# Patient Record
Sex: Female | Born: 1945
Health system: Southern US, Community
[De-identification: ages and names within clinical notes are randomized; demographics above are authoritative.]

## PROBLEM LIST (undated history)

## (undated) DIAGNOSIS — E538 Deficiency of other specified B group vitamins: Secondary | ICD-10-CM

## (undated) DIAGNOSIS — J449 Chronic obstructive pulmonary disease, unspecified: Secondary | ICD-10-CM

## (undated) DIAGNOSIS — F329 Major depressive disorder, single episode, unspecified: Secondary | ICD-10-CM

## (undated) DIAGNOSIS — M810 Age-related osteoporosis without current pathological fracture: Secondary | ICD-10-CM

## (undated) DIAGNOSIS — R0789 Other chest pain: Secondary | ICD-10-CM

## (undated) DIAGNOSIS — M545 Low back pain, unspecified: Secondary | ICD-10-CM

## (undated) DIAGNOSIS — H3530 Unspecified macular degeneration: Secondary | ICD-10-CM

## (undated) DIAGNOSIS — R413 Other amnesia: Secondary | ICD-10-CM

## (undated) DIAGNOSIS — K449 Diaphragmatic hernia without obstruction or gangrene: Secondary | ICD-10-CM

## (undated) DIAGNOSIS — F32A Depression, unspecified: Secondary | ICD-10-CM

## (undated) HISTORY — DX: Low back pain: M54.5

## (undated) HISTORY — DX: Deficiency of other specified B group vitamins: E53.8

## (undated) HISTORY — DX: Unspecified macular degeneration: H35.30

## (undated) HISTORY — DX: Age-related osteoporosis without current pathological fracture: M81.0

## (undated) HISTORY — DX: Other amnesia: R41.3

## (undated) HISTORY — PX: ABDOMINAL HYSTERECTOMY: SHX81

## (undated) HISTORY — DX: Chronic obstructive pulmonary disease, unspecified: J44.9

## (undated) HISTORY — DX: Low back pain, unspecified: M54.50

---

## 1997-12-30 ENCOUNTER — Other Ambulatory Visit: Admission: RE | Admit: 1997-12-30 | Discharge: 1997-12-30 | Payer: Self-pay | Admitting: Internal Medicine

## 1998-09-23 ENCOUNTER — Encounter: Payer: Self-pay | Admitting: Emergency Medicine

## 1998-09-23 ENCOUNTER — Emergency Department (HOSPITAL_COMMUNITY): Admission: EM | Admit: 1998-09-23 | Discharge: 1998-09-23 | Payer: Self-pay | Admitting: Emergency Medicine

## 1998-10-26 ENCOUNTER — Encounter: Payer: Self-pay | Admitting: Internal Medicine

## 1998-10-26 ENCOUNTER — Ambulatory Visit (HOSPITAL_COMMUNITY): Admission: RE | Admit: 1998-10-26 | Discharge: 1998-10-26 | Payer: Self-pay | Admitting: Internal Medicine

## 1998-10-29 ENCOUNTER — Ambulatory Visit (HOSPITAL_COMMUNITY): Admission: RE | Admit: 1998-10-29 | Discharge: 1998-10-29 | Payer: Self-pay | Admitting: Internal Medicine

## 1999-12-15 ENCOUNTER — Encounter: Payer: Self-pay | Admitting: Internal Medicine

## 1999-12-15 ENCOUNTER — Ambulatory Visit (HOSPITAL_COMMUNITY): Admission: RE | Admit: 1999-12-15 | Discharge: 1999-12-15 | Payer: Self-pay | Admitting: Internal Medicine

## 2000-04-16 ENCOUNTER — Emergency Department (HOSPITAL_COMMUNITY): Admission: EM | Admit: 2000-04-16 | Discharge: 2000-04-16 | Payer: Self-pay | Admitting: Emergency Medicine

## 2002-09-08 ENCOUNTER — Ambulatory Visit (HOSPITAL_COMMUNITY): Admission: RE | Admit: 2002-09-08 | Discharge: 2002-09-08 | Payer: Self-pay | Admitting: Family Medicine

## 2002-09-08 ENCOUNTER — Encounter: Payer: Self-pay | Admitting: Family Medicine

## 2002-09-09 ENCOUNTER — Ambulatory Visit (HOSPITAL_COMMUNITY): Admission: RE | Admit: 2002-09-09 | Discharge: 2002-09-09 | Payer: Self-pay | Admitting: Family Medicine

## 2002-09-09 ENCOUNTER — Encounter: Payer: Self-pay | Admitting: Family Medicine

## 2002-09-11 ENCOUNTER — Encounter: Payer: Self-pay | Admitting: Family Medicine

## 2002-09-11 ENCOUNTER — Ambulatory Visit (HOSPITAL_COMMUNITY): Admission: RE | Admit: 2002-09-11 | Discharge: 2002-09-11 | Payer: Self-pay | Admitting: Family Medicine

## 2003-11-25 ENCOUNTER — Ambulatory Visit (HOSPITAL_COMMUNITY): Admission: RE | Admit: 2003-11-25 | Discharge: 2003-11-25 | Payer: Self-pay | Admitting: Family Medicine

## 2005-05-10 ENCOUNTER — Ambulatory Visit (HOSPITAL_COMMUNITY): Admission: RE | Admit: 2005-05-10 | Discharge: 2005-05-10 | Payer: Self-pay | Admitting: Internal Medicine

## 2007-11-22 ENCOUNTER — Ambulatory Visit (HOSPITAL_COMMUNITY): Admission: RE | Admit: 2007-11-22 | Discharge: 2007-11-22 | Payer: Self-pay | Admitting: *Deleted

## 2010-05-01 ENCOUNTER — Encounter (HOSPITAL_BASED_OUTPATIENT_CLINIC_OR_DEPARTMENT_OTHER): Payer: Self-pay | Admitting: Internal Medicine

## 2011-01-30 ENCOUNTER — Encounter (INDEPENDENT_AMBULATORY_CARE_PROVIDER_SITE_OTHER): Payer: Self-pay | Admitting: Ophthalmology

## 2011-05-11 ENCOUNTER — Encounter (INDEPENDENT_AMBULATORY_CARE_PROVIDER_SITE_OTHER): Payer: 59 | Admitting: Ophthalmology

## 2011-05-11 DIAGNOSIS — H2510 Age-related nuclear cataract, unspecified eye: Secondary | ICD-10-CM

## 2011-05-11 DIAGNOSIS — H3530 Unspecified macular degeneration: Secondary | ICD-10-CM

## 2011-05-11 DIAGNOSIS — H43819 Vitreous degeneration, unspecified eye: Secondary | ICD-10-CM

## 2011-12-21 ENCOUNTER — Emergency Department (HOSPITAL_COMMUNITY): Payer: 59

## 2011-12-21 ENCOUNTER — Encounter (HOSPITAL_COMMUNITY): Payer: Self-pay | Admitting: Emergency Medicine

## 2011-12-21 ENCOUNTER — Emergency Department (HOSPITAL_COMMUNITY)
Admission: EM | Admit: 2011-12-21 | Discharge: 2011-12-22 | Disposition: A | Discharge status: Home / Self Care | Payer: 59 | Attending: Emergency Medicine | Admitting: Emergency Medicine

## 2011-12-21 DIAGNOSIS — F329 Major depressive disorder, single episode, unspecified: Secondary | ICD-10-CM | POA: Insufficient documentation

## 2011-12-21 DIAGNOSIS — F3289 Other specified depressive episodes: Secondary | ICD-10-CM | POA: Insufficient documentation

## 2011-12-21 DIAGNOSIS — F17200 Nicotine dependence, unspecified, uncomplicated: Secondary | ICD-10-CM

## 2011-12-21 DIAGNOSIS — R079 Chest pain, unspecified: Secondary | ICD-10-CM

## 2011-12-21 DIAGNOSIS — I2510 Atherosclerotic heart disease of native coronary artery without angina pectoris: Secondary | ICD-10-CM | POA: Insufficient documentation

## 2011-12-21 HISTORY — DX: Other chest pain: R07.89

## 2011-12-21 HISTORY — DX: Diaphragmatic hernia without obstruction or gangrene: K44.9

## 2011-12-21 HISTORY — DX: Depression, unspecified: F32.A

## 2011-12-21 HISTORY — DX: Major depressive disorder, single episode, unspecified: F32.9

## 2011-12-21 LAB — POCT I-STAT TROPONIN I
Troponin i, poc: 0 ng/mL (ref 0.00–0.08)
Troponin i, poc: 0 ng/mL (ref 0.00–0.08)

## 2011-12-21 LAB — BASIC METABOLIC PANEL
Calcium: 9.4 mg/dL (ref 8.4–10.5)
GFR calc Af Amer: 82 mL/min — ABNORMAL LOW (ref >90)
Potassium: 4 mEq/L (ref 3.5–5.1)

## 2011-12-21 LAB — CBC
HCT: 35.5 % — ABNORMAL LOW (ref 36.0–46.0)
MCV: 98.6 fL (ref 78.0–100.0)
Platelets: 158 10*3/uL (ref 150–400)
WBC: 7.1 10*3/uL (ref 4.0–10.5)

## 2011-12-21 MED ORDER — ASPIRIN 325 MG PO TABS
325.0000 mg | ORAL_TABLET | ORAL | Status: AC
  Administered 2011-12-21: 325 mg via ORAL
  Filled 2011-12-21: qty 1

## 2011-12-21 MED ORDER — NICOTINE 14 MG/24HR TD PT24
14.0000 mg | MEDICATED_PATCH | Freq: Once | TRANSDERMAL | Status: DC
  Filled 2011-12-21: qty 1

## 2011-12-21 NOTE — ED Notes (Signed)
Reports got up this AM and had sharp pain in midsternum, and has been having episodes since--describes it as pressure; reports a "little bit" of nausea, SOB; lungs clear

## 2011-12-21 NOTE — ED Notes (Addendum)
Report called to CDU RN Damon. Pt ambulated to CDU #7 without assistance but with supervision. Gait steady. Pt remains pain free.

## 2011-12-21 NOTE — ED Provider Notes (Signed)
66 year old female had 2 episodes of chest pain today. Both were sharp and relatively brief. There is no radiation and no associated dyspnea, nausea, diaphoresis. She does have significant cardiac risk factors of cigarette smoking and strongly positive family history of coronary artery disease. Exam is unremarkable. She is clear lungs and heart has regular rate and rhythm without murmur. ECG shows some nonspecific ST depression, but no prior ECG is available for comparison. Because of her risk factors and abnormal ECG, I feel that she should be admitted to CDU to get a coronary CT scan done in the morning. Patient is agreeable.   Date: 12/21/2011  Rate: 94  Rhythm: normal sinus rhythm and premature ventricular contractions (PVC)  QRS Axis: normal  Intervals: normal  ST/T Wave abnormalities: ST depression in the inferior and anterolateral leads  Conduction Disutrbances:none  Narrative Interpretation: ST depression in the inferior and anterolateral leads. No prior ECG available for comparison.  Old EKG Reviewed: none available    Dione Booze, MD 12/21/11 2315

## 2011-12-21 NOTE — ED Provider Notes (Addendum)
History     CSN: [id number]  Arrival date & time 12/21/11  1613   None     Chief Complaint  Patient presents with  . Chest Pain    (Consider location/radiation/quality/duration/timing/severity/associated sxs/prior treatment) HPI  66 year old female who presents with acute onset chest pain that started this morning at approximately 8:30 AM when she was awakening. She describes it as sharp and centrally located. She initially thought it was indigestion. It was severe for matter of a few seconds and then eased off spontaneously. Thereafter, she had central chest pressure that persisted and she rated it a 2/10. It was not associated with nausea vomiting shortness of breath or diaphoresis. She went about her day doing chores around the house including sweeping, laundry, and cooking which did not exacerbate the pain. She said the pain came back briefly this afternoon, so she called her son-in-law who is a Company secretary for his opinion.he checked her blood pressure and heart was elevated and she need to get an EEG for further evaluation. The patient has not been seen by a physician in about a year and a half. She denies a history of coronary artery disease or heart attack, but notes that she was sent for a stress test before. She does not remember the results of the stress test. She denies a history of hypertension, hyperlipidemia, and diabetes. She does have a greater than 50-pack-year history of smoking and 4 siblings with coronary artery disease, but none with an early MI. The only medications that she takes on a daily basis are Celexa and BC powder. She takes 3-4 BC powders a day.  Past Medical History  Diagnosis Date  . Depression   . Hiatal hernia     Past Surgical History  Procedure Date  . Abdominal hysterectomy     History reviewed. No pertinent family history.  History  Substance Use Topics  . Smoking status: Current Every Day Smoker -- 0.5 packs/day    Types: Cigarettes  . Smokeless  tobacco: Not on file  . Alcohol Use: Yes     rarely    OB History    Grav Para Term Preterm Abortions TAB SAB Ect Mult Living                  Review of Systems  Constitutional: Negative.   HENT: Negative.   Eyes: Negative.   Respiratory: Negative.  Negative for shortness of breath.   Cardiovascular: Positive for chest pain.  Gastrointestinal: Negative.   Genitourinary: Negative.   Musculoskeletal: Negative.   Neurological: Negative.     Allergies  Codeine  Home Medications   Current Outpatient Rx  Name Route Sig Dispense Refill  . BC HEADACHE POWDER PO Oral Take 1 Package by mouth daily.    Marland Kitchen CITALOPRAM HYDROBROMIDE 20 MG PO TABS Oral Take 60 mg by mouth daily.      BP 120/72  Pulse 65  Temp 97.8 F (36.6 C) (Oral)  Resp 25  SpO2 99%  Physical Exam  Constitutional: She is oriented to person, place, and time. She appears well-developed and well-nourished. No distress.       Thin body habitus, appears older than stated age  HENT:  Head: Normocephalic and atraumatic.  Mouth/Throat: No oropharyngeal exudate.  Eyes: Conjunctivae normal are normal. Pupils are equal, round, and reactive to light.  Neck: Normal range of motion.  Cardiovascular: Normal rate, regular rhythm and normal heart sounds.   Pulmonary/Chest: No respiratory distress. She has wheezes. She exhibits no  tenderness.  Abdominal: Soft. Bowel sounds are normal.  Neurological: She is alert and oriented to person, place, and time. She has normal reflexes.  Skin: Skin is warm and dry. She is not diaphoretic.  Psychiatric: She has a normal mood and affect. Her behavior is normal.    ED Course  Procedures (including critical care time)  Labs Reviewed  CBC - Abnormal; Notable for the following:    RBC 3.60 (*)     Hemoglobin 11.9 (*)     HCT 35.5 (*)     All other components within normal limits  BASIC METABOLIC PANEL - Abnormal; Notable for the following:    GFR calc non Af Amer 71 (*)     GFR  calc Af Amer 82 (*)     All other components within normal limits  POCT I-STAT TROPONIN I  TROPONIN I   Dg Chest 2 View  12/21/2011  *RADIOLOGY REPORT*  Clinical Data: Chest pain off and on for 3 weeks.  History of smoking.  CHEST - 2 VIEW  Comparison: 05/10/2005  Findings: Lungs are hyperinflated.  There is perihilar peribronchial thickening.  No focal consolidations or pleural effusions are identified. There is biapical pleural parenchymal scarring.  Mild degenerative changes are seen in the spine.  IMPRESSION: Hyperinflation and bronchitic changes.   Original Report Authenticated By: Patterson Hammersmith, M.D.     Date: 12/21/2011  Rate: 66   Rhythm: normal sinus rhythm  QRS Axis: normal  Intervals: normal  ST/T Wave abnormalities: ST depressions diffusely  Conduction Disutrbances:none  Narrative Interpretation: Abnormal ECG with non specific ST changes  Old EKG Reviewed: none available     1. Chest pain       MDM  66 year old F who presents with atypical chest pain and risk factors for CAD including a positive family history and a 50 pack year history of smoking with COPD who needs further work up for CAD. Therefore, she will be transferred to the CDU for further evaluation.         Garnetta Buddy, MD 12/21/11 [phone]

## 2011-12-22 ENCOUNTER — Emergency Department (HOSPITAL_COMMUNITY): Payer: 59

## 2011-12-22 ENCOUNTER — Encounter (HOSPITAL_COMMUNITY): Payer: Self-pay | Admitting: Nurse Practitioner

## 2011-12-22 DIAGNOSIS — R079 Chest pain, unspecified: Secondary | ICD-10-CM

## 2011-12-22 MED ORDER — METOPROLOL TARTRATE 25 MG PO TABS
50.0000 mg | ORAL_TABLET | Freq: Once | ORAL | Status: AC
  Administered 2011-12-22: 50 mg via ORAL
  Filled 2011-12-22: qty 2

## 2011-12-22 MED ORDER — IOHEXOL 350 MG/ML SOLN
80.0000 mL | Freq: Once | INTRAVENOUS | Status: AC | PRN
  Administered 2011-12-22: 80 mL via INTRAVENOUS

## 2011-12-22 NOTE — ED Provider Notes (Signed)
I saw and evaluated the patient, reviewed the resident's note and I agree with the findings and plan.                      s  Dione Booze, MD 12/22/11 [phone]

## 2011-12-22 NOTE — ED Notes (Signed)
Per verbal order by Dr. Charlett Nose, pt given metoprolol 5mg  IVP and NTG 0.4mg  SL.

## 2011-12-22 NOTE — ED Provider Notes (Signed)
66 year old female current smoker with a strong family history of CAD transferred to the CDU on chest pain protocol.  Patient reports brief history of intermittent sharp chest pain that occurred earlier yesterday and states she is currently CP free. ECG was positive for ST depression in the inferior and anteriorlateral leads, negative chest x-ray, negative troponin x2.  Patient disposition pending results of CT angiogram.   12:53 PM  CTAngio CHEST IMPRESSION: 1. Positive for coronary artery disease. The patient's total coronary artery calcium score is 103.3, which is 80th percentile for patient's matched age and gender. 2. Moderate calcified plaque in the proximal to mid LAD with less than 50% luminal narrowing. Focal mixed plaque at the origin of the RCA appears more significant, with luminal narrowing 50-75%, likely closer to 50%. 3. Right coronary artery dominance.  Consult Cardiology for dispo plan.   1:40 PM Per Cardiologist pt will be dc w PCP follow up. Atypical chest discomfort, mild ST changes, and dx of stable CAD on CTAngio.  Pt has been advised start a PPI and return to the ED is CP becomes exertional, associated with diaphoresis or nausea, radiates to left jaw/arm, worsens or becomes concerning in any way. Pt appears reliable for follow up and is agreeable to discharge.   Case has been discussed with and seen by Dr. Daleen Squibb who agrees with the above plan to discharge.     Jaci Carrel, [place] 12/22/11 1346

## 2011-12-22 NOTE — Progress Notes (Signed)
Patient ID: Laurie James, female   DOB: 07/06/1945, 66 y.o.   MRN: [id number]   CARDIOLOGY CONSULT NOTE  Patient ID: Laurie James MRN: [id number], DOB/AGE: Nov 06, 1945   Admit date: 12/21/2011 Date of Consult: 12/22/2011   Primary Physician: Sallye Ober MD Primary Cardiologist: none  Pt. Profile  Laurie James is a 65 year old white female that I was asked to see for substernal chest pain.  Problem List  Past Medical History  Diagnosis Date  . Depression   . Hiatal hernia   . Midsternal chest pain     a. 12/2011 Cardiac CTA Ca++ score of 103.3 (80th %), LAD <50p/m, RCA 50-75.    Past Surgical History  Procedure Date  . Abdominal hysterectomy      Allergies  Allergies  Allergen Reactions  . Codeine Nausea And Vomiting    HPI  She began to have sharp localized midsternal chest discomfort yesterday. Her last episode of pain which just today around 2:00. She was advised by the fire department to come to the emergency room.  She does have dyspnea on exertion with household chores. She denies any angina. Discomfort she had did not radiate, was not associated with any other symptoms such as nausea or vomiting. It was made worse by bending over. She denies any dysphagia. She denies any hematemesis or melena.  EKG showed nonspecific ST segment changes inferior laterally which I read as normal. Troponin was negative. Cardiac CT demonstrated a 50% stenosis and a dominant right coronary artery and a 50% mid LAD stenosis. Coronary calcification score was  103.   Cardiac risk factors include age, sex, 55-pack-year history of smoking since age 43. She smokes about a half-pack to pack a day depending on her stress level. She has never tried to quit smoking. Her daughter quit with Chantix.  She denies orthopnea, PND or edema.    Inpatient Medications     . aspirin  325 mg Oral STAT  . metoprolol tartrate  50 mg Oral Once  . nicotine  14 mg Transdermal Once    Family  History History reviewed. No pertinent family history.   Social History History   Social History  . Marital Status: Married    Spouse Name: N/A    Number of Children: N/A  . Years of Education: N/A   Occupational History  . Not on file.   Social History Main Topics  . Smoking status: Current Every Day Smoker -- 0.5 packs/day    Types: Cigarettes  . Smokeless tobacco: Not on file  . Alcohol Use: Yes     rarely  . Drug Use: No  . Sexually Active:    Other Topics Concern  . Not on file   Social History Narrative  . No narrative on file     Review of Systems  General:  No chills, fever, night sweats or weight changes.  Cardiovascular:  , dyspnea on exertion, edema, orthopnea, palpitations, paroxysmal nocturnal dyspnea. Dermatological: No rash, lesions/masses Respiratory: No cough, dyspnea Urologic: No hematuria, dysuria Abdominal:   No nausea, vomiting, diarrhea, bright red blood per rectum, melena, or hematemesis Neurologic:  No visual changes, wkns, changes in mental status. All other systems reviewed and are otherwise negative except as noted above.  Physical Exam  Blood pressure 100/62, pulse 59, temperature 98.7 F (37.1 C), temperature source Oral, resp. rate 18, height 5' (1.524 m), weight 129 lb (58.514 kg), SpO2 98.00%.  General: Pleasant, NAD Psych: Normal affect. Neuro: Alert and oriented X 3.  Moves all extremities spontaneously. HEENT: Normal  Neck: Supple without bruits or JVD. Lungs:  Resp regular and unlabored, CTA. Heart: RRR no s3, s4, or murmurs. Abdomen: Soft, non-tender, non-distended, BS + x 4.  Extremities: No clubbing, cyanosis or edema.diminished in the lower extremities but present. Delayed capillary refill. and equal bilaterally.  Labs  No results found for this basename: CKTOTAL:4,CKMB:4,TROPONINI:4 in the last 72 hours Lab Results  Component Value Date   WBC 7.1 12/21/2011   HGB 11.9* 12/21/2011   HCT 35.5* 12/21/2011   MCV 98.6  12/21/2011   PLT 158 12/21/2011    Lab 12/21/11 1626  NA 141  K 4.0  CL 107  CO2 22  BUN 15  CREATININE 0.84  CALCIUM 9.4  PROT --  BILITOT --  ALKPHOS --  ALT --  AST --  GLUCOSE 93   No results found for this basename: CHOL, HDL, LDLCALC, TRIG   No results found for this basename: DDIMER    Radiology/Studies  Dg Chest 2 View  12/21/2011  *RADIOLOGY REPORT*  Clinical Data: Chest pain off and on for 3 weeks.  History of smoking.  CHEST - 2 VIEW  Comparison: 05/10/2005  Findings: Lungs are hyperinflated.  There is perihilar peribronchial thickening.  No focal consolidations or pleural effusions are identified. There is biapical pleural parenchymal scarring.  Mild degenerative changes are seen in the spine.  IMPRESSION: Hyperinflation and bronchitic changes.   Original Report Authenticated By: Patterson Hammersmith, M.D.    Ct Heart Morp W/cta Cor W/score W/ca W/cm &/or Wo/cm  12/22/2011  *RADIOLOGY REPORT*  INDICATION:  There are midsternal pain.  CT ANGIOGRAPHY OF THE HEART, CORONARY ARTERY, STRUCTURE, AND MORPHOLOGY  CONTRAST: 80mL OMNIPAQUE IOHEXOL 350 MG/ML SOLN  COMPARISON:  None.  TECHNIQUE:  CT angiography of the coronary vessels was performed on a 256 channel system using prospective ECG gating.  A scout and noncontrast exam (for calcium scoring) were performed.  Circulation time was measured using a test bolus.  Coronary CTA was performed with sub mm slice collimation during portions of the cardiac cycle after prior injection of iodinated contrast.  Imaging post processing was performed on an independent workstation creating multiplanar and 3-D images, and quantitative analysis of the heart and coronary arteries.  Note that this exam targets the heart and the chest was not imaged in its entirety.  PREMEDICATION: Lopressor 50 mg, P.O. Lopressor 5 mg, IV Nitroglycerin 0.4 mcg, sublingual.  FINDINGS: Technical quality:  Excellent.  Heart rate:  54  CORONARY ARTERIES: Left main coronary  artery:  Normal caliber vessel.  No intrinsic disease. Left anterior descending:  Moderate calcified plaque at the origin and beyond the origin of first diagonal branch. This appears to be narrowing the lumen less than 50%. Short intramyocardial course of the LAD within its mid to distal segment. Left circumflex:  Gives rise to a proximal ramus branch.  No intrinsic disease or stenosis. Right coronary artery:  Moderate focal mixed plaque at the origin. This narrows the lumen approximately 50% in diameter.  Otherwise widely patent. Posterior descending artery:  Widely patent. Dominance:  Right  CORONARY CALCIUM: Total Agatston Score:  103.3 MESA database percentile:  80th  CARDIAC MEASUREMENTS: Interventricular septum (6 - 12 mm):  8 mm LV posterior Merced Brougham (6 - 12 mm):  9 mm LV diameter in diastole (35 - 52 mm):  39 mm  AORTA AND PULMONARY MEASUREMENTS: Aortic root (21 - 40 mm):  21 mm  at the annulus             30 mm  at the sinuses of Valsalva             27 mm  at the sinotubular junction Ascending aorta ( <  40 mm):  29 mm Descending aorta ( <  40 mm):  22 mm Main pulmonary artery:  ( <  30 mm):  23 mm  EXTRACARDIAC FINDINGS: Changes of mild peripheral interstitial prominence, likely early fibrosis.  Scarring in the lingula and right middle lobe.  This can be seen with old Mycobacterium avium infections.  No effusions or acute findings.  No adenopathy lesion in the visualized lower mediastinum or hila.  IMPRESSION:  1.  Positive for coronary artery disease.  The patient's total coronary artery calcium score is 103.3, which is 80th percentile for patient's matched age and gender. 2.  Moderate calcified plaque in the proximal to mid LAD with less than 50% luminal narrowing.  Focal mixed plaque at the origin of the RCA appears more significant, with luminal narrowing 50-75%, likely closer to 50%. 3.  Right coronary artery dominance.  Report was called to Lizette at 12:40 p.m. on 12/22/2011.   Original  Report Authenticated By: Cyndie Chime, M.D.     ECG  Normal sinus rhythm, essentially normal EKG  ASSESSMENT AND PLAN  66 year old white female with clinically occult coronary artery disease. As I discussed with her daughter, it is not surprising that she has coronary disease to this degree considering her risk factors particularly smoking. Her symptoms are clearly not coronary. I suspect she has some reflux.  I spent 30 minutes challenging her and counseled her to stop smoking. I recommended that she follow Dr. Nedra Hai, her primary care physician, for fasting lipids and most likely the need for a statin with an LDL goal of 70 or less. In addition I've advised an enteric-coated aspirin 81 mg a day. I've asked her to ask her primary care physician for Chantix if she decides to stop smoking. Her daughter says she'll reinforce this.  There is no need for cardiology followup. I do not think a stress Myoview or any other functional study will be of any benefit. There is also no clinical indication for this.   Signed, Valera Castle, MD 12/22/2011, 1:42 PM

## 2011-12-22 NOTE — ED Notes (Signed)
Pt transported to CT scanner via wheelchair with RN.

## 2011-12-22 NOTE — ED Notes (Signed)
Family at bedside. 

## 2011-12-24 NOTE — ED Provider Notes (Signed)
Medical screening examination/treatment/procedure(s) were performed by non-physician practitioner and as supervising physician I was immediately available for consultation/collaboration.   Laray Anger, DO 12/24/11 1723

## 2013-02-28 ENCOUNTER — Other Ambulatory Visit (HOSPITAL_COMMUNITY): Payer: Self-pay | Admitting: Internal Medicine

## 2013-02-28 DIAGNOSIS — Z1231 Encounter for screening mammogram for malignant neoplasm of breast: Secondary | ICD-10-CM

## 2013-03-18 ENCOUNTER — Ambulatory Visit (HOSPITAL_COMMUNITY)
Admission: RE | Admit: 2013-03-18 | Discharge: 2013-03-18 | Disposition: A | Discharge status: Home / Self Care | Payer: 59 | Source: Ambulatory Visit | Attending: Internal Medicine | Admitting: Internal Medicine

## 2013-03-18 DIAGNOSIS — Z1231 Encounter for screening mammogram for malignant neoplasm of breast: Secondary | ICD-10-CM

## 2013-08-04 ENCOUNTER — Other Ambulatory Visit: Payer: Self-pay | Admitting: Internal Medicine

## 2013-08-04 ENCOUNTER — Ambulatory Visit
Admission: RE | Admit: 2013-08-04 | Discharge: 2013-08-04 | Disposition: A | Discharge status: Home / Self Care | Payer: 59 | Source: Ambulatory Visit | Attending: Internal Medicine | Admitting: Internal Medicine

## 2013-08-04 DIAGNOSIS — R0789 Other chest pain: Secondary | ICD-10-CM

## 2013-08-04 DIAGNOSIS — R42 Dizziness and giddiness: Secondary | ICD-10-CM

## 2013-08-06 ENCOUNTER — Other Ambulatory Visit: Payer: 59

## 2013-08-06 ENCOUNTER — Ambulatory Visit
Admission: RE | Admit: 2013-08-06 | Discharge: 2013-08-06 | Disposition: A | Discharge status: Home / Self Care | Payer: 59 | Source: Ambulatory Visit | Attending: Internal Medicine | Admitting: Internal Medicine

## 2013-08-06 ENCOUNTER — Other Ambulatory Visit: Payer: Self-pay | Admitting: Internal Medicine

## 2013-08-06 DIAGNOSIS — R42 Dizziness and giddiness: Secondary | ICD-10-CM

## 2013-12-04 ENCOUNTER — Encounter (INDEPENDENT_AMBULATORY_CARE_PROVIDER_SITE_OTHER): Payer: 59 | Admitting: Ophthalmology

## 2013-12-04 DIAGNOSIS — H2510 Age-related nuclear cataract, unspecified eye: Secondary | ICD-10-CM

## 2013-12-04 DIAGNOSIS — H43819 Vitreous degeneration, unspecified eye: Secondary | ICD-10-CM

## 2013-12-04 DIAGNOSIS — H353290 Exudative age-related macular degeneration, unspecified eye, stage unspecified: Secondary | ICD-10-CM

## 2014-02-04 ENCOUNTER — Ambulatory Visit: Payer: 59 | Admitting: Cardiovascular Disease

## 2014-03-02 ENCOUNTER — Ambulatory Visit: Payer: 59 | Admitting: Cardiovascular Disease

## 2014-04-22 NOTE — Progress Notes (Signed)
Patient ID: Laurie James, female   DOB: [birth year], 69 y.o.   MRN: [id number]    69 yo referred by Dr Leticia Clas for CAD   EXTRACARDIAC FINDINGS:  Cardiac CT 9/13   Changes of mild peripheral interstitial prominence, likely early fibrosis. Scarring in the lingula and right middle lobe. This can be seen with old Mycobacterium avium infections. No effusions or acute findings. No adenopathy lesion in the visualized lower mediastinum or hila.  IMPRESSION:  1. Positive for coronary artery disease. The patient's total coronary artery calcium score is 103.3, which is 80th percentile for patient's matched age and gender. 2. Moderate calcified plaque in the proximal to mid LAD with less than 50% luminal narrowing. Focal mixed plaque at the origin of the RCA appears more significant, with luminal narrowing 50-75%, likely closer to 50%. 3. Right coronary artery dominance.  Still smoking  Has some pressure and dyspnea in her chest with activity No rest pain.  Mild cough   ROS: Denies fever, malais, weight loss, blurry vision, decreased visual acuity, cough, sputum, SOB, hemoptysis, pleuritic pain, palpitaitons, heartburn, abdominal pain, melena, lower extremity edema, claudication, or rash.  All other systems reviewed and negative   General: Affect appropriate Chronically ill thin bronchitic female  HEENT: normal Neck supple with no adenopathy JVP normal no bruits no thyromegaly Lungs clear with no wheezing and good diaphragmatic motion Heart:  S1/S2 no murmur,rub, gallop or click PMI normal Abdomen: benighn, BS positve, no tenderness, no AAA no bruit.  No HSM or HJR Distal pulses intact with no bruits No edema Neuro non-focal Skin warm and dry No muscular weakness  Medications Current Outpatient Prescriptions  Medication Sig Dispense Refill  . Aspirin-Salicylamide-Caffeine (BC HEADACHE POWDER PO) Take 1 Package by mouth daily.    . citalopram (CELEXA) 20 MG tablet Take  60 mg by mouth daily.     No current facility-administered medications for this visit.    Allergies Codeine  Family History: No family history on file.  Social History: History   Social History  . Marital Status: Married    Spouse Name: N/A    Number of Children: N/A  . Years of Education: N/A   Occupational History  . Not on file.   Social History Main Topics  . Smoking status: Current Every Day Smoker -- 0.50 packs/day    Types: Cigarettes  . Smokeless tobacco: Not on file  . Alcohol Use: Yes     Comment: rarely  . Drug Use: No  . Sexual Activity: Not on file   Other Topics Concern  . Not on file   Social History Narrative    Past Surgical History  Procedure Laterality Date  . Abdominal hysterectomy      Past Medical History  Diagnosis Date  . Depression   . Hiatal hernia   . Midsternal chest pain     a. 12/2011 Cardiac CTA Ca++ score of 103.3 (80th %), LAD <50p/m, RCA 50-75.    Electrocardiogram:  12/23/11  SR rate 94 nonspecific ST changes   Assessment and Plan

## 2014-04-23 ENCOUNTER — Encounter: Payer: Self-pay | Admitting: *Deleted

## 2014-04-24 ENCOUNTER — Ambulatory Visit (INDEPENDENT_AMBULATORY_CARE_PROVIDER_SITE_OTHER): Payer: 59 | Admitting: Cardiovascular Disease

## 2014-04-24 ENCOUNTER — Encounter: Payer: Self-pay | Admitting: Cardiovascular Disease

## 2014-04-24 VITALS — BP 116/78 | HR 72 | Ht 60.0 in | Wt 112.0 lb

## 2014-04-24 DIAGNOSIS — R0789 Other chest pain: Secondary | ICD-10-CM

## 2014-04-24 DIAGNOSIS — J410 Simple chronic bronchitis: Secondary | ICD-10-CM

## 2014-04-24 DIAGNOSIS — I2510 Atherosclerotic heart disease of native coronary artery without angina pectoris: Secondary | ICD-10-CM | POA: Insufficient documentation

## 2014-04-24 DIAGNOSIS — I2583 Coronary atherosclerosis due to lipid rich plaque: Secondary | ICD-10-CM

## 2014-04-24 DIAGNOSIS — J449 Chronic obstructive pulmonary disease, unspecified: Secondary | ICD-10-CM | POA: Insufficient documentation

## 2014-04-24 DIAGNOSIS — J441 Chronic obstructive pulmonary disease with (acute) exacerbation: Secondary | ICD-10-CM | POA: Insufficient documentation

## 2014-04-24 NOTE — Assessment & Plan Note (Signed)
Needs functional study given known moderate 2V CAD and exertional symptoms  Low threshold to cath  ASA for now  [facility] as she cannot walk on treadmill

## 2014-04-24 NOTE — Assessment & Plan Note (Signed)
F/U primary may benefit from walk test and pulmonary referral  No active wheezing

## 2014-04-24 NOTE — Patient Instructions (Signed)
Your physician wants you to follow-up in:   6 MONTHS WITH DR NISHAN' You will receive a reminder letter in the mail two months in advance. If you don't receive a letter, please call our office to schedule the follow-up appointment. Your physician recommends that you continue on your current medications as directed. Please refer to the Current Medication list given to you today.   Your physician has requested that you have a lexiscan myoview. For further information please visit [email or web address]. Please follow instruction sheet, as given.  

## 2014-04-28 ENCOUNTER — Ambulatory Visit (HOSPITAL_COMMUNITY): Payer: 59 | Attending: Cardiology | Admitting: Radiology

## 2014-04-28 DIAGNOSIS — Z720 Tobacco use: Secondary | ICD-10-CM | POA: Diagnosis not present

## 2014-04-28 DIAGNOSIS — J449 Chronic obstructive pulmonary disease, unspecified: Secondary | ICD-10-CM | POA: Insufficient documentation

## 2014-04-28 DIAGNOSIS — R0609 Other forms of dyspnea: Secondary | ICD-10-CM | POA: Diagnosis not present

## 2014-04-28 DIAGNOSIS — R079 Chest pain, unspecified: Secondary | ICD-10-CM | POA: Insufficient documentation

## 2014-04-28 DIAGNOSIS — I2510 Atherosclerotic heart disease of native coronary artery without angina pectoris: Secondary | ICD-10-CM | POA: Diagnosis not present

## 2014-04-28 DIAGNOSIS — R0789 Other chest pain: Secondary | ICD-10-CM

## 2014-04-28 MED ORDER — REGADENOSON 0.4 MG/5ML IV SOLN
0.4000 mg | Freq: Once | INTRAVENOUS | Status: AC
  Administered 2014-04-28: 0.4 mg via INTRAVENOUS

## 2014-04-28 MED ORDER — TECHNETIUM TC 99M SESTAMIBI GENERIC - CARDIOLITE
33.0000 | Freq: Once | INTRAVENOUS | Status: AC | PRN
  Administered 2014-04-28: 33 via INTRAVENOUS

## 2014-04-28 MED ORDER — TECHNETIUM TC 99M SESTAMIBI GENERIC - CARDIOLITE
11.0000 | Freq: Once | INTRAVENOUS | Status: AC | PRN
  Administered 2014-04-28: 11 via INTRAVENOUS

## 2014-04-28 NOTE — Progress Notes (Signed)
[facility] SITE 3 NUCLEAR MED [address] [place], [place] [address] [phone]    Cardiology Nuclear Med Study  Laurie James is a 69 y.o. female     MRN : [id number]     DOB: 1946/03/21  Procedure Date: 04/28/2014  Nuclear Med Background Indication for Stress Test:  Evaluation for Ischemia History:  CAD, COPD Cardiac Risk Factors: Smoker  Symptoms:  Chest Pain and DOE   Nuclear Pre-Procedure Caffeine/Decaff Intake:  None> 12 hrs NPO After: 8:30pm   Lungs:  clear O2 Sat: 97% on room air. IV 0.9% NS with Angio Cath:  20g  IV Site: R Antecubital x 1, tolerated well IV Started by:  Irean Hong, RN  Chest Size (in):  36 Cup Size: B  Height: 5' (1.524 m)  Weight:  107 lb (48.535 kg)  BMI:  Body mass index is 20.9 kg/(m^2). Tech Comments:  N/A    Nuclear Med Study 1 or 2 day study: 1 day  Stress Test Type:  Lexiscan  Reading MD: N/A  Order Authorizing Provider:  Charlton Haws, MD  Resting Radionuclide: Technetium 82m Sestamibi  Resting Radionuclide Dose: 11.0 mCi   Stress Radionuclide:  Technetium 50m Sestamibi  Stress Radionuclide Dose: 33.0 mCi           Stress Protocol Rest HR: 81 Stress HR: 115  Rest BP: 131/76 Stress BP: 115/66  Exercise Time (min): n/a METS: n/a   Predicted Max HR: 152 bpm % Max HR: 75.66 bpm Rate Pressure Product: 50518   Dose of Adenosine (mg):  n/a Dose of Lexiscan: 0.4 mg  Dose of Atropine (mg): n/a Dose of Dobutamine: n/a mcg/kg/min (at max HR)  Stress Test Technologist: Nelson Chimes, BS-ES  Nuclear Technologist:  Kerby Nora, CNMT     Rest Procedure:  Myocardial perfusion imaging was performed at rest 45 minutes following the intravenous administration of Technetium 38m Sestamibi. Rest ECG: NSR, short PR interval, nonspecific ST changes.  Stress Procedure:  The patient received IV Lexiscan 0.4 mg over 15-seconds.  Technetium 61m Sestamibi injected at 30-seconds.  Quantitative spect images were obtained after a 45  minute delay. During the infusion of Lexiscan the patient complained of stomach feeling funny.  These symptoms began to resolve in recovery.  Stress ECG: Borderline ST depression in the inferolateral leads felt to be nondiagnostic.  QPS Raw Data Images:  Normal; no motion artifact. Stress Images:  There is decreased uptake in the anterior wall. Rest Images:  There is decreased uptake in the anterior wall. Subtraction (SDS):  No evidence of ischemia. Transient Ischemic Dilatation (Normal <1.22):  1.10 Lung/Heart Ratio (Normal <0.45):  0.33  Quantitative Gated Spect Images QGS EDV:  47 ml QGS ESV:  16 ml  Impression Exercise Capacity:  Lexiscan with no exercise. BP Response:  Normal blood pressure response. Clinical Symptoms:  No chest pain or dyspnea. ECG Impression:  Borderline ST depression in the inferolateral leads felt to be nondiagnostic. Comparison with Prior Nuclear Study: No previous nuclear study performed  Overall Impression:  Low risk stress nuclear study with a small, mild, fixed anterior defect consistent with soft tissue attenuation; no ischemia.  LV Ejection Fraction: 67%.  LV Wall Motion:  NL LV Function; NL Wall Motion  [name]

## 2014-06-26 ENCOUNTER — Telehealth: Payer: Self-pay | Admitting: Internal Medicine

## 2014-06-30 NOTE — Telephone Encounter (Signed)
error 

## 2014-12-08 ENCOUNTER — Ambulatory Visit: Payer: 59 | Admitting: Cardiovascular Disease

## 2014-12-09 ENCOUNTER — Ambulatory Visit (INDEPENDENT_AMBULATORY_CARE_PROVIDER_SITE_OTHER): Payer: 59 | Admitting: Ophthalmology

## 2014-12-09 DIAGNOSIS — H3531 Nonexudative age-related macular degeneration: Secondary | ICD-10-CM | POA: Diagnosis not present

## 2014-12-09 DIAGNOSIS — H43813 Vitreous degeneration, bilateral: Secondary | ICD-10-CM | POA: Diagnosis not present

## 2015-02-14 NOTE — Progress Notes (Signed)
Patient ID: Laurie James, female   DOB: May 11, 1945, 69 y.o.   MRN: [id number]    69 yo referred by Dr Leticia Clas for CAD 04/2014   EXTRACARDIAC FINDINGS:  Cardiac CT 9/13   Changes of mild peripheral interstitial prominence, likely early fibrosis. Scarring in the lingula and right middle lobe. This can be seen with old Mycobacterium avium infections. No effusions or acute findings. No adenopathy lesion in the visualized lower mediastinum or hila.  IMPRESSION:  1. Positive for coronary artery disease. The patient's total coronary artery calcium score is 103.3, which is 80th percentile for patient's matched age and gender. 2. Moderate calcified plaque in the proximal to mid LAD with less than 50% luminal narrowing. Focal mixed plaque at the origin of the RCA appears more significant, with luminal narrowing 50-75%, likely closer to 50%. 3. Right coronary artery dominance.  Still smoking  Has some pressure and dyspnea in her chest with activity No rest pain.  Mild cough   F/U Myovue 04/28/14 was normal EF 67% no ischemia   ROS: Denies fever, malais, weight loss, blurry vision, decreased visual acuity, cough, sputum, SOB, hemoptysis, pleuritic pain, palpitaitons, heartburn, abdominal pain, melena, lower extremity edema, claudication, or rash.  All other systems reviewed and negative   General: Affect appropriate Chronically ill thin bronchitic female  HEENT: normal Neck supple with no adenopathy JVP normal no bruits no thyromegaly Lungs clear with no wheezing and good diaphragmatic motion Heart:  S1/S2 no murmur,rub, gallop or click PMI normal Abdomen: benighn, BS positve, no tenderness, no AAA no bruit.  No HSM or HJR Distal pulses intact with no bruits No edema Neuro non-focal Skin warm and dry No muscular weakness  Medications Current Outpatient Prescriptions  Medication Sig Dispense Refill  . ALPRAZolam (XANAX) 0.5 MG tablet Take 0.5 mg by mouth daily.  1  .  Aspirin-Salicylamide-Caffeine (BC HEADACHE POWDER PO) Take 1 Package by mouth daily.    . citalopram (CELEXA) 20 MG tablet Take 60 mg by mouth daily.    . citalopram (CELEXA) 40 MG tablet Take 40 mg by mouth daily.  6  . esomeprazole (NEXIUM) 40 MG capsule Take 40 mg by mouth daily at 12 noon.     No current facility-administered medications for this visit.    Allergies Codeine  Family History: No family history on file.  Social History: Social History   Social History  . Marital Status: Married    Spouse Name: N/A  . Number of Children: N/A  . Years of Education: N/A   Occupational History  . Not on file.   Social History Main Topics  . Smoking status: Current Every Day Smoker -- 1.00 packs/day for 50 years    Types: Cigarettes  . Smokeless tobacco: Not on file  . Alcohol Use: No     Comment: rarely  . Drug Use: No  . Sexual Activity: Not on file   Other Topics Concern  . Not on file   Social History Narrative    Past Surgical History  Procedure Laterality Date  . Abdominal hysterectomy      Past Medical History  Diagnosis Date  . Depression   . Hiatal hernia   . Midsternal chest pain     a. 12/2011 Cardiac CTA Ca++ score of 103.3 (80th %), LAD <50p/m, RCA 50-75.  . Vitamin B 12 deficiency   . Intermittent low back pain   . COPD (chronic obstructive pulmonary disease)   . Osteoporosis   . AMD (age  related macular degeneration)   . Memory impairment     Electrocardiogram:  12/23/11  SR rate 94 nonspecific ST changes   Assessment and Plan CAD:  Calcium score 103 80th percentile for age and sex.  Normal perfusion study continue aggressive risk factor modification  Smoking: Anxiety/Depression:  On xanax and celexa f/u primary    F/U with me in a year  Charlton Haws

## 2015-02-16 ENCOUNTER — Encounter: Payer: 59 | Admitting: Cardiovascular Disease

## 2015-02-16 DIAGNOSIS — R0989 Other specified symptoms and signs involving the circulatory and respiratory systems: Secondary | ICD-10-CM

## 2015-02-17 ENCOUNTER — Encounter: Payer: Self-pay | Admitting: Cardiovascular Disease

## 2015-02-25 NOTE — Progress Notes (Signed)
Patient ID: Laurie James, female   DOB: [birth year], 69 y.o.   MRN: [id number]    69 y.o. referred by Dr Leticia Clas for CAD   EXTRACARDIAC FINDINGS:  Cardiac CT 9/13   Changes of mild peripheral interstitial prominence, likely early fibrosis. Scarring in the lingula and right middle lobe. This can be seen with old Mycobacterium avium infections. No effusions or acute findings. No adenopathy lesion in the visualized lower mediastinum or hila.  IMPRESSION:  1. Positive for coronary artery disease. The patient's total coronary artery calcium score is 103.3, which is 80th percentile for patient's matched age and gender. 2. Moderate calcified plaque in the proximal to mid LAD with less than 50% luminal narrowing. Focal mixed plaque at the origin of the RCA appears more significant, with luminal narrowing 50-75%, likely closer to 50%. 3. Right coronary artery dominance.  Still smoking  Has some pressure and dyspnea in her chest with activity No rest pain.  Mild cough   F/U Myovue with no ischemia January 2016  Overall Impression:  Low risk stress nuclear study with a small, mild, fixed anterior defect consistent with soft tissue attenuation; no ischemia.  LV Ejection Fraction: 67%.  LV Wall Motion:  NL LV Function; NL Wall Motion   ROS: Denies fever, malais, weight loss, blurry vision, decreased visual acuity, cough, sputum, SOB, hemoptysis, pleuritic pain, palpitaitons, heartburn, abdominal pain, melena, lower extremity edema, claudication, or rash.  All other systems reviewed and negative   General: Affect appropriate Chronically ill thin bronchitic female  HEENT: normal Neck supple with no adenopathy JVP normal no bruits no thyromegaly Lungs clear with no wheezing and good diaphragmatic motion Heart:  S1/S2 no murmur,rub, gallop or click PMI normal Abdomen: benighn, BS positve, no tenderness, no AAA no bruit.  No HSM or HJR Distal pulses intact with no bruits No  edema Neuro non-focal Skin warm and dry No muscular weakness  Medications Current Outpatient Prescriptions  Medication Sig Dispense Refill  . ALPRAZolam (XANAX) 0.5 MG tablet Take 0.5 mg by mouth daily.  1  . Aspirin-Salicylamide-Caffeine (BC HEADACHE POWDER PO) Take 1 Package by mouth daily.    . citalopram (CELEXA) 20 MG tablet Take 60 mg by mouth daily.    . citalopram (CELEXA) 40 MG tablet Take 40 mg by mouth daily.  6  . esomeprazole (NEXIUM) 40 MG capsule Take 40 mg by mouth daily at 12 noon.     No current facility-administered medications for this visit.    Allergies Codeine  Family History: No family history on file.  Social History: Social History   Social History  . Marital Status: Married    Spouse Name: N/A  . Number of Children: N/A  . Years of Education: N/A   Occupational History  . Not on file.   Social History Main Topics  . Smoking status: Current Every Day Smoker -- 1.00 packs/day for 50 years    Types: Cigarettes  . Smokeless tobacco: Not on file  . Alcohol Use: No     Comment: rarely  . Drug Use: No  . Sexual Activity: Not on file   Other Topics Concern  . Not on file   Social History Narrative    Past Surgical History  Procedure Laterality Date  . Abdominal hysterectomy      Past Medical History  Diagnosis Date  . Depression   . Hiatal hernia   . Midsternal chest pain     a. 12/2011 Cardiac CTA Ca++ score of 103.3 (  80th %), LAD <50p/m, RCA 50-75.  . Vitamin B 12 deficiency   . Intermittent low back pain   . COPD (chronic obstructive pulmonary disease)   . Osteoporosis   . AMD (age related macular degeneration)   . Memory impairment     Electrocardiogram:  12/23/11  SR rate 94 nonspecific ST changes   Assessment and Plan CAD:  Calcium score 103  04/2014 normal myovue  Continue risk factor modification

## 2015-02-26 ENCOUNTER — Encounter: Payer: 59 | Admitting: Cardiovascular Disease

## 2015-02-26 DIAGNOSIS — R0989 Other specified symptoms and signs involving the circulatory and respiratory systems: Secondary | ICD-10-CM

## 2015-03-03 ENCOUNTER — Encounter: Payer: Self-pay | Admitting: Cardiovascular Disease

## 2015-08-17 ENCOUNTER — Ambulatory Visit: Payer: 59 | Admitting: Cardiovascular Disease

## 2015-09-16 ENCOUNTER — Ambulatory Visit: Payer: 59 | Admitting: Cardiovascular Disease

## 2015-12-08 ENCOUNTER — Ambulatory Visit (INDEPENDENT_AMBULATORY_CARE_PROVIDER_SITE_OTHER): Payer: 59 | Admitting: Ophthalmology

## 2015-12-22 ENCOUNTER — Ambulatory Visit (INDEPENDENT_AMBULATORY_CARE_PROVIDER_SITE_OTHER): Payer: 59 | Admitting: Ophthalmology

## 2015-12-22 ENCOUNTER — Other Ambulatory Visit: Payer: Self-pay | Admitting: Internal Medicine

## 2015-12-22 DIAGNOSIS — Z1231 Encounter for screening mammogram for malignant neoplasm of breast: Secondary | ICD-10-CM

## 2015-12-27 ENCOUNTER — Ambulatory Visit (INDEPENDENT_AMBULATORY_CARE_PROVIDER_SITE_OTHER): Payer: 59 | Admitting: Ophthalmology

## 2015-12-27 DIAGNOSIS — H353134 Nonexudative age-related macular degeneration, bilateral, advanced atrophic with subfoveal involvement: Secondary | ICD-10-CM | POA: Diagnosis not present

## 2015-12-27 DIAGNOSIS — H43813 Vitreous degeneration, bilateral: Secondary | ICD-10-CM

## 2015-12-27 DIAGNOSIS — H2511 Age-related nuclear cataract, right eye: Secondary | ICD-10-CM

## 2016-01-05 ENCOUNTER — Ambulatory Visit: Payer: 59

## 2016-01-28 ENCOUNTER — Ambulatory Visit: Payer: 59

## 2016-06-26 ENCOUNTER — Telehealth: Payer: Self-pay | Admitting: Cardiovascular Disease

## 2016-06-26 NOTE — Telephone Encounter (Signed)
Patient complaining of sharp chest pain yesterday after eating. Patient stated she was sitting down and resting at the time. Her BP was 140/90 and patient stated that her heart was racing. Patient stated she has been feeling weak and gets SOB when walking up a flight of stairs. Patient also has history of COPD and smoking. Patient stated she is feeling fine, except for feeling a little sore and weak. Patient has an appointment on Friday. Dr. Eden Emms recommend that patient keep her appointment for this Friday.   Patient verbalized understanding and will come to office on Friday.

## 2016-06-26 NOTE — Telephone Encounter (Signed)
New Message    Pt states she was at  A baby shower and after eating lunch the chest pains started   Appt 06/30/16 1130a  Pt c/o of Chest Pain: STAT if CP now or developed within 24 hours  1. Are you having CP right now? no  2. Are you experiencing any other symptoms (ex. SOB, nausea, vomiting, sweating)? no  3. How long have you been experiencing CP? 1 day  4. Is your CP continuous or coming and going? none  5. Have you taken Nitroglycerin? no ?

## 2016-06-28 NOTE — Progress Notes (Signed)
Patient ID: Laurie James, female   DOB: 01-May-1945, 71 y.o.   MRN: [id number]    71 y.o.  referred by Dr Leticia Clas in 2016  for CAD   EXTRACARDIAC FINDINGS:  Cardiac CT 9/13   Changes of mild peripheral interstitial prominence, likely early fibrosis. Scarring in the lingula and right middle lobe. This can be seen with old Mycobacterium avium infections. No effusions or acute findings. No adenopathy lesion in the visualized lower mediastinum or hila.  IMPRESSION:  1. Positive for coronary artery disease. The patient's total coronary artery calcium score is 103.3, which is 80th percentile for patient's matched age and gender. 2. Moderate calcified plaque in the proximal to mid LAD with less than 50% luminal narrowing. Focal mixed plaque at the origin of the RCA appears more significant, with luminal narrowing 50-75%, likely closer to 50%. 3. Right coronary artery dominance.  Had f/u myovue 04/28/14 reviewed low risk with breast attenuation and no ischemia EF 67%   Still smoking  Has not had recent CXR Pain in legs with ambulation may be claudication   ROS: Denies fever, malais, weight loss, blurry vision, decreased visual acuity, cough, sputum, SOB, hemoptysis, pleuritic pain, palpitaitons, heartburn, abdominal pain, melena, lower extremity edema, claudication, or rash.  All other systems reviewed and negative   General: Affect appropriate Chronically ill thin bronchitic female  HEENT: normal Neck supple with no adenopathy JVP normal no bruits no thyromegaly Lungs clear with no wheezing and good diaphragmatic motion Heart:  S1/S2 no murmur,rub, gallop or click PMI normal Abdomen: benighn, BS positve, no tenderness, no AAA no bruit.  No HSM or HJR Distal pulses intact with no bruits No edema Neuro non-focal Skin warm and dry No muscular weakness Peripheral pulses hard to palpate   Medications Current Outpatient Prescriptions  Medication Sig Dispense Refill  .  Aspirin-Salicylamide-Caffeine (BC HEADACHE POWDER PO) Take 1 Package by mouth daily.    . cyanocobalamin (,VITAMIN B-12,) 1000 MCG/ML injection Inject 1,000 mcg into the muscle every 30 (thirty) days.  3  . esomeprazole (NEXIUM) 40 MG capsule Take 40 mg by mouth daily at 12 noon.    . promethazine (PHENERGAN) 25 MG tablet Take 25 mg by mouth 3 (three) times daily as needed.  1   No current facility-administered medications for this visit.     Allergies Codeine  Family History: History reviewed. No pertinent family history.  Social History: Social History   Social History  . Marital status: Married    Spouse name: N/A  . Number of children: N/A  . Years of education: N/A   Occupational History  . Not on file.   Social History Main Topics  . Smoking status: Current Every Day Smoker    Packs/day: 1.00    Years: 50.00    Types: Cigarettes  . Smokeless tobacco: Never Used  . Alcohol use No     Comment: rarely  . Drug use: No  . Sexual activity: Not on file   Other Topics Concern  . Not on file   Social History Narrative  . No narrative on file    Past Surgical History:  Procedure Laterality Date  . ABDOMINAL HYSTERECTOMY      Past Medical History:  Diagnosis Date  . AMD (age related macular degeneration)   . COPD (chronic obstructive pulmonary disease) (HCC)   . Depression   . Hiatal hernia   . Intermittent low back pain   . Memory impairment   . Midsternal chest pain  a. 12/2011 Cardiac CTA Ca++ score of 103.3 (80th %), LAD <50p/m, RCA 50-75.  . Osteoporosis   . Vitamin B 12 deficiency     Electrocardiogram:  12/23/11  SR rate 94 nonspecific ST changes  06/30/16 SR rate 102 PAC nonspecific ST/T wave changes   Assessment and Plan  1. CAD/Chest pain stable myovue normal 2016 observe 2. COPD discussed benefit of lung cancer screening CT will order counseled on smoking cessation Less than 10 minutes  3. Hiatal Hernia may be related to some of her chest  pain in past continue nexium 4. Leg pain:  f/u LE arterial duplex and ABI's r/o claudication Relationship of smoking to  PVD discussed ASA

## 2016-06-30 ENCOUNTER — Ambulatory Visit (INDEPENDENT_AMBULATORY_CARE_PROVIDER_SITE_OTHER): Payer: Medicare Other | Admitting: Cardiovascular Disease

## 2016-06-30 ENCOUNTER — Encounter: Payer: Self-pay | Admitting: Cardiovascular Disease

## 2016-06-30 VITALS — BP 130/80 | Ht <= 58 in | Wt 103.0 lb

## 2016-06-30 DIAGNOSIS — R079 Chest pain, unspecified: Secondary | ICD-10-CM | POA: Diagnosis not present

## 2016-06-30 DIAGNOSIS — I739 Peripheral vascular disease, unspecified: Secondary | ICD-10-CM

## 2016-06-30 DIAGNOSIS — F17200 Nicotine dependence, unspecified, uncomplicated: Secondary | ICD-10-CM | POA: Diagnosis not present

## 2016-06-30 DIAGNOSIS — Z7689 Persons encountering health services in other specified circumstances: Secondary | ICD-10-CM

## 2016-06-30 NOTE — Patient Instructions (Addendum)
Medication Instructions:  Your physician recommends that you continue on your current medications as directed. Please refer to the Current Medication list given to you today.  Labwork: NONE  Testing/Procedures: Non-Cardiac CT scanning for lung cancer screening, (CAT scanning), is a noninvasive, special x-ray that produces cross-sectional images of the body using x-rays and a computer. CT scans help physicians diagnose and treat medical conditions. For some CT exams, a contrast material is used to enhance visibility in the area of the body being studied. CT scans provide greater clarity and reveal more details than regular x-ray exams.   Your physician has requested that you have a lower  arterial duplex with ABI. This test is an ultrasound of the arteries in the legs. It looks at arterial blood flow in the legs. Allow one hour for Lower Arterial scans. There are no restrictions or special instructions  Follow-Up: Your physician wants you to follow-up in: 6 months with Dr. Eden Emms. You will receive a reminder letter in the mail two months in advance. If you don't receive a letter, please call our office to schedule the follow-up appointment.   If you need a refill on your cardiac medications before your next appointment, please call your pharmacy.

## 2016-07-07 ENCOUNTER — Ambulatory Visit (INDEPENDENT_AMBULATORY_CARE_PROVIDER_SITE_OTHER)
Admission: RE | Admit: 2016-07-07 | Discharge: 2016-07-07 | Disposition: A | Discharge status: Home / Self Care | Payer: Medicare Other | Source: Ambulatory Visit | Attending: Cardiovascular Disease | Admitting: Cardiovascular Disease

## 2016-07-07 DIAGNOSIS — F17200 Nicotine dependence, unspecified, uncomplicated: Secondary | ICD-10-CM

## 2016-07-07 DIAGNOSIS — I739 Peripheral vascular disease, unspecified: Secondary | ICD-10-CM

## 2016-07-07 DIAGNOSIS — Z87891 Personal history of nicotine dependence: Secondary | ICD-10-CM | POA: Diagnosis not present

## 2016-07-07 DIAGNOSIS — R079 Chest pain, unspecified: Secondary | ICD-10-CM

## 2016-07-10 ENCOUNTER — Telehealth: Payer: Self-pay

## 2016-07-10 DIAGNOSIS — R918 Other nonspecific abnormal finding of lung field: Secondary | ICD-10-CM

## 2016-07-10 NOTE — Telephone Encounter (Signed)
Called patient about her CT results. Per Dr. Eden Emms, 8.8 mm lung nodule multiple nodules f/u same CT in 3 months. Will have scheduling call to make patient an appointment. Patient verbalized understanding.

## 2016-07-10 NOTE — Telephone Encounter (Signed)
-----   Message from Wendall Stade, MD sent at 07/09/2016 12:51 PM EDT ----- 8.8 mm lung nodule multiple nodules f/u same CT in 3 months

## 2016-07-12 ENCOUNTER — Other Ambulatory Visit: Payer: Self-pay | Admitting: Cardiovascular Disease

## 2016-07-12 DIAGNOSIS — I739 Peripheral vascular disease, unspecified: Secondary | ICD-10-CM

## 2016-07-13 NOTE — Telephone Encounter (Signed)
Patient has CT appointment in July.

## 2016-07-17 ENCOUNTER — Inpatient Hospital Stay (HOSPITAL_COMMUNITY): Admission: RE | Admit: 2016-07-17 | Payer: Medicare Other | Source: Ambulatory Visit

## 2016-10-09 ENCOUNTER — Ambulatory Visit (INDEPENDENT_AMBULATORY_CARE_PROVIDER_SITE_OTHER)
Admission: RE | Admit: 2016-10-09 | Discharge: 2016-10-09 | Disposition: A | Discharge status: Home / Self Care | Payer: Medicare Other | Source: Ambulatory Visit | Attending: Cardiovascular Disease | Admitting: Cardiovascular Disease

## 2016-10-09 DIAGNOSIS — R918 Other nonspecific abnormal finding of lung field: Secondary | ICD-10-CM

## 2016-10-10 ENCOUNTER — Telehealth: Payer: Self-pay | Admitting: *Deleted

## 2016-10-10 NOTE — Telephone Encounter (Signed)
Pt notified of Chest CT results and findings by phone with verbal understanding. Pt advised per Dr. Eden Emms to continue lung cancer screenings with PCP or Pulmonology. Pt states she does not have a Pulmonologist. I advised to be sure to f/u PCP. Pt is agreeable to plan of care. I will forward a copy of results to PCP. Pt thanked me for my call.

## 2016-10-10 NOTE — Telephone Encounter (Signed)
-----   Message from Wendall Stade, MD sent at 10/09/2016  3:16 PM EDT ----- Emphysema no cancer continue lung cancer screening through primary / pulmonary

## 2017-01-08 ENCOUNTER — Ambulatory Visit (INDEPENDENT_AMBULATORY_CARE_PROVIDER_SITE_OTHER): Payer: 59 | Admitting: Ophthalmology

## 2017-01-22 ENCOUNTER — Encounter (INDEPENDENT_AMBULATORY_CARE_PROVIDER_SITE_OTHER): Payer: Self-pay | Admitting: Ophthalmology

## 2017-02-20 ENCOUNTER — Encounter (HOSPITAL_COMMUNITY): Payer: Medicare Other

## 2017-11-15 ENCOUNTER — Encounter (INDEPENDENT_AMBULATORY_CARE_PROVIDER_SITE_OTHER): Payer: Medicare Other | Admitting: Ophthalmology

## 2017-11-15 DIAGNOSIS — H353134 Nonexudative age-related macular degeneration, bilateral, advanced atrophic with subfoveal involvement: Secondary | ICD-10-CM

## 2017-11-15 DIAGNOSIS — H43813 Vitreous degeneration, bilateral: Secondary | ICD-10-CM

## 2018-04-18 ENCOUNTER — Other Ambulatory Visit: Payer: Self-pay | Admitting: Internal Medicine

## 2018-04-18 DIAGNOSIS — Z1231 Encounter for screening mammogram for malignant neoplasm of breast: Secondary | ICD-10-CM

## 2018-05-17 ENCOUNTER — Other Ambulatory Visit: Payer: Self-pay | Admitting: Internal Medicine

## 2018-05-17 ENCOUNTER — Ambulatory Visit
Admission: RE | Admit: 2018-05-17 | Discharge: 2018-05-17 | Disposition: A | Discharge status: Home / Self Care | Payer: Medicare Other | Source: Ambulatory Visit | Attending: Internal Medicine | Admitting: Internal Medicine

## 2018-05-17 DIAGNOSIS — M810 Age-related osteoporosis without current pathological fracture: Secondary | ICD-10-CM

## 2018-05-17 DIAGNOSIS — Z1231 Encounter for screening mammogram for malignant neoplasm of breast: Secondary | ICD-10-CM

## 2018-05-20 ENCOUNTER — Other Ambulatory Visit: Payer: Self-pay | Admitting: Internal Medicine

## 2018-05-20 DIAGNOSIS — R928 Other abnormal and inconclusive findings on diagnostic imaging of breast: Secondary | ICD-10-CM

## 2018-05-22 ENCOUNTER — Other Ambulatory Visit: Payer: Medicare Other

## 2018-05-23 ENCOUNTER — Ambulatory Visit
Admission: RE | Admit: 2018-05-23 | Discharge: 2018-05-23 | Disposition: A | Discharge status: Home / Self Care | Payer: Medicare Other | Source: Ambulatory Visit | Attending: Internal Medicine | Admitting: Internal Medicine

## 2018-05-23 DIAGNOSIS — R928 Other abnormal and inconclusive findings on diagnostic imaging of breast: Secondary | ICD-10-CM

## 2018-07-02 ENCOUNTER — Telehealth: Payer: Self-pay

## 2018-07-02 NOTE — Telephone Encounter (Signed)
Left message for patient to call back  

## 2018-07-02 NOTE — Telephone Encounter (Signed)
-----   Message from Wendall Stade, MD sent at 07/01/2018  5:46 PM EDT ----- Ok to cancel 07/10/18 visit and reschedule in 6 months

## 2018-07-02 NOTE — Telephone Encounter (Signed)
Patient called and canceled her appointment. Will put on list for recall in 6 months.

## 2018-07-10 ENCOUNTER — Ambulatory Visit: Payer: Medicare Other | Admitting: Cardiovascular Disease

## 2018-11-08 ENCOUNTER — Ambulatory Visit: Payer: Medicare Other | Admitting: Cardiovascular Disease

## 2018-11-18 ENCOUNTER — Encounter (INDEPENDENT_AMBULATORY_CARE_PROVIDER_SITE_OTHER): Payer: Medicare Other | Admitting: Ophthalmology

## 2019-06-05 ENCOUNTER — Other Ambulatory Visit: Payer: Self-pay | Admitting: Internal Medicine

## 2019-06-05 DIAGNOSIS — Z1231 Encounter for screening mammogram for malignant neoplasm of breast: Secondary | ICD-10-CM

## 2019-06-05 DIAGNOSIS — M810 Age-related osteoporosis without current pathological fracture: Secondary | ICD-10-CM

## 2019-07-22 NOTE — Progress Notes (Signed)
Patient ID: Laurie James, female   DOB: 09/05/1945, 74 y.o.   MRN: [id number]    73 y.o.  Has not been seen in 3 years. At that time had CAD on CT 2013  with calcium score of 103 which was 80 th percentile for age and sex and moderate LAD/RCA disease Myovue 04/28/14 no ischemia EF 67% Ongoing smoker In 2018 had ? Claudication but did not show up for her ABI's   LDL is 91   She is tearful today One son in his 59's died last week of MI. He did not like to go to doctors. Son Laurie James with her today and has daughter in town as well. She continues to smoke a ppd. Activity limited by balance issues and poor eye sight with macular degeneration   Has exertional dyspnea no chest pain     ROS: Denies fever, malais, weight loss, blurry vision, decreased visual acuity, cough, sputum, SOB, hemoptysis, pleuritic pain, palpitaitons, heartburn, abdominal pain, melena, lower extremity edema, claudication, or rash.  All other systems reviewed and negative  General: Affect appropriate Chronically ill thin bronchitic female  HEENT: normal Neck supple with no adenopathy JVP normal no bruits no thyromegaly Lungs clear with no wheezing and good diaphragmatic motion Heart:  S1/S2 no murmur,rub, gallop or click PMI normal Abdomen: benighn, BS positve, no tenderness, no AAA no bruit.  No HSM or HJR Distal pulses intact with no bruits No edema Neuro non-focal Skin warm and dry No muscular weakness Peripheral pulses hard to palpate   Medications Current Outpatient Medications  Medication Sig Dispense Refill  . Aspirin-Salicylamide-Caffeine (BC HEADACHE POWDER PO) Take 1 Package by mouth daily.    . cyanocobalamin (,VITAMIN B-12,) 1000 MCG/ML injection Inject 1,000 mcg into the muscle every 30 (thirty) days.  3  . esomeprazole (NEXIUM) 40 MG capsule Take 40 mg by mouth daily at 12 noon.    . promethazine (PHENERGAN) 25 MG tablet Take 25 mg by mouth 3 (three) times daily as needed.  1   No current  facility-administered medications for this visit.    Allergies Codeine  Family History: Family History  Problem Relation Age of Onset  . Breast cancer Sister   . Breast cancer Sister     Social History: Social History   Socioeconomic History  . Marital status: Married    Spouse name: Not on file  . Number of children: Not on file  . Years of education: Not on file  . Highest education level: Not on file  Occupational History  . Not on file  Tobacco Use  . Smoking status: Current Every Day Smoker    Packs/day: 1.00    Years: 50.00    Pack years: 50.00    Types: Cigarettes  . Smokeless tobacco: Never Used  Substance and Sexual Activity  . Alcohol use: No    Alcohol/week: 0.0 James drinks    Comment: rarely  . Drug use: No  . Sexual activity: Not on file  Other Topics Concern  . Not on file  Social History Narrative  . Not on file   Social Determinants of Health   Financial Resource Strain:   . Difficulty of Paying Living Expenses:   Food Insecurity:   . Worried About Charity fundraiser in the Last Year:   . Arboriculturist in the Last Year:   Transportation Needs:   . Film/video editor (Medical):   Marland Kitchen Lack of Transportation (Non-Medical):   Physical Activity:   .  Days of Exercise per Week:   . Minutes of Exercise per Session:   Stress:   . Feeling of Stress :   Social Connections:   . Frequency of Communication with Friends and Family:   . Frequency of Social Gatherings with Friends and Family:   . Attends Religious Services:   . Active Member of Clubs or Organizations:   . Attends Banker Meetings:   Marland Kitchen Marital Status:   Intimate Partner Violence:   . Fear of Current or Ex-Partner:   . Emotionally Abused:   Marland Kitchen Physically Abused:   . Sexually Abused:     Past Surgical History:  Procedure Laterality Date  . ABDOMINAL HYSTERECTOMY      Past Medical History:  Diagnosis Date  . AMD (age related macular degeneration)   . COPD  (chronic obstructive pulmonary disease) (HCC)   . Depression   . Hiatal hernia   . Intermittent low back pain   . Memory impairment   . Midsternal chest pain    a. 12/2011 Cardiac CTA Ca++ score of 103.3 (80th %), LAD <50p/m, RCA 50-75.  . Osteoporosis   . Vitamin B 12 deficiency     Electrocardiogram:  12/23/11  SR rate 94 nonspecific ST changes  06/30/16 SR rate 102 PAC nonspecific ST/T wave changes 07/28/19 SR rate 84 nonspecific lateral T wave changes   Assessment and Plan  1. CAD: moderate RCA/LAD by CT 2013 non ischemic myovue 2016 will order f/u to r/o progressive disease  2. COPD discussed benefit of lung cancer screening CT will order counseled on smoking cessation Less than 10 minutes   3. Hiatal Hernia may be related to some of her chest pain in past continue nexium 4. Leg pain:  f/u LE arterial duplex and ABI's r/o claudication Relationship of smoking to  PVD discussed ASA    Ordered:  Lexiscan Myovue   F/U in  A year if myovue ok   Charlton Haws MD [facility]

## 2019-07-28 ENCOUNTER — Encounter: Payer: Self-pay | Admitting: Cardiovascular Disease

## 2019-07-28 ENCOUNTER — Ambulatory Visit: Payer: Medicare Other | Admitting: Cardiovascular Disease

## 2019-07-28 ENCOUNTER — Other Ambulatory Visit: Payer: Self-pay

## 2019-07-28 VITALS — BP 140/92 | HR 84 | Ht <= 58 in | Wt 97.0 lb

## 2019-07-28 DIAGNOSIS — I2510 Atherosclerotic heart disease of native coronary artery without angina pectoris: Secondary | ICD-10-CM

## 2019-07-28 NOTE — Patient Instructions (Addendum)
Medication Instructions:  *If you need a refill on your cardiac medications before your next appointment, please call your pharmacy*  Lab Work: If you have labs (blood work) drawn today and your tests are completely normal, you will receive your results only by: . MyChart Message (if you have MyChart) OR . A paper copy in the mail If you have any lab test that is abnormal or we need to change your treatment, we will call you to review the results.  Testing/Procedures: Your physician has requested that you have a lexiscan myoview. For further information please visit [email or web address]. Please follow instruction sheet, as given.  Follow-Up: At [facility], you and your health needs are our priority.  As part of our continuing mission to provide you with exceptional heart care, we have created designated Provider Care Teams.  These Care Teams include your primary Cardiologist (physician) and Advanced Practice Providers (APPs -  Physician Assistants and Nurse Practitioners) who all work together to provide you with the care you need, when you need it.  We recommend signing up for the patient portal called "MyChart".  Sign up information is provided on this After Visit Summary.  MyChart is used to connect with patients for Virtual Visits (Telemedicine).  Patients are able to view lab/test results, encounter notes, upcoming appointments, etc.  Non-urgent messages can be sent to your provider as well.   To learn more about what you can do with MyChart, go to [email or web address].    Your next appointment:   12 month(s)  The format for your next appointment:   In Person  Provider:   You may see Dr. Nishan or one of the following Advanced Practice Providers on your designated Care Team:    Lori Gerhardt, NP  Laura Risse, NP  Jill McDaniel, NP 

## 2019-08-25 ENCOUNTER — Encounter (HOSPITAL_COMMUNITY): Payer: Medicare Other

## 2019-09-05 ENCOUNTER — Other Ambulatory Visit: Payer: Self-pay

## 2019-09-05 ENCOUNTER — Ambulatory Visit
Admission: RE | Admit: 2019-09-05 | Discharge: 2019-09-05 | Disposition: A | Discharge status: Home / Self Care | Payer: Medicare Other | Source: Ambulatory Visit | Attending: Internal Medicine | Admitting: Internal Medicine

## 2019-09-05 DIAGNOSIS — M810 Age-related osteoporosis without current pathological fracture: Secondary | ICD-10-CM

## 2019-09-05 DIAGNOSIS — Z1231 Encounter for screening mammogram for malignant neoplasm of breast: Secondary | ICD-10-CM

## 2019-09-10 ENCOUNTER — Encounter (HOSPITAL_COMMUNITY): Payer: Medicare Other

## 2019-09-17 ENCOUNTER — Telehealth: Payer: Self-pay | Admitting: *Deleted

## 2019-09-17 NOTE — Telephone Encounter (Signed)
Left message on voicemail per DPR in reference to upcoming appointment scheduled on 09/25/2019 at 0815 with detailed instructions given per Myocardial Perfusion Study Information Sheet for the test. LM to arrive 15 minutes early, and that it is imperative to arrive on time for appointment to keep from having the test rescheduled. If you need to cancel or reschedule your appointment, please call the office within 24 hours of your appointment. Failure to do so may result in a cancellation of your appointment, and a $50 no show fee. Phone number given for call back for any questions.  No mychart available.Laurie James, Adelene Idler

## 2019-09-25 ENCOUNTER — Other Ambulatory Visit: Payer: Self-pay

## 2019-09-25 ENCOUNTER — Ambulatory Visit (INDEPENDENT_AMBULATORY_CARE_PROVIDER_SITE_OTHER): Payer: Medicare Other

## 2019-09-25 DIAGNOSIS — I2510 Atherosclerotic heart disease of native coronary artery without angina pectoris: Secondary | ICD-10-CM

## 2019-09-25 LAB — MYOCARDIAL PERFUSION IMAGING
LV dias vol: 39 mL (ref 46–106)
LV sys vol: 19 mL
Peak HR: 106 {beats}/min
Rest HR: 100 {beats}/min
SDS: 1
SRS: 11
SSS: 12
TID: 0.91

## 2019-09-25 MED ORDER — REGADENOSON 0.4 MG/5ML IV SOLN
0.4000 mg | Freq: Once | INTRAVENOUS | Status: AC
  Administered 2019-09-25: 0.4 mg via INTRAVENOUS

## 2019-09-25 MED ORDER — TECHNETIUM TC 99M TETROFOSMIN IV KIT
30.6000 | PACK | Freq: Once | INTRAVENOUS | Status: AC | PRN
  Administered 2019-09-25: 30.6 via INTRAVENOUS

## 2019-09-25 MED ORDER — TECHNETIUM TC 99M TETROFOSMIN IV KIT
10.7000 | PACK | Freq: Once | INTRAVENOUS | Status: AC | PRN
  Administered 2019-09-25: 10.7 via INTRAVENOUS

## 2020-04-22 DIAGNOSIS — E538 Deficiency of other specified B group vitamins: Secondary | ICD-10-CM | POA: Diagnosis not present

## 2020-04-22 DIAGNOSIS — R109 Unspecified abdominal pain: Secondary | ICD-10-CM | POA: Diagnosis not present

## 2020-04-22 DIAGNOSIS — K219 Gastro-esophageal reflux disease without esophagitis: Secondary | ICD-10-CM | POA: Diagnosis not present

## 2020-06-08 DIAGNOSIS — I700 Atherosclerosis of aorta: Secondary | ICD-10-CM | POA: Diagnosis not present

## 2020-06-08 DIAGNOSIS — I2510 Atherosclerotic heart disease of native coronary artery without angina pectoris: Secondary | ICD-10-CM | POA: Diagnosis not present

## 2020-06-08 DIAGNOSIS — R0981 Nasal congestion: Secondary | ICD-10-CM | POA: Diagnosis not present

## 2020-06-08 DIAGNOSIS — K219 Gastro-esophageal reflux disease without esophagitis: Secondary | ICD-10-CM | POA: Diagnosis not present

## 2020-06-08 DIAGNOSIS — E538 Deficiency of other specified B group vitamins: Secondary | ICD-10-CM | POA: Diagnosis not present

## 2020-06-08 DIAGNOSIS — R600 Localized edema: Secondary | ICD-10-CM | POA: Diagnosis not present

## 2020-06-08 DIAGNOSIS — Z0000 Encounter for general adult medical examination without abnormal findings: Secondary | ICD-10-CM | POA: Diagnosis not present

## 2020-06-08 DIAGNOSIS — M810 Age-related osteoporosis without current pathological fracture: Secondary | ICD-10-CM | POA: Diagnosis not present

## 2020-06-08 DIAGNOSIS — J449 Chronic obstructive pulmonary disease, unspecified: Secondary | ICD-10-CM | POA: Diagnosis not present

## 2020-07-12 DIAGNOSIS — R918 Other nonspecific abnormal finding of lung field: Secondary | ICD-10-CM | POA: Diagnosis not present

## 2020-07-12 DIAGNOSIS — S60512A Abrasion of left hand, initial encounter: Secondary | ICD-10-CM | POA: Diagnosis not present

## 2020-07-12 DIAGNOSIS — S0081XA Abrasion of other part of head, initial encounter: Secondary | ICD-10-CM | POA: Diagnosis not present

## 2020-07-12 DIAGNOSIS — S00212A Abrasion of left eyelid and periocular area, initial encounter: Secondary | ICD-10-CM | POA: Diagnosis not present

## 2020-07-12 DIAGNOSIS — S60511A Abrasion of right hand, initial encounter: Secondary | ICD-10-CM | POA: Diagnosis not present

## 2020-07-28 ENCOUNTER — Other Ambulatory Visit: Payer: Self-pay | Admitting: Internal Medicine

## 2020-07-28 DIAGNOSIS — Z1231 Encounter for screening mammogram for malignant neoplasm of breast: Secondary | ICD-10-CM

## 2020-08-11 IMAGING — MG DIGITAL SCREENING BILAT W/ TOMO W/ CAD
8 series · 9 of 24 positions shown · non-contrast
Comparison: Previous exam(s).

CLINICAL DATA: Screening.

EXAM:
DIGITAL SCREENING BILATERAL MAMMOGRAM WITH TOMO AND CAD

[R MLO synth-2D]
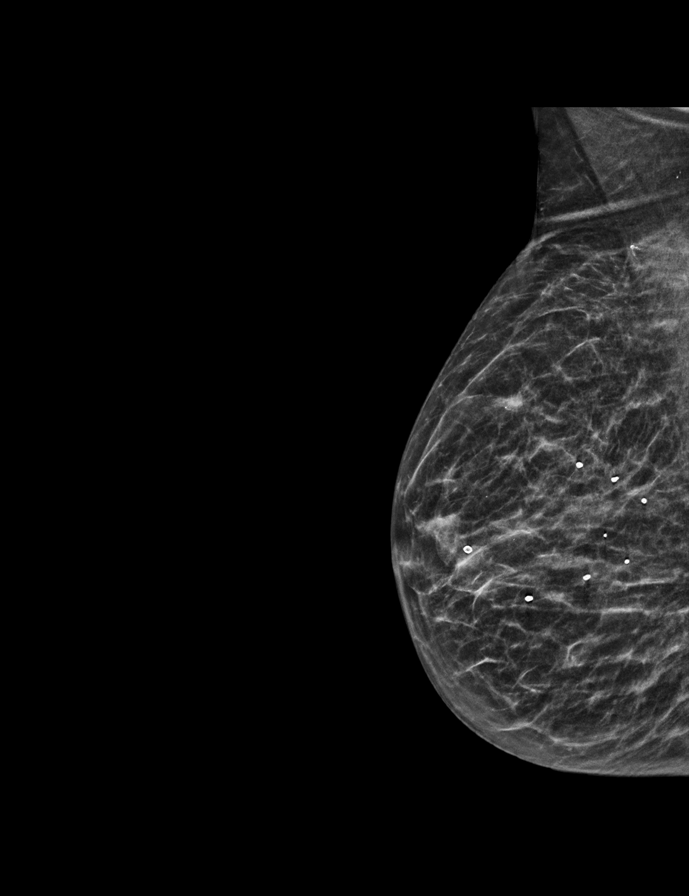

[R CC synth-2D]
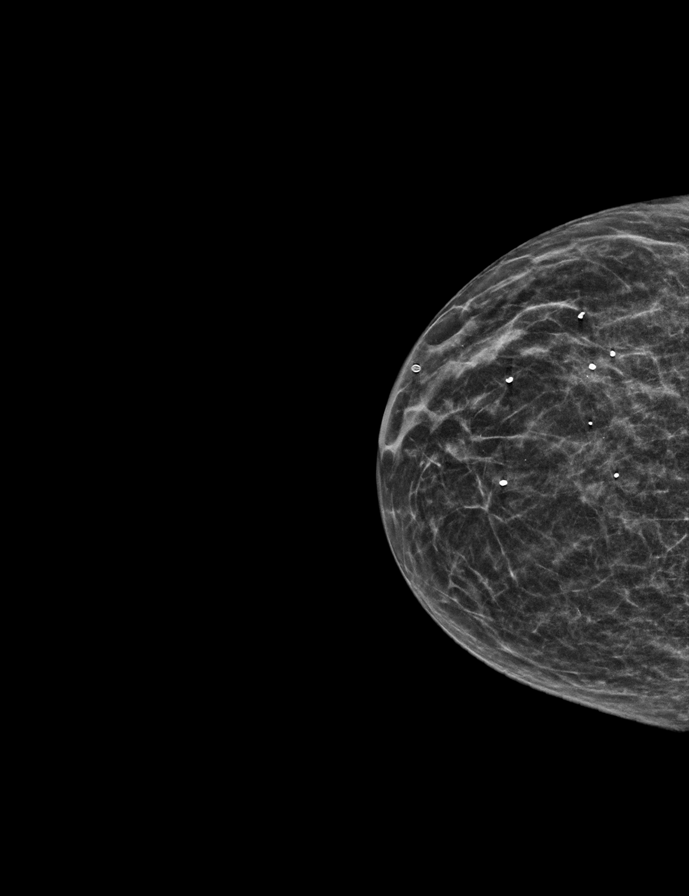

[L MLO synth-2D]
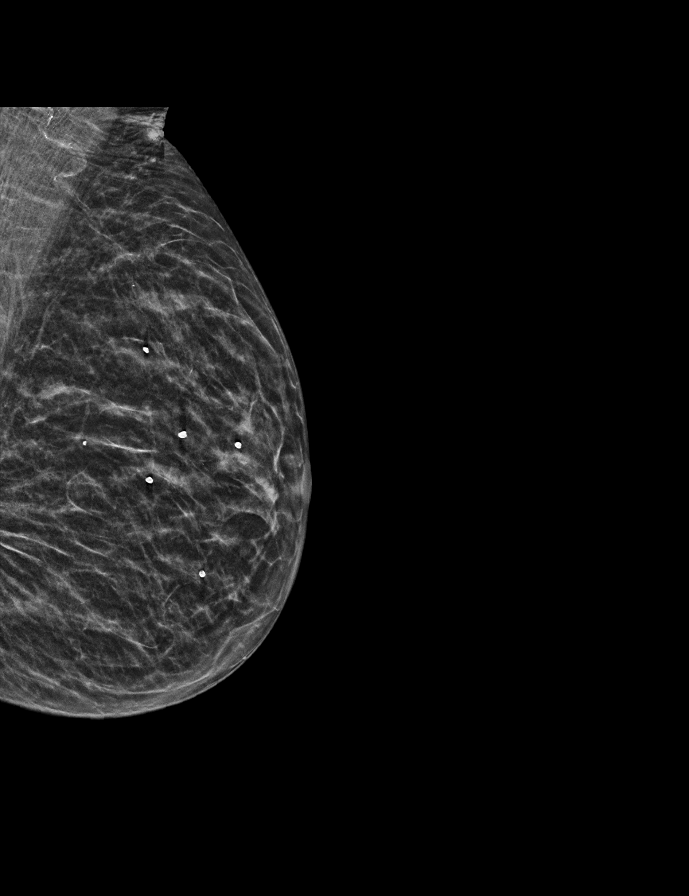

[L CC synth-2D]
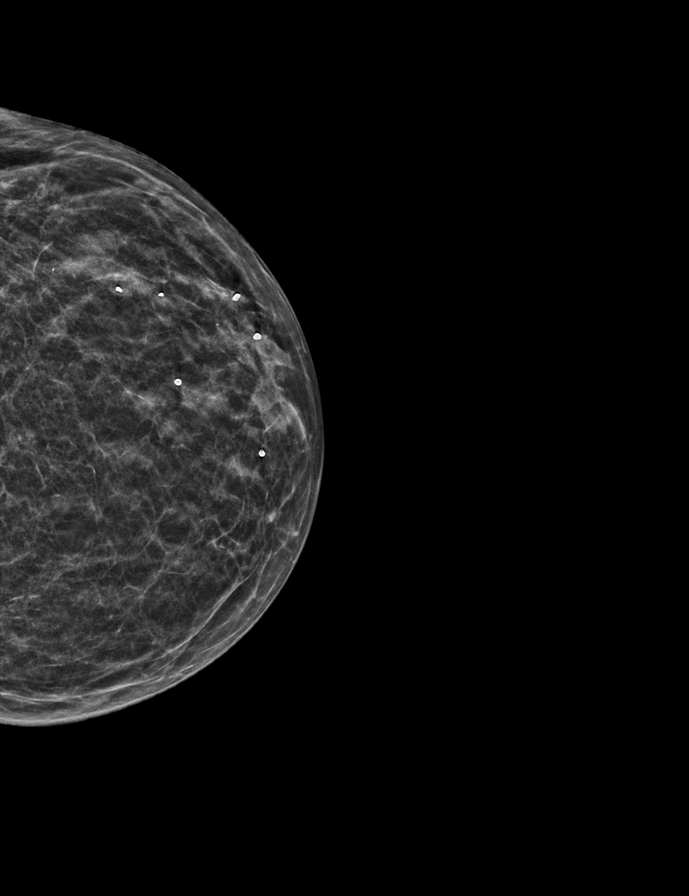

[R MLO tomo · 2 of 42 frames shown]
[frame 14/42]
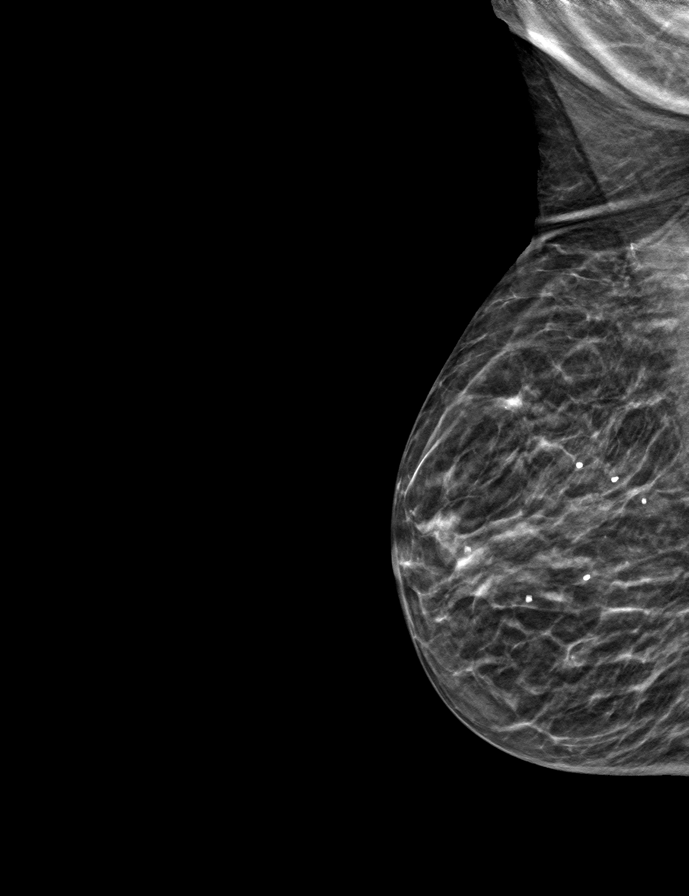
[frame 21/42]
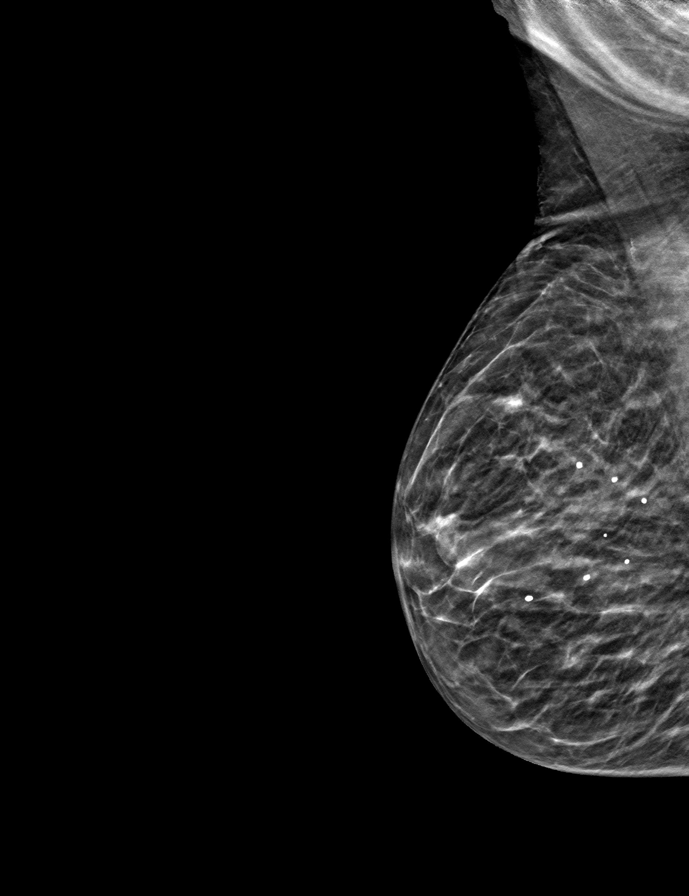

[R CC tomo · tomo slice 21/40.0]
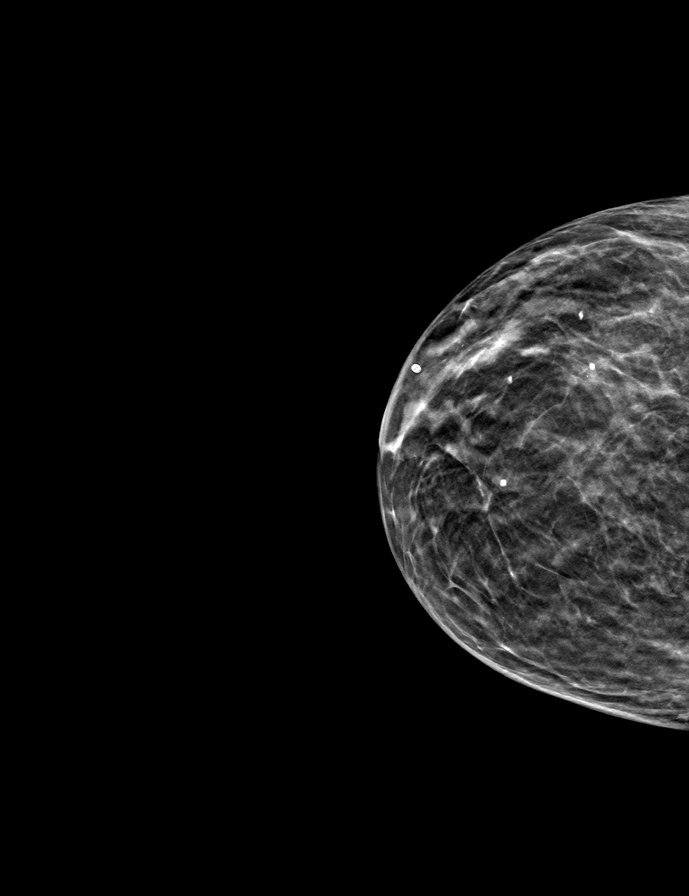

[L MLO tomo · tomo slice 19/37.0]
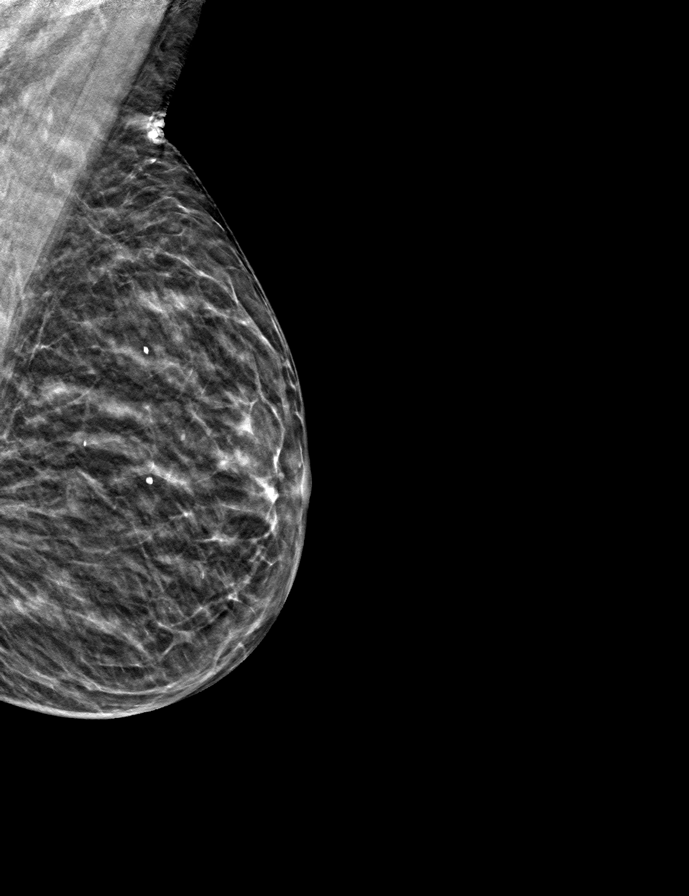

[L CC tomo · tomo slice 17/34.0]
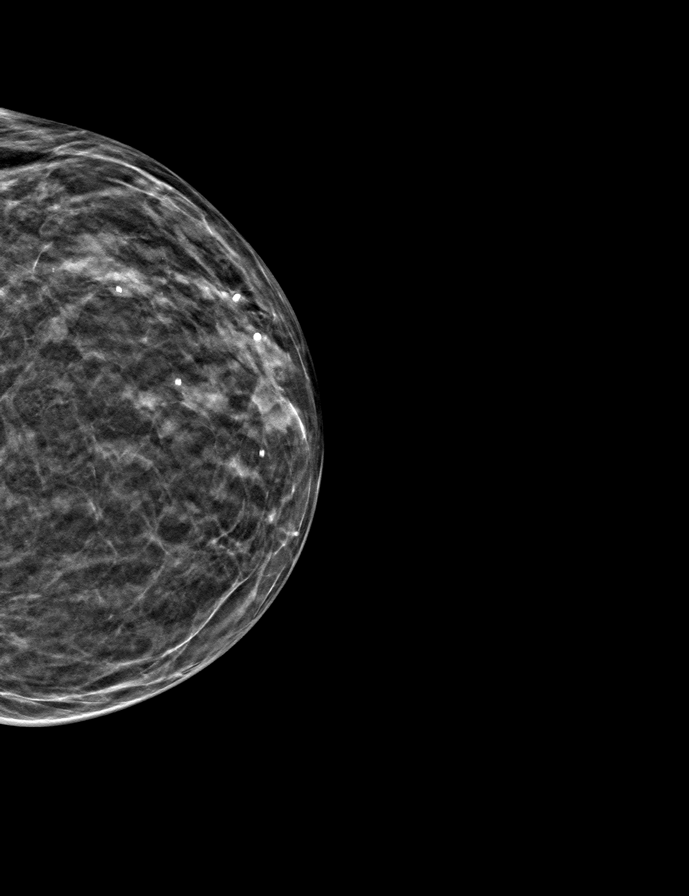

[9 of 24 positions shown; findings below may reference images not displayed]

ACR Breast Density Category b: There are scattered areas of
fibroglandular density.
FINDINGS: There are no findings suspicious for malignancy. Images were
processed with CAD.
IMPRESSION: No mammographic evidence of malignancy. A result letter of this
screening mammogram will be mailed directly to the patient.

RECOMMENDATION:
Screening mammogram in one year. (Code:[id number])

BI-RADS CATEGORY  1: Negative.

## 2020-08-24 ENCOUNTER — Other Ambulatory Visit: Payer: Self-pay | Admitting: Internal Medicine

## 2020-08-24 DIAGNOSIS — R911 Solitary pulmonary nodule: Secondary | ICD-10-CM | POA: Diagnosis not present

## 2020-08-24 DIAGNOSIS — K219 Gastro-esophageal reflux disease without esophagitis: Secondary | ICD-10-CM | POA: Diagnosis not present

## 2020-09-07 ENCOUNTER — Inpatient Hospital Stay: Admission: RE | Admit: 2020-09-07 | Payer: Medicare Other | Source: Ambulatory Visit

## 2020-09-08 DIAGNOSIS — I480 Paroxysmal atrial fibrillation: Secondary | ICD-10-CM

## 2020-09-08 HISTORY — DX: Paroxysmal atrial fibrillation: I48.0

## 2020-09-15 ENCOUNTER — Ambulatory Visit: Payer: Medicare Other

## 2020-09-21 ENCOUNTER — Ambulatory Visit
Admission: RE | Admit: 2020-09-21 | Discharge: 2020-09-21 | Disposition: A | Discharge status: Home / Self Care | Payer: Medicare Other | Source: Ambulatory Visit | Attending: Internal Medicine | Admitting: Internal Medicine

## 2020-09-21 ENCOUNTER — Other Ambulatory Visit: Payer: Medicare Other

## 2020-09-21 DIAGNOSIS — I2510 Atherosclerotic heart disease of native coronary artery without angina pectoris: Secondary | ICD-10-CM | POA: Diagnosis not present

## 2020-09-21 DIAGNOSIS — R911 Solitary pulmonary nodule: Secondary | ICD-10-CM

## 2020-09-21 DIAGNOSIS — J929 Pleural plaque without asbestos: Secondary | ICD-10-CM | POA: Diagnosis not present

## 2020-09-21 DIAGNOSIS — I878 Other specified disorders of veins: Secondary | ICD-10-CM | POA: Diagnosis not present

## 2020-09-21 DIAGNOSIS — J432 Centrilobular emphysema: Secondary | ICD-10-CM | POA: Diagnosis not present

## 2020-09-21 MED ORDER — IOPAMIDOL (ISOVUE-300) INJECTION 61%
75.0000 mL | Freq: Once | INTRAVENOUS | Status: AC | PRN
  Administered 2020-09-21: 75 mL via INTRAVENOUS

## 2020-09-27 ENCOUNTER — Telehealth: Payer: Self-pay | Admitting: Cardiovascular Disease

## 2020-09-27 DIAGNOSIS — I272 Pulmonary hypertension, unspecified: Secondary | ICD-10-CM

## 2020-09-27 DIAGNOSIS — J410 Simple chronic bronchitis: Secondary | ICD-10-CM

## 2020-09-27 NOTE — Telephone Encounter (Signed)
Patient is returning call.  °

## 2020-09-27 NOTE — Telephone Encounter (Signed)
Spoke with the patient and advised that I will send a message to Dr. Eden Emms to have him review CT scan. Patient verbalized understanding.

## 2020-09-27 NOTE — Telephone Encounter (Signed)
Referral has been placed for CHF and pulmonary. Echocardiogram has been ordered. Patient is aware that someone will be calling her to arrange appointments.

## 2020-09-27 NOTE — Telephone Encounter (Signed)
New message:     Patient calling stating that Dr.Husan from [facility] would like for Dr. Eden Emms to look at her CT scan last Tuesday the 14  to see if she need to be moved up for her apt.

## 2020-09-27 NOTE — Telephone Encounter (Signed)
Left message for patient to call back  

## 2020-10-05 ENCOUNTER — Emergency Department (HOSPITAL_COMMUNITY): Payer: Medicare Other

## 2020-10-05 ENCOUNTER — Other Ambulatory Visit: Payer: Self-pay

## 2020-10-05 ENCOUNTER — Inpatient Hospital Stay (HOSPITAL_COMMUNITY)
Admission: EM | Admit: 2020-10-05 | Discharge: 2020-10-11 | DRG: 287 | Disposition: A | Discharge status: Home / Self Care | Payer: Medicare Other | Attending: Cardiology | Admitting: Cardiology

## 2020-10-05 ENCOUNTER — Telehealth: Payer: Self-pay | Admitting: Cardiovascular Disease

## 2020-10-05 ENCOUNTER — Observation Stay (HOSPITAL_COMMUNITY): Payer: Medicare Other

## 2020-10-05 ENCOUNTER — Encounter (HOSPITAL_COMMUNITY): Payer: Self-pay | Admitting: Emergency Medicine

## 2020-10-05 DIAGNOSIS — Z20822 Contact with and (suspected) exposure to covid-19: Secondary | ICD-10-CM | POA: Diagnosis not present

## 2020-10-05 DIAGNOSIS — F17210 Nicotine dependence, cigarettes, uncomplicated: Secondary | ICD-10-CM | POA: Diagnosis present

## 2020-10-05 DIAGNOSIS — I4891 Unspecified atrial fibrillation: Secondary | ICD-10-CM | POA: Diagnosis not present

## 2020-10-05 DIAGNOSIS — I340 Nonrheumatic mitral (valve) insufficiency: Secondary | ICD-10-CM | POA: Diagnosis not present

## 2020-10-05 DIAGNOSIS — M810 Age-related osteoporosis without current pathological fracture: Secondary | ICD-10-CM | POA: Diagnosis present

## 2020-10-05 DIAGNOSIS — I959 Hypotension, unspecified: Secondary | ICD-10-CM

## 2020-10-05 DIAGNOSIS — E869 Volume depletion, unspecified: Secondary | ICD-10-CM | POA: Diagnosis not present

## 2020-10-05 DIAGNOSIS — I272 Pulmonary hypertension, unspecified: Secondary | ICD-10-CM | POA: Diagnosis not present

## 2020-10-05 DIAGNOSIS — I10 Essential (primary) hypertension: Secondary | ICD-10-CM | POA: Diagnosis present

## 2020-10-05 DIAGNOSIS — R911 Solitary pulmonary nodule: Secondary | ICD-10-CM | POA: Diagnosis not present

## 2020-10-05 DIAGNOSIS — I361 Nonrheumatic tricuspid (valve) insufficiency: Secondary | ICD-10-CM | POA: Diagnosis not present

## 2020-10-05 DIAGNOSIS — E876 Hypokalemia: Secondary | ICD-10-CM

## 2020-10-05 DIAGNOSIS — Z79899 Other long term (current) drug therapy: Secondary | ICD-10-CM | POA: Diagnosis not present

## 2020-10-05 DIAGNOSIS — I25111 Atherosclerotic heart disease of native coronary artery with angina pectoris with documented spasm: Secondary | ICD-10-CM | POA: Diagnosis not present

## 2020-10-05 DIAGNOSIS — E785 Hyperlipidemia, unspecified: Secondary | ICD-10-CM

## 2020-10-05 DIAGNOSIS — I351 Nonrheumatic aortic (valve) insufficiency: Secondary | ICD-10-CM | POA: Diagnosis not present

## 2020-10-05 DIAGNOSIS — I4819 Other persistent atrial fibrillation: Secondary | ICD-10-CM | POA: Diagnosis not present

## 2020-10-05 DIAGNOSIS — Z885 Allergy status to narcotic agent status: Secondary | ICD-10-CM

## 2020-10-05 DIAGNOSIS — J432 Centrilobular emphysema: Secondary | ICD-10-CM | POA: Diagnosis not present

## 2020-10-05 DIAGNOSIS — I517 Cardiomegaly: Secondary | ICD-10-CM | POA: Diagnosis not present

## 2020-10-05 DIAGNOSIS — J439 Emphysema, unspecified: Secondary | ICD-10-CM | POA: Diagnosis present

## 2020-10-05 DIAGNOSIS — H3530 Unspecified macular degeneration: Secondary | ICD-10-CM | POA: Diagnosis present

## 2020-10-05 DIAGNOSIS — I2583 Coronary atherosclerosis due to lipid rich plaque: Secondary | ICD-10-CM | POA: Diagnosis not present

## 2020-10-05 DIAGNOSIS — R079 Chest pain, unspecified: Secondary | ICD-10-CM | POA: Diagnosis not present

## 2020-10-05 DIAGNOSIS — R7989 Other specified abnormal findings of blood chemistry: Secondary | ICD-10-CM | POA: Diagnosis not present

## 2020-10-05 DIAGNOSIS — I071 Rheumatic tricuspid insufficiency: Secondary | ICD-10-CM

## 2020-10-05 DIAGNOSIS — E7800 Pure hypercholesterolemia, unspecified: Secondary | ICD-10-CM | POA: Diagnosis present

## 2020-10-05 DIAGNOSIS — I951 Orthostatic hypotension: Secondary | ICD-10-CM | POA: Diagnosis not present

## 2020-10-05 DIAGNOSIS — R0789 Other chest pain: Secondary | ICD-10-CM | POA: Diagnosis not present

## 2020-10-05 DIAGNOSIS — R519 Headache, unspecified: Secondary | ICD-10-CM | POA: Diagnosis not present

## 2020-10-05 DIAGNOSIS — I2510 Atherosclerotic heart disease of native coronary artery without angina pectoris: Secondary | ICD-10-CM | POA: Diagnosis not present

## 2020-10-05 LAB — TROPONIN I (HIGH SENSITIVITY)
Troponin I (High Sensitivity): 14 ng/L (ref <18)
Troponin I (High Sensitivity): 16 ng/L

## 2020-10-05 LAB — RESP PANEL BY RT-PCR (FLU A&B, COVID) ARPGX2
Influenza A by PCR: NEGATIVE
Influenza B by PCR: NEGATIVE
SARS Coronavirus 2 by RT PCR: NEGATIVE

## 2020-10-05 LAB — HEPATIC FUNCTION PANEL
ALT: 8 U/L (ref 0–44)
AST: 19 U/L (ref 15–41)
Albumin: 3.3 g/dL — ABNORMAL LOW (ref 3.5–5.0)
Alkaline Phosphatase: 84 U/L (ref 38–126)
Bilirubin, Direct: 0.3 mg/dL — ABNORMAL HIGH (ref 0.0–0.2)
Indirect Bilirubin: 0.7 mg/dL (ref 0.3–0.9)
Total Bilirubin: 1 mg/dL (ref 0.3–1.2)
Total Protein: 7.3 g/dL (ref 6.5–8.1)

## 2020-10-05 LAB — BASIC METABOLIC PANEL
Anion gap: 10 (ref 5–15)
BUN: 11 mg/dL (ref 8–23)
CO2: 23 mmol/L (ref 22–32)
Calcium: 9 mg/dL (ref 8.9–10.3)
Chloride: 105 mmol/L (ref 98–111)
Creatinine, Ser: 0.9 mg/dL (ref 0.44–1.00)
GFR, Estimated: >60 mL/min (ref >60)
Glucose, Bld: 98 mg/dL (ref 70–99)
Potassium: 3.7 mmol/L (ref 3.5–5.1)
Sodium: 138 mmol/L (ref 135–145)

## 2020-10-05 LAB — BRAIN NATRIURETIC PEPTIDE: B Natriuretic Peptide: 733.2 pg/mL — ABNORMAL HIGH (ref 0.0–100.0)

## 2020-10-05 LAB — T4, FREE: Free T4: 0.81 ng/dL (ref 0.61–1.12)

## 2020-10-05 LAB — CBC
HCT: 34.9 % — ABNORMAL LOW (ref 36.0–46.0)
Hemoglobin: 11.5 g/dL — ABNORMAL LOW (ref 12.0–15.0)
MCH: 32.7 pg (ref 26.0–34.0)
MCHC: 33 g/dL (ref 30.0–36.0)
MCV: 99.1 fL (ref 80.0–100.0)
Platelets: 194 10*3/uL (ref 150–400)
RBC: 3.52 MIL/uL — ABNORMAL LOW (ref 3.87–5.11)
RDW: 14.6 % (ref 11.5–15.5)
WBC: 11.3 10*3/uL — ABNORMAL HIGH (ref 4.0–10.5)
nRBC: 0 % (ref 0.0–0.2)

## 2020-10-05 LAB — TSH: TSH: 2.623 u[IU]/mL (ref 0.350–4.500)

## 2020-10-05 LAB — D-DIMER, QUANTITATIVE: D-Dimer, Quant: 2.08 ug/mL-FEU — ABNORMAL HIGH (ref 0.00–0.50)

## 2020-10-05 MED ORDER — METOPROLOL TARTRATE 12.5 MG HALF TABLET
12.5000 mg | ORAL_TABLET | Freq: Two times a day (BID) | ORAL | Status: DC
  Administered 2020-10-05 – 2020-10-11 (×10): 12.5 mg via ORAL
  Filled 2020-10-05 (×12): qty 1

## 2020-10-05 MED ORDER — ASPIRIN 300 MG RE SUPP: 300.0000 mg | RECTAL | Status: AC

## 2020-10-05 MED ORDER — ASPIRIN 81 MG PO CHEW: 324.0000 mg | CHEWABLE_TABLET | ORAL | Status: AC

## 2020-10-05 MED ORDER — PANTOPRAZOLE SODIUM 40 MG PO TBEC
40.0000 mg | DELAYED_RELEASE_TABLET | Freq: Every day | ORAL | Status: DC
  Administered 2020-10-07 – 2020-10-11 (×5): 40 mg via ORAL
  Filled 2020-10-05 (×8): qty 1

## 2020-10-05 MED ORDER — ASPIRIN EC 81 MG PO TBEC
81.0000 mg | DELAYED_RELEASE_TABLET | Freq: Every day | ORAL | Status: DC
  Administered 2020-10-06 – 2020-10-07 (×2): 81 mg via ORAL
  Filled 2020-10-05 (×2): qty 1

## 2020-10-05 MED ORDER — HEPARIN BOLUS VIA INFUSION
3000.0000 [IU] | Freq: Once | INTRAVENOUS | Status: AC
  Administered 2020-10-05: 3000 [IU] via INTRAVENOUS
  Filled 2020-10-05: qty 3000

## 2020-10-05 MED ORDER — IOHEXOL 350 MG/ML SOLN
50.0000 mL | Freq: Once | INTRAVENOUS | Status: AC | PRN
  Administered 2020-10-05: 50 mL via INTRAVENOUS

## 2020-10-05 MED ORDER — HEPARIN SODIUM (PORCINE) 5000 UNIT/ML IJ SOLN: 5000.0000 [IU] | Freq: Three times a day (TID) | INTRAMUSCULAR | Status: DC

## 2020-10-05 MED ORDER — HEPARIN (PORCINE) 25000 UT/250ML-% IV SOLN
950.0000 [IU]/h | INTRAVENOUS | Status: DC
  Administered 2020-10-05: 750 [IU]/h via INTRAVENOUS
  Administered 2020-10-06: 950 [IU]/h via INTRAVENOUS
  Filled 2020-10-05 (×2): qty 250

## 2020-10-05 MED ORDER — ATORVASTATIN CALCIUM 40 MG PO TABS
40.0000 mg | ORAL_TABLET | Freq: Every day | ORAL | Status: DC
  Administered 2020-10-09 – 2020-10-11 (×3): 40 mg via ORAL
  Filled 2020-10-05 (×6): qty 1

## 2020-10-05 MED ORDER — SODIUM CHLORIDE 0.9 % IV SOLN: INTRAVENOUS | Status: DC

## 2020-10-05 MED ORDER — ONDANSETRON HCL 4 MG/2ML IJ SOLN
4.0000 mg | Freq: Four times a day (QID) | INTRAMUSCULAR | Status: DC | PRN
  Administered 2020-10-08: 4 mg via INTRAVENOUS
  Filled 2020-10-05: qty 2

## 2020-10-05 MED ORDER — ACETAMINOPHEN 325 MG PO TABS: 650.0000 mg | ORAL_TABLET | ORAL | Status: DC | PRN

## 2020-10-05 MED ORDER — NITROGLYCERIN 0.4 MG SL SUBL: 0.4000 mg | SUBLINGUAL_TABLET | SUBLINGUAL | Status: DC | PRN

## 2020-10-05 NOTE — ED Triage Notes (Addendum)
Reports "pressure in head", chest pain, SOB, and pain between shoulder blades since last night.  Called Cardiologist office and told to come to ED.

## 2020-10-05 NOTE — ED Provider Notes (Signed)
[facility] EMERGENCY DEPARTMENT Provider Note   CSN: [id number] Arrival date & time: 10/05/20  1327     History Chief Complaint  Patient presents with   Chest Pain    Laurie James is a 75 y.o. female.   Chest Pain Associated symptoms: headache   Associated symptoms: no abdominal pain and no diaphoresis   Patient presents with headache neck and chest pain.  States that last night she developed a bad headache at around 9:00.  Progressed from her head down to her jaw to her neck and then down to the chest.  States the headache was severe.  Chest pain was still.  Headache has improved somewhat and the jaw chest and back pain are less severe than it was before.  Feels little short of breath.  No fevers.  Has a chronic cough.  Called cardiac office who told her to come in here. HPI: A 75 year old patient with a history of hypertension and hypercholesterolemia presents for evaluation of chest pain. Initial onset of pain was more than 6 hours ago. The patient's chest pain is described as heaviness/pressure/tightness and is not worse with exertion. The patient's chest pain is middle- or left-sided, is not well-localized, is not sharp and does radiate to the arms/jaw/neck. The patient does not complain of nausea and denies diaphoresis. The patient has smoked in the past 90 days. The patient has no history of stroke, has no history of peripheral artery disease, denies any history of treated diabetes, has no relevant family history of coronary artery disease (first degree relative at less than age 73) and does not have an elevated BMI (>=30).   Past Medical History:  Diagnosis Date   AMD (age related macular degeneration)    COPD (chronic obstructive pulmonary disease) (HCC)    Depression    Hiatal hernia    Intermittent low back pain    Memory impairment    Midsternal chest pain    a. 12/2011 Cardiac CTA Ca++ score of 103.3 (80th %), LAD <50p/m, RCA 50-75.    Osteoporosis    Vitamin B 12 deficiency     Patient Active Problem List   Diagnosis Date Noted   Chest pain at rest 10/05/2020   CAD (coronary artery disease) 04/24/2014   COPD (chronic obstructive pulmonary disease) (HCC) 04/24/2014    Past Surgical History:  Procedure Laterality Date   ABDOMINAL HYSTERECTOMY       OB History   No obstetric history on file.     Family History  Problem Relation Age of Onset   Breast cancer Sister    Breast cancer Sister     Social History   Tobacco Use   Smoking status: Every Day    Packs/day: 1.00    Years: 50.00    Pack years: 50.00    Types: Cigarettes   Smokeless tobacco: Never  Substance Use Topics   Alcohol use: No    Alcohol/week: 0.0 standard drinks    Comment: rarely   Drug use: No    Home Medications Prior to Admission medications   Medication Sig Start Date End Date Taking? Authorizing Provider  Aspirin-Salicylamide-Caffeine (BC HEADACHE POWDER PO) Take 1 Package by mouth daily.    [provider]  cyanocobalamin (,VITAMIN B-12,) 1000 MCG/ML injection Inject 1,000 mcg into the muscle every 30 (thirty) days. 06/11/16   [provider]  esomeprazole (NEXIUM) 40 MG capsule Take 40 mg by mouth daily at 12 noon.    [provider]  promethazine (PHENERGAN) 25 MG tablet Take 25 mg by mouth 3 (three) times daily as needed. 04/17/16   [provider]    Allergies    Codeine  Review of Systems   Review of Systems  Constitutional:  Negative for diaphoresis.  HENT:  Negative for congestion.   Respiratory:  Negative for chest tightness.   Cardiovascular:  Positive for chest pain.  Gastrointestinal:  Negative for abdominal pain.  Genitourinary:  Negative for dysuria.  Musculoskeletal:  Positive for neck pain.  Neurological:  Positive for headaches.  Psychiatric/Behavioral:  Negative for confusion.    Physical Exam Updated Vital Signs BP 124/87   Pulse 86   Temp 97.7 F (36.5 C)    Resp (!) 26   SpO2 100%   Physical Exam Vitals and nursing note reviewed.  HENT:     Head: Atraumatic.  Cardiovascular:     Rate and Rhythm: Normal rate. Rhythm irregular.  Pulmonary:     Effort: Pulmonary effort is normal.     Breath sounds: No wheezing, rhonchi or rales.  Chest:     Chest wall: No tenderness.  Abdominal:     Tenderness: There is no abdominal tenderness.  Musculoskeletal:     Right lower leg: No edema.     Left lower leg: No edema.  Skin:    General: Skin is warm.     Capillary Refill: Capillary refill takes less than 2 seconds.  Neurological:     Mental Status: She is alert.    ED Results / Procedures / Treatments   Labs (all labs ordered are listed, but only abnormal results are displayed) Labs Reviewed  CBC - Abnormal; Notable for the following components:      Result Value   WBC 11.3 (*)    RBC 3.52 (*)    Hemoglobin 11.5 (*)    HCT 34.9 (*)    All other components within normal limits  D-DIMER, QUANTITATIVE - Abnormal; Notable for the following components:   D-Dimer, Quant 2.08 (*)    All other components within normal limits  RESP PANEL BY RT-PCR (FLU A&B, COVID) [id number]  BASIC METABOLIC PANEL  HEPATIC FUNCTION PANEL  TSH  T4, FREE  BRAIN NATRIURETIC PEPTIDE  URINALYSIS, ROUTINE W REFLEX MICROSCOPIC  TROPONIN I (HIGH SENSITIVITY)  TROPONIN I (HIGH SENSITIVITY)    EKG EKG Interpretation  Date/Time:  Tuesday October 05 2020 15:13:46 EDT Ventricular Rate:  96 PR Interval:  96 QRS Duration: 100 QT Interval:  362 QTC Calculation: 458 R Axis:   73 Text Interpretation: Unknown rhythm, irregular rate Short PR interval Probable anterior infarct, age indeterminate Abnormal T, consider ischemia, diffuse leads Lateral leads are also involved more irregular than earlier today Confirmed by Benjiman Core [phone]) on 10/05/2020 3:22:09 PM  Radiology DG Chest 1 View  Result Date: 10/05/2020 CLINICAL DATA:  Chest pain EXAM: CHEST  1 VIEW  COMPARISON:  Chest CT 09/21/2020.  Chest x-ray 08/04/2013. FINDINGS: Lungs are hyperexpanded. Interstitial markings are diffusely coarsened with chronic features. The lungs are clear without focal pneumonia, edema, pneumothorax or pleural effusion. Bones are diffusely demineralized. IMPRESSION: Hyperexpansion with chronic interstitial coarsening. No acute cardiopulmonary findings. Electronically Signed   By: Kennith Center M.D.   On: 10/05/2020 15:02   CT Head Wo Contrast  Result Date: 10/05/2020 CLINICAL DATA:  Worsening headache. EXAM: CT HEAD WITHOUT CONTRAST TECHNIQUE: Contiguous axial images were obtained from the base of the skull through the vertex without intravenous contrast. COMPARISON:  July 12, 2020 FINDINGS: Brain: There is mild cerebral atrophy with widening of the extra-axial spaces and ventricular dilatation. There are areas of decreased attenuation within the white matter tracts of the supratentorial brain, consistent with microvascular disease changes. Vascular: No hyperdense vessel or unexpected calcification. Skull: Normal. Negative for fracture or focal lesion. Sinuses/Orbits: No acute finding. Other: None. IMPRESSION: 1. Generalized cerebral atrophy. 2. No acute intracranial abnormality. Electronically Signed   By: Aram Candela M.D.   On: 10/05/2020 15:55    Procedures Procedures   Medications Ordered in ED Medications - No data to display  ED Course  I have reviewed the triage vital signs and the nursing notes.  Pertinent labs & imaging results that were available during my care of the patient were reviewed by me and considered in my medical decision making (see chart for details).    MDM Rules/Calculators/A&P HEAR Score: 6                        Patient with chest pain shortness of breath.  Headache.  Head CT done reassuring.  Chest pain with nonspecific EKG changes.  Does have short PR which appears to be chronic.  Although having more frequent premature  contractions.  Recent CT showed worsening right heart disease.  Scheduled for outpatient echo on 8 July.  However felt worse today with chest pain.  Had been seen by cardiology.  Reviewing the notes it appears as if they will admit her.  Get echo as an inpatient.  Doubt pulmonary embolism Final Clinical Impression(s) / ED Diagnoses Final diagnoses:  Chest pain    Rx / DC Orders ED Discharge Orders     None        Benjiman Core, MD 10/05/20 2017

## 2020-10-05 NOTE — Consult Note (Addendum)
Cardiology Admission History and Physical:   Patient ID: Laurie James MRN: [id number]; DOB: 1945-09-21   Admission date: 10/05/2020  PCP:  Georgann Housekeeper, MD   [facility] Providers Cardiologist:  Charlton Haws, MD        Chief Complaint:  chest pain   Patient Profile:   Laurie James is a 75 y.o. female with hx of CAD on CT 2013 with ca+ score of 103, no ischemia on myoview 2016, EF 67% , smoker, macular degeneration, COPD, hiatal Herna who is being seen 10/05/2020 for the evaluation of chest pain.  History of Present Illness:   Laurie James with the above hx  including cardiac CTA in 2013 with ca+ score of 103 and moderate calcified plaque in prox to mid LAD with <50% luminal narrowing, focal mixed plaque at the origin of the RCA appears more significant, with luminal narrowing 50-75%, likely closer to 50%..rt coronary dominance.  She recently had CT of chest by Dr. Donette Larry her PCP and pt was arranged for echo and appt. and pulmonary consult   Today pt called with chest pain and directed to come to ER  Chest pain began last pm.  Radiated to her jaw and behind her shoulder blades and into shoulder.  + diaphoresis, nausea and lightheadedness.also with headache.   She was not active when it began and lasted about 3 hours.  No recent cold or fevers and is only smoking 12 cigarettes per day.  Now some mild discomfort but not anything like it was last night.  EKG:  The ECG that was done today 10/05/20 was personally reviewed and demonstrates SR with short PR and T wave inversion inf laterally that were present 07/2019. Na 138 K+ 3.7 BUN 11, Cr 0.90 GFR > 60  Hs troponin 14, 16  Hgb 11.5 WBC 11.3 plts 194   CT chest IMPRESSION: done last week and Dr. Eden Emms reviewed and pt was to see AHF and have echo.   1. Scattered small bilateral pulmonary nodules throughout both lungs are unchanged in size, number, and distribution since 2018. Findings are consistent with a  benign etiology. 2. Progressive cardiomegaly with severe right heart enlargement and reflux of contrast into the IVC and hepatic veins, consistent with right heart dysfunction. 3. Aortic Atherosclerosis (ICD10-I70.0) and Emphysema (ICD10-J43.9).  VS BP 132/82 P 85 and afebrile Resp 23   Past Medical History:  Diagnosis Date   AMD (age related macular degeneration)    COPD (chronic obstructive pulmonary disease) (HCC)    Depression    Hiatal hernia    Intermittent low back pain    Memory impairment    Midsternal chest pain    a. 12/2011 Cardiac CTA Ca++ score of 103.3 (80th %), LAD <50p/m, RCA 50-75.   Osteoporosis    Vitamin B 12 deficiency     Past Surgical History:  Procedure Laterality Date   ABDOMINAL HYSTERECTOMY       Medications Prior to Admission: Prior to Admission medications   Medication Sig Start Date End Date Taking? Authorizing Provider  Aspirin-Salicylamide-Caffeine (BC HEADACHE POWDER PO) Take 1 Package by mouth daily.    [provider]  cyanocobalamin (,VITAMIN B-12,) 1000 MCG/ML injection Inject 1,000 mcg into the muscle every 30 (thirty) days. 06/11/16   [provider]  esomeprazole (NEXIUM) 40 MG capsule Take 40 mg by mouth daily at 12 noon.    [provider]  promethazine (PHENERGAN) 25 MG tablet Take 25 mg by mouth 3 (three)  times daily as needed. 04/17/16   [provider]     Allergies:    Allergies  Allergen Reactions   Codeine Nausea And Vomiting    Social History:   Social History   Socioeconomic History   Marital status: Married    Spouse name: Not on file   Number of children: Not on file   Years of education: Not on file   Highest education level: Not on file  Occupational History   Not on file  Tobacco Use   Smoking status: Every Day    Packs/day: 1.00    Years: 50.00    Pack years: 50.00    Types: Cigarettes   Smokeless tobacco: Never  Substance and Sexual Activity   Alcohol use: No     Alcohol/week: 0.0 standard drinks    Comment: rarely   Drug use: No   Sexual activity: Not on file  Other Topics Concern   Not on file  Social History Narrative   Not on file   Social Determinants of Health   Financial Resource Strain: Not on file  Food Insecurity: Not on file  Transportation Needs: Not on file  Physical Activity: Not on file  Stress: Not on file  Social Connections: Not on file  Intimate Partner Violence: Not on file    Family History:   The patient's family history includes Breast cancer in her sister and sister.    ROS:  Please see the history of present illness.  General:no colds or fevers, no weight changes Skin:no rashes or ulcers HEENT:no blurred vision, no congestion CV:see HPI PUL:see HPI GI:no diarrhea constipation or melena, no indigestion GU:no hematuria, no dysuria MS:no joint pain, no claudication Neuro:no syncope, no lightheadedness Endo:no diabetes, no thyroid disease All other ROS reviewed and negative.     Physical Exam/Data:   Vitals:   10/05/20 1337 10/05/20 1445 10/05/20 1700 10/05/20 1730  BP: 129/77 137/68 132/82 128/80  Pulse: 70 92 84 87  Resp: 20 (!) 25 (!) 23 20  Temp: 97.7 F (36.5 C)     SpO2: 100% 100% 97% 99%   No intake or output data in the 24 hours ending 10/05/20 1821 Last 3 Weights 09/25/2019 07/28/2019 06/30/2016  Weight (lbs) 97 lb 97 lb 103 lb  Weight (kg) 43.999 kg 43.999 kg 46.72 kg     There is no height or weight on file to calculate BMI.  General:  frail female , in no acute distress HEENT: has macular degeneration and does not see well. Lymph: no adenopathy Neck: no JVD Endocrine:  No thryomegaly Vascular: No carotid bruits; FA pulses 2+ bilaterally without bruits  Cardiac:  normal S1, S2; RRR; no murmur gallup rub or click Lungs:  clear to auscultation bilaterally, no wheezing, rhonchi or rales  Abd: soft, nontender, no hepatomegaly  Ext: no lower ext edema Musculoskeletal:  No deformities, BUE  and BLE strength normal and equal Skin: warm and dry  Neuro:  alert and oriented X 3 MAE follows commands, no focal abnormalities noted Psych:  Normal affect    Relevant CV Studies: Nuc study 09/2015  Study Highlights  Nuclear stress EF: 50%. There was no ST segment deviation noted during stress. The study is normal. This is a low risk study. The left ventricular ejection fraction is mildly decreased (45-54%).   Laboratory Data:  High Sensitivity Troponin:   Recent Labs  Lab 10/05/20 1347 10/05/20 1713  TROPONINIHS 14 16      Chemistry Recent Labs  Lab 10/05/20 1347  NA 138  K 3.7  CL 105  CO2 23  GLUCOSE 98  BUN 11  CREATININE 0.90  CALCIUM 9.0  GFRNONAA >60  ANIONGAP 10    No results for input(s): PROT, ALBUMIN, AST, ALT, ALKPHOS, BILITOT in the last 168 hours. Hematology Recent Labs  Lab 10/05/20 1347  WBC 11.3*  RBC 3.52*  HGB 11.5*  HCT 34.9*  MCV 99.1  MCH 32.7  MCHC 33.0  RDW 14.6  PLT 194   BNPNo results for input(s): BNP, PROBNP in the last 168 hours.  DDimer No results for input(s): DDIMER in the last 168 hours.   Radiology/Studies:  DG Chest 1 View  Result Date: 10/05/2020 CLINICAL DATA:  Chest pain EXAM: CHEST  1 VIEW COMPARISON:  Chest CT 09/21/2020.  Chest x-ray 08/04/2013. FINDINGS: Lungs are hyperexpanded. Interstitial markings are diffusely coarsened with chronic features. The lungs are clear without focal pneumonia, edema, pneumothorax or pleural effusion. Bones are diffusely demineralized. IMPRESSION: Hyperexpansion with chronic interstitial coarsening. No acute cardiopulmonary findings. Electronically Signed   By: Kennith Center M.D.   On: 10/05/2020 15:02   CT Head Wo Contrast  Result Date: 10/05/2020 CLINICAL DATA:  Worsening headache. EXAM: CT HEAD WITHOUT CONTRAST TECHNIQUE: Contiguous axial images were obtained from the base of the skull through the vertex without intravenous contrast. COMPARISON:  July 12, 2020 FINDINGS: Brain:  There is mild cerebral atrophy with widening of the extra-axial spaces and ventricular dilatation. There are areas of decreased attenuation within the white matter tracts of the supratentorial brain, consistent with microvascular disease changes. Vascular: No hyperdense vessel or unexpected calcification. Skull: Normal. Negative for fracture or focal lesion. Sinuses/Orbits: No acute finding. Other: None. IMPRESSION: 1. Generalized cerebral atrophy. 2. No acute intracranial abnormality. Electronically Signed   By: Aram Candela M.D.   On: 10/05/2020 15:55     Assessment and Plan:   Chest pain with neg troponin and with hx of abnormal EKG  t wave inversions may be deeper but no ST elevation.  No pain now except in back of neck which is chronic. She does have hx of CAD with RCA stenosis of 50% or a little higher and mLAD <50%. On cardiac CTA in 2013.   Takes BC headache powder daily.  Will admit for OBS and check echo.   With elevated D-Dimer will change subcu heparin to IV and check CTA of chest .   CAD as above New rt heart cardiomegaly with severe Rt heart enlargement and reflux of contrast into the IVC and hepatic veins consistent with Rt hear dysfunction.  Is scheduled for echo 10/15/20 may be prudent to keep and do here but will defer to MD.   Headache with no acute process on CT head HLD with last lipids 05/2019 with LDL 91 and HDL 56 not on medication Macular degeneration with legal blindness Hx Hiatal Hernia   Risk Assessment/Risk Scores:    HEAR Score (for undifferentiated chest pain):  HEAR Score: 6       Severity of Illness: The appropriate patient status for this patient is OBSERVATION. Observation status is judged to be reasonable and necessary in order to provide the required intensity of service to ensure the patient's safety. The patient's presenting symptoms, physical exam findings, and initial radiographic and laboratory data in the context of their medical condition is felt to  place them at decreased risk for further clinical deterioration. Furthermore, it is anticipated that the patient will be medically stable for  discharge from the hospital within 2 midnights of admission. The following factors support the patient status of observation.   " The patient's presenting symptoms include chest pain . " The physical exam findings include abnormal EKG . " The initial radiographic and laboratory data are abnormal CTA of chest.   For questions or updates, please contact [facility] Please consult [email or web address] for contact info under     Signed, Setareh Rom, NP  10/05/2020 6:21 PM  Patient seen and examined with Nada Boozer, NP.  Agree as above, with the following exceptions and changes as noted below.  Ms. Manville is a 75 year old female who had severe headache, jaw and chest pain as well as interscapular pain yesterday evening which prompted presentation to the ER.  She has a history of moderate CAD on coronary CTA from 2013, and a nonischemic nuclear stress test as reported last year.  She presents today for evaluation of symptoms from yesterday.  ECG demonstrates worsening of T wave inversions previously seen inferolaterally, and a short PR interval.  High-sensitivity troponins are within normal range. ECG likely sinus with short PR.  A CT of the chest was performed approximately 2 weeks ago noting abnormalities of the heart.  Have independently reviewed these images which demonstrate severe biatrial enlargement with prominent right atrial dilation.  The right ventricle does appear dilated but perhaps mild to moderately so, the predominant feature is severe right atrial enlargement on my review of the CT though contrast timing is not optimized for the right ventricle.  She was initially going to be referred for work-up for pulmonary hypertension as an outpatient but presented to the ER.  She has not yet had an echocardiogram in our system.  On her prior CT I do not see  large central pulmonary emboli but again contrast timing was not optimized for this.  Her D-dimer came back elevated today, and with acute chest pain and evidence of recent cardiomegaly, would need to exclude PE.  She also needs a high-quality echocardiogram with estimation of right ventricular systolic pressure, evaluation for any shunts and valvular heart disease. Gen: NAD, CV: RRR, no murmurs, Lungs: clear, Abd: soft, Extrem: Warm, well perfused, no edema, Neuro/Psych: alert and oriented x 3, normal mood and affect. All available labs, radiology testing, previous records reviewed.   We will admit her to the cardiology service.  She has been started on heparin due to elevated D-dimer and concern for unstable angina.  Based on results of CT PE study and echocardiogram, we can consider right and left heart catheterization tomorrow.  Parke Poisson, MD 10/05/20 9:17 PM

## 2020-10-05 NOTE — Telephone Encounter (Signed)
Called and spoke with patient after her son called and left message regarding her having CP.  Pt reports she is having continued chest pain that started last night about 9:15 pm.  She reports it was really bad last night but she was finally able to fall asleep around 12:30 am today.  She feels the pain in her chest, through to her shoulder blades and up into her jaw.  It is alittle better now with rest but not resolved.  Advised pt she needs to report to the closest ED preferably by EMS but if not, she is not to drive herself.  She states understanding.  She has no SL Ntg on hand to use.  She has HX: COPD, current smoker, elevated Coronary CA score (2 vessel CAD)

## 2020-10-05 NOTE — Telephone Encounter (Signed)
Pt c/o of Chest Pain: STAT if CP now or developed within 24 hours  1. Are you having CP right now? yes  2. Are you experiencing any other symptoms (ex. SOB, nausea, vomiting, sweating)? SOB, but is a smoker  3. How long have you been experiencing CP? Started last night  4. Is your CP continuous or coming and going? still hurts a little bit  5. Have you taken Nitroglycerin? No  ?  Patient's son states the patient started having chest tightness last night. He states she also had pain in her head, jaw and between her shoulder blades. He states she took motrin. He states he is not currently with the patient. He states he is at work and the patient is resting at home. He states his number is: [phone]. The patient's number is: [phone]

## 2020-10-05 NOTE — ED Provider Notes (Signed)
Emergency Medicine Provider Triage Evaluation Note  Laurie James , a 75 y.o. female  was evaluated in triage.  Pt complains of chest pain that started last night.  Describes pain that radiated to her jaw, behind her shoulder blades into the bilateral shoulders.  Had some associated diaphoresis, nausea and lightheadedness.  Symptoms have improved since onset.  Review of Systems  Positive: Chest pain, diaphoresis, nausea, lightheadedness Negative: cough  Physical Exam  BP 129/77   Pulse 70   Temp 97.7 F (36.5 C)   Resp 20   SpO2 100%  Gen:   Awake, no distress   Resp:  Normal effort  MSK:   Moves extremities without difficulty   Medical Decision Making  Medically screening exam initiated at 1:44 PM.  Appropriate orders placed.  Laurie James [name] was informed that the remainder of the evaluation will be completed by another provider, this initial triage assessment does not replace that evaluation, and the importance of remaining in the ED until their evaluation is complete.     Rayne Du 10/05/20 1345    Gwyneth Sprout, MD 10/07/20 1228

## 2020-10-05 NOTE — Progress Notes (Signed)
ANTICOAGULATION CONSULT NOTE - Initial Consult  Pharmacy Consult for Heparin Indication:  VTE  Allergies  Allergen Reactions   Codeine Nausea And Vomiting    Patient Measurements: Height: 4\' 8"  (142.2 cm) Weight: 42.2 kg (93 lb 1.6 oz) IBW/kg (Calculated) : 36.3 Heparin Dosing Weight: 42.2kg  Vital Signs: Temp: 97.7 F (36.5 C) (06/28 1337) BP: 138/80 (06/28 2000) Pulse Rate: 84 (06/28 2000)  Labs: Recent Labs    10/05/20 1347 10/05/20 1713  HGB 11.5*  --   HCT 34.9*  --   PLT 194  --   CREATININE 0.90  --   TROPONINIHS 14 16    Estimated Creatinine Clearance: 31 mL/min (by C-G formula based on SCr of 0.9 mg/dL).   Medical History: Past Medical History:  Diagnosis Date   AMD (age related macular degeneration)    COPD (chronic obstructive pulmonary disease) (HCC)    Depression    Hiatal hernia    Intermittent low back pain    Memory impairment    Midsternal chest pain    a. 12/2011 Cardiac CTA Ca++ score of 103.3 (80th %), LAD <50p/m, RCA 50-75.   Osteoporosis    Vitamin B 12 deficiency     Medications:  (Not in a hospital admission)  Scheduled:   aspirin  324 mg Oral NOW   Or   aspirin  300 mg Rectal NOW   [START ON 10/06/2020] aspirin EC  81 mg Oral Daily   atorvastatin  40 mg Oral Daily   heparin  3,000 Units Intravenous Once   metoprolol tartrate  12.5 mg Oral BID   pantoprazole  40 mg Oral Daily   Infusions:   sodium chloride     heparin      Assessment: 75 yof with hx of CAD presented to ED with chest pain without ST elevation. Patient with elevated D-dimer. Cardiology is initiating heparin for VTE rule out.  Patient is not on anticoagulation prior to arrival.  Goal of Therapy:  Heparin level 0.3-0.7 units/ml Monitor platelets by anticoagulation protocol: Yes   Plan:  Give 3000 units bolus x 1 Start heparin infusion at 750 units/hr Check anti-Xa level in 8 hours and daily while on heparin Continue to monitor H&H and  platelets  10/08/2020 10/05/2020,9:07 PM

## 2020-10-05 NOTE — ED Notes (Signed)
Pt is ambulatory to the bathroom with a steady gait. 

## 2020-10-06 ENCOUNTER — Observation Stay (HOSPITAL_COMMUNITY): Payer: Medicare Other

## 2020-10-06 DIAGNOSIS — I361 Nonrheumatic tricuspid (valve) insufficiency: Secondary | ICD-10-CM

## 2020-10-06 DIAGNOSIS — R079 Chest pain, unspecified: Secondary | ICD-10-CM

## 2020-10-06 DIAGNOSIS — I2510 Atherosclerotic heart disease of native coronary artery without angina pectoris: Secondary | ICD-10-CM | POA: Diagnosis not present

## 2020-10-06 DIAGNOSIS — I2583 Coronary atherosclerosis due to lipid rich plaque: Secondary | ICD-10-CM

## 2020-10-06 DIAGNOSIS — I272 Pulmonary hypertension, unspecified: Secondary | ICD-10-CM | POA: Diagnosis not present

## 2020-10-06 DIAGNOSIS — I340 Nonrheumatic mitral (valve) insufficiency: Secondary | ICD-10-CM | POA: Diagnosis not present

## 2020-10-06 DIAGNOSIS — I071 Rheumatic tricuspid insufficiency: Secondary | ICD-10-CM | POA: Diagnosis not present

## 2020-10-06 DIAGNOSIS — I4891 Unspecified atrial fibrillation: Secondary | ICD-10-CM | POA: Diagnosis not present

## 2020-10-06 DIAGNOSIS — I351 Nonrheumatic aortic (valve) insufficiency: Secondary | ICD-10-CM | POA: Diagnosis not present

## 2020-10-06 DIAGNOSIS — I951 Orthostatic hypotension: Secondary | ICD-10-CM | POA: Diagnosis not present

## 2020-10-06 DIAGNOSIS — E7800 Pure hypercholesterolemia, unspecified: Secondary | ICD-10-CM | POA: Diagnosis not present

## 2020-10-06 LAB — LIPID PANEL
Cholesterol: 154 mg/dL (ref 0–200)
HDL: 73 mg/dL (ref >40)
LDL Cholesterol: 74 mg/dL (ref 0–99)
Total CHOL/HDL Ratio: 2.1 RATIO
Triglycerides: 35 mg/dL (ref <150)
VLDL: 7 mg/dL (ref 0–40)

## 2020-10-06 LAB — ECHOCARDIOGRAM COMPLETE
AR max vel: 1.52 cm2
AV Area VTI: 1.5 cm2
AV Area mean vel: 1.34 cm2
AV Mean grad: 2 mmHg
AV Peak grad: 4.3 mmHg
Ao pk vel: 1.04 m/s
Area-P 1/2: 3.99 cm2
Height: 56 in
S' Lateral: 3 cm
Weight: 1536 oz

## 2020-10-06 LAB — URINALYSIS, ROUTINE W REFLEX MICROSCOPIC
Bilirubin Urine: NEGATIVE
Glucose, UA: NEGATIVE mg/dL
Ketones, ur: NEGATIVE mg/dL
Leukocytes,Ua: NEGATIVE
Nitrite: NEGATIVE
Protein, ur: 30 mg/dL — AB
Specific Gravity, Urine: 1.036 — ABNORMAL HIGH (ref 1.005–1.030)
pH: 5 (ref 5.0–8.0)

## 2020-10-06 LAB — CBC
HCT: 34.5 % — ABNORMAL LOW (ref 36.0–46.0)
Hemoglobin: 11.6 g/dL — ABNORMAL LOW (ref 12.0–15.0)
MCH: 33.3 pg (ref 26.0–34.0)
MCHC: 33.6 g/dL (ref 30.0–36.0)
MCV: 99.1 fL (ref 80.0–100.0)
Platelets: 197 10*3/uL (ref 150–400)
RBC: 3.48 MIL/uL — ABNORMAL LOW (ref 3.87–5.11)
RDW: 14.8 % (ref 11.5–15.5)
WBC: 9.7 10*3/uL (ref 4.0–10.5)
nRBC: 0 % (ref 0.0–0.2)

## 2020-10-06 LAB — HEPARIN LEVEL (UNFRACTIONATED)
Heparin Unfractionated: 0.12 IU/mL — ABNORMAL LOW (ref 0.30–0.70)
Heparin Unfractionated: 0.28 IU/mL — ABNORMAL LOW (ref 0.30–0.70)

## 2020-10-06 LAB — BASIC METABOLIC PANEL
Anion gap: 7 (ref 5–15)
BUN: 10 mg/dL (ref 8–23)
CO2: 24 mmol/L (ref 22–32)
Calcium: 8.9 mg/dL (ref 8.9–10.3)
Chloride: 108 mmol/L (ref 98–111)
Creatinine, Ser: 1.01 mg/dL — ABNORMAL HIGH (ref 0.44–1.00)
GFR, Estimated: 58 mL/min — ABNORMAL LOW (ref >60)
Glucose, Bld: 135 mg/dL — ABNORMAL HIGH (ref 70–99)
Potassium: 4.2 mmol/L (ref 3.5–5.1)
Sodium: 139 mmol/L (ref 135–145)

## 2020-10-06 LAB — MRSA NEXT GEN BY PCR, NASAL: MRSA by PCR Next Gen: NOT DETECTED

## 2020-10-06 MED ORDER — HEPARIN BOLUS VIA INFUSION
2000.0000 [IU] | Freq: Once | INTRAVENOUS | Status: AC
  Administered 2020-10-06: 2000 [IU] via INTRAVENOUS
  Filled 2020-10-06 (×2): qty 2000

## 2020-10-06 MED ORDER — SODIUM CHLORIDE 0.9 % IV SOLN: INTRAVENOUS | Status: DC

## 2020-10-06 MED ORDER — SODIUM CHLORIDE 0.9% FLUSH: 3.0000 mL | INTRAVENOUS | Status: DC | PRN

## 2020-10-06 MED ORDER — SODIUM CHLORIDE 0.9 % IV SOLN: 250.0000 mL | INTRAVENOUS | Status: DC | PRN

## 2020-10-06 MED ORDER — DILTIAZEM HCL-DEXTROSE 125-5 MG/125ML-% IV SOLN (PREMIX)
5.0000 mg/h | INTRAVENOUS | Status: DC
  Administered 2020-10-06: 5 mg/h via INTRAVENOUS
  Administered 2020-10-07: 10 mg/h via INTRAVENOUS
  Filled 2020-10-06 (×2): qty 125

## 2020-10-06 MED ORDER — SODIUM CHLORIDE 0.9% FLUSH
3.0000 mL | Freq: Two times a day (BID) | INTRAVENOUS | Status: DC
  Administered 2020-10-06 – 2020-10-10 (×6): 3 mL via INTRAVENOUS

## 2020-10-06 MED ORDER — PERFLUTREN LIPID MICROSPHERE
1.0000 mL | INTRAVENOUS | Status: AC | PRN
  Administered 2020-10-06: 5 mL via INTRAVENOUS
  Filled 2020-10-06: qty 10

## 2020-10-06 NOTE — Progress Notes (Signed)
ANTICOAGULATION CONSULT NOTE  Pharmacy Consult for Heparin Indication:  VTE  Allergies  Allergen Reactions   Codeine Nausea And Vomiting    Patient Measurements: Height: 4\' 8"  (142.2 cm) Weight: 43.5 kg (96 lb) IBW/kg (Calculated) : 36.3 Heparin Dosing Weight: 42.2kg  Vital Signs: Temp: 98.5 F (36.9 C) (06/29 1327) Temp Source: Oral (06/29 1327) BP: 129/75 (06/29 1653) Pulse Rate: 149 (06/29 1653)  Labs: Recent Labs    10/05/20 1347 10/05/20 1713 10/06/20 0044 10/06/20 0640 10/06/20 1636  HGB 11.5*  --  11.6*  --   --   HCT 34.9*  --  34.5*  --   --   PLT 194  --  197  --   --   HEPARINUNFRC  --   --   --  0.12* 0.28*  CREATININE 0.90  --  1.01*  --   --   TROPONINIHS 14 16  --   --   --      Estimated Creatinine Clearance: 27.6 mL/min (A) (by C-G formula based on SCr of 1.01 mg/dL (H)).  Assessment: 16 yof with hx of CAD presented to ED with chest pain without ST elevation. Patient with elevated D-dimer. Patient was started on IV heparin for rule out PE, which CT confirmed it is negative.  Patient continues on IV heparin for Afib RVR.  Heparin level is slightly sub-therapeutic.  No issue with heparin infusion nor bleeding per RN.  Goal of Therapy:  Heparin level 0.3-0.7 units/ml Monitor platelets by anticoagulation protocol: Yes   Plan:  Increase heparin gtt to 950 units/hr F/U AM labs  Kieley Akter D. 61, PharmD, BCPS, BCCCP 10/06/2020, 6:18 PM

## 2020-10-06 NOTE — Progress Notes (Signed)
IV Team consulted for PIV consulted for PIV placement. Pt's visitors and pt informed IVT that pt is not going for procedure today per MD, but may go around 0700 10/07/20. Request was to not place PIV right now, but closer to procedure; IVT in agreement.  RN made aware.

## 2020-10-06 NOTE — Plan of Care (Signed)
  Problem: Education: Goal: Knowledge of General Education information will improve Description: Including pain rating scale, medication(s)/side effects and non-pharmacologic comfort measures Outcome: Progressing   Problem: Health Behavior/Discharge Planning: Goal: Ability to manage health-related needs will improve Outcome: Progressing   Problem: Clinical Measurements: Goal: Diagnostic test results will improve Outcome: Progressing Goal: Cardiovascular complication will be avoided Outcome: Progressing   Problem: Pain Managment: Goal: General experience of comfort will improve Outcome: Progressing   

## 2020-10-06 NOTE — H&P (Addendum)
       History and Physical  Please see note labeled  Consult from 10/05/20 at top that note is mislabeled and is the History and Physical.  The lower heading is in fact labeled Admission History and Physical.         Tanisia Yokley Flath, FNP-C At [facility]  Pgr:[phone] or after 5pm and on weekends call [phone] 10/06/2020. 

## 2020-10-06 NOTE — H&P (View-Only) (Signed)
       History and Physical  Please see note labeled  Consult from 10/05/20 at top that note is mislabeled and is the History and Physical.  The lower heading is in fact labeled Admission History and Physical.         Nada Boozer, FNP-C At [facility]  Pgr:[phone] or after 5pm and on weekends call [phone] 10/06/2020.

## 2020-10-06 NOTE — Progress Notes (Signed)
ANTICOAGULATION CONSULT NOTE   Pharmacy Consult for Heparin Indication:  VTE  Allergies  Allergen Reactions   Codeine Nausea And Vomiting    Patient Measurements: Height: 4\' 8"  (142.2 cm) Weight: 43.5 kg (96 lb) IBW/kg (Calculated) : 36.3 Heparin Dosing Weight: 42.2kg  Vital Signs: Temp: 98.2 F (36.8 C) (06/29 0306) Temp Source: Oral (06/29 0306) BP: 129/81 (06/29 0306) Pulse Rate: 93 (06/29 0306)  Labs: Recent Labs    10/05/20 1347 10/05/20 1713 10/06/20 0044 10/06/20 0640  HGB 11.5*  --  11.6*  --   HCT 34.9*  --  34.5*  --   PLT 194  --  197  --   HEPARINUNFRC  --   --   --  0.12*  CREATININE 0.90  --  1.01*  --   TROPONINIHS 14 16  --   --      Estimated Creatinine Clearance: 27.6 mL/min (A) (by C-G formula based on SCr of 1.01 mg/dL (H)).   Medical History: Past Medical History:  Diagnosis Date   AMD (age related macular degeneration)    COPD (chronic obstructive pulmonary disease) (HCC)    Depression    Hiatal hernia    Intermittent low back pain    Memory impairment    Midsternal chest pain    a. 12/2011 Cardiac CTA Ca++ score of 103.3 (80th %), LAD <50p/m, RCA 50-75.   Osteoporosis    Vitamin B 12 deficiency     Medications:  Medications Prior to Admission  Medication Sig Dispense Refill Last Dose   albuterol (VENTOLIN HFA) 108 (90 Base) MCG/ACT inhaler Inhale 2 puffs into the lungs every 4 (four) hours as needed for wheezing.   unk   Aspirin-Salicylamide-Caffeine (BC HEADACHE POWDER PO) Take 1 Package by mouth daily.   10/05/2020   citalopram (CELEXA) 20 MG tablet Take 20 mg by mouth at bedtime.   10/03/2020   cyanocobalamin (,VITAMIN B-12,) 1000 MCG/ML injection Inject 1,000 mcg into the muscle every 30 (thirty) days.  3 10/02/2020   esomeprazole (NEXIUM) 40 MG capsule Take 40 mg by mouth daily.   10/04/2020   ibuprofen (ADVIL) 200 MG tablet Take 400 mg by mouth every 6 (six) hours as needed for headache.   10/05/2020   promethazine (PHENERGAN)  25 MG tablet Take 25 mg by mouth 3 (three) times daily as needed for nausea.  1 unk   Scheduled:   aspirin  324 mg Oral NOW   Or   aspirin  300 mg Rectal NOW   aspirin EC  81 mg Oral Daily   atorvastatin  40 mg Oral Daily   metoprolol tartrate  12.5 mg Oral BID   pantoprazole  40 mg Oral Daily   Infusions:   sodium chloride     heparin 750 Units/hr (10/05/20 2216)    Assessment: 75 yof with hx of CAD presented to ED with chest pain without ST elevation. Patient with elevated D-dimer. Cardiology is initiating heparin for VTE rule out.  Patient is not on anticoagulation prior to arrival.  Initial heparin level is below goal at 0.12. No bleeding issues noted. CBC stable.   Goal of Therapy:  Heparin level 0.3-0.7 units/ml Monitor platelets by anticoagulation protocol: Yes   Plan:  Rebolus heparin 2000 units then increase infusion to 900 units/hr Recheck heparin level in 8 hours  10/07/20 PharmD., BCPS Clinical Pharmacist 10/06/2020 7:39 AM

## 2020-10-06 NOTE — H&P (View-Only) (Signed)
   Notified by RN that patient went into atrial fibrillation with RVR. Appears onset was around 3pm with HR up to 150s. Patient is asymptomatic without complaints of palpitations, chest pain, or SOB. She does not recall any prior history of this. Echo today showed EF 55-60%, G2DD, mildly reduced RV systolic function, severe biatrial enlargement, mild MR, moderate PR, and severe TR. She is awaiting LHC today though with RVR, feel she would have better images tomorrow. Dr. Turner updated to above  - Will start a diltiazem gtt for rate control.  - Continue heparin gtt  - Anticipate transition to eliquis 5mg BID following cath.  - Will make her NPO after MN for LHC tomorrow.   Rayya Yagi M. Tashea Othman, PA-C  10/06/20; 4:07 PM  

## 2020-10-06 NOTE — Progress Notes (Signed)
   Notified by RN that patient went into atrial fibrillation with RVR. Appears onset was around 3pm with HR up to 150s. Patient is asymptomatic without complaints of palpitations, chest pain, or SOB. She does not recall any prior history of this. Echo today showed EF 55-60%, G2DD, mildly reduced RV systolic function, severe biatrial enlargement, mild MR, moderate PR, and severe TR. She is awaiting LHC today though with RVR, feel she would have better images tomorrow. Dr. Mayford Knife updated to above  - Will start a diltiazem gtt for rate control.  - Continue heparin gtt  - Anticipate transition to eliquis 5mg  BID following cath.  - Will make her NPO after MN for [facility] tomorrow.   [facility], PA-C  10/06/20; 4:07 PM

## 2020-10-06 NOTE — Progress Notes (Addendum)
Progress Note  Patient Name: Laurie James Laurie James Date of Encounter: 10/06/2020  [facility] Cardiologist: Charlton Haws, MD   Subjective   No further CP.  Denies any SOB.  Chest CTA neg for acute PE.  hsTrop normal at 16 but BNp elevated at 733  Inpatient Medications    Scheduled Meds:  aspirin  324 mg Oral NOW   Or   aspirin  300 mg Rectal NOW   aspirin EC  81 mg Oral Daily   atorvastatin  40 mg Oral Daily   heparin  2,000 Units Intravenous Once   metoprolol tartrate  12.5 mg Oral BID   pantoprazole  40 mg Oral Daily   Continuous Infusions:  sodium chloride     heparin 900 Units/hr (10/06/20 0828)   PRN Meds: acetaminophen, nitroGLYCERIN, ondansetron (ZOFRAN) IV, perflutren lipid microspheres (DEFINITY) IV suspension   Vital Signs    Vitals:   10/05/20 2059 10/05/20 2210 10/06/20 0306 10/06/20 0806  BP:  (!) 151/90 129/81 130/74  Pulse:  100 93 78  Resp:  20 20 (!) 22  Temp:  98 F (36.7 C) 98.2 F (36.8 C) 97.7 F (36.5 C)  TempSrc:  Oral Oral Oral  SpO2:  99% 98% 98%  Weight: 42.2 kg 43.5 kg    Height: 4\' 8"  (1.422 m) 4\' 8"  (1.422 m)      Intake/Output Summary (Last 24 hours) at 10/06/2020 1052 Last data filed at 10/06/2020 0851 Gross per 24 hour  Intake 150 ml  Output 1 ml  Net 149 ml   Last 3 Weights 10/05/2020 10/05/2020 09/25/2019  Weight (lbs) 96 lb 93 lb 1.6 oz 97 lb  Weight (kg) 43.545 kg 42.23 kg 43.999 kg      Telemetry    NSR - Personally Reviewed  ECG    NSR with short PR interval, PACs and  nonspecific T wave abnormality- Personally Reviewed  Physical Exam   GEN: No acute distress.   Neck: No JVD Cardiac: RRR, no murmurs, rubs, or gallops.  Respiratory: Clear to auscultation bilaterally. GI: Soft, nontender, non-distended  MS: No edema; No deformity. Neuro:  Nonfocal  Psych: Normal affect   Labs    High Sensitivity Troponin:   Recent Labs  Lab 10/05/20 1347 10/05/20 1713  TROPONINIHS 14 16       Chemistry Recent Labs  Lab 10/05/20 1347 10/05/20 1900 10/06/20 0044  NA 138  --  139  K 3.7  --  4.2  CL 105  --  108  CO2 23  --  24  GLUCOSE 98  --  135*  BUN 11  --  10  CREATININE 0.90  --  1.01*  CALCIUM 9.0  --  8.9  PROT  --  7.3  --   ALBUMIN  --  3.3*  --   AST  --  19  --   ALT  --  8  --   ALKPHOS  --  84  --   BILITOT  --  1.0  --   GFRNONAA >60  --  58*  ANIONGAP 10  --  7     Hematology Recent Labs  Lab 10/05/20 1347 10/06/20 0044  WBC 11.3* 9.7  RBC 3.52* 3.48*  HGB 11.5* 11.6*  HCT 34.9* 34.5*  MCV 99.1 99.1  MCH 32.7 33.3  MCHC 33.0 33.6  RDW 14.6 14.8  PLT 194 197    BNP Recent Labs  Lab 10/05/20 1902  BNP 733.2*  DDimer  Recent Labs  Lab 10/05/20 1900  DDIMER 2.08*     Radiology    DG Chest 1 View  Result Date: 10/05/2020 CLINICAL DATA:  Chest pain EXAM: CHEST  1 VIEW COMPARISON:  Chest CT 09/21/2020.  Chest x-ray 08/04/2013. FINDINGS: Lungs are hyperexpanded. Interstitial markings are diffusely coarsened with chronic features. The lungs are clear without focal pneumonia, edema, pneumothorax or pleural effusion. Bones are diffusely demineralized. IMPRESSION: Hyperexpansion with chronic interstitial coarsening. No acute cardiopulmonary findings. Electronically Signed   By: Kennith Center M.D.   On: 10/05/2020 15:02   CT Head Wo Contrast  Result Date: 10/05/2020 CLINICAL DATA:  Worsening headache. EXAM: CT HEAD WITHOUT CONTRAST TECHNIQUE: Contiguous axial images were obtained from the base of the skull through the vertex without intravenous contrast. COMPARISON:  July 12, 2020 FINDINGS: Brain: There is mild cerebral atrophy with widening of the extra-axial spaces and ventricular dilatation. There are areas of decreased attenuation within the white matter tracts of the supratentorial brain, consistent with microvascular disease changes. Vascular: No hyperdense vessel or unexpected calcification. Skull: Normal. Negative for fracture  or focal lesion. Sinuses/Orbits: No acute finding. Other: None. IMPRESSION: 1. Generalized cerebral atrophy. 2. No acute intracranial abnormality. Electronically Signed   By: Aram Candela M.D.   On: 10/05/2020 15:55   CT Angio Chest Pulmonary Embolism (PE) W or WO Contrast  Result Date: 10/05/2020 CLINICAL DATA:  Positive D-dimer EXAM: CT ANGIOGRAPHY CHEST WITH CONTRAST TECHNIQUE: Multidetector CT imaging of the chest was performed using the standard protocol during bolus administration of intravenous contrast. Multiplanar CT image reconstructions and MIPs were obtained to evaluate the vascular anatomy. CONTRAST:  57mL OMNIPAQUE IOHEXOL 350 MG/ML SOLN COMPARISON:  10/21/2020 FINDINGS: Cardiovascular: No filling defects in the pulmonary arteries to suggest pulmonary emboli. Coronary artery and aortic calcifications. Heart is borderline in size. No aneurysm. Mediastinum/Nodes: No mediastinal, hilar, or axillary adenopathy. Trachea and esophagus are unremarkable. Thyroid unremarkable. Lungs/Pleura: Moderate centrilobular emphysema. Extensive scarring in the apices bilaterally, stable since prior study. Scattered bilateral pulmonary nodules throughout both lungs, stable. No effusions. No acute confluent opacities. Upper Abdomen: Imaging into the upper abdomen demonstrates no acute findings. Musculoskeletal: Chest wall soft tissues are unremarkable. No acute bony abnormality. Review of the MIP images confirms the above findings. IMPRESSION: No evidence of pulmonary embolus. Scattered bilateral small pulmonary nodules are unchanged in size, number and appearance since prior study and dating back to 2018. Cardiomegaly, coronary artery disease. Aortic Atherosclerosis (ICD10-I70.0) and Emphysema (ICD10-J43.9). Electronically Signed   By: Charlett Nose M.D.   On: 10/05/2020 21:38   ECHOCARDIOGRAM COMPLETE  Result Date: 10/06/2020    ECHOCARDIOGRAM REPORT   Patient Name:   Laurie James Laurie James Date of Exam:  10/06/2020 Medical Rec #:  [id number]                   Height:       56.0 in Accession #:    [id number]                  Weight:       96.0 lb Date of Birth:  1946/03/28                   BSA:          1.299 m Patient Age:    75 years                    BP:  129/81 mmHg Patient Gender: F                           HR:           93 bpm. Exam Location:  Inpatient Procedure: 2D Echo, Color Doppler and Cardiac Doppler Indications:    Chest pain  History:        Patient has no prior history of Echocardiogram examinations.                 COPD; Risk Factors:Current Smoker.  Sonographer:    Shirlean Kelly Referring Phys: 90 2nd Dr. Mercadel  Sonographer Comments: Technically difficult study due to poor echo windows. IMPRESSIONS  1. Left ventricular ejection fraction, by estimation, is 55 to 60%. The left ventricle has normal function. The left ventricle has no regional wall motion abnormalities. Left ventricular diastolic parameters are consistent with Grade II diastolic dysfunction (pseudonormalization).  2. Right ventricular systolic function is mildly reduced. The right ventricular size is moderately enlarged. There is mildly elevated pulmonary artery systolic pressure. The estimated right ventricular systolic pressure is 44.2 mmHg.  3. Left atrial size was severely dilated.  4. Right atrial size was severely dilated.  5. The mitral valve is normal in structure. Mild mitral valve regurgitation. No evidence of mitral stenosis.  6. Tricuspid valve regurgitation is severe.  7. The aortic valve is grossly normal. There is mild calcification of the aortic valve. Aortic valve regurgitation is mild. No aortic stenosis is present.  8. Pulmonic valve regurgitation is moderate.  9. The inferior vena cava is dilated in size with <50% respiratory variability, suggesting right atrial pressure of 15 mmHg. FINDINGS  Left Ventricle: Left ventricular ejection fraction, by estimation, is 55 to 60%. The left ventricle has normal  function. The left ventricle has no regional wall motion abnormalities. The left ventricular internal cavity size was normal in size. There is  no left ventricular hypertrophy. Left ventricular diastolic parameters are consistent with Grade II diastolic dysfunction (pseudonormalization). Right Ventricle: The right ventricular size is moderately enlarged. No increase in right ventricular wall thickness. Right ventricular systolic function is mildly reduced. There is mildly elevated pulmonary artery systolic pressure. The tricuspid regurgitant velocity is 2.70 m/s, and with an assumed right atrial pressure of 15 mmHg, the estimated right ventricular systolic pressure is 44.2 mmHg. Left Atrium: Left atrial size was severely dilated. Right Atrium: Right atrial size was severely dilated. Pericardium: There is no evidence of pericardial effusion. Mitral Valve: The mitral valve is normal in structure. Mild mitral valve regurgitation. No evidence of mitral valve stenosis. Tricuspid Valve: The tricuspid valve is grossly normal. Tricuspid valve regurgitation is severe. No evidence of tricuspid stenosis. Aortic Valve: The aortic valve is grossly normal. There is mild calcification of the aortic valve. Aortic valve regurgitation is mild. No aortic stenosis is present. Aortic valve mean gradient measures 2.0 mmHg. Aortic valve peak gradient measures 4.3 mmHg. Aortic valve area, by VTI measures 1.50 cm. Pulmonic Valve: The pulmonic valve was normal in structure. Pulmonic valve regurgitation is moderate. No evidence of pulmonic stenosis. Aorta: The aortic root is normal in size and structure. Venous: The inferior vena cava is dilated in size with less than 50% respiratory variability, suggesting right atrial pressure of 15 mmHg. The inferior vena cava and the hepatic vein show a pattern of systolic flow reversal, suggestive of tricuspid regurgitation. IAS/Shunts: No atrial level shunt detected by color flow Doppler.  LEFT  VENTRICLE PLAX 2D LVIDd:         4.00 cm  Diastology LVIDs:         3.00 cm  LV e' medial:    6.31 cm/s LV PW:         1.00 cm  LV E/e' medial:  9.5 LV IVS:        0.80 cm  LV e' lateral:   7.18 cm/s LVOT diam:     1.90 cm  LV E/e' lateral: 8.4 LV SV:         29 LV SV Index:   22 LVOT Area:     2.84 cm  RIGHT VENTRICLE             IVC RV Basal diam:  3.70 cm     IVC diam: 2.20 cm RV S prime:     11.70 cm/s LEFT ATRIUM             Index       RIGHT ATRIUM           Index LA diam:        3.30 cm 2.54 cm/m  RA Area:     25.50 cm LA Vol (A2C):   80.0 ml 61.56 ml/m RA Volume:   90.40 ml  69.57 ml/m LA Vol (A4C):   88.7 ml 68.26 ml/m LA Biplane Vol: 84.4 ml 64.95 ml/m  AORTIC VALVE AV Area (Vmax):    1.52 cm AV Area (Vmean):   1.34 cm AV Area (VTI):     1.50 cm AV Vmax:           104.00 cm/s AV Vmean:          69.900 cm/s AV VTI:            0.191 m AV Peak Grad:      4.3 mmHg AV Mean Grad:      2.0 mmHg LVOT Vmax:         55.60 cm/s LVOT Vmean:        33.000 cm/s LVOT VTI:          0.101 m LVOT/AV VTI ratio: 0.53  AORTA Ao Root diam: 2.60 cm Ao Asc diam:  3.00 cm MITRAL VALVE               TRICUSPID VALVE MV Area (PHT): 3.99 cm    TR Peak grad:   29.2 mmHg MV Decel Time: 190 msec    TR Vmax:        270.00 cm/s MV E velocity: 60.20 cm/s MV A velocity: 59.50 cm/s  SHUNTS MV E/A ratio:  1.01        Systemic VTI:  0.10 m                            Systemic Diam: 1.90 cm Weston BrassGayatri Acharya MD Electronically signed by Weston BrassGayatri Acharya MD Signature Date/Time: 10/06/2020/10:39:24 AM    Final     Cardiac Studies   2D echo 10/06/2020 IMPRESSIONS    1. Left ventricular ejection fraction, by estimation, is 55 to 60%. The  left ventricle has normal function. The left ventricle has no regional  wall motion abnormalities. Left ventricular diastolic parameters are  consistent with Grade II diastolic  dysfunction (pseudonormalization).   2. Right ventricular systolic function is mildly reduced. The right  ventricular  size is moderately enlarged. There is mildly elevated  pulmonary artery systolic pressure. The estimated right ventricular  systolic pressure is 44.2 mmHg.   3. Left atrial size was severely dilated.   4. Right atrial size was severely dilated.   5. The mitral valve is normal in structure. Mild mitral valve  regurgitation. No evidence of mitral stenosis.   6. Tricuspid valve regurgitation is severe.   7. The aortic valve is grossly normal. There is mild calcification of the  aortic valve. Aortic valve regurgitation is mild. No aortic stenosis is  present.   8. Pulmonic valve regurgitation is moderate.   9. The inferior vena cava is dilated in size with <50% respiratory  variability, suggesting right atrial pressure of 15 mmHg.   Patient Profile     76 y.o. female with hx of CAD on CT 2013 with ca+ score of 103, no ischemia on myoview 2016, EF 67% , smoker, macular degeneration, COPD, hiatal Herna who is being seen 10/05/2020 for the evaluation of chest pain.  Assessment & Plan    Chest pain -CP concerning given that it radiated into her neck and jaw as well as shoulder blades and was associated with nausea and diaphoresis -hsTrop normal -her CRF include ongoing tobacco use -coronary CTA in 2013 showed a coronary Ca score of 103 with moderate calcified plauqe in the prox to mid LAD < 50% and focal mixed plaque in the oRCA 50-70%.   -had Chest CT 2 weeks ago for SOB and had been referred to Pulmonary for PHTN and right sided heart enlargement -2D echo this admit confirms right sided enlargement with moderate RVE and mild RV dysfunction, severe BAE, severe TR, moderate PR and mild to moderate PHTN with PASP .   -Repeat Chest CTA this admit showed no PE but did show CAD and emphysema -recommend right and left heart cath today to redefine coronary anatomy and assess right sided pressures and rule out shunt -Shared Decision Making/Informed Consent The risks [stroke (1 in 1000), death (1  in 1000), kidney failure [usually temporary] (1 in 500), bleeding (1 in 200), allergic reaction [possibly serious] (1 in 200)], benefits (diagnostic support and management of coronary artery disease) and alternatives of a cardiac catheterization were discussed in detail with Ms. Levitan and she is willing to proceed.   Pulmonary HTN/Right heart enlargement/Severe TR -this was noted on echo associated with dilated right heart and severe TR -as above will plan right and left heart cath today -if no shunt and filling pressures are normal then need to consider other etiologies of PHTN including rheumatologic, sleep apnea and get PFTs given findings of emphysema on Chest CT  ASCAD -coronary CTA in 2013 showed coronary Ca score of 103 with moderate calcified plauqe in the prox to mid LAD < 50% and focal mixed plaque in the oRCA 50-70%.   -now with chest pain worrisome for angina>>hsTro normal and LVF normal on echo -plan for right and left heart cath today -continue IV heparin gtt for now -continue ASA, statin, BB  HLD -LDL goal < 70 -LDL 74 this admission -will increase atorvastatin to 80mg  daily -will need followup FLP and ALT in 6 weeks  I have spent a total of 35 minutes with patient reviewing 2D echo, coronary CTA from 2013, Chest CTA this admit , telemetry, EKGs, labs and examining patient as well as establishing an assessment and plan that was discussed with the patient.  > 50% of time was spent in direct patient care.     For questions or updates, please contact [facility] Please consult [email or web address] for contact  info under        Signed, Armanda Magic, MD  10/06/2020, 10:52 AM

## 2020-10-07 ENCOUNTER — Encounter (HOSPITAL_COMMUNITY)
Admission: EM | Disposition: A | Discharge status: Home / Self Care | Payer: Self-pay | Source: Home / Self Care | Attending: Cardiology

## 2020-10-07 ENCOUNTER — Ambulatory Visit: Payer: Medicare Other | Admitting: Cardiovascular Disease

## 2020-10-07 ENCOUNTER — Other Ambulatory Visit (HOSPITAL_COMMUNITY): Payer: Self-pay

## 2020-10-07 ENCOUNTER — Encounter (HOSPITAL_COMMUNITY): Payer: Self-pay | Admitting: Cardiovascular Disease

## 2020-10-07 DIAGNOSIS — E7800 Pure hypercholesterolemia, unspecified: Secondary | ICD-10-CM | POA: Diagnosis not present

## 2020-10-07 DIAGNOSIS — I2510 Atherosclerotic heart disease of native coronary artery without angina pectoris: Secondary | ICD-10-CM | POA: Diagnosis not present

## 2020-10-07 DIAGNOSIS — J432 Centrilobular emphysema: Secondary | ICD-10-CM | POA: Diagnosis not present

## 2020-10-07 DIAGNOSIS — I4819 Other persistent atrial fibrillation: Secondary | ICD-10-CM | POA: Diagnosis present

## 2020-10-07 DIAGNOSIS — Z79899 Other long term (current) drug therapy: Secondary | ICD-10-CM | POA: Diagnosis not present

## 2020-10-07 DIAGNOSIS — E869 Volume depletion, unspecified: Secondary | ICD-10-CM | POA: Diagnosis present

## 2020-10-07 DIAGNOSIS — I071 Rheumatic tricuspid insufficiency: Secondary | ICD-10-CM | POA: Diagnosis not present

## 2020-10-07 DIAGNOSIS — I2583 Coronary atherosclerosis due to lipid rich plaque: Secondary | ICD-10-CM | POA: Diagnosis not present

## 2020-10-07 DIAGNOSIS — I951 Orthostatic hypotension: Secondary | ICD-10-CM

## 2020-10-07 DIAGNOSIS — Z20822 Contact with and (suspected) exposure to covid-19: Secondary | ICD-10-CM | POA: Diagnosis present

## 2020-10-07 DIAGNOSIS — M810 Age-related osteoporosis without current pathological fracture: Secondary | ICD-10-CM | POA: Diagnosis present

## 2020-10-07 DIAGNOSIS — I4891 Unspecified atrial fibrillation: Secondary | ICD-10-CM

## 2020-10-07 DIAGNOSIS — R7989 Other specified abnormal findings of blood chemistry: Secondary | ICD-10-CM | POA: Diagnosis present

## 2020-10-07 DIAGNOSIS — J439 Emphysema, unspecified: Secondary | ICD-10-CM | POA: Diagnosis present

## 2020-10-07 DIAGNOSIS — E876 Hypokalemia: Secondary | ICD-10-CM | POA: Diagnosis present

## 2020-10-07 DIAGNOSIS — I25111 Atherosclerotic heart disease of native coronary artery with angina pectoris with documented spasm: Secondary | ICD-10-CM | POA: Diagnosis present

## 2020-10-07 DIAGNOSIS — I959 Hypotension, unspecified: Secondary | ICD-10-CM | POA: Diagnosis present

## 2020-10-07 DIAGNOSIS — Z885 Allergy status to narcotic agent status: Secondary | ICD-10-CM | POA: Diagnosis not present

## 2020-10-07 DIAGNOSIS — R079 Chest pain, unspecified: Secondary | ICD-10-CM | POA: Diagnosis not present

## 2020-10-07 DIAGNOSIS — F17210 Nicotine dependence, cigarettes, uncomplicated: Secondary | ICD-10-CM | POA: Diagnosis present

## 2020-10-07 DIAGNOSIS — I272 Pulmonary hypertension, unspecified: Secondary | ICD-10-CM | POA: Diagnosis present

## 2020-10-07 DIAGNOSIS — H3530 Unspecified macular degeneration: Secondary | ICD-10-CM | POA: Diagnosis present

## 2020-10-07 DIAGNOSIS — I10 Essential (primary) hypertension: Secondary | ICD-10-CM | POA: Diagnosis present

## 2020-10-07 HISTORY — PX: RIGHT/LEFT HEART CATH AND CORONARY ANGIOGRAPHY: CATH118266

## 2020-10-07 LAB — POCT I-STAT EG7
Acid-base deficit: 1 mmol/L (ref 0.0–2.0)
Acid-base deficit: 1 mmol/L (ref 0.0–2.0)
Bicarbonate: 24.2 mmol/L (ref 20.0–28.0)
Bicarbonate: 24.9 mmol/L (ref 20.0–28.0)
Calcium, Ion: 1.18 mmol/L (ref 1.15–1.40)
Calcium, Ion: 1.22 mmol/L (ref 1.15–1.40)
HCT: 33 % — ABNORMAL LOW (ref 36.0–46.0)
HCT: 36 % (ref 36.0–46.0)
Hemoglobin: 11.2 g/dL — ABNORMAL LOW (ref 12.0–15.0)
Hemoglobin: 12.2 g/dL (ref 12.0–15.0)
O2 Saturation: 51 %
O2 Saturation: 52 %
Potassium: 3.5 mmol/L (ref 3.5–5.1)
Potassium: 3.5 mmol/L (ref 3.5–5.1)
Sodium: 137 mmol/L (ref 135–145)
Sodium: 138 mmol/L (ref 135–145)
TCO2: 26 mmol/L (ref 22–32)
TCO2: 26 mmol/L (ref 22–32)
pCO2, Ven: 43.5 mmHg — ABNORMAL LOW (ref 44.0–60.0)
pCO2, Ven: 44.6 mmHg (ref 44.0–60.0)
pH, Ven: 7.353 (ref 7.250–7.430)
pH, Ven: 7.355 (ref 7.250–7.430)
pO2, Ven: 29 mmHg — CL (ref 32.0–45.0)
pO2, Ven: 29 mmHg — CL (ref 32.0–45.0)

## 2020-10-07 LAB — POCT I-STAT 7, (LYTES, BLD GAS, ICA,H+H)
Acid-base deficit: 2 mmol/L (ref 0.0–2.0)
Bicarbonate: 22.3 mmol/L (ref 20.0–28.0)
Calcium, Ion: 1.14 mmol/L — ABNORMAL LOW (ref 1.15–1.40)
HCT: 29 % — ABNORMAL LOW (ref 36.0–46.0)
Hemoglobin: 9.9 g/dL — ABNORMAL LOW (ref 12.0–15.0)
O2 Saturation: 100 %
Potassium: 3.5 mmol/L (ref 3.5–5.1)
Sodium: 137 mmol/L (ref 135–145)
TCO2: 23 mmol/L (ref 22–32)
pCO2 arterial: 35.9 mmHg (ref 32.0–48.0)
pH, Arterial: 7.4 (ref 7.350–7.450)
pO2, Arterial: 182 mmHg — ABNORMAL HIGH (ref 83.0–108.0)

## 2020-10-07 LAB — CBC
HCT: 31.2 % — ABNORMAL LOW (ref 36.0–46.0)
Hemoglobin: 10.4 g/dL — ABNORMAL LOW (ref 12.0–15.0)
MCH: 32.4 pg (ref 26.0–34.0)
MCHC: 33.3 g/dL (ref 30.0–36.0)
MCV: 97.2 fL (ref 80.0–100.0)
Platelets: 176 10*3/uL (ref 150–400)
RBC: 3.21 MIL/uL — ABNORMAL LOW (ref 3.87–5.11)
RDW: 14.6 % (ref 11.5–15.5)
WBC: 8.2 10*3/uL (ref 4.0–10.5)
nRBC: 0 % (ref 0.0–0.2)

## 2020-10-07 LAB — HEMOGLOBIN A1C
Hgb A1c MFr Bld: 5.2 % (ref 4.8–5.6)
Mean Plasma Glucose: 103 mg/dL

## 2020-10-07 LAB — HEPARIN LEVEL (UNFRACTIONATED): Heparin Unfractionated: 0.21 IU/mL — ABNORMAL LOW (ref 0.30–0.70)

## 2020-10-07 SURGERY — RIGHT/LEFT HEART CATH AND CORONARY ANGIOGRAPHY
Anesthesia: LOCAL

## 2020-10-07 MED ORDER — HEPARIN (PORCINE) IN NACL 1000-0.9 UT/500ML-% IV SOLN
INTRAVENOUS | Status: AC
  Filled 2020-10-07: qty 1500

## 2020-10-07 MED ORDER — DILTIAZEM HCL 60 MG PO TABS
30.0000 mg | ORAL_TABLET | Freq: Four times a day (QID) | ORAL | Status: DC
  Administered 2020-10-07 – 2020-10-08 (×4): 30 mg via ORAL
  Filled 2020-10-07 (×4): qty 1

## 2020-10-07 MED ORDER — FENTANYL CITRATE (PF) 100 MCG/2ML IJ SOLN
INTRAMUSCULAR | Status: DC | PRN
  Administered 2020-10-07: 25 ug via INTRAVENOUS

## 2020-10-07 MED ORDER — HEPARIN SODIUM (PORCINE) 1000 UNIT/ML IJ SOLN
INTRAMUSCULAR | Status: DC | PRN
  Administered 2020-10-07: 2500 [IU] via INTRAVENOUS

## 2020-10-07 MED ORDER — IOHEXOL 350 MG/ML SOLN
INTRAVENOUS | Status: DC | PRN
  Administered 2020-10-07: 45 mL via INTRA_ARTERIAL

## 2020-10-07 MED ORDER — SODIUM CHLORIDE 0.9 % IV SOLN: INTRAVENOUS | Status: AC

## 2020-10-07 MED ORDER — HEPARIN (PORCINE) IN NACL 1000-0.9 UT/500ML-% IV SOLN
INTRAVENOUS | Status: DC | PRN
  Administered 2020-10-07 (×3): 500 mL

## 2020-10-07 MED ORDER — MIDAZOLAM HCL 2 MG/2ML IJ SOLN
INTRAMUSCULAR | Status: DC | PRN
  Administered 2020-10-07: 1 mg via INTRAVENOUS

## 2020-10-07 MED ORDER — SODIUM CHLORIDE 0.9% FLUSH: 3.0000 mL | INTRAVENOUS | Status: DC | PRN

## 2020-10-07 MED ORDER — LABETALOL HCL 5 MG/ML IV SOLN: 10.0000 mg | INTRAVENOUS | Status: AC | PRN

## 2020-10-07 MED ORDER — MIDAZOLAM HCL 2 MG/2ML IJ SOLN
INTRAMUSCULAR | Status: AC
  Filled 2020-10-07: qty 2

## 2020-10-07 MED ORDER — VERAPAMIL HCL 2.5 MG/ML IV SOLN
INTRAVENOUS | Status: AC
  Filled 2020-10-07: qty 2

## 2020-10-07 MED ORDER — SODIUM CHLORIDE 0.9 % IV SOLN: 250.0000 mL | INTRAVENOUS | Status: DC | PRN

## 2020-10-07 MED ORDER — LIDOCAINE HCL (PF) 1 % IJ SOLN
INTRAMUSCULAR | Status: DC | PRN
  Administered 2020-10-07 (×2): 2 mL

## 2020-10-07 MED ORDER — SODIUM CHLORIDE 0.9 % IV SOLN: INTRAVENOUS | Status: DC

## 2020-10-07 MED ORDER — SODIUM CHLORIDE 0.9% FLUSH
3.0000 mL | Freq: Two times a day (BID) | INTRAVENOUS | Status: DC
  Administered 2020-10-07 – 2020-10-11 (×6): 3 mL via INTRAVENOUS

## 2020-10-07 MED ORDER — HEPARIN SODIUM (PORCINE) 1000 UNIT/ML IJ SOLN
INTRAMUSCULAR | Status: AC
  Filled 2020-10-07: qty 1

## 2020-10-07 MED ORDER — ALBUTEROL SULFATE (2.5 MG/3ML) 0.083% IN NEBU: 3.0000 mL | INHALATION_SOLUTION | RESPIRATORY_TRACT | Status: DC | PRN

## 2020-10-07 MED ORDER — APIXABAN 5 MG PO TABS
5.0000 mg | ORAL_TABLET | Freq: Two times a day (BID) | ORAL | Status: DC
  Administered 2020-10-07 – 2020-10-11 (×8): 5 mg via ORAL
  Filled 2020-10-07 (×8): qty 1

## 2020-10-07 MED ORDER — FENTANYL CITRATE (PF) 100 MCG/2ML IJ SOLN
INTRAMUSCULAR | Status: AC
  Filled 2020-10-07: qty 2

## 2020-10-07 MED ORDER — ONDANSETRON HCL 4 MG/2ML IJ SOLN: 4.0000 mg | Freq: Four times a day (QID) | INTRAMUSCULAR | Status: DC | PRN

## 2020-10-07 MED ORDER — VERAPAMIL HCL 2.5 MG/ML IV SOLN
INTRA_ARTERIAL | Status: DC | PRN
  Administered 2020-10-07: 7 mL via INTRA_ARTERIAL

## 2020-10-07 MED ORDER — SODIUM CHLORIDE 0.9 % IV BOLUS
250.0000 mL | Freq: Once | INTRAVENOUS | Status: AC
  Administered 2020-10-07: 250 mL via INTRAVENOUS

## 2020-10-07 MED ORDER — HYDRALAZINE HCL 20 MG/ML IJ SOLN: 10.0000 mg | INTRAMUSCULAR | Status: AC | PRN

## 2020-10-07 MED ORDER — LIDOCAINE HCL (PF) 1 % IJ SOLN
INTRAMUSCULAR | Status: AC
  Filled 2020-10-07: qty 30

## 2020-10-07 MED ORDER — ACETAMINOPHEN 325 MG PO TABS: 650.0000 mg | ORAL_TABLET | ORAL | Status: DC | PRN

## 2020-10-07 MED ORDER — NITROGLYCERIN 1 MG/10 ML FOR IR/CATH LAB
INTRA_ARTERIAL | Status: AC
  Filled 2020-10-07: qty 10

## 2020-10-07 SURGICAL SUPPLY — 14 items
CATH BALLN WEDGE 5F 110CM (CATHETERS) ×1 IMPLANT
CATH INFINITI 5FR ANG PIGTAIL (CATHETERS) ×1 IMPLANT
CATH OPTITORQUE TIG 4.0 5F (CATHETERS) ×1 IMPLANT
DEVICE RAD COMP TR BAND LRG (VASCULAR PRODUCTS) ×1 IMPLANT
GLIDESHEATH SLEND A-KIT 6F 22G (SHEATH) ×1 IMPLANT
KIT HEART LEFT (KITS) ×2 IMPLANT
PACK CARDIAC CATHETERIZATION (CUSTOM PROCEDURE TRAY) ×2 IMPLANT
SHEATH GLIDE SLENDER 4/5FR (SHEATH) ×1 IMPLANT
TRANSDUCER W/STOPCOCK (MISCELLANEOUS) ×2 IMPLANT
TUBING CIL FLEX 10 FLL-RA (TUBING) ×2 IMPLANT
WIRE EMERALD 3MM-J .035X150CM (WIRE) IMPLANT
WIRE EMERALD ST .035X260CM (WIRE) IMPLANT
WIRE HI TORQ VERSACORE-J 145CM (WIRE) ×1 IMPLANT
WIRE VERSACORE LOC 115CM (WIRE) ×1 IMPLANT

## 2020-10-07 NOTE — Discharge Instructions (Signed)

## 2020-10-07 NOTE — Interval H&P Note (Signed)
Cath Lab Visit (complete for each Cath Lab visit)  Clinical Evaluation Leading to the Procedure:   ACS: Yes.    Non-ACS:    Anginal Classification: CCS II  Anti-ischemic medical therapy: No Therapy  Non-Invasive Test Results: No non-invasive testing performed  Prior CABG: No previous CABG      History and Physical Interval Note:  10/07/2020 8:31 AM  Laurie James  has presented today for surgery, with the diagnosis of unstable angina.  The various methods of treatment have been discussed with the patient and family. After consideration of risks, benefits and other options for treatment, the patient has consented to  Procedure(s): RIGHT/LEFT HEART CATH AND CORONARY ANGIOGRAPHY (N/A) as a surgical intervention.  The patient's history has been reviewed, patient examined, no change in status, stable for surgery.  I have reviewed the patient's chart and labs.  Questions were answered to the patient's satisfaction.     [name]

## 2020-10-07 NOTE — Progress Notes (Signed)
   10/06/20 1954  Assess: MEWS Score  Pulse Rate (!) 118  ECG Heart Rate (!) 133  Resp (!) 23  SpO2 97 %  Assess: MEWS Score  MEWS Temp 0  MEWS Systolic 0  MEWS Pulse 3  MEWS RR 1  MEWS LOC 0  MEWS Score 4  MEWS Score Color Red  Assess: if the MEWS score is Yellow or Red  Were vital signs taken at a resting state? No  Focused Assessment No change from prior assessment  Early Detection of Sepsis Score *See Row Information* Low  MEWS guidelines implemented *See Row Information* Yes  Treat  MEWS Interventions Administered scheduled meds/treatments  Pain Scale 0-10  Pain Score 0  Take Vital Signs  Increase Vital Sign Frequency  Red: Q 1hr X 4 then Q 4hr X 4, if remains red, continue Q 4hrs  Escalate  MEWS: Escalate Red: discuss with charge nurse/RN and provider, consider discussing with RRT  Document  Patient Outcome Stabilized after interventions  Progress note created (see row info) Yes

## 2020-10-07 NOTE — TOC Benefit Eligibility Note (Signed)
Patient Product/process development scientist completed.    The patient is currently admitted and upon discharge could be taking Eliquis 5 mg.  The current 30 day co-pay is, $142.00, due to a $95.00 deductible.  The next mouth should be $47.00.   The patient is currently admitted and upon discharge could be taking Xarelto 20 mg.  The current 30 day co-pay is, $142.00, due to a $95.00 deductible.  The next mouth should be $47.00.   The patient is insured through [employer]     Roland Earl, CPhT Pharmacy Patient Advocate Specialist [facility] Antimicrobial Stewardship Team Direct Number: [phone]  Fax: [phone]

## 2020-10-07 NOTE — Progress Notes (Signed)
ANTICOAGULATION CONSULT NOTE  Pharmacy Consult for Heparin>>apixaban Indication: Afib  Allergies  Allergen Reactions   Codeine Nausea And Vomiting    Patient Measurements: Height: 4\' 8"  (142.2 cm) Weight: 43.5 kg (96 lb) IBW/kg (Calculated) : 36.3 Heparin Dosing Weight: 42.2kg  Vital Signs: Temp: 97.8 F (36.6 C) (06/30 1100) Temp Source: Oral (06/30 1100) BP: 103/64 (06/30 1200) Pulse Rate: 77 (06/30 1200)  Labs: Recent Labs    10/05/20 1347 10/05/20 1713 10/06/20 0044 10/06/20 0640 10/06/20 1636 10/07/20 0010  HGB 11.5*  --  11.6*  --   --  10.4*  HCT 34.9*  --  34.5*  --   --  31.2*  PLT 194  --  197  --   --  176  HEPARINUNFRC  --   --   --  0.12* 0.28* 0.21*  CREATININE 0.90  --  1.01*  --   --   --   TROPONINIHS 14 16  --   --   --   --      Estimated Creatinine Clearance: 27.6 mL/min (A) (by C-G formula based on SCr of 1.01 mg/dL (H)).  Assessment: 44 yof with hx of CAD presented to ED with chest pain without ST elevation. Patient with elevated D-dimer. Patient was started on IV heparin for rule out PE, which CT confirmed it is negative.  Patient continues on IV heparin for Afib RVR.  Underwent cardiac cath showing non-obstructive CAD - given Afib, plan to start apixaban. Despite wt<60 kg, age<80 and Scr<1.5 so qualifies for full dose apixaban. Hgb 10.4, plt 175. No s/sx of bleeding.  Goal of Therapy:  Monitor platelets by anticoagulation protocol: Yes   Plan:  Start apixaban 5 mg BID tonight Monitor CBC, s/sx of bleeding  61, PharmD, BCCCP Clinical Pharmacist  Phone: [phone] 10/07/2020 12:33 PM  Please check AMION for all [facility] Pharmacy phone numbers After 10:00 PM, call Main Pharmacy [phone]

## 2020-10-07 NOTE — Progress Notes (Signed)
Progress Note  Patient Name: Laurie James Date of Encounter: 10/07/2020  [facility] Cardiologist: Charlton Haws, MD   Subjective    Denies any further CP.  Chest CTA with no PE.  Cath today showed 60% D2, 40% pRXA and 30% oRCA with spasm of the [facility] and low filling pressures.    Inpatient Medications    Scheduled Meds:  aspirin EC  81 mg Oral Daily   atorvastatin  40 mg Oral Daily   metoprolol tartrate  12.5 mg Oral BID   pantoprazole  40 mg Oral Daily   sodium chloride flush  3 mL Intravenous Q12H   sodium chloride flush  3 mL Intravenous Q12H   Continuous Infusions:  sodium chloride 75 mL/hr at 10/07/20 1009   sodium chloride     diltiazem (CARDIZEM) infusion 10 mg/hr (10/07/20 0655)   PRN Meds: sodium chloride, acetaminophen, albuterol, hydrALAZINE, labetalol, nitroGLYCERIN, ondansetron (ZOFRAN) IV, sodium chloride flush   Vital Signs    Vitals:   10/07/20 1011 10/07/20 1015 10/07/20 1030 10/07/20 1045  BP: 94/78 104/73 112/80 106/70  Pulse: 97 96 94 91  Resp:  (!) 25 (!) 25 (!) 22  Temp:      TempSrc:      SpO2:  93% 95% 96%  Weight:      Height:       No intake or output data in the 24 hours ending 10/07/20 1055  Last 3 Weights 10/05/2020 10/05/2020 09/25/2019  Weight (lbs) 96 lb 93 lb 1.6 oz 97 lb  Weight (kg) 43.545 kg 42.23 kg 43.999 kg      Telemetry    Atrial fibrillation with CVR- Personally Reviewed  ECG    Atrial fibrillation with nonspecific T wave abnormality Personally Reviewed  Physical Exam   GEN: Well nourished, well developed in no acute distress HEENT: Normal NECK: No JVD; No carotid bruits LYMPHATICS: No lymphadenopathy CARDIAC:irregularly irregular, no murmurs, rubs, gallops RESPIRATORY:  Clear to auscultation without rales, wheezing or rhonchi  ABDOMEN: Soft, non-tender, non-distended MUSCULOSKELETAL:  No edema; No deformity  SKIN: Warm and dry.  Right radial artery clean and dry with no hematoma NEUROLOGIC:   Alert and oriented x 3 PSYCHIATRIC:  Normal affect   Labs    High Sensitivity Troponin:   Recent Labs  Lab 10/05/20 1347 10/05/20 1713  TROPONINIHS 14 16       Chemistry Recent Labs  Lab 10/05/20 1347 10/05/20 1900 10/06/20 0044  NA 138  --  139  K 3.7  --  4.2  CL 105  --  108  CO2 23  --  24  GLUCOSE 98  --  135*  BUN 11  --  10  CREATININE 0.90  --  1.01*  CALCIUM 9.0  --  8.9  PROT  --  7.3  --   ALBUMIN  --  3.3*  --   AST  --  19  --   ALT  --  8  --   ALKPHOS  --  84  --   BILITOT  --  1.0  --   GFRNONAA >60  --  58*  ANIONGAP 10  --  7      Hematology Recent Labs  Lab 10/05/20 1347 10/06/20 0044 10/07/20 0010  WBC 11.3* 9.7 8.2  RBC 3.52* 3.48* 3.21*  HGB 11.5* 11.6* 10.4*  HCT 34.9* 34.5* 31.2*  MCV 99.1 99.1 97.2  MCH 32.7 33.3 32.4  MCHC 33.0 33.6 33.3  RDW 14.6  14.8 14.6  PLT 194 197 176     BNP Recent Labs  Lab 10/05/20 1902  BNP 733.2*      DDimer  Recent Labs  Lab 10/05/20 1900  DDIMER 2.08*      Radiology    DG Chest 1 View  Result Date: 10/05/2020 CLINICAL DATA:  Chest pain EXAM: CHEST  1 VIEW COMPARISON:  Chest CT 09/21/2020.  Chest x-ray 08/04/2013. FINDINGS: Lungs are hyperexpanded. Interstitial markings are diffusely coarsened with chronic features. The lungs are clear without focal pneumonia, edema, pneumothorax or pleural effusion. Bones are diffusely demineralized. IMPRESSION: Hyperexpansion with chronic interstitial coarsening. No acute cardiopulmonary findings. Electronically Signed   By: Kennith Center M.D.   On: 10/05/2020 15:02   CT Head Wo Contrast  Result Date: 10/05/2020 CLINICAL DATA:  Worsening headache. EXAM: CT HEAD WITHOUT CONTRAST TECHNIQUE: Contiguous axial images were obtained from the base of the skull through the vertex without intravenous contrast. COMPARISON:  July 12, 2020 FINDINGS: Brain: There is mild cerebral atrophy with widening of the extra-axial spaces and ventricular dilatation. There  are areas of decreased attenuation within the white matter tracts of the supratentorial brain, consistent with microvascular disease changes. Vascular: No hyperdense vessel or unexpected calcification. Skull: Normal. Negative for fracture or focal lesion. Sinuses/Orbits: No acute finding. Other: None. IMPRESSION: 1. Generalized cerebral atrophy. 2. No acute intracranial abnormality. Electronically Signed   By: Aram Candela M.D.   On: 10/05/2020 15:55   CT Angio Chest Pulmonary Embolism (PE) W or WO Contrast  Result Date: 10/05/2020 CLINICAL DATA:  Positive D-dimer EXAM: CT ANGIOGRAPHY CHEST WITH CONTRAST TECHNIQUE: Multidetector CT imaging of the chest was performed using the standard protocol during bolus administration of intravenous contrast. Multiplanar CT image reconstructions and MIPs were obtained to evaluate the vascular anatomy. CONTRAST:  50mL OMNIPAQUE IOHEXOL 350 MG/ML SOLN COMPARISON:  10/21/2020 FINDINGS: Cardiovascular: No filling defects in the pulmonary arteries to suggest pulmonary emboli. Coronary artery and aortic calcifications. Heart is borderline in size. No aneurysm. Mediastinum/Nodes: No mediastinal, hilar, or axillary adenopathy. Trachea and esophagus are unremarkable. Thyroid unremarkable. Lungs/Pleura: Moderate centrilobular emphysema. Extensive scarring in the apices bilaterally, stable since prior study. Scattered bilateral pulmonary nodules throughout both lungs, stable. No effusions. No acute confluent opacities. Upper Abdomen: Imaging into the upper abdomen demonstrates no acute findings. Musculoskeletal: Chest wall soft tissues are unremarkable. No acute bony abnormality. Review of the MIP images confirms the above findings. IMPRESSION: No evidence of pulmonary embolus. Scattered bilateral small pulmonary nodules are unchanged in size, number and appearance since prior study and dating back to 2018. Cardiomegaly, coronary artery disease. Aortic Atherosclerosis (ICD10-I70.0)  and Emphysema (ICD10-J43.9). Electronically Signed   By: Charlett Nose M.D.   On: 10/05/2020 21:38   CARDIAC CATHETERIZATION  Result Date: 10/07/2020 Formatting of this result is different from the original. Images from the original result were not included.  2nd Diag lesion is 60% stenosed.  Prox RCA lesion is 40% stenosed.  Ost RCA lesion is 30% stenosed.  Cannie Muckle Brittanie Dosanjh is a 75 y.o. female  [id number] LOCATION:  FACILITY: [facility] PHYSICIAN: [name], M.D. [birth year] DATE OF PROCEDURE:  10/07/2020 DATE OF DISCHARGE: CARDIAC CATHETERIZATION History obtained from chart review.75 y.o. female with hx of CAD on CT 2013 with ca+ score of 103, no ischemia on myoview 2016, EF 67% , smoker, macular degeneration, COPD, hiatal Herna who is being seen 10/05/2020 for the evaluation of chest pain.  She did go into atrial fibrillation.  LV function  was preserved although her RV was dilated.  Her enzymes were low.  She presents now for right left heart cath.   Ms. Laurie James has nonobstructive coronary artery disease.  She did have a spasm of the ostium of her large dominant RCA without damping.  The initial nonselective shot did not show a tight stenosis.  She did have 40% segmental mid RCA stenosis as well as 50 to 60% segmental second diagonal branch stenosis all of which can be treated medically.  Her systolic pressure was low as was her EDP and filling pressures.  She will need to be treated with a DOAC for her A. fib.  Medical therapy otherwise.  The radial sheath was removed and a TR band was placed on the right wrist to achieve patent hemostasis.  The patient left the lab in stable condition. Nanetta BattyJonathan Berry. MD, [facility] 10/07/2020 9:31 AM   ECHOCARDIOGRAM COMPLETE  Result Date: 10/06/2020    ECHOCARDIOGRAM REPORT   Patient Name:   Laurie James Date of Exam: 10/06/2020 Medical Rec #:  [id number]                   Height:       56.0 in Accession #:    [id number]                  Weight:        96.0 lb Date of Birth:  22-Jun-1945                   BSA:          1.299 m Patient Age:    75 years                    BP:           129/81 mmHg Patient Gender: F                           HR:           93 bpm. Exam Location:  Inpatient Procedure: 2D Echo, Color Doppler and Cardiac Doppler Indications:    Chest pain  History:        Patient has no prior history of Echocardiogram examinations.                 COPD; Risk Factors:Current Smoker.  Sonographer:    Shirlean KellyJohn Mendel Brown Referring Phys: [address] LA[address] R White  Sonographer Comments: Technically difficult study due to poor echo windows. IMPRESSIONS  1. Left ventricular ejection fraction, by estimation, is 55 to 60%. The left ventricle has normal function. The left ventricle has no regional wall motion abnormalities. Left ventricular diastolic parameters are consistent with Grade II diastolic dysfunction (pseudonormalization).  2. Right ventricular systolic function is mildly reduced. The right ventricular size is moderately enlarged. There is mildly elevated pulmonary artery systolic pressure. The estimated right ventricular systolic pressure is 44.2 mmHg.  3. Left atrial size was severely dilated.  4. Right atrial size was severely dilated.  5. The mitral valve is normal in structure. Mild mitral valve regurgitation. No evidence of mitral stenosis.  6. Tricuspid valve regurgitation is severe.  7. The aortic valve is grossly normal. There is mild calcification of the aortic valve. Aortic valve regurgitation is mild. No aortic stenosis is present.  8. Pulmonic valve regurgitation is moderate.  9. The inferior vena cava is dilated in size with <50% respiratory variability,  suggesting right atrial pressure of 15 mmHg. FINDINGS  Left Ventricle: Left ventricular ejection fraction, by estimation, is 55 to 60%. The left ventricle has normal function. The left ventricle has no regional wall motion abnormalities. The left ventricular internal cavity size was normal in size.  There is  no left ventricular hypertrophy. Left ventricular diastolic parameters are consistent with Grade II diastolic dysfunction (pseudonormalization). Right Ventricle: The right ventricular size is moderately enlarged. No increase in right ventricular wall thickness. Right ventricular systolic function is mildly reduced. There is mildly elevated pulmonary artery systolic pressure. The tricuspid regurgitant velocity is 2.70 m/s, and with an assumed right atrial pressure of 15 mmHg, the estimated right ventricular systolic pressure is 44.2 mmHg. Left Atrium: Left atrial size was severely dilated. Right Atrium: Right atrial size was severely dilated. Pericardium: There is no evidence of pericardial effusion. Mitral Valve: The mitral valve is normal in structure. Mild mitral valve regurgitation. No evidence of mitral valve stenosis. Tricuspid Valve: The tricuspid valve is grossly normal. Tricuspid valve regurgitation is severe. No evidence of tricuspid stenosis. Aortic Valve: The aortic valve is grossly normal. There is mild calcification of the aortic valve. Aortic valve regurgitation is mild. No aortic stenosis is present. Aortic valve mean gradient measures 2.0 mmHg. Aortic valve peak gradient measures 4.3 mmHg. Aortic valve area, by VTI measures 1.50 cm. Pulmonic Valve: The pulmonic valve was normal in structure. Pulmonic valve regurgitation is moderate. No evidence of pulmonic stenosis. Aorta: The aortic root is normal in size and structure. Venous: The inferior vena cava is dilated in size with less than 50% respiratory variability, suggesting right atrial pressure of 15 mmHg. The inferior vena cava and the hepatic vein show a pattern of systolic flow reversal, suggestive of tricuspid regurgitation. IAS/Shunts: No atrial level shunt detected by color flow Doppler.  LEFT VENTRICLE PLAX 2D LVIDd:         4.00 cm  Diastology LVIDs:         3.00 cm  LV e' medial:    6.31 cm/s LV PW:         1.00 cm  LV E/e'  medial:  9.5 LV IVS:        0.80 cm  LV e' lateral:   7.18 cm/s LVOT diam:     1.90 cm  LV E/e' lateral: 8.4 LV SV:         29 LV SV Index:   22 LVOT Area:     2.84 cm  RIGHT VENTRICLE             IVC RV Basal diam:  3.70 cm     IVC diam: 2.20 cm RV S prime:     11.70 cm/s LEFT ATRIUM             Index       RIGHT ATRIUM           Index LA diam:        3.30 cm 2.54 cm/m  RA Area:     25.50 cm LA Vol (A2C):   80.0 ml 61.56 ml/m RA Volume:   90.40 ml  69.57 ml/m LA Vol (A4C):   88.7 ml 68.26 ml/m LA Biplane Vol: 84.4 ml 64.95 ml/m  AORTIC VALVE AV Area (Vmax):    1.52 cm AV Area (Vmean):   1.34 cm AV Area (VTI):     1.50 cm AV Vmax:           104.00 cm/s AV Vmean:  69.900 cm/s AV VTI:            0.191 m AV Peak Grad:      4.3 mmHg AV Mean Grad:      2.0 mmHg LVOT Vmax:         55.60 cm/s LVOT Vmean:        33.000 cm/s LVOT VTI:          0.101 m LVOT/AV VTI ratio: 0.53  AORTA Ao Root diam: 2.60 cm Ao Asc diam:  3.00 cm MITRAL VALVE               TRICUSPID VALVE MV Area (PHT): 3.99 cm    TR Peak grad:   29.2 mmHg MV Decel Time: 190 msec    TR Vmax:        270.00 cm/s MV E velocity: 60.20 cm/s MV A velocity: 59.50 cm/s  SHUNTS MV E/A ratio:  1.01        Systemic VTI:  0.10 m                            Systemic Diam: 1.90 cm Weston Brass MD Electronically signed by Weston Brass MD Signature Date/Time: 10/06/2020/10:39:24 AM    Final     Cardiac Studies   2D echo 10/06/2020 IMPRESSIONS    1. Left ventricular ejection fraction, by estimation, is 55 to 60%. The  left ventricle has normal function. The left ventricle has no regional  wall motion abnormalities. Left ventricular diastolic parameters are  consistent with Grade II diastolic  dysfunction (pseudonormalization).   2. Right ventricular systolic function is mildly reduced. The right  ventricular size is moderately enlarged. There is mildly elevated  pulmonary artery systolic pressure. The estimated right ventricular  systolic  pressure is 44.2 mmHg.   3. Left atrial size was severely dilated.   4. Right atrial size was severely dilated.   5. The mitral valve is normal in structure. Mild mitral valve  regurgitation. No evidence of mitral stenosis.   6. Tricuspid valve regurgitation is severe.   7. The aortic valve is grossly normal. There is mild calcification of the  aortic valve. Aortic valve regurgitation is mild. No aortic stenosis is  present.   8. Pulmonic valve regurgitation is moderate.   9. The inferior vena cava is dilated in size with <50% respiratory  variability, suggesting right atrial pressure of 15 mmHg.   Patient Profile     75 y.o. female with hx of CAD on CT 2013 with ca+ score of 103, no ischemia on myoview 2016, EF 67% , smoker, macular degeneration, COPD, hiatal Herna who is being seen 10/05/2020 for the evaluation of chest pain.  Assessment & Plan    Chest pain -CP concerning given that it radiated into her neck and jaw as well as shoulder blades and was associated with nausea and diaphoresis -hsTrop normal -her CRF include ongoing tobacco use -coronary CTA in 2013 showed a coronary Ca score of 103 with moderate calcified plauqe in the prox to mid LAD < 50% and focal mixed plaque in the oRCA 50-70%.   -had Chest CT 2 weeks ago for SOB and had been referred to Pulmonary for PHTN and right sided heart enlargement -2D echo this admit confirms right sided enlargement with moderate RVE and mild RV dysfunction, severe BAE, severe TR, moderate PR and mild to moderate PHTN with PASP 1mmHg>>suspect related to COPD -Repeat Chest CTA this admit showed  no PE but did show CAD and emphysema -Heart cath showed mild non obstructive CAD with 60% D2, 40% pRXA and 30% oRCA with spasm of the [facility] and low filling pressures.   -medical management recommended -continue statin and BB -no ASA due to need for DOAC for afib   Pulmonary HTN/Right heart enlargement/Severe TR -this was noted on echo associated  with dilated right heart and severe TR -RHC today showed low filling pressures with RAP , RVP 16/78mmHg, PAP 17/2 with mean , PCWP V and mean , LVEDP with CO low at 2.5L/min likely related to volume depletion -will need followup for TR as outpt  ASCAD -coronary CTA in 2013 showed coronary Ca score of 103 with moderate calcified plauqe in the prox to mid LAD < 50% and focal mixed plaque in the oRCA 50-70%.   -now with chest pain worrisome for angina>>hsTro normal and LVF normal on echo -Heart cath this admit showed mild non obstructive CAD with 60% D2, 40% pRXA and 30% oRCA with spasm of the oRCA and low filling pressures.   -continue statin, BB -no ASA due to need for DOAC  HLD -LDL goal < 70 -LDL 74 this admission -atorvastatin increased to 80mg  daily -will need followup FLP and ALT in 6 weeks  New onset atrial fibrillation -she went into afib with RVR yesterday and remains in afib with CVR on tele on IV Cardizem gtt -transition to PO cardizem and start with 30mg  q6 hours given that her BP is soft -start Eliquis per pharmacy for [facility] score of 4  Hypotension -her last BP was 106/80mmHg on cardizem gtt at 10mg /hr -related to volume depletion -her filling pressures are very low -her daughter says that she does not drink any water at home -currently on IVF at 75cc/hr - will give a bolus of 250cc NS  now and then continue on 75cc/hr  I have spent a total of 35 minutes with patient reviewing 2D echo, coronary CTA from 2013, Chest CTA this admit , telemetry, EKGs, labs and examining patient as well as establishing an assessment and plan that was discussed with the patient.  > 50% of time was spent in direct patient care.     For questions or updates, please contact [facility] Please consult [email or web address] for contact info under        Signed, Armanda Magic, MD  10/07/2020, 10:55 AM

## 2020-10-08 LAB — CBC
HCT: 30.5 % — ABNORMAL LOW (ref 36.0–46.0)
Hemoglobin: 10.4 g/dL — ABNORMAL LOW (ref 12.0–15.0)
MCH: 32.7 pg (ref 26.0–34.0)
MCHC: 34.1 g/dL (ref 30.0–36.0)
MCV: 95.9 fL (ref 80.0–100.0)
Platelets: 194 10*3/uL (ref 150–400)
RBC: 3.18 MIL/uL — ABNORMAL LOW (ref 3.87–5.11)
RDW: 14.1 % (ref 11.5–15.5)
WBC: 7.7 10*3/uL (ref 4.0–10.5)
nRBC: 0 % (ref 0.0–0.2)

## 2020-10-08 LAB — BASIC METABOLIC PANEL
Anion gap: 10 (ref 5–15)
BUN: 14 mg/dL (ref 8–23)
CO2: 21 mmol/L — ABNORMAL LOW (ref 22–32)
Calcium: 8.3 mg/dL — ABNORMAL LOW (ref 8.9–10.3)
Chloride: 103 mmol/L (ref 98–111)
Creatinine, Ser: 0.85 mg/dL (ref 0.44–1.00)
GFR, Estimated: >60 mL/min (ref >60)
Glucose, Bld: 104 mg/dL — ABNORMAL HIGH (ref 70–99)
Potassium: 3.7 mmol/L (ref 3.5–5.1)
Sodium: 134 mmol/L — ABNORMAL LOW (ref 135–145)

## 2020-10-08 MED ORDER — METOPROLOL TARTRATE 5 MG/5ML IV SOLN
2.5000 mg | INTRAVENOUS | Status: AC
  Administered 2020-10-08: 2.5 mg via INTRAVENOUS
  Filled 2020-10-08: qty 5

## 2020-10-08 MED ORDER — DILTIAZEM LOAD VIA INFUSION
10.0000 mg | Freq: Once | INTRAVENOUS | Status: AC
  Administered 2020-10-08: 10 mg via INTRAVENOUS
  Filled 2020-10-08: qty 10

## 2020-10-08 MED ORDER — DILTIAZEM HCL-DEXTROSE 125-5 MG/125ML-% IV SOLN (PREMIX)
5.0000 mg/h | INTRAVENOUS | Status: DC
  Administered 2020-10-08: 7.5 mg/h via INTRAVENOUS
  Administered 2020-10-08 – 2020-10-09 (×2): 5 mg/h via INTRAVENOUS
  Filled 2020-10-08 (×4): qty 125

## 2020-10-08 NOTE — Plan of Care (Signed)

## 2020-10-08 NOTE — Progress Notes (Signed)
Progress Note  Patient Name: Laurie James [name] Date of Encounter: 10/08/2020  [facility] Cardiologist: Charlton Haws, MD   Subjective   Had an episode of afib with RVR this am in the 130's and now HR in the 70's after a 10mg  bolus of IV Cardizem and starting on gtt at 5mg /hr.  Denies any chest pain or SOB and anxious to go home.  Cath showed 60% D2, 40% pRXA and 30% oRCA with spasm of the [facility] and low filling pressures.    Inpatient Medications    Scheduled Meds:  apixaban  5 mg Oral BID   atorvastatin  40 mg Oral Daily   metoprolol tartrate  12.5 mg Oral BID   pantoprazole  40 mg Oral Daily   sodium chloride flush  3 mL Intravenous Q12H   sodium chloride flush  3 mL Intravenous Q12H   Continuous Infusions:  sodium chloride     diltiazem (CARDIZEM) infusion 5 mg/hr (10/08/20 1001)   PRN Meds: sodium chloride, acetaminophen, albuterol, nitroGLYCERIN, ondansetron (ZOFRAN) IV, sodium chloride flush   Vital Signs    Vitals:   10/08/20 0350 10/08/20 0738 10/08/20 0800 10/08/20 1113  BP: 107/81 102/78  106/67  Pulse: (!) 104 (!) 140 (!) 133 80  Resp: (!) 24 (!) 30  (!) 24  Temp: 97.6 F (36.4 C) (!) 97.4 F (36.3 C)  (!) 97.5 F (36.4 C)  TempSrc: Oral Oral  Oral  SpO2: 95% (!) 87%    Weight:      Height:        Intake/Output Summary (Last 24 hours) at 10/08/2020 1144 Last data filed at 10/08/2020 0956 Gross per 24 hour  Intake 594.13 ml  Output 0 ml  Net 594.13 ml    Last 3 Weights 10/05/2020 10/05/2020 09/25/2019  Weight (lbs) 96 lb 93 lb 1.6 oz 97 lb  Weight (kg) 43.545 kg 42.23 kg 43.999 kg      Telemetry    Atrial fibrillation with CVR- Personally Reviewed  ECG    No new EKG to review - I Personally Reviewed  Physical Exam   GEN: Well nourished, well developed in no acute distress HEENT: Normal NECK: No JVD; No carotid bruits LYMPHATICS: No lymphadenopathy CARDIAC:irregularly irregular, no murmurs, rubs, gallops RESPIRATORY:  Clear to  auscultation without rales, wheezing or rhonchi  ABDOMEN: Soft, non-tender, non-distended MUSCULOSKELETAL:  No edema; No deformity  SKIN: Warm and dry NEUROLOGIC:  Alert and oriented x 3 PSYCHIATRIC:  Normal affect    Labs    High Sensitivity Troponin:   Recent Labs  Lab 10/05/20 1347 10/05/20 1713  TROPONINIHS 14 16       Chemistry Recent Labs  Lab 10/05/20 1347 10/05/20 1900 10/06/20 0044 10/07/20 0857 10/07/20 0901 10/07/20 0904  NA 138  --  139 137 137 138  K 3.7  --  4.2 3.5 3.5 3.5  CL 105  --  108  --   --   --   CO2 23  --  24  --   --   --   GLUCOSE 98  --  135*  --   --   --   BUN 11  --  10  --   --   --   CREATININE 0.90  --  1.01*  --   --   --   CALCIUM 9.0  --  8.9  --   --   --   PROT  --  7.3  --   --   --   --  ALBUMIN  --  3.3*  --   --   --   --   AST  --  19  --   --   --   --   ALT  --  8  --   --   --   --   ALKPHOS  --  84  --   --   --   --   BILITOT  --  1.0  --   --   --   --   GFRNONAA >60  --  58*  --   --   --   ANIONGAP 10  --  7  --   --   --       Hematology Recent Labs  Lab 10/06/20 0044 10/07/20 0010 10/07/20 0857 10/07/20 0901 10/07/20 0904 10/08/20 0044  WBC 9.7 8.2  --   --   --  7.7  RBC 3.48* 3.21*  --   --   --  3.18*  HGB 11.6* 10.4*   < > 9.9* 12.2 10.4*  HCT 34.5* 31.2*   < > 29.0* 36.0 30.5*  MCV 99.1 97.2  --   --   --  95.9  MCH 33.3 32.4  --   --   --  32.7  MCHC 33.6 33.3  --   --   --  34.1  RDW 14.8 14.6  --   --   --  14.1  PLT 197 176  --   --   --  194   < > = values in this interval not displayed.     BNP Recent Labs  Lab 10/05/20 1902  BNP 733.2*      DDimer  Recent Labs  Lab 10/05/20 1900  DDIMER 2.08*      Radiology    CARDIAC CATHETERIZATION  Result Date: 10/07/2020 Formatting of this result is different from the original. Images from the original result were not included.  2nd Diag lesion is 60% stenosed.  Prox RCA lesion is 40% stenosed.  Ost RCA lesion is 30%  stenosed.  Laurie James is a 75 y.o. female  [id number] LOCATION:  FACILITY: [facility] PHYSICIAN: [name], M.D. [birth year] DATE OF PROCEDURE:  10/07/2020 DATE OF DISCHARGE: CARDIAC CATHETERIZATION History obtained from chart review.75 y.o. female with hx of CAD on CT 2013 with ca+ score of 103, no ischemia on myoview 2016, EF 67% , smoker, macular degeneration, COPD, hiatal Herna who is being seen 10/05/2020 for the evaluation of chest pain.  She did go into atrial fibrillation.  LV function was preserved although her RV was dilated.  Her enzymes were low.  She presents now for right left heart cath.   Ms. Vahey has nonobstructive coronary artery disease.  She did have a spasm of the ostium of her large dominant RCA without damping.  The initial nonselective shot did not show a tight stenosis.  She did have 40% segmental mid RCA stenosis as well as 50 to 60% segmental second diagonal branch stenosis all of which can be treated medically.  Her systolic pressure was low as was her EDP and filling pressures.  She will need to be treated with a DOAC for her A. fib.  Medical therapy otherwise.  The radial sheath was removed and a TR band was placed on the right wrist to achieve patent hemostasis.  The patient left the lab in stable condition. [name]. MD, [facility] 10/07/2020 9:31 AM    Cardiac Studies  2D echo 10/06/2020 IMPRESSIONS    1. Left ventricular ejection fraction, by estimation, is 55 to 60%. The  left ventricle has normal function. The left ventricle has no regional  wall motion abnormalities. Left ventricular diastolic parameters are  consistent with Grade II diastolic  dysfunction (pseudonormalization).   2. Right ventricular systolic function is mildly reduced. The right  ventricular size is moderately enlarged. There is mildly elevated  pulmonary artery systolic pressure. The estimated right ventricular  systolic pressure is 44.2 mmHg.   3. Left atrial size was severely  dilated.   4. Right atrial size was severely dilated.   5. The mitral valve is normal in structure. Mild mitral valve  regurgitation. No evidence of mitral stenosis.   6. Tricuspid valve regurgitation is severe.   7. The aortic valve is grossly normal. There is mild calcification of the  aortic valve. Aortic valve regurgitation is mild. No aortic stenosis is  present.   8. Pulmonic valve regurgitation is moderate.   9. The inferior vena cava is dilated in size with <50% respiratory  variability, suggesting right atrial pressure of 15 mmHg.   Cardiac Cath 09/27/2020 IMPRESSION: Ms. Seaborn has nonobstructive coronary artery disease.  She did have a spasm of the ostium of her large dominant RCA without damping.  The initial nonselective shot did not show a tight stenosis.  She did have 40% segmental mid RCA stenosis as well as 50 to 60% segmental second diagonal branch stenosis all of which can be treated medically.  Her systolic pressure was low as was her EDP and filling pressures.  She will need to be treated with a DOAC for her A. fib.  Medical therapy otherwise.  The radial sheath was removed and a TR band was placed on the right wrist to achieve patent hemostasis.  The patient left the lab in stable condition.  Patient Profile     75 y.o. female with hx of CAD on CT 2013 with ca+ score of 103, no ischemia on myoview 2016, EF 67% , smoker, macular degeneration, COPD, hiatal Herna who is being seen 10/05/2020 for the evaluation of chest pain.  Assessment & Plan    Chest pain -CP concerning given that it radiated into her neck and jaw as well as shoulder blades and was associated with nausea and diaphoresis -hsTrop normal -her CRF include ongoing tobacco use -coronary CTA in 2013 showed a coronary Ca score of 103 with moderate calcified plauqe in the prox to mid LAD < 50% and focal mixed plaque in the oRCA 50-70%.   -had Chest CT 2 weeks ago for SOB and had been referred to Pulmonary for PHTN  and right sided heart enlargement -2D echo this admit confirms right sided enlargement with moderate RVE and mild RV dysfunction, severe BAE, severe TR, moderate PR and mild to moderate PHTN with PASP 29mmHg>>suspect related to COPD -Repeat Chest CTA this admit showed no PE but did show CAD and emphysema -Heart cath showed mild to moderate non obstructive CAD with 60% D2, 40% pRXA and 30% oRCA with spasm of the [facility] and low filling pressures.   -medical management recommended -continue statin and BB -no ASA due to need for DOAC for afib  -she has not had any further CP or SOB  Pulmonary HTN/Right heart enlargement/Severe TR -this was noted on echo associated with dilated right heart and severe TR -RHC today showed low filling pressures with RAP , RVP 16/72mmHg, PAP 17/2 with mean , PCWP V  and mean , LVEDP with CO low at 2.5L/min likely related to volume depletion -will need followup for TR as outpt  ASCAD -coronary CTA in 2013 showed coronary Ca score of 103 with moderate calcified plauqe in the prox to mid LAD < 50% and focal mixed plaque in the oRCA 50-70%.   -now with chest pain worrisome for angina>>hsTro normal and LVF normal on echo -Heart cath this admit showed mild to moderate non obstructive CAD with 60% D2, 40% pRXA and 30% oRCA with spasm of the [facility] and low filling pressures.   -continue statin, BB -no ASA due to need for DOAC  HLD -LDL goal < 70 -LDL 74 this admission -atorvastatin increased to 80mg  daily -will need followup FLP and ALT in 6 weeks  New onset atrial fibrillation -she went into afib with RVR this admission and has remained in afib with CVR on tele on IV Cardizem gtt -transitioned to PO cardizem yesterday -started Eliquis per pharmacy for [facility] score of 4 after heart cath -this am remained in afib but went into RVR while being cleaned up and was given 10mg  IV bolus of Cardizem and started back on gtt at 5mg /hr and now HR In the  70s' -HRs seem to be controlled at rest but with any movement they increase significantly -BP has been soft and may be difficult to titrate rate controlling meds -with underlying emphysema, would like to avoid using Amio for rate control but may not have any better options.  -may need to consider TEE/DCCV if we cannot get her rate controlled (she went into afib on 6/28 and then was in and out of it until 6/29 and has been in it since then so > 48 hours now of afib >>was on IV Heparin on 6/28 but then held for cath yesterday and then started on Eliquis 5mg  BID yesterday evening post cath so has been in afib) so may need TEE with her cardioversion  Hypotension -related to volume depletion -her filling pressures were very low at cath -her daughter says that she does not drink any water at home -hydrated yesterday with IVF and BP today 106/38mmHg on IV Cardizem gtt  I have spent a total of 35 minutes with patient reviewing 2D echo, coronary CTA from 2013, Chest CTA this admit , telemetry, EKGs, labs and examining patient as well as establishing an assessment and plan that was discussed with the patient.  > 50% of time was spent in direct patient care.     For questions or updates, please contact [facility] Please consult [email or web address] for contact info under        Signed, Armanda Magic, MD  10/08/2020, 11:44 AM

## 2020-10-08 NOTE — Plan of Care (Signed)
  Problem: Education: Goal: Knowledge of General Education information will improve Description: Including pain rating scale, medication(s)/side effects and non-pharmacologic comfort measures Outcome: Progressing   Problem: Health Behavior/Discharge Planning: Goal: Ability to manage health-related needs will improve Outcome: Progressing   Problem: Clinical Measurements: Goal: Ability to maintain clinical measurements within normal limits will improve Outcome: Progressing Goal: Will remain free from infection Outcome: Progressing Goal: Respiratory complications will improve Outcome: Progressing   Problem: Activity: Goal: Risk for activity intolerance will decrease Outcome: Progressing   Problem: Nutrition: Goal: Adequate nutrition will be maintained Outcome: Progressing   Problem: Coping: Goal: Level of anxiety will decrease Outcome: Progressing   Problem: Elimination: Goal: Will not experience complications related to bowel motility Outcome: Progressing   

## 2020-10-08 NOTE — TOC Benefit Eligibility Note (Signed)
Transition of Care [facility]) Benefit Eligibility Note    Patient Details  Name: Laurie James MRN: [id number] Date of Birth: Jun 20, 1945   Medication/Dose: Arne Cleveland  5 MG BID  Covered?: Yes  Tier: 3 Drug  Prescription Coverage [facility]erred [facility]acy: [facility]  and  [facility]  Spoke with Person/Company/Phone Number:: ZEKI  @  [employer]  OX # [phone]  Co-Pay: $ 1244.00  Prior Approval: No  Deductible: Met (OUT-OF-POCKET :UNMET)  Additional Notes: APIXABAN : NON-FORMULARY    Memory Argue Phone Number: 10/08/2020, 2:16 PM

## 2020-10-09 DIAGNOSIS — I4819 Other persistent atrial fibrillation: Secondary | ICD-10-CM

## 2020-10-09 LAB — CBC
HCT: 31.7 % — ABNORMAL LOW (ref 36.0–46.0)
Hemoglobin: 10.7 g/dL — ABNORMAL LOW (ref 12.0–15.0)
MCH: 32.4 pg (ref 26.0–34.0)
MCHC: 33.8 g/dL (ref 30.0–36.0)
MCV: 96.1 fL (ref 80.0–100.0)
Platelets: 232 10*3/uL (ref 150–400)
RBC: 3.3 MIL/uL — ABNORMAL LOW (ref 3.87–5.11)
RDW: 13.9 % (ref 11.5–15.5)
WBC: 7.8 10*3/uL (ref 4.0–10.5)
nRBC: 0 % (ref 0.0–0.2)

## 2020-10-09 LAB — BASIC METABOLIC PANEL
Anion gap: 10 (ref 5–15)
BUN: 13 mg/dL (ref 8–23)
CO2: 20 mmol/L — ABNORMAL LOW (ref 22–32)
Calcium: 8.4 mg/dL — ABNORMAL LOW (ref 8.9–10.3)
Chloride: 101 mmol/L (ref 98–111)
Creatinine, Ser: 0.83 mg/dL (ref 0.44–1.00)
GFR, Estimated: >60 mL/min (ref >60)
Glucose, Bld: 109 mg/dL — ABNORMAL HIGH (ref 70–99)
Potassium: 3.7 mmol/L (ref 3.5–5.1)
Sodium: 131 mmol/L — ABNORMAL LOW (ref 135–145)

## 2020-10-09 MED ORDER — DIGOXIN 125 MCG PO TABS
0.1250 mg | ORAL_TABLET | Freq: Every day | ORAL | Status: DC
  Administered 2020-10-10 – 2020-10-11 (×2): 0.125 mg via ORAL
  Filled 2020-10-09 (×2): qty 1

## 2020-10-09 MED ORDER — DIGOXIN 0.25 MG/ML IJ SOLN
0.2500 mg | Freq: Once | INTRAMUSCULAR | Status: AC
  Administered 2020-10-09: 0.25 mg via INTRAVENOUS
  Filled 2020-10-09: qty 1

## 2020-10-09 MED ORDER — POLYETHYLENE GLYCOL 3350 17 G PO PACK
17.0000 g | PACK | Freq: Every day | ORAL | Status: DC | PRN
  Administered 2020-10-09: 17 g via ORAL
  Filled 2020-10-09: qty 1

## 2020-10-09 NOTE — Plan of Care (Signed)

## 2020-10-09 NOTE — Progress Notes (Signed)
Progress Note  Patient Name: Laurie James [name] Date of Encounter: 10/09/2020  [facility] Cardiologist: Charlton Haws, MD  Subjective   No CP or dyspnea  Inpatient Medications    Scheduled Meds:  apixaban  5 mg Oral BID   atorvastatin  40 mg Oral Daily   metoprolol tartrate  12.5 mg Oral BID   pantoprazole  40 mg Oral Daily   sodium chloride flush  3 mL Intravenous Q12H   sodium chloride flush  3 mL Intravenous Q12H   Continuous Infusions:  sodium chloride     diltiazem (CARDIZEM) infusion 7.5 mg/hr (10/08/20 2349)   PRN Meds: sodium chloride, acetaminophen, albuterol, nitroGLYCERIN, ondansetron (ZOFRAN) IV, sodium chloride flush   Vital Signs    Vitals:   10/08/20 2130 10/08/20 2200 10/08/20 2300 10/08/20 2317  BP: 106/83 117/73 102/70 102/70  Pulse: 90 87 77 78  Resp: (!) 23 (!) 21 20 15   Temp:    98 F (36.7 C)  TempSrc:    Oral  SpO2: 95% 94% 94% 93%  Weight:      Height:        Intake/Output Summary (Last 24 hours) at 10/09/2020 0713 Last data filed at 10/08/2020 0956 Gross per 24 hour  Intake 3 ml  Output --  Net 3 ml   Last 3 Weights 10/05/2020 10/05/2020 09/25/2019  Weight (lbs) 96 lb 93 lb 1.6 oz 97 lb  Weight (kg) 43.545 kg 42.23 kg 43.999 kg      Telemetry    Atrial fibrillation- Personally Reviewed   Physical Exam   GEN: No acute distress.   Neck: No JVD Cardiac: irregular Respiratory: diminished BS throughout GI: Soft, nontender, non-distended  MS: No edema Neuro:  Nonfocal  Psych: Normal affect   Labs    High Sensitivity Troponin:   Recent Labs  Lab 10/05/20 1347 10/05/20 1713  TROPONINIHS 14 16      Chemistry Recent Labs  Lab 10/05/20 1900 10/06/20 0044 10/07/20 0857 10/07/20 0904 10/08/20 0044 10/09/20 0031  NA  --  139   < > 138 134* 131*  K  --  4.2   < > 3.5 3.7 3.7  CL  --  108  --   --  103 101  CO2  --  24  --   --  21* 20*  GLUCOSE  --  135*  --   --  104* 109*  BUN  --  10  --   --  14 13   CREATININE  --  1.01*  --   --  0.85 0.83  CALCIUM  --  8.9  --   --  8.3* 8.4*  PROT 7.3  --   --   --   --   --   ALBUMIN 3.3*  --   --   --   --   --   AST 19  --   --   --   --   --   ALT 8  --   --   --   --   --   ALKPHOS 84  --   --   --   --   --   BILITOT 1.0  --   --   --   --   --   GFRNONAA  --  58*  --   --  >60 >60  ANIONGAP  --  7  --   --  10 10   < > = values  in this interval not displayed.     Hematology Recent Labs  Lab 10/07/20 0010 10/07/20 0857 10/07/20 0904 10/08/20 0044 10/09/20 0031  WBC 8.2  --   --  7.7 7.8  RBC 3.21*  --   --  3.18* 3.30*  HGB 10.4*   < > 12.2 10.4* 10.7*  HCT 31.2*   < > 36.0 30.5* 31.7*  MCV 97.2  --   --  95.9 96.1  MCH 32.4  --   --  32.7 32.4  MCHC 33.3  --   --  34.1 33.8  RDW 14.6  --   --  14.1 13.9  PLT 176  --   --  194 232   < > = values in this interval not displayed.    BNP Recent Labs  Lab 10/05/20 1902  BNP 733.2*     DDimer  Recent Labs  Lab 10/05/20 1900  DDIMER 2.08*     Radiology    CARDIAC CATHETERIZATION  Result Date: 10/07/2020 Formatting of this result is different from the original. Images from the original result were not included.  2nd Diag lesion is 60% stenosed.  Prox RCA lesion is 40% stenosed.  Ost RCA lesion is 30% stenosed.  Laurie James Laurie James is a 74 y.o. female  [id number] LOCATION:  FACILITY: [facility] PHYSICIAN: [name], M.D. February 04, 1946 DATE OF PROCEDURE:  10/07/2020 DATE OF DISCHARGE: CARDIAC CATHETERIZATION History obtained from chart review.75 y.o. female with hx of CAD on CT 2013 with ca+ score of 103, no ischemia on myoview 2016, EF 67% , smoker, macular degeneration, COPD, hiatal Herna who is being seen 10/05/2020 for the evaluation of chest pain.  She did go into atrial fibrillation.  LV function was preserved although her RV was dilated.  Her enzymes were low.  She presents now for right left heart cath.   Ms. Karle has nonobstructive coronary artery disease.  She  did have a spasm of the ostium of her large dominant RCA without damping.  The initial nonselective shot did not show a tight stenosis.  She did have 40% segmental mid RCA stenosis as well as 50 to 60% segmental second diagonal branch stenosis all of which can be treated medically.  Her systolic pressure was low as was her EDP and filling pressures.  She will need to be treated with a DOAC for her A. fib.  Medical therapy otherwise.  The radial sheath was removed and a TR band was placed on the right wrist to achieve patent hemostasis.  The patient left the lab in stable condition. [name]. MD, [facility] 10/07/2020 9:31 AM     Patient Profile     75 y.o. female with past medical history of coronary artery disease, COPD, tobacco abuse admitted with chest pain.  Echocardiogram showed normal LV function, grade 2 diastolic dysfunction, moderate right ventricular enlargement with mild RV dysfunction, severe biatrial enlargement, mild mitral regurgitation, severe tricuspid regurgitation, mild aortic insufficiency.  Cardiac catheterization showed spasm of the ostium of her right coronary artery, 40% mid right coronary artery, 50 to 60% second diagonal and medical therapy recommended.  Patient developed atrial fibrillation during hospitalization as well.  Note patient had COPD on chest CT.  Assessment & Plan    1 persistent atrial fibrillation-patient's heart rate remains elevated and blood pressure is borderline.  We will continue IV Cardizem and low-dose metoprolol.  Would like to avoid amiodarone in the setting of severe COPD.  We will add digoxin.  Note she  is asymptomatic and has severe biatrial enlargement as well as severe tricuspid regurgitation.  May be difficult to maintain sinus rhythm.  Best option may be rate control and anticoagulation if heart rate can be controlled.  Continue apixaban 5 mg twice daily.  We will plan to discharge once heart rate has improved.  2 coronary artery  disease-nonobstructive on recent catheterization.  Continue statin.  No aspirin given need for apixaban.  3 severe tricuspid regurgitation-we will need follow-up as an outpatient.  4 pulmonary hypertension/right/sided heart failure/tricuspid regurgitation-likely secondary to COPD.  5 hyperlipidemia-continue statin.  For questions or updates, please contact [facility] Please consult [email or web address] for contact info under        Signed, [name], MD  10/09/2020, 7:13 AM

## 2020-10-10 LAB — BASIC METABOLIC PANEL
Anion gap: 11 (ref 5–15)
BUN: 9 mg/dL (ref 8–23)
CO2: 22 mmol/L (ref 22–32)
Calcium: 8.5 mg/dL — ABNORMAL LOW (ref 8.9–10.3)
Chloride: 101 mmol/L (ref 98–111)
Creatinine, Ser: 0.77 mg/dL (ref 0.44–1.00)
GFR, Estimated: >60 mL/min (ref >60)
Glucose, Bld: 95 mg/dL (ref 70–99)
Potassium: 3.5 mmol/L (ref 3.5–5.1)
Sodium: 134 mmol/L — ABNORMAL LOW (ref 135–145)

## 2020-10-10 LAB — CBC
HCT: 32.4 % — ABNORMAL LOW (ref 36.0–46.0)
Hemoglobin: 11.1 g/dL — ABNORMAL LOW (ref 12.0–15.0)
MCH: 32.6 pg (ref 26.0–34.0)
MCHC: 34.3 g/dL (ref 30.0–36.0)
MCV: 95 fL (ref 80.0–100.0)
Platelets: 260 10*3/uL (ref 150–400)
RBC: 3.41 MIL/uL — ABNORMAL LOW (ref 3.87–5.11)
RDW: 14 % (ref 11.5–15.5)
WBC: 7.9 10*3/uL (ref 4.0–10.5)
nRBC: 0 % (ref 0.0–0.2)

## 2020-10-10 MED ORDER — DILTIAZEM HCL ER COATED BEADS 180 MG PO CP24
180.0000 mg | ORAL_CAPSULE | Freq: Every day | ORAL | Status: DC
  Administered 2020-10-10 – 2020-10-11 (×2): 180 mg via ORAL
  Filled 2020-10-10 (×2): qty 1

## 2020-10-10 NOTE — Progress Notes (Addendum)
Progress Note  Patient Name: Laurie James Date of Encounter: 10/10/2020  [facility] Cardiologist: Charlton Haws, MD  Subjective   Pt denies CP or dyspnea  Inpatient Medications    Scheduled Meds:  apixaban  5 mg Oral BID   atorvastatin  40 mg Oral Daily   digoxin  0.125 mg Oral Daily   metoprolol tartrate  12.5 mg Oral BID   pantoprazole  40 mg Oral Daily   sodium chloride flush  3 mL Intravenous Q12H   sodium chloride flush  3 mL Intravenous Q12H   Continuous Infusions:  sodium chloride     diltiazem (CARDIZEM) infusion 5 mg/hr (10/10/20 0100)   PRN Meds: sodium chloride, acetaminophen, albuterol, nitroGLYCERIN, ondansetron (ZOFRAN) IV, polyethylene glycol, sodium chloride flush   Vital Signs    Vitals:   10/09/20 1932 10/09/20 2037 10/09/20 2303 10/10/20 0354  BP: 125/74  120/77 111/78  Pulse: 83 (!) 104 98   Resp: 20  16 20   Temp: 98.3 F (36.8 C)  (!) 97.4 F (36.3 C) 97.6 F (36.4 C)  TempSrc: Oral  Oral Oral  SpO2: 95%     Weight:      Height:        Intake/Output Summary (Last 24 hours) at 10/10/2020 12/11/2020 Last data filed at 10/10/2020 0100 Gross per 24 hour  Intake 958.86 ml  Output 900 ml  Net 58.86 ml    Last 3 Weights 10/05/2020 10/05/2020 09/25/2019  Weight (lbs) 96 lb 93 lb 1.6 oz 97 lb  Weight (kg) 43.545 kg 42.23 kg 43.999 kg      Telemetry    Atrial fibrillation, rate mildly elevated at times- Personally Reviewed   Physical Exam   GEN: No acute distress.  Chronically ill appearing Neck: No JVD, supple Cardiac: irregular, tachycardic Respiratory: diminished BS throughout; no wheeze GI: Soft, NT/ND MS: No edema Neuro:  Grossly intact Psych: Normal affect   Labs    High Sensitivity Troponin:   Recent Labs  Lab 10/05/20 1347 10/05/20 1713  TROPONINIHS 14 16       Chemistry Recent Labs  Lab 10/05/20 1900 10/06/20 0044 10/08/20 0044 10/09/20 0031 10/10/20 0224  NA  --    < > 134* 131* 134*  K  --    <  > 3.7 3.7 3.5  CL  --    < > 103 101 101  CO2  --    < > 21* 20* 22  GLUCOSE  --    < > 104* 109* 95  BUN  --    < > 14 13 9   CREATININE  --    < > 0.85 0.83 0.77  CALCIUM  --    < > 8.3* 8.4* 8.5*  PROT 7.3  --   --   --   --   ALBUMIN 3.3*  --   --   --   --   AST 19  --   --   --   --   ALT 8  --   --   --   --   ALKPHOS 84  --   --   --   --   BILITOT 1.0  --   --   --   --   GFRNONAA  --    < > >60 >60 >60  ANIONGAP  --    < > 10 10 11    < > = values in this interval not displayed.  Hematology Recent Labs  Lab 10/08/20 0044 10/09/20 0031 10/10/20 0224  WBC 7.7 7.8 7.9  RBC 3.18* 3.30* 3.41*  HGB 10.4* 10.7* 11.1*  HCT 30.5* 31.7* 32.4*  MCV 95.9 96.1 95.0  MCH 32.7 32.4 32.6  MCHC 34.1 33.8 34.3  RDW 14.1 13.9 14.0  PLT 194 232 260     BNP Recent Labs  Lab 10/05/20 1902  BNP 733.2*      DDimer  Recent Labs  Lab 10/05/20 1900  DDIMER 2.08*      Patient Profile     75 y.o. female with past medical history of coronary artery disease, COPD, tobacco abuse admitted with chest pain.  Echocardiogram showed normal LV function, grade 2 diastolic dysfunction, moderate right ventricular enlargement with mild RV dysfunction, severe biatrial enlargement, mild mitral regurgitation, severe tricuspid regurgitation, mild aortic insufficiency.  Cardiac catheterization showed spasm of the ostium of her right coronary artery, 40% mid right coronary artery, 50 to 60% second diagonal and medical therapy recommended.  Patient developed atrial fibrillation during hospitalization as well.  Note patient had COPD on chest CT.  Assessment & Plan    1 persistent atrial fibrillation-patient remains in atrial fibrillation this morning and heart rate is mildly elevated at times but overall reasonably well controlled.  She would like to be discharged.  I will discontinue IV Cardizem and treat with Cardizem CD 180 mg daily.  Continue digoxin and metoprolol.  If heart rate controlled  and blood pressure stable this afternoon will discharge home and patient can undergo elective cardioversion in 4 weeks.  If she does not hold sinus rhythm then would likely plan rate control and anticoagulation as she is asymptomatic.  As outlined previously it may be difficult to maintain sinus rhythm given biatrial enlargement, severe tricuspid regurgitation and COPD.  I would like to avoid amiodarone given underlying lung disease.  Continue apixaban at present dose.    2 coronary artery disease-nonobstructive on recent catheterization.  Continue statin.  No aspirin given need for apixaban.  3 severe tricuspid regurgitation-we will need follow-up as an outpatient.  4 pulmonary hypertension/right/sided heart failure/tricuspid regurgitation-likely secondary to COPD.  5 hyperlipidemia-continue statin.  If patient is discharged would arrange follow-up with APP in 2 weeks and Dr. Ellyn Hack in 12 weeks.  For questions or updates, please contact [facility] Please consult [email or web address] for contact info under        Signed, Laurie Millers, MD  10/10/2020, 7:12 AM

## 2020-10-10 NOTE — Plan of Care (Signed)

## 2020-10-11 DIAGNOSIS — I272 Pulmonary hypertension, unspecified: Secondary | ICD-10-CM

## 2020-10-11 DIAGNOSIS — I4891 Unspecified atrial fibrillation: Secondary | ICD-10-CM

## 2020-10-11 DIAGNOSIS — E785 Hyperlipidemia, unspecified: Secondary | ICD-10-CM

## 2020-10-11 DIAGNOSIS — I071 Rheumatic tricuspid insufficiency: Secondary | ICD-10-CM

## 2020-10-11 DIAGNOSIS — I959 Hypotension, unspecified: Secondary | ICD-10-CM

## 2020-10-11 DIAGNOSIS — E876 Hypokalemia: Secondary | ICD-10-CM

## 2020-10-11 DIAGNOSIS — J432 Centrilobular emphysema: Secondary | ICD-10-CM

## 2020-10-11 LAB — BASIC METABOLIC PANEL
Anion gap: 7 (ref 5–15)
BUN: 9 mg/dL (ref 8–23)
CO2: 27 mmol/L (ref 22–32)
Calcium: 8.5 mg/dL — ABNORMAL LOW (ref 8.9–10.3)
Chloride: 101 mmol/L (ref 98–111)
Creatinine, Ser: 0.9 mg/dL (ref 0.44–1.00)
GFR, Estimated: >60 mL/min (ref >60)
Glucose, Bld: 100 mg/dL — ABNORMAL HIGH (ref 70–99)
Potassium: 3.4 mmol/L — ABNORMAL LOW (ref 3.5–5.1)
Sodium: 135 mmol/L (ref 135–145)

## 2020-10-11 LAB — CBC
HCT: 32.6 % — ABNORMAL LOW (ref 36.0–46.0)
Hemoglobin: 11.4 g/dL — ABNORMAL LOW (ref 12.0–15.0)
MCH: 33.6 pg (ref 26.0–34.0)
MCHC: 35 g/dL (ref 30.0–36.0)
MCV: 96.2 fL (ref 80.0–100.0)
Platelets: 290 10*3/uL (ref 150–400)
RBC: 3.39 MIL/uL — ABNORMAL LOW (ref 3.87–5.11)
RDW: 13.9 % (ref 11.5–15.5)
WBC: 7.8 10*3/uL (ref 4.0–10.5)
nRBC: 0 % (ref 0.0–0.2)

## 2020-10-11 MED ORDER — NITROGLYCERIN 0.4 MG SL SUBL: 0.4000 mg | SUBLINGUAL_TABLET | SUBLINGUAL | 0 refills | Status: DC | PRN

## 2020-10-11 MED ORDER — ATORVASTATIN CALCIUM 40 MG PO TABS: 40.0000 mg | ORAL_TABLET | Freq: Every day | ORAL | 2 refills | Status: DC

## 2020-10-11 MED ORDER — DIGOXIN 125 MCG PO TABS: 0.1250 mg | ORAL_TABLET | Freq: Every day | ORAL | 2 refills | Status: DC

## 2020-10-11 MED ORDER — METOPROLOL TARTRATE 25 MG PO TABS: 12.5000 mg | ORAL_TABLET | Freq: Two times a day (BID) | ORAL | 2 refills | Status: DC

## 2020-10-11 MED ORDER — POTASSIUM CHLORIDE CRYS ER 20 MEQ PO TBCR: 40.0000 meq | EXTENDED_RELEASE_TABLET | Freq: Once | ORAL | Status: DC

## 2020-10-11 MED ORDER — DILTIAZEM HCL ER COATED BEADS 180 MG PO CP24: 180.0000 mg | ORAL_CAPSULE | Freq: Every day | ORAL | 2 refills | Status: DC

## 2020-10-11 MED ORDER — APIXABAN 5 MG PO TABS: 5.0000 mg | ORAL_TABLET | Freq: Two times a day (BID) | ORAL | 2 refills | Status: DC

## 2020-10-11 NOTE — Progress Notes (Signed)
Pt's HR in 140s-150s. NP paged to see if RN should give 10 PO meds now. Pt is asymptomatic. RN will continue to monitor.  NP gave verbal order over the phone to give 10 am PO meds now. RN gave meds and will continue to monitor.

## 2020-10-11 NOTE — Progress Notes (Signed)
Progress Note  Patient Name: Laurie James Laurie James Date of Encounter: 10/11/2020  [facility] Cardiologist: Charlton Haws, MD  Subjective  Wants to go home Afib is asymptomatic   Inpatient Medications    Scheduled Meds:  apixaban  5 mg Oral BID   atorvastatin  40 mg Oral Daily   digoxin  0.125 mg Oral Daily   diltiazem  180 mg Oral Daily   metoprolol tartrate  12.5 mg Oral BID   pantoprazole  40 mg Oral Daily   sodium chloride flush  3 mL Intravenous Q12H   sodium chloride flush  3 mL Intravenous Q12H   Continuous Infusions:  sodium chloride     PRN Meds: sodium chloride, acetaminophen, albuterol, nitroGLYCERIN, ondansetron (ZOFRAN) IV, polyethylene glycol, sodium chloride flush   Vital Signs    Vitals:   10/10/20 2242 10/11/20 0300 10/11/20 0301 10/11/20 0742  BP: 115/74 121/84  130/81  Pulse: 75 90 (!) 101 (!) 160  Resp: 20 20 19    Temp: 98.4 F (36.9 C) 97.6 F (36.4 C)    TempSrc: Oral Oral    SpO2: 97% 99% 95%   Weight:      Height:        Intake/Output Summary (Last 24 hours) at 10/11/2020 0817 Last data filed at 10/11/2020 0300 Gross per 24 hour  Intake 3 ml  Output 400 ml  Net -397 ml   Last 3 Weights 10/05/2020 10/05/2020 09/25/2019  Weight (lbs) 96 lb 93 lb 1.6 oz 97 lb  Weight (kg) 43.545 kg 42.23 kg 43.999 kg      Telemetry    Atrial fibrillation, rate mildly elevated at times- Personally Reviewed   Physical Exam   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 with macular degeneration Thin frail bronchitic female COPD exp rhonchi Right radial cath site A Abdomen soft No edema Palpable bilateral PTls   Labs    High Sensitivity Troponin:   Recent Labs  Lab 10/05/20 1347 10/05/20 1713  TROPONINIHS 14 16      Chemistry Recent Labs  Lab 10/05/20 1900 10/06/20 0044 10/09/20 0031 10/10/20 0224 10/11/20 0014  NA  --    < > 131* 134* 135  K  --    < > 3.7 3.5 3.4*  CL  --    < > 101 101 101  CO2  --    < > 20* 22 27  GLUCOSE  --    < > 109* 95 100*   BUN  --    < > 13 9 9   CREATININE  --    < > 0.83 0.77 0.90  CALCIUM  --    < > 8.4* 8.5* 8.5*  PROT 7.3  --   --   --   --   ALBUMIN 3.3*  --   --   --   --   AST 19  --   --   --   --   ALT 8  --   --   --   --   ALKPHOS 84  --   --   --   --   BILITOT 1.0  --   --   --   --   GFRNONAA  --    < > >60 >60 >60  ANIONGAP  --    < > 10 11 7    < > = values in this interval not displayed.     Hematology Recent Labs  Lab 10/09/20 0031 10/10/20 0224 10/11/20 0014  WBC 7.8  7.9 7.8  RBC 3.30* 3.41* 3.39*  HGB 10.7* 11.1* 11.4*  HCT 31.7* 32.4* 32.6*  MCV 96.1 95.0 96.2  MCH 32.4 32.6 33.6  MCHC 33.8 34.3 35.0  RDW 13.9 14.0 13.9  PLT 232 260 290    BNP Recent Labs  Lab 10/05/20 1902  BNP 733.2*     DDimer  Recent Labs  Lab 10/05/20 1900  DDIMER 2.08*     Patient Profile     75 y.o. female with past medical history of coronary artery disease, COPD, tobacco abuse admitted with chest pain.  Echocardiogram showed normal LV function, grade 2 diastolic dysfunction, moderate right ventricular enlargement with mild RV dysfunction, severe biatrial enlargement, mild mitral regurgitation, severe tricuspid regurgitation, mild aortic insufficiency.  Cardiac catheterization showed spasm of the ostium of her right coronary artery, 40% mid right coronary artery, 50 to 60% second diagonal and medical therapy recommended.  Patient developed atrial fibrillation during hospitalization as well.  Note patient had COPD on chest CT.  Assessment & Plan    1 Afib:  rates in 100 range asymptomatic continue dig, beta blocker and cardizem On eliquis. F/U afib clinic 2 week Not a great candidate for AAT   2 CAD:  not obstructive by cath this admission   3 Pulmonary HTN:  not a candidate for advanced Rx;s discussed smoking cessation   5 hyperlipidemia-continue statin.    D/C home  F/U afib clinic 2 weeks  For questions or updates, please contact [facility] Please consult  [email or web address] for contact info under        Signed, Charlton Haws, MD

## 2020-10-11 NOTE — Progress Notes (Signed)
RN went over d/c summary with pt and pt's son. NT removed PIV. Pt's belongings with son, who will transport her home. NT transported pt to private vehicle.

## 2020-10-11 NOTE — Progress Notes (Signed)
Pt's HR to 155 when up to BR, asymptomatic.  Will give AM meds now.  Will defer to Dr. Eden Emms for discharge.

## 2020-10-11 NOTE — Plan of Care (Signed)

## 2020-10-11 NOTE — Discharge Summary (Signed)
Discharge Summary    Patient ID: Laurie James MRN: [id number]; DOB: May 27, 1945  Admit date: 10/05/2020 Discharge date: 10/11/2020  PCP:  Georgann Housekeeper, MD   [facility] Providers Cardiologist:  Charlton Haws, MD      Discharge Diagnoses    Active Problems:   Chest pain at rest   Atrial fibrillation [facility])   Pulmonary HTN (HCC)   Tricuspid regurgitation   HLD (hyperlipidemia)   Hypotension   Hypokalemia  Diagnostic Studies/Procedures    Echocardiogram 10/06/2020   1. Left ventricular ejection fraction, by estimation, is 55 to 60%. The  left ventricle has normal function. The left ventricle has no regional  wall motion abnormalities. Left ventricular diastolic parameters are  consistent with Grade II diastolic  dysfunction (pseudonormalization).   2. Right ventricular systolic function is mildly reduced. The right  ventricular size is moderately enlarged. There is mildly elevated  pulmonary artery systolic pressure. The estimated right ventricular  systolic pressure is 44.2 mmHg.   3. Left atrial size was severely dilated.   4. Right atrial size was severely dilated.   5. The mitral valve is normal in structure. Mild mitral valve  regurgitation. No evidence of mitral stenosis.   6. Tricuspid valve regurgitation is severe.   7. The aortic valve is grossly normal. There is mild calcification of the  aortic valve. Aortic valve regurgitation is mild. No aortic stenosis is  present.   8. Pulmonic valve regurgitation is moderate.   9. The inferior vena cava is dilated in size with <50% respiratory  variability, suggesting right atrial pressure of 15 mmHg.  R/LHC 10/07/20:  2nd Diag lesion is 60% stenosed. Prox RCA lesion is 40% stenosed. Ost RCA lesion is 30% stenosed.    Diagnostic Dominance: Right    History of Present Illness     Laurie James Laurie James is a 75 y.o. female with a hx of CAD on CT 2013 with ca+ score of 103, no ischemia on  myoview 2016 with an EF at 67%, tobacco use, macular degeneration, COPD, and hiatal hernia who was seen 10/05/2020 for the evaluation of chest pain. She reported that her chest pain began the evening prior admission described as pain with radiation to her jaw and behind her shoulder blades and into shoulder with associated diaphoresis, nausea and lightheadedness which lasted about 3 hours. On ED arrival ECG showed SR with short PR and T wave inversion in the inferior leads 07/2019 however appeared more pronounced. Na 138 K+ 3.7 BUN 11, Cr 0.90 GFR > 60. Hs troponin 14, 16. Hgb 11.5 WBC 11.3 plts 194. BNP was 733.  Hospital Course     She underwent a coronary CTA in 2013 showed a coronary Ca score of 103 with moderate calcified plauqe in the prox to mid LAD < 50% and focal mixed plaque in the oRCA 50-70% and a chest CT 2 weeks ago for SOB and had been referred to Pulmonary for PHTN and right sided heart enlargement. Echocardiogram performed this admit confirms right sided enlargement with moderate RVE and mild RV dysfunction, severe BAE, severe TR, moderate PR and mild to moderate PHTN with PASP . Repeat chest CTA this admit showed no PE but did show CAD and emphysema. Plan was then to pursue LHC.  Prior to [facility] she went into AF with RVR treated with diltiazem, heparin infusion with anticipation to transition to Eliquis in the post cath setting. Cath perfformed 10/07/20 showed  non obstructive with 60% D2, 40% pRXA and  30% oRCA with spasm of the [facility] and low filling pressures. She continued to have persistent AF. Plan was to avoid amiodarone due to COPD. She was trailed on digoxin and was continued  on beta blocker and diltiazem PO. Plan to continue with Eliquis. We will send a message to schedule a follow up in the atrial fibrillation clinic in 2 weeks.   Hospital problems outlined below:   Chest pain -CP concerning given that it radiated into her neck and jaw as well as shoulder blades and was  associated with nausea and diaphoresis -hsTrop normal -CRF include ongoing tobacco use -Coronary CTA in 2013 showed a coronary Ca score of 103 with moderate calcified plauqe in the prox to mid LAD < 50% and focal mixed plaque in the oRCA 50-70%.   -Chest CT 2 weeks prior to admission for SOB and had been referred to Pulmonary for PHTN and right sided heart enlargement -Echocardiogram this admit confirms right sided enlargement with moderate RVE and mild RV dysfunction, severe BAE, severe TR, moderate PR and mild to moderate PHTN with PASP 75mmHg>>suspect related to COPD -Repeat Chest CTA this admit showed no PE but did show CAD and emphysema -Heart cath showed mild non obstructive CAD with 60% D2, 40% pRXA and 30% oRCA with spasm of the [facility] and low filling pressures.   -Medical management recommended -Continue statin and BB -No ASA due to need for DOAC for afib    Pulmonary HTN/Right heart enlargement/Severe TR -Noted on echo associated with dilated right heart and severe TR -RHC showed low filling pressures with RAP , RVP 16/57mmHg, PAP 17/2 with mean , PCWP V and mean , LVEDP with CO low at 2.5L/min likely related to volume depletion -Follow for TR as outpt   ASCAD -Coronary CTA in 2013 showed coronary Ca score of 103 with moderate calcified plauqe in the prox to mid LAD < 50% and focal mixed plaque in the oRCA 50-70%.   -Heart cath showed mild non obstructive CAD with 60% D2, 40% pRXA and 30% oRCA with spasm of the oRCA and low filling pressures.   -Continue statin, BB -No ASA due to need for DOAC   HLD -LDL goal < 70 -LDL 74 this admission -Atorvastatin increased to 80mg  daily -Needs follow up FLP and ALT in 6 weeks   New onset atrial fibrillation -Went into afib with RVR and remains in afib with CVR on tele previously on IV Cardizem gtt which was transitioned to PO dosing at 180mg  QD -BP 120/82>>>127/77>>130/81 -Continue Eliquis per pharmacy for  [facility] score of 4   Hypotension -Stabilized, 120/82>>>127/77>>130/81 -Felt to be related to volume depletion  Hypokalemia: -K+, 3.4 today  -Replaced with once prior to d/James    Consultants: None    The patient was seen and examined by [facility]. [facility] who feels that she is stable and ready for discharge today, 10/11/20.   Did the patient have an acute coronary syndrome (MI, NSTEMI, STEMI, etc) this admission?:  No                               Did the patient have a percutaneous coronary intervention (stent / angioplasty)?:  No.   ____________  Discharge Vitals Blood pressure 115/77, pulse 83, temperature 98 F (36.7 James), temperature source Oral, resp. rate 20, height 4\' 8"  (1.422 m), weight 43.5 kg, SpO2 97 %.  Filed Weights   10/05/20 2059 10/05/20  2210  Weight: 42.2 kg 43.5 kg    Labs & Radiologic Studies    CBC Recent Labs    10/10/20 0224 10/11/20 0014  WBC 7.9 7.8  HGB 11.1* 11.4*  HCT 32.4* 32.6*  MCV 95.0 96.2  PLT 260 290   Basic Metabolic Panel Recent Labs    59/93/57 0224 10/11/20 0014  NA 134* 135  K 3.5 3.4*  CL 101 101  CO2 22 27  GLUCOSE 95 100*  BUN 9 9  CREATININE 0.77 0.90  CALCIUM 8.5* 8.5*   Liver Function Tests No results for input(s): AST, ALT, ALKPHOS, BILITOT, PROT, ALBUMIN in the last 72 hours. No results for input(s): LIPASE, AMYLASE in the last 72 hours. High Sensitivity Troponin:   Recent Labs  Lab 10/05/20 1347 10/05/20 1713  TROPONINIHS 14 16    BNP Invalid input(s): POCBNP D-Dimer No results for input(s): DDIMER in the last 72 hours. Hemoglobin A1C No results for input(s): HGBA1C in the last 72 hours. Fasting Lipid Panel No results for input(s): CHOL, HDL, LDLCALC, TRIG, CHOLHDL, LDLDIRECT in the last 72 hours. Thyroid Function Tests No results for input(s): TSH, T4TOTAL, T3FREE, THYROIDAB in the last 72 hours.  Invalid input(s): FREET3 _____________  DG Chest 1 View  Result Date: 10/05/2020 CLINICAL DATA:   Chest pain EXAM: CHEST  1 VIEW COMPARISON:  Chest CT 09/21/2020.  Chest x-ray 08/04/2013. FINDINGS: Lungs are hyperexpanded. Interstitial markings are diffusely coarsened with chronic features. The lungs are clear without focal pneumonia, edema, pneumothorax or pleural effusion. Bones are diffusely demineralized. IMPRESSION: Hyperexpansion with chronic interstitial coarsening. No acute cardiopulmonary findings. Electronically Signed   By: Kennith Center M.D.   On: 10/05/2020 15:02   CT Head Wo Contrast  Result Date: 10/05/2020 CLINICAL DATA:  Worsening headache. EXAM: CT HEAD WITHOUT CONTRAST TECHNIQUE: Contiguous axial images were obtained from the base of the skull through the vertex without intravenous contrast. COMPARISON:  July 12, 2020 FINDINGS: Brain: There is mild cerebral atrophy with widening of the extra-axial spaces and ventricular dilatation. There are areas of decreased attenuation within the white matter tracts of the supratentorial brain, consistent with microvascular disease changes. Vascular: No hyperdense vessel or unexpected calcification. Skull: Normal. Negative for fracture or focal lesion. Sinuses/Orbits: No acute finding. Other: None. IMPRESSION: 1. Generalized cerebral atrophy. 2. No acute intracranial abnormality. Electronically Signed   By: Aram Candela M.D.   On: 10/05/2020 15:55   CT CHEST W CONTRAST  Result Date: 09/22/2020 CLINICAL DATA:  Pulmonary nodule follow-up. EXAM: CT CHEST WITH CONTRAST TECHNIQUE: Multidetector CT imaging of the chest was performed during intravenous contrast administration. CONTRAST:  28mL ISOVUE-300 IOPAMIDOL (ISOVUE-300) INJECTION 61% COMPARISON:  CT cervical spine dated July 12, 2020. CT chest dated October 09, 2016. FINDINGS: Cardiovascular: Progressive cardiomegaly with severe right heart enlargement. No pericardial effusion. No thoracic aortic aneurysm or dissection. Coronary, aortic arch, and branch vessel atherosclerotic vascular disease. No  pulmonary embolism. Reflux of contrast into the IVC and hepatic veins. Mediastinum/Nodes: No enlarged mediastinal, hilar, or axillary lymph nodes. Thyroid gland, trachea, and esophagus demonstrate no significant findings. Lungs/Pleura: Scattered small bilateral pulmonary nodules throughout both lungs are unchanged in size, number, and distribution since 2018. Extensive biapical nodular pleuroparenchymal thickening, scarring, architectural distortion, similar to 2018. Centrilobular and paraseptal emphysema again noted. No focal consolidation, pleural effusion, or pneumothorax. Upper Abdomen: No acute abnormality. Musculoskeletal: No chest wall abnormality. No acute or significant osseous findings. IMPRESSION: 1. Scattered small bilateral pulmonary nodules throughout both lungs are unchanged  in size, number, and distribution since 2018. Findings are consistent with a benign etiology. 2. Progressive cardiomegaly with severe right heart enlargement and reflux of contrast into the IVC and hepatic veins, consistent with right heart dysfunction. 3. Aortic Atherosclerosis (ICD10-I70.0) and Emphysema (ICD10-J43.9). Electronically Signed   By: Obie Dredge M.D.   On: 09/22/2020 10:12   CT Angio Chest Pulmonary Embolism (PE) W or WO Contrast  Result Date: 10/05/2020 CLINICAL DATA:  Positive D-dimer EXAM: CT ANGIOGRAPHY CHEST WITH CONTRAST TECHNIQUE: Multidetector CT imaging of the chest was performed using the standard protocol during bolus administration of intravenous contrast. Multiplanar CT image reconstructions and MIPs were obtained to evaluate the vascular anatomy. CONTRAST:  42mL OMNIPAQUE IOHEXOL 350 MG/ML SOLN COMPARISON:  10/21/2020 FINDINGS: Cardiovascular: No filling defects in the pulmonary arteries to suggest pulmonary emboli. Coronary artery and aortic calcifications. Heart is borderline in size. No aneurysm. Mediastinum/Nodes: No mediastinal, hilar, or axillary adenopathy. Trachea and esophagus are  unremarkable. Thyroid unremarkable. Lungs/Pleura: Moderate centrilobular emphysema. Extensive scarring in the apices bilaterally, stable since prior study. Scattered bilateral pulmonary nodules throughout both lungs, stable. No effusions. No acute confluent opacities. Upper Abdomen: Imaging into the upper abdomen demonstrates no acute findings. Musculoskeletal: Chest wall soft tissues are unremarkable. No acute bony abnormality. Review of the MIP images confirms the above findings. IMPRESSION: No evidence of pulmonary embolus. Scattered bilateral small pulmonary nodules are unchanged in size, number and appearance since prior study and dating back to 2018. Cardiomegaly, coronary artery disease. Aortic Atherosclerosis (ICD10-I70.0) and Emphysema (ICD10-J43.9). Electronically Signed   By: Charlett Nose M.D.   On: 10/05/2020 21:38   CARDIAC CATHETERIZATION  Result Date: 10/07/2020 Formatting of this result is different from the original. Images from the original result were not included.  2nd Diag lesion is 60% stenosed.  Prox RCA lesion is 40% stenosed.  Ost RCA lesion is 30% stenosed.  Laurie James Etha Suman is a 75 y.o. female  [id number] LOCATION:  FACILITY: [facility] PHYSICIAN: [name], M.D. [birth year] DATE OF PROCEDURE:  10/07/2020 DATE OF DISCHARGE: CARDIAC CATHETERIZATION History obtained from chart review.75 y.o. female with hx of CAD on CT 2013 with ca+ score of 103, no ischemia on myoview 2016, EF 67% , smoker, macular degeneration, COPD, hiatal Herna who is being seen 10/05/2020 for the evaluation of chest pain.  She did go into atrial fibrillation.  LV function was preserved although her RV was dilated.  Her enzymes were low.  She presents now for right left heart cath.   Ms. Fehler has nonobstructive coronary artery disease.  She did have a spasm of the ostium of her large dominant RCA without damping.  The initial nonselective shot did not show a tight stenosis.  She did have 40% segmental mid  RCA stenosis as well as 50 to 60% segmental second diagonal branch stenosis all of which can be treated medically.  Her systolic pressure was low as was her EDP and filling pressures.  She will need to be treated with a DOAC for her A. fib.  Medical therapy otherwise.  The radial sheath was removed and a TR band was placed on the right wrist to achieve patent hemostasis.  The patient left the lab in stable condition. [name]. MD, [facility] 10/07/2020 9:31 AM   ECHOCARDIOGRAM COMPLETE  Result Date: 10/06/2020    ECHOCARDIOGRAM REPORT   Patient Name:   [facility] [name] Date of Exam: 10/06/2020 Medical Rec #:  [id number]  Height:       56.0 in Accession #:    [id number]                  Weight:       96.0 lb Date of Birth:  01/24/1946                   BSA:          1.299 m Patient Age:    75 years                    BP:           129/81 mmHg Patient Gender: F                           HR:           93 bpm. Exam Location:  Inpatient Procedure: 2D Echo, Color Doppler and Cardiac Doppler Indications:    Chest pain  History:        Patient has no prior history of Echocardiogram examinations.                 COPD; Risk Factors:Current Smoker.  Sonographer:    Shirlean Kelly Referring Phys: [address] [place]  Sonographer Comments: Technically difficult study due to poor echo windows. IMPRESSIONS  1. Left ventricular ejection fraction, by estimation, is 55 to 60%. The left ventricle has normal function. The left ventricle has no regional wall motion abnormalities. Left ventricular diastolic parameters are consistent with Grade II diastolic dysfunction (pseudonormalization).  2. Right ventricular systolic function is mildly reduced. The right ventricular size is moderately enlarged. There is mildly elevated pulmonary artery systolic pressure. The estimated right ventricular systolic pressure is 44.2 mmHg.  3. Left atrial size was severely dilated.  4. Right atrial size was severely dilated.   5. The mitral valve is normal in structure. Mild mitral valve regurgitation. No evidence of mitral stenosis.  6. Tricuspid valve regurgitation is severe.  7. The aortic valve is grossly normal. There is mild calcification of the aortic valve. Aortic valve regurgitation is mild. No aortic stenosis is present.  8. Pulmonic valve regurgitation is moderate.  9. The inferior vena cava is dilated in size with <50% respiratory variability, suggesting right atrial pressure of 15 mmHg. FINDINGS  Left Ventricle: Left ventricular ejection fraction, by estimation, is 55 to 60%. The left ventricle has normal function. The left ventricle has no regional wall motion abnormalities. The left ventricular internal cavity size was normal in size. There is  no left ventricular hypertrophy. Left ventricular diastolic parameters are consistent with Grade II diastolic dysfunction (pseudonormalization). Right Ventricle: The right ventricular size is moderately enlarged. No increase in right ventricular wall thickness. Right ventricular systolic function is mildly reduced. There is mildly elevated pulmonary artery systolic pressure. The tricuspid regurgitant velocity is 2.70 m/s, and with an assumed right atrial pressure of 15 mmHg, the estimated right ventricular systolic pressure is 44.2 mmHg. Left Atrium: Left atrial size was severely dilated. Right Atrium: Right atrial size was severely dilated. Pericardium: There is no evidence of pericardial effusion. Mitral Valve: The mitral valve is normal in structure. Mild mitral valve regurgitation. No evidence of mitral valve stenosis. Tricuspid Valve: The tricuspid valve is grossly normal. Tricuspid valve regurgitation is severe. No evidence of tricuspid stenosis. Aortic Valve: The aortic valve is grossly normal. There is mild calcification of the aortic valve. Aortic valve  regurgitation is mild. No aortic stenosis is present. Aortic valve mean gradient measures 2.0 mmHg. Aortic valve peak  gradient measures 4.3 mmHg. Aortic valve area, by VTI measures 1.50 cm. Pulmonic Valve: The pulmonic valve was normal in structure. Pulmonic valve regurgitation is moderate. No evidence of pulmonic stenosis. Aorta: The aortic root is normal in size and structure. Venous: The inferior vena cava is dilated in size with less than 50% respiratory variability, suggesting right atrial pressure of 15 mmHg. The inferior vena cava and the hepatic vein show a pattern of systolic flow reversal, suggestive of tricuspid regurgitation. IAS/Shunts: No atrial level shunt detected by color flow Doppler.  LEFT VENTRICLE PLAX 2D LVIDd:         4.00 cm  Diastology LVIDs:         3.00 cm  LV e' medial:    6.31 cm/s LV PW:         1.00 cm  LV E/e' medial:  9.5 LV IVS:        0.80 cm  LV e' lateral:   7.18 cm/s LVOT diam:     1.90 cm  LV E/e' lateral: 8.4 LV SV:         29 LV SV Index:   22 LVOT Area:     2.84 cm  RIGHT VENTRICLE             IVC RV Basal diam:  3.70 cm     IVC diam: 2.20 cm RV S prime:     11.70 cm/s LEFT ATRIUM             Index       RIGHT ATRIUM           Index LA diam:        3.30 cm 2.54 cm/m  RA Area:     25.50 cm LA Vol (A2C):   80.0 ml 61.56 ml/m RA Volume:   90.40 ml  69.57 ml/m LA Vol (A4C):   88.7 ml 68.26 ml/m LA Biplane Vol: 84.4 ml 64.95 ml/m  AORTIC VALVE AV Area (Vmax):    1.52 cm AV Area (Vmean):   1.34 cm AV Area (VTI):     1.50 cm AV Vmax:           104.00 cm/s AV Vmean:          69.900 cm/s AV VTI:            0.191 m AV Peak Grad:      4.3 mmHg AV Mean Grad:      2.0 mmHg LVOT Vmax:         55.60 cm/s LVOT Vmean:        33.000 cm/s LVOT VTI:          0.101 m LVOT/AV VTI ratio: 0.53  AORTA Ao Root diam: 2.60 cm Ao Asc diam:  3.00 cm MITRAL VALVE               TRICUSPID VALVE MV Area (PHT): 3.99 cm    TR Peak grad:   29.2 mmHg MV Decel Time: 190 msec    TR Vmax:        270.00 cm/s MV E velocity: 60.20 cm/s MV A velocity: 59.50 cm/s  SHUNTS MV E/A ratio:  1.01        Systemic VTI:  0.10 m  Systemic Diam: 1.90 cm Weston BrassGayatri Acharya MD Electronically signed by Weston BrassGayatri Acharya MD Signature Date/Time: 10/06/2020/10:39:24 AM    Final    Disposition   Pt is being discharged home today in good condition.  Follow-up Plans & Appointments    Follow-up Information     [facility] Follow up.   Specialty: Cardiology Why: The office will call you and inform you of the date and time of your appointment Contact information: [address] [id number] [place] [address] [place] Laurie James [id number] [phone]        Wendall StadeNishan, Laurie C, MD Follow up on 11/10/2020.   Specialty: Cardiology Why: at 900am Contact information: [address] [place] [address] [phone]                Discharge Medications   Allergies as of 10/11/2020       Reactions   Codeine Nausea And Vomiting        Medication List     STOP taking these medications    BC HEADACHE POWDER PO       TAKE these medications    albuterol 108 (90 Base) MCG/ACT inhaler Commonly known as: VENTOLIN HFA Inhale 2 puffs into the lungs every 4 (four) hours as needed for wheezing.   apixaban 5 MG Tabs tablet Commonly known as: ELIQUIS Take 1 tablet (5 mg total) by mouth 2 (two) times daily.   atorvastatin 40 MG tablet Commonly known as: LIPITOR Take 1 tablet (40 mg total) by mouth daily. Start taking on: October 12, 2020   citalopram 20 MG tablet Commonly known as: CELEXA Take 20 mg by mouth at bedtime.   cyanocobalamin 1000 MCG/ML injection Commonly known as: (VITAMIN B-12) Inject 1,000 mcg into the muscle every 30 (thirty) days.   digoxin 0.125 MG tablet Commonly known as: LANOXIN Take 1 tablet (0.125 mg total) by mouth daily. Start taking on: October 12, 2020   diltiazem 180 MG 24 hr capsule Commonly known as: CARDIZEM CD Take 1 capsule (180 mg total) by mouth daily. Start taking on: October 12, 2020   esomeprazole 40 MG  capsule Commonly known as: NEXIUM Take 40 mg by mouth daily.   ibuprofen 200 MG tablet Commonly known as: ADVIL Take 400 mg by mouth every 6 (six) hours as needed for headache.   metoprolol tartrate 25 MG tablet Commonly known as: LOPRESSOR Take 0.5 tablets (12.5 mg total) by mouth 2 (two) times daily.   nitroGLYCERIN 0.4 MG SL tablet Commonly known as: NITROSTAT Place 1 tablet (0.4 mg total) under the tongue every 5 (five) minutes x 3 doses as needed for chest pain.   promethazine 25 MG tablet Commonly known as: PHENERGAN Take 25 mg by mouth 3 (three) times daily as needed for nausea.         Outstanding Labs/Studies   BMET at AF clinic follow up   Duration of Discharge Encounter   Greater than 30 minutes including physician time.  Signed, Georgie ChardJill Ebubechukwu Jedlicka, NP 10/11/2020, 11:29 AM

## 2020-10-15 ENCOUNTER — Other Ambulatory Visit (HOSPITAL_COMMUNITY): Payer: Medicare Other

## 2020-10-15 ENCOUNTER — Encounter (HOSPITAL_COMMUNITY): Payer: Self-pay | Admitting: Cardiovascular Disease

## 2020-10-19 ENCOUNTER — Emergency Department (HOSPITAL_COMMUNITY): Payer: Medicare Other

## 2020-10-19 ENCOUNTER — Encounter (HOSPITAL_COMMUNITY): Payer: Self-pay | Admitting: Emergency Medicine

## 2020-10-19 ENCOUNTER — Inpatient Hospital Stay (HOSPITAL_COMMUNITY)
Admission: EM | Admit: 2020-10-19 | Discharge: 2020-11-04 | DRG: 291 | Disposition: A | Discharge status: Home Health | Payer: Medicare Other | Attending: Internal Medicine | Admitting: Internal Medicine

## 2020-10-19 DIAGNOSIS — I2510 Atherosclerotic heart disease of native coronary artery without angina pectoris: Secondary | ICD-10-CM | POA: Diagnosis present

## 2020-10-19 DIAGNOSIS — Z803 Family history of malignant neoplasm of breast: Secondary | ICD-10-CM

## 2020-10-19 DIAGNOSIS — Z48813 Encounter for surgical aftercare following surgery on the respiratory system: Secondary | ICD-10-CM | POA: Diagnosis not present

## 2020-10-19 DIAGNOSIS — I4819 Other persistent atrial fibrillation: Secondary | ICD-10-CM | POA: Diagnosis not present

## 2020-10-19 DIAGNOSIS — H548 Legal blindness, as defined in USA: Secondary | ICD-10-CM | POA: Diagnosis present

## 2020-10-19 DIAGNOSIS — I499 Cardiac arrhythmia, unspecified: Secondary | ICD-10-CM | POA: Diagnosis not present

## 2020-10-19 DIAGNOSIS — I4891 Unspecified atrial fibrillation: Secondary | ICD-10-CM | POA: Diagnosis present

## 2020-10-19 DIAGNOSIS — R945 Abnormal results of liver function studies: Secondary | ICD-10-CM | POA: Diagnosis not present

## 2020-10-19 DIAGNOSIS — I071 Rheumatic tricuspid insufficiency: Secondary | ICD-10-CM | POA: Diagnosis not present

## 2020-10-19 DIAGNOSIS — I513 Intracardiac thrombosis, not elsewhere classified: Secondary | ICD-10-CM | POA: Diagnosis present

## 2020-10-19 DIAGNOSIS — Z20822 Contact with and (suspected) exposure to covid-19: Secondary | ICD-10-CM | POA: Diagnosis present

## 2020-10-19 DIAGNOSIS — I313 Pericardial effusion (noninflammatory): Secondary | ICD-10-CM | POA: Diagnosis not present

## 2020-10-19 DIAGNOSIS — J9601 Acute respiratory failure with hypoxia: Secondary | ICD-10-CM | POA: Diagnosis present

## 2020-10-19 DIAGNOSIS — R54 Age-related physical debility: Secondary | ICD-10-CM | POA: Diagnosis present

## 2020-10-19 DIAGNOSIS — K6389 Other specified diseases of intestine: Secondary | ICD-10-CM | POA: Diagnosis not present

## 2020-10-19 DIAGNOSIS — K567 Ileus, unspecified: Secondary | ICD-10-CM | POA: Diagnosis not present

## 2020-10-19 DIAGNOSIS — E872 Acidosis: Secondary | ICD-10-CM | POA: Diagnosis present

## 2020-10-19 DIAGNOSIS — E871 Hypo-osmolality and hyponatremia: Secondary | ICD-10-CM | POA: Diagnosis not present

## 2020-10-19 DIAGNOSIS — J449 Chronic obstructive pulmonary disease, unspecified: Secondary | ICD-10-CM | POA: Diagnosis not present

## 2020-10-19 DIAGNOSIS — I2583 Coronary atherosclerosis due to lipid rich plaque: Secondary | ICD-10-CM | POA: Diagnosis not present

## 2020-10-19 DIAGNOSIS — Z6821 Body mass index (BMI) 21.0-21.9, adult: Secondary | ICD-10-CM

## 2020-10-19 DIAGNOSIS — J410 Simple chronic bronchitis: Secondary | ICD-10-CM | POA: Diagnosis not present

## 2020-10-19 DIAGNOSIS — Y95 Nosocomial condition: Secondary | ICD-10-CM | POA: Diagnosis present

## 2020-10-19 DIAGNOSIS — N179 Acute kidney failure, unspecified: Secondary | ICD-10-CM

## 2020-10-19 DIAGNOSIS — I4821 Permanent atrial fibrillation: Secondary | ICD-10-CM | POA: Diagnosis not present

## 2020-10-19 DIAGNOSIS — E785 Hyperlipidemia, unspecified: Secondary | ICD-10-CM | POA: Diagnosis present

## 2020-10-19 DIAGNOSIS — R0602 Shortness of breath: Secondary | ICD-10-CM

## 2020-10-19 DIAGNOSIS — J439 Emphysema, unspecified: Secondary | ICD-10-CM | POA: Diagnosis not present

## 2020-10-19 DIAGNOSIS — N1330 Unspecified hydronephrosis: Secondary | ICD-10-CM | POA: Diagnosis present

## 2020-10-19 DIAGNOSIS — R310 Gross hematuria: Secondary | ICD-10-CM | POA: Diagnosis present

## 2020-10-19 DIAGNOSIS — D72829 Elevated white blood cell count, unspecified: Secondary | ICD-10-CM | POA: Diagnosis not present

## 2020-10-19 DIAGNOSIS — F17210 Nicotine dependence, cigarettes, uncomplicated: Secondary | ICD-10-CM | POA: Diagnosis present

## 2020-10-19 DIAGNOSIS — Z95828 Presence of other vascular implants and grafts: Secondary | ICD-10-CM

## 2020-10-19 DIAGNOSIS — I5033 Acute on chronic diastolic (congestive) heart failure: Secondary | ICD-10-CM

## 2020-10-19 DIAGNOSIS — R531 Weakness: Secondary | ICD-10-CM

## 2020-10-19 DIAGNOSIS — H3530 Unspecified macular degeneration: Secondary | ICD-10-CM | POA: Diagnosis present

## 2020-10-19 DIAGNOSIS — E43 Unspecified severe protein-calorie malnutrition: Secondary | ICD-10-CM | POA: Diagnosis not present

## 2020-10-19 DIAGNOSIS — K7200 Acute and subacute hepatic failure without coma: Secondary | ICD-10-CM | POA: Diagnosis not present

## 2020-10-19 DIAGNOSIS — F32A Depression, unspecified: Secondary | ICD-10-CM | POA: Diagnosis not present

## 2020-10-19 DIAGNOSIS — M419 Scoliosis, unspecified: Secondary | ICD-10-CM | POA: Diagnosis not present

## 2020-10-19 DIAGNOSIS — J441 Chronic obstructive pulmonary disease with (acute) exacerbation: Secondary | ICD-10-CM | POA: Diagnosis present

## 2020-10-19 DIAGNOSIS — J189 Pneumonia, unspecified organism: Secondary | ICD-10-CM | POA: Diagnosis present

## 2020-10-19 DIAGNOSIS — I110 Hypertensive heart disease with heart failure: Principal | ICD-10-CM | POA: Diagnosis present

## 2020-10-19 DIAGNOSIS — R7989 Other specified abnormal findings of blood chemistry: Secondary | ICD-10-CM | POA: Diagnosis not present

## 2020-10-19 DIAGNOSIS — R570 Cardiogenic shock: Secondary | ICD-10-CM | POA: Diagnosis not present

## 2020-10-19 DIAGNOSIS — R069 Unspecified abnormalities of breathing: Secondary | ICD-10-CM | POA: Diagnosis not present

## 2020-10-19 DIAGNOSIS — I088 Other rheumatic multiple valve diseases: Secondary | ICD-10-CM | POA: Diagnosis not present

## 2020-10-19 DIAGNOSIS — I517 Cardiomegaly: Secondary | ICD-10-CM | POA: Diagnosis not present

## 2020-10-19 DIAGNOSIS — F419 Anxiety disorder, unspecified: Secondary | ICD-10-CM | POA: Diagnosis present

## 2020-10-19 DIAGNOSIS — E876 Hypokalemia: Secondary | ICD-10-CM | POA: Diagnosis not present

## 2020-10-19 DIAGNOSIS — K761 Chronic passive congestion of liver: Secondary | ICD-10-CM | POA: Diagnosis not present

## 2020-10-19 DIAGNOSIS — K3189 Other diseases of stomach and duodenum: Secondary | ICD-10-CM | POA: Diagnosis not present

## 2020-10-19 DIAGNOSIS — J918 Pleural effusion in other conditions classified elsewhere: Secondary | ICD-10-CM | POA: Diagnosis not present

## 2020-10-19 DIAGNOSIS — Z743 Need for continuous supervision: Secondary | ICD-10-CM | POA: Diagnosis not present

## 2020-10-19 DIAGNOSIS — I371 Nonrheumatic pulmonary valve insufficiency: Secondary | ICD-10-CM | POA: Diagnosis present

## 2020-10-19 DIAGNOSIS — R7401 Elevation of levels of liver transaminase levels: Secondary | ICD-10-CM

## 2020-10-19 DIAGNOSIS — I272 Pulmonary hypertension, unspecified: Secondary | ICD-10-CM | POA: Diagnosis not present

## 2020-10-19 DIAGNOSIS — R64 Cachexia: Secondary | ICD-10-CM | POA: Diagnosis present

## 2020-10-19 DIAGNOSIS — R0902 Hypoxemia: Secondary | ICD-10-CM | POA: Diagnosis not present

## 2020-10-19 DIAGNOSIS — I428 Other cardiomyopathies: Secondary | ICD-10-CM | POA: Diagnosis not present

## 2020-10-19 DIAGNOSIS — R110 Nausea: Secondary | ICD-10-CM | POA: Diagnosis not present

## 2020-10-19 DIAGNOSIS — R000 Tachycardia, unspecified: Secondary | ICD-10-CM | POA: Diagnosis not present

## 2020-10-19 DIAGNOSIS — R091 Pleurisy: Secondary | ICD-10-CM | POA: Diagnosis not present

## 2020-10-19 DIAGNOSIS — I959 Hypotension, unspecified: Secondary | ICD-10-CM | POA: Diagnosis present

## 2020-10-19 DIAGNOSIS — K8020 Calculus of gallbladder without cholecystitis without obstruction: Secondary | ICD-10-CM | POA: Diagnosis not present

## 2020-10-19 DIAGNOSIS — J90 Pleural effusion, not elsewhere classified: Secondary | ICD-10-CM

## 2020-10-19 DIAGNOSIS — J440 Chronic obstructive pulmonary disease with acute lower respiratory infection: Secondary | ICD-10-CM | POA: Diagnosis present

## 2020-10-19 DIAGNOSIS — K838 Other specified diseases of biliary tract: Secondary | ICD-10-CM | POA: Diagnosis not present

## 2020-10-19 DIAGNOSIS — K921 Melena: Secondary | ICD-10-CM | POA: Diagnosis not present

## 2020-10-19 DIAGNOSIS — K449 Diaphragmatic hernia without obstruction or gangrene: Secondary | ICD-10-CM | POA: Diagnosis present

## 2020-10-19 DIAGNOSIS — Z7901 Long term (current) use of anticoagulants: Secondary | ICD-10-CM

## 2020-10-19 DIAGNOSIS — R079 Chest pain, unspecified: Secondary | ICD-10-CM | POA: Diagnosis not present

## 2020-10-19 DIAGNOSIS — I509 Heart failure, unspecified: Secondary | ICD-10-CM | POA: Diagnosis not present

## 2020-10-19 DIAGNOSIS — R1013 Epigastric pain: Secondary | ICD-10-CM | POA: Diagnosis not present

## 2020-10-19 DIAGNOSIS — Z79899 Other long term (current) drug therapy: Secondary | ICD-10-CM

## 2020-10-19 DIAGNOSIS — I700 Atherosclerosis of aorta: Secondary | ICD-10-CM | POA: Diagnosis not present

## 2020-10-19 DIAGNOSIS — J9811 Atelectasis: Secondary | ICD-10-CM | POA: Diagnosis not present

## 2020-10-19 DIAGNOSIS — I5043 Acute on chronic combined systolic (congestive) and diastolic (congestive) heart failure: Secondary | ICD-10-CM | POA: Diagnosis present

## 2020-10-19 DIAGNOSIS — K219 Gastro-esophageal reflux disease without esophagitis: Secondary | ICD-10-CM | POA: Diagnosis present

## 2020-10-19 DIAGNOSIS — G4733 Obstructive sleep apnea (adult) (pediatric): Secondary | ICD-10-CM

## 2020-10-19 LAB — COMPREHENSIVE METABOLIC PANEL
ALT: 103 U/L — ABNORMAL HIGH (ref 0–44)
AST: 165 U/L — ABNORMAL HIGH (ref 15–41)
Albumin: 2.8 g/dL — ABNORMAL LOW (ref 3.5–5.0)
Alkaline Phosphatase: 161 U/L — ABNORMAL HIGH (ref 38–126)
Anion gap: 14 (ref 5–15)
BUN: 25 mg/dL — ABNORMAL HIGH (ref 8–23)
CO2: 21 mmol/L — ABNORMAL LOW (ref 22–32)
Calcium: 8.6 mg/dL — ABNORMAL LOW (ref 8.9–10.3)
Chloride: 95 mmol/L — ABNORMAL LOW (ref 98–111)
Creatinine, Ser: 0.96 mg/dL (ref 0.44–1.00)
GFR, Estimated: >60 mL/min (ref >60)
Glucose, Bld: 145 mg/dL — ABNORMAL HIGH (ref 70–99)
Potassium: 4.5 mmol/L (ref 3.5–5.1)
Sodium: 130 mmol/L — ABNORMAL LOW (ref 135–145)
Total Bilirubin: 1.8 mg/dL — ABNORMAL HIGH (ref 0.3–1.2)
Total Protein: 6.8 g/dL (ref 6.5–8.1)

## 2020-10-19 LAB — CBC WITH DIFFERENTIAL/PLATELET
Abs Immature Granulocytes: 0.1 10*3/uL — ABNORMAL HIGH (ref 0.00–0.07)
Basophils Absolute: 0 10*3/uL (ref 0.0–0.1)
Basophils Relative: 0 %
Eosinophils Absolute: 0 10*3/uL (ref 0.0–0.5)
Eosinophils Relative: 0 %
HCT: 41.5 % (ref 36.0–46.0)
Hemoglobin: 13.9 g/dL (ref 12.0–15.0)
Immature Granulocytes: 1 %
Lymphocytes Relative: 4 %
Lymphs Abs: 0.6 10*3/uL — ABNORMAL LOW (ref 0.7–4.0)
MCH: 32.8 pg (ref 26.0–34.0)
MCHC: 33.5 g/dL (ref 30.0–36.0)
MCV: 97.9 fL (ref 80.0–100.0)
Monocytes Absolute: 1 10*3/uL (ref 0.1–1.0)
Monocytes Relative: 7 %
Neutro Abs: 13.1 10*3/uL — ABNORMAL HIGH (ref 1.7–7.7)
Neutrophils Relative %: 88 %
Platelets: 558 10*3/uL — ABNORMAL HIGH (ref 150–400)
RBC: 4.24 MIL/uL (ref 3.87–5.11)
RDW: 15 % (ref 11.5–15.5)
WBC: 14.8 10*3/uL — ABNORMAL HIGH (ref 4.0–10.5)
nRBC: 0 % (ref 0.0–0.2)

## 2020-10-19 LAB — TROPONIN I (HIGH SENSITIVITY): Troponin I (High Sensitivity): 25 ng/L — ABNORMAL HIGH (ref <18)

## 2020-10-19 LAB — RESP PANEL BY RT-PCR (FLU A&B, COVID) ARPGX2
Influenza A by PCR: NEGATIVE
Influenza B by PCR: NEGATIVE
SARS Coronavirus 2 by RT PCR: NEGATIVE

## 2020-10-19 LAB — BRAIN NATRIURETIC PEPTIDE: B Natriuretic Peptide: 895.8 pg/mL — ABNORMAL HIGH (ref 0.0–100.0)

## 2020-10-19 MED ORDER — SODIUM CHLORIDE 0.9 % IV SOLN
500.0000 mg | Freq: Once | INTRAVENOUS | Status: AC
  Administered 2020-10-20: 500 mg via INTRAVENOUS
  Filled 2020-10-19: qty 500

## 2020-10-19 MED ORDER — ENOXAPARIN SODIUM 40 MG/0.4ML IJ SOSY: 40.0000 mg | PREFILLED_SYRINGE | INTRAMUSCULAR | Status: DC

## 2020-10-19 MED ORDER — SODIUM CHLORIDE 0.9 % IV SOLN
1.0000 g | Freq: Once | INTRAVENOUS | Status: AC
  Administered 2020-10-20: 1 g via INTRAVENOUS
  Filled 2020-10-19: qty 10

## 2020-10-19 NOTE — ED Provider Notes (Signed)
[facility] EMERGENCY DEPARTMENT Provider Note   CSN: [id number] Arrival date & time:        History Chief Complaint  Patient presents with   Weakness    Laurie James is a 75 y.o. female.  Presents to ER with concern for generalized weakness.  Patient reports ever since being discharged from hospital she feels that she has slowly been getting more weak, increased shortness of breath.  Also has increasing cough.  Nonproductive.  No fevers.  No falls.  No episodes of syncope.  Has had very poor appetite and poor energy level overall.  Breathing worse with any sort of exertion, very difficult time ambulating due to her general weakness.  HPI     Past Medical History:  Diagnosis Date   AMD (age related macular degeneration)    COPD (chronic obstructive pulmonary disease) (HCC)    Depression    Hiatal hernia    Intermittent low back pain    Memory impairment    Midsternal chest pain    a. 12/2011 Cardiac CTA Ca++ score of 103.3 (80th %), LAD <50p/m, RCA 50-75.   Osteoporosis    Vitamin B 12 deficiency     Patient Active Problem List   Diagnosis Date Noted   Atrial fibrillation (HCC) 10/11/2020   Pulmonary HTN (HCC) 10/11/2020   Tricuspid regurgitation 10/11/2020   HLD (hyperlipidemia) 10/11/2020   Hypotension 10/11/2020   Hypokalemia 10/11/2020   Chest pain at rest 10/05/2020   CAD (coronary artery disease) 04/24/2014   COPD (chronic obstructive pulmonary disease) (HCC) 04/24/2014    Past Surgical History:  Procedure Laterality Date   ABDOMINAL HYSTERECTOMY     RIGHT/LEFT HEART CATH AND CORONARY ANGIOGRAPHY N/A 10/07/2020   Procedure: RIGHT/LEFT HEART CATH AND CORONARY ANGIOGRAPHY;  Surgeon: Runell Gess, MD;  Location: [facility] INVASIVE CV LAB;  Service: Cardiovascular;  Laterality: N/A;     OB History   No obstetric history on file.     Family History  Problem Relation Age of Onset   Breast cancer Sister    Breast cancer Sister      Social History   Tobacco Use   Smoking status: Every Day    Packs/day: 1.00    Years: 50.00    Pack years: 50.00    Types: Cigarettes   Smokeless tobacco: Never  Substance Use Topics   Alcohol use: No    Alcohol/week: 0.0 standard drinks    Comment: rarely   Drug use: No    Home Medications Prior to Admission medications   Medication Sig Start Date End Date Taking? Authorizing Provider  albuterol (VENTOLIN HFA) 108 (90 Base) MCG/ACT inhaler Inhale 2 puffs into the lungs every 4 (four) hours as needed for wheezing. 06/08/20   [provider]  apixaban (ELIQUIS) 5 MG TABS tablet Take 1 tablet (5 mg total) by mouth 2 (two) times daily. 10/11/20   Filbert Schilder, NP  atorvastatin (LIPITOR) 40 MG tablet Take 1 tablet (40 mg total) by mouth daily. 10/12/20   Filbert Schilder, NP  citalopram (CELEXA) 20 MG tablet Take 20 mg by mouth at bedtime. 06/08/20   [provider]  cyanocobalamin (,VITAMIN B-12,) 1000 MCG/ML injection Inject 1,000 mcg into the muscle every 30 (thirty) days. 06/11/16   [provider]  digoxin (LANOXIN) 0.125 MG tablet Take 1 tablet (0.125 mg total) by mouth daily. 10/12/20   Filbert Schilder, NP  diltiazem (CARDIZEM CD) 180 MG 24 hr capsule Take  1 capsule (180 mg total) by mouth daily. 10/12/20   Georgie Chard D, NP  esomeprazole (NEXIUM) 40 MG capsule Take 40 mg by mouth daily.    [provider]  ibuprofen (ADVIL) 200 MG tablet Take 400 mg by mouth every 6 (six) hours as needed for headache.    [provider]  metoprolol tartrate (LOPRESSOR) 25 MG tablet Take 0.5 tablets (12.5 mg total) by mouth 2 (two) times daily. 10/11/20   Filbert Schilder, NP  nitroGLYCERIN (NITROSTAT) 0.4 MG SL tablet Place 1 tablet (0.4 mg total) under the tongue every 5 (five) minutes x 3 doses as needed for chest pain. 10/11/20   Filbert Schilder, NP  promethazine (PHENERGAN) 25 MG tablet Take 25 mg by mouth 3 (three) times daily as needed for nausea.  04/17/16   [provider]    Allergies    Codeine  Review of Systems   Review of Systems  Constitutional:  Positive for fatigue. Negative for chills and fever.  HENT:  Negative for ear pain and sore throat.   Eyes:  Negative for pain and visual disturbance.  Respiratory:  Positive for cough and shortness of breath.   Cardiovascular:  Negative for chest pain and palpitations.  Gastrointestinal:  Negative for abdominal pain and vomiting.  Genitourinary:  Negative for dysuria and hematuria.  Musculoskeletal:  Negative for arthralgias and back pain.  Skin:  Negative for color change and rash.  Neurological:  Positive for weakness. Negative for seizures and syncope.  All other systems reviewed and are negative.  Physical Exam Updated Vital Signs BP 130/76   Pulse 75   Temp (!) 97.4 F (36.3 C) (Oral)   Resp (!) 21   Ht 4\' 8"  (1.422 m)   Wt 42.6 kg   SpO2 97%   BMI 21.07 kg/m   Physical Exam Vitals and nursing note reviewed.  Constitutional:      General: She is not in acute distress.    Appearance: She is well-developed.  HENT:     Head: Normocephalic and atraumatic.  Eyes:     Conjunctiva/sclera: Conjunctivae normal.  Cardiovascular:     Rate and Rhythm: Normal rate and regular rhythm.     Heart sounds: No murmur heard. Pulmonary:     Comments: Mild tachypnea, not in distress, diminished breath sounds at base of right lung Abdominal:     Palpations: Abdomen is soft.     Tenderness: There is no abdominal tenderness.  Musculoskeletal:        General: No deformity or signs of injury.     Cervical back: Neck supple.  Skin:    General: Skin is warm and dry.  Neurological:     General: No focal deficit present.     Mental Status: She is alert.  Psychiatric:        Mood and Affect: Mood normal.    ED Results / Procedures / Treatments   Labs (all labs ordered are listed, but only abnormal results are displayed) Labs Reviewed  CBC WITH  DIFFERENTIAL/PLATELET - Abnormal; Notable for the following components:      Result Value   WBC 14.8 (*)    Platelets 558 (*)    Neutro Abs 13.1 (*)    Lymphs Abs 0.6 (*)    Abs Immature Granulocytes 0.10 (*)    All other components within normal limits  COMPREHENSIVE METABOLIC PANEL - Abnormal; Notable for the following components:   Sodium 130 (*)    Chloride 95 (*)  CO2 21 (*)    Glucose, Bld 145 (*)    BUN 25 (*)    Calcium 8.6 (*)    Albumin 2.8 (*)    AST 165 (*)    ALT 103 (*)    Alkaline Phosphatase 161 (*)    Total Bilirubin 1.8 (*)    All other components within normal limits  TROPONIN I (HIGH SENSITIVITY) - Abnormal; Notable for the following components:   Troponin I (High Sensitivity) 25 (*)    All other components within normal limits  RESP PANEL BY RT-PCR (FLU A&B, COVID) ARPGX2  BRAIN NATRIURETIC PEPTIDE    EKG EKG Interpretation  Date/Time:  Tuesday October 19 2020 21:09:10 EDT Ventricular Rate:  106 PR Interval:    QRS Duration: 90 QT Interval:  339 QTC Calculation: 451 R Axis:   54 Text Interpretation: Atrial fibrillation Abnormal T, consider ischemia, diffuse leads Confirmed by Marianna Fuss ([id number]) on 10/19/2020 9:27:38 PM  Radiology DG Chest Portable 1 View  Result Date: 10/19/2020 CLINICAL DATA:  Shortness of breath EXAM: PORTABLE CHEST 1 VIEW COMPARISON:  October 05, 2020. FINDINGS: Cardiomediastinal silhouette is partially obscured but appears unchanged. Aortic atherosclerosis. New large right pleural effusion with adjacent compressive atelectasis. Small left pleural effusion with adjacent compressive atelectasis. The visualized skeletal structures are unchanged. IMPRESSION: 1. New large right pleural effusion with adjacent compressive atelectasis. 2. New small left pleural effusion with adjacent compressive atelectasis. Electronically Signed   By: Maudry Mayhew MD   On: 10/19/2020 22:20    Procedures Procedures   Medications Ordered in  ED Medications - No data to display  ED Course  I have reviewed the triage vital signs and the nursing notes.  Pertinent labs & imaging results that were available during my care of the patient were reviewed by me and considered in my medical decision making (see chart for details).    MDM Rules/Calculators/A&P                          75 year old lady presented to ER with concern for generalized weakness.  On exam patient not in distress but did noted to have slight tachypnea and breath sounds decreased on right side.  Chest x-ray concerning for new large right pleural effusion with compressive atelectasis.  Suspect this is cause for her general weakness and shortness of breath.  Given her cough, leukocytosis, concern for possibility of infectious process, started antibiotics to cover possibility of underlying pneumonia.  Ordered CT chest to further evaluate.  Given the degree of the effusion and her symptoms, believe she would benefit from admission for further management, potentially may need IR consult for drainage depending on how CT looks.  Discussed case with Dr. Margo Aye who will admit for further management.    Final Clinical Impression(s) / ED Diagnoses Final diagnoses:  Weakness  Shortness of breath  Pleural effusion  Leukocytosis, unspecified type    Rx / DC Orders ED Discharge Orders     None        Milagros Loll, MD 10/19/20 2335

## 2020-10-19 NOTE — Care Management (Signed)
ED RNCM met with patient and daughter at bedside.  They reported patient being discharged home last week from [facility] inpatient since then patient hs been becoming increasingly weaker not eating or drinking. Hx of  new onset A-Fib, COPD. RNCM discussed possible HH services once patient is medically cleared for discharge home, both patient  and daughter are agreeable to the services. ED evaluation still in progress, ED CM explained that the [facility] team will continue to follow for  transitional care planning.

## 2020-10-19 NOTE — ED Triage Notes (Signed)
Pt bib ems c/o generalized weakness and malaise worsening over the past week. Pt d/c from hospital on 7/4 for new onset of afib. Pt not c/o any pain or sob. Hx of COPD.   BP: 126/62  HR: 80-120  Spo2: 89% RA

## 2020-10-20 ENCOUNTER — Inpatient Hospital Stay (HOSPITAL_COMMUNITY): Payer: Medicare Other

## 2020-10-20 ENCOUNTER — Other Ambulatory Visit: Payer: Self-pay

## 2020-10-20 DIAGNOSIS — F32A Depression, unspecified: Secondary | ICD-10-CM | POA: Diagnosis present

## 2020-10-20 DIAGNOSIS — I5033 Acute on chronic diastolic (congestive) heart failure: Secondary | ICD-10-CM | POA: Diagnosis present

## 2020-10-20 DIAGNOSIS — J90 Pleural effusion, not elsewhere classified: Secondary | ICD-10-CM | POA: Diagnosis not present

## 2020-10-20 HISTORY — PX: IR THORACENTESIS ASP PLEURAL SPACE W/IMG GUIDE: IMG5380

## 2020-10-20 LAB — COMPREHENSIVE METABOLIC PANEL
ALT: 101 U/L — ABNORMAL HIGH (ref 0–44)
AST: 154 U/L — ABNORMAL HIGH (ref 15–41)
Albumin: 2.7 g/dL — ABNORMAL LOW (ref 3.5–5.0)
Alkaline Phosphatase: 144 U/L — ABNORMAL HIGH (ref 38–126)
Anion gap: 12 (ref 5–15)
BUN: 24 mg/dL — ABNORMAL HIGH (ref 8–23)
CO2: 20 mmol/L — ABNORMAL LOW (ref 22–32)
Calcium: 8.6 mg/dL — ABNORMAL LOW (ref 8.9–10.3)
Chloride: 97 mmol/L — ABNORMAL LOW (ref 98–111)
Creatinine, Ser: 0.86 mg/dL (ref 0.44–1.00)
GFR, Estimated: >60 mL/min (ref >60)
Glucose, Bld: 147 mg/dL — ABNORMAL HIGH (ref 70–99)
Potassium: 4.5 mmol/L (ref 3.5–5.1)
Sodium: 129 mmol/L — ABNORMAL LOW (ref 135–145)
Total Bilirubin: 1.6 mg/dL — ABNORMAL HIGH (ref 0.3–1.2)
Total Protein: 6.5 g/dL (ref 6.5–8.1)

## 2020-10-20 LAB — BODY FLUID CELL COUNT WITH DIFFERENTIAL
Eos, Fluid: 0 %
Lymphs, Fluid: 57 %
Monocyte-Macrophage-Serous Fluid: 4 % — ABNORMAL LOW (ref 50–90)
Neutrophil Count, Fluid: 39 % — ABNORMAL HIGH (ref 0–25)
Total Nucleated Cell Count, Fluid: 371 cu mm (ref 0–1000)

## 2020-10-20 LAB — GLUCOSE, PLEURAL OR PERITONEAL FLUID: Glucose, Fluid: 135 mg/dL

## 2020-10-20 LAB — CBC
HCT: 39.5 % (ref 36.0–46.0)
Hemoglobin: 13.4 g/dL (ref 12.0–15.0)
MCH: 33 pg (ref 26.0–34.0)
MCHC: 33.9 g/dL (ref 30.0–36.0)
MCV: 97.3 fL (ref 80.0–100.0)
Platelets: 599 10*3/uL — ABNORMAL HIGH (ref 150–400)
RBC: 4.06 MIL/uL (ref 3.87–5.11)
RDW: 15 % (ref 11.5–15.5)
WBC: 14.4 10*3/uL — ABNORMAL HIGH (ref 4.0–10.5)
nRBC: 0 % (ref 0.0–0.2)

## 2020-10-20 LAB — PROCALCITONIN: Procalcitonin: <0.1 ng/mL

## 2020-10-20 LAB — LACTATE DEHYDROGENASE, PLEURAL OR PERITONEAL FLUID: LD, Fluid: 117 U/L — ABNORMAL HIGH (ref 3–23)

## 2020-10-20 LAB — PHOSPHORUS: Phosphorus: 3.8 mg/dL (ref 2.5–4.6)

## 2020-10-20 LAB — ALBUMIN, PLEURAL OR PERITONEAL FLUID: Albumin, Fluid: 1.5 g/dL

## 2020-10-20 LAB — MAGNESIUM: Magnesium: 2 mg/dL (ref 1.7–2.4)

## 2020-10-20 LAB — DIGOXIN LEVEL: Digoxin Level: 1.4 ng/mL (ref 0.8–2.0)

## 2020-10-20 LAB — TROPONIN I (HIGH SENSITIVITY): Troponin I (High Sensitivity): 30 ng/L — ABNORMAL HIGH

## 2020-10-20 MED ORDER — SODIUM CHLORIDE 0.9 % IV SOLN
1.0000 g | INTRAVENOUS | Status: DC
  Administered 2020-10-21: 1 g via INTRAVENOUS
  Filled 2020-10-20: qty 10

## 2020-10-20 MED ORDER — POLYETHYLENE GLYCOL 3350 17 G PO PACK: 17.0000 g | PACK | Freq: Every day | ORAL | Status: DC | PRN

## 2020-10-20 MED ORDER — ATORVASTATIN CALCIUM 40 MG PO TABS
40.0000 mg | ORAL_TABLET | Freq: Every day | ORAL | Status: DC
  Administered 2020-10-20: 40 mg via ORAL
  Filled 2020-10-20 (×3): qty 1

## 2020-10-20 MED ORDER — DIGOXIN 125 MCG PO TABS
0.1250 mg | ORAL_TABLET | Freq: Every day | ORAL | Status: DC
  Administered 2020-10-20: 0.125 mg via ORAL
  Filled 2020-10-20: qty 1

## 2020-10-20 MED ORDER — LIDOCAINE HCL (PF) 1 % IJ SOLN
INTRAMUSCULAR | Status: AC | PRN
  Administered 2020-10-20: 10 mL

## 2020-10-20 MED ORDER — MELATONIN 3 MG PO TABS
3.0000 mg | ORAL_TABLET | Freq: Every evening | ORAL | Status: DC | PRN
  Administered 2020-10-28: 3 mg via ORAL
  Filled 2020-10-20 (×4): qty 1

## 2020-10-20 MED ORDER — FUROSEMIDE 10 MG/ML IJ SOLN
20.0000 mg | Freq: Two times a day (BID) | INTRAMUSCULAR | Status: DC
  Administered 2020-10-20 – 2020-10-21 (×3): 20 mg via INTRAVENOUS
  Filled 2020-10-20 (×3): qty 2

## 2020-10-20 MED ORDER — ONDANSETRON HCL 4 MG/2ML IJ SOLN
4.0000 mg | Freq: Four times a day (QID) | INTRAMUSCULAR | Status: DC | PRN
  Administered 2020-10-20 – 2020-10-23 (×7): 4 mg via INTRAVENOUS
  Filled 2020-10-20 (×7): qty 2

## 2020-10-20 MED ORDER — DIGOXIN 125 MCG PO TABS
0.0625 mg | ORAL_TABLET | Freq: Every day | ORAL | Status: DC
  Administered 2020-10-21 – 2020-10-26 (×6): 0.0625 mg via ORAL
  Filled 2020-10-20 (×7): qty 1

## 2020-10-20 MED ORDER — LIDOCAINE HCL 1 % IJ SOLN
INTRAMUSCULAR | Status: AC
  Filled 2020-10-20: qty 20

## 2020-10-20 MED ORDER — METOPROLOL TARTRATE 5 MG/5ML IV SOLN
INTRAVENOUS | Status: AC
  Filled 2020-10-20: qty 5

## 2020-10-20 MED ORDER — METOPROLOL TARTRATE 12.5 MG HALF TABLET
12.5000 mg | ORAL_TABLET | Freq: Two times a day (BID) | ORAL | Status: DC
  Administered 2020-10-20 – 2020-10-23 (×9): 12.5 mg via ORAL
  Filled 2020-10-20 (×10): qty 1

## 2020-10-20 MED ORDER — PANTOPRAZOLE SODIUM 40 MG PO TBEC
40.0000 mg | DELAYED_RELEASE_TABLET | Freq: Every day | ORAL | Status: DC
  Administered 2020-10-20 – 2020-10-29 (×7): 40 mg via ORAL
  Filled 2020-10-20 (×4): qty 1
  Filled 2020-10-20: qty 2
  Filled 2020-10-20 (×5): qty 1

## 2020-10-20 MED ORDER — SODIUM CHLORIDE 0.9 % IV SOLN
500.0000 mg | INTRAVENOUS | Status: DC
  Administered 2020-10-21: 500 mg via INTRAVENOUS
  Filled 2020-10-20: qty 500

## 2020-10-20 MED ORDER — CITALOPRAM HYDROBROMIDE 20 MG PO TABS
20.0000 mg | ORAL_TABLET | Freq: Every day | ORAL | Status: DC
  Administered 2020-10-20: 20 mg via ORAL
  Filled 2020-10-20: qty 1
  Filled 2020-10-20: qty 2
  Filled 2020-10-20 (×9): qty 1

## 2020-10-20 MED ORDER — METOPROLOL TARTRATE 5 MG/5ML IV SOLN
5.0000 mg | Freq: Four times a day (QID) | INTRAVENOUS | Status: DC | PRN
  Administered 2020-10-20: 5 mg via INTRAVENOUS
  Filled 2020-10-20: qty 5

## 2020-10-20 MED ORDER — APIXABAN 5 MG PO TABS
5.0000 mg | ORAL_TABLET | Freq: Two times a day (BID) | ORAL | Status: DC
  Administered 2020-10-20 – 2020-10-23 (×8): 5 mg via ORAL
  Filled 2020-10-20 (×10): qty 1

## 2020-10-20 NOTE — H&P (Signed)
History and Physical  Laurie James Laurie James SAY:[id number] DOB: 04-06-1946 DOA: 10/19/2020  Referring physician: Dr. Cherrie Distance, EDP. PCP: Georgann Housekeeper, MD  Outpatient Specialists: Cardiology. Patient coming from: Home.  Chief Complaint: Shortness of breath, generalized weakness, poor appetite.  HPI: Laurie James is a 75 y.o. female with medical history significant for newly diagnosed A. fib, COPD, hypertension, hyperlipidemia, chronic anxiety/depression, GERD, who presented to [facility] ED from home due to worsening shortness of breath with minimal exertion.  Associated with generalized weakness, semi productive cough, chills, poor oral intake.  She presented to the ED for further evaluation.  Work-up in the ED revealed bilateral pleural effusions, right greater than left.  Leukocytosis with WBC greater than 14,000, started on IV antibiotics empirically Rocephin and azithromycin in the ED.  CT chest ordered without contrast by EDP to further evaluate large right pleural effusion.  TR[facility], hospitalist team, was asked to admit.  ED Course:  Temperature 97.4.  BP 116/78, pulse 114, respiration rate 21, O2 saturation 96% on room air.  Lab studies remarkable for serum sodium 130, serum bicarb 21, glucose 145, BUN 25, creatinine 0.96, anion gap 14, alkaline phosphatase 161, AST 165, ALT 103, total bilirubin 1.8, BNP 895, troponin 25.  WBC 14.8, hemoglobin 13.9, platelet count 558, neutrophil count 13.1.  Review of Systems: Review of systems as noted in the HPI. All other systems reviewed and are negative.   Past Medical History:  Diagnosis Date   AMD (age related macular degeneration)    COPD (chronic obstructive pulmonary disease) (HCC)    Depression    Hiatal hernia    Intermittent low back pain    Memory impairment    Midsternal chest pain    a. 12/2011 Cardiac CTA Ca++ score of 103.3 (80th %), LAD <50p/m, RCA 50-75.   Osteoporosis    Vitamin B 12 deficiency    Past Surgical  History:  Procedure Laterality Date   ABDOMINAL HYSTERECTOMY     RIGHT/LEFT HEART CATH AND CORONARY ANGIOGRAPHY N/A 10/07/2020   Procedure: RIGHT/LEFT HEART CATH AND CORONARY ANGIOGRAPHY;  Surgeon: Runell Gess, MD;  Location: [facility] INVASIVE CV LAB;  Service: Cardiovascular;  Laterality: N/A;    Social History:  reports that she has been smoking cigarettes. She has a 50.00 pack-year smoking history. She has never used smokeless tobacco. She reports that she does not drink alcohol and does not use drugs.   Allergies  Allergen Reactions   Codeine Nausea And Vomiting    Family History  Problem Relation Age of Onset   Breast cancer Sister    Breast cancer Sister       Prior to Admission medications   Medication Sig Start Date End Date Taking? Authorizing Provider  albuterol (VENTOLIN HFA) 108 (90 Base) MCG/ACT inhaler Inhale 2 puffs into the lungs every 4 (four) hours as needed for wheezing. 06/08/20  Yes [provider]  apixaban (ELIQUIS) 5 MG TABS tablet Take 1 tablet (5 mg total) by mouth 2 (two) times daily. 10/11/20  Yes Georgie Chard D, NP  atorvastatin (LIPITOR) 40 MG tablet Take 1 tablet (40 mg total) by mouth daily. 10/12/20  Yes Georgie Chard D, NP  citalopram (CELEXA) 20 MG tablet Take 20 mg by mouth at bedtime. 06/08/20  Yes [provider]  cyanocobalamin (,VITAMIN B-12,) 1000 MCG/ML injection Inject 1,000 mcg into the muscle every 30 (thirty) days. 06/11/16  Yes [provider]  digoxin (LANOXIN) 0.125 MG tablet Take 1 tablet (0.125 mg total) by  mouth daily. 10/12/20  Yes Georgie Chard D, NP  diltiazem (CARDIZEM CD) 180 MG 24 hr capsule Take 1 capsule (180 mg total) by mouth daily. 10/12/20  Yes Georgie Chard D, NP  esomeprazole (NEXIUM) 40 MG capsule Take 40 mg by mouth daily.   Yes [provider]  metoprolol tartrate (LOPRESSOR) 25 MG tablet Take 0.5 tablets (12.5 mg total) by mouth 2 (two) times daily. 10/11/20  Yes Georgie Chard D, NP   nitroGLYCERIN (NITROSTAT) 0.4 MG SL tablet Place 1 tablet (0.4 mg total) under the tongue every 5 (five) minutes x 3 doses as needed for chest pain. 10/11/20  Yes Georgie Chard D, NP  promethazine (PHENERGAN) 25 MG tablet Take 25 mg by mouth 3 (three) times daily as needed for nausea. 04/17/16  Yes [provider]    Physical Exam: BP 119/80   Pulse 72   Temp (!) 97.4 F (36.3 C) (Oral)   Resp (!) 29   Ht 4\' 8"  (1.422 m)   Wt 42.6 kg   SpO2 96%   BMI 21.07 kg/m   General: 75 y.o. year-old female well developed well nourished in no acute distress.  Alert and oriented x3. Cardiovascular: Irregular rate and rhythm with no rubs or gallops.  No thyromegaly or JVD noted.  No lower extremity edema. 2/4 pulses in all 4 extremities. Respiratory: Mild rales at bases bilaterally.  No wheezing noted.  Poor inspiratory effort. Abdomen: Soft nontender nondistended with normal bowel sounds x4 quadrants. Muskuloskeletal: No cyanosis, clubbing or edema noted bilaterally Neuro: CN II-XII intact, strength, sensation, reflexes Skin: No ulcerative lesions noted or rashes Psychiatry: Judgement and insight appear normal. Mood is appropriate for condition and setting          Labs on Admission:  Basic Metabolic Panel: Recent Labs  Lab 10/19/20 2115  NA 130*  K 4.5  CL 95*  CO2 21*  GLUCOSE 145*  BUN 25*  CREATININE 0.96  CALCIUM 8.6*   Liver Function Tests: Recent Labs  Lab 10/19/20 2115  AST 165*  ALT 103*  ALKPHOS 161*  BILITOT 1.8*  PROT 6.8  ALBUMIN 2.8*   No results for input(s): LIPASE, AMYLASE in the last 168 hours. No results for input(s): AMMONIA in the last 168 hours. CBC: Recent Labs  Lab 10/19/20 2115  WBC 14.8*  NEUTROABS 13.1*  HGB 13.9  HCT 41.5  MCV 97.9  PLT 558*   Cardiac Enzymes: No results for input(s): CKTOTAL, CKMB, CKMBINDEX, TROPONINI in the last 168 hours.  BNP (last 3 results) Recent Labs    10/05/20 1902 10/19/20 2115  BNP 733.2*  895.8*    ProBNP (last 3 results) No results for input(s): PROBNP in the last 8760 hours.  CBG: No results for input(s): GLUCAP in the last 168 hours.  Radiological Exams on Admission: DG Chest Portable 1 View  Result Date: 10/19/2020 CLINICAL DATA:  Shortness of breath EXAM: PORTABLE CHEST 1 VIEW COMPARISON:  October 05, 2020. FINDINGS: Cardiomediastinal silhouette is partially obscured but appears unchanged. Aortic atherosclerosis. New large right pleural effusion with adjacent compressive atelectasis. Small left pleural effusion with adjacent compressive atelectasis. The visualized skeletal structures are unchanged. IMPRESSION: 1. New large right pleural effusion with adjacent compressive atelectasis. 2. New small left pleural effusion with adjacent compressive atelectasis. Electronically Signed   By: October 07, 2020 MD   On: 10/19/2020 22:20    EKG: I independently viewed the EKG done and my findings are as followed: Atrial fibrillation with rate of 106.  Nonspecific ST-T changes.  QTc 451.  Assessment/Plan Present on Admission:  Bilateral pleural effusion  Active Problems:   Bilateral pleural effusion  Bilateral pleural effusions, right greater than left, suspect cardiogenic Presented with elevated BNP greater than 800 Last 2D echo was from 10/06/2020 which revealed LVEF 55 to 60% with grade 2 diastolic dysfunction, left atrial size severely dilated, right atrial size was also severely dilated.  Tricuspid valve regurgitation is severe.  Pulmonic valve regurgitation is moderate. CT chest without contrast ordered by EDP to further assess large right pleural effusion. IR consulted for possible right thoracentesis.  Acute on chronic diastolic CHF Baseline BNP was 409 on 10/05/2020 BNP 895 on 10/19/2020 Bilateral pleural effusions, right greater than left. Start strict I's and O's and daily weight Recent 2D echo as stated above, done on 10/06/2020. Defer to cardiology to start diuretics if  indicated.  Suspected community-acquired pneumonia Presented with dyspnea, similar productive cough, leukocytosis with WBC greater than 14,000 Started on Rocephin and azithromycin in the ED, continue Obtain procalcitonin level Monitor fever curve and WBC  Pulmonary hypertension/severe tricuspid regurgitation/right heart enlargement Heart cath showed mild nonobstructive coronary artery disease Please consult cardiology in the morning.  Recently diagnosed new onset A. Fib She is on Eliquis for CHA2DS2-VASc score 4 Continue Eliquis for primary CVA prevention. Resume home digoxin and home p.o. Lopressor 12.5 mg twice daily. Obtain digoxin level  Chronic anxiety/depression Resume home Celexa  GERD Resume home p.o. PPI  Hyperlipidemia Resume home Lipitor  Acute transaminitis, suspect hepatic congestion Presented with elevated alkaline phosphatase, AST ALT and T bilirubin. Resume CMP in the morning.  Generalized weakness PT OT to assess Fall precautions TOC to assist with DC planning.   DVT prophylaxis: Eliquis  Code Status: Full code  Family Communication: Daughter at bedside  Disposition Plan: Admit to telemetry cardiac  Consults called: Please consult cardiology in the morning.  Admission status: Inpatient status.  Patient will require at least 2 midnights for further evaluation and treatment of present condition.   Status is: Inpatient    Dispo:  Patient From: Home  Planned Disposition: Home with Health Care [facility], possibly on 10/21/2020 or when cardiology signs off.  Medically stable for discharge: No         Darlin Drop MD [facility] Pager [phone]  If 7PM-7AM, please contact night-coverage [email or web address] Password [email or web address]  10/20/2020, 12:00 AM

## 2020-10-20 NOTE — ED Notes (Signed)
Attempted to call report to floor 

## 2020-10-20 NOTE — Procedures (Signed)
PROCEDURE SUMMARY:  Successful US guided right thoracentesis. Yielded 1.7 L of amber-colored fluid. Pt tolerated procedure well. No immediate complications.  Specimen sent for labs. CXR ordered; no post-procedure pneumothorax identified   EBL < 2 mL  Mickie Kay, NP 10/20/2020 1:49 PM

## 2020-10-20 NOTE — Progress Notes (Signed)
Patient's bedside monitor alarming elevated HR, noted to be ranging from 100 to 140s, but increasingly alarming elevated HR.  Remains in afib.  BP 120/87. Dr. Rito Ehrlich notified, order for prn metoprolol IV for HR greater than 120bpm.

## 2020-10-20 NOTE — Progress Notes (Signed)
   10/20/20 1705  Assess: MEWS Score  BP 98/75  Pulse Rate (!) 106  ECG Heart Rate (!) 106  Resp 20  SpO2 92 %  Assess: MEWS Score  MEWS Temp 0  MEWS Systolic 1  MEWS Pulse 1  MEWS RR 0  MEWS LOC 0  MEWS Score 2  MEWS Score Color Yellow  Assess: if the MEWS score is Yellow or Red  Were vital signs taken at a resting state? Yes  Focused Assessment No change from prior assessment  Early Detection of Sepsis Score *See Row Information* Medium  MEWS guidelines implemented *See Row Information* Yes  Treat  MEWS Interventions Other (Comment)  Pain Scale 0-10  Pain Score 0  Take Vital Signs  Increase Vital Sign Frequency  Yellow: Q 2hr X 2 then Q 4hr X 2, if remains yellow, continue Q 4hrs  Escalate  MEWS: Escalate Yellow: discuss with charge nurse/RN and consider discussing with provider and RRT  Notify: Charge Nurse/RN  Name of Charge Nurse/RN Notified Christy RN  Date Charge Nurse/RN Notified 10/20/20  Time Charge Nurse/RN Notified 1725  Notify: Provider  Provider Name/Title Dr. Rito Ehrlich  Date Provider Notified 10/20/20  Time Provider Notified 1725  Notification Type Page  Notification Reason Other (Comment) (HR increasing to 140s)  Provider response Other (Comment) (see previous note for prn lopressor orders)  Date of Provider Response 10/20/20  Time of Provider Response 1726  Document  Patient Outcome Other (Comment) (remains stable)  Progress note created (see row info) Yes

## 2020-10-20 NOTE — Progress Notes (Signed)
Patient taken to IR via bed.  

## 2020-10-20 NOTE — Progress Notes (Signed)
PROGRESS NOTE  Laurie James Britteny Fiebelkorn ZSW:[id number] DOB: 11-Apr-1945 DOA: 10/19/2020 PCP: Georgann Housekeeper, MD  HPI/Recap of past 24 hours: 75 y.o. female with medical history significant for recent hospitalization and discharge a week ago with newly diagnosed A. fib, plus history of COPD, hypertension presented to the ED on 7/12 with weakness, shortness of breath, cough and found to have pneumonia ane bilateral pleural effusions.  Started on IV antibiotics and admitted to the hospitalist service. Associated with generalized weakness, semi productive cough, chills, poor oral intake.  Pt seen this morning, still in ED.  Still some cough and shortness of breath, although improved.  IR consulted for evaluation for thoracentesis.  Assessment/Plan: Principal Problem:   Bilateral pleural effusions/Acute on chronic diastolic CHF (congestive heart failure) (HCC): Echo from 6/29 notes grade 2 diastolic dysfunction.  Have started IV lasix. Active Problems:   CAD (coronary artery disease): Stable during this hospitalization.    COPD (chronic obstructive pulmonary disease) (HCC): breathing somewhat labored, but looks to be more from PNA and CHF.    Atrial fibrillation [facility]): Some rapid ventricular rate.  In part due to pleural effusions.  Should improve with diuresis.  On Eliquis.   Pulmonary HTN (HCC)   HLD (hyperlipidemia)      Depression: Stable, cont home meds    HCAP (healthcare-associated pneumonia): cont IV rocephin and Zithromax.  Procalcitonin ordered.  Sepsis is ruled out.  Her tachycardia could be attributed to the atrial fibrillation and the tachypnea from the pleural effusions, so not enough SIRS criteria.  Hyponatremia: likely dilutional in the context of acute heart failure Code Status: Full Code   Family Communication: Daughter at bedside   Disposition Plan: anticipate discharge once fully diursed, PNA treated, likely by 7/15-7/16   Consultants: Int Radiology    Procedures: Possible thoracentesis   Antimicrobials: IV Rocephin and Zithromax 7/13-present   DVT prophylaxis:  Eliquis  Level of care: Telemetry Cardiac   Objective: Vitals:   10/20/20 1000 10/20/20 1055  BP: (!) 138/110 107/75  Pulse: 68 (!) 113  Resp: 20 20  Temp:  (!) 97.3 F (36.3 C)  SpO2: 97% 98%    Intake/Output Summary (Last 24 hours) at 10/20/2020 1113 Last data filed at 10/20/2020 0504 Gross per 24 hour  Intake 219.57 ml  Output --  Net 219.57 ml   Filed Weights   10/19/20 2105 10/20/20 1055  Weight: 42.6 kg 45.9 kg   Body mass index is 22.67 kg/m.  Exam:  General: Alert and oriented x3, fatigued HEENT: Normocephalic, atraumatic, mucous membranes are slightly dry Cardiovascular: Irregular rhythm, tachycardic Respiratory: Bilateral rhonchi Abdomen: Soft, nontender, nondistended, positive bowel sounds Musculoskeletal: No clubbing or cyanosis, bilateral 1+ pitting edema Skin: Tanned skin, no skin breaks, tears.  Noted lesion on nose suspicious for basal cell carcinoma Psychiatry: Appropriate, no evidence of psychoses Neurology: No focal deficits   Data Reviewed: CBC: Recent Labs  Lab 10/19/20 2115 10/20/20 0331  WBC 14.8* 14.4*  NEUTROABS 13.1*  --   HGB 13.9 13.4  HCT 41.5 39.5  MCV 97.9 97.3  PLT 558* 599*   Basic Metabolic Panel: Recent Labs  Lab 10/19/20 2115 10/20/20 0331  NA 130* 129*  K 4.5 4.5  CL 95* 97*  CO2 21* 20*  GLUCOSE 145* 147*  BUN 25* 24*  CREATININE 0.96 0.86  CALCIUM 8.6* 8.6*  MG  --  2.0  PHOS  --  3.8   GFR: Estimated Creatinine Clearance: 35.8 mL/min (by C-G formula based on SCr  of 0.86 mg/dL). Liver Function Tests: Recent Labs  Lab 10/19/20 2115 10/20/20 0331  AST 165* 154*  ALT 103* 101*  ALKPHOS 161* 144*  BILITOT 1.8* 1.6*  PROT 6.8 6.5  ALBUMIN 2.8* 2.7*   No results for input(s): LIPASE, AMYLASE in the last 168 hours. No results for input(s): AMMONIA in the last 168  hours. Coagulation Profile: No results for input(s): INR, PROTIME in the last 168 hours. Cardiac Enzymes: No results for input(s): CKTOTAL, CKMB, CKMBINDEX, TROPONINI in the last 168 hours. BNP (last 3 results) No results for input(s): PROBNP in the last 8760 hours. HbA1C: No results for input(s): HGBA1C in the last 72 hours. CBG: No results for input(s): GLUCAP in the last 168 hours. Lipid Profile: No results for input(s): CHOL, HDL, LDLCALC, TRIG, CHOLHDL, LDLDIRECT in the last 72 hours. Thyroid Function Tests: No results for input(s): TSH, T4TOTAL, FREET4, T3FREE, THYROIDAB in the last 72 hours. Anemia Panel: No results for input(s): VITAMINB12, FOLATE, FERRITIN, TIBC, IRON, RETICCTPCT in the last 72 hours. Urine analysis:    Component Value Date/Time   COLORURINE YELLOW 10/06/2020 0552   APPEARANCEUR HAZY (A) 10/06/2020 0552   LABSPEC 1.036 (H) 10/06/2020 0552   PHURINE 5.0 10/06/2020 0552   GLUCOSEU NEGATIVE 10/06/2020 0552   HGBUR SMALL (A) 10/06/2020 0552   BILIRUBINUR NEGATIVE 10/06/2020 0552   KETONESUR NEGATIVE 10/06/2020 0552   PROTEINUR 30 (A) 10/06/2020 0552   NITRITE NEGATIVE 10/06/2020 0552   LEUKOCYTESUR NEGATIVE 10/06/2020 0552   Sepsis Labs: @LABRCNTIP (procalcitonin:4,lacticidven:4)  ) Recent Results (from the past 240 hour(s))  Resp Panel by RT-PCR (Flu A&B, Covid) Nasopharyngeal Swab     Status: None   Collection Time: 10/19/20  9:37 PM   Specimen: Nasopharyngeal Swab; Nasopharyngeal(NP) swabs in vial transport medium  Result Value Ref Range Status   SARS Coronavirus 2 by RT PCR NEGATIVE NEGATIVE Final    Comment: (NOTE) SARS-CoV-2 target nucleic acids are NOT DETECTED.  The SARS-CoV-2 RNA is generally detectable in upper respiratory specimens during the acute phase of infection. The lowest concentration of SARS-CoV-2 viral copies this assay can detect is 138 copies/mL. A negative result does not preclude SARS-Cov-2 infection and should not be used  as the sole basis for treatment or other patient management decisions. A negative result may occur with  improper specimen collection/handling, submission of specimen other than nasopharyngeal swab, presence of viral mutation(s) within the areas targeted by this assay, and inadequate number of viral copies(<138 copies/mL). A negative result must be combined with clinical observations, patient history, and epidemiological information. The expected result is Negative.  Fact Sheet for Patients:  12/20/20  Fact Sheet for Healthcare Providers:  BloggerCourse.com  This test is no t yet approved or cleared by the [place] FDA and  has been authorized for detection and/or diagnosis of SARS-CoV-2 by FDA under an Emergency Use Authorization (EUA). This EUA will remain  in effect (meaning this test can be used) for the duration of the COVID-19 declaration under Section 564(b)(1) of the Act, 21 U.S.C.section 360bbb-3(b)(1), unless the authorization is terminated  or revoked sooner.       Influenza A by PCR NEGATIVE NEGATIVE Final   Influenza B by PCR NEGATIVE NEGATIVE Final    Comment: (NOTE) The Xpert Xpress SARS-CoV-2/FLU/RSV plus assay is intended as an aid in the diagnosis of influenza from Nasopharyngeal swab specimens and should not be used as a sole basis for treatment. Nasal washings and aspirates are unacceptable for Xpert Xpress SARS-CoV-2/FLU/RSV testing.  Fact Sheet for Patients: BloggerCourse.com  Fact Sheet for Healthcare Providers: SeriousBroker.it  This test is not yet approved or cleared by the [place] FDA and has been authorized for detection and/or diagnosis of SARS-CoV-2 by FDA under an Emergency Use Authorization (EUA). This EUA will remain in effect (meaning this test can be used) for the duration of the COVID-19 declaration under Section 564(b)(1)  of the Act, 21 U.S.C. section 360bbb-3(b)(1), unless the authorization is terminated or revoked.  Performed at [facility], [address] [place], [place] [address]       Studies: CT Chest Wo Contrast  Result Date: 10/20/2020 CLINICAL DATA:  Chest pain and shortness of breath EXAM: CT CHEST WITHOUT CONTRAST TECHNIQUE: Multidetector CT imaging of the chest was performed following the standard protocol without IV contrast. COMPARISON:  Chest x-ray from the previous day, CT from 10/05/2020 FINDINGS: Cardiovascular: Somewhat limited due to lack of IV contrast. Diffuse aortic calcifications are noted without aneurysmal dilatation. Coronary calcifications are seen. Small pericardial effusion is noted new from the prior study. Mediastinum/Nodes: Esophagus as visualized is within normal limits. No sizable hilar or mediastinal adenopathy is noted. The thoracic inlet is within normal limits. Lungs/Pleura: Consolidation in the right lower lobe is noted with associated large pleural effusion. Small left-sided effusion is noted as well. Diffuse emphysematous changes are seen. Apical scarring is again identified and stable. No sizable parenchymal nodule is seen. Upper Abdomen: No acute abnormality in the upper abdomen is seen. Musculoskeletal: Degenerative changes of the thoracic spine are noted. No acute bony abnormality is seen. IMPRESSION: Right lower lobe infiltrate with associated large effusion. Small left effusion. Biapical pleural and parenchymal scarring stable from previous exam. Aortic Atherosclerosis (ICD10-I70.0) and Emphysema (ICD10-J43.9). Electronically Signed   By: Alcide Clever M.D.   On: 10/20/2020 02:18   DG Chest Portable 1 View  Result Date: 10/19/2020 CLINICAL DATA:  Shortness of breath EXAM: PORTABLE CHEST 1 VIEW COMPARISON:  October 05, 2020. FINDINGS: Cardiomediastinal silhouette is partially obscured but appears unchanged. Aortic atherosclerosis. New large right pleural effusion  with adjacent compressive atelectasis. Small left pleural effusion with adjacent compressive atelectasis. The visualized skeletal structures are unchanged. IMPRESSION: 1. New large right pleural effusion with adjacent compressive atelectasis. 2. New small left pleural effusion with adjacent compressive atelectasis. Electronically Signed   By: Maudry Mayhew MD   On: 10/19/2020 22:20    Scheduled Meds:  apixaban  5 mg Oral BID   atorvastatin  40 mg Oral Daily   citalopram  20 mg Oral QHS   digoxin  0.125 mg Oral Daily   furosemide  20 mg Intravenous BID   metoprolol tartrate  12.5 mg Oral BID   pantoprazole  40 mg Oral Daily    Continuous Infusions:   LOS: 1 day     Hollice Espy, MD [facility]   10/20/2020, 11:13 AM

## 2020-10-20 NOTE — Progress Notes (Signed)
Patient returned from IR, bandaid to right upper back with small amount of bloody drainage.

## 2020-10-20 NOTE — Progress Notes (Signed)
PT Cancellation Note  Patient Details Name: Laurie James MRN: [id number] DOB: [birth year]   Cancelled Treatment:    Reason Eval/Treat Not Completed: Medical issues which prohibited therapy. Patient declines ambulation right now due to nausea. States she has been nauseated all day. Will re-attempt later or tomorrow as time allows.    Caira Poche 10/20/2020, 1:38 PM

## 2020-10-20 NOTE — ED Notes (Signed)
Report given to Jaclyn Shaggy, RN of [phone]

## 2020-10-21 ENCOUNTER — Telehealth (HOSPITAL_COMMUNITY): Payer: Self-pay | Admitting: Cardiovascular Disease

## 2020-10-21 ENCOUNTER — Inpatient Hospital Stay (HOSPITAL_COMMUNITY): Payer: Medicare Other

## 2020-10-21 ENCOUNTER — Ambulatory Visit (HOSPITAL_COMMUNITY): Payer: Medicare Other | Admitting: Physician Assistant

## 2020-10-21 DIAGNOSIS — J90 Pleural effusion, not elsewhere classified: Secondary | ICD-10-CM | POA: Diagnosis not present

## 2020-10-21 DIAGNOSIS — I4821 Permanent atrial fibrillation: Secondary | ICD-10-CM | POA: Diagnosis not present

## 2020-10-21 DIAGNOSIS — I5033 Acute on chronic diastolic (congestive) heart failure: Secondary | ICD-10-CM | POA: Diagnosis not present

## 2020-10-21 DIAGNOSIS — R110 Nausea: Secondary | ICD-10-CM

## 2020-10-21 LAB — COMPREHENSIVE METABOLIC PANEL
ALT: 133 U/L — ABNORMAL HIGH (ref 0–44)
AST: 197 U/L — ABNORMAL HIGH (ref 15–41)
Albumin: 2.4 g/dL — ABNORMAL LOW (ref 3.5–5.0)
Alkaline Phosphatase: 151 U/L — ABNORMAL HIGH (ref 38–126)
Anion gap: 11 (ref 5–15)
BUN: 32 mg/dL — ABNORMAL HIGH (ref 8–23)
CO2: 23 mmol/L (ref 22–32)
Calcium: 8.5 mg/dL — ABNORMAL LOW (ref 8.9–10.3)
Chloride: 94 mmol/L — ABNORMAL LOW (ref 98–111)
Creatinine, Ser: 1.15 mg/dL — ABNORMAL HIGH (ref 0.44–1.00)
GFR, Estimated: 50 mL/min — ABNORMAL LOW (ref >60)
Glucose, Bld: 133 mg/dL — ABNORMAL HIGH (ref 70–99)
Potassium: 4.3 mmol/L (ref 3.5–5.1)
Sodium: 128 mmol/L — ABNORMAL LOW (ref 135–145)
Total Bilirubin: 1.5 mg/dL — ABNORMAL HIGH (ref 0.3–1.2)
Total Protein: 6 g/dL — ABNORMAL LOW (ref 6.5–8.1)

## 2020-10-21 LAB — CBC
HCT: 39.3 % (ref 36.0–46.0)
Hemoglobin: 13.8 g/dL (ref 12.0–15.0)
MCH: 33.5 pg (ref 26.0–34.0)
MCHC: 35.1 g/dL (ref 30.0–36.0)
MCV: 95.4 fL (ref 80.0–100.0)
Platelets: 615 10*3/uL — ABNORMAL HIGH (ref 150–400)
RBC: 4.12 MIL/uL (ref 3.87–5.11)
RDW: 15.1 % (ref 11.5–15.5)
WBC: 18.9 10*3/uL — ABNORMAL HIGH (ref 4.0–10.5)
nRBC: 0 % (ref 0.0–0.2)

## 2020-10-21 LAB — GLUCOSE, CAPILLARY: Glucose-Capillary: 138 mg/dL — ABNORMAL HIGH (ref 70–99)

## 2020-10-21 LAB — PH, BODY FLUID: pH, Body Fluid: 7.6

## 2020-10-21 MED ORDER — SODIUM CHLORIDE 0.9 % IV SOLN
6.2500 mg | Freq: Four times a day (QID) | INTRAVENOUS | Status: DC | PRN
  Administered 2020-10-21 – 2020-10-22 (×2): 6.25 mg via INTRAVENOUS
  Filled 2020-10-21 (×2): qty 0.25

## 2020-10-21 MED ORDER — SODIUM CHLORIDE 0.9 % IV SOLN: INTRAVENOUS | Status: DC

## 2020-10-21 NOTE — Plan of Care (Signed)
  Problem: Education: Goal: Knowledge of General Education information will improve Description Including pain rating scale, medication(s)/side effects and non-pharmacologic comfort measures Outcome: Progressing   

## 2020-10-21 NOTE — Progress Notes (Signed)
PT Cancellation Note  Patient Details Name: Chidera Dearcos MRN: [id number] DOB: 08/31/1945   Cancelled Treatment:    Reason Eval/Treat Not Completed: Medical issues which prohibited therapy.  Pt is describing nausea, nursing is aware and working on managing it for pt.  Follow up as time and pt allow.   Ivar Drape 10/21/2020, 11:19 AM  Samul Dada, PT MS Acute Rehab Dept. Number: [facility] [id number] and [facility] [phone]

## 2020-10-21 NOTE — Progress Notes (Signed)
PROGRESS NOTE  Laurie James [id number]:[id number] DOB: [birth year] DOA: 10/19/2020 PCP: Georgann Housekeeper, MD  HPI/Recap of past 24 hours: 75 y.o. female with medical history significant for recent hospitalization and discharge a week ago with newly diagnosed A. fib, plus history of COPD, hypertension presented to the ED on 7/12 with weakness, shortness of breath, cough and found to have pneumonia ane bilateral pleural effusions.  Started on IV antibiotics and admitted to the hospitalist service. Associated with generalized weakness, semi productive cough, chills, poor oral intake.  Following admission, interventional radiology removed 1.7 L of fluid from right lung by thoracentesis.  Patient's breathing improved.  Overnight no issues, but since early this morning, patient complaining of moderate nausea.  Denies any pain.  Blood pressures have been low.  Procalcitonin level normal and antibiotics discontinued.  Assessment/Plan: Principal Problem:   Bilateral pleural effusions/Acute on chronic diastolic CHF (congestive heart failure) (HCC): Echo from 6/29 notes grade 2 diastolic dysfunction.  Status postthoracentesis with 1.7 L removed Active Problems:   CAD (coronary artery disease): Stable during this hospitalization.    COPD (chronic obstructive pulmonary disease) (HCC): breathing somewhat labored, but looks to be more from CHF.    Atrial fibrillation [facility]): Some rapid ventricular rate.  A little hypotensive so hopefully with some IV fluids, this will help.  Nausea: Minimal relief with Zofran.  Abdominal x-ray unrevealing.  Suspect may be secondary to hypotension.  Patient put on some gentle fluids.    Pulmonary HTN (HCC)   HLD (hyperlipidemia)      Depression: Stable, cont home meds    HCAP (healthcare-associated pneumonia): cont IV rocephin and Zithromax.  Sepsis ruled out.  Procalcitonin level normal and antibiotics discontinued.  Hyponatremia: likely dilutional in the context  of acute heart failure Code Status: Full Code   Family Communication: Daughter at bedside   Disposition Plan: anticipate discharge once nausea relieved and atrial fibrillation controlled, possibly   Consultants: Int Radiology   Procedures: Possible thoracentesis   Antimicrobials: IV Rocephin and Zithromax 7/13-7/14  DVT prophylaxis:  Eliquis  Level of care: Telemetry Cardiac   Objective: Vitals:   10/21/20 1033 10/21/20 1215  BP: 102/77 109/76  Pulse: (!) 109 83  Resp:  16  Temp:  97.7 F (36.5 C)  SpO2:  97%    Intake/Output Summary (Last 24 hours) at 10/21/2020 1544 Last data filed at 10/20/2020 1800 Gross per 24 hour  Intake 50 ml  Output 100 ml  Net -50 ml    Filed Weights   10/19/20 2105 10/20/20 1055 10/21/20 0413  Weight: 42.6 kg 45.9 kg 48 kg   Body mass index is 23.72 kg/m.  Exam:  General: Alert and oriented x3, nauseated HEENT: Normocephalic, atraumatic, mucous membranes are slightly dry Cardiovascular: Irregular rhythm, tachycardic Respiratory: Decreased breath sounds bibasilar Abdomen: Soft, nontender, nondistended, few bowel sounds Musculoskeletal: No clubbing or cyanosis or edema Skin: Tanned skin, no skin breaks, tears.  Noted lesion on nose suspicious for basal cell carcinoma Psychiatry: Appropriate, no evidence of psychoses Neurology: No focal deficits   Data Reviewed: CBC: Recent Labs  Lab 10/19/20 2115 10/20/20 0331 10/21/20 0129  WBC 14.8* 14.4* 18.9*  NEUTROABS 13.1*  --   --   HGB 13.9 13.4 13.8  HCT 41.5 39.5 39.3  MCV 97.9 97.3 95.4  PLT 558* 599* 615*    Basic Metabolic Panel: Recent Labs  Lab 10/19/20 2115 10/20/20 0331 10/21/20 0129  NA 130* 129* 128*  K 4.5 4.5 4.3  CL 95*  97* 94*  CO2 21* 20* 23  GLUCOSE 145* 147* 133*  BUN 25* 24* 32*  CREATININE 0.96 0.86 1.15*  CALCIUM 8.6* 8.6* 8.5*  MG  --  2.0  --   PHOS  --  3.8  --     GFR: Estimated Creatinine Clearance: 27.4 mL/min (A) (by C-G formula  based on SCr of 1.15 mg/dL (H)). Liver Function Tests: Recent Labs  Lab 10/19/20 2115 10/20/20 0331 10/21/20 0129  AST 165* 154* 197*  ALT 103* 101* 133*  ALKPHOS 161* 144* 151*  BILITOT 1.8* 1.6* 1.5*  PROT 6.8 6.5 6.0*  ALBUMIN 2.8* 2.7* 2.4*    No results for input(s): LIPASE, AMYLASE in the last 168 hours. No results for input(s): AMMONIA in the last 168 hours. Coagulation Profile: No results for input(s): INR, PROTIME in the last 168 hours. Cardiac Enzymes: No results for input(s): CKTOTAL, CKMB, CKMBINDEX, TROPONINI in the last 168 hours. BNP (last 3 results) No results for input(s): PROBNP in the last 8760 hours. HbA1C: No results for input(s): HGBA1C in the last 72 hours. CBG: Recent Labs  Lab 10/21/20 1541  GLUCAP 138*   Lipid Profile: No results for input(s): CHOL, HDL, LDLCALC, TRIG, CHOLHDL, LDLDIRECT in the last 72 hours. Thyroid Function Tests: No results for input(s): TSH, T4TOTAL, FREET4, T3FREE, THYROIDAB in the last 72 hours. Anemia Panel: No results for input(s): VITAMINB12, FOLATE, FERRITIN, TIBC, IRON, RETICCTPCT in the last 72 hours. Urine analysis:    Component Value Date/Time   COLORURINE YELLOW 10/06/2020 0552   APPEARANCEUR HAZY (A) 10/06/2020 0552   LABSPEC 1.036 (H) 10/06/2020 0552   PHURINE 5.0 10/06/2020 0552   GLUCOSEU NEGATIVE 10/06/2020 0552   HGBUR SMALL (A) 10/06/2020 0552   BILIRUBINUR NEGATIVE 10/06/2020 0552   KETONESUR NEGATIVE 10/06/2020 0552   PROTEINUR 30 (A) 10/06/2020 0552   NITRITE NEGATIVE 10/06/2020 0552   LEUKOCYTESUR NEGATIVE 10/06/2020 0552   Sepsis Labs: @LABRCNTIP (procalcitonin:4,lacticidven:4)  ) Recent Results (from the past 240 hour(s))  Resp Panel by RT-PCR (Flu A&B, Covid) Nasopharyngeal Swab     Status: None   Collection Time: 10/19/20  9:37 PM   Specimen: Nasopharyngeal Swab; Nasopharyngeal(NP) swabs in vial transport medium  Result Value Ref Range Status   SARS Coronavirus 2 by RT PCR NEGATIVE  NEGATIVE Final    Comment: (NOTE) SARS-CoV-2 target nucleic acids are NOT DETECTED.  The SARS-CoV-2 RNA is generally detectable in upper respiratory specimens during the acute phase of infection. The lowest concentration of SARS-CoV-2 viral copies this assay can detect is 138 copies/mL. A negative result does not preclude SARS-Cov-2 infection and should not be used as the sole basis for treatment or other patient management decisions. A negative result may occur with  improper specimen collection/handling, submission of specimen other than nasopharyngeal swab, presence of viral mutation(s) within the areas targeted by this assay, and inadequate number of viral copies(<138 copies/mL). A negative result must be combined with clinical observations, patient history, and epidemiological information. The expected result is Negative.  Fact Sheet for Patients:  12/20/20  Fact Sheet for Healthcare Providers:  BloggerCourse.com  This test is no t yet approved or cleared by the [employer] FDA and  has been authorized for detection and/or diagnosis of SARS-CoV-2 by FDA under an Emergency Use Authorization (EUA). This EUA will remain  in effect (meaning this test can be used) for the duration of the COVID-19 declaration under Section 564(b)(1) of the Act, 21 U.S.C.section 360bbb-3(b)(1), unless the authorization is terminated  or  revoked sooner.       Influenza A by PCR NEGATIVE NEGATIVE Final   Influenza B by PCR NEGATIVE NEGATIVE Final    Comment: (NOTE) The Xpert Xpress SARS-CoV-2/FLU/RSV plus assay is intended as an aid in the diagnosis of influenza from Nasopharyngeal swab specimens and should not be used as a sole basis for treatment. Nasal washings and aspirates are unacceptable for Xpert Xpress SARS-CoV-2/FLU/RSV testing.  Fact Sheet for Patients: BloggerCourse.com  Fact Sheet for Healthcare  Providers: SeriousBroker.it  This test is not yet approved or cleared by the [place] FDA and has been authorized for detection and/or diagnosis of SARS-CoV-2 by FDA under an Emergency Use Authorization (EUA). This EUA will remain in effect (meaning this test can be used) for the duration of the COVID-19 declaration under Section 564(b)(1) of the Act, 21 U.S.C. section 360bbb-3(b)(1), unless the authorization is terminated or revoked.  Performed at [facility], [address] [place], [place] [address]   Body fluid culture w Gram Stain     Status: None (Preliminary result)   Collection Time: 10/20/20  1:09 PM   Specimen: Lung, Right; Pleural Fluid  Result Value Ref Range Status   Specimen Description PLEURAL FLUID  Final   Special Requests LUNG RIGHT SPEC A  Final   Gram Stain   Final    FEW WBC PRESENT, PREDOMINANTLY MONONUCLEAR NO ORGANISMS SEEN    Culture   Final    NO GROWTH < 24 HOURS Performed at [facility], [address] [place], [place] [address]    Report Status PENDING  Incomplete      Studies: DG Abd Portable 1V  Result Date: 10/21/2020 CLINICAL DATA:  Ileus EXAM: PORTABLE ABDOMEN - 1 VIEW COMPARISON:  None. FINDINGS: Interstitial and patchy airspace disease in the right lung base with small bilateral pleural effusions noted. Mild gaseous distention of the stomach. There is mild diffuse gaseous distention of small bowel and colon in a nonobstructive pattern. Thoracolumbar scoliosis evident. Telemetry leads overlie the chest. IMPRESSION: 1. Nonobstructive bowel gas pattern with mild gaseous distention of the stomach and colon. Component of mild ileus not excluded. 2. Patchy interstitial and airspace disease at the right lung base with small bilateral pleural effusions. Electronically Signed   By: Kennith Center M.D.   On: 10/21/2020 13:51    Scheduled Meds:  apixaban  5 mg Oral BID   atorvastatin  40 mg Oral Daily    citalopram  20 mg Oral QHS   digoxin  0.0625 mg Oral Daily   metoprolol tartrate  12.5 mg Oral BID   pantoprazole  40 mg Oral Daily    Continuous Infusions:  sodium chloride 75 mL/hr at 10/21/20 1125   promethazine (PHENERGAN) injection (IM or IVPB) 6.25 mg (10/21/20 1233)     LOS: 2 days     Hollice Espy, MD [facility]   10/21/2020, 3:44 PM

## 2020-10-21 NOTE — Progress Notes (Addendum)
Pt's daughter felt like pt was not herself. Pt able to answer questions correctly. A/O X4. Denies pain, CBG 138. VSS 97.5 113/88  97RA HR 100-120. [place] does not recall pt voiding, bladder san revealed 67 mL. Will page MD on call per family request.   MD on call feels that these symptoms are related to hypotension earlier today. Will continue to monitor and update the family.

## 2020-10-21 NOTE — Telephone Encounter (Signed)
Just an FYI. We have made several attempts to contact this patient including sending a letter to schedule or reschedule their echocardiogram. We will be removing the patient from the echo WQ.  10/15/20 NO SHOWED-MAILED LETTER [facility]          Thank you  

## 2020-10-21 NOTE — Evaluation (Signed)
Occupational Therapy Evaluation Patient Details Name: Laurie James MRN: [id number] DOB: Sep 03, 1945 Today's Date: 10/21/2020    History of Present Illness 75 y.o. F admitted to [facility] due to worsening SOB and increased weakness. Her prior medical history is significant for newly diagnosed A. fib, COPD, hypertension, hyperlipidemia, chronic anxiety/depression, GERD.   Clinical Impression   Pt admitted for concerns listed above. PTA pt reported that she was independent with all ADL's and IADL's, using no AD. At this time, pt presents with increased weakness and decreased activity tolerance. She is requiring min guard to min A for ADL's and functional mobility. In addition, pt was very limited by nausea this session, however reporting that she wants to try to move more so that she can go home instead of to a SNF. Acute OT will continue to follow to address deficits listed below.     Follow Up Recommendations  Home health OT;Supervision/Assistance - 24 hour    Equipment Recommendations  Other (comment) (RW)    Recommendations for Other Services       Precautions / Restrictions Precautions Precautions: Fall Precaution Comments: Watch HR Restrictions Weight Bearing Restrictions: No      Mobility Bed Mobility Overal bed mobility: Needs Assistance Bed Mobility: Rolling;Sit to Supine;Supine to Sit Rolling: Supervision   Supine to sit: Min assist Sit to supine: Min guard   General bed mobility comments: Min A to come to sitting to assist with elevating trunk    Transfers Overall transfer level: Needs assistance Equipment used: 1 person hand held assist Transfers: Sit to/from [employer] Sit to Stand: Min assist Stand pivot transfers: Min assist       General transfer comment: Min A to power up and to steady    Balance Overall balance assessment: Needs assistance Sitting-balance support: Single extremity supported;Feet supported Sitting  balance-Leahy Scale: Fair     Standing balance support: Single extremity supported Standing balance-Leahy Scale: Poor                             ADL either performed or assessed with clinical judgement   ADL Overall ADL's : Needs assistance/impaired Eating/Feeding: Set up;Sitting   Grooming: Set up;Sitting   Upper Body Bathing: Min guard;Sitting   Lower Body Bathing: Minimal assistance;Moderate assistance;Sitting/lateral leans;Sit to/from stand   Upper Body Dressing : Min guard;Sitting   Lower Body Dressing: Minimal assistance;Sit to/from stand;Sitting/lateral leans   Toilet Transfer: Minimal assistance;Stand-pivot   Toileting- Clothing Manipulation and Hygiene: Minimal assistance;Sitting/lateral lean   Tub/ Shower Transfer: Minimal assistance;Stand-pivot   Functional mobility during ADLs: Minimal assistance General ADL Comments: Pt presenting with decreased activity tolerance and increased nausea, unable to tolerate OOB for more than a minute     Vision Baseline Vision/History: No visual deficits Patient Visual Report: No change from baseline Vision Assessment?: No apparent visual deficits     Perception Perception Perception Tested?: No   Praxis Praxis Praxis tested?: Not tested    Pertinent Vitals/Pain Pain Assessment: No/denies pain     Hand Dominance Right   Extremity/Trunk Assessment Upper Extremity Assessment Upper Extremity Assessment: Generalized weakness   Lower Extremity Assessment Lower Extremity Assessment: Defer to PT evaluation   Cervical / Trunk Assessment Cervical / Trunk Assessment: Kyphotic   Communication Communication Communication: No difficulties   Cognition Arousal/Alertness: Awake/alert Behavior During Therapy: WFL for tasks assessed/performed Overall Cognitive Status: Within Functional Limits for tasks assessed  General Comments  HR in Afib 120's-150's     Exercises     Shoulder Instructions      Home Living Family/patient expects to be discharged to:: Private residence Living Arrangements: Spouse/significant other Available Help at Discharge: Family;Available 24 hours/day Type of Home: House Home Access: Stairs to enter [employer] of Steps: 3 steps Entrance Stairs-Rails: None Home Layout: Two level Alternate Level Stairs-Number of Steps: 1 flight Alternate Level Stairs-Rails: Left Bathroom Shower/Tub: Producer, television/film/video: Standard Bathroom Accessibility: No   Home Equipment: Cane - single point          Prior Functioning/Environment Level of Independence: Independent                 OT Problem List: Decreased strength;Decreased activity tolerance;Impaired balance (sitting and/or standing);Decreased coordination;Decreased safety awareness;Decreased knowledge of use of DME or AE;Cardiopulmonary status limiting activity;Other (comment) (Nausea)      OT Treatment/Interventions: Self-care/ADL training;Therapeutic exercise;Energy conservation;DME and/or AE instruction;Therapeutic activities;Patient/family education;Balance training    OT Goals(Current goals can be found in the care plan section) Acute Rehab OT Goals Patient Stated Goal: To feel better OT Goal Formulation: With patient/family Time For Goal Achievement: 11/04/20 Potential to Achieve Goals: Good ADL Goals Pt Will Perform Grooming: with modified independence;standing Pt Will Transfer to Toilet: with supervision;ambulating Pt/caregiver will Perform Home Exercise Program: Increased strength;Both right and left upper extremity;Independently;With written HEP provided Additional ADL Goal #1: Pt will tolerate OOB activities for 5 mins to improve activity tolerance.  OT Frequency: Min 2X/week   Barriers to D/C:            Co-evaluation              AM-PAC OT "6 Clicks" Daily Activity     Outcome Measure Help from another  person eating meals?: A Little Help from another person taking care of personal grooming?: A Little Help from another person toileting, which includes using toliet, bedpan, or urinal?: A Little Help from another person bathing (including washing, rinsing, drying)?: A Little Help from another person to put on and taking off regular upper body clothing?: A Little Help from another person to put on and taking off regular lower body clothing?: A Little 6 Click Score: 18   End of Session Equipment Utilized During Treatment: Gait belt Nurse Communication: Mobility status  Activity Tolerance: Patient limited by fatigue Patient left: in bed;with call bell/phone within reach;with bed alarm set;with family/visitor present  OT Visit Diagnosis: Unsteadiness on feet (R26.81);Other abnormalities of gait and mobility (R26.89);Muscle weakness (generalized) (M62.81)                Time: 6314-9702 OT Time Calculation (min): 21 min Charges:  OT General Charges $OT Visit: 1 Visit OT Evaluation $OT Eval Moderate Complexity: 1 Mod  Cierah Crader H., OTR/L Acute Rehabilitation  Elber Galyean Elane Keelyn Fjelstad 10/21/2020, 2:02 PM

## 2020-10-22 DIAGNOSIS — J410 Simple chronic bronchitis: Secondary | ICD-10-CM

## 2020-10-22 DIAGNOSIS — I4819 Other persistent atrial fibrillation: Secondary | ICD-10-CM

## 2020-10-22 DIAGNOSIS — I959 Hypotension, unspecified: Secondary | ICD-10-CM

## 2020-10-22 DIAGNOSIS — I5033 Acute on chronic diastolic (congestive) heart failure: Secondary | ICD-10-CM | POA: Diagnosis not present

## 2020-10-22 DIAGNOSIS — I272 Pulmonary hypertension, unspecified: Secondary | ICD-10-CM

## 2020-10-22 DIAGNOSIS — E43 Unspecified severe protein-calorie malnutrition: Secondary | ICD-10-CM | POA: Insufficient documentation

## 2020-10-22 DIAGNOSIS — I4821 Permanent atrial fibrillation: Secondary | ICD-10-CM | POA: Diagnosis not present

## 2020-10-22 LAB — COMPREHENSIVE METABOLIC PANEL
ALT: 224 U/L — ABNORMAL HIGH (ref 0–44)
AST: 358 U/L — ABNORMAL HIGH (ref 15–41)
Albumin: 2.3 g/dL — ABNORMAL LOW (ref 3.5–5.0)
Alkaline Phosphatase: 155 U/L — ABNORMAL HIGH (ref 38–126)
Anion gap: 9 (ref 5–15)
BUN: 41 mg/dL — ABNORMAL HIGH (ref 8–23)
CO2: 21 mmol/L — ABNORMAL LOW (ref 22–32)
Calcium: 8 mg/dL — ABNORMAL LOW (ref 8.9–10.3)
Chloride: 99 mmol/L (ref 98–111)
Creatinine, Ser: 1.16 mg/dL — ABNORMAL HIGH (ref 0.44–1.00)
GFR, Estimated: 49 mL/min — ABNORMAL LOW (ref >60)
Glucose, Bld: 111 mg/dL — ABNORMAL HIGH (ref 70–99)
Potassium: 4.4 mmol/L (ref 3.5–5.1)
Sodium: 129 mmol/L — ABNORMAL LOW (ref 135–145)
Total Bilirubin: 1.3 mg/dL — ABNORMAL HIGH (ref 0.3–1.2)
Total Protein: 5.8 g/dL — ABNORMAL LOW (ref 6.5–8.1)

## 2020-10-22 LAB — CBC
HCT: 39.3 % (ref 36.0–46.0)
Hemoglobin: 13.3 g/dL (ref 12.0–15.0)
MCH: 32.5 pg (ref 26.0–34.0)
MCHC: 33.8 g/dL (ref 30.0–36.0)
MCV: 96.1 fL (ref 80.0–100.0)
Platelets: 535 10*3/uL — ABNORMAL HIGH (ref 150–400)
RBC: 4.09 MIL/uL (ref 3.87–5.11)
RDW: 15.3 % (ref 11.5–15.5)
WBC: 14.6 10*3/uL — ABNORMAL HIGH (ref 4.0–10.5)
nRBC: 0 % (ref 0.0–0.2)

## 2020-10-22 LAB — CYTOLOGY - NON PAP

## 2020-10-22 LAB — BRAIN NATRIURETIC PEPTIDE: B Natriuretic Peptide: 1542.5 pg/mL — ABNORMAL HIGH (ref 0.0–100.0)

## 2020-10-22 MED ORDER — FUROSEMIDE 10 MG/ML IJ SOLN: 20.0000 mg | Freq: Two times a day (BID) | INTRAMUSCULAR | Status: DC

## 2020-10-22 MED ORDER — ADULT MULTIVITAMIN W/MINERALS CH
1.0000 | ORAL_TABLET | Freq: Every day | ORAL | Status: DC
  Administered 2020-10-24 – 2020-11-04 (×12): 1 via ORAL
  Filled 2020-10-22 (×14): qty 1

## 2020-10-22 MED ORDER — DILTIAZEM LOAD VIA INFUSION
10.0000 mg | Freq: Once | INTRAVENOUS | Status: DC
  Filled 2020-10-22: qty 10

## 2020-10-22 MED ORDER — ENSURE ENLIVE PO LIQD
237.0000 mL | Freq: Three times a day (TID) | ORAL | Status: DC
  Administered 2020-10-22 – 2020-11-04 (×31): 237 mL via ORAL

## 2020-10-22 MED ORDER — DILTIAZEM HCL-DEXTROSE 125-5 MG/125ML-% IV SOLN (PREMIX)
5.0000 mg/h | INTRAVENOUS | Status: DC
Start: 2020-10-22 — End: 2020-10-26
  Administered 2020-10-22 – 2020-10-25 (×4): 5 mg/h via INTRAVENOUS
  Filled 2020-10-22 (×4): qty 125

## 2020-10-22 NOTE — Progress Notes (Signed)
Initial Nutrition Assessment  DOCUMENTATION CODES:   Severe malnutrition in context of chronic illness  INTERVENTION:   Ensure Enlive po TID, each supplement provides 350 kcal and 20 grams of protein Vital Cuisine Shake BID, each supplement provides 520 kcal and 22 grams of protein MVI with minerals daily  NUTRITION DIAGNOSIS:   Severe Malnutrition related to chronic illness (COPD, CAD) as evidenced by severe muscle depletion, severe fat depletion.  GOAL:   Patient will meet greater than or equal to 90% of their needs  MONITOR:   PO intake, Supplement acceptance  REASON FOR ASSESSMENT:   Consult Assessment of nutrition requirement/status  ASSESSMENT:   75 yo female admitted with PNA and bilateral pleural effusions. PMH includes newly diagnosed A fib, COPD, HTN, HLD, CHF, CAD, macular degeneration (legally blind), osteoporosis, vitamin B-12 deficiency.  S/P thoracentesis 7/13 with 1.7 L of fluid removed from right lung.  Spoke with patient and daughter at bedside. Patient has been eating poorly for a several years. She has had an even further decline over the past month starting a few weeks before she was admitted with cardiomegaly on 6/28. Discussed the importance of adequate protein and calorie intake to promote maintenance / repletion of lean body mass. She has never tried Ensure, but agreed to receive it between meals. Will also try vital cuisine shakes with meals.   Since admission, intake of meals has been documented at 0-10%. This morning she ate a few bites of eggs and toast, half a cup of applesauce, and drank the juice out of the peaches for breakfast. She has also been sipping on diet Dr. Reino Kent.   Labs reviewed. Na 128 CBG: 138 (7/14)  Medications reviewed and include Protonix, Phenergan.  Weight history reviewed. Currently above usual weight d/t fluid overload.  NUTRITION - FOCUSED PHYSICAL EXAM:  Flowsheet Row Most Recent Value  Orbital Region Severe  depletion  Upper Arm Region Moderate depletion  Thoracic and Lumbar Region Severe depletion  Buccal Region Severe depletion  Temple Region Severe depletion  Clavicle Bone Region Severe depletion  Clavicle and Acromion Bone Region Severe depletion  Scapular Bone Region Severe depletion  Dorsal Hand Severe depletion  Patellar Region Moderate depletion  Anterior Thigh Region Moderate depletion  Posterior Calf Region Severe depletion  Edema (RD Assessment) None  Hair Reviewed  Eyes Reviewed  Mouth Reviewed  Skin Reviewed  Nails Reviewed       Diet Order:   Diet Order             Diet Heart Room service appropriate? Yes; Fluid consistency: Thin  Diet effective now                   EDUCATION NEEDS:   Education needs have been addressed  Skin:  Skin Assessment: Reviewed RN Assessment (pretibial skin tear)  Last BM:  7/12  Height:   Ht Readings from Last 1 Encounters:  10/20/20 4\' 8"  (1.422 m)    Weight:   Wt Readings from Last 1 Encounters:  10/22/20 47 kg    Ideal Body Weight:  42.4 kg  BMI:  Body mass index is 23.25 kg/m.  Estimated Nutritional Needs:   Kcal:  1400-1600  Protein:  65-80 gm  Fluid:  1.4-1.6 L    10/24/20, RD, LDN, CNSC Please refer to Amion for contact information.

## 2020-10-22 NOTE — Care Management Important Message (Signed)
Important Message  Patient Details  Name: Laurie James MRN: [id number] Date of Birth: 12/16/1945   Medicare Important Message Given:  Yes     Renie Ora 10/22/2020, 9:25 AM

## 2020-10-22 NOTE — Consult Note (Addendum)
Cardiology Consultation:   Patient ID: Laurie James MRN: [id number]; DOB: May 07, 1945  Admit date: 10/19/2020 Date of Consult: 10/22/2020  PCP:  Georgann Housekeeper, MD   [facility] Providers Cardiologist:  Charlton Haws, MD   {   Patient Profile:   Laurie James is a 75 y.o. female with a hx of CAD, chronic diastolic heart failure, pulmonary hypertension, severe TR, smoker, COPD, HTN, HLD, GERD, hiatal hernia, and anxiety who is being seen 10/22/2020 for the evaluation of CHF at the request of Dr. Rito Ehrlich.  History of Present Illness:   Ms. Bouffard was recently admitted in June 2022 with chest pain.  She had a history of CT coronary that showed likely nonobstructive disease.  In addition, a recent CT chest completed for shortness of breath approximately 2 weeks prior to admission showed severe biatrial enlargement with prominent right atrial dilation, moderate right ventricle dilation.    Her cardiac enzymes remain negative; however, given her coronary disease seen on CTA in 2013 we elected to proceed with definitive angiography.  She had a positive D-dimer in addition to concern for unstable angina and was started on IV heparin.  Echocardiogram that admission showed EF of 55 to 60%, grade 2 diastolic dysfunction, severe biatrial enlargement, severe TR, moderate pulmonic valve regurgitation.  She proceeded to right and left heart catheterization on 10/07/2020 that revealed nonobstructive coronary artery disease.  She did have coronary spasm during the case.  She also had an episode of atrial fibrillation prior to heart cath and was started on oral anticoagulation.  She continued to have persistent atrial fibrillation.  Amiodarone was avoided due to her COPD.  She was started on digoxin, beta-blocker, and diltiazem. She was discharged on DOAC and statin, no ASA given OAC. She was in Afib on telemetry on the day of discharge.  She presented back to [facility] 10/19/20 with  worsening shortness of breath with minimal exertion, generalized weakness, and sent productive cough, chills, and poor oral intake.  Chest x-ray showed bilateral pleural effusions with right greater than left.  She had a leukocytosis with a WBC > 14k. CT chest completed for right pleural effusion. ABX started for CAP.   HS troponin 25 --> 30 BNP 896 --> 1542  Right thoracentesis with 1.7 L removed. Yesterday, pt was nauseated with hypotension and RVR. Nausea and hypotension responded to IV fluids, but still having bouts of RVR. Cardiology was consulted.   Pt is blind. She has had very low energy since discharge. She has not been smoking due to being too weak to go outside. She has had very poor PO intake, but this has improved today. Telemetry with rates in the 130s. Cardizem gtt now running at 5 mg/hr with improvement in rates to 80-100s. She denies chest pain and SOB. No lower extremity edema.    Past Medical History:  Diagnosis Date   AMD (age related macular degeneration)    COPD (chronic obstructive pulmonary disease) (HCC)    Depression    Hiatal hernia    Intermittent low back pain    Memory impairment    Midsternal chest pain    a. 12/2011 Cardiac CTA Ca++ score of 103.3 (80th %), LAD <50p/m, RCA 50-75.   Osteoporosis    Vitamin B 12 deficiency     Past Surgical History:  Procedure Laterality Date   ABDOMINAL HYSTERECTOMY     IR THORACENTESIS ASP PLEURAL SPACE W/IMG GUIDE  10/20/2020   RIGHT/LEFT HEART CATH AND CORONARY ANGIOGRAPHY N/A  10/07/2020   Procedure: RIGHT/LEFT HEART CATH AND CORONARY ANGIOGRAPHY;  Surgeon: Runell Gess, MD;  Location: [facility] INVASIVE CV LAB;  Service: Cardiovascular;  Laterality: N/A;     Home Medications:  Prior to Admission medications   Medication Sig Start Date End Date Taking? Authorizing Provider  albuterol (VENTOLIN HFA) 108 (90 Base) MCG/ACT inhaler Inhale 2 puffs into the lungs every 4 (four) hours as needed for wheezing. 06/08/20  Yes  [provider]  apixaban (ELIQUIS) 5 MG TABS tablet Take 1 tablet (5 mg total) by mouth 2 (two) times daily. 10/11/20  Yes Georgie Chard D, NP  atorvastatin (LIPITOR) 40 MG tablet Take 1 tablet (40 mg total) by mouth daily. 10/12/20  Yes Georgie Chard D, NP  citalopram (CELEXA) 20 MG tablet Take 20 mg by mouth at bedtime. 06/08/20  Yes [provider]  cyanocobalamin (,VITAMIN B-12,) 1000 MCG/ML injection Inject 1,000 mcg into the muscle every 30 (thirty) days. 06/11/16  Yes [provider]  digoxin (LANOXIN) 0.125 MG tablet Take 1 tablet (0.125 mg total) by mouth daily. 10/12/20  Yes Georgie Chard D, NP  diltiazem (CARDIZEM CD) 180 MG 24 hr capsule Take 1 capsule (180 mg total) by mouth daily. 10/12/20  Yes Georgie Chard D, NP  esomeprazole (NEXIUM) 40 MG capsule Take 40 mg by mouth daily.   Yes [provider]  metoprolol tartrate (LOPRESSOR) 25 MG tablet Take 0.5 tablets (12.5 mg total) by mouth 2 (two) times daily. 10/11/20  Yes Georgie Chard D, NP  nitroGLYCERIN (NITROSTAT) 0.4 MG SL tablet Place 1 tablet (0.4 mg total) under the tongue every 5 (five) minutes x 3 doses as needed for chest pain. 10/11/20  Yes Georgie Chard D, NP  promethazine (PHENERGAN) 25 MG tablet Take 25 mg by mouth 3 (three) times daily as needed for nausea. 04/17/16  Yes [provider]    Inpatient Medications: Scheduled Meds:  apixaban  5 mg Oral BID   citalopram  20 mg Oral QHS   digoxin  0.0625 mg Oral Daily   feeding supplement  237 mL Oral TID BM   furosemide  20 mg Intravenous BID   metoprolol tartrate  12.5 mg Oral BID   multivitamin with minerals  1 tablet Oral Daily   pantoprazole  40 mg Oral Daily   Continuous Infusions:  diltiazem (CARDIZEM) infusion 5 mg/hr (10/22/20 1332)   promethazine (PHENERGAN) injection (IM or IVPB) 6.25 mg (10/22/20 0228)   PRN Meds: lidocaine (PF), melatonin, metoprolol tartrate, ondansetron (ZOFRAN) IV, polyethylene glycol, promethazine  (PHENERGAN) injection (IM or IVPB)  Allergies:    Allergies  Allergen Reactions   Codeine Nausea And Vomiting    Social History:   Social History   Socioeconomic History   Marital status: Married    Spouse name: Not on file   Number of children: Not on file   Years of education: Not on file   Highest education level: Not on file  Occupational History   Not on file  Tobacco Use   Smoking status: Every Day    Packs/day: 1.00    Years: 50.00    Pack years: 50.00    Types: Cigarettes   Smokeless tobacco: Never  Substance and Sexual Activity   Alcohol use: No    Alcohol/week: 0.0 standard drinks    Comment: rarely   Drug use: No   Sexual activity: Not on file  Other Topics Concern   Not on file  Social History Narrative   Not on  file   Social Determinants of Health   Financial Resource Strain: Not on file  Food Insecurity: Not on file  Transportation Needs: Not on file  Physical Activity: Not on file  Stress: Not on file  Social Connections: Not on file  Intimate Partner Violence: Not on file    Family History:    Family History  Problem Relation Age of Onset   Breast cancer Sister    Breast cancer Sister      ROS:  Please see the history of present illness.   All other ROS reviewed and negative.     Physical Exam/Data:   Vitals:   10/21/20 2341 10/22/20 0341 10/22/20 1300 10/22/20 1512  BP: 98/71 114/73 131/79 135/71  Pulse: 99 (!) 107 86 86  Resp: 16 15 15 18   Temp: 97.7 F (36.5 C) 97.6 F (36.4 C)    TempSrc: Axillary Oral    SpO2: 96% 97%    Weight:  47 kg    Height:        Intake/Output Summary (Last 24 hours) at 10/22/2020 1552 Last data filed at 10/22/2020 1500 Gross per 24 hour  Intake 1860.68 ml  Output --  Net 1860.68 ml   Last 3 Weights 10/22/2020 10/21/2020 10/20/2020  Weight (lbs) 103 lb 11.2 oz 105 lb 13.1 oz 101 lb 1.6 oz  Weight (kg) 47.038 kg 48 kg 45.859 kg     Body mass index is 23.25 kg/m.  General:  elderly female no  acute distress, on room air HEENT: normal Lymph: no adenopathy Neck: + JVD Vascular: No carotid bruits  Cardiac:  irregular rate and rhythm, loud murmur Lungs:  rhonchi throughout Abd: soft, nontender, no hepatomegaly  Ext: no edema Musculoskeletal:  No deformities, BUE and BLE strength normal and equal Skin: warm and dry  Neuro:  CNs 2-12 intact, no focal abnormalities noted Psych:  Normal affect   EKG:  The EKG was personally reviewed and demonstrates:  atrial fibrillation with ventricular rate 109 Telemetry:  Telemetry was personally reviewed and demonstrates:  afib now in the 80-100s  Relevant CV Studies:  Echo 10/06/20: 1. Left ventricular ejection fraction, by estimation, is 55 to 60%. The  left ventricle has normal function. The left ventricle has no regional  wall motion abnormalities. Left ventricular diastolic parameters are  consistent with Grade II diastolic  dysfunction (pseudonormalization).   2. Right ventricular systolic function is mildly reduced. The right  ventricular size is moderately enlarged. There is mildly elevated  pulmonary artery systolic pressure. The estimated right ventricular  systolic pressure is 44.2 mmHg.   3. Left atrial size was severely dilated.   4. Right atrial size was severely dilated.   5. The mitral valve is normal in structure. Mild mitral valve  regurgitation. No evidence of mitral stenosis.   6. Tricuspid valve regurgitation is severe.   7. The aortic valve is grossly normal. There is mild calcification of the  aortic valve. Aortic valve regurgitation is mild. No aortic stenosis is  present.   8. Pulmonic valve regurgitation is moderate.   9. The inferior vena cava is dilated in size with <50% respiratory  variability, suggesting right atrial pressure of 15 mmHg.    Right and left heart cath 10/07/20: IMPRESSION: Ms. Bartholomew has nonobstructive coronary artery disease.  She did have a spasm of the ostium of her large dominant RCA  without damping.  The initial nonselective shot did not show a tight stenosis.  She did have 40% segmental mid  RCA stenosis as well as 50 to 60% segmental second diagonal branch stenosis all of which can be treated medically.  Her systolic pressure was low as was her EDP and filling pressures.  She will need to be treated with a DOAC for her A. fib.  Medical therapy otherwise.  The radial sheath was removed and a TR band was placed on the right wrist to achieve patent hemostasis.  The patient left the lab in stable condition.   Laboratory Data:  High Sensitivity Troponin:   Recent Labs  Lab 10/05/20 1347 10/05/20 1713 10/19/20 2115 10/20/20 0043  TROPONINIHS 14 16 25* 30*     Chemistry Recent Labs  Lab 10/20/20 0331 10/21/20 0129 10/22/20 0242  NA 129* 128* 129*  K 4.5 4.3 4.4  CL 97* 94* 99  CO2 20* 23 21*  GLUCOSE 147* 133* 111*  BUN 24* 32* 41*  CREATININE 0.86 1.15* 1.16*  CALCIUM 8.6* 8.5* 8.0*  GFRNONAA >60 50* 49*  ANIONGAP 12 11 9     Recent Labs  Lab 10/20/20 0331 10/21/20 0129 10/22/20 0242  PROT 6.5 6.0* 5.8*  ALBUMIN 2.7* 2.4* 2.3*  AST 154* 197* 358*  ALT 101* 133* 224*  ALKPHOS 144* 151* 155*  BILITOT 1.6* 1.5* 1.3*   Hematology Recent Labs  Lab 10/20/20 0331 10/21/20 0129 10/22/20 0242  WBC 14.4* 18.9* 14.6*  RBC 4.06 4.12 4.09  HGB 13.4 13.8 13.3  HCT 39.5 39.3 39.3  MCV 97.3 95.4 96.1  MCH 33.0 33.5 32.5  MCHC 33.9 35.1 33.8  RDW 15.0 15.1 15.3  PLT 599* 615* 535*   BNP Recent Labs  Lab 10/19/20 2115 10/22/20 0242  BNP 895.8* 1,542.5*    DDimer No results for input(s): DDIMER in the last 168 hours.   Radiology/Studies:  DG Chest 1 View  Result Date: 10/20/2020 CLINICAL DATA:  Post right thoracentesis EXAM: CHEST  1 VIEW COMPARISON:  10/19/2020 FINDINGS: Interval removal of large amount of right pleural effusion with minimal residual right effusion. No pneumothorax. Minimal residual right lower lobe atelectasis Small left effusion  and mild left lower lobe airspace disease unchanged. Negative for edema. IMPRESSION: No complication post large volume right thoracentesis. Electronically Signed   By: Marlan Palau M.D.   On: 10/20/2020 13:29   CT Chest Wo Contrast  Result Date: 10/20/2020 CLINICAL DATA:  Chest pain and shortness of breath EXAM: CT CHEST WITHOUT CONTRAST TECHNIQUE: Multidetector CT imaging of the chest was performed following the standard protocol without IV contrast. COMPARISON:  Chest x-ray from the previous day, CT from 10/05/2020 FINDINGS: Cardiovascular: Somewhat limited due to lack of IV contrast. Diffuse aortic calcifications are noted without aneurysmal dilatation. Coronary calcifications are seen. Small pericardial effusion is noted new from the prior study. Mediastinum/Nodes: Esophagus as visualized is within normal limits. No sizable hilar or mediastinal adenopathy is noted. The thoracic inlet is within normal limits. Lungs/Pleura: Consolidation in the right lower lobe is noted with associated large pleural effusion. Small left-sided effusion is noted as well. Diffuse emphysematous changes are seen. Apical scarring is again identified and stable. No sizable parenchymal nodule is seen. Upper Abdomen: No acute abnormality in the upper abdomen is seen. Musculoskeletal: Degenerative changes of the thoracic spine are noted. No acute bony abnormality is seen. IMPRESSION: Right lower lobe infiltrate with associated large effusion. Small left effusion. Biapical pleural and parenchymal scarring stable from previous exam. Aortic Atherosclerosis (ICD10-I70.0) and Emphysema (ICD10-J43.9). Electronically Signed   By: Alcide Clever M.D.   On: 10/20/2020  02:18   DG Chest Portable 1 View  Result Date: 10/19/2020 CLINICAL DATA:  Shortness of breath EXAM: PORTABLE CHEST 1 VIEW COMPARISON:  October 05, 2020. FINDINGS: Cardiomediastinal silhouette is partially obscured but appears unchanged. Aortic atherosclerosis. New large right  pleural effusion with adjacent compressive atelectasis. Small left pleural effusion with adjacent compressive atelectasis. The visualized skeletal structures are unchanged. IMPRESSION: 1. New large right pleural effusion with adjacent compressive atelectasis. 2. New small left pleural effusion with adjacent compressive atelectasis. Electronically Signed   By: Maudry MayhewJeffrey  Waltz MD   On: 10/19/2020 22:20   DG Abd Portable 1V  Result Date: 10/21/2020 CLINICAL DATA:  Ileus EXAM: PORTABLE ABDOMEN - 1 VIEW COMPARISON:  None. FINDINGS: Interstitial and patchy airspace disease in the right lung base with small bilateral pleural effusions noted. Mild gaseous distention of the stomach. There is mild diffuse gaseous distention of small bowel and colon in a nonobstructive pattern. Thoracolumbar scoliosis evident. Telemetry leads overlie the chest. IMPRESSION: 1. Nonobstructive bowel gas pattern with mild gaseous distention of the stomach and colon. Component of mild ileus not excluded. 2. Patchy interstitial and airspace disease at the right lung base with small bilateral pleural effusions. Electronically Signed   By: Kennith CenterEric  Mansell M.D.   On: 10/21/2020 13:51   IR THORACENTESIS ASP PLEURAL SPACE W/IMG GUIDE  Result Date: 10/20/2020 INDICATION: Patient with newly diagnosed atrial fibrillation presents today with right pleural effusion. Interventional radiology asked to perform a diagnostic and therapeutic thoracentesis. EXAM: ULTRASOUND GUIDED THORACENTESIS MEDICATIONS: 1% lidocaine 10 mL COMPLICATIONS: None immediate. PROCEDURE: An ultrasound guided thoracentesis was thoroughly discussed with the patient and questions answered. The benefits, risks, alternatives and complications were also discussed. The patient understands and wishes to proceed with the procedure. Written consent was obtained. Ultrasound was performed to localize and mark an adequate pocket of fluid in the right chest. The area was then prepped and draped  in the normal sterile fashion. 1% Lidocaine was used for local anesthesia. Under ultrasound guidance a 6 Fr Safe-T-Centesis catheter was introduced. Thoracentesis was performed. The catheter was removed and a dressing applied. FINDINGS: A total of approximately 1.7 L of amber-colored fluid was removed. Samples were sent to the laboratory as requested by the clinical team. IMPRESSION: Successful ultrasound guided right thoracentesis yielding 1.7 L of pleural fluid. Read by: Alwyn RenJamie Covington, NP Electronically Signed   By: Simonne ComeJohn  Watts M.D.   On: 10/20/2020 13:48     Assessment and Plan:   Acute on chronic diastolic heart failure Pulmonary hypertension Severe TR Right heart enlargement - recent echo with EF 55-60%, grade II DD, RVSP 44.2 mmHg, severe biatrial enlargement, severe TR, moderate pulmonic valve regurgitation - BNP 1542 - up from 733 2 weeks ago - given poor PO intake, would hold off on IV lasix at this time   Atrial fibrillation with RVR Chronic anticoagulation - home: 180 mg diltiazem, 0.125 mg digoxin, 12.5 mg lopressor BID - continue 5 mg eliquis BID - digoxin level 1.4, digoxin reduced to 0.0625 mg - episodes of RVR - likely due to acute illness - despite severe biatrial enlargement, will attempt to restore rhythm - avoid amiodarone given lung disease  - running on cardizem gtt at 5 mg/hr with improvement in rates to 80-100s - I have put her on the TEE-DCCV schedule on Monday at 2pm with Dr. Mayford Knifeurner   AKI - creatinine clearance 26.9 ml/min - sCr 1.16 - will hopefully improve with better PO intake   HCAP Leukocytosis  Large right  pleural effusion - right thoracentesis with 1.7 L removed - WBC 14.6 - ABX per primary     [facility] has been requested to perform a transesophageal echocardiogram on [facility] for Afib.  After careful review of history and examination, the risks and benefits of transesophageal echocardiogram have been explained  including risks of esophageal damage, perforation (1:10,000 risk), bleeding, pharyngeal hematoma as well as other potential complications associated with conscious sedation including aspiration, arrhythmia, respiratory failure and death. Alternatives to treatment were discussed, questions were answered. Patient is willing to proceed.   Scheduled Monday at 2pm with Dr. Mayford Knife.   Signed, Marcelino Duster, [place]  10/22/2020 3:52 PM   ATTENDING ATTESTATION  I have seen, examined and evaluated the patient this PM along with Micah Flesher, PA-C.  After reviewing all the available data and chart, we discussed the patients laboratory, study & physical findings as well as symptoms in detail. I agree with her findings, examination as well as impression recommendations as per our discussion.    Single presents here with recurrent dyspnea and basically failure to thrive now found to have pneumonia and essentially failure to thrive with decreased p.o. intake as well as generalized weakness and cough..  She is in A. fib with relatively well-controlled rates on diltiazem.  Although she does have a elevated BNP, she is lying flat without any orthopnea or PND.  Minimal edema.  I suspect that the BNP may be elevated because of was not persistent A. fib, and not necessarily from true CHF.  Would be very reluctant to consider diuretic on her especially considering that she is not eating and drinking regularly. .  She recently had a right left heart catheterization showing minimal CAD coronary spasm and elevated PA pressures suggestive of mild pulmonary pretension.  Preserved EF on echo.  For now I think continuing with diltiazem for rate control is reasonable, and we can tentatively schedule her for TEE cardioversion on Monday.  Previous evaluation showing biatrial enlargement make maintaining sinus rhythm unlikely without antiarrhythmic, however hopefully we can buy him enough time to recover from her active  pneumonia.   She has been listed which is probably not favorable medication given her advanced age.  On very low-dose beta-blockers which we cannot titrate further because of blood pressure.  She is on diltiazem infusion here as opposed to p.o. diltiazem.  She is on Eliquis, but has not been on it long enough to cardiovert without TEE.      Bryan Lemma, M.D., M.S. Interventional Cardiologist   Pager # [phone] Phone # [phone] [address] [place], [place] [address]   Risk Assessment/Risk Scores:  CHA2DS2-VASc Score = 6 This indicates a 9.7% annual risk of stroke. The patient's score is based upon: CHF History: Yes HTN History: Yes Diabetes History: No Stroke History: No Vascular Disease History: Yes Age Score: 2 Gender Score: 1       For questions or updates, please contact [facility] Please consult [email or web address] for contact info under

## 2020-10-22 NOTE — Evaluation (Signed)
Physical Therapy Evaluation Patient Details Name: Laurie James MRN: [id number] DOB: 1945/08/20 Today's Date: 10/22/2020   History of Present Illness  75 y.o. F admitted to [facility] due to worsening SOB and increased weakness. Work-up reveals pneumonia and bilateral pleural effusions, right greater than left. Her prior medical history is significant for newly diagnosed A. fib, COPD, hypertension, hyperlipidemia, chronic anxiety/depression, GERD.  Clinical Impression  Pt presents to PT with generalized weakness, fatigue, and instability. Pt requires assistance to complete transfers and close guard with ambulation due to increased sway and shuffling gait. Pt will benefit from continued acute POT services to improve LE strength and balance in an effort to reduce falls risk. PT discusses recommendation for SNF placement and use of walker however the pt is not agreeable to either at this time. PT thus recommends HHPT and assistance from family for all out of bed mobility.    Follow Up Recommendations Home health PT (pt refusing SNF recommendation)    Equipment Recommendations   (TBD, pt declining RW use but may benefit from support)    Recommendations for Other Services       Precautions / Restrictions Precautions Precautions: Fall Precaution Comments: tachycardia Restrictions Weight Bearing Restrictions: No      Mobility  Bed Mobility Overal bed mobility: Needs Assistance Bed Mobility: Supine to Sit     Supine to sit: Supervision          Transfers Overall transfer level: Needs assistance Equipment used: 1 person hand held assist Transfers: Sit to/from Stand;Stand Pivot Transfers Sit to Stand: Min assist Stand pivot transfers: Min assist       General transfer comment: dtr providing hand hold to pull up into stand  Ambulation/Gait Ambulation/Gait assistance: Min guard Gait Distance (Feet): 20 Feet Assistive device: None (pt declines attempts at mobilizing  with walker due to fatigue and personal preference) Gait Pattern/deviations: Shuffle Gait velocity: reduced Gait velocity interpretation: <1.8 ft/sec, indicate of risk for recurrent falls General Gait Details: pt with short shuffling steps, daughter reports this to be baseline. Increased lateral sway. Pt reaching for UE support of IV or bedrail at times  Stairs            Wheelchair Mobility    Modified Rankin (Stroke Patients Only)       Balance Overall balance assessment: Needs assistance Sitting-balance support: No upper extremity supported;Feet supported Sitting balance-Leahy Scale: Good     Standing balance support: No upper extremity supported Standing balance-Leahy Scale: Fair                               Pertinent Vitals/Pain Pain Assessment: No/denies pain    Home Living Family/patient expects to be discharged to:: Private residence Living Arrangements: Spouse/significant other Available Help at Discharge: Family;Available 24 hours/day Type of Home: House Home Access: Stairs to enter Entrance Stairs-Rails: None Entrance Stairs-Number of Steps: 3 steps Home Layout: Two level Home Equipment: None      Prior Function Level of Independence: Independent         Comments: independent but has been gradually declining over last month     Hand Dominance   Dominant Hand: Right    Extremity/Trunk Assessment   Upper Extremity Assessment Upper Extremity Assessment: Generalized weakness    Lower Extremity Assessment Lower Extremity Assessment: Generalized weakness    Cervical / Trunk Assessment Cervical / Trunk Assessment: Kyphotic  Communication   Communication: No difficulties  Cognition Arousal/Alertness:  Awake/alert Behavior During Therapy: WFL for tasks assessed/performed Overall Cognitive Status: Within Functional Limits for tasks assessed                                        General Comments General  comments (skin integrity, edema, etc.): HR ranging from 120-144 during session with mobility, pt quick to fatigue    Exercises     Assessment/Plan    PT Assessment Patient needs continued PT services  PT Problem List Decreased strength;Decreased activity tolerance;Decreased balance;Decreased mobility;Cardiopulmonary status limiting activity;Decreased knowledge of use of DME       PT Treatment Interventions DME instruction;Gait training;Stair training;Functional mobility training;Therapeutic activities;Therapeutic exercise;Balance training;Patient/family education    PT Goals (Current goals can be found in the Care Plan section)  Acute Rehab PT Goals Patient Stated Goal: to improve strength and activity tolerance PT Goal Formulation: With patient/family Time For Goal Achievement: 11/05/20 Potential to Achieve Goals: Good    Frequency Min 3X/week   Barriers to discharge        Co-evaluation               AM-PAC PT "6 Clicks" Mobility  Outcome Measure Help needed turning from your back to your side while in a flat bed without using bedrails?: A Little Help needed moving from lying on your back to sitting on the side of a flat bed without using bedrails?: A Little Help needed moving to and from a bed to a chair (including a wheelchair)?: A Little Help needed standing up from a chair using your arms (e.g., wheelchair or bedside chair)?: A Little Help needed to walk in hospital room?: A Little Help needed climbing 3-5 steps with a railing? : A Lot 6 Click Score: 17    End of Session   Activity Tolerance: Patient limited by fatigue Patient left: in chair;with call bell/phone within reach;with family/visitor present Nurse Communication: Mobility status PT Visit Diagnosis: Unsteadiness on feet (R26.81);Muscle weakness (generalized) (M62.81)    Time: 1607-3710 PT Time Calculation (min) (ACUTE ONLY): 14 min   Charges:   PT Evaluation $PT Eval Low Complexity: 1 Low           Arlyss Gandy, PT, DPT Acute Rehabilitation Pager: [phone]   Arlyss Gandy 10/22/2020, 9:57 AM

## 2020-10-22 NOTE — Progress Notes (Signed)
PROGRESS NOTE  Laurie James Laurie James [id number]:[id number] DOB: October 22, 1945 DOA: 10/19/2020 PCP: Georgann Housekeeper, MD  HPI/Recap of past 24 hours: 75 y.o. female with medical history significant for recent hospitalization and discharge a week ago with newly diagnosed A. fib, plus history of COPD, hypertension presented to the ED on 7/12 with weakness, shortness of breath, cough and found to have pneumonia ane bilateral pleural effusions.  Started on IV antibiotics and admitted to the hospitalist service. Associated with generalized weakness, semi productive cough, chills, poor oral intake.  Following admission, interventional radiology removed 1.7 L of fluid from right lung by thoracentesis.  Patient's breathing improved.  Blood pressure became quite soft on 7/14.  Patient complaining of feeling very weak and nauseated.  Also developed some rapid atrial fibrillation.  Started on IV fluids which did improve her blood pressure.  This morning, patient is feeling better, more alert.  Tolerating p.o. and less nauseated.  However still having rapid A. fib, even at rest.  Assessment/Plan: Principal Problem:   Bilateral pleural effusions/Acute on chronic diastolic CHF (congestive heart failure) (HCC): Echo from 6/29 notes grade 2 diastolic dysfunction.  Status postthoracentesis with 1.7 L removed Active Problems:   CAD (coronary artery disease): Stable during this hospitalization.    COPD (chronic obstructive pulmonary disease) (HCC): breathing somewhat labored, but looks to be more from CHF.    Atrial fibrillation [facility]): Some rapid ventricular rate.  Initially caused by hypotension and now may be from mild volume overload although patient appears to be more euvolemic than anything else.  Have started Cardizem drip.  Nausea: Minimal relief with Zofran.  Abdominal x-ray unrevealing.  Suspect may be secondary to hypotension.  Patient put on some gentle fluids.    Pulmonary HTN (HCC)   HLD  (hyperlipidemia)      Depression: Stable, cont home meds  Severe protein calorie malnutrition: Seen by nutrition. Nutrition Status: Nutrition Problem: Severe Malnutrition Etiology: chronic illness (COPD, CAD) Signs/Symptoms: severe muscle depletion, severe fat depletion Interventions: Ensure Enlive (each supplement provides 350kcal and 20 grams of protein), Hormel Shake, MVI    HCAP (healthcare-associated pneumonia): cont IV rocephin and Zithromax.  Sepsis ruled out.  Procalcitonin level normal and antibiotics discontinued.  Hyponatremia: likely dilutional in the context of acute heart failure Code Status: Full Code   Family Communication: Daughter at bedside   Disposition Plan: anticipate discharge hopefully this weekend once atrial fibrillation controlled   Consultants: Int Radiology   Procedures: Thoracentesis done 7/13  Antimicrobials: IV Rocephin and Zithromax 7/13-7/14  DVT prophylaxis:  Eliquis  Level of care: Telemetry Cardiac   Objective: Vitals:   10/21/20 2341 10/22/20 0341  BP: 98/71 114/73  Pulse: 99 (!) 107  Resp: 16 15  Temp: 97.7 F (36.5 C) 97.6 F (36.4 C)  SpO2: 96% 97%    Intake/Output Summary (Last 24 hours) at 10/22/2020 0951 Last data filed at 10/22/2020 0600 Gross per 24 hour  Intake 1478.57 ml  Output --  Net 1478.57 ml    Filed Weights   10/20/20 1055 10/21/20 0413 10/22/20 0341  Weight: 45.9 kg 48 kg 47 kg   Body mass index is 23.25 kg/m.  Exam:  General: Alert and oriented x3, no acute distress HEENT: Normocephalic, atraumatic, mucous membranes are slightly dry Cardiovascular: Irregular rhythm, tachycardic Respiratory: Decreased breath sounds bibasilar Abdomen: Soft, nontender, nondistended, f some bowel sounds Musculoskeletal: No clubbing or cyanosis or edema Skin: Tanned skin, no skin breaks, tears.  Noted lesion on nose suspicious for basal cell  carcinoma Psychiatry: Appropriate, no evidence of psychoses Neurology:  No focal deficits   Data Reviewed: CBC: Recent Labs  Lab 10/19/20 2115 10/20/20 0331 10/21/20 0129 10/22/20 0242  WBC 14.8* 14.4* 18.9* 14.6*  NEUTROABS 13.1*  --   --   --   HGB 13.9 13.4 13.8 13.3  HCT 41.5 39.5 39.3 39.3  MCV 97.9 97.3 95.4 96.1  PLT 558* 599* 615* 535*    Basic Metabolic Panel: Recent Labs  Lab 10/19/20 2115 10/20/20 0331 10/21/20 0129 10/22/20 0242  NA 130* 129* 128* 129*  K 4.5 4.5 4.3 4.4  CL 95* 97* 94* 99  CO2 21* 20* 23 21*  GLUCOSE 145* 147* 133* 111*  BUN 25* 24* 32* 41*  CREATININE 0.96 0.86 1.15* 1.16*  CALCIUM 8.6* 8.6* 8.5* 8.0*  MG  --  2.0  --   --   PHOS  --  3.8  --   --     GFR: Estimated Creatinine Clearance: 26.9 mL/min (A) (by C-G formula based on SCr of 1.16 mg/dL (H)). Liver Function Tests: Recent Labs  Lab 10/19/20 2115 10/20/20 0331 10/21/20 0129 10/22/20 0242  AST 165* 154* 197* 358*  ALT 103* 101* 133* 224*  ALKPHOS 161* 144* 151* 155*  BILITOT 1.8* 1.6* 1.5* 1.3*  PROT 6.8 6.5 6.0* 5.8*  ALBUMIN 2.8* 2.7* 2.4* 2.3*    No results for input(s): LIPASE, AMYLASE in the last 168 hours. No results for input(s): AMMONIA in the last 168 hours. Coagulation Profile: No results for input(s): INR, PROTIME in the last 168 hours. Cardiac Enzymes: No results for input(s): CKTOTAL, CKMB, CKMBINDEX, TROPONINI in the last 168 hours. BNP (last 3 results) No results for input(s): PROBNP in the last 8760 hours. HbA1C: No results for input(s): HGBA1C in the last 72 hours. CBG: Recent Labs  Lab 10/21/20 1541  GLUCAP 138*    Lipid Profile: No results for input(s): CHOL, HDL, LDLCALC, TRIG, CHOLHDL, LDLDIRECT in the last 72 hours. Thyroid Function Tests: No results for input(s): TSH, T4TOTAL, FREET4, T3FREE, THYROIDAB in the last 72 hours. Anemia Panel: No results for input(s): VITAMINB12, FOLATE, FERRITIN, TIBC, IRON, RETICCTPCT in the last 72 hours. Urine analysis:    Component Value Date/Time   COLORURINE  YELLOW 10/06/2020 0552   APPEARANCEUR HAZY (A) 10/06/2020 0552   LABSPEC 1.036 (H) 10/06/2020 0552   PHURINE 5.0 10/06/2020 0552   GLUCOSEU NEGATIVE 10/06/2020 0552   HGBUR SMALL (A) 10/06/2020 0552   BILIRUBINUR NEGATIVE 10/06/2020 0552   KETONESUR NEGATIVE 10/06/2020 0552   PROTEINUR 30 (A) 10/06/2020 0552   NITRITE NEGATIVE 10/06/2020 0552   LEUKOCYTESUR NEGATIVE 10/06/2020 0552   Sepsis Labs: @LABRCNTIP (procalcitonin:4,lacticidven:4)  ) Recent Results (from the past 240 hour(s))  Resp Panel by RT-PCR (Flu A&B, Covid) Nasopharyngeal Swab     Status: None   Collection Time: 10/19/20  9:37 PM   Specimen: Nasopharyngeal Swab; Nasopharyngeal(NP) swabs in vial transport medium  Result Value Ref Range Status   SARS Coronavirus 2 by RT PCR NEGATIVE NEGATIVE Final    Comment: (NOTE) SARS-CoV-2 target nucleic acids are NOT DETECTED.  The SARS-CoV-2 RNA is generally detectable in upper respiratory specimens during the acute phase of infection. The lowest concentration of SARS-CoV-2 viral copies this assay can detect is 138 copies/mL. A negative result does not preclude SARS-Cov-2 infection and should not be used as the sole basis for treatment or other patient management decisions. A negative result may occur with  improper specimen collection/handling, submission of specimen other than  nasopharyngeal swab, presence of viral mutation(s) within the areas targeted by this assay, and inadequate number of viral copies(<138 copies/mL). A negative result must be combined with clinical observations, patient history, and epidemiological information. The expected result is Negative.  Fact Sheet for Patients:  BloggerCourse.com  Fact Sheet for Healthcare Providers:  SeriousBroker.it  This test is no t yet approved or cleared by the [place] FDA and  has been authorized for detection and/or diagnosis of SARS-CoV-2 by FDA under an  Emergency Use Authorization (EUA). This EUA will remain  in effect (meaning this test can be used) for the duration of the COVID-19 declaration under Section 564(b)(1) of the Act, 21 U.S.C.section 360bbb-3(b)(1), unless the authorization is terminated  or revoked sooner.       Influenza A by PCR NEGATIVE NEGATIVE Final   Influenza B by PCR NEGATIVE NEGATIVE Final    Comment: (NOTE) The Xpert Xpress SARS-CoV-2/FLU/RSV plus assay is intended as an aid in the diagnosis of influenza from Nasopharyngeal swab specimens and should not be used as a sole basis for treatment. Nasal washings and aspirates are unacceptable for Xpert Xpress SARS-CoV-2/FLU/RSV testing.  Fact Sheet for Patients: BloggerCourse.com  Fact Sheet for Healthcare Providers: SeriousBroker.it  This test is not yet approved or cleared by the [place] FDA and has been authorized for detection and/or diagnosis of SARS-CoV-2 by FDA under an Emergency Use Authorization (EUA). This EUA will remain in effect (meaning this test can be used) for the duration of the COVID-19 declaration under Section 564(b)(1) of the Act, 21 U.S.C. section 360bbb-3(b)(1), unless the authorization is terminated or revoked.  Performed at [facility], [address] [place], [place] [address]   Body fluid culture w Gram Stain     Status: None (Preliminary result)   Collection Time: 10/20/20  1:09 PM   Specimen: Lung, Right; Pleural Fluid  Result Value Ref Range Status   Specimen Description PLEURAL FLUID  Final   Special Requests LUNG RIGHT SPEC A  Final   Gram Stain   Final    FEW WBC PRESENT, PREDOMINANTLY MONONUCLEAR NO ORGANISMS SEEN    Culture   Final    NO GROWTH 2 DAYS Performed at [facility], [address], [place], [place] [address]    Report Status PENDING  Incomplete      Studies: DG Abd Portable 1V  Result Date: 10/21/2020 CLINICAL DATA:  Ileus EXAM:  PORTABLE ABDOMEN - 1 VIEW COMPARISON:  None. FINDINGS: Interstitial and patchy airspace disease in the right lung base with small bilateral pleural effusions noted. Mild gaseous distention of the stomach. There is mild diffuse gaseous distention of small bowel and colon in a nonobstructive pattern. Thoracolumbar scoliosis evident. Telemetry leads overlie the chest. IMPRESSION: 1. Nonobstructive bowel gas pattern with mild gaseous distention of the stomach and colon. Component of mild ileus not excluded. 2. Patchy interstitial and airspace disease at the right lung base with small bilateral pleural effusions. Electronically Signed   By: Kennith Center M.D.   On: 10/21/2020 13:51    Scheduled Meds:  apixaban  5 mg Oral BID   citalopram  20 mg Oral QHS   digoxin  0.0625 mg Oral Daily   metoprolol tartrate  12.5 mg Oral BID   pantoprazole  40 mg Oral Daily    Continuous Infusions:  sodium chloride 75 mL/hr at 10/22/20 0053   promethazine (PHENERGAN) injection (IM or IVPB) 6.25 mg (10/22/20 0228)     LOS: 3  days     Hollice Espy, MD [facility]   10/22/2020, 9:51 AM

## 2020-10-22 NOTE — Plan of Care (Signed)
  Problem: Education: Goal: Knowledge of General Education information will improve Description Including pain rating scale, medication(s)/side effects and non-pharmacologic comfort measures Outcome: Progressing   

## 2020-10-23 ENCOUNTER — Inpatient Hospital Stay (HOSPITAL_COMMUNITY): Payer: Medicare Other

## 2020-10-23 DIAGNOSIS — J90 Pleural effusion, not elsewhere classified: Secondary | ICD-10-CM

## 2020-10-23 DIAGNOSIS — I4821 Permanent atrial fibrillation: Secondary | ICD-10-CM

## 2020-10-23 DIAGNOSIS — I2583 Coronary atherosclerosis due to lipid rich plaque: Secondary | ICD-10-CM

## 2020-10-23 DIAGNOSIS — I2510 Atherosclerotic heart disease of native coronary artery without angina pectoris: Secondary | ICD-10-CM

## 2020-10-23 DIAGNOSIS — I5033 Acute on chronic diastolic (congestive) heart failure: Secondary | ICD-10-CM | POA: Diagnosis not present

## 2020-10-23 LAB — CBC
HCT: 40.5 % (ref 36.0–46.0)
Hemoglobin: 13.7 g/dL (ref 12.0–15.0)
MCH: 32.9 pg (ref 26.0–34.0)
MCHC: 33.8 g/dL (ref 30.0–36.0)
MCV: 97.1 fL (ref 80.0–100.0)
Platelets: 489 10*3/uL — ABNORMAL HIGH (ref 150–400)
RBC: 4.17 MIL/uL (ref 3.87–5.11)
RDW: 15.5 % (ref 11.5–15.5)
WBC: 13.8 10*3/uL — ABNORMAL HIGH (ref 4.0–10.5)
nRBC: 0 % (ref 0.0–0.2)

## 2020-10-23 LAB — BODY FLUID CULTURE W GRAM STAIN: Culture: NO GROWTH

## 2020-10-23 LAB — BASIC METABOLIC PANEL
Anion gap: 9 (ref 5–15)
BUN: 48 mg/dL — ABNORMAL HIGH (ref 8–23)
CO2: 23 mmol/L (ref 22–32)
Calcium: 8.4 mg/dL — ABNORMAL LOW (ref 8.9–10.3)
Chloride: 99 mmol/L (ref 98–111)
Creatinine, Ser: 1.05 mg/dL — ABNORMAL HIGH (ref 0.44–1.00)
GFR, Estimated: 55 mL/min — ABNORMAL LOW (ref >60)
Glucose, Bld: 128 mg/dL — ABNORMAL HIGH (ref 70–99)
Potassium: 4.8 mmol/L (ref 3.5–5.1)
Sodium: 131 mmol/L — ABNORMAL LOW (ref 135–145)

## 2020-10-23 LAB — BRAIN NATRIURETIC PEPTIDE: B Natriuretic Peptide: 1337.4 pg/mL — ABNORMAL HIGH (ref 0.0–100.0)

## 2020-10-23 LAB — PROCALCITONIN: Procalcitonin: 0.19 ng/mL

## 2020-10-23 LAB — MAGNESIUM: Magnesium: 2.2 mg/dL (ref 1.7–2.4)

## 2020-10-23 MED ORDER — ATORVASTATIN CALCIUM 40 MG PO TABS: 40.0000 mg | ORAL_TABLET | Freq: Every day | ORAL | Status: DC

## 2020-10-23 NOTE — Plan of Care (Signed)

## 2020-10-23 NOTE — Progress Notes (Signed)
PROGRESS NOTE  Laurie James Laurie James VQM:[id number] DOB: [birth year] DOA: 10/19/2020 PCP: Georgann Housekeeper, MD  HPI/Recap of past 24 hours: 75 y.o. female with medical history significant for recent hospitalization and discharge a week ago with newly diagnosed A. fib, plus history of COPD, hypertension presented to the ED on 7/12 with weakness, shortness of breath, cough and found to have pneumonia ane bilateral pleural effusions.  Started on IV antibiotics and admitted to the hospitalist service. Associated with generalized weakness, semi productive cough, chills, poor oral intake.  Following admission, interventional radiology removed 1.7 L of fluid from right lung by thoracentesis.  Patient's breathing improved.  Blood pressure became quite soft on 7/14.  Patient complaining of feeling very weak and nauseated.  Also developed some rapid atrial fibrillation.  He subsequently received IV fluids.  Cardiology following.  Monitor.   Subjective.  Patient in bed, appears comfortable, denies any headache, no fever, no chest pain or pressure, no shortness of breath , no abdominal pain. No focal weakness.  Assessment/Plan:   Acute hypoxic respiratory failure causing shortness of breath due to bilateral pleural effusions/Acute on chronic diastolic CHF EF on 10/06/2020 shows a EF of 60%.  Status postthoracentesis with 1.7 L removed, currently on room air and not short of breath.  Monitor closely cardiology on board.  Paroxysmal A. fib [place] vas 2 score of greater than 3.  Currently on Cardizem drip along with digoxin, continue Eliquis, cardiology on board, undergoing TEE with cardioversion on Monday.  CAD  Stable during this hospitalization.  Transaminitis.  Asymptomatic and could be due to hepatic congestion from CHF, will monitor trend, hold home dose statin, check right upper quadrant ultrasound and monitor.    HCAP (healthcare-associated pneumonia): Sepsis was ruled out and IV antibiotics were  stopped on 10/22/2020, chest x-ray on 10/23/2020 noted, trend procalcitonin, currently afebrile, aspiration precautions and speech eval.  Low threshold for adding Unasyn. Monitor.  Ongoing history of smoking along with underlying history of COPD - she still to quit smoking, supportive care for COPD no acute issues..  Nausea: Improved with supportive care.  No nausea vomiting.  Pulmonary HTN (HCC) - supportive care outpatient follow-up with pulmonary.  HLD (hyperlipidemia) - home dose statin on hold due to transaminitis.    Depression: Stable, cont home meds  Hyponatremia: likely dilutional in the context of acute heart failure diurese as tolerated and monitor.   Severe protein calorie malnutrition: Seen by nutrition. Nutrition Status: Nutrition Problem: Severe Malnutrition Etiology: chronic illness (COPD, CAD) Signs/Symptoms: severe muscle depletion, severe fat depletion Interventions: Ensure Enlive (each supplement provides 350kcal and 20 grams of protein), Hormel Shake, MVI     Code Status: Full Code   Family Communication: son at bedside 10/23/20  Disposition Plan: anticipate discharge hopefully this weekend once atrial fibrillation controlled   Consultants: Int Radiology   Procedures: Thoracentesis done 7/13  Antimicrobials: IV Rocephin and Zithromax 7/13-7/14  DVT prophylaxis:  Eliquis  Level of care: Telemetry Cardiac   Objective: Vitals:   10/23/20 0451 10/23/20 0818  BP: (!) 117/94 116/84  Pulse: (!) 51 94  Resp: 17 18  Temp: (!) 97.1 F (36.2 C) 97.7 F (36.5 C)  SpO2: 97% 95%    Intake/Output Summary (Last 24 hours) at 10/23/2020 0957 Last data filed at 10/22/2020 1500 Gross per 24 hour  Intake 382.11 ml  Output --  Net 382.11 ml   Filed Weights   10/21/20 0413 10/22/20 0341 10/23/20 0451  Weight: 48 kg 47 kg 49.1  kg   Body mass index is 24.27 kg/m.  Exam:  Awake Alert, No new F.N deficits, Normal affect [place].AT,PERRAL Supple Neck,No JVD,  No cervical lymphadenopathy appriciated.  Symmetrical Chest wall movement, Good air movement bilaterally, bibasilar Rales iRRR,No Gallops, Rubs or new Murmurs, No Parasternal Heave +ve B.Sounds, Abd Soft, No tenderness, No organomegaly appriciated, No rebound - guarding or rigidity. No Cyanosis, Clubbing or edema, No new Rash or bruise    Data Reviewed:  Recent Labs  Lab 10/19/20 2115 10/20/20 0331 10/21/20 0129 10/22/20 0242 10/23/20 0805  WBC 14.8* 14.4* 18.9* 14.6* 13.8*  HGB 13.9 13.4 13.8 13.3 13.7  HCT 41.5 39.5 39.3 39.3 40.5  PLT 558* 599* 615* 535* 489*  MCV 97.9 97.3 95.4 96.1 97.1  MCH 32.8 33.0 33.5 32.5 32.9  MCHC 33.5 33.9 35.1 33.8 33.8  RDW 15.0 15.0 15.1 15.3 15.5  LYMPHSABS 0.6*  --   --   --   --   MONOABS 1.0  --   --   --   --   EOSABS 0.0  --   --   --   --   BASOSABS 0.0  --   --   --   --     Recent Labs  Lab 10/19/20 2115 10/20/20 0331 10/21/20 0129 10/22/20 0242 10/23/20 0805  NA 130* 129* 128* 129* 131*  K 4.5 4.5 4.3 4.4 4.8  CL 95* 97* 94* 99 99  CO2 21* 20* 23 21* 23  GLUCOSE 145* 147* 133* 111* 128*  BUN 25* 24* 32* 41* 48*  CREATININE 0.96 0.86 1.15* 1.16* 1.05*  CALCIUM 8.6* 8.6* 8.5* 8.0* 8.4*  AST 165* 154* 197* 358*  --   ALT 103* 101* 133* 224*  --   ALKPHOS 161* 144* 151* 155*  --   BILITOT 1.8* 1.6* 1.5* 1.3*  --   ALBUMIN 2.8* 2.7* 2.4* 2.3*  --   MG  --  2.0  --   --  2.2  PROCALCITON  --  <0.10  --   --   --   BNP 895.8*  --   --  1,542.5* 1,337.4*       Studies: DG Chest Port 1 View  Result Date: 10/23/2020 CLINICAL DATA:  RIGHT pleural effusion. EXAM: PORTABLE CHEST 1 VIEW COMPARISON:  CT 10/20/2020 radiograph FINDINGS: Stable cardiac silhouette. There is some reaccumulation of fluid at the RIGHT lung base. Mild airspace disease in the RIGHT lower lobe. No pneumothorax. Small LEFT effusion similar. IMPRESSION: 1. Reaccumulation of small RIGHT effusion following thoracentesis. 2. New airspace disease in the RIGHT  lower lobe suggesting edema or early infection. Electronically Signed   By: Genevive Bi M.D.   On: 10/23/2020 09:14    Scheduled Meds:  apixaban  5 mg Oral BID   citalopram  20 mg Oral QHS   digoxin  0.0625 mg Oral Daily   feeding supplement  237 mL Oral TID BM   metoprolol tartrate  12.5 mg Oral BID   multivitamin with minerals  1 tablet Oral Daily   pantoprazole  40 mg Oral Daily    Continuous Infusions:  diltiazem (CARDIZEM) infusion 5 mg/hr (10/22/20 1332)     LOS: 4 days   Signature  Susa Raring M.D on 10/23/2020 at 9:57 AM   -  To page go to [email or web address]

## 2020-10-23 NOTE — Evaluation (Addendum)
Occupational Therapy Treatment Patient Details Name: Laurie James MRN: [id number] DOB: [birth year] Today's Date: 10/23/2020    History of Present Illness 75 y.o. F admitted to [facility] due to worsening SOB and increased weakness. Work-up reveals pneumonia and bilateral pleural effusions, right greater than left. Her prior medical history is significant for newly diagnosed A. fib, COPD, hypertension, hyperlipidemia, chronic anxiety/depression, GERD.   Clinical Impression   Pt reported feeling week but agreed to session. Pt was able to complete bed mobility with HOB elevated and bed rails with supervision. Pt then completed sit to stand transfer with supervision to min guard and RW and side step to [facility]. Pt required min to moderate assist with LE clothing management and set up for peri cleaning. Pt then completed side stepping to chair using RW and supervision to min guard. Pt agreed to try to sit up for a little bit of time. It was noted that HR ranged from 101-128 with standing tasks. Pt currently with functional limitations due to the deficits listed below (see OT Problem List).  Pt will benefit from skilled OT to increase their safety and independence with ADL and functional mobility for ADL to facilitate discharge to venue listed below.      Follow Up Recommendations  Home health OT;Supervision/Assistance - 24 hour;Other (comment) (Pt would benifit from SNF but in last PT note declined)    Equipment Recommendations       Recommendations for Other Services       Precautions / Restrictions Precautions Precautions: Fall Precaution Comments: tachycardia Restrictions Weight Bearing Restrictions: No      Mobility Bed Mobility Overal bed mobility: Needs Assistance Bed Mobility: Supine to Sit Rolling: Supervision   Supine to sit: Supervision          Transfers Overall transfer level: Needs assistance Equipment used: Rolling walker (2 wheeled) Transfers: Sit to/from  Stand Sit to Stand: Supervision         General transfer comment: pt requires increase in time    Balance Overall balance assessment: Needs assistance Sitting-balance support: No upper extremity supported;Feet supported Sitting balance-Leahy Scale: Good                                     ADL either performed or assessed with clinical judgement   ADL Overall ADL's : Needs assistance/impaired Eating/Feeding: Set up;Sitting   Grooming: Set up;Sitting   Upper Body Bathing: Min guard;Sitting   Lower Body Bathing: Moderate assistance;Sitting/lateral leans;Sit to/from stand   Upper Body Dressing : Min guard;Sitting   Lower Body Dressing: Min guard;Cueing for safety;Cueing for sequencing;Sit to/from stand   Toilet Transfer: Supervision/safety;Cueing for safety;Cueing for sequencing;RW;BSC   Toileting- Architect and Hygiene: Min guard;Sit to/from stand         General ADL Comments: Pt feeling very weak but agreeded to transfer to [facility] and to chair to increase in sitting tolerance     Vision   Vision Assessment?: No apparent visual deficits     Perception     Praxis      Pertinent Vitals/Pain Pain Assessment: Faces Faces Pain Scale: Hurts a little bit Pain Location: stomache Pain Descriptors / Indicators: Discomfort Pain Intervention(s): Limited activity within patient's tolerance     Hand Dominance     Extremity/Trunk Assessment Upper Extremity Assessment Upper Extremity Assessment: Generalized weakness   Lower Extremity Assessment Lower Extremity Assessment: Generalized weakness  Communication     Cognition Arousal/Alertness: Awake/alert Behavior During Therapy: WFL for tasks assessed/performed Overall Cognitive Status: Within Functional Limits for tasks assessed                                     General Comments       Exercises     Shoulder Instructions      Home Living                                           Prior Functioning/Environment                   OT Problem List:        OT Treatment/Interventions:      OT Goals(Current goals can be found in the care plan section) Acute Rehab OT Goals Patient Stated Goal: to feel stronger OT Goal Formulation: With patient/family Time For Goal Achievement: 11/04/20 Potential to Achieve Goals: Good ADL Goals Pt Will Perform Grooming: with modified independence;standing Pt Will Transfer to Toilet: with supervision;ambulating Pt/caregiver will Perform Home Exercise Program: Increased strength;Both right and left upper extremity;Independently;With written HEP provided Additional ADL Goal #1: Pt will tolerate OOB activities for 5 mins to improve activity tolerance.  OT Frequency: Min 2X/week   Barriers to D/C:            Co-evaluation              AM-PAC OT "6 Clicks" Daily Activity     Outcome Measure Help from another person eating meals?: A Little Help from another person taking care of personal grooming?: A Little Help from another person toileting, which includes using toliet, bedpan, or urinal?: A Little Help from another person bathing (including washing, rinsing, drying)?: A Little Help from another person to put on and taking off regular upper body clothing?: A Little Help from another person to put on and taking off regular lower body clothing?: A Little 6 Click Score: 18   End of Session Equipment Utilized During Treatment: Gait belt;Rolling walker Nurse Communication: Mobility status  Activity Tolerance: Patient limited by fatigue Patient left: in chair;with chair alarm set;with call bell/phone within reach;with family/visitor present (son)  OT Visit Diagnosis: Unsteadiness on feet (R26.81);Other abnormalities of gait and mobility (R26.89);Muscle weakness (generalized) (M62.81)                Time: 4562-5638 OT Time Calculation (min): 28 min Charges:  OT General Charges $OT  Visit: 1 Visit Self Care/Home management 23--37 $OT Eval Low Complexity: 1 Low  Alphia Moh OTR/L  Acute Rehab Services  [phone] office number [phone] pager number   Alphia Moh 10/23/2020, 1:11 PM

## 2020-10-23 NOTE — Progress Notes (Signed)
SLP Cancellation Note  Patient Details Name: Laurie James MRN: [id number] DOB: September 25, 1945   Cancelled treatment:       Reason Eval/Treat Not Completed: Patient at procedure or test/unavailable. SLP to f/u.   Avie Echevaria, MA, CCC-SLP Acute Rehabilitation Services Office Number: [phone]   Paulette Blanch 10/23/2020, 2:42 PM

## 2020-10-23 NOTE — Progress Notes (Signed)
Progress Note  Patient Name: Laurie James Date of Encounter: 10/23/2020  [facility] Cardiologist: Charlton Haws, MD   Subjective   NAEO Remains in AF with V rates 90s-110s  Inpatient Medications    Scheduled Meds:  apixaban  5 mg Oral BID   citalopram  20 mg Oral QHS   digoxin  0.0625 mg Oral Daily   feeding supplement  237 mL Oral TID BM   metoprolol tartrate  12.5 mg Oral BID   multivitamin with minerals  1 tablet Oral Daily   pantoprazole  40 mg Oral Daily   Continuous Infusions:  diltiazem (CARDIZEM) infusion 5 mg/hr (10/22/20 1332)   PRN Meds: lidocaine (PF), melatonin, metoprolol tartrate, ondansetron (ZOFRAN) IV, polyethylene glycol   Vital Signs    Vitals:   10/22/20 2047 10/23/20 0014 10/23/20 0451 10/23/20 0818  BP: (!) 111/55 111/74 (!) 117/94 116/84  Pulse: 62 64 (!) 51 94  Resp: 18 18 17 18   Temp: 97.8 F (36.6 C) (!) 97.4 F (36.3 C) (!) 97.1 F (36.2 C) 97.7 F (36.5 C)  TempSrc: Axillary Axillary Axillary Axillary  SpO2: 94% 95% 97% 95%  Weight:   49.1 kg   Height:        Intake/Output Summary (Last 24 hours) at 10/23/2020 0935 Last data filed at 10/22/2020 1500 Gross per 24 hour  Intake 382.11 ml  Output --  Net 382.11 ml   Last 3 Weights 10/23/2020 10/22/2020 10/21/2020  Weight (lbs) 108 lb 3.9 oz 103 lb 11.2 oz 105 lb 13.1 oz  Weight (kg) 49.1 kg 47.038 kg 48 kg      Telemetry    AF with V rates 90s-110s - Personally Reviewed  ECG    Personally Reviewed  Physical Exam   GEN: No acute distress.  Elderly. Neck: JVD to mid neck Cardiac: irregularly irregular. II/VI murmur at the LLSB. Respiratory: Clear to auscultation bilaterally. GI: Soft, nontender, non-distended  MS: No edema; No deformity. Neuro:  Nonfocal  Psych: Normal affect   Labs    High Sensitivity Troponin:   Recent Labs  Lab 10/05/20 1347 10/05/20 1713 10/19/20 2115 10/20/20 0043  TROPONINIHS 14 16 25* 30*      Chemistry Recent Labs   Lab 10/20/20 0331 10/21/20 0129 10/22/20 0242 10/23/20 0805  NA 129* 128* 129* 131*  K 4.5 4.3 4.4 4.8  CL 97* 94* 99 99  CO2 20* 23 21* 23  GLUCOSE 147* 133* 111* 128*  BUN 24* 32* 41* 48*  CREATININE 0.86 1.15* 1.16* 1.05*  CALCIUM 8.6* 8.5* 8.0* 8.4*  PROT 6.5 6.0* 5.8*  --   ALBUMIN 2.7* 2.4* 2.3*  --   AST 154* 197* 358*  --   ALT 101* 133* 224*  --   ALKPHOS 144* 151* 155*  --   BILITOT 1.6* 1.5* 1.3*  --   GFRNONAA >60 50* 49* 55*  ANIONGAP 12 11 9 9      Hematology Recent Labs  Lab 10/21/20 0129 10/22/20 0242 10/23/20 0805  WBC 18.9* 14.6* 13.8*  RBC 4.12 4.09 4.17  HGB 13.8 13.3 13.7  HCT 39.3 39.3 40.5  MCV 95.4 96.1 97.1  MCH 33.5 32.5 32.9  MCHC 35.1 33.8 33.8  RDW 15.1 15.3 15.5  PLT 615* 535* 489*    BNP Recent Labs  Lab 10/19/20 2115 10/22/20 0242 10/23/20 0805  BNP 895.8* 1,542.5* 1,337.4*     DDimer No results for input(s): DDIMER in the last 168 hours.   Radiology  DG Chest Port 1 View  Result Date: 10/23/2020 CLINICAL DATA:  RIGHT pleural effusion. EXAM: PORTABLE CHEST 1 VIEW COMPARISON:  CT 10/20/2020 radiograph FINDINGS: Stable cardiac silhouette. There is some reaccumulation of fluid at the RIGHT lung base. Mild airspace disease in the RIGHT lower lobe. No pneumothorax. Small LEFT effusion similar. IMPRESSION: 1. Reaccumulation of small RIGHT effusion following thoracentesis. 2. New airspace disease in the RIGHT lower lobe suggesting edema or early infection. Electronically Signed   By: Genevive Bi M.D.   On: 10/23/2020 09:14   DG Abd Portable 1V  Result Date: 10/21/2020 CLINICAL DATA:  Ileus EXAM: PORTABLE ABDOMEN - 1 VIEW COMPARISON:  None. FINDINGS: Interstitial and patchy airspace disease in the right lung base with small bilateral pleural effusions noted. Mild gaseous distention of the stomach. There is mild diffuse gaseous distention of small bowel and colon in a nonobstructive pattern. Thoracolumbar scoliosis evident.  Telemetry leads overlie the chest. IMPRESSION: 1. Nonobstructive bowel gas pattern with mild gaseous distention of the stomach and colon. Component of mild ileus not excluded. 2. Patchy interstitial and airspace disease at the right lung base with small bilateral pleural effusions. Electronically Signed   By: Kennith Center M.D.   On: 10/21/2020 13:51    Cardiac Studies   No new   Assessment & Plan    75yo woman with acute on chronic diastolic HF and AF w/ RVR and pulm HTN admitted with volume overload and AF w/ RVR.  #Acute on chronic diastolic HF Poor PO intake. Volume status slightly up  #AF w/ RVR - cont eliquis - TEE/DCCV scheduled for Monday - cardizem gtt ongoing for rate control - not a good candidate for amiodarone given lung disease. Given her renal dysfunction, I do not think dofetilide is a good option   For questions or updates, please contact [facility] Please consult [email or web address] for contact info under        Signed, Lanier Prude, MD  10/23/2020, 9:35 AM

## 2020-10-24 ENCOUNTER — Inpatient Hospital Stay (HOSPITAL_COMMUNITY): Payer: Medicare Other

## 2020-10-24 DIAGNOSIS — I4821 Permanent atrial fibrillation: Secondary | ICD-10-CM | POA: Diagnosis not present

## 2020-10-24 DIAGNOSIS — I2510 Atherosclerotic heart disease of native coronary artery without angina pectoris: Secondary | ICD-10-CM | POA: Diagnosis not present

## 2020-10-24 DIAGNOSIS — I5033 Acute on chronic diastolic (congestive) heart failure: Secondary | ICD-10-CM | POA: Diagnosis not present

## 2020-10-24 DIAGNOSIS — J90 Pleural effusion, not elsewhere classified: Secondary | ICD-10-CM | POA: Diagnosis not present

## 2020-10-24 LAB — CBC WITH DIFFERENTIAL/PLATELET
Abs Immature Granulocytes: 0.07 10*3/uL (ref 0.00–0.07)
Basophils Absolute: 0 10*3/uL (ref 0.0–0.1)
Basophils Relative: 0 %
Eosinophils Absolute: 0.1 10*3/uL (ref 0.0–0.5)
Eosinophils Relative: 1 %
HCT: 39.7 % (ref 36.0–46.0)
Hemoglobin: 13.4 g/dL (ref 12.0–15.0)
Immature Granulocytes: 1 %
Lymphocytes Relative: 5 %
Lymphs Abs: 0.7 10*3/uL (ref 0.7–4.0)
MCH: 32.5 pg (ref 26.0–34.0)
MCHC: 33.8 g/dL (ref 30.0–36.0)
MCV: 96.4 fL (ref 80.0–100.0)
Monocytes Absolute: 0.9 10*3/uL (ref 0.1–1.0)
Monocytes Relative: 7 %
Neutro Abs: 11.5 10*3/uL — ABNORMAL HIGH (ref 1.7–7.7)
Neutrophils Relative %: 86 %
Platelets: 470 10*3/uL — ABNORMAL HIGH (ref 150–400)
RBC: 4.12 MIL/uL (ref 3.87–5.11)
RDW: 15.5 % (ref 11.5–15.5)
WBC: 13.1 10*3/uL — ABNORMAL HIGH (ref 4.0–10.5)
nRBC: 0 % (ref 0.0–0.2)

## 2020-10-24 LAB — COMPREHENSIVE METABOLIC PANEL
ALT: 472 U/L — ABNORMAL HIGH (ref 0–44)
AST: 682 U/L — ABNORMAL HIGH (ref 15–41)
Albumin: 2.2 g/dL — ABNORMAL LOW (ref 3.5–5.0)
Alkaline Phosphatase: 188 U/L — ABNORMAL HIGH (ref 38–126)
Anion gap: 8 (ref 5–15)
BUN: 49 mg/dL — ABNORMAL HIGH (ref 8–23)
CO2: 23 mmol/L (ref 22–32)
Calcium: 8.1 mg/dL — ABNORMAL LOW (ref 8.9–10.3)
Chloride: 97 mmol/L — ABNORMAL LOW (ref 98–111)
Creatinine, Ser: 0.98 mg/dL (ref 0.44–1.00)
GFR, Estimated: >60 mL/min (ref >60)
Glucose, Bld: 118 mg/dL — ABNORMAL HIGH (ref 70–99)
Potassium: 4.3 mmol/L (ref 3.5–5.1)
Sodium: 128 mmol/L — ABNORMAL LOW (ref 135–145)
Total Bilirubin: 1.3 mg/dL — ABNORMAL HIGH (ref 0.3–1.2)
Total Protein: 5.8 g/dL — ABNORMAL LOW (ref 6.5–8.1)

## 2020-10-24 LAB — BRAIN NATRIURETIC PEPTIDE: B Natriuretic Peptide: 1340.8 pg/mL — ABNORMAL HIGH (ref 0.0–100.0)

## 2020-10-24 LAB — PROCALCITONIN: Procalcitonin: 0.19 ng/mL

## 2020-10-24 LAB — PROTIME-INR
INR: 4.8 (ref 0.8–1.2)
Prothrombin Time: 45 seconds — ABNORMAL HIGH (ref 11.4–15.2)

## 2020-10-24 LAB — APTT: aPTT: 52 seconds — ABNORMAL HIGH (ref 24–36)

## 2020-10-24 LAB — TSH: TSH: 3.795 u[IU]/mL (ref 0.350–4.500)

## 2020-10-24 LAB — HEPARIN LEVEL (UNFRACTIONATED): Heparin Unfractionated: >1.1 IU/mL — ABNORMAL HIGH (ref 0.30–0.70)

## 2020-10-24 LAB — MAGNESIUM: Magnesium: 2.2 mg/dL (ref 1.7–2.4)

## 2020-10-24 MED ORDER — HEPARIN (PORCINE) 25000 UT/250ML-% IV SOLN
800.0000 [IU]/h | INTRAVENOUS | Status: DC
  Administered 2020-10-24: 650 [IU]/h via INTRAVENOUS
  Filled 2020-10-24: qty 250

## 2020-10-24 MED ORDER — SODIUM CHLORIDE 0.9 % IV SOLN: INTRAVENOUS | Status: DC

## 2020-10-24 MED ORDER — TECHNETIUM TC 99M MEBROFENIN IV KIT
5.5000 | PACK | Freq: Once | INTRAVENOUS | Status: AC | PRN
  Administered 2020-10-24: 5.5 via INTRAVENOUS

## 2020-10-24 MED ORDER — METOPROLOL TARTRATE 25 MG PO TABS
25.0000 mg | ORAL_TABLET | Freq: Two times a day (BID) | ORAL | Status: DC
  Filled 2020-10-24: qty 1

## 2020-10-24 MED ORDER — METOPROLOL TARTRATE 25 MG PO TABS
25.0000 mg | ORAL_TABLET | Freq: Two times a day (BID) | ORAL | Status: DC
  Administered 2020-10-24 – 2020-10-26 (×5): 25 mg via ORAL
  Filled 2020-10-24 (×5): qty 1

## 2020-10-24 NOTE — Progress Notes (Signed)
ANTICOAGULATION CONSULT NOTE - Initial Consult  Pharmacy Consult for heparin Indication: atrial fibrillation  Allergies  Allergen Reactions   Codeine Nausea And Vomiting    Patient Measurements: Height: 4\' 8"  (142.2 cm) Weight: 46 kg (101 lb 8 oz) IBW/kg (Calculated) : 36.3 Heparin Dosing Weight: 46 kg  Vital Signs: Temp: 97.8 F (36.6 C) (07/17 0411) Temp Source: Axillary (07/17 0411) BP: 109/71 (07/17 0411) Pulse Rate: 109 (07/17 0411)  Labs: Recent Labs    10/22/20 0242 10/23/20 0805 10/24/20 0134  HGB 13.3 13.7 13.4  HCT 39.3 40.5 39.7  PLT 535* 489* 470*  CREATININE 1.16* 1.05* 0.98    Estimated Creatinine Clearance: 31.5 mL/min (by C-G formula based on SCr of 0.98 mg/dL).   Medical History: Past Medical History:  Diagnosis Date   AMD (age related macular degeneration)    COPD (chronic obstructive pulmonary disease) (HCC)    Depression    Hiatal hernia    Intermittent low back pain    Memory impairment    Midsternal chest pain    a. 12/2011 Cardiac CTA Ca++ score of 103.3 (80th %), LAD <50p/m, RCA 50-75.   Osteoporosis    Vitamin B 12 deficiency     Assessment: 75yo female, on apix PTA for Afib, last dose 7/16 2100, now being held for suspected cholecystitis. DCCV planned for 7/18. Pharmacy consult for heparin.   Goal of Therapy:  Heparin level 0.3-0.7 units/ml aPTT 66-102 seconds Monitor platelets by anticoagulation protocol: Yes   Plan:  Start heparin at 650 units/hr. Check aPTT in ~8 hrs.  Will follow aPTT until correlates with HL.  Monitor for signs and symptoms of bleeding.  Monitor daily HL/aPTT/CBC.  F/u restart apix.   8/18, PharmD PGY1 Resident Phone [phone] 10/24/2020 8:15 AM

## 2020-10-24 NOTE — Progress Notes (Addendum)
PROGRESS NOTE  Laurie James Laurie James JSE:[id number] DOB: May 31, 1945 DOA: 10/19/2020 PCP: Georgann Housekeeper, MD  HPI/Recap of past 24 hours: 75 y.o. female with medical history significant for recent hospitalization and discharge a week ago with newly diagnosed A. fib, plus history of COPD, hypertension presented to the ED on 7/12 with weakness, shortness of breath, cough and found to have pneumonia ane bilateral pleural effusions.  Started on IV antibiotics and admitted to the hospitalist service. Associated with generalized weakness, semi productive cough, chills, poor oral intake.  Following admission, interventional radiology removed 1.7 L of fluid from right lung by thoracentesis.  Patient's breathing improved.  Blood pressure became quite soft on 7/14.  Patient complaining of feeling very weak and nauseated.  Also developed some rapid atrial fibrillation.  He subsequently received IV fluids.  Cardiology following.  Monitor.   Subjective.  Patient in bed, appears comfortable, denies any headache, no fever, no chest pain or pressure, no shortness of breath , no abdominal pain. No focal weakness.  Assessment/Plan:   Acute hypoxic respiratory failure causing shortness of breath due to bilateral pleural effusions/Acute on chronic diastolic CHF EF on 10/06/2020 shows a EF of 60%.  Status postthoracentesis with 1.7 L removed, currently on room air and not short of breath.  Monitor closely cardiology on board.  Paroxysmal A. fib [place] vas 2 score of greater than 3.  Currently on Cardizem drip along with digoxin, continue anticoagulation switch to heparin drip on 10/24/2020 due to possibility of cholecystitis, cardiology on board, undergoing TEE with cardioversion on Monday.  Will check TSH.  CAD  Stable during this hospitalization.  Transaminitis with possible cholecystitis.  Trend going up, right upper quadrant ultrasound suggestive of cholecystitis, HIDA with EF ordered on 10/24/2020.  We will  get general surgery consult and place her on Unasyn.  According to the patient she has had intermittent right upper quadrant discomfort for a little while in the past.  Eliquis last dose 10/23/2020 currently on heparin drip.  HCAP (healthcare-associated pneumonia): Sepsis was ruled out likely cause of leukocytosis was possible cholecystitis above.  Ongoing history of smoking along with underlying history of COPD - she still to quit smoking, supportive care for COPD no acute issues..  Nausea: Improved with supportive care.  No nausea vomiting.  Pulmonary HTN (HCC) - supportive care outpatient follow-up with pulmonary.  HLD (hyperlipidemia) - home dose statin on hold due to transaminitis.    Depression: Stable, cont home meds  Hyponatremia: likely dilutional in the context of acute heart failure diurese as tolerated and monitor.  Incidental finding of possible hydronephrosis on right upper quadrant ultrasound.  Obtain renal ultrasound    Severe protein calorie malnutrition: Seen by nutrition. Nutrition Status: Nutrition Problem: Severe Malnutrition Etiology: chronic illness (COPD, CAD) Signs/Symptoms: severe muscle depletion, severe fat depletion Interventions: Ensure Enlive (each supplement provides 350kcal and 20 grams of protein), Hormel Shake, MVI     Code Status: Full Code   Family Communication: son at bedside 10/23/20  Disposition Plan: anticipate discharge hopefully this weekend once atrial fibrillation controlled   Consultants: Int Radiology  CCS  Procedures: Thoracentesis done 7/13 Right upper quadrant ultrasound with possible cholecystitis HIDA scan with EF ordered 10/24/2020 TEE scheduled for 10/25/2020 Renal ultrasound ordered.  10/24/2020  Antimicrobials: IV Rocephin and Zithromax 7/13-7/14 Unasyn 7/17  DVT prophylaxis:  Eliquis  Level of care: Telemetry Cardiac   Objective: Vitals:   10/24/20 0411 10/24/20 0830  BP: 109/71 114/65  Pulse: (!) 109  (!)  117  Resp: 15 16  Temp: 97.8 F (36.6 C) (!) 96.1 F (35.6 C)  SpO2: 95% 97%    Intake/Output Summary (Last 24 hours) at 10/24/2020 1110 Last data filed at 10/24/2020 0600 Gross per 24 hour  Intake 487.69 ml  Output 536 ml  Net -48.31 ml   Filed Weights   10/22/20 0341 10/23/20 0451 10/24/20 0411  Weight: 47 kg 49.1 kg 46 kg   Body mass index is 22.76 kg/m.  Exam:  Awake Alert, No new F.N deficits, Normal affect [place].AT,PERRAL Supple Neck,No JVD, No cervical lymphadenopathy appriciated.  Symmetrical Chest wall movement, Good air movement bilaterally, CTAB iRRR,No Gallops, Rubs or new Murmurs, No Parasternal Heave +ve B.Sounds, Abd Soft, No tenderness, No organomegaly appriciated, No rebound - guarding or rigidity. No Cyanosis, Clubbing or edema, No new Rash or bruise   Data Reviewed:  Recent Labs  Lab 10/19/20 2115 10/20/20 0331 10/21/20 0129 10/22/20 0242 10/23/20 0805 10/24/20 0134  WBC 14.8* 14.4* 18.9* 14.6* 13.8* 13.1*  HGB 13.9 13.4 13.8 13.3 13.7 13.4  HCT 41.5 39.5 39.3 39.3 40.5 39.7  PLT 558* 599* 615* 535* 489* 470*  MCV 97.9 97.3 95.4 96.1 97.1 96.4  MCH 32.8 33.0 33.5 32.5 32.9 32.5  MCHC 33.5 33.9 35.1 33.8 33.8 33.8  RDW 15.0 15.0 15.1 15.3 15.5 15.5  LYMPHSABS 0.6*  --   --   --   --  0.7  MONOABS 1.0  --   --   --   --  0.9  EOSABS 0.0  --   --   --   --  0.1  BASOSABS 0.0  --   --   --   --  0.0    Recent Labs  Lab 10/19/20 2115 10/20/20 0331 10/21/20 0129 10/22/20 0242 10/23/20 0805 10/24/20 0134  NA 130* 129* 128* 129* 131* 128*  K 4.5 4.5 4.3 4.4 4.8 4.3  CL 95* 97* 94* 99 99 97*  CO2 21* 20* 23 21* 23 23  GLUCOSE 145* 147* 133* 111* 128* 118*  BUN 25* 24* 32* 41* 48* 49*  CREATININE 0.96 0.86 1.15* 1.16* 1.05* 0.98  CALCIUM 8.6* 8.6* 8.5* 8.0* 8.4* 8.1*  AST 165* 154* 197* 358*  --  682*  ALT 103* 101* 133* 224*  --  472*  ALKPHOS 161* 144* 151* 155*  --  188*  BILITOT 1.8* 1.6* 1.5* 1.3*  --  1.3*  ALBUMIN 2.8* 2.7*  2.4* 2.3*  --  2.2*  MG  --  2.0  --   --  2.2 2.2  PROCALCITON  --  <0.10  --   --  0.19 0.19  BNP 895.8*  --   --  1,542.5* 1,337.4* 1,340.8*       Studies: US Abdomen Limited RUQ (LIVER/GB)  Result Date: 10/23/2020 CLINICAL DATA:  Transaminitis EXAM: ULTRASOUND ABDOMEN LIMITED RIGHT UPPER QUADRANT COMPARISON:  10/23/2020, 10/20/2020. FINDINGS: Gallbladder: Gallbladder is moderately distended. Minimal sludge layers dependently within the gallbladder lumen. No evidence of shadowing gallstones. There is borderline gallbladder wall thickening measuring 3 mm. Trace pericholecystic fluid. Negative sonographic Murphy sign. Common bile duct: Diameter: Less than 2 mm. Liver: No focal parenchymal liver abnormalities. Mild nodularity of the liver capsule could reflect an element of underlying cirrhosis. No intrahepatic duct dilation. Portal vein is patent on color Doppler imaging with normal direction of blood flow towards the liver. Other: Right pleural effusion is incidentally noted. Trace free fluid within the right upper quadrant. Limited imaging the right  kidney demonstrates at least moderate hydronephrosis. IMPRESSION: 1. Gallbladder sludge, with borderline gallbladder wall thickening and trace pericholecystic fluid. Findings could represent acute cholecystitis in the appropriate setting. If further evaluation is desired, nuclear medicine hepatobiliary scan could be performed. 2. Moderate right hydronephrosis, incompletely evaluated on this study. 3. Right pleural effusion, unchanged since previous chest x-ray. 4. Slight nodularity of the liver capsule, which may reflect findings of cirrhosis. No focal parenchymal abnormalities. Electronically Signed   By: Sharlet Salina M.D.   On: 10/23/2020 15:59    Scheduled Meds:  citalopram  20 mg Oral QHS   digoxin  0.0625 mg Oral Daily   feeding supplement  237 mL Oral TID BM   metoprolol tartrate  12.5 mg Oral BID   multivitamin with minerals  1 tablet Oral  Daily   pantoprazole  40 mg Oral Daily    Continuous Infusions:  diltiazem (CARDIZEM) infusion 5 mg/hr (10/23/20 1207)   heparin 650 Units/hr (10/24/20 0919)     LOS: 5 days   Signature  Susa Raring M.D on 10/24/2020 at 11:10 AM   -  To page go to [email or web address]

## 2020-10-24 NOTE — Progress Notes (Signed)
Pharmacy Antibiotic Note  Laurie James Laurie James is a 75 y.o. female admitted on 10/19/2020 with  possible cholecystitis .  Pharmacy has been consulted for Unasyn dosing.  Plan: Start Unasyn 3 gm IV q12h Monitor renal fx, CBC, clinical status, and narrow abx as able.   Height: 4\' 8"  (142.2 cm) Weight: 46 kg (101 lb 8 oz) IBW/kg (Calculated) : 36.3  Temp (24hrs), Avg:97 F (36.1 C), Min:96.1 F (35.6 C), Max:97.8 F (36.6 C)  Recent Labs  Lab 10/20/20 0331 10/21/20 0129 10/22/20 0242 10/23/20 0805 10/24/20 0134  WBC 14.4* 18.9* 14.6* 13.8* 13.1*  CREATININE 0.86 1.15* 1.16* 1.05* 0.98    Estimated Creatinine Clearance: 31.5 mL/min (by C-G formula based on SCr of 0.98 mg/dL).    Allergies  Allergen Reactions   Codeine Nausea And Vomiting    Antimicrobials this admission: Azithro 7/13 >> 7/14 CTX 7/13 >> 7/14 Unasyn 7/17 >>    Microbiology results: 7/13 Pleural Cx: ngtd   Thank you for allowing pharmacy to be a part of this patient's care.  8/13, PharmD PGY1 Resident 10/24/2020 12:01 PM

## 2020-10-24 NOTE — Progress Notes (Signed)
SLP Cancellation Note  Patient Details Name: Laurie James MRN: [id number] DOB: 1945-08-08   Cancelled treatment:       Reason Eval/Treat Not Completed: Other (comment) (Patient NPO for test/procedure. SLP will follow up when patient cleared for PO's.)   Angela Nevin, MA, CCC-SLP Speech Therapy [facility] Acute Rehab

## 2020-10-24 NOTE — Plan of Care (Signed)

## 2020-10-24 NOTE — Progress Notes (Signed)
ANTICOAGULATION CONSULT NOTE Pharmacy Consult for heparin Indication: atrial fibrillation  Allergies  Allergen Reactions   Codeine Nausea And Vomiting    Patient Measurements: Height: 4\' 8"  (142.2 cm) Weight: 46 kg (101 lb 8 oz) IBW/kg (Calculated) : 36.3 Heparin Dosing Weight: 46 kg  Vital Signs: Temp: 96.9 F (36.1 C) (07/17 1556) Temp Source: Oral (07/17 1556) BP: 112/98 (07/17 1556) Pulse Rate: 78 (07/17 1556)  Labs: Recent Labs    10/22/20 0242 10/23/20 0805 10/24/20 0134 10/24/20 1611  HGB 13.3 13.7 13.4  --   HCT 39.3 40.5 39.7  --   PLT 535* 489* 470*  --   APTT  --   --   --  52*  LABPROT  --   --   --  45.0*  INR  --   --   --  4.8*  HEPARINUNFRC  --   --   --  >1.10*  CREATININE 1.16* 1.05* 0.98  --      Estimated Creatinine Clearance: 31.5 mL/min (by C-G formula based on SCr of 0.98 mg/dL).   Medical History: Past Medical History:  Diagnosis Date   AMD (age related macular degeneration)    COPD (chronic obstructive pulmonary disease) (HCC)    Depression    Hiatal hernia    Intermittent low back pain    Memory impairment    Midsternal chest pain    a. 12/2011 Cardiac CTA Ca++ score of 103.3 (80th %), LAD <50p/m, RCA 50-75.   Osteoporosis    Vitamin B 12 deficiency     Assessment: 75yo female, on apix PTA for Afib, last dose 7/16 2100, now being held for suspected cholecystitis. DCCV planned for 7/18. Pharmacy consult for heparin.   INR came back elevated at 4.8 - received recent apixaban so would think contributing since apixaban can qualitatively increase INR. Heparin level >1.1 (as expected given recent DOAC), aPTT came back subtherapeutic at 52, on 650 units/hr. No s/sx of bleeding or infusion issues.   Goal of Therapy:  Heparin level 0.3-0.7 units/ml aPTT 66-102 seconds Monitor platelets by anticoagulation protocol: Yes   Plan:  Increase heparin infusion to 800 units/hr. Check aPTT in ~8 hrs with AM labs Will follow aPTT until  correlates with HL.  Monitor for signs and symptoms of bleeding.  Monitor daily HL/aPTT/CBC.  F/u restart apix.   8/18, PharmD, BCCCP Clinical Pharmacist  Phone: [phone] 10/24/2020 7:03 PM  Please check AMION for all [facility] phone numbers After 10:00 PM, call Main Pharmacy [phone]

## 2020-10-24 NOTE — Progress Notes (Addendum)
Progress Note  Patient Name: Laurie James [name] Date of Encounter: 10/24/2020  [facility] Cardiologist: Charlton Haws, MD   Subjective   NAEO AF with V rates 90s-110s. 92 this AM during my visit.  Inpatient Medications    Scheduled Meds:  citalopram  20 mg Oral QHS   digoxin  0.0625 mg Oral Daily   feeding supplement  237 mL Oral TID BM   metoprolol tartrate  12.5 mg Oral BID   multivitamin with minerals  1 tablet Oral Daily   pantoprazole  40 mg Oral Daily   Continuous Infusions:  diltiazem (CARDIZEM) infusion 5 mg/hr (10/23/20 1207)   PRN Meds: lidocaine (PF), melatonin, metoprolol tartrate, ondansetron (ZOFRAN) IV, polyethylene glycol   Vital Signs    Vitals:   10/23/20 0818 10/23/20 1919 10/24/20 0015 10/24/20 0411  BP: 116/84 108/78 116/70 109/71  Pulse: 94 86 88 (!) 109  Resp: 18 17 16 15   Temp: 97.7 F (36.5 C) 97.7 F (36.5 C) (!) 96.5 F (35.8 C) 97.8 F (36.6 C)  TempSrc: Axillary Axillary Axillary Axillary  SpO2: 95% 96% 96% 95%  Weight:    46 kg  Height:        Intake/Output Summary (Last 24 hours) at 10/24/2020 0750 Last data filed at 10/24/2020 0600 Gross per 24 hour  Intake 487.69 ml  Output 536 ml  Net -48.31 ml    Last 3 Weights 10/24/2020 10/23/2020 10/22/2020  Weight (lbs) 101 lb 8 oz 108 lb 3.9 oz 103 lb 11.2 oz  Weight (kg) 46.04 kg 49.1 kg 47.038 kg      Telemetry    AF with V rates 90s-110s - Personally Reviewed  ECG    Personally Reviewed  Physical Exam   GEN: No acute distress.  Elderly. Neck: JVD to mid neck Cardiac: irregularly irregular. II/VI murmur at the LLSB. Respiratory: Clear to auscultation bilaterally. GI: Soft, nontender, non-distended  MS: No edema; No deformity. Neuro:  Nonfocal  Psych: Normal affect   Labs    High Sensitivity Troponin:   Recent Labs  Lab 10/05/20 1347 10/05/20 1713 10/19/20 2115 10/20/20 0043  TROPONINIHS 14 16 25* 30*       Chemistry Recent Labs  Lab  10/21/20 0129 10/22/20 0242 10/23/20 0805 10/24/20 0134  NA 128* 129* 131* 128*  K 4.3 4.4 4.8 4.3  CL 94* 99 99 97*  CO2 23 21* 23 23  GLUCOSE 133* 111* 128* 118*  BUN 32* 41* 48* 49*  CREATININE 1.15* 1.16* 1.05* 0.98  CALCIUM 8.5* 8.0* 8.4* 8.1*  PROT 6.0* 5.8*  --  5.8*  ALBUMIN 2.4* 2.3*  --  2.2*  AST 197* 358*  --  682*  ALT 133* 224*  --  472*  ALKPHOS 151* 155*  --  188*  BILITOT 1.5* 1.3*  --  1.3*  GFRNONAA 50* 49* 55* >60  ANIONGAP 11 9 9 8       Hematology Recent Labs  Lab 10/22/20 0242 10/23/20 0805 10/24/20 0134  WBC 14.6* 13.8* 13.1*  RBC 4.09 4.17 4.12  HGB 13.3 13.7 13.4  HCT 39.3 40.5 39.7  MCV 96.1 97.1 96.4  MCH 32.5 32.9 32.5  MCHC 33.8 33.8 33.8  RDW 15.3 15.5 15.5  PLT 535* 489* 470*     BNP Recent Labs  Lab 10/22/20 0242 10/23/20 0805 10/24/20 0134  BNP 1,542.5* 1,337.4* 1,340.8*      DDimer No results for input(s): DDIMER in the last 168 hours.   Radiology  DG Chest Port 1 View  Result Date: 10/23/2020 CLINICAL DATA:  RIGHT pleural effusion. EXAM: PORTABLE CHEST 1 VIEW COMPARISON:  CT 10/20/2020 radiograph FINDINGS: Stable cardiac silhouette. There is some reaccumulation of fluid at the RIGHT lung base. Mild airspace disease in the RIGHT lower lobe. No pneumothorax. Small LEFT effusion similar. IMPRESSION: 1. Reaccumulation of small RIGHT effusion following thoracentesis. 2. New airspace disease in the RIGHT lower lobe suggesting edema or early infection. Electronically Signed   By: Genevive Bi M.D.   On: 10/23/2020 09:14   US Abdomen Limited RUQ (LIVER/GB)  Result Date: 10/23/2020 CLINICAL DATA:  Transaminitis EXAM: ULTRASOUND ABDOMEN LIMITED RIGHT UPPER QUADRANT COMPARISON:  10/23/2020, 10/20/2020. FINDINGS: Gallbladder: Gallbladder is moderately distended. Minimal sludge layers dependently within the gallbladder lumen. No evidence of shadowing gallstones. There is borderline gallbladder wall thickening measuring 3 mm.  Trace pericholecystic fluid. Negative sonographic Murphy sign. Common bile duct: Diameter: Less than 2 mm. Liver: No focal parenchymal liver abnormalities. Mild nodularity of the liver capsule could reflect an element of underlying cirrhosis. No intrahepatic duct dilation. Portal vein is patent on color Doppler imaging with normal direction of blood flow towards the liver. Other: Right pleural effusion is incidentally noted. Trace free fluid within the right upper quadrant. Limited imaging the right kidney demonstrates at least moderate hydronephrosis. IMPRESSION: 1. Gallbladder sludge, with borderline gallbladder wall thickening and trace pericholecystic fluid. Findings could represent acute cholecystitis in the appropriate setting. If further evaluation is desired, nuclear medicine hepatobiliary scan could be performed. 2. Moderate right hydronephrosis, incompletely evaluated on this study. 3. Right pleural effusion, unchanged since previous chest x-ray. 4. Slight nodularity of the liver capsule, which may reflect findings of cirrhosis. No focal parenchymal abnormalities. Electronically Signed   By: Sharlet Salina M.D.   On: 10/23/2020 15:59    Cardiac Studies   No new   Assessment & Plan    75yo woman with acute on chronic diastolic HF and AF w/ RVR and pulm HTN admitted with volume overload and AF w/ RVR.  #Acute on chronic diastolic HF Poor PO intake. She appears nearly euvolemic  #AF w/ RVR - cont eliquis/heparin gtt - TEE/DCCV planned for early this week as long as additional eval/treatment for transaminitis not needed - cardizem gtt ongoing for rate control. Convert to oral after DCCV. - not a good candidate for amiodarone given lung disease and liver fx. Given her renal dysfunction, I do not think dofetilide is a good option  #Transaminitis  Eval per primary team  For questions or updates, please contact [facility] Please consult [email or web address] for contact info under         Signed, Lanier Prude, MD  10/24/2020, 7:50 AM

## 2020-10-24 NOTE — Consult Note (Signed)
Laurie James [name] 1945/05/11  [id number].    Requesting MD: Dr. Candiss Norse Chief Complaint/Reason for Consult: Elevated lft's with possible cholecystitis   HPI: Laurie James is a 75 y.o. female with a hx of HTN, HLD, COPD, A. fib on Eliquis (last dose 7/16 at 2133) CHF (last echo 6/29 with EF 55-60%, G2 DD) and prior tobacco abuse with 50-pack-year history (quit 1 month ago) who we were asked to see for possible cholecystitis.  Patient presented with shortness of breath on 7/12 and was found to be in A. fib with RVR.  She was admitted on 7/13.  On admission she was noted to have elevated LFTs along with leukocytosis.  CT chest on admission showed right lower lobe infiltrate with large effusion.  She was started on antibiotics to cover for HCAP.  Underwent IR thoracentesis with 1.7 L removed on 7/13.  Cultures NGTD.  Antibiotics continued through 7/15.  Cardiology planning for TEE with cardioversion Monday/early this week however patient noted to have persistently elevated leukocytosis and LFTs.  A RUQ [place] was done to evaluate this that showed mildly distended gallbladder with sludge, no stones.  Gallbladder wall thickness 3 mm with trace pericholecystic fluid.  CBD 2 mm.  Labs today with WBC 13.1, alk phos 188, AST 682, ALT 472, T bili 1.3.  Also noted to have BNP of 1340 with right pleural effusion on ultrasound and x-ray yesterday.  We were asked to see for possible cholecystitis.  Patient denies any current abdominal pain.  She reports that for several years she has had intermittent episodes of "indigestion" and nausea after eating that lasts for 30 minutes and are self-limiting.  She is unable to describe what "indigestion" means in her words but denies pain.  She thought this was due to her chronic use of BC powder and her hiatal hernia.  She reports no acute change in her symptoms.  She believes she may have had 1 of these episodes during admission but again denies any  current abdominal pain or nausea.  She has been tolerating a diet.  She has never been told she had problems with her gallbladder in the past.  She denies any prior abdominal surgeries.  ROS: Review of Systems  Constitutional:  Negative for chills and fever.  Respiratory:  Positive for shortness of breath.   Gastrointestinal:  Negative for abdominal pain, nausea and vomiting.  All other systems reviewed and are negative.  Family History  Problem Relation Age of Onset   Breast cancer Sister    Breast cancer Sister     Past Medical History:  Diagnosis Date   AMD (age related macular degeneration)    COPD (chronic obstructive pulmonary disease) ([place])    Depression    Hiatal hernia    Intermittent low back pain    Memory impairment    Midsternal chest pain    a. 12/2011 Cardiac CTA Ca++ score of 103.3 (80th %), LAD <50p/m, RCA 50-75.   Osteoporosis    Vitamin B 12 deficiency     Past Surgical History:  Procedure Laterality Date   ABDOMINAL HYSTERECTOMY     IR THORACENTESIS ASP PLEURAL SPACE W/IMG GUIDE  10/20/2020   RIGHT/LEFT HEART CATH AND CORONARY ANGIOGRAPHY N/A 10/07/2020   Procedure: RIGHT/LEFT HEART CATH AND CORONARY ANGIOGRAPHY;  Surgeon: Lorretta Harp, MD;  Location: [facility];  Service: Cardiovascular;  Laterality: N/A;    Social History:  reports that she has been smoking cigarettes.  She has a 50.00 pack-year smoking history. She has never used smokeless tobacco. She reports that she does not drink alcohol and does not use drugs. Patient quit smoking approximately 1 month ago.  She has a 50-pack-year history No alcohol use or illicit drug use Lives at home with her husband Walks without assistive devices  Allergies:  Allergies  Allergen Reactions   Codeine Nausea And Vomiting    Medications Prior to Admission  Medication Sig Dispense Refill   albuterol (VENTOLIN HFA) 108 (90 Base) MCG/ACT inhaler Inhale 2 puffs into the lungs every 4 (four) hours as  needed for wheezing.     apixaban (ELIQUIS) 5 MG TABS tablet Take 1 tablet (5 mg total) by mouth 2 (two) times daily. 120 tablet 2   atorvastatin (LIPITOR) 40 MG tablet Take 1 tablet (40 mg total) by mouth daily. 60 tablet 2   citalopram (CELEXA) 20 MG tablet Take 20 mg by mouth at bedtime.     cyanocobalamin (,VITAMIN B-12,) 1000 MCG/ML injection Inject 1,000 mcg into the muscle every 30 (thirty) days.  3   digoxin (LANOXIN) 0.125 MG tablet Take 1 tablet (0.125 mg total) by mouth daily. 60 tablet 2   diltiazem (CARDIZEM CD) 180 MG 24 hr capsule Take 1 capsule (180 mg total) by mouth daily. 60 capsule 2   esomeprazole (NEXIUM) 40 MG capsule Take 40 mg by mouth daily.     metoprolol tartrate (LOPRESSOR) 25 MG tablet Take 0.5 tablets (12.5 mg total) by mouth 2 (two) times daily. 120 tablet 2   nitroGLYCERIN (NITROSTAT) 0.4 MG SL tablet Place 1 tablet (0.4 mg total) under the tongue every 5 (five) minutes x 3 doses as needed for chest pain. 25 tablet 0   promethazine (PHENERGAN) 25 MG tablet Take 25 mg by mouth 3 (three) times daily as needed for nausea.  1     Physical Exam: Blood pressure 114/65, pulse (!) 117, temperature (!) 96.1 F (35.6 C), temperature source Axillary, resp. rate 16, height _0  (1.422 m), weight 46 kg, SpO2 97 %. General: Thin elderly female who appears stated age who is laying in bed in NAD HEENT: head is normocephalic, atraumatic.  Sclera are noninjected.  PERRL.  Ears and nose without any masses or lesions.  Mouth is pink and moist. Dentition fair Heart: Irregularly irregular without obvious murmurs, gallops, or rubs noted.  Palpable pedal pulses bilaterally  Lungs: Distant breath sounds at the right lung base. Otherwise CTA b/l without wheezes, rhonchi, or rales noted.  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, [place], [place] [address]  Basic metabolic panel     Status: Abnormal   Collection Time: 10/23/20  8:05 AM  Result Value Ref Range   Sodium 131 (L) 135 - 145 mmol/L   Potassium 4.8 3.5 - 5.1 mmol/L   Chloride 99 98 - 111 mmol/L   CO2 23 22 - 32 mmol/L   Glucose, Bld 128 (H) 70 - 99 mg/dL    Comment: Glucose reference range applies only to samples taken after fasting for at least 8 hours.   BUN 48 (H) 8 - 23 mg/dL   Creatinine, Ser 1.05 (H) 0.44 - 1.00 mg/dL   Calcium 8.4 (L) 8.9 - 10.3 mg/dL   GFR, Estimated 55 (L) >60 mL/min    Comment: (NOTE) Calculated using the CKD-EPI Creatinine Equation (2021)    Anion gap 9 5 - 15    Comment: Performed at [facility] [address] [place], [place] [address]  CBC     Status: Abnormal   Collection Time: 10/23/20  8:05 AM  Result Value Ref Range   WBC 13.8 (H) 4.0 - 10.5 K/uL   RBC 4.17 3.87 - 5.11 MIL/uL   Hemoglobin 13.7 12.0 - 15.0 g/dL   HCT 40.5 36.0 - 46.0 %   MCV 97.1 80.0 - 100.0 fL   MCH 32.9 26.0 - 34.0 pg   MCHC 33.8 30.0 - 36.0 g/dL   RDW 15.5 11.5 - 15.5 %   Platelets 489 (H) 150 - 400 K/uL   nRBC 0.0 0.0 - 0.2 %    Comment: Performed at [facility], [place] [address], [place], [place] [address]  Magnesium     Status: None    Collection Time: 10/23/20  8:05 AM  Result Value Ref Range   Magnesium 2.2 1.7 - 2.4 mg/dL    Comment: Performed at [facility] [address] [place],  [address]  Procalcitonin - Baseline     Status: None   Collection Time: 10/23/20  8:05 AM  Result Value Ref Range   Procalcitonin 0.19 ng/mL    Comment:        Interpretation: PCT (Procalcitonin) <= 0.5 ng/mL: Systemic infection (sepsis) is not likely. Local bacterial infection is possible. (NOTE)       Sepsis PCT Algorithm           Lower Respiratory Tract                                      Infection PCT Algorithm    ----------------------------     ----------------------------         PCT < 0.25 ng/mL                PCT < 0.10 ng/mL          Strongly encourage             Strongly discourage   discontinuation of antibiotics    initiation of antibiotics    ----------------------------     -----------------------------       PCT 0.25 - 0.50 ng/mL            PCT 0.10 - 0.25 ng/mL               OR       >80% decrease in PCT            Discourage initiation of  antibiotics      Encourage discontinuation           of antibiotics    ----------------------------     -----------------------------         PCT >= 0.50 ng/mL              PCT 0.26 - 0.50 ng/mL               AND        <80% decrease in PCT             Encourage initiation of                                             antibiotics       Encourage continuation           of antibiotics    ----------------------------     -----------------------------        PCT >= 0.50 ng/mL                  PCT > 0.50 ng/mL               AND         increase in PCT                  Strongly encourage                                      initiation of antibiotics    Strongly encourage escalation           of antibiotics                                     -----------------------------                                           PCT  <= 0.25 ng/mL                                                 OR                                        > 80% decrease in PCT                                      Discontinue / Do not initiate                                             antibiotics  Performed at [facility], [place] [address] [place], [place] [address]   Magnesium     Status: None   Collection Time:  10/24/20  1:34 AM  Result Value Ref Range   Magnesium 2.2 1.7 - 2.4 mg/dL    Comment: Performed at [facility] [address] [place], [place] [address]  Comprehensive metabolic panel     Status: Abnormal   Collection Time: 10/24/20  1:34 AM  Result Value Ref Range   Sodium 128 (L) 135 - 145 mmol/L   Potassium 4.3 3.5 - 5.1 mmol/L   Chloride 97 (L) 98 - 111 mmol/L   CO2 23 22 - 32 mmol/L   Glucose, Bld 118 (H) 70 - 99 mg/dL    Comment: Glucose reference range applies only to samples taken after fasting for at least 8 hours.   BUN 49 (H) 8 - 23 mg/dL   Creatinine, Ser 0.98 0.44 - 1.00 mg/dL   Calcium 8.1 (L) 8.9 - 10.3 mg/dL   Total Protein 5.8 (L) 6.5 - 8.1 g/dL   Albumin 2.2 (L) 3.5 - 5.0 g/dL   AST 682 (H) 15 - 41 U/L   ALT 472 (H) 0 - 44 U/L   Alkaline Phosphatase 188 (H) 38 - 126 U/L   Total Bilirubin 1.3 (H) 0.3 - 1.2 mg/dL   GFR, Estimated >60 >60 mL/min    Comment: (NOTE) Calculated using the CKD-EPI Creatinine Equation (2021)    Anion gap 8 5 - 15    Comment: Performed at [facility], [place] [address], [place], [place] [address]  Brain natriuretic peptide     Status: Abnormal   Collection Time: 10/24/20  1:34 AM  Result Value Ref Range   B Natriuretic Peptide 1,340.8 (H) 0.0 - 100.0 pg/mL    Comment: Performed at [facility] [address] [place], [place] [address]  CBC with Differential/Platelet     Status: Abnormal   Collection Time: 10/24/20  1:34 AM  Result Value Ref Range   WBC 13.1 (H) 4.0 - 10.5 K/uL   RBC 4.12 3.87 - 5.11 MIL/uL   Hemoglobin 13.4 12.0 - 15.0  g/dL   HCT 39.7 36.0 - 46.0 %   MCV 96.4 80.0 - 100.0 fL   MCH 32.5 26.0 - 34.0 pg   MCHC 33.8 30.0 - 36.0 g/dL   RDW 15.5 11.5 - 15.5 %   Platelets 470 (H) 150 - 400 K/uL   nRBC 0.0 0.0 - 0.2 %   Neutrophils Relative % 86 %   Neutro Abs 11.5 (H) 1.7 - 7.7 K/uL   Lymphocytes Relative 5 %   Lymphs Abs 0.7 0.7 - 4.0 K/uL   Monocytes Relative 7 %   Monocytes Absolute 0.9 0.1 - 1.0 K/uL   Eosinophils Relative 1 %   Eosinophils Absolute 0.1 0.0 - 0.5 K/uL   Basophils Relative 0 %   Basophils Absolute 0.0 0.0 - 0.1 K/uL   Immature Granulocytes 1 %   Abs Immature Granulocytes 0.07 0.00 - 0.07 K/uL    Comment: Performed at [facility] [address], [place], [place] [address]  Procalcitonin     Status: None   Collection Time: 10/24/20  1:34 AM  Result Value Ref Range   Procalcitonin 0.19 ng/mL    Comment:        Interpretation: PCT (Procalcitonin) <= 0.5 ng/mL: Systemic infection (sepsis) is not likely. Local bacterial infection is possible. (NOTE)       Sepsis PCT Algorithm           Lower Respiratory Tract  Infection PCT Algorithm    ----------------------------     ----------------------------         PCT < 0.25 ng/mL                PCT < 0.10 ng/mL          Strongly encourage             Strongly discourage   discontinuation of antibiotics    initiation of antibiotics    ----------------------------     -----------------------------       PCT 0.25 - 0.50 ng/mL            PCT 0.10 - 0.25 ng/mL               OR       >80% decrease in PCT            Discourage initiation of                                            antibiotics      Encourage discontinuation           of antibiotics    ----------------------------     -----------------------------         PCT >= 0.50 ng/mL              PCT 0.26 - 0.50 ng/mL               AND        <80% decrease in PCT             Encourage initiation of                                              antibiotics       Encourage continuation           of antibiotics    ----------------------------     -----------------------------        PCT >= 0.50 ng/mL                  PCT > 0.50 ng/mL               AND         increase in PCT                  Strongly encourage                                      initiation of antibiotics    Strongly encourage escalation           of antibiotics                                     -----------------------------                                           PCT <= 0.25 ng/mL  OR                                        > 80% decrease in PCT                                      Discontinue / Do not initiate                                             antibiotics  Performed at [facility], [place] [address] [place], [place] [address]    DG Chest Port 1 View  Result Date: 10/23/2020 CLINICAL DATA:  RIGHT pleural effusion. EXAM: PORTABLE CHEST 1 VIEW COMPARISON:  CT 10/20/2020 radiograph FINDINGS: Stable cardiac silhouette. There is some reaccumulation of fluid at the RIGHT lung base. Mild airspace disease in the RIGHT lower lobe. No pneumothorax. Small LEFT effusion similar. IMPRESSION: 1. Reaccumulation of small RIGHT effusion following thoracentesis. 2. New airspace disease in the RIGHT lower lobe suggesting edema or early infection. Electronically Signed   By: Suzy Bouchard M.D.   On: 10/23/2020 09:14   US Abdomen Limited RUQ (LIVER/GB)  Result Date: 10/23/2020 CLINICAL DATA:  Transaminitis EXAM: ULTRASOUND ABDOMEN LIMITED RIGHT UPPER QUADRANT COMPARISON:  10/23/2020, 10/20/2020. FINDINGS: Gallbladder: Gallbladder is moderately distended. Minimal sludge layers dependently within the gallbladder lumen. No evidence of shadowing gallstones. There is borderline gallbladder wall thickening measuring 3 mm. Trace pericholecystic fluid. Negative sonographic Murphy sign. Common bile duct: Diameter: Less than 2  mm. Liver: No focal parenchymal liver abnormalities. Mild nodularity of the liver capsule could reflect an element of underlying cirrhosis. No intrahepatic duct dilation. Portal vein is patent on color Doppler imaging with normal direction of blood flow towards the liver. Other: Right pleural effusion is incidentally noted. Trace free fluid within the right upper quadrant. Limited imaging the right kidney demonstrates at least moderate hydronephrosis. IMPRESSION: 1. Gallbladder sludge, with borderline gallbladder wall thickening and trace pericholecystic fluid. Findings could represent acute cholecystitis in the appropriate setting. If further evaluation is desired, nuclear medicine hepatobiliary scan could be performed. 2. Moderate right hydronephrosis, incompletely evaluated on this study. 3. Right pleural effusion, unchanged since previous chest x-ray. 4. Slight nodularity of the liver capsule, which may reflect findings of cirrhosis. No focal parenchymal abnormalities. Electronically Signed   By: Randa Ngo M.D.   On: 10/23/2020 15:59    Anti-infectives (From admission, onward)    Start     Dose/Rate Route Frequency Ordered Stop   10/21/20 1000  cefTRIAXone (ROCEPHIN) 1 g in sodium chloride 0.9 % 100 mL IVPB  Status:  Discontinued        1 g 200 mL/hr over 30 Minutes Intravenous Every 24 hours 10/20/20 1501 10/21/20 1045   10/21/20 0800  azithromycin (ZITHROMAX) 500 mg in sodium chloride 0.9 % 250 mL IVPB  Status:  Discontinued        500 mg 250 mL/hr over 60 Minutes Intravenous Every 24 hours 10/20/20 1501 10/21/20 1045   10/19/20 2315  cefTRIAXone (ROCEPHIN) 1 g in sodium chloride 0.9 % 100 mL IVPB        1 g 200 mL/hr over 30 Minutes Intravenous  Once 10/19/20 2301  10/20/20 0302   10/19/20 2315  azithromycin (ZITHROMAX) 500 mg in sodium chloride 0.9 % 250 mL IVPB        500 mg 250 mL/hr over 60 Minutes Intravenous  Once 10/19/20 2301 10/20/20 0504        Assessment/Plan Possible  Acute Cholecystitis  Laurie James is a 75 y.o. female who presented with A. fib with RVR.  During admission she was noted to have leukocytosis and elevated LFTs.  Leukocytosis initially felt to be related to HCAP but antibiotics have since been discontinued.  She underwent A RUQ [place] that showed sludge, no stones, 39m GB wall thickness with trace pericholecystic fluid.  CBD 2 mm.  Labs today with WBC 13.1, alk phos 188, AST 682, ALT 472, T bili 1.3.  Also noted to have BNP of 1340 with right pleural effusion on ultrasound and x-ray yesterday.  Currently she reports no abdominal pain, nausea or vomiting and was previously tolerating a diet during admission.  It appears she has had several years of intermittent "indigestion" along with nausea after eating that she thought was related to chronic BC powder use and was treating at home with Nexium.  She has no tenderness with light palpation and only mild tenderness in the epigastrium with deep palpation.  Her history, exam and imaging is not concrete for an acute cholecystitis picture.  Her LFTs could be elevated in the setting of hepatic congestion given elevated BNP.  Agree with obtaining HIDA to evaluate for Acute Cholecystitis.  We will following with you.  FEN - NPO for HIDA VTE - SCDs, heparin gtt ID - None currently  A. fib on Eliquis (last dose 7/16 at 2133) - currently on heparin. Cardiology planning on TEE and cardioversion early this week CHF (last echo 6/29 with EF 55-60%, G2 DD)  R pleural effusion - IR thoracentesis with 1.7 L removed on 7/13.  Cultures NGTD. HCAP - abx stopped 7/15 HTN HLD COPD Prior tobacco abuse with 50-pack-year history (quit 1 month ago)   MJillyn Ledger [facility] 10/24/2020, 11:44 AM Please see Amion for pager number during day hours 7:00am-4:30pm

## 2020-10-25 ENCOUNTER — Inpatient Hospital Stay (HOSPITAL_COMMUNITY): Payer: Medicare Other

## 2020-10-25 ENCOUNTER — Encounter (HOSPITAL_COMMUNITY)
Admission: EM | Disposition: A | Discharge status: Home Health | Payer: Self-pay | Source: Home / Self Care | Attending: Internal Medicine

## 2020-10-25 ENCOUNTER — Inpatient Hospital Stay (HOSPITAL_COMMUNITY): Payer: Medicare Other | Admitting: Certified Registered"

## 2020-10-25 ENCOUNTER — Encounter (HOSPITAL_COMMUNITY): Payer: Self-pay | Admitting: Internal Medicine

## 2020-10-25 DIAGNOSIS — J90 Pleural effusion, not elsewhere classified: Secondary | ICD-10-CM | POA: Diagnosis not present

## 2020-10-25 DIAGNOSIS — I2510 Atherosclerotic heart disease of native coronary artery without angina pectoris: Secondary | ICD-10-CM | POA: Diagnosis not present

## 2020-10-25 DIAGNOSIS — I5033 Acute on chronic diastolic (congestive) heart failure: Secondary | ICD-10-CM | POA: Diagnosis not present

## 2020-10-25 DIAGNOSIS — I4891 Unspecified atrial fibrillation: Secondary | ICD-10-CM

## 2020-10-25 DIAGNOSIS — G4733 Obstructive sleep apnea (adult) (pediatric): Secondary | ICD-10-CM

## 2020-10-25 DIAGNOSIS — I4819 Other persistent atrial fibrillation: Secondary | ICD-10-CM | POA: Diagnosis not present

## 2020-10-25 DIAGNOSIS — I4821 Permanent atrial fibrillation: Secondary | ICD-10-CM | POA: Diagnosis not present

## 2020-10-25 HISTORY — PX: TEE WITHOUT CARDIOVERSION: SHX5443

## 2020-10-25 LAB — OSMOLALITY, URINE: Osmolality, Ur: 742 mOsm/kg (ref 300–900)

## 2020-10-25 LAB — CBC WITH DIFFERENTIAL/PLATELET
Abs Immature Granulocytes: 0.06 10*3/uL (ref 0.00–0.07)
Basophils Absolute: 0 10*3/uL (ref 0.0–0.1)
Basophils Relative: 0 %
Eosinophils Absolute: 0.1 10*3/uL (ref 0.0–0.5)
Eosinophils Relative: 1 %
HCT: 41.2 % (ref 36.0–46.0)
Hemoglobin: 14 g/dL (ref 12.0–15.0)
Immature Granulocytes: 1 %
Lymphocytes Relative: 8 %
Lymphs Abs: 1 10*3/uL (ref 0.7–4.0)
MCH: 32.9 pg (ref 26.0–34.0)
MCHC: 34 g/dL (ref 30.0–36.0)
MCV: 96.7 fL (ref 80.0–100.0)
Monocytes Absolute: 1 10*3/uL (ref 0.1–1.0)
Monocytes Relative: 8 %
Neutro Abs: 10.1 10*3/uL — ABNORMAL HIGH (ref 1.7–7.7)
Neutrophils Relative %: 82 %
Platelets: 406 10*3/uL — ABNORMAL HIGH (ref 150–400)
RBC: 4.26 MIL/uL (ref 3.87–5.11)
RDW: 15.6 % — ABNORMAL HIGH (ref 11.5–15.5)
WBC: 12.2 10*3/uL — ABNORMAL HIGH (ref 4.0–10.5)
nRBC: 0 % (ref 0.0–0.2)

## 2020-10-25 LAB — URINALYSIS, ROUTINE W REFLEX MICROSCOPIC
Bilirubin Urine: NEGATIVE
Glucose, UA: NEGATIVE mg/dL
Hgb urine dipstick: NEGATIVE
Ketones, ur: NEGATIVE mg/dL
Leukocytes,Ua: NEGATIVE
Nitrite: NEGATIVE
Protein, ur: 30 mg/dL — AB
Specific Gravity, Urine: 1.025 (ref 1.005–1.030)
pH: 5 (ref 5.0–8.0)

## 2020-10-25 LAB — COMPREHENSIVE METABOLIC PANEL
ALT: 443 U/L — ABNORMAL HIGH (ref 0–44)
AST: 557 U/L — ABNORMAL HIGH (ref 15–41)
Albumin: 2.2 g/dL — ABNORMAL LOW (ref 3.5–5.0)
Alkaline Phosphatase: 210 U/L — ABNORMAL HIGH (ref 38–126)
Anion gap: 12 (ref 5–15)
BUN: 55 mg/dL — ABNORMAL HIGH (ref 8–23)
CO2: 19 mmol/L — ABNORMAL LOW (ref 22–32)
Calcium: 8.4 mg/dL — ABNORMAL LOW (ref 8.9–10.3)
Chloride: 98 mmol/L (ref 98–111)
Creatinine, Ser: 0.97 mg/dL (ref 0.44–1.00)
GFR, Estimated: >60 mL/min (ref >60)
Glucose, Bld: 106 mg/dL — ABNORMAL HIGH (ref 70–99)
Potassium: 4.9 mmol/L (ref 3.5–5.1)
Sodium: 129 mmol/L — ABNORMAL LOW (ref 135–145)
Total Bilirubin: 1.6 mg/dL — ABNORMAL HIGH (ref 0.3–1.2)
Total Protein: 5.9 g/dL — ABNORMAL LOW (ref 6.5–8.1)

## 2020-10-25 LAB — HEPARIN LEVEL (UNFRACTIONATED)
Heparin Unfractionated: >1.1 IU/mL — ABNORMAL HIGH (ref 0.30–0.70)
Heparin Unfractionated: >1.1 IU/mL — ABNORMAL HIGH (ref 0.30–0.70)
Heparin Unfractionated: >1.1 IU/mL — ABNORMAL HIGH (ref 0.30–0.70)

## 2020-10-25 LAB — OSMOLALITY: Osmolality: 297 mOsm/kg — ABNORMAL HIGH (ref 275–295)

## 2020-10-25 LAB — LACTATE DEHYDROGENASE: LDH: 383 U/L — ABNORMAL HIGH (ref 98–192)

## 2020-10-25 LAB — MAGNESIUM: Magnesium: 2.4 mg/dL (ref 1.7–2.4)

## 2020-10-25 LAB — URIC ACID: Uric Acid, Serum: 6.1 mg/dL (ref 2.5–7.1)

## 2020-10-25 LAB — APTT
aPTT: 174 seconds (ref 24–36)
aPTT: >200 seconds (ref 24–36)
aPTT: 118 seconds — ABNORMAL HIGH (ref 24–36)

## 2020-10-25 LAB — PROCALCITONIN: Procalcitonin: 0.24 ng/mL

## 2020-10-25 LAB — BRAIN NATRIURETIC PEPTIDE: B Natriuretic Peptide: 1137.5 pg/mL — ABNORMAL HIGH (ref 0.0–100.0)

## 2020-10-25 SURGERY — ECHOCARDIOGRAM, TRANSESOPHAGEAL
Anesthesia: Monitor Anesthesia Care

## 2020-10-25 MED ORDER — PHENYLEPHRINE 40 MCG/ML (10ML) SYRINGE FOR IV PUSH (FOR BLOOD PRESSURE SUPPORT)
PREFILLED_SYRINGE | INTRAVENOUS | Status: DC | PRN
  Administered 2020-10-25: 80 ug via INTRAVENOUS

## 2020-10-25 MED ORDER — SODIUM CHLORIDE 0.9 % IV SOLN
3.0000 g | Freq: Three times a day (TID) | INTRAVENOUS | Status: DC
  Administered 2020-10-25 – 2020-10-26 (×4): 3 g via INTRAVENOUS
  Filled 2020-10-25 (×7): qty 8

## 2020-10-25 MED ORDER — PROPOFOL 500 MG/50ML IV EMUL
INTRAVENOUS | Status: DC | PRN
  Administered 2020-10-25: 100 ug/kg/min via INTRAVENOUS

## 2020-10-25 MED ORDER — HEPARIN (PORCINE) 25000 UT/250ML-% IV SOLN
650.0000 [IU]/h | INTRAVENOUS | Status: DC
  Administered 2020-10-25 (×2): 700 [IU]/h via INTRAVENOUS
  Administered 2020-10-27: 600 [IU]/h via INTRAVENOUS
  Administered 2020-10-28: 450 [IU]/h via INTRAVENOUS
  Administered 2020-10-30: 600 [IU]/h via INTRAVENOUS
  Filled 2020-10-25 (×4): qty 250

## 2020-10-25 NOTE — Progress Notes (Signed)
Patient's HIDA scan is negative for cholecystitis.  She does have a history of CHF with elevated BNP.  Elevated LFTs may be secondary to fluid overload and overall cholestatic appearance on HIDA. Her cystic duct is patent though indicating no evidence of cholecystitis.  No plans for surgical intervention or any other surgical recommendations.  We will sign off.  Letha Cape 7:48 AM 10/25/2020

## 2020-10-25 NOTE — Progress Notes (Signed)
ANTICOAGULATION CONSULT NOTE Pharmacy Consult for heparin Indication: atrial fibrillation  Allergies  Allergen Reactions   Codeine Nausea And Vomiting    Patient Measurements: Height: 4\' 8"  (142.2 cm) Weight: 46 kg (101 lb 8 oz) IBW/kg (Calculated) : 36.3 Heparin Dosing Weight: 46 kg  Vital Signs: Temp: 96.7 F (35.9 C) (07/17 2353) Temp Source: Axillary (07/17 2353) BP: 101/79 (07/17 2353) Pulse Rate: 65 (07/17 2353)  Labs: Recent Labs    10/23/20 0805 10/24/20 0134 10/24/20 1611 10/25/20 0126  HGB 13.7 13.4  --  14.0  HCT 40.5 39.7  --  41.2  PLT 489* 470*  --  406*  APTT  --   --  52* 174*  LABPROT  --   --  45.0*  --   INR  --   --  4.8*  --   HEPARINUNFRC  --   --  >1.10* >1.10*  CREATININE 1.05* 0.98  --  0.97     Estimated Creatinine Clearance: 31.8 mL/min (by C-G formula based on SCr of 0.97 mg/dL).   Medical History: Past Medical History:  Diagnosis Date   AMD (age related macular degeneration)    COPD (chronic obstructive pulmonary disease) ([facility])    Depression    Hiatal hernia    Intermittent low back pain    Memory impairment    Midsternal chest pain    a. 12/2011 Cardiac CTA Ca++ score of 103.3 (80th %), LAD <50p/m, RCA 50-75.   Osteoporosis    Vitamin B 12 deficiency     Assessment: 75yo female, on apix PTA for Afib, last dose 7/16 2100, now being held for suspected cholecystitis. DCCV planned for 7/18. Pharmacy consult for heparin.   INR came back elevated at 4.8 - received recent apixaban so would think contributing since apixaban can qualitatively increase INR. Heparin level >1.1 (as expected given recent DOAC), aPTT came back subtherapeutic at 52, on 650 units/hr. No s/sx of bleeding or infusion issues.   7/18 AM update:  aPTT elevated Drawn correctly   Goal of Therapy:  Heparin level 0.3-0.7 units/ml aPTT 66-102 seconds Monitor platelets by anticoagulation protocol: Yes   Plan:  Hold heparin x 1 hr Re-start heparin at 700  units/hr 1400 aPTT/heparin level Will follow aPTT until correlates with HL.  Monitor for signs and symptoms of bleeding.  Monitor daily HL/aPTT/CBC.   8/18, PharmD, BCPS Clinical Pharmacist Phone: [phone]

## 2020-10-25 NOTE — Consult Note (Signed)
Referring Provider: Dr. Thedore Mins Primary Care Physician:  Georgann Housekeeper, MD  Reason for Consultation:  Abnormal LFTs  HPI: Laurie James is a 75 y.o. female with past medical history of HTN, COPD, A. fib (on Eliquis) CHF (TEE today 10/25/20 with EF 20-25%)  presenting for consultation of abnormal LFTs.  Patient is currently admitted with acute on chronic CHF with TEE today 10/25/2020 showing EF of 20 to 25%.  She also has acute hypoxic respiratory failure and A. fib.  We were consulted for abnormal LFTs.  RUQ US showed gallbladder sludge with borderline gallbladder wall thickening and trace pericholecystic fluid.  HIDA scan was negative for acute gallbladder disease.  Nonspecific delayed biliary to bowel transit with slight stasis of activity in the CBD.  No biliary ductal dilation.  Patient is hard of hearing and does not currently have her hearing aids in.  Thus, most of history is obtained from daughter at bedside.  Patient denies any current abdominal pain.  However, patient has had intermittent upper abdominal discomfort, which she attributes to heavy use of BC powder.  No recent nausea or vomiting.  No known changes in stool, melena, or hematochezia.  No known family history of liver disease.  No alcohol use.  Past Medical History:  Diagnosis Date   AMD (age related macular degeneration)    COPD (chronic obstructive pulmonary disease) (HCC)    Depression    Hiatal hernia    Intermittent low back pain    Memory impairment    Midsternal chest pain    a. 12/2011 Cardiac CTA Ca++ score of 103.3 (80th %), LAD <50p/m, RCA 50-75.   Osteoporosis    Vitamin B 12 deficiency     Past Surgical History:  Procedure Laterality Date   ABDOMINAL HYSTERECTOMY     IR THORACENTESIS ASP PLEURAL SPACE W/IMG GUIDE  10/20/2020   RIGHT/LEFT HEART CATH AND CORONARY ANGIOGRAPHY N/A 10/07/2020   Procedure: RIGHT/LEFT HEART CATH AND CORONARY ANGIOGRAPHY;  Surgeon: Runell Gess, MD;  Location:  [facility];  Service: Cardiovascular;  Laterality: N/A;    Prior to Admission medications   Medication Sig Start Date End Date Taking? Authorizing Provider  albuterol (VENTOLIN HFA) 108 (90 Base) MCG/ACT inhaler Inhale 2 puffs into the lungs every 4 (four) hours as needed for wheezing. 06/08/20  Yes [provider]  apixaban (ELIQUIS) 5 MG TABS tablet Take 1 tablet (5 mg total) by mouth 2 (two) times daily. 10/11/20  Yes Georgie Chard D, NP  atorvastatin (LIPITOR) 40 MG tablet Take 1 tablet (40 mg total) by mouth daily. 10/12/20  Yes Georgie Chard D, NP  citalopram (CELEXA) 20 MG tablet Take 20 mg by mouth at bedtime. 06/08/20  Yes [provider]  cyanocobalamin (,VITAMIN B-12,) 1000 MCG/ML injection Inject 1,000 mcg into the muscle every 30 (thirty) days. 06/11/16  Yes [provider]  digoxin (LANOXIN) 0.125 MG tablet Take 1 tablet (0.125 mg total) by mouth daily. 10/12/20  Yes Georgie Chard D, NP  diltiazem (CARDIZEM CD) 180 MG 24 hr capsule Take 1 capsule (180 mg total) by mouth daily. 10/12/20  Yes Georgie Chard D, NP  esomeprazole (NEXIUM) 40 MG capsule Take 40 mg by mouth daily.   Yes [provider]  metoprolol tartrate (LOPRESSOR) 25 MG tablet Take 0.5 tablets (12.5 mg total) by mouth 2 (two) times daily. 10/11/20  Yes Georgie Chard D, NP  nitroGLYCERIN (NITROSTAT) 0.4 MG SL tablet Place 1 tablet (0.4 mg total) under the  tongue every 5 (five) minutes x 3 doses as needed for chest pain. 10/11/20  Yes Georgie Chard D, NP  promethazine (PHENERGAN) 25 MG tablet Take 25 mg by mouth 3 (three) times daily as needed for nausea. 04/17/16  Yes [provider]    Scheduled Meds:  citalopram  20 mg Oral QHS   digoxin  0.0625 mg Oral Daily   feeding supplement  237 mL Oral TID BM   metoprolol tartrate  25 mg Oral BID   multivitamin with minerals  1 tablet Oral Daily   pantoprazole  40 mg Oral Daily   Continuous Infusions:  sodium chloride 20 mL/hr at  10/25/20 0607   ampicillin-sulbactam (UNASYN) IV 3 g (10/25/20 0959)   diltiazem (CARDIZEM) infusion 5 mg/hr (10/24/20 1258)   heparin 700 Units/hr (10/25/20 1125)   PRN Meds:.lidocaine (PF), melatonin, metoprolol tartrate, ondansetron (ZOFRAN) IV, polyethylene glycol  Allergies as of 10/19/2020 - Review Complete 10/19/2020  Allergen Reaction Noted   Codeine Nausea And Vomiting 12/21/2011    Family History  Problem Relation Age of Onset   Breast cancer Sister    Breast cancer Sister     Social History   Socioeconomic History   Marital status: Married    Spouse name: Not on file   Number of children: Not on file   Years of education: Not on file   Highest education level: Not on file  Occupational History   Not on file  Tobacco Use   Smoking status: Every Day    Packs/day: 1.00    Years: 50.00    Pack years: 50.00    Types: Cigarettes   Smokeless tobacco: Never  Substance and Sexual Activity   Alcohol use: No    Alcohol/week: 0.0 standard drinks    Comment: rarely   Drug use: No   Sexual activity: Not on file  Other Topics Concern   Not on file  Social History Narrative   Not on file   Social Determinants of Health   Financial Resource Strain: Not on file  Food Insecurity: Not on file  Transportation Needs: Not on file  Physical Activity: Not on file  Stress: Not on file  Social Connections: Not on file  Intimate Partner Violence: Not on file    Review of Systems: Review of Systems  Unable to perform ROS: Other  (hard of hearing)   Physical Exam: Vital signs: Vitals:   10/25/20 0908 10/25/20 1138  BP: 104/65 107/60  Pulse: 87 (!) 48  Resp:  18  Temp:  98.1 F (36.7 C)  SpO2:  97%   Last BM Date: 10/22/20  Physical Exam Vitals reviewed.  Constitutional:      General: She is not in acute distress. HENT:     Head: Normocephalic and atraumatic.     Nose: Nose normal. No congestion.     Mouth/Throat:     Mouth: Mucous membranes are moist.      Pharynx: Oropharynx is clear.  Eyes:     General: No scleral icterus.    Extraocular Movements: Extraocular movements intact.     Conjunctiva/sclera: Conjunctivae normal.  Cardiovascular:     Rate and Rhythm: Normal rate. Rhythm irregular.  Pulmonary:     Effort: Pulmonary effort is normal. No respiratory distress.  Abdominal:     General: Bowel sounds are normal. There is no distension.     Palpations: Abdomen is soft. There is no mass.     Tenderness: There is no abdominal tenderness. There is  no guarding or rebound.     Hernia: No hernia is present.  Musculoskeletal:        General: No swelling or tenderness.     Cervical back: Normal range of motion and neck supple.  Skin:    General: Skin is warm and dry.     Coloration: Skin is not jaundiced.  Neurological:     General: No focal deficit present.     Mental Status: She is oriented to person, place, and time. She is lethargic.  Psychiatric:        Mood and Affect: Mood normal.        Behavior: Behavior normal. Behavior is cooperative.     GI:  Lab Results: Recent Labs    10/23/20 0805 10/24/20 0134 10/25/20 0126  WBC 13.8* 13.1* 12.2*  HGB 13.7 13.4 14.0  HCT 40.5 39.7 41.2  PLT 489* 470* 406*   BMET Recent Labs    10/23/20 0805 10/24/20 0134 10/25/20 0126  NA 131* 128* 129*  K 4.8 4.3 4.9  CL 99 97* 98  CO2 23 23 19*  GLUCOSE 128* 118* 106*  BUN 48* 49* 55*  CREATININE 1.05* 0.98 0.97  CALCIUM 8.4* 8.1* 8.4*   LFT Recent Labs    10/25/20 0126  PROT 5.9*  ALBUMIN 2.2*  AST 557*  ALT 443*  ALKPHOS 210*  BILITOT 1.6*   PT/INR Recent Labs    10/24/20 1611  LABPROT 45.0*  INR 4.8*     Studies/Results: NM Hepatobiliary Liver Func  Result Date: 10/24/2020 CLINICAL DATA:  Abdominal pain nausea EXAM: NUCLEAR MEDICINE HEPATOBILIARY IMAGING TECHNIQUE: Sequential images of the abdomen were obtained out to 60 minutes following intravenous administration of radiopharmaceutical.  RADIOPHARMACEUTICALS:  5.5 mCi Tc-35m  Choletec IV COMPARISON:  Ultrasound 10/23/2020 FINDINGS: Prompt uptake and biliary excretion of activity by the liver is seen. Gallbladder activity is visualized, consistent with patency of cystic duct. Delayed visualization of common bile duct activity and delayed biliary to bowel transit which is visualized by end of the second hour of imaging. IMPRESSION: 1. Negative for acute gallbladder disease. 2. Nonspecific delayed biliary to bowel transit with slight stasis of activity in the common bile duct, no ductal dilatation noted on prior ultrasound. If there is concern for ductal obstruction, correlation with MRCP could be obtained. Electronically Signed   By: Jasmine Pang M.D.   On: 10/24/2020 19:32   US RENAL  Result Date: 10/24/2020 CLINICAL DATA:  Acute renal insufficiency EXAM: RENAL / URINARY TRACT ULTRASOUND COMPLETE COMPARISON:  Abdominal ultrasound October 23, 2020 FINDINGS: Right Kidney: Renal measurements: 9.7 x 4.0 x 4.5 cm = volume: 91 mL. Mild to moderate hydronephrosis, unchanged in the interval. Left Kidney: Renal measurements: 7.2 x 4.8 x 5.3 cm = volume: 96 mL. Echogenicity within normal limits. No mass or hydronephrosis visualized. Bladder: Appears normal for degree of bladder distention. Other: There is a left pleural effusion. IMPRESSION: 1. Moderate right hydronephrosis of uncertain chronicity and etiology is again identified, unchanged since yesterday's study. 2. Left pleural effusion. Electronically Signed   By: Gerome Sam III M.D   On: 10/24/2020 13:47   DG Chest Port 1 View  Result Date: 10/25/2020 CLINICAL DATA:  Shortness of breath EXAM: PORTABLE CHEST 1 VIEW COMPARISON:  10/23/2020 FINDINGS: Cardiac shadow is within normal limits. Aortic calcifications are again seen. Increasing right-sided effusion and basilar opacity is seen. Stable left effusion is noted. No pneumothorax is seen. No bony abnormality is noted. IMPRESSION: Increasing  right-sided pleural  effusion with underlying basilar opacity. Stable small left effusion. Electronically Signed   By: Alcide Clever M.D.   On: 10/25/2020 11:59   US Abdomen Limited RUQ (LIVER/GB)  Result Date: 10/23/2020 CLINICAL DATA:  Transaminitis EXAM: ULTRASOUND ABDOMEN LIMITED RIGHT UPPER QUADRANT COMPARISON:  10/23/2020, 10/20/2020. FINDINGS: Gallbladder: Gallbladder is moderately distended. Minimal sludge layers dependently within the gallbladder lumen. No evidence of shadowing gallstones. There is borderline gallbladder wall thickening measuring 3 mm. Trace pericholecystic fluid. Negative sonographic Murphy sign. Common bile duct: Diameter: Less than 2 mm. Liver: No focal parenchymal liver abnormalities. Mild nodularity of the liver capsule could reflect an element of underlying cirrhosis. No intrahepatic duct dilation. Portal vein is patent on color Doppler imaging with normal direction of blood flow towards the liver. Other: Right pleural effusion is incidentally noted. Trace free fluid within the right upper quadrant. Limited imaging the right kidney demonstrates at least moderate hydronephrosis. IMPRESSION: 1. Gallbladder sludge, with borderline gallbladder wall thickening and trace pericholecystic fluid. Findings could represent acute cholecystitis in the appropriate setting. If further evaluation is desired, nuclear medicine hepatobiliary scan could be performed. 2. Moderate right hydronephrosis, incompletely evaluated on this study. 3. Right pleural effusion, unchanged since previous chest x-ray. 4. Slight nodularity of the liver capsule, which may reflect findings of cirrhosis. No focal parenchymal abnormalities. Electronically Signed   By: Sharlet Salina M.D.   On: 10/23/2020 15:59    Impression: Abnormal LFTs, suspect congestive hepatopathy in the setting of acute on chronic CHF (EF 20 to 25%) and elevated BNP (1137) -T bili 1.6/AST 557/ALT 443/ALP 210, slightly decreased as compared to  yesterday T bili 1.3/AST 632/ALT 472/ALP 188  Acute hypoxic respiratory failure, underlying COPD  A. fib, on Eliquis  Plan: Recommend conservative management.  Avoid hypotension and tachycardia.  If T bili continues to rise, could consider MRI/MRCP at that time but do not suspect CBD stones as source of abnormal LFTs.  Continue to trend LFTs.  [facility] will follow.   LOS: 6 days   Edrick Kins  PA-C 10/25/2020, 12:53 PM  Contact #  [phone]

## 2020-10-25 NOTE — Progress Notes (Signed)
CRITICAL VALUE ALERT  Critical value received:  aPTT >200  Date of notification:  10/25/20  Time of notification:  17:51  Critical value read back: Yes.    Nurse who received alert:  Jani Files, RN  MD notified (1st page):  Lisabeth Devoid, MD  Time of first page:  17:52  Time MD responded:  1805   Note: heparin and aPTT levels high. Holding heparin for 1 hour. Redrawing labs.

## 2020-10-25 NOTE — CV Procedure (Signed)
    PROCEDURE NOTE:  Procedure:  Transesophageal echocardiogram Operator:  Armanda Magic, MD Indications:  atrial fibrillation Complications: None  During this procedure the patient is administered a total of Propofol 64 mg to achieve and maintain moderate conscious sedation.  The patient's heart rate, blood pressure, and oxygen saturation are monitored continuously during the procedure.   Results: Normal LV size and function Moderately dilated RV with mildly reduced RVF Massively enlarged RA  Severe LA enlargement with severe spontaneous echo contrast in both the body of the LA and the mouth of the LA appendage consistent with severely sluggish flow.  Cannot rule out thrombus in the mouth of the LA appendage.  Normal TV with annular dilatation and severe TR Normal PV with trivial PR Normal MV with trivial MR Normal trileaflet AV with trivial AR Normal interatrial septum with no evidence of shunt by colorflow dopper  Normal thoracic and ascending aorta.  The patient tolerated the procedure well.  DCCV was not performed due to concern of LA appendage thrombus.   Laurie James 10/25/2020, 1:37 PM    Signed: Armanda Magic, MD [facility]

## 2020-10-25 NOTE — Anesthesia Procedure Notes (Signed)
Procedure Name: MAC Date/Time: 10/25/2020 2:06 PM Performed by: Barrington Ellison, CRNA Pre-anesthesia Checklist: Patient identified, Emergency Drugs available, Suction available, Patient being monitored and Timeout performed Patient Re-evaluated:Patient Re-evaluated prior to induction Oxygen Delivery Method: Nasal cannula

## 2020-10-25 NOTE — Progress Notes (Addendum)
Progress Note  Patient Name: Laurie James Date of Encounter: 10/25/2020  [facility] Cardiologist: Charlton Haws, MD   Subjective   Denies any SOB or palpitation. No cardiac awareness of afib.   Inpatient Medications    Scheduled Meds:  citalopram  20 mg Oral QHS   digoxin  0.0625 mg Oral Daily   feeding supplement  237 mL Oral TID BM   metoprolol tartrate  25 mg Oral BID   multivitamin with minerals  1 tablet Oral Daily   pantoprazole  40 mg Oral Daily   Continuous Infusions:  sodium chloride 20 mL/hr at 10/25/20 0607   diltiazem (CARDIZEM) infusion 5 mg/hr (10/24/20 1258)   heparin 700 Units/hr (10/25/20 0535)   PRN Meds: lidocaine (PF), melatonin, metoprolol tartrate, ondansetron (ZOFRAN) IV, polyethylene glycol   Vital Signs    Vitals:   10/24/20 2126 10/24/20 2353 10/25/20 0453 10/25/20 0737  BP: 107/62 101/79 108/79 (!) 104/92  Pulse: 86 65 63 (!) 55  Resp:  17 16 16   Temp:  (!) 96.7 F (35.9 C) (!) 96.7 F (35.9 C) (!) 96.8 F (36 C)  TempSrc:  Axillary Axillary Axillary  SpO2:  96% 96% 98%  Weight:   46.4 kg   Height:        Intake/Output Summary (Last 24 hours) at 10/25/2020 0750 Last data filed at 10/25/2020 0100 Gross per 24 hour  Intake 756.37 ml  Output --  Net 756.37 ml   Last 3 Weights 10/25/2020 10/24/2020 10/23/2020  Weight (lbs) 102 lb 4.8 oz 101 lb 8 oz 108 lb 3.9 oz  Weight (kg) 46.403 kg 46.04 kg 49.1 kg      Telemetry    Atrial fibrillation with HR 70-80s - Personally Reviewed  ECG    Atrial fibrillation with diffuse TWI - Personally Reviewed  Physical Exam   GEN: No acute distress.   Neck: No JVD Cardiac: irregularly irregular, no murmurs, rubs, or gallops.  Respiratory: Clear to auscultation bilaterally. GI: Soft, nontender, non-distended  MS: No edema; No deformity. Neuro:  Nonfocal  Psych: Normal affect   Labs    High Sensitivity Troponin:   Recent Labs  Lab 10/05/20 1347 10/05/20 1713  10/19/20 2115 10/20/20 0043  TROPONINIHS 14 16 25* 30*      Chemistry Recent Labs  Lab 10/22/20 0242 10/23/20 0805 10/24/20 0134 10/25/20 0126  NA 129* 131* 128* 129*  K 4.4 4.8 4.3 4.9  CL 99 99 97* 98  CO2 21* 23 23 19*  GLUCOSE 111* 128* 118* 106*  BUN 41* 48* 49* 55*  CREATININE 1.16* 1.05* 0.98 0.97  CALCIUM 8.0* 8.4* 8.1* 8.4*  PROT 5.8*  --  5.8* 5.9*  ALBUMIN 2.3*  --  2.2* 2.2*  AST 358*  --  682* 557*  ALT 224*  --  472* 443*  ALKPHOS 155*  --  188* 210*  BILITOT 1.3*  --  1.3* 1.6*  GFRNONAA 49* 55* >60 >60  ANIONGAP 9 9 8 12      Hematology Recent Labs  Lab 10/23/20 0805 10/24/20 0134 10/25/20 0126  WBC 13.8* 13.1* 12.2*  RBC 4.17 4.12 4.26  HGB 13.7 13.4 14.0  HCT 40.5 39.7 41.2  MCV 97.1 96.4 96.7  MCH 32.9 32.5 32.9  MCHC 33.8 33.8 34.0  RDW 15.5 15.5 15.6*  PLT 489* 470* 406*    BNP Recent Labs  Lab 10/22/20 0242 10/23/20 0805 10/24/20 0134  BNP 1,542.5* 1,337.4* 1,340.8*     DDimer No  results for input(s): DDIMER in the last 168 hours.   Radiology    NM Hepatobiliary Liver Func  Result Date: 10/24/2020 CLINICAL DATA:  Abdominal pain nausea EXAM: NUCLEAR MEDICINE HEPATOBILIARY IMAGING TECHNIQUE: Sequential images of the abdomen were obtained out to 60 minutes following intravenous administration of radiopharmaceutical. RADIOPHARMACEUTICALS:  5.5 mCi Tc-67m  Choletec IV COMPARISON:  Ultrasound 10/23/2020 FINDINGS: Prompt uptake and biliary excretion of activity by the liver is seen. Gallbladder activity is visualized, consistent with patency of cystic duct. Delayed visualization of common bile duct activity and delayed biliary to bowel transit which is visualized by end of the second hour of imaging. IMPRESSION: 1. Negative for acute gallbladder disease. 2. Nonspecific delayed biliary to bowel transit with slight stasis of activity in the common bile duct, no ductal dilatation noted on prior ultrasound. If there is concern for ductal  obstruction, correlation with MRCP could be obtained. Electronically Signed   By: Jasmine Pang M.D.   On: 10/24/2020 19:32   US RENAL  Result Date: 10/24/2020 CLINICAL DATA:  Acute renal insufficiency EXAM: RENAL / URINARY TRACT ULTRASOUND COMPLETE COMPARISON:  Abdominal ultrasound October 23, 2020 FINDINGS: Right Kidney: Renal measurements: 9.7 x 4.0 x 4.5 cm = volume: 91 mL. Mild to moderate hydronephrosis, unchanged in the interval. Left Kidney: Renal measurements: 7.2 x 4.8 x 5.3 cm = volume: 96 mL. Echogenicity within normal limits. No mass or hydronephrosis visualized. Bladder: Appears normal for degree of bladder distention. Other: There is a left pleural effusion. IMPRESSION: 1. Moderate right hydronephrosis of uncertain chronicity and etiology is again identified, unchanged since yesterday's study. 2. Left pleural effusion. Electronically Signed   By: Gerome Sam III M.D   On: 10/24/2020 13:47   DG Chest Port 1 View  Result Date: 10/23/2020 CLINICAL DATA:  RIGHT pleural effusion. EXAM: PORTABLE CHEST 1 VIEW COMPARISON:  CT 10/20/2020 radiograph FINDINGS: Stable cardiac silhouette. There is some reaccumulation of fluid at the RIGHT lung base. Mild airspace disease in the RIGHT lower lobe. No pneumothorax. Small LEFT effusion similar. IMPRESSION: 1. Reaccumulation of small RIGHT effusion following thoracentesis. 2. New airspace disease in the RIGHT lower lobe suggesting edema or early infection. Electronically Signed   By: Genevive Bi M.D.   On: 10/23/2020 09:14   US Abdomen Limited RUQ (LIVER/GB)  Result Date: 10/23/2020 CLINICAL DATA:  Transaminitis EXAM: ULTRASOUND ABDOMEN LIMITED RIGHT UPPER QUADRANT COMPARISON:  10/23/2020, 10/20/2020. FINDINGS: Gallbladder: Gallbladder is moderately distended. Minimal sludge layers dependently within the gallbladder lumen. No evidence of shadowing gallstones. There is borderline gallbladder wall thickening measuring 3 mm. Trace pericholecystic fluid.  Negative sonographic Murphy sign. Common bile duct: Diameter: Less than 2 mm. Liver: No focal parenchymal liver abnormalities. Mild nodularity of the liver capsule could reflect an element of underlying cirrhosis. No intrahepatic duct dilation. Portal vein is patent on color Doppler imaging with normal direction of blood flow towards the liver. Other: Right pleural effusion is incidentally noted. Trace free fluid within the right upper quadrant. Limited imaging the right kidney demonstrates at least moderate hydronephrosis. IMPRESSION: 1. Gallbladder sludge, with borderline gallbladder wall thickening and trace pericholecystic fluid. Findings could represent acute cholecystitis in the appropriate setting. If further evaluation is desired, nuclear medicine hepatobiliary scan could be performed. 2. Moderate right hydronephrosis, incompletely evaluated on this study. 3. Right pleural effusion, unchanged since previous chest x-ray. 4. Slight nodularity of the liver capsule, which may reflect findings of cirrhosis. No focal parenchymal abnormalities. Electronically Signed   By: Maxwell Caul.D.  On: 10/23/2020 15:59    Cardiac Studies   Echo 10/06/2020  1. Left ventricular ejection fraction, by estimation, is 55 to 60%. The  left ventricle has normal function. The left ventricle has no regional  wall motion abnormalities. Left ventricular diastolic parameters are  consistent with Grade II diastolic  dysfunction (pseudonormalization).   2. Right ventricular systolic function is mildly reduced. The right  ventricular size is moderately enlarged. There is mildly elevated  pulmonary artery systolic pressure. The estimated right ventricular  systolic pressure is 44.2 mmHg.   3. Left atrial size was severely dilated.   4. Right atrial size was severely dilated.   5. The mitral valve is normal in structure. Mild mitral valve  regurgitation. No evidence of mitral stenosis.   6. Tricuspid valve regurgitation  is severe.   7. The aortic valve is grossly normal. There is mild calcification of the  aortic valve. Aortic valve regurgitation is mild. No aortic stenosis is  present.   8. Pulmonic valve regurgitation is moderate.   9. The inferior vena cava is dilated in size with <50% respiratory  variability, suggesting right atrial pressure of 15 mmHg.    Cath 10/07/2020 2nd Diag lesion is 60% stenosed. Prox RCA lesion is 40% stenosed. Ost RCA lesion is 30% stenosed.    Patient Profile     75 y.o. female with PMH of CAD, chronic diastolic heart failure, pulmonary hypertension, severe TR, smoker, COPD, HTN, HLD, GERD, hiatal hernia and anxiety who is admitted for SOB and failure to thrive. Felt to have volume overload in the setting of atrial fibrillation with RVR.   Assessment & Plan    Acute on chronic diastolic CHF - in the setting of afib with RVR.   - appears to be euvolemic on exam.   - lasix off. Not great candidate for daily lasix regimen given poor oral intake. In the future, may consider diuretic as needed at home  Pulmonary HTN  Atrial fibrillation with RVR: TEE DCCV scheduled for today  - Eliquis switched to IV heparin 2 days ago given cholecystitis workup, if no surgery is planned, will switch back to Eliquis   Mild AKI: resolved   Large R pleural effusion s/p R thoracentesis by interventional radiology on 10/20/2020 with removal of 1.7L of amber-colored fluid  - reaccumulation of small R effusion on CXR 7/16  Elevated transaminase: follow by surgery. Clinical picture is not consistent with acute cholecystitis. HIDA scan done, negative for gallbladder issue. No further plan for surgery, they signed off.  CAD: minimal disease on recent cath 09/2020  HTN  HLD      For questions or updates, please contact [facility] Please consult [email or web address] for contact info under        Signed, Azalee Course, PA  10/25/2020, 7:50 AM

## 2020-10-25 NOTE — Progress Notes (Signed)
Physical Therapy Treatment Patient Details Name: Laurie James MRN: [id number] DOB: [birth year] Today's Date: 10/25/2020    History of Present Illness 75 y.o. F admitted to [facility] due to worsening SOB and increased weakness. Work-up reveals pneumonia and bilateral pleural effusions, right greater than left. Her prior medical history is significant for newly diagnosed A. fib, COPD, hypertension, hyperlipidemia, chronic anxiety/depression, GERD.    PT Comments    Pt with slow progress and with general fatigue and weakness. Pt eager to return home with family. Will try rollator for longer distance next visit if pt tolerates.    Follow Up Recommendations  Home health PT;Supervision for mobility/OOB (pt declining SNf and reports assist at home)     Equipment Recommendations  Other (comment) (Possibly rollator but pt currently declining)    Recommendations for Other Services       Precautions / Restrictions Precautions Precautions: Fall Precaution Comments: tachycardia    Mobility  Bed Mobility Overal bed mobility: Needs Assistance Bed Mobility: Supine to Sit     Supine to sit: Supervision;HOB elevated     General bed mobility comments: Incr time    Transfers Overall transfer level: Needs assistance Equipment used: 4-wheeled walker Transfers: Sit to/from [employer] Sit to Stand: Min guard         General transfer comment: Assist for safety and lines  Ambulation/Gait Ambulation/Gait assistance: Min guard Gait Distance (Feet): 10 Feet Assistive device: 4-wheeled walker Gait Pattern/deviations: Shuffle;Step-through pattern;Decreased step length - right;Decreased step length - left Gait velocity: decr Gait velocity interpretation: <1.8 ft/sec, indicate of risk for recurrent falls General Gait Details: Assist for safety and to manage walker   Stairs             Wheelchair Mobility    Modified Rankin (Stroke Patients Only)        Balance Overall balance assessment: Needs assistance Sitting-balance support: No upper extremity supported;Feet supported Sitting balance-Leahy Scale: Good     Standing balance support: No upper extremity supported Standing balance-Leahy Scale: Fair                              Cognition Arousal/Alertness: Awake/alert Behavior During Therapy: WFL for tasks assessed/performed Overall Cognitive Status: Within Functional Limits for tasks assessed                                        Exercises      General Comments General comments (skin integrity, edema, etc.): VSS. HR 70-80's and SpO2 97% on RA      Pertinent Vitals/Pain Pain Assessment: No/denies pain    Home Living                      Prior Function            PT Goals (current goals can now be found in the care plan section) Acute Rehab PT Goals Patient Stated Goal: to improve strength and activity tolerance Progress towards PT goals: Progressing toward goals    Frequency    Min 3X/week      PT Plan Current plan remains appropriate    Co-evaluation              AM-PAC PT "6 Clicks" Mobility   Outcome Measure  Help needed turning from your back to your side while in a  flat bed without using bedrails?: A Little Help needed moving from lying on your back to sitting on the side of a flat bed without using bedrails?: A Little Help needed moving to and from a bed to a chair (including a wheelchair)?: A Little Help needed standing up from a chair using your arms (e.g., wheelchair or bedside chair)?: A Little Help needed to walk in hospital room?: A Little Help needed climbing 3-5 steps with a railing? : A Lot 6 Click Score: 17    End of Session Equipment Utilized During Treatment: Gait belt Activity Tolerance: Patient limited by fatigue Patient left: in chair;with call bell/phone within reach;with chair alarm set Nurse Communication: Mobility status PT Visit  Diagnosis: Unsteadiness on feet (R26.81);Muscle weakness (generalized) (M62.81)     Time: 3546-5681 PT Time Calculation (min) (ACUTE ONLY): 17 min  Charges:  $Gait Training: 8-22 mins                     [facility] PT Acute Rehabilitation Services Pager [phone] Office [phone]    Angelina Ok [facility] 10/25/2020, 11:14 AM

## 2020-10-25 NOTE — Progress Notes (Signed)
  Echocardiogram Echocardiogram Transesophageal has been performed.  Gerda Diss 10/25/2020, 2:56 PM

## 2020-10-25 NOTE — Progress Notes (Signed)
Pharmacy Antibiotic Note  Laurie James Laurie James is a 75 y.o. female admitted on 10/19/2020 with possible cholecystitis . Pharmacy has been consulted for Unasyn dosing. CrCl ~31 - will increase Unasyn slightly.  Plan: Unasyn to 3g q8h Monitor renal fx, CBC, clinical status, and narrow abx as able.   Height: 4\' 8"  (142.2 cm) Weight: 46.4 kg (102 lb 4.8 oz) IBW/kg (Calculated) : 36.3  Temp (24hrs), Avg:96.7 F (35.9 C), Min:96.1 F (35.6 C), Max:97.1 F (36.2 C)  Recent Labs  Lab 10/21/20 0129 10/22/20 0242 10/23/20 0805 10/24/20 0134 10/25/20 0126  WBC 18.9* 14.6* 13.8* 13.1* 12.2*  CREATININE 1.15* 1.16* 1.05* 0.98 0.97     Estimated Creatinine Clearance: 31.9 mL/min (by C-G formula based on SCr of 0.97 mg/dL).    Allergies  Allergen Reactions   Codeine Nausea And Vomiting    Antimicrobials this admission: Azithro 7/13 >> 7/14 CTX 7/13 >> 7/14 Unasyn 7/17 >>    Microbiology results: 7/13 Pleural Cx: ngtd   8/13, PharmD, BCPS, [facility] Clinical Pharmacist [phone] Please check AMION for all [facility] Pharmacy numbers 10/25/2020

## 2020-10-25 NOTE — Progress Notes (Addendum)
PROGRESS NOTE  Laurie James Khailee Mick LFY:[id number] DOB: September 28, 1945 DOA: 10/19/2020 PCP: Georgann Housekeeper, MD  HPI/Recap of past 24 hours: 75 y.o. female with medical history significant for recent hospitalization and discharge a week ago with newly diagnosed A. fib, plus history of COPD, hypertension presented to the ED on 7/12 with weakness, shortness of breath, cough and found to have pneumonia ane bilateral pleural effusions.  Started on IV antibiotics and admitted to the hospitalist service. Associated with generalized weakness, semi productive cough, chills, poor oral intake.  Following admission, interventional radiology removed 1.7 L of fluid from right lung by thoracentesis.  Patient's breathing improved.  Blood pressure became quite soft on 7/14.  Patient complaining of feeling very weak and nauseated.  Also developed some rapid atrial fibrillation.  He subsequently received IV fluids.  Cardiology following.  Monitor.   Subjective.  Patient in bed, appears comfortable, denies any headache, no fever, no chest pain or pressure, no shortness of breath , no abdominal pain. No focal weakness.  Assessment/Plan:   Acute hypoxic respiratory failure causing shortness of breath due to bilateral pleural effusions/Acute on chronic diastolic CHF EF on 10/06/2020 shows a EF of 60%.  Status postthoracentesis with 1.7 L transudative fluid removed on 10/20/2020, no malignant cells yet, unfortunately fluid has reaccumulated however she has no symptoms, currently on room air and not short of breath.  Have her follow-up with pulmonary outpatient as she is a lifetime smoker and is at risk for malignancy.  Paroxysmal A. fib [place] vas 2 score of greater than 3.  Currently on Cardizem drip along with digoxin, continue anticoagulation switch to heparin drip on 10/24/2020 due to possibility of cholecystitis, cardiology on board, undergoing TEE with cardioversion on Monday.  Stable TSH.  CAD  Stable during this  hospitalization.  Transaminitis with possible cholecystitis.  Reason unclear, intermittent right upper quadrant discomfort according to the patient in the past, right upper quadrant ultrasound suggestive of cholecystitis but HIDA scan negative, since LFTs have gone up significantly we will request GI to evaluate, question if this could be due to hepatic congestion.  HCAP (healthcare-associated pneumonia): Sepsis was ruled out likely cause of leukocytosis was possible cholecystitis above.  Ongoing history of smoking along with underlying history of COPD - she still to quit smoking, supportive care for COPD no acute issues.  Nausea: Improved with supportive care.  No nausea vomiting.  Pulmonary HTN (HCC) - supportive care outpatient follow-up with pulmonary.  HLD (hyperlipidemia) - home dose statin on hold due to transaminitis.    Depression: Stable, cont home meds  Hyponatremia: likely dilutional in the context of acute heart failure - will obtain serum osmolality along with urine sodium and osmolality along with uric acid  Right-sided incidental mild hydronephrosis.  With stable renal function.  Outpatient urology follow-up post discharge.    Severe protein calorie malnutrition: Seen by nutrition. Nutrition Status: Nutrition Problem: Severe Malnutrition Etiology: chronic illness (COPD, CAD) Signs/Symptoms: severe muscle depletion, severe fat depletion Interventions: Ensure Enlive (each supplement provides 350kcal and 20 grams of protein), Hormel Shake, MVI     Code Status: Full Code   Family Communication: son at bedside 10/23/20, family bedside 10/24/2020, 10/25/2020  Disposition Plan: anticipate discharge hopefully this weekend once atrial fibrillation controlled   Consultants: Int Radiology  [facility] GI Cards  Procedures:  Thoracentesis done 7/13 - 1.7 lit transudative fluid, no malignant cells, LDH >117  Right upper quadrant ultrasound 10/23/20 -  with possible  cholecystitis  HIDA scan with  EF 10/24/2020 - 1. Negative for acute gallbladder disease. 2. Nonspecific delayed biliary to bowel transit with slight stasis of activity in the common bile duct, no ductal dilatation noted on prior ultrasound. If there is concern for ductal obstruction, correlation with MRCP could be obtained.  Renal ultrasound 10/24/20.  1. Moderate right hydronephrosis of uncertain chronicity and etiology is again identified, unchanged since yesterday's study. 2. Left pleural effusion.   TEE scheduled for 10/25/2020 -     Antimicrobials: IV Rocephin and Zithromax 7/13-7/14 Unasyn 7/17  DVT prophylaxis:  Eliquis >> Hep gtt  Level of care: Telemetry Cardiac   Objective: Vitals:   10/25/20 0737 10/25/20 0908  BP: (!) 104/92 104/65  Pulse: (!) 55 87  Resp: 16   Temp: (!) 96.8 F (36 C)   SpO2: 98%     Intake/Output Summary (Last 24 hours) at 10/25/2020 1023 Last data filed at 10/25/2020 0100 Gross per 24 hour  Intake 756.37 ml  Output --  Net 756.37 ml   Filed Weights   10/23/20 0451 10/24/20 0411 10/25/20 0453  Weight: 49.1 kg 46 kg 46.4 kg   Body mass index is 22.94 kg/m.  Exam:  Awake Alert, No new F.N deficits, Normal affect .AT,PERRAL Supple Neck,No JVD, No cervical lymphadenopathy appriciated.  Symmetrical Chest wall movement, Good air movement bilaterally, CTAB iRRR,No Gallops, Rubs or new Murmurs, No Parasternal Heave +ve B.Sounds, Abd Soft, No tenderness, No organomegaly appriciated, No rebound - guarding or rigidity. No Cyanosis, Clubbing or edema, No new Rash or bruise   Data Reviewed:  Recent Labs  Lab 10/19/20 2115 10/20/20 0331 10/21/20 0129 10/22/20 0242 10/23/20 0805 10/24/20 0134 10/25/20 0126  WBC 14.8*   < > 18.9* 14.6* 13.8* 13.1* 12.2*  HGB 13.9   < > 13.8 13.3 13.7 13.4 14.0  HCT 41.5   < > 39.3 39.3 40.5 39.7 41.2  PLT 558*   < > 615* 535* 489* 470* 406*  MCV 97.9   < > 95.4 96.1 97.1 96.4 96.7  MCH 32.8   < >  33.5 32.5 32.9 32.5 32.9  MCHC 33.5   < > 35.1 33.8 33.8 33.8 34.0  RDW 15.0   < > 15.1 15.3 15.5 15.5 15.6*  LYMPHSABS 0.6*  --   --   --   --  0.7 1.0  MONOABS 1.0  --   --   --   --  0.9 1.0  EOSABS 0.0  --   --   --   --  0.1 0.1  BASOSABS 0.0  --   --   --   --  0.0 0.0   < > = values in this interval not displayed.    Recent Labs  Lab 10/19/20 2115 10/20/20 0331 10/21/20 0129 10/22/20 0242 10/23/20 0805 10/24/20 0134 10/24/20 1124 10/24/20 1611 10/25/20 0126 10/25/20 0641  NA 130* 129* 128* 129* 131* 128*  --   --  129*  --   K 4.5 4.5 4.3 4.4 4.8 4.3  --   --  4.9  --   CL 95* 97* 94* 99 99 97*  --   --  98  --   CO2 21* 20* 23 21* 23 23  --   --  19*  --   GLUCOSE 145* 147* 133* 111* 128* 118*  --   --  106*  --   BUN 25* 24* 32* 41* 48* 49*  --   --  55*  --   CREATININE 0.96  0.86 1.15* 1.16* 1.05* 0.98  --   --  0.97  --   CALCIUM 8.6* 8.6* 8.5* 8.0* 8.4* 8.1*  --   --  8.4*  --   AST 165* 154* 197* 358*  --  682*  --   --  557*  --   ALT 103* 101* 133* 224*  --  472*  --   --  443*  --   ALKPHOS 161* 144* 151* 155*  --  188*  --   --  210*  --   BILITOT 1.8* 1.6* 1.5* 1.3*  --  1.3*  --   --  1.6*  --   ALBUMIN 2.8* 2.7* 2.4* 2.3*  --  2.2*  --   --  2.2*  --   MG  --  2.0  --   --  2.2 2.2  --   --  2.4  --   PROCALCITON  --  <0.10  --   --  0.19 0.19  --   --  0.24  --   INR  --   --   --   --   --   --   --  4.8*  --   --   TSH  --   --   --   --   --   --  3.795  --   --   --   BNP 895.8*  --   --  1,542.5* 1,337.4* 1,340.8*  --   --   --  1,137.5*       Studies: NM Hepatobiliary Liver Func  Result Date: 10/24/2020 CLINICAL DATA:  Abdominal pain nausea EXAM: NUCLEAR MEDICINE HEPATOBILIARY IMAGING TECHNIQUE: Sequential images of the abdomen were obtained out to 60 minutes following intravenous administration of radiopharmaceutical. RADIOPHARMACEUTICALS:  5.5 mCi Tc-94m  Choletec IV COMPARISON:  Ultrasound 10/23/2020 FINDINGS: Prompt uptake and biliary  excretion of activity by the liver is seen. Gallbladder activity is visualized, consistent with patency of cystic duct. Delayed visualization of common bile duct activity and delayed biliary to bowel transit which is visualized by end of the second hour of imaging. IMPRESSION: 1. Negative for acute gallbladder disease. 2. Nonspecific delayed biliary to bowel transit with slight stasis of activity in the common bile duct, no ductal dilatation noted on prior ultrasound. If there is concern for ductal obstruction, correlation with MRCP could be obtained. Electronically Signed   By: Jasmine Pang M.D.   On: 10/24/2020 19:32   US RENAL  Result Date: 10/24/2020 CLINICAL DATA:  Acute renal insufficiency EXAM: RENAL / URINARY TRACT ULTRASOUND COMPLETE COMPARISON:  Abdominal ultrasound October 23, 2020 FINDINGS: Right Kidney: Renal measurements: 9.7 x 4.0 x 4.5 cm = volume: 91 mL. Mild to moderate hydronephrosis, unchanged in the interval. Left Kidney: Renal measurements: 7.2 x 4.8 x 5.3 cm = volume: 96 mL. Echogenicity within normal limits. No mass or hydronephrosis visualized. Bladder: Appears normal for degree of bladder distention. Other: There is a left pleural effusion. IMPRESSION: 1. Moderate right hydronephrosis of uncertain chronicity and etiology is again identified, unchanged since yesterday's study. 2. Left pleural effusion. Electronically Signed   By: Gerome Sam III M.D   On: 10/24/2020 13:47    Scheduled Meds:  citalopram  20 mg Oral QHS   digoxin  0.0625 mg Oral Daily   feeding supplement  237 mL Oral TID BM   metoprolol tartrate  25 mg Oral BID   multivitamin with minerals  1 tablet Oral Daily  pantoprazole  40 mg Oral Daily    Continuous Infusions:  sodium chloride 20 mL/hr at 10/25/20 0607   ampicillin-sulbactam (UNASYN) IV 3 g (10/25/20 0959)   diltiazem (CARDIZEM) infusion 5 mg/hr (10/24/20 1258)   heparin 700 Units/hr (10/25/20 0535)     LOS: 6 days   Signature  Susa Raring M.D on 10/25/2020 at 10:23 AM   -  To page go to [email or web address]

## 2020-10-25 NOTE — Care Management Important Message (Signed)
Important Message  Patient Details  Name: Laurie James MRN: [id number] Date of Birth: June 03, 1945   Medicare Important Message Given:  Yes     Renie Ora 10/25/2020, 10:37 AM

## 2020-10-25 NOTE — Transfer of Care (Signed)
Immediate Anesthesia Transfer of Care Note  Patient: Necola Bluestein [facility]  Procedure(s) Performed: TRANSESOPHAGEAL ECHOCARDIOGRAM (TEE)  Patient Location: Endoscopy Unit  Anesthesia Type:MAC  Level of Consciousness: drowsy  Airway & Oxygen Therapy: Patient Spontanous Breathing and Patient connected to nasal cannula oxygen  Post-op Assessment: Report given to RN  Post vital signs: Reviewed and stable  Last Vitals:  Vitals Value Taken Time  BP 111/81   Temp    Pulse 107 10/25/20 1432  Resp 15 10/25/20 1432  SpO2 99 % 10/25/20 1432  Vitals shown include unvalidated device data.  Last Pain:  Vitals:   10/25/20 1305  TempSrc: Temporal  PainSc: 0-No pain      Patients Stated Pain Goal: 0 (15/05/69 7948)  Complications: No notable events documented.

## 2020-10-25 NOTE — Anesthesia Postprocedure Evaluation (Addendum)
Anesthesia Post Note  Patient: Laurie James [facility]  Procedure(s) Performed: TRANSESOPHAGEAL ECHOCARDIOGRAM (TEE)     Patient location during evaluation: Endoscopy Anesthesia Type: MAC Level of consciousness: awake and alert Pain management: pain level controlled Vital Signs Assessment: post-procedure vital signs reviewed and stable Respiratory status: spontaneous breathing, nonlabored ventilation, respiratory function stable and patient connected to nasal cannula oxygen Cardiovascular status: blood pressure returned to baseline and stable Postop Assessment: no apparent nausea or vomiting Anesthetic complications: no   No notable events documented.  Last Vitals:  Vitals:   10/25/20 1451 10/25/20 1513  BP:  106/89  Pulse: 78 64  Resp:  17  Temp:  (!) 35.4 C  SpO2: 97% 96%    Last Pain:  Vitals:   10/25/20 1513  TempSrc: Axillary  PainSc: 0-No pain                 Laurie James

## 2020-10-25 NOTE — Interval H&P Note (Signed)
History and Physical Interval Note:  10/25/2020 1:36 PM  Laurie James [name]  has presented today for surgery, with the diagnosis of AFIB.  The various methods of treatment have been discussed with the patient and family. After consideration of risks, benefits and other options for treatment, the patient has consented to  Procedure(s): TRANSESOPHAGEAL ECHOCARDIOGRAM (TEE) (N/A) CARDIOVERSION (N/A) as a surgical intervention.  The patient's history has been reviewed, patient examined, no change in status, stable for surgery.  I have reviewed the patient's chart and labs.  Questions were answered to the patient's satisfaction.     Armanda Magic

## 2020-10-25 NOTE — Progress Notes (Signed)
SLP Cancellation Note  Patient Details Name: Laurie James MRN: [id number] DOB: June 03, 1945   Cancelled treatment:       Reason Eval/Treat Not Completed: Other (comment) Pt NPO for TEE planned for today. Will f/u as able.     Mahala Menghini., M.A. CCC-SLP Acute Rehabilitation Services Pager [phone] Office [phone]  10/25/2020, 8:15 AM

## 2020-10-25 NOTE — Discharge Instructions (Signed)
Please request your primary care physician to refer you to a urologist, pulmonologist outpatient for recurrent pleural effusion and right-sided hydronephrosis.

## 2020-10-25 NOTE — Progress Notes (Signed)
ANTICOAGULATION CONSULT NOTE - Follow Up Consult  Pharmacy Consult for IV Heparin Indication: atrial fibrillation  Allergies  Allergen Reactions   Codeine Nausea And Vomiting    Patient Measurements: Height: 4\' 8"  (142.2 cm) Weight: 46.4 kg (102 lb 4.8 oz) IBW/kg (Calculated) : 36.3 Heparin Dosing Weight: 46 kg  Vital Signs: Temp: 95.8 F (35.4 C) (07/18 1513) Temp Source: Axillary (07/18 1513) BP: 106/89 (07/18 1513) Pulse Rate: 64 (07/18 1513)  Labs: Recent Labs    10/23/20 0805 10/24/20 0134 10/24/20 1611 10/25/20 0126 10/25/20 1514  HGB 13.7 13.4  --  14.0  --   HCT 40.5 39.7  --  41.2  --   PLT 489* 470*  --  406*  --   APTT  --   --  52* 174* >200*  LABPROT  --   --  45.0*  --   --   INR  --   --  4.8*  --   --   HEPARINUNFRC  --   --  >1.10* >1.10* >1.10*  CREATININE 1.05* 0.98  --  0.97  --    Estimated Creatinine Clearance: 31.9 mL/min (by C-G formula based on SCr of 0.97 mg/dL).   Medical History: Past Medical History:  Diagnosis Date   AMD (age related macular degeneration)    COPD (chronic obstructive pulmonary disease) (HCC)    Depression    Hiatal hernia    Intermittent low back pain    Memory impairment    Midsternal chest pain    a. 12/2011 Cardiac CTA Ca++ score of 103.3 (80th %), LAD <50p/m, RCA 50-75.   Osteoporosis    Vitamin B 12 deficiency     Assessment: 75 yr old female on apixaban PTA for atrial fibrillation (last dose 7/16 2100); apixaban on hold, suspected cholecystitis. DCCV planned for 7/18. Pharmacy was consulted to start IV heparin. Given recent apixaban exposure, will monitor anticoagulation using aPTT until aPTT and heparin levels correlate.  aPTT and heparin level this afternoon on heparin infusion at 700 units/hr were elevated at >200 sec and >1.10 units/ml, respectively. Per RN, labs were drawn from near where heparin IV was infusing (same arm); she was advised to hold heparin infusion X 1 hr and obtain stat aPTT, heparin  levels drawn correctly (from opposite arm from where heparin is infusing). Lab drew repeat aPTT/heparin level shortly thereafter, which were reported as 118 sec and >1.10 units/ml, respectively (heparin level was high, due to influence of apixaban; aPTT is above the goal range for this pt). Per RN, no issues with IV or bleeding observed.  H/H 14.0/41.2, plt 406 (CBC stable)  Goal of Therapy:  Heparin level 0.3-0.7 units/ml aPTT 66-102 seconds Monitor platelets by anticoagulation protocol: Yes   Plan:  Reduce heparin infusion to 600 units/hr Check aPTT/heparin level in 8 hrs Monitor daily aPTT, heparin level, CBC Monitor for bleeding  08-23-1995, PharmD, BCPS, [facility] Clinical Pharmacist

## 2020-10-25 NOTE — Anesthesia Preprocedure Evaluation (Addendum)
Anesthesia Evaluation  Patient identified by MRN, date of birth, ID band Patient awake    Reviewed: Allergy & Precautions, H&P , NPO status , Patient's Chart, lab work & pertinent test results, reviewed documented beta blocker date and time   Airway Mallampati: III  TM Distance: >3 FB Neck ROM: Full    Dental no notable dental hx. (+) Edentulous Upper, Edentulous Lower, Dental Advisory Given   Pulmonary COPD,  COPD inhaler, Current Smoker,    Pulmonary exam normal breath sounds clear to auscultation       Cardiovascular + CAD  negative cardio ROS  + dysrhythmias Atrial Fibrillation  Rhythm:Irregular Rate:Bradycardia     Neuro/Psych Depression negative neurological ROS     GI/Hepatic Neg liver ROS, hiatal hernia,   Endo/Other  negative endocrine ROS  Renal/GU negative Renal ROS  negative genitourinary   Musculoskeletal   Abdominal   Peds  Hematology negative hematology ROS (+)   Anesthesia Other Findings   Reproductive/Obstetrics negative OB ROS                            Anesthesia Physical Anesthesia Plan  ASA: 3  Anesthesia Plan: General   Post-op Pain Management:    Induction: Intravenous  PONV Risk Score and Plan: 2 and Propofol infusion and Treatment may vary due to age or medical condition  Airway Management Planned: Nasal Cannula  Additional Equipment:   Intra-op Plan:   Post-operative Plan:   Informed Consent: I have reviewed the patients History and Physical, chart, labs and discussed the procedure including the risks, benefits and alternatives for the proposed anesthesia with the patient or authorized representative who has indicated his/her understanding and acceptance.     Dental advisory given  Plan Discussed with: CRNA  Anesthesia Plan Comments:         Anesthesia Quick Evaluation

## 2020-10-26 ENCOUNTER — Encounter (HOSPITAL_COMMUNITY): Payer: Self-pay | Admitting: Cardiology

## 2020-10-26 ENCOUNTER — Ambulatory Visit (HOSPITAL_COMMUNITY): Payer: Medicare Other | Admitting: Physician Assistant

## 2020-10-26 ENCOUNTER — Inpatient Hospital Stay: Payer: Self-pay

## 2020-10-26 ENCOUNTER — Inpatient Hospital Stay (HOSPITAL_COMMUNITY): Payer: Medicare Other

## 2020-10-26 DIAGNOSIS — I272 Pulmonary hypertension, unspecified: Secondary | ICD-10-CM | POA: Diagnosis not present

## 2020-10-26 DIAGNOSIS — R570 Cardiogenic shock: Secondary | ICD-10-CM

## 2020-10-26 DIAGNOSIS — I5043 Acute on chronic combined systolic (congestive) and diastolic (congestive) heart failure: Secondary | ICD-10-CM

## 2020-10-26 DIAGNOSIS — J90 Pleural effusion, not elsewhere classified: Secondary | ICD-10-CM | POA: Diagnosis not present

## 2020-10-26 DIAGNOSIS — I4821 Permanent atrial fibrillation: Secondary | ICD-10-CM | POA: Diagnosis not present

## 2020-10-26 DIAGNOSIS — I5033 Acute on chronic diastolic (congestive) heart failure: Secondary | ICD-10-CM | POA: Diagnosis not present

## 2020-10-26 DIAGNOSIS — I2510 Atherosclerotic heart disease of native coronary artery without angina pectoris: Secondary | ICD-10-CM | POA: Diagnosis not present

## 2020-10-26 DIAGNOSIS — I4819 Other persistent atrial fibrillation: Secondary | ICD-10-CM | POA: Diagnosis not present

## 2020-10-26 LAB — COMPREHENSIVE METABOLIC PANEL
ALT: 439 U/L — ABNORMAL HIGH (ref 0–44)
AST: 490 U/L — ABNORMAL HIGH (ref 15–41)
Albumin: 2.2 g/dL — ABNORMAL LOW (ref 3.5–5.0)
Alkaline Phosphatase: 209 U/L — ABNORMAL HIGH (ref 38–126)
Anion gap: 13 (ref 5–15)
BUN: 58 mg/dL — ABNORMAL HIGH (ref 8–23)
CO2: 21 mmol/L — ABNORMAL LOW (ref 22–32)
Calcium: 8.4 mg/dL — ABNORMAL LOW (ref 8.9–10.3)
Chloride: 100 mmol/L (ref 98–111)
Creatinine, Ser: 1.07 mg/dL — ABNORMAL HIGH (ref 0.44–1.00)
GFR, Estimated: 54 mL/min — ABNORMAL LOW (ref >60)
Glucose, Bld: 116 mg/dL — ABNORMAL HIGH (ref 70–99)
Potassium: 4.5 mmol/L (ref 3.5–5.1)
Sodium: 134 mmol/L — ABNORMAL LOW (ref 135–145)
Total Bilirubin: 1.7 mg/dL — ABNORMAL HIGH (ref 0.3–1.2)
Total Protein: 5.8 g/dL — ABNORMAL LOW (ref 6.5–8.1)

## 2020-10-26 LAB — CBC WITH DIFFERENTIAL/PLATELET
Abs Immature Granulocytes: 0.09 10*3/uL — ABNORMAL HIGH (ref 0.00–0.07)
Basophils Absolute: 0 10*3/uL (ref 0.0–0.1)
Basophils Relative: 0 %
Eosinophils Absolute: 0.1 10*3/uL (ref 0.0–0.5)
Eosinophils Relative: 1 %
HCT: 40.4 % (ref 36.0–46.0)
Hemoglobin: 13.8 g/dL (ref 12.0–15.0)
Immature Granulocytes: 1 %
Lymphocytes Relative: 6 %
Lymphs Abs: 0.8 10*3/uL (ref 0.7–4.0)
MCH: 32.9 pg (ref 26.0–34.0)
MCHC: 34.2 g/dL (ref 30.0–36.0)
MCV: 96.2 fL (ref 80.0–100.0)
Monocytes Absolute: 1 10*3/uL (ref 0.1–1.0)
Monocytes Relative: 8 %
Neutro Abs: 10.8 10*3/uL — ABNORMAL HIGH (ref 1.7–7.7)
Neutrophils Relative %: 84 %
Platelets: 398 10*3/uL (ref 150–400)
RBC: 4.2 MIL/uL (ref 3.87–5.11)
RDW: 16 % — ABNORMAL HIGH (ref 11.5–15.5)
WBC: 12.8 10*3/uL — ABNORMAL HIGH (ref 4.0–10.5)
nRBC: 0.2 % (ref 0.0–0.2)

## 2020-10-26 LAB — APTT: aPTT: 73 seconds — ABNORMAL HIGH (ref 24–36)

## 2020-10-26 LAB — COOXEMETRY PANEL
Carboxyhemoglobin: 0.7 % (ref 0.5–1.5)
Carboxyhemoglobin: 1.1 % (ref 0.5–1.5)
Methemoglobin: 0.8 % (ref 0.0–1.5)
Methemoglobin: 1.1 % (ref 0.0–1.5)
O2 Saturation: 38.5 %
O2 Saturation: 50.4 %
Total hemoglobin: 13.9 g/dL (ref 12.0–16.0)
Total hemoglobin: 13.1 g/dL (ref 12.0–16.0)

## 2020-10-26 LAB — BRAIN NATRIURETIC PEPTIDE: B Natriuretic Peptide: 990.2 pg/mL — ABNORMAL HIGH (ref 0.0–100.0)

## 2020-10-26 LAB — DIGOXIN LEVEL: Digoxin Level: 1.4 ng/mL (ref 0.8–2.0)

## 2020-10-26 LAB — SODIUM, URINE, RANDOM: Sodium, Ur: <10 mmol/L

## 2020-10-26 LAB — MRSA NEXT GEN BY PCR, NASAL: MRSA by PCR Next Gen: NOT DETECTED

## 2020-10-26 LAB — HEPARIN LEVEL (UNFRACTIONATED): Heparin Unfractionated: >1.1 IU/mL — ABNORMAL HIGH (ref 0.30–0.70)

## 2020-10-26 LAB — LACTIC ACID, PLASMA
Lactic Acid, Venous: 2.3 mmol/L (ref 0.5–1.9)
Lactic Acid, Venous: 1.7 mmol/L (ref 0.5–1.9)

## 2020-10-26 LAB — MAGNESIUM: Magnesium: 2.6 mg/dL — ABNORMAL HIGH (ref 1.7–2.4)

## 2020-10-26 MED ORDER — CEFTRIAXONE SODIUM 2 G IJ SOLR
2.0000 g | INTRAMUSCULAR | Status: AC
  Administered 2020-10-26 – 2020-10-29 (×4): 2 g via INTRAVENOUS
  Filled 2020-10-26 (×4): qty 20

## 2020-10-26 MED ORDER — FUROSEMIDE 10 MG/ML IJ SOLN
40.0000 mg | Freq: Two times a day (BID) | INTRAMUSCULAR | Status: DC
  Administered 2020-10-26: 40 mg via INTRAVENOUS
  Filled 2020-10-26: qty 4

## 2020-10-26 MED ORDER — AMIODARONE HCL IN DEXTROSE 360-4.14 MG/200ML-% IV SOLN
60.0000 mg/h | INTRAVENOUS | Status: AC
  Administered 2020-10-26 (×2): 60 mg/h via INTRAVENOUS
  Filled 2020-10-26 (×2): qty 200

## 2020-10-26 MED ORDER — CHLORHEXIDINE GLUCONATE CLOTH 2 % EX PADS
6.0000 | MEDICATED_PAD | Freq: Every day | CUTANEOUS | Status: DC
  Administered 2020-10-26 – 2020-11-04 (×10): 6 via TOPICAL

## 2020-10-26 MED ORDER — FUROSEMIDE 10 MG/ML IJ SOLN
40.0000 mg | Freq: Once | INTRAMUSCULAR | Status: AC
  Administered 2020-10-26: 40 mg via INTRAVENOUS
  Filled 2020-10-26: qty 4

## 2020-10-26 MED ORDER — AMIODARONE HCL IN DEXTROSE 360-4.14 MG/200ML-% IV SOLN
30.0000 mg/h | INTRAVENOUS | Status: DC
Start: 2020-10-26 — End: 2020-11-03
  Administered 2020-10-26 – 2020-10-28 (×4): 30 mg/h via INTRAVENOUS
  Administered 2020-10-29: 60 mg/h via INTRAVENOUS
  Administered 2020-10-29: 30 mg/h via INTRAVENOUS
  Administered 2020-10-29: 60 mg/h via INTRAVENOUS
  Administered 2020-10-30: 30 mg/h via INTRAVENOUS
  Administered 2020-10-30: 60 mg/h via INTRAVENOUS
  Administered 2020-10-31 – 2020-11-01 (×4): 30 mg/h via INTRAVENOUS
  Filled 2020-10-26 (×16): qty 200

## 2020-10-26 MED ORDER — SODIUM CHLORIDE 0.9% FLUSH
10.0000 mL | Freq: Two times a day (BID) | INTRAVENOUS | Status: DC
Start: 2020-10-26 — End: 2020-11-04
  Administered 2020-10-26 – 2020-11-04 (×17): 10 mL

## 2020-10-26 MED ORDER — NOREPINEPHRINE 4 MG/250ML-% IV SOLN
0.0000 ug/min | INTRAVENOUS | Status: DC
  Administered 2020-10-26: 2 ug/min via INTRAVENOUS
  Filled 2020-10-26: qty 250

## 2020-10-26 MED ORDER — NYSTATIN 100000 UNIT/ML MT SUSP
5.0000 mL | Freq: Four times a day (QID) | OROMUCOSAL | Status: DC
  Administered 2020-10-26 – 2020-11-04 (×33): 500000 [IU] via ORAL
  Filled 2020-10-26 (×32): qty 5

## 2020-10-26 MED ORDER — MILRINONE LACTATE IN DEXTROSE 20-5 MG/100ML-% IV SOLN
0.1250 ug/kg/min | INTRAVENOUS | Status: DC
Start: 2020-10-26 — End: 2020-11-02
  Administered 2020-10-26 – 2020-10-27 (×2): 0.25 ug/kg/min via INTRAVENOUS
  Administered 2020-10-29 – 2020-10-31 (×3): 0.375 ug/kg/min via INTRAVENOUS
  Administered 2020-11-01: 0.25 ug/kg/min via INTRAVENOUS
  Filled 2020-10-26 (×8): qty 100

## 2020-10-26 MED ORDER — SODIUM CHLORIDE 0.9% FLUSH: 10.0000 mL | INTRAVENOUS | Status: DC | PRN

## 2020-10-26 NOTE — Progress Notes (Signed)
[facility] Gastroenterology Progress Note  Kenisha Lynds [facility] 75 y.o. Apr 21, 1945  CC:   Abn LFts   Subjective: Patient seen and examined at bedside.  Family and RN at bedside.  No acute GI issues.      Objective: Vital signs in last 24 hours: Vitals:   10/26/20 1111 10/26/20 1130  BP: 95/73   Pulse:  90  Resp: (!) 29   Temp:  (!) 96.7 F (35.9 C)  SpO2:      Physical Exam:  General : Elderly appearing patient, not in acute distress Abdomen : Soft, nontender, nondistended, bowel sounds present.  No peritoneal signs   Lab Results: Recent Labs    10/25/20 0126 10/26/20 0639  NA 129* 134*  K 4.9 4.5  CL 98 100  CO2 19* 21*  GLUCOSE 106* 116*  BUN 55* 58*  CREATININE 0.97 1.07*  CALCIUM 8.4* 8.4*  MG 2.4 2.6*   Recent Labs    10/25/20 0126 10/26/20 0639  AST 557* 490*  ALT 443* 439*  ALKPHOS 210* 209*  BILITOT 1.6* 1.7*  PROT 5.9* 5.8*  ALBUMIN 2.2* 2.2*   Recent Labs    10/25/20 0126 10/26/20 0639  WBC 12.2* 12.8*  NEUTROABS 10.1* 10.8*  HGB 14.0 13.8  HCT 41.2 40.4  MCV 96.7 96.2  PLT 406* 398   Recent Labs    10/24/20 1611  LABPROT 45.0*  INR 4.8*      Assessment/Plan: -Abnormal LFTs.  Most likely congestive hepatopathy.  LFTs stable now. -Hypoxic respiratory failure. -CHF with EF of 25% -Paroxysmal atrial fibrillation with possible left atrial appendage thrombus.  On heparin drip  Recommendations ------------------------------ -Continue supportive care for now -Monitor LFTs and INR -GI will follow   Kathi Der MD, FACP 10/26/2020, 2:51 PM  Contact #  [phone]

## 2020-10-26 NOTE — Progress Notes (Signed)
ANTICOAGULATION CONSULT NOTE - Follow Up Consult  Pharmacy Consult for IV Heparin Indication: atrial fibrillation and suspected LAA thrombus 7/18  Allergies  Allergen Reactions   Codeine Nausea And Vomiting    Patient Measurements: Height: 4\' 8"  (142.2 cm) Weight: 46.4 kg (102 lb 4.8 oz) IBW/kg (Calculated) : 36.3 Heparin Dosing Weight: 46 kg  Vital Signs: Temp: 94.2 F (34.6 C) (07/19 0742) Temp Source: Axillary (07/19 0742) BP: 97/68 (07/19 0742) Pulse Rate: 70 (07/19 0742)  Labs: Recent Labs    10/24/20 0134 10/24/20 1611 10/24/20 1611 10/25/20 0126 10/25/20 1514 10/25/20 1813 10/26/20 0639  HGB 13.4  --   --  14.0  --   --  13.8  HCT 39.7  --   --  41.2  --   --  40.4  PLT 470*  --   --  406*  --   --  398  APTT  --  52*   < > 174* >200* 118* 73*  LABPROT  --  45.0*  --   --   --   --   --   INR  --  4.8*  --   --   --   --   --   HEPARINUNFRC  --  >1.10*   < > >1.10* >1.10* >1.10* >1.10*  CREATININE 0.98  --   --  0.97  --   --  1.07*   < > = values in this interval not displayed.  Estimated Creatinine Clearance: 28.9 mL/min (A) (by C-G formula based on SCr of 1.07 mg/dL (H)).   Medical History: Past Medical History:  Diagnosis Date   AMD (age related macular degeneration)    COPD (chronic obstructive pulmonary disease) (HCC)    Depression    Hiatal hernia    Intermittent low back pain    Memory impairment    Midsternal chest pain    a. 12/2011 Cardiac CTA Ca++ score of 103.3 (80th %), LAD <50p/m, RCA 50-75.   Osteoporosis    Vitamin B 12 deficiency     Assessment: 75 yr old female on apixaban PTA for atrial fibrillation (last dose 7/16 2100); apixaban on hold for elevated LFTs. DCCV not done due to possible LA appendage thrombus 7/18. Pharmacy was consulted to start IV heparin. Given recent apixaban exposure, will monitor anticoagulation using aPTT until aPTT and heparin levels correlate.  APTT 73 is therapeutic, HL still > 1.1 is not correlating.  H/H, plt stable  Goal of Therapy:  Heparin level 0.3-0.7 units/ml aPTT 66-102 seconds Monitor platelets by anticoagulation protocol: Yes   Plan:  Continue heparin infusion 600 units/hr F/u aPTT until correlates with heparin level  Monitor daily aPTT, heparin level, CBC Monitor for bleeding F/u transition to oral anticoagulant   8/18, PharmD, BCPS, BCCP Clinical Pharmacist  Please check AMION for all [facility] Pharmacy phone numbers After 10:00 PM, call Main Pharmacy [phone]

## 2020-10-26 NOTE — Progress Notes (Signed)
Progress Note  Patient Name: Laurie JarredBarbara Ann Cristy FriedlanderWoodlief James Date of Encounter: 10/26/2020  [facility] Cardiologist: Charlton HawsPeter Nishan, MD   Subjective   Sitting up in chair.   Inpatient Medications    Scheduled Meds:  citalopram  20 mg Oral QHS   digoxin  0.0625 mg Oral Daily   feeding supplement  237 mL Oral TID BM   metoprolol tartrate  25 mg Oral BID   multivitamin with minerals  1 tablet Oral Daily   pantoprazole  40 mg Oral Daily   Continuous Infusions:  ampicillin-sulbactam (UNASYN) IV 3 g (10/26/20 0851)   diltiazem (CARDIZEM) infusion 5 mg/hr (10/25/20 1510)   heparin 600 Units/hr (10/25/20 2056)   PRN Meds: lidocaine (PF), melatonin, metoprolol tartrate, ondansetron (ZOFRAN) IV, polyethylene glycol   Vital Signs    Vitals:   10/25/20 1513 10/25/20 2054 10/26/20 0742 10/26/20 0851  BP: 106/89 105/65 97/68 115/78  Pulse: 64 83 70 91  Resp: 17  18   Temp: (!) 95.8 F (35.4 C)  (!) 94.2 F (34.6 C)   TempSrc: Axillary  Axillary   SpO2: 96%  96%   Weight:      Height:        Intake/Output Summary (Last 24 hours) at 10/26/2020 0905 Last data filed at 10/25/2020 1855 Gross per 24 hour  Intake 565.55 ml  Output --  Net 565.55 ml   Last 3 Weights 10/25/2020 10/24/2020 10/23/2020  Weight (lbs) 102 lb 4.8 oz 101 lb 8 oz 108 lb 3.9 oz  Weight (kg) 46.403 kg 46.04 kg 49.1 kg      Telemetry    Afib rates 70-80s - Personally Reviewed  ECG    No new tracing this morning.  Physical Exam   GEN: Frail older female  Neck: No JVD Cardiac: Irreg Irreg, + systolic murmur, rubs, or gallops.  Respiratory: Diminished in RLL GI: Soft, nontender, non-distended  MS: No edema; No deformity. Neuro:  Nonfocal  Psych: Normal affect   Labs    High Sensitivity Troponin:   Recent Labs  Lab 10/05/20 1347 10/05/20 1713 10/19/20 2115 10/20/20 0043  TROPONINIHS 14 16 25* 30*      Chemistry Recent Labs  Lab 10/24/20 0134 10/25/20 0126 10/26/20 0639  NA 128*  129* 134*  K 4.3 4.9 4.5  CL 97* 98 100  CO2 23 19* 21*  GLUCOSE 118* 106* 116*  BUN 49* 55* 58*  CREATININE 0.98 0.97 1.07*  CALCIUM 8.1* 8.4* 8.4*  PROT 5.8* 5.9* 5.8*  ALBUMIN 2.2* 2.2* 2.2*  AST 682* 557* 490*  ALT 472* 443* 439*  ALKPHOS 188* 210* 209*  BILITOT 1.3* 1.6* 1.7*  GFRNONAA >60 >60 54*  ANIONGAP 8 12 13      Hematology Recent Labs  Lab 10/24/20 0134 10/25/20 0126 10/26/20 0639  WBC 13.1* 12.2* 12.8*  RBC 4.12 4.26 4.20  HGB 13.4 14.0 13.8  HCT 39.7 41.2 40.4  MCV 96.4 96.7 96.2  MCH 32.5 32.9 32.9  MCHC 33.8 34.0 34.2  RDW 15.5 15.6* 16.0*  PLT 470* 406* 398    BNP Recent Labs  Lab 10/23/20 0805 10/24/20 0134 10/25/20 0641  BNP 1,337.4* 1,340.8* 1,137.5*     DDimer No results for input(s): DDIMER in the last 168 hours.   Radiology    NM Hepatobiliary Liver Func  Result Date: 10/24/2020 CLINICAL DATA:  Abdominal pain nausea EXAM: NUCLEAR MEDICINE HEPATOBILIARY IMAGING TECHNIQUE: Sequential images of the abdomen were obtained out to 60 minutes following intravenous administration  of radiopharmaceutical. RADIOPHARMACEUTICALS:  5.5 mCi Tc-48m  Choletec IV COMPARISON:  Ultrasound 10/23/2020 FINDINGS: Prompt uptake and biliary excretion of activity by the liver is seen. Gallbladder activity is visualized, consistent with patency of cystic duct. Delayed visualization of common bile duct activity and delayed biliary to bowel transit which is visualized by end of the second hour of imaging. IMPRESSION: 1. Negative for acute gallbladder disease. 2. Nonspecific delayed biliary to bowel transit with slight stasis of activity in the common bile duct, no ductal dilatation noted on prior ultrasound. If there is concern for ductal obstruction, correlation with MRCP could be obtained. Electronically Signed   By: Jasmine Pang M.D.   On: 10/24/2020 19:32   US RENAL  Result Date: 10/24/2020 CLINICAL DATA:  Acute renal insufficiency EXAM: RENAL / URINARY TRACT  ULTRASOUND COMPLETE COMPARISON:  Abdominal ultrasound October 23, 2020 FINDINGS: Right Kidney: Renal measurements: 9.7 x 4.0 x 4.5 cm = volume: 91 mL. Mild to moderate hydronephrosis, unchanged in the interval. Left Kidney: Renal measurements: 7.2 x 4.8 x 5.3 cm = volume: 96 mL. Echogenicity within normal limits. No mass or hydronephrosis visualized. Bladder: Appears normal for degree of bladder distention. Other: There is a left pleural effusion. IMPRESSION: 1. Moderate right hydronephrosis of uncertain chronicity and etiology is again identified, unchanged since yesterday's study. 2. Left pleural effusion. Electronically Signed   By: Gerome Sam III M.D   On: 10/24/2020 13:47   DG CHEST PORT 1 VIEW  Result Date: 10/26/2020 CLINICAL DATA:  Shortness of breath. EXAM: PORTABLE CHEST 1 VIEW COMPARISON:  10/25/2020.  CT 10/09/2016. FINDINGS: Stable cardiomegaly. Persistent right base atelectasis. Persistent bibasilar infiltrates/edema and bilateral pleural effusions without interim change. Biapical pleural thickening consistent scarring. No pneumothorax. Thoracic spine scoliosis and degenerative change again noted. IMPRESSION: 1.  Stable cardiomegaly. 2. Persistent right base atelectasis. Persistent bibasilar infiltrates/edema and small bilateral pleural effusions. Electronically Signed   By: Maisie Fus  Register   On: 10/26/2020 07:57   DG Chest Port 1 View  Result Date: 10/25/2020 CLINICAL DATA:  Shortness of breath EXAM: PORTABLE CHEST 1 VIEW COMPARISON:  10/23/2020 FINDINGS: Cardiac shadow is within normal limits. Aortic calcifications are again seen. Increasing right-sided effusion and basilar opacity is seen. Stable left effusion is noted. No pneumothorax is seen. No bony abnormality is noted. IMPRESSION: Increasing right-sided pleural effusion with underlying basilar opacity. Stable small left effusion. Electronically Signed   By: Alcide Clever M.D.   On: 10/25/2020 11:59   ECHO TEE  Result Date:  10/25/2020    TRANSESOPHOGEAL ECHO REPORT   Patient Name:   Laurie James Date of Exam: 10/25/2020 Medical Rec #:  [id number]                   Height:       56.0 in Accession #:    [id number]                  Weight:       102.3 lb Date of Birth:  Jan 01, 1946                   BSA:          1.335 m Patient Age:    75 years                    BP:           109/73 mmHg Patient Gender: F  HR:           96 bpm. Exam Location:  Inpatient Procedure: 2D Echo, Cardiac Doppler and Color Doppler Indications:     Atrial fibrillation  History:         Patient has prior history of Echocardiogram examinations. CAD,                  COPD and Pulmonary HTN, Arrythmias:Atrial Fibrillation; Risk                  Factors:Dyslipidemia and Current Smoker.  Sonographer:     Ross Ludwig RDCS (AE) Referring Phys:  [id number] Roe Rutherford [facility] Diagnosing Phys: Armanda Magic MD PROCEDURE: After discussion of the risks and benefits of a TEE, an informed consent was obtained from the patient. The transesophogeal probe was passed without difficulty through the esophogus of the patient. Sedation performed by different physician. The patient was monitored while under deep sedation. Anesthestetic sedation was provided intravenously by Anesthesiology: 63.8mg  of Propofol. Image quality was adequate. The patient's vital signs; including heart rate, blood pressure, and oxygen saturation; remained stable throughout the procedure. The patient developed no complications during the procedure. IMPRESSIONS  1. Left ventricular ejection fraction, by estimation, is 20 to 25%. The left ventricle has severely decreased function. The left ventricle demonstrates global hypokinesis with severe spontaneous echo contrast.  2. Right ventricular systolic function is moderately reduced. The right ventricular size is normal.  3. Left atrial size was severely dilated. There was severe spontaneous echo contrast in both the LA and the LA  appnedage consistent with very sluggish flow and increased risk of thrombus formation. There is suspicion for left atrial appendage thrombus in the mouth of the appendage.  4. Right atrial size was Massively dilated with severe spontaneos echo contrast.  5. The mitral valve is normal in structure. Trivial mitral valve regurgitation. No evidence of mitral stenosis.  6. Tricuspid valve regurgitation is severe related to TV annular dilatation.  7. The aortic valve is normal in structure. Aortic valve regurgitation is trivial. No aortic stenosis is present. Conclusion(s)/Recommendation(s):Severe LV dysfunction, moderate RV dysfunction, severe LAE with severe Spontaneous echo contrast and possible LAA thrombus, massive RAE and severe TR FINDINGS  Left Ventricle: Left ventricular ejection fraction, by estimation, is 20 to 25%. The left ventricle has severely decreased function. The left ventricle demonstrates global hypokinesis. The left ventricular internal cavity size was normal in size. There is no left ventricular hypertrophy. Right Ventricle: The right ventricular size is normal. No increase in right ventricular wall thickness. Right ventricular systolic function is moderately reduced. Left Atrium: Left atrial size was severely dilated. Spontaneous echo contrast was present in the left atrium and left atrial appendage. A left atrial/left atrial appendage thrombus was detected. Right Atrium: Right atrial size was Massively dilated with severe spontaneos echo contrast. Pericardium: There is no evidence of pericardial effusion. Mitral Valve: The mitral valve is normal in structure. Trivial mitral valve regurgitation. No evidence of mitral valve stenosis. Tricuspid Valve: The tricuspid valve is normal in structure. Tricuspid valve regurgitation is severe. No evidence of tricuspid stenosis. Aortic Valve: The aortic valve is normal in structure. Aortic valve regurgitation is trivial. No aortic stenosis is present. Pulmonic  Valve: The pulmonic valve was normal in structure. Pulmonic valve regurgitation is trivial. No evidence of pulmonic stenosis. Aorta: The aortic root is normal in size and structure. Venous: The left upper pulmonary vein is normal. A normal flow pattern is recorded from the left upper pulmonary  vein. IAS/Shunts: No atrial level shunt detected by color flow Doppler. Armanda Magic MD Electronically signed by Armanda Magic MD Signature Date/Time: 10/25/2020/3:19:50 PM    Final     Cardiac Studies   Echo: 10/25/20  IMPRESSIONS     1. Left ventricular ejection fraction, by estimation, is 20 to 25%. The  left ventricle has severely decreased function. The left ventricle  demonstrates global hypokinesis with severe spontaneous echo contrast.   2. Right ventricular systolic function is moderately reduced. The right  ventricular size is normal.   3. Left atrial size was severely dilated. There was severe spontaneous  echo contrast in both the LA and the LA appnedage consistent with very  sluggish flow and increased risk of thrombus formation. There is suspicion  for left atrial appendage thrombus  in the mouth of the appendage.   4. Right atrial size was Massively dilated with severe spontaneos echo  contrast.   5. The mitral valve is normal in structure. Trivial mitral valve  regurgitation. No evidence of mitral stenosis.   6. Tricuspid valve regurgitation is severe related to TV annular  dilatation.   7. The aortic valve is normal in structure. Aortic valve regurgitation is  trivial. No aortic stenosis is present.   Conclusion(s)/Recommendation(s):Severe LV dysfunction, moderate RV  dysfunction, severe LAE with severe Spontaneous echo contrast and possible  LAA thrombus, massive RAE and severe TR  FINDINGS   Left Ventricle: Left ventricular ejection fraction, by estimation, is 20  to 25%. The left ventricle has severely decreased function. The left  ventricle demonstrates global hypokinesis.  The left ventricular internal  cavity size was normal in size. There  is no left ventricular hypertrophy.   Right Ventricle: The right ventricular size is normal. No increase in  right ventricular wall thickness. Right ventricular systolic function is  moderately reduced.   Left Atrium: Left atrial size was severely dilated. Spontaneous echo  contrast was present in the left atrium and left atrial appendage. A left  atrial/left atrial appendage thrombus was detected.   Right Atrium: Right atrial size was Massively dilated with severe  spontaneos echo contrast.   Pericardium: There is no evidence of pericardial effusion.   Mitral Valve: The mitral valve is normal in structure. Trivial mitral  valve regurgitation. No evidence of mitral valve stenosis.   Tricuspid Valve: The tricuspid valve is normal in structure. Tricuspid  valve regurgitation is severe. No evidence of tricuspid stenosis.   Aortic Valve: The aortic valve is normal in structure. Aortic valve  regurgitation is trivial. No aortic stenosis is present.   Pulmonic Valve: The pulmonic valve was normal in structure. Pulmonic valve  regurgitation is trivial. No evidence of pulmonic stenosis.   Aorta: The aortic root is normal in size and structure.   Venous: The left upper pulmonary vein is normal. A normal flow pattern is  recorded from the left upper pulmonary vein.   IAS/Shunts: No atrial level shunt detected by color flow Doppler.   Armanda Magic MD  Electronically signed by Armanda Magic MD  Signature Date/Time: 10/25/2020/3:19:50 PM   Patient Profile     75 y.o. female with PMH of CAD, chronic diastolic heart failure, pulmonary hypertension, severe TR, smoker, COPD, HTN, HLD, GERD, hiatal hernia and anxiety who is admitted for SOB and failure to thrive. Felt to have volume overload in the setting of atrial fibrillation with RVR.   Assessment & Plan    Acute on Chronic combined CHF: with moderate RV dysfunction:  TEE  yesterday shows severely reduced EF of 20-25% compared to Echo 6/29. BNP elevated on admission 1542>>990 today. CXR shows re-accumulation of small right sided pleural effusion.  -- net + 3.3L -- will stop IV diltiazem -- on low dose metoprolol 25mg  BID, will not further increase with concern for low output HF. May need amiodarone for rate control -- place PICC, check Coox. May need inotrope support -- may need right heart  -- IV lasix 40mg  BID -- AHF consult  LA thrombus: noted on TEE yesterday. Will continue on IV heparin for now until certain no further invasive work up needed.   Atrial Fibrillation with RVR: rates mostly in the 70-80s, some episodes in the 100s at times. Severe biatrial enlargement. Initially plan was for TEE/DCCV yesterday but canceled in the setting of LA thrombus. -- as above, will hold on further increase in BB with possible low output -- if further rate control needed, then consider amiodarone  -- on dig 0.0625mg  daily, dig level pending  Large right pleural effusion: s/p R thoracentesis on 7/13 with removal of 1.7L of amber-colored fluid  Elevated transaminase: AST 682, ALT 472, now trending down AST 490, ALT 439. Suspect in the setting of low output HF.  -- elevated by GI felt to be related to CHF -- HIDA scan negative for cholecystitis, surgery signed off  CAD: cath 6/30 with mild non-obstructive disease   HTN: soft at times, but stable.  HLD: statin held in the setting of elevated LFTs   Severe TR: unchanged from prior echo 6/29  For questions or updates, please contact [facility] Please consult [email or web address] for contact info under        Signed, 7/30, NP  10/26/2020, 9:05 AM

## 2020-10-26 NOTE — Progress Notes (Signed)
OT Cancellation Note  Patient Details Name: Laurie James MRN: [id number] DOB: [birth year]   Cancelled Treatment:    Reason Eval/Treat Not Completed: Medical issues which prohibited therapy (TEE showing new cardiac findings, and pt in rapid  A. fib, moved to caridac floor from 6E; OT cancel tx session today and will resume tx as pt is medically stable)  [name] A Fritz Cauthon 10/26/2020, 4:11 PM

## 2020-10-26 NOTE — Progress Notes (Signed)
PROGRESS NOTE  Cordy Goll Glorietta Perilli WSF:[id number] DOB: [birth year] DOA: 10/19/2020 PCP: Georgann Housekeeper, MD  HPI/Recap of past 24 hours: 75 y.o. female with medical history significant for recent hospitalization and discharge a week ago with newly diagnosed A. fib, plus history of COPD, hypertension presented to the ED on 7/12 with weakness, shortness of breath, cough and found to have pneumonia ane bilateral pleural effusions.  Started on IV antibiotics and admitted to the hospitalist service. Associated with generalized weakness, semi productive cough, chills, poor oral intake.  Following admission, interventional radiology removed 1.7 L of fluid from right lung by thoracentesis.  Patient's breathing improved.  Blood pressure became quite soft on 7/14.  Patient complaining of feeling very weak and nauseated.  Also developed some rapid atrial fibrillation.  He subsequently received IV fluids.  Cardiology following.  Monitor.   Subjective. Patient in bed, appears comfortable, denies any headache, no fever, no chest pain or pressure, mild shortness of breath , no abdominal pain. No new focal weakness.   Assessment/Plan:   Acute hypoxic respiratory failure causing shortness of breath due to bilateral pleural effusions/Acute on chronic combined systolic and diastolic CHF EF on 10/06/2020 of 60% now EF dropped to 25% on 10/25/2020.  Status postthoracentesis with 1.7 L transudative fluid removed on 10/20/2020, no malignant cells yet, unfortunately fluid has reaccumulated however she has no symptoms, currently on room air and minimally short of breath.    She seems to have developed some pleural effusions again but TEE done on 10/26/2020 shows a significant drop in EF from 60% to 25% few weeks with global hypokinesis and possible left atrial appendage thrombus.  Currently being followed by advanced heart failure team and placed on milrinone and Levophed drip on 10/26/2020, diuresis per cardiology, for  further CHF management to cardiology team.  Question ischemic cardiomyopathy versus rate related cardiomyopathy.    Paroxysmal A. fib [place] vas 2 score of greater than 3.  Was on Cardizem drip, TSH was stable, continue as needed IV Lopressor, now off of Cardizem drip, rate control per cardiology, underwent TEE but could not undergo cardioversion as there was question of left atrial appendage thrombus, continue heparin drip for anticoagulation, defer management of cardiac issues to cardiology which is following the patient closely.   CAD  Stable during this hospitalization.  Transaminitis with possible cholecystitis.  Reason unclear, intermittent right upper quadrant discomfort according to the patient in the past, right upper quadrant ultrasound suggestive of cholecystitis but HIDA scan negative, since LFTs have gone up significantly we will request GI to evaluate, per GI mostly consistent with hepatic congestion.  Currently symptom-free will monitor.  HCAP (healthcare-associated pneumonia): Sepsis was ruled out likely cause of leukocytosis was possible cholecystitis above.  Ongoing history of smoking along with underlying history of COPD - she still to quit smoking, supportive care for COPD no acute issues.  Nausea: Improved with supportive care.  No nausea vomiting.  Pulmonary HTN (HCC) - supportive care outpatient follow-up with pulmonary.  HLD (hyperlipidemia) - home dose statin on hold due to transaminitis.    Depression: Stable, cont home meds  Hyponatremia: likely dilutional in the context of acute heart failure - will obtain serum osmolality along with urine sodium and osmolality along with uric acid  Right-sided incidental mild hydronephrosis.  With stable renal function.  Outpatient urology follow-up post discharge.    Severe protein calorie malnutrition: Seen by nutrition. Nutrition Status: Nutrition Problem: Severe Malnutrition Etiology: chronic illness (COPD,  CAD) Signs/Symptoms: severe  muscle depletion, severe fat depletion Interventions: Ensure Enlive (each supplement provides 350kcal and 20 grams of protein), Hormel Shake, MVI     Code Status: Full Code   Family Communication: son at bedside 10/23/20, family bedside 10/24/2020, 10/25/2020, daughter bedside on 10/26/2020  Disposition Plan: anticipate discharge hopefully this weekend once atrial fibrillation controlled   Consultants: Int Radiology  [facility] GI Cards  Procedures:  Thoracentesis done 7/13 - 1.7 lit transudative fluid, no malignant cells, LDH >117  Right upper quadrant ultrasound 10/23/20 -  with possible cholecystitis  HIDA scan with EF 10/24/2020 - 1. Negative for acute gallbladder disease. 2. Nonspecific delayed biliary to bowel transit with slight stasis of activity in the common bile duct, no ductal dilatation noted on prior ultrasound. If there is concern for ductal obstruction, correlation with MRCP could be obtained.  Renal ultrasound 10/24/20.  1. Moderate right hydronephrosis of uncertain chronicity and etiology is again identified, unchanged since yesterday's study. 2. Left pleural effusion.   TEE  10/25/2020 - 1. Left ventricular ejection fraction, by estimation, is 20 to 25%. The left ventricle has severely decreased function. The left ventricle demonstrates global hypokinesis with severe spontaneous echo contrast.  2. Right ventricular systolic function is moderately reduced. The right ventricular size is normal.  3. Left atrial size was severely dilated. There was severe spontaneous echo contrast in both the LA and the LA appnedage consistent with very sluggish flow and increased risk of thrombus formation. There is suspicion for left atrial appendage thrombus in the mouth of the appendage.  4. Right atrial size was Massively dilated with severe spontaneos echo contrast.  5. The mitral valve is normal in structure. Trivial mitral valve regurgitation. No evidence of mitral  stenosis.  6. Tricuspid valve regurgitation is severe related to TV annular dilatation.  7. The aortic valve is normal in structure. Aortic valve regurgitation is trivial. No aortic stenosis is present.  - PICC 10/26/20    Antimicrobials: IV Rocephin and Zithromax 7/13-7/14 Unasyn 7/17  DVT prophylaxis:  Eliquis >> Hep gtt  Level of care: ICU   Objective: Vitals:   10/26/20 1111 10/26/20 1130  BP: 95/73   Pulse:  90  Resp: (!) 29   Temp:  (!) 96.7 F (35.9 C)  SpO2:      Intake/Output Summary (Last 24 hours) at 10/26/2020 1452 Last data filed at 10/26/2020 1400 Gross per 24 hour  Intake 967.7 ml  Output 200 ml  Net 767.7 ml   Filed Weights   10/23/20 0451 10/24/20 0411 10/25/20 0453  Weight: 49.1 kg 46 kg 46.4 kg   Body mass index is 22.94 kg/m.  Exam:  Awake Alert, No new F.N deficits, Normal affect [place].AT,PERRAL Supple Neck,No JVD, No cervical lymphadenopathy appriciated.  Symmetrical Chest wall movement, Good air movement bilaterally, +ve rales RRR,No Gallops, Rubs or new Murmurs, No Parasternal Heave +ve B.Sounds, Abd Soft, No tenderness, No organomegaly appriciated, No rebound - guarding or rigidity. No Cyanosis, Clubbing or edema, No new Rash or bruise    Data Reviewed:  Recent Labs  Lab 10/19/20 2115 10/20/20 0331 10/22/20 0242 10/23/20 0805 10/24/20 0134 10/25/20 0126 10/26/20 0639  WBC 14.8*   < > 14.6* 13.8* 13.1* 12.2* 12.8*  HGB 13.9   < > 13.3 13.7 13.4 14.0 13.8  HCT 41.5   < > 39.3 40.5 39.7 41.2 40.4  PLT 558*   < > 535* 489* 470* 406* 398  MCV 97.9   < > 96.1 97.1 96.4 96.7 96.2  MCH 32.8   < > 32.5 32.9 32.5 32.9 32.9  MCHC 33.5   < > 33.8 33.8 33.8 34.0 34.2  RDW 15.0   < > 15.3 15.5 15.5 15.6* 16.0*  LYMPHSABS 0.6*  --   --   --  0.7 1.0 0.8  MONOABS 1.0  --   --   --  0.9 1.0 1.0  EOSABS 0.0  --   --   --  0.1 0.1 0.1  BASOSABS 0.0  --   --   --  0.0 0.0 0.0   < > = values in this interval not displayed.    Recent Labs   Lab 10/20/20 0331 10/21/20 0129 10/22/20 0242 10/23/20 0805 10/24/20 0134 10/24/20 1124 10/24/20 1611 10/25/20 0126 10/25/20 0641 10/26/20 0639 10/26/20 1002  NA 129* 128* 129* 131* 128*  --   --  129*  --  134*  --   K 4.5 4.3 4.4 4.8 4.3  --   --  4.9  --  4.5  --   CL 97* 94* 99 99 97*  --   --  98  --  100  --   CO2 20* 23 21* 23 23  --   --  19*  --  21*  --   GLUCOSE 147* 133* 111* 128* 118*  --   --  106*  --  116*  --   BUN 24* 32* 41* 48* 49*  --   --  55*  --  58*  --   CREATININE 0.86 1.15* 1.16* 1.05* 0.98  --   --  0.97  --  1.07*  --   CALCIUM 8.6* 8.5* 8.0* 8.4* 8.1*  --   --  8.4*  --  8.4*  --   AST 154* 197* 358*  --  682*  --   --  557*  --  490*  --   ALT 101* 133* 224*  --  472*  --   --  443*  --  439*  --   ALKPHOS 144* 151* 155*  --  188*  --   --  210*  --  209*  --   BILITOT 1.6* 1.5* 1.3*  --  1.3*  --   --  1.6*  --  1.7*  --   ALBUMIN 2.7* 2.4* 2.3*  --  2.2*  --   --  2.2*  --  2.2*  --   MG 2.0  --   --  2.2 2.2  --   --  2.4  --  2.6*  --   PROCALCITON <0.10  --   --  0.19 0.19  --   --  0.24  --   --   --   LATICACIDVEN  --   --   --   --   --   --   --   --   --   --  2.3*  INR  --   --   --   --   --   --  4.8*  --   --   --   --   TSH  --   --   --   --   --  3.795  --   --   --   --   --   BNP  --   --  1,542.5* 1,337.4* 1,340.8*  --   --   --  1,137.5* 990.2*  --  Studies: DG CHEST PORT 1 VIEW  Result Date: 10/26/2020 CLINICAL DATA:  S/P PICC central line placement EXAM: PORTABLE CHEST - 1 VIEW COMPARISON:  10/26/2020 FINDINGS: Right arm PICC line extends to mid SVC. Moderate right and small left pleural effusions, with adjacent consolidation/atelectasis in the lung bases as before. Aortic Atherosclerosis (ICD10-170.0). Mild thoracic levoscoliosis. No pneumothorax. IMPRESSION: 1. PICC line to the mid SVC. 2. Stable pleural effusions with adjacent consolidation/atelectasis, right worse than left. Electronically Signed   By: Corlis Leak  M.D.   On: 10/26/2020 13:04   DG CHEST PORT 1 VIEW  Result Date: 10/26/2020 CLINICAL DATA:  Shortness of breath. EXAM: PORTABLE CHEST 1 VIEW COMPARISON:  10/25/2020.  CT 10/09/2016. FINDINGS: Stable cardiomegaly. Persistent right base atelectasis. Persistent bibasilar infiltrates/edema and bilateral pleural effusions without interim change. Biapical pleural thickening consistent scarring. No pneumothorax. Thoracic spine scoliosis and degenerative change again noted. IMPRESSION: 1.  Stable cardiomegaly. 2. Persistent right base atelectasis. Persistent bibasilar infiltrates/edema and small bilateral pleural effusions. Electronically Signed   By: Maisie Fus  Register   On: 10/26/2020 07:57   ECHO TEE  Result Date: 10/25/2020    TRANSESOPHOGEAL ECHO REPORT   Patient Name:   [facility] ANN [name] Date of Exam: 10/25/2020 Medical Rec #:  [id number]                   Height:       56.0 in Accession #:    [id number]                  Weight:       102.3 lb Date of Birth:  11-23-1945                   BSA:          1.335 m Patient Age:    75 years                    BP:           109/73 mmHg Patient Gender: F                           HR:           96 bpm. Exam Location:  Inpatient Procedure: 2D Echo, Cardiac Doppler and Color Doppler Indications:     Atrial fibrillation  History:         Patient has prior history of Echocardiogram examinations. CAD,                  COPD and Pulmonary HTN, Arrythmias:Atrial Fibrillation; Risk                  Factors:Dyslipidemia and Current Smoker.  Sonographer:     Ross Ludwig RDCS (AE) Referring Phys:  [id number] Roe Rutherford [facility] Diagnosing Phys: Armanda Magic MD PROCEDURE: After discussion of the risks and benefits of a TEE, an informed consent was obtained from the patient. The transesophogeal probe was passed without difficulty through the esophogus of the patient. Sedation performed by different physician. The patient was monitored while under deep sedation. Anesthestetic  sedation was provided intravenously by Anesthesiology: 63.8mg  of Propofol. Image quality was adequate. The patient's vital signs; including heart rate, blood pressure, and oxygen saturation; remained stable throughout the procedure. The patient developed no complications during the procedure. IMPRESSIONS  1. Left ventricular ejection fraction, by estimation, is 20 to 25%. The left ventricle has severely  decreased function. The left ventricle demonstrates global hypokinesis with severe spontaneous echo contrast.  2. Right ventricular systolic function is moderately reduced. The right ventricular size is normal.  3. Left atrial size was severely dilated. There was severe spontaneous echo contrast in both the LA and the LA appnedage consistent with very sluggish flow and increased risk of thrombus formation. There is suspicion for left atrial appendage thrombus in the mouth of the appendage.  4. Right atrial size was Massively dilated with severe spontaneos echo contrast.  5. The mitral valve is normal in structure. Trivial mitral valve regurgitation. No evidence of mitral stenosis.  6. Tricuspid valve regurgitation is severe related to TV annular dilatation.  7. The aortic valve is normal in structure. Aortic valve regurgitation is trivial. No aortic stenosis is present. Conclusion(s)/Recommendation(s):Severe LV dysfunction, moderate RV dysfunction, severe LAE with severe Spontaneous echo contrast and possible LAA thrombus, massive RAE and severe TR FINDINGS  Left Ventricle: Left ventricular ejection fraction, by estimation, is 20 to 25%. The left ventricle has severely decreased function. The left ventricle demonstrates global hypokinesis. The left ventricular internal cavity size was normal in size. There is no left ventricular hypertrophy. Right Ventricle: The right ventricular size is normal. No increase in right ventricular wall thickness. Right ventricular systolic function is moderately reduced. Left Atrium:  Left atrial size was severely dilated. Spontaneous echo contrast was present in the left atrium and left atrial appendage. A left atrial/left atrial appendage thrombus was detected. Right Atrium: Right atrial size was Massively dilated with severe spontaneos echo contrast. Pericardium: There is no evidence of pericardial effusion. Mitral Valve: The mitral valve is normal in structure. Trivial mitral valve regurgitation. No evidence of mitral valve stenosis. Tricuspid Valve: The tricuspid valve is normal in structure. Tricuspid valve regurgitation is severe. No evidence of tricuspid stenosis. Aortic Valve: The aortic valve is normal in structure. Aortic valve regurgitation is trivial. No aortic stenosis is present. Pulmonic Valve: The pulmonic valve was normal in structure. Pulmonic valve regurgitation is trivial. No evidence of pulmonic stenosis. Aorta: The aortic root is normal in size and structure. Venous: The left upper pulmonary vein is normal. A normal flow pattern is recorded from the left upper pulmonary vein. IAS/Shunts: No atrial level shunt detected by color flow Doppler. Armanda Magic MD Electronically signed by Armanda Magic MD Signature Date/Time: 10/25/2020/3:19:50 PM    Final    [place] EKG SITE RITE  Result Date: 10/26/2020 If Site Rite image not attached, placement could not be confirmed due to current cardiac rhythm.   Scheduled Meds:  Chlorhexidine Gluconate Cloth  6 each Topical Daily   citalopram  20 mg Oral QHS   feeding supplement  237 mL Oral TID BM   multivitamin with minerals  1 tablet Oral Daily   pantoprazole  40 mg Oral Daily   sodium chloride flush  10-40 mL Intracatheter Q12H    Continuous Infusions:  cefTRIAXone (ROCEPHIN)  IV     heparin 600 Units/hr (10/26/20 1200)   milrinone 0.25 mcg/kg/min (10/26/20 1353)   norepinephrine (LEVOPHED) Adult infusion 2 mcg/min (10/26/20 1357)     LOS: 7 days   Signature  Susa Raring M.D on 10/26/2020 at 2:52 PM   -  To page go  to [email or web address]

## 2020-10-26 NOTE — Progress Notes (Signed)
  AHF/SHOCK Team Note:  Patient seen on PM rounds earlier this evening.   Now on milrinone 0.25.   Co-ox 38% -> 50%.  Breathing much better. Lactic acid down to 1.7. Given IV lasix and has now put out about 1L   SBP ~110.   Now on IV amio and heparin. Ventricular rate in 90s.   I updated her son at the bedside. We discussed that she was improving but still at significant risk for embolic events given degree of stasis seen on TEE. Will continue heparin. Dosing reviewed with PharmD.   Additional CCT 35 mins.   Arvilla Meres, MD  11:37 PM

## 2020-10-26 NOTE — Consult Note (Signed)
Advanced Heart Failure Team Consult Note   Primary Physician: Laurie Housekeeper, MD PCP-Cardiologist:  Laurie Haws, MD  Reason for Consultation: New cardiomyopathy   HPI:    Laurie James is seen today for evaluation of new cardiomyopathy at the request of Laurie James.  The patient is a 75 y/o Caucasian female with a medical history significant for chronic diastolic heart failure, atrial fibrillation (newly diagnosed in June 2022 and started on Eliquis, digoxin and metoprolol), hypertension and COPD who presented to [facility] on 10/19/2020 for evaluation of dyspnea, cough and weakness.  Labs were significant for leukocytosis >14, HS troponin of 25 and a BNP of 895.8 (733 on 6/28).  Imaging was notable for a significant right-sided pleural effusion and she underwent a thoracentesis with removal of 1.7L of amber-colored fluid on 7/13.  She was also noted to be in afib with some rates in the low 100s-110s and cardiology was consulted to evaluate further.  She was started on a diltiazem drip for rate control with a planned TEE/DCCV.  The TEE on 10/25/2020 noted an  EF of 20 to 25% (last echo on 6/29 showed normal EF of 55 to 60%) with global hypokinesis, moderately reduced RV function, severely dilated atria and concern for a LAA thrombus.  Because of the concern for the thrombus, the DCCV was deferred and she was started on a heparin drip.  On 7/19, there was concern for developing shock due to cool extremities and no UOP, so the advanced HF team was consulted.  Diltiazem and metoprolol were discontinued on 7/19 due to low EF and concern for shock.  Her course has also been complicated by transaminitis with AST/AL peaking at 682 and 472 and a total bilirubin as high as 1.8.  She was hospitalized at the end of June and found to be in new-onset afib.  At that time, she was started on diltiazem, metoprolol and Eliquis.   Prior cardiac diagnostics: Echo 10/06/20: 1. Left ventricular ejection  fraction, by estimation, is 55 to 60%. The  left ventricle has normal function. The left ventricle has no regional  wall motion abnormalities. Left ventricular diastolic parameters are  consistent with Grade II diastolic  dysfunction (pseudonormalization).   2. Right ventricular systolic function is mildly reduced. The right  ventricular size is moderately enlarged. There is mildly elevated  pulmonary artery systolic pressure. The estimated right ventricular  systolic pressure is 44.2 mmHg.   3. Left atrial size was severely dilated.   4. Right atrial size was severely dilated.   5. The mitral valve is normal in structure. Mild mitral valve  regurgitation. No evidence of mitral stenosis.   6. Tricuspid valve regurgitation is severe.   7. The aortic valve is grossly normal. There is mild calcification of the  aortic valve. Aortic valve regurgitation is mild. No aortic stenosis is  present.   8. Pulmonic valve regurgitation is moderate.   9. The inferior vena cava is dilated in size with <50% respiratory  variability, suggesting right atrial pressure of 15 mmHg.     Right and left heart cath 10/07/20: IMPRESSION: Laurie James has nonobstructive coronary artery disease.  She did have a spasm of the ostium of her large dominant RCA without damping.  The initial nonselective shot did not show a tight stenosis.  She did have 40% segmental mid RCA stenosis as well as 50 to 60% segmental second diagonal branch stenosis all of which can be treated medically.  Her systolic pressure  was low as was her EDP and filling pressures.  She will need to be treated with a DOAC for her A. fib.  Medical therapy otherwise.  The radial sheath was removed and a TR band was placed on the right wrist to achieve patent hemostasis.  The patient left the lab in stable condition.   Review of Systems: [y] = yes, [ ]  = no  Negative unless reported in HPI.  She denies chest pain, shortness of breath, palpitations and dizziness  at this time.   Home Medications Prior to Admission medications   Medication Sig Start Date End Date Taking? Authorizing Provider  albuterol (VENTOLIN HFA) 108 (90 Base) MCG/ACT inhaler Inhale 2 puffs into the lungs every 4 (four) hours as needed for wheezing. 06/08/20  Yes [provider]  apixaban (ELIQUIS) 5 MG TABS tablet Take 1 tablet (5 mg total) by mouth 2 (two) times daily. 10/11/20  Yes Georgie ChardMcDaniel, Jill D, NP  atorvastatin (LIPITOR) 40 MG tablet Take 1 tablet (40 mg total) by mouth daily. 10/12/20  Yes Georgie ChardMcDaniel, Jill D, NP  citalopram (CELEXA) 20 MG tablet Take 20 mg by mouth at bedtime. 06/08/20  Yes [provider]  cyanocobalamin (,VITAMIN B-12,) 1000 MCG/ML injection Inject 1,000 mcg into the muscle every 30 (thirty) days. 06/11/16  Yes [provider]  digoxin (LANOXIN) 0.125 MG tablet Take 1 tablet (0.125 mg total) by mouth daily. 10/12/20  Yes Georgie ChardMcDaniel, Jill D, NP  diltiazem (CARDIZEM CD) 180 MG 24 hr capsule Take 1 capsule (180 mg total) by mouth daily. 10/12/20  Yes Georgie ChardMcDaniel, Jill D, NP  esomeprazole (NEXIUM) 40 MG capsule Take 40 mg by mouth daily.   Yes [provider]  metoprolol tartrate (LOPRESSOR) 25 MG tablet Take 0.5 tablets (12.5 mg total) by mouth 2 (two) times daily. 10/11/20  Yes Georgie ChardMcDaniel, Jill D, NP  nitroGLYCERIN (NITROSTAT) 0.4 MG SL tablet Place 1 tablet (0.4 mg total) under the tongue every 5 (five) minutes x 3 doses as needed for chest pain. 10/11/20  Yes Georgie ChardMcDaniel, Jill D, NP  promethazine (PHENERGAN) 25 MG tablet Take 25 mg by mouth 3 (three) times daily as needed for nausea. 04/17/16  Yes [provider]    Past Medical History: Past Medical History:  Diagnosis Date   AMD (age related macular degeneration)    COPD (chronic obstructive pulmonary disease) (HCC)    Depression    Hiatal hernia    Intermittent low back pain    Memory impairment    Midsternal chest pain    a. 12/2011 Cardiac CTA Ca++ score of 103.3 (80th %), LAD  <50p/m, RCA 50-75.   Osteoporosis    Vitamin B 12 deficiency     Past Surgical History: Past Surgical History:  Procedure Laterality Date   ABDOMINAL HYSTERECTOMY     IR THORACENTESIS ASP PLEURAL SPACE W/IMG GUIDE  10/20/2020   RIGHT/LEFT HEART CATH AND CORONARY ANGIOGRAPHY N/A 10/07/2020   Procedure: RIGHT/LEFT HEART CATH AND CORONARY ANGIOGRAPHY;  Surgeon: Runell GessBerry, Jonathan J, MD;  Location: [facility] INVASIVE CV LAB;  Service: Cardiovascular;  Laterality: N/A;   TEE WITHOUT CARDIOVERSION N/A 10/25/2020   Procedure: TRANSESOPHAGEAL ECHOCARDIOGRAM (TEE);  Surgeon: Quintella Reicherturner, Traci R, MD;  [facility];  Service: Cardiovascular;  Laterality: N/A;    Family History: Family History  Problem Relation Age of Onset   Breast cancer Sister    Breast cancer Sister     Social History: Social History   Socioeconomic History   Marital status: Married  Spouse name: Not on file   Number of children: Not on file   Years of education: Not on file   Highest education level: Not on file  Occupational History   Not on file  Tobacco Use   Smoking status: Every Day    Packs/day: 1.00    Years: 50.00    Pack years: 50.00    Types: Cigarettes   Smokeless tobacco: Never  Substance and Sexual Activity   Alcohol use: No    Alcohol/week: 0.0 standard drinks    Comment: rarely   Drug use: No   Sexual activity: Not on file  Other Topics Concern   Not on file  Social History Narrative   Not on file   Social Determinants of Health   Financial Resource Strain: Not on file  Food Insecurity: Not on file  Transportation Needs: Not on file  Physical Activity: Not on file  Stress: Not on file  Social Connections: Not on file    Allergies:  Allergies  Allergen Reactions   Codeine Nausea And Vomiting    Objective:    Vital Signs:   Temp:  [94.2 F (34.6 C)-98.1 F (36.7 C)] 96.8 F (36 C) (07/19 0951) Pulse Rate:  [48-109] 91 (07/19 0851) Resp:  [15-18] 18 (07/19 0742) BP:  (97-115)/(60-89) 115/78 (07/19 0851) SpO2:  [96 %-100 %] 96 % (07/19 0742) Last BM Date: 10/22/20  Weight change: Filed Weights   10/23/20 0451 10/24/20 0411 10/25/20 0453  Weight: 49.1 kg 46 kg 46.4 kg    Intake/Output:   Intake/Output Summary (Last 24 hours) at 10/26/2020 1033 Last data filed at 10/25/2020 1855 Gross per 24 hour  Intake 565.55 ml  Output --  Net 565.55 ml      Physical Exam    General:  Elderly female who appears chronically ill HEENT: Normocephalic, atraumatic  Neck: supple. JVP to jaw. Carotids 2+ bilat; no bruits. No lymphadenopathy or thyromegaly appreciated. Cor: PMI nondisplaced. Irregular rhythm with rates in the 70s to 80s. No rubs or gallops.  Lungs: Diminished in bases.  Sats in mid-90s on room air  Abdomen: soft, nontender, nondistended. No hepatosplenomegaly. No bruits or masses. Good bowel sounds. Extremities: no cyanosis, clubbing, rash, edema Neuro: alert & orientedx3, cranial nerves grossly intact. moves all 4 extremities w/o difficulty. Affect flat    Telemetry   Atrial fibrillation in 70s to 80s.  No RVR noted over the past 24 hours.  Personally reviewed   EKG     Labs   Basic Metabolic Panel: Recent Labs  Lab 10/20/20 0331 10/21/20 0129 10/22/20 0242 10/23/20 0805 10/24/20 0134 10/25/20 0126 10/26/20 0639  NA 129*   < > 129* 131* 128* 129* 134*  K 4.5   < > 4.4 4.8 4.3 4.9 4.5  CL 97*   < > 99 99 97* 98 100  CO2 20*   < > 21* 23 23 19* 21*  GLUCOSE 147*   < > 111* 128* 118* 106* 116*  BUN 24*   < > 41* 48* 49* 55* 58*  CREATININE 0.86   < > 1.16* 1.05* 0.98 0.97 1.07*  CALCIUM 8.6*   < > 8.0* 8.4* 8.1* 8.4* 8.4*  MG 2.0  --   --  2.2 2.2 2.4 2.6*  PHOS 3.8  --   --   --   --   --   --    < > = values in this interval not displayed.    Liver Function Tests: Recent  Labs  Lab 10/21/20 0129 10/22/20 0242 10/24/20 0134 10/25/20 0126 10/26/20 0639  AST 197* 358* 682* 557* 490*  ALT 133* 224* 472* 443* 439*   ALKPHOS 151* 155* 188* 210* 209*  BILITOT 1.5* 1.3* 1.3* 1.6* 1.7*  PROT 6.0* 5.8* 5.8* 5.9* 5.8*  ALBUMIN 2.4* 2.3* 2.2* 2.2* 2.2*   No results for input(s): LIPASE, AMYLASE in the last 168 hours. No results for input(s): AMMONIA in the last 168 hours.  CBC: Recent Labs  Lab 10/19/20 2115 10/20/20 0331 10/22/20 0242 10/23/20 0805 10/24/20 0134 10/25/20 0126 10/26/20 0639  WBC 14.8*   < > 14.6* 13.8* 13.1* 12.2* 12.8*  NEUTROABS 13.1*  --   --   --  11.5* 10.1* 10.8*  HGB 13.9   < > 13.3 13.7 13.4 14.0 13.8  HCT 41.5   < > 39.3 40.5 39.7 41.2 40.4  MCV 97.9   < > 96.1 97.1 96.4 96.7 96.2  PLT 558*   < > 535* 489* 470* 406* 398   < > = values in this interval not displayed.    Cardiac Enzymes: No results for input(s): CKTOTAL, CKMB, CKMBINDEX, TROPONINI in the last 168 hours.  BNP: BNP (last 3 results) Recent Labs    10/24/20 0134 10/25/20 0641 10/26/20 0639  BNP 1,340.8* 1,137.5* 990.2*    ProBNP (last 3 results) No results for input(s): PROBNP in the last 8760 hours.   CBG: Recent Labs  Lab 10/21/20 1541  GLUCAP 138*    Coagulation Studies: Recent Labs    10/24/20 1611  LABPROT 45.0*  INR 4.8*     Imaging   DG CHEST PORT 1 VIEW  Result Date: 10/26/2020 CLINICAL DATA:  Shortness of breath. EXAM: PORTABLE CHEST 1 VIEW COMPARISON:  10/25/2020.  CT 10/09/2016. FINDINGS: Stable cardiomegaly. Persistent right base atelectasis. Persistent bibasilar infiltrates/edema and bilateral pleural effusions without interim change. Biapical pleural thickening consistent scarring. No pneumothorax. Thoracic spine scoliosis and degenerative change again noted. IMPRESSION: 1.  Stable cardiomegaly. 2. Persistent right base atelectasis. Persistent bibasilar infiltrates/edema and small bilateral pleural effusions. Electronically Signed   By: Maisie Fus  Register   On: 10/26/2020 07:57   DG Chest Port 1 View  Result Date: 10/25/2020 CLINICAL DATA:  Shortness of breath EXAM:  PORTABLE CHEST 1 VIEW COMPARISON:  10/23/2020 FINDINGS: Cardiac shadow is within normal limits. Aortic calcifications are again seen. Increasing right-sided effusion and basilar opacity is seen. Stable left effusion is noted. No pneumothorax is seen. No bony abnormality is noted. IMPRESSION: Increasing right-sided pleural effusion with underlying basilar opacity. Stable small left effusion. Electronically Signed   By: Alcide Clever M.D.   On: 10/25/2020 11:59   ECHO TEE  Result Date: 10/25/2020    TRANSESOPHOGEAL ECHO REPORT   Patient Name:   [facility] [name] Date of Exam: 10/25/2020 Medical Rec #:  [id number]                   Height:       56.0 in Accession #:    [id number]                  Weight:       102.3 lb Date of Birth:  09/17/1945                   BSA:          1.335 m Patient Age:    89 years  BP:           109/73 mmHg Patient Gender: F                           HR:           96 bpm. Exam Location:  Inpatient Procedure: 2D Echo, Cardiac Doppler and Color Doppler Indications:     Atrial fibrillation  History:         Patient has prior history of Echocardiogram examinations. CAD,                  COPD and Pulmonary HTN, Arrythmias:Atrial Fibrillation; Risk                  Factors:Dyslipidemia and Current Smoker.  Sonographer:     Ross Ludwig RDCS (AE) Referring Phys:  [id number] Roe Rutherford [facility] Diagnosing Phys: Armanda Magic MD PROCEDURE: After discussion of the risks and benefits of a TEE, an informed consent was obtained from the patient. The transesophogeal probe was passed without difficulty through the esophogus of the patient. Sedation performed by different physician. The patient was monitored while under deep sedation. Anesthestetic sedation was provided intravenously by Anesthesiology: 63.8mg  of Propofol. Image quality was adequate. The patient's vital signs; including heart rate, blood pressure, and oxygen saturation; remained stable throughout the procedure. The  patient developed no complications during the procedure. IMPRESSIONS  1. Left ventricular ejection fraction, by estimation, is 20 to 25%. The left ventricle has severely decreased function. The left ventricle demonstrates global hypokinesis with severe spontaneous echo contrast.  2. Right ventricular systolic function is moderately reduced. The right ventricular size is normal.  3. Left atrial size was severely dilated. There was severe spontaneous echo contrast in both the LA and the LA appnedage consistent with very sluggish flow and increased risk of thrombus formation. There is suspicion for left atrial appendage thrombus in the mouth of the appendage.  4. Right atrial size was Massively dilated with severe spontaneos echo contrast.  5. The mitral valve is normal in structure. Trivial mitral valve regurgitation. No evidence of mitral stenosis.  6. Tricuspid valve regurgitation is severe related to TV annular dilatation.  7. The aortic valve is normal in structure. Aortic valve regurgitation is trivial. No aortic stenosis is present. Conclusion(s)/Recommendation(s):Severe LV dysfunction, moderate RV dysfunction, severe LAE with severe Spontaneous echo contrast and possible LAA thrombus, massive RAE and severe TR FINDINGS  Left Ventricle: Left ventricular ejection fraction, by estimation, is 20 to 25%. The left ventricle has severely decreased function. The left ventricle demonstrates global hypokinesis. The left ventricular internal cavity size was normal in size. There is no left ventricular hypertrophy. Right Ventricle: The right ventricular size is normal. No increase in right ventricular wall thickness. Right ventricular systolic function is moderately reduced. Left Atrium: Left atrial size was severely dilated. Spontaneous echo contrast was present in the left atrium and left atrial appendage. A left atrial/left atrial appendage thrombus was detected. Right Atrium: Right atrial size was Massively dilated  with severe spontaneos echo contrast. Pericardium: There is no evidence of pericardial effusion. Mitral Valve: The mitral valve is normal in structure. Trivial mitral valve regurgitation. No evidence of mitral valve stenosis. Tricuspid Valve: The tricuspid valve is normal in structure. Tricuspid valve regurgitation is severe. No evidence of tricuspid stenosis. Aortic Valve: The aortic valve is normal in structure. Aortic valve regurgitation is trivial. No aortic stenosis is present. Pulmonic Valve: The pulmonic valve was  normal in structure. Pulmonic valve regurgitation is trivial. No evidence of pulmonic stenosis. Aorta: The aortic root is normal in size and structure. Venous: The left upper pulmonary vein is normal. A normal flow pattern is recorded from the left upper pulmonary vein. IAS/Shunts: No atrial level shunt detected by color flow Doppler. Armanda Magic MD Electronically signed by Armanda Magic MD Signature Date/Time: 10/25/2020/3:19:50 PM    Final    [place] EKG SITE RITE  Result Date: 10/26/2020 If Site Rite image not attached, placement could not be confirmed due to current cardiac rhythm.    Medications:     Current Medications:  citalopram  20 mg Oral QHS   digoxin  0.0625 mg Oral Daily   feeding supplement  237 mL Oral TID BM   furosemide  40 mg Intravenous BID   multivitamin with minerals  1 tablet Oral Daily   pantoprazole  40 mg Oral Daily    Infusions:  ampicillin-sulbactam (UNASYN) IV 3 g (10/26/20 0851)   heparin 600 Units/hr (10/25/20 2056)      Patient Profile   The patient is a 75 y/o Caucasian female with a medical history significant for chronic diastolic heart failure, atrial fibrillation (newly diagnosed in June 2022 and started on Eliquis, digoxin and metoprolol), hypertension and COPD who presented to [facility] on 10/19/2020 for evaluation of dyspnea, cough and weakness.  A TEE on 7/18 revealed a new cardiomyopathy.   Assessment/Plan   Acute HFrEF Nonischemic  cardiomyopathy - Echo on 7/18 revealed an EF of 25 to 30 percent, decreased from 55 to 60 percent on 10/06/2020 - Recent left heart catheterization on 10/07/2020 revealed mild 40% segmental RCA stenosis as well as 50 to 60% second diagonal branch stenosis  - Suspect etiology of newly reduced EF is atrial fibrillation - DCCV planned for 7/18 deferred due to concern for LAA thrombus - Awaiting PICC placement to evaluate co-oximetry and CVPs to guide need for diuresis.  Avoiding for now since she is not grossly volume overloaded on exam and renal function slightly worse (creatinine from 0.97 to 1.07) - Pending clinical course, consider spironolactone and SGLT2 inhibitor  - Beta blocker on hold due to concern for shock   Atrial fibrillation  - Rates in 70s to 80s - Diltiazem started earlier this admission, but stopped due to finding of newly reduced EF. Initially on metoprolol for rate control, but stopped due to concern for shock  - Planned DCCV deferred due to concern for LAA thrombus - Will start amiodarone. LFTs improving and suspect elevation is r/t congestive hepatopathy.  Will continue to monitor - Digoxin stopped due to supratherapeutic level of 1.4  - Currently on heparin drip due to LAA thrombus and cholecystitis workup; however, no planned surgical intervention due to negative HIDA scan.  Bridge to Eliquis   Cardiogenic shock - TEE on 7/18 with considerable "smoke" indicating low output with EF of 25 to 30 percent - Will start milrinone at 0.25. Follow with NE and titrate to maintain SBP of >100 if needed - IV Lasix 40 mg x1 in one hour after starting milrinone   - CVP of 12-13 via PICC  - Monitor co-oximetry   Left atrial appendage thrombus - Noted on TEE on 7/18 - Continue heparin drip with eventual transition to Eliquis  CAD - LHC as noted above: 40% segmental mid RCA stenosis as well as 50 to 60% segmental second diagonal branch stenosis all of which can be treated medically -  Beta blocker on hold  due to shock.  Statin on hold due to transaminitis  Transaminitis - Surgery and GI consulted -- both believe etiology is congestive hepatopathy due to heart failure  - LFTs improving  - HIDA negative for cholecystitis - Started on Unasyn for cholecystitis; however, HIDA negative and WBC trending down. Will switch to ceftriaxone   Pulmonary hypertension - Echo shows severely dilated atria - Optimize from volume standpoint  - Can address medically once cardiogenic shock is stabilized   COPD - Not currently exacerbated - Flutter valve and incentive spirometry ordered   Right pleural effusion - s/p US-guided thoracentesis on 7/13  - CXR on 7/16 shows some reaccumulation  - Monitor closely  - Currently denies SOB and is not hypoxic   Length of Stay: 7  Peter Garter, NP  10/26/2020, 10:33 AM  Advanced Heart Failure Team Pager [phone] (M-F; 7a - 5p)  Please contact [facility] for night-coverage after hours (4p -7a ) and weekends on [email or web address]   Agree with above.  75 y/o woman with severe COPD, blindness. No known cardiac history. Poor baseline functional capacity over the past 2 years.   Admitted 6/22 with new onset AF. EF normal RV dilated. Cath with nonobstructive CAD. Sent home with [facility] and rate control.   Now readmitted with acute HF.  TEE EF 20% RV moderately down RA massively dilated. Heavy smoke in RA/LA and aortic root.   Developed shock with elevated LFTs. Moved to ICU.   PICC placed. Co-ox 38% CVP 12  General:  Elderly Blind HEENT: normal edentulous Neck: supple. JVP to jaw Carotids 2+ bilat; no bruits. No lymphadenopathy or thryomegaly appreciated. Cor: PMI nondisplaced. Irr IRR Lungs: clear Abdomen: soft, nontender, nondistended. No hepatosplenomegaly. No bruits or masses. Good bowel sounds. Extremities: no cyanosis, clubbing, rash, tr edema cool  Neuro: alert & orientedx3, cranial nerves grossly intact. moves all 4 extremities w/o  difficulty. Affect pleasant  She has cardiogenic shock due to tachy cardiomyopathy. Suspect underlying PAH from COPD given RV/RA appearance. Start milrinone +/- NE as needed for BP support. Diurese gently.Start IV amio. Continue heparin. Trend co-ox.   CRITICAL CARE Performed by: Arvilla Meres  Total critical care time: 45 minutes  Critical care time was exclusive of separately billable procedures and treating other patients.  Critical care was necessary to treat or prevent imminent or life-threatening deterioration.  Critical care was time spent personally by me (independent of midlevel providers or residents) on the following activities: development of treatment plan with patient and/or surrogate as well as nursing, discussions with consultants, evaluation of patient's response to treatment, examination of patient, obtaining history from patient or surrogate, ordering and performing treatments and interventions, ordering and review of laboratory studies, ordering and review of radiographic studies, pulse oximetry and re-evaluation of patient's condition.  Arvilla Meres, MD  3:12 PM

## 2020-10-26 NOTE — Evaluation (Signed)
Clinical/Bedside Swallow Evaluation Patient Details  Name: Laurie James MRN: [id number] Date of Birth: 1945-09-23  Today's Date: 10/26/2020 Time: SLP Start Time (ACUTE ONLY): 0902 SLP Stop Time (ACUTE ONLY): 0921 SLP Time Calculation (min) (ACUTE ONLY): 19 min  Past Medical History:  Past Medical History:  Diagnosis Date   AMD (age related macular degeneration)    COPD (chronic obstructive pulmonary disease) (HCC)    Depression    Hiatal hernia    Intermittent low back pain    Memory impairment    Midsternal chest pain    a. 12/2011 Cardiac CTA Ca++ score of 103.3 (80th %), LAD <50p/m, RCA 50-75.   Osteoporosis    Vitamin B 12 deficiency    Past Surgical History:  Past Surgical History:  Procedure Laterality Date   ABDOMINAL HYSTERECTOMY     IR THORACENTESIS ASP PLEURAL SPACE W/IMG GUIDE  10/20/2020   RIGHT/LEFT HEART CATH AND CORONARY ANGIOGRAPHY N/A 10/07/2020   Procedure: RIGHT/LEFT HEART CATH AND CORONARY ANGIOGRAPHY;  Surgeon: Runell Gess, MD;  Location: [facility] INVASIVE CV LAB;  Service: Cardiovascular;  Laterality: N/A;   TEE WITHOUT CARDIOVERSION N/A 10/25/2020   Procedure: TRANSESOPHAGEAL ECHOCARDIOGRAM (TEE);  Surgeon: Quintella Reichert, MD;  Location: [facility] ENDOSCOPY;  Service: Cardiovascular;  Laterality: N/A;   HPI:  75 y.o. F admitted to [facility] due to worsening SOB and increased weakness. Work-up reveals pneumonia and bilateral pleural effusions, right greater than left. Her prior medical history is significant for newly diagnosed A. fib, COPD, hypertension, hyperlipidemia, chronic anxiety/depression, GERD, HH, memory impairment.   Assessment / Plan / Recommendation Clinical Impression  Pt does not have her dentures present and therefore only attempts soft textures, but with adequate appearing oral function. At baseline her mouth is dry and her tongue is also coated. Baseline throat clearing is also noted intermittently across most consistencies, particularly  with colder temperatures. She has no overt coughing even when challenged with large, sequential straw sips of thin liquids and she denies having had PNA in the past. Recommend that she continue with regular solids and thin liquids, allowing her to pick food items from the menu that may be soft enough for her to masticate without her dentures. In light of current concern for PNA, SLP will f/u at least briefly. SLP Visit Diagnosis: Dysphagia, unspecified (R13.10)    Aspiration Risk  Mild aspiration risk    Diet Recommendation Regular;Thin liquid   Liquid Administration via: Cup;Straw Medication Administration: Whole meds with liquid Supervision: Staff to assist with self feeding Compensations: Slow rate;Small sips/bites Postural Changes: Seated upright at 90 degrees;Remain upright for at least 30 minutes after po intake    Other  Recommendations Oral Care Recommendations: Oral care BID   Follow up Recommendations None      Frequency and Duration min 1 x/week  1 week       Prognosis Prognosis for Safe Diet Advancement: Good      Swallow Study   General HPI: 75 y.o. F admitted to [facility] due to worsening SOB and increased weakness. Work-up reveals pneumonia and bilateral pleural effusions, right greater than left. Her prior medical history is significant for newly diagnosed A. fib, COPD, hypertension, hyperlipidemia, chronic anxiety/depression, GERD, HH, memory impairment. Type of Study: Bedside Swallow Evaluation Previous Swallow Assessment: none in chart Diet Prior to this Study: Regular;Thin liquids Temperature Spikes Noted: No Respiratory Status: Room air History of Recent Intubation: No Behavior/Cognition: Alert;Cooperative;Pleasant mood Oral Cavity Assessment: Dry;Other (comment) (coating on tongue) Oral Care Completed  by SLP: No Oral Cavity - Dentition: Edentulous;Dentures, not available (she says dentures are at home - she needs to eat soft foods without them) Vision: Impaired  for self-feeding Self-Feeding Abilities: Needs assist Patient Positioning: Upright in chair Baseline Vocal Quality: Normal Volitional Cough: Strong;Congested Volitional Swallow: Able to elicit    Oral/Motor/Sensory Function Overall Oral Motor/Sensory Function: Within functional limits   Ice Chips Ice chips: Not tested   Thin Liquid Thin Liquid: Impaired Presentation: Cup;Self Fed;Straw Pharyngeal  Phase Impairments: Throat Clearing - Immediate;Throat Clearing - Delayed    Nectar Thick Nectar Thick Liquid: Within functional limits Presentation: Self Fed;Straw   Honey Thick Honey Thick Liquid: Not tested   Puree Puree: Impaired Presentation: Spoon Pharyngeal Phase Impairments: Throat Clearing - Immediate;Throat Clearing - Delayed   Solid     Solid: Within functional limits (but only tried soft solids as she doesn't have her dentures)      Laurie James., M.A. CCC-SLP Acute Rehabilitation Services Pager [phone] Office [phone]  10/26/2020,9:29 AM

## 2020-10-26 NOTE — Progress Notes (Signed)
Peripherally Inserted Central Catheter Placement  The IV Nurse has discussed with the patient and/or persons authorized to consent for the patient, the purpose of this procedure and the potential benefits and risks involved with this procedure.  The benefits include less needle sticks, lab draws from the catheter, and the patient may be discharged home with the catheter. Risks include, but not limited to, infection, bleeding, blood clot (thrombus formation), and puncture of an artery; nerve damage and irregular heartbeat and possibility to perform a PICC exchange if needed/ordered by physician.  Alternatives to this procedure were also discussed.  Bard Power PICC patient education guide, fact sheet on infection prevention and patient information card has been provided to patient /or left at bedside.  Verbal consent given at patient preference due to vision issues   PICC Placement Documentation  PICC Double Lumen 10/26/20 PICC Right Basilic 35 cm 0 cm (Active)  Indication for Insertion or Continuance of Line Chronic illness with exacerbations (CF, Sickle Cell, etc.) 10/26/20 1223  Exposed Catheter (cm) 0 cm 10/26/20 1223  Site Assessment Clean;Dry;Intact 10/26/20 1223  Lumen #1 Status Flushed;Saline locked;Blood return noted 10/26/20 1223  Lumen #2 Status Flushed;Saline locked;Blood return noted 10/26/20 1223  Dressing Type Transparent 10/26/20 1223  Dressing Status Clean;Dry;Intact 10/26/20 1223  Antimicrobial disc in place? Yes 10/26/20 1223  Safety Lock Not Applicable 10/26/20 1223  Line Care Connections checked and tightened 10/26/20 1223  Line Adjustment (NICU/IV Team Only) No 10/26/20 1223  Dressing Intervention New dressing 10/26/20 1223  Dressing Change Due 11/02/20 10/26/20 1223       Arrielle Mcginn, Lajean Manes 10/26/2020, 12:24 PM

## 2020-10-27 ENCOUNTER — Other Ambulatory Visit: Payer: Self-pay | Admitting: Cardiology

## 2020-10-27 DIAGNOSIS — R931 Abnormal findings on diagnostic imaging of heart and coronary circulation: Secondary | ICD-10-CM

## 2020-10-27 DIAGNOSIS — I5033 Acute on chronic diastolic (congestive) heart failure: Secondary | ICD-10-CM | POA: Diagnosis not present

## 2020-10-27 DIAGNOSIS — R570 Cardiogenic shock: Secondary | ICD-10-CM | POA: Diagnosis not present

## 2020-10-27 LAB — APTT
aPTT: 57 seconds — ABNORMAL HIGH (ref 24–36)
aPTT: 94 seconds — ABNORMAL HIGH (ref 24–36)

## 2020-10-27 LAB — COOXEMETRY PANEL
Carboxyhemoglobin: 1.1 % (ref 0.5–1.5)
Methemoglobin: 1.1 % (ref 0.0–1.5)
O2 Saturation: 56.9 %
Total hemoglobin: 12.6 g/dL (ref 12.0–16.0)

## 2020-10-27 LAB — CBC WITH DIFFERENTIAL/PLATELET
Abs Immature Granulocytes: 0.06 10*3/uL (ref 0.00–0.07)
Basophils Absolute: 0 10*3/uL (ref 0.0–0.1)
Basophils Relative: 0 %
Eosinophils Absolute: 0.3 10*3/uL (ref 0.0–0.5)
Eosinophils Relative: 2 %
HCT: 36.2 % (ref 36.0–46.0)
Hemoglobin: 12.1 g/dL (ref 12.0–15.0)
Immature Granulocytes: 1 %
Lymphocytes Relative: 5 %
Lymphs Abs: 0.6 10*3/uL — ABNORMAL LOW (ref 0.7–4.0)
MCH: 32.1 pg (ref 26.0–34.0)
MCHC: 33.4 g/dL (ref 30.0–36.0)
MCV: 96 fL (ref 80.0–100.0)
Monocytes Absolute: 1 10*3/uL (ref 0.1–1.0)
Monocytes Relative: 8 %
Neutro Abs: 10.5 10*3/uL — ABNORMAL HIGH (ref 1.7–7.7)
Neutrophils Relative %: 84 %
Platelets: 348 10*3/uL (ref 150–400)
RBC: 3.77 MIL/uL — ABNORMAL LOW (ref 3.87–5.11)
RDW: 15.6 % — ABNORMAL HIGH (ref 11.5–15.5)
WBC: 12.4 10*3/uL — ABNORMAL HIGH (ref 4.0–10.5)
nRBC: 0 % (ref 0.0–0.2)

## 2020-10-27 LAB — COMPREHENSIVE METABOLIC PANEL
ALT: 376 U/L — ABNORMAL HIGH (ref 0–44)
AST: 387 U/L — ABNORMAL HIGH (ref 15–41)
Albumin: 2.1 g/dL — ABNORMAL LOW (ref 3.5–5.0)
Alkaline Phosphatase: 247 U/L — ABNORMAL HIGH (ref 38–126)
Anion gap: 12 (ref 5–15)
BUN: 50 mg/dL — ABNORMAL HIGH (ref 8–23)
CO2: 26 mmol/L (ref 22–32)
Calcium: 7.9 mg/dL — ABNORMAL LOW (ref 8.9–10.3)
Chloride: 93 mmol/L — ABNORMAL LOW (ref 98–111)
Creatinine, Ser: 1.08 mg/dL — ABNORMAL HIGH (ref 0.44–1.00)
GFR, Estimated: 54 mL/min — ABNORMAL LOW (ref >60)
Glucose, Bld: 254 mg/dL — ABNORMAL HIGH (ref 70–99)
Potassium: 3.2 mmol/L — ABNORMAL LOW (ref 3.5–5.1)
Sodium: 131 mmol/L — ABNORMAL LOW (ref 135–145)
Total Bilirubin: 1.2 mg/dL (ref 0.3–1.2)
Total Protein: 5.6 g/dL — ABNORMAL LOW (ref 6.5–8.1)

## 2020-10-27 LAB — MAGNESIUM: Magnesium: 2.2 mg/dL (ref 1.7–2.4)

## 2020-10-27 LAB — HEPARIN LEVEL (UNFRACTIONATED): Heparin Unfractionated: >1.1 IU/mL — ABNORMAL HIGH (ref 0.30–0.70)

## 2020-10-27 LAB — BRAIN NATRIURETIC PEPTIDE: B Natriuretic Peptide: 1027.1 pg/mL — ABNORMAL HIGH (ref 0.0–100.0)

## 2020-10-27 MED ORDER — FUROSEMIDE 10 MG/ML IJ SOLN
40.0000 mg | Freq: Once | INTRAMUSCULAR | Status: AC
  Administered 2020-10-27: 40 mg via INTRAVENOUS
  Filled 2020-10-27: qty 4

## 2020-10-27 MED ORDER — POTASSIUM CHLORIDE CRYS ER 20 MEQ PO TBCR
40.0000 meq | EXTENDED_RELEASE_TABLET | Freq: Once | ORAL | Status: AC
  Administered 2020-10-27: 40 meq via ORAL
  Filled 2020-10-27: qty 2

## 2020-10-27 MED ORDER — AMIODARONE LOAD VIA INFUSION
150.0000 mg | Freq: Once | INTRAVENOUS | Status: AC
  Administered 2020-10-27: 150 mg via INTRAVENOUS
  Filled 2020-10-27: qty 83.34

## 2020-10-27 NOTE — Progress Notes (Signed)
Physical Therapy Treatment Patient Details Name: Laurie James MRN: [id number] DOB: 10/05/1945 Today's Date: 10/27/2020    History of Present Illness 75 y.o. F admitted to [facility] due to worsening SOB and increased weakness. Work-up reveals pneumonia and bilateral pleural effusions, right greater than left. Her prior medical history is significant for newly diagnosed A. fib, COPD, hypertension, hyperlipidemia, chronic anxiety/depression, GERD.    PT Comments    Pt initially appeared fatigued, but became alert and eager to participate.  Emphasis on transition to EOB, sit to stands and progression of gait stability and stamina using EVA walker today.    Follow Up Recommendations  Home health PT;Supervision for mobility/OOB (pt refusing SNF)     Equipment Recommendations  Other (comment)    Recommendations for Other Services       Precautions / Restrictions Precautions Precautions: Fall Precaution Comments: tachycardia    Mobility  Bed Mobility Overal bed mobility: Needs Assistance Bed Mobility: Supine to Sit           General bed mobility comments: increased time, use of therapist hand as rail    Transfers Overall transfer level: Needs assistance Equipment used:  (EVA) Transfers: Sit to/from Stand Sit to Stand: Min guard;Min assist         General transfer comment: cues for hand placement and stability assist  Ambulation/Gait Ambulation/Gait assistance: Min assist Gait Distance (Feet): 50 Feet Assistive device:  (EVA walker) Gait Pattern/deviations: Step-through pattern Gait velocity: decr Gait velocity interpretation: <1.8 ft/sec, indicate of risk for recurrent falls General Gait Details: flexed posture, heavy on the EVA, wider BOS, low amplitude steps.   Stairs             Wheelchair Mobility    Modified Rankin (Stroke Patients Only)       Balance Overall balance assessment: Needs assistance Sitting-balance support: No upper  extremity supported;Feet supported Sitting balance-Leahy Scale: Good     Standing balance support: Bilateral upper extremity supported;Single extremity supported Standing balance-Leahy Scale: Poor Standing balance comment: reliant on AD or external support                            Cognition Arousal/Alertness: Awake/alert Behavior During Therapy: WFL for tasks assessed/performed Overall Cognitive Status: Within Functional Limits for tasks assessed                                        Exercises      General Comments General comments (skin integrity, edema, etc.): HR 120's at rest / afib,  137 bpm with activity, spiking as high as 153 bpm.  Sats mid 90's or better.      Pertinent Vitals/Pain Pain Assessment: Faces Faces Pain Scale: No hurt Pain Intervention(s): Monitored during session    Home Living                      Prior Function            PT Goals (current goals can now be found in the care plan section) Acute Rehab PT Goals Patient Stated Goal: to improve strength and activity tolerance PT Goal Formulation: With patient/family Time For Goal Achievement: 11/05/20 Potential to Achieve Goals: Good Progress towards PT goals: Progressing toward goals    Frequency    Min 3X/week      PT Plan Current plan  remains appropriate    Co-evaluation              AM-PAC PT "6 Clicks" Mobility   Outcome Measure  Help needed turning from your back to your side while in a flat bed without using bedrails?: A Little Help needed moving from lying on your back to sitting on the side of a flat bed without using bedrails?: A Little Help needed moving to and from a bed to a chair (including a wheelchair)?: A Little Help needed standing up from a chair using your arms (e.g., wheelchair or bedside chair)?: A Little Help needed to walk in hospital room?: A Little Help needed climbing 3-5 steps with a railing? : A Lot 6 Click Score:  17    End of Session   Activity Tolerance: Patient tolerated treatment well;Patient limited by fatigue Patient left: in chair;with call bell/phone within reach;with chair alarm set Nurse Communication: Mobility status PT Visit Diagnosis: Unsteadiness on feet (R26.81);Muscle weakness (generalized) (M62.81)     Time: 2751-7001 PT Time Calculation (min) (ACUTE ONLY): 21 min  Charges:  $Gait Training: 8-22 mins                     10/27/2020  Jacinto Halim., PT Acute Rehabilitation Services [phone]  (pager) [phone]  (office)   Eliseo Gum Shadana Pry 10/27/2020, 4:18 PM

## 2020-10-27 NOTE — Progress Notes (Signed)
ANTICOAGULATION CONSULT NOTE  Pharmacy Consult for IV Heparin Indication: atrial fibrillation and suspected LAA thrombus 7/18  Allergies  Allergen Reactions   Codeine Nausea And Vomiting    Patient Measurements: Height: 4\' 8"  (142.2 cm) Weight: 50.8 kg (111 lb 15.9 oz) IBW/kg (Calculated) : 36.3 Heparin Dosing Weight: 46 kg  Vital Signs: Temp: 97.4 F (36.3 C) (07/20 0300) Temp Source: Oral (07/20 0300) BP: 106/72 (07/20 0500) Pulse Rate: 119 (07/20 0500)  Labs: Recent Labs    10/24/20 1611 10/24/20 1611 10/25/20 0126 10/25/20 1514 10/25/20 1813 10/26/20 0639 10/27/20 0455  HGB  --    < > 14.0  --   --  13.8 12.1  HCT  --   --  41.2  --   --  40.4 36.2  PLT  --   --  406*  --   --  398 348  APTT 52*  --  174*   < > 118* 73* 57*  LABPROT 45.0*  --   --   --   --   --   --   INR 4.8*  --   --   --   --   --   --   HEPARINUNFRC >1.10*  --  >1.10*   < > >1.10* >1.10* >1.10*  CREATININE  --   --  0.97  --   --  1.07*  --    < > = values in this interval not displayed.   Estimated Creatinine Clearance: 30.2 mL/min (A) (by C-G formula based on SCr of 1.07 mg/dL (H)).   Medical History: Past Medical History:  Diagnosis Date   AMD (age related macular degeneration)    COPD (chronic obstructive pulmonary disease) (HCC)    Depression    Hiatal hernia    Intermittent low back pain    Memory impairment    Midsternal chest pain    a. 12/2011 Cardiac CTA Ca++ score of 103.3 (80th %), LAD <50p/m, RCA 50-75.   Osteoporosis    Vitamin B 12 deficiency     Assessment: 75 yr old female on apixaban PTA for atrial fibrillation (last dose 7/16 2100); apixaban on hold for elevated LFTs. DCCV not done due to possible LA appendage thrombus 7/18. Pharmacy was consulted to start IV heparin. Given recent apixaban exposure, will monitor anticoagulation using aPTT until aPTT and heparin levels correlate.  APTT down to subtherapeutic (57 sec) on 600 units/hr. Heparin level remains  >  1.1 (due to Eliquis). No issues with line or bleeding reported per RN.   Goal of Therapy:  Heparin level 0.3-0.7 units/ml aPTT 66-102 seconds Monitor platelets by anticoagulation protocol: Yes   Plan:  Increase heparin infusion to 650 units/hr F/u 8 hr aPTT  8/18, PharmD, BCPS Please see amion for complete clinical pharmacist phone list 10/27/2020 6:27 AM

## 2020-10-27 NOTE — Progress Notes (Signed)
[facility] Gastroenterology Progress Note  [facility] 75 y.o. [birth year]  CC:   Abn LFts   Subjective: Patient seen and examined at bedside.  No acute GI issues.  ROS : Negative for abdominal pain, nausea or vomiting.  Negative for diarrhea and constipation.  Negative for bleeding.    Objective: Vital signs in last 24 hours: Vitals:   10/27/20 0700 10/27/20 0800  BP:  116/74  Pulse:  (!) 122  Resp:  19  Temp: (!) 96.9 F (36.1 C)   SpO2:  97%    Physical Exam:  General : Elderly appearing patient, not in acute distress LUNGS: No visible respiratory distress Abdomen : Soft, nontender, nondistended, bowel sounds present.  No peritoneal signs Psych : Mood and affect normal   Lab Results: Recent Labs    10/25/20 0126 10/26/20 0639  NA 129* 134*  K 4.9 4.5  CL 98 100  CO2 19* 21*  GLUCOSE 106* 116*  BUN 55* 58*  CREATININE 0.97 1.07*  CALCIUM 8.4* 8.4*  MG 2.4 2.6*   Recent Labs    10/25/20 0126 10/26/20 0639  AST 557* 490*  ALT 443* 439*  ALKPHOS 210* 209*  BILITOT 1.6* 1.7*  PROT 5.9* 5.8*  ALBUMIN 2.2* 2.2*   Recent Labs    10/26/20 0639 10/27/20 0455  WBC 12.8* 12.4*  NEUTROABS 10.8* 10.5*  HGB 13.8 12.1  HCT 40.4 36.2  MCV 96.2 96.0  PLT 398 348   Recent Labs    10/24/20 1611  LABPROT 45.0*  INR 4.8*      Assessment/Plan: -Abnormal LFTs.  Most likely congestive hepatopathy.  LFTs stable now. -Hypoxic respiratory failure. -CHF with EF of 25% -Paroxysmal atrial fibrillation with possible left atrial appendage thrombus.  On heparin drip  Recommendations ------------------------------ -LFTs pending this morning. -Continue supportive care -GI will follow periodically.  Discussed with family at bedside.   Kathi Der MD, FACP 10/27/2020, 9:30 AM  Contact #  [phone]

## 2020-10-27 NOTE — Progress Notes (Signed)
Advanced Heart Failure Rounding Note  PCP-Cardiologist: Charlton Haws, MD   Subjective:    She is sitting up in bed in no acute distress.  She denies chest pain, shortness of breath and endorses a better appetite.   Remains on milrinone at 0.25 and amiodarone at 30.  Norepinephrine at 2 -- started yesterday afternoon around 1 pm.  MAPs in 80s-90s.  Co-ox improved from 38 -> 50 -> 56 this morning and lactate decreased to 1.7.  Received 40 mg of IV Lasix yesterday with 1500 of recorded urine output.  CVP 7-8 this morning.  Awaiting BMET.  Afib persists with rates in the 100s to 110s, although she was briefly rate-controlled in the 90s overnight.     Objective:   Weight Range: 50.8 kg Body mass index is 25.11 kg/m.   Vital Signs:   Temp:  [96.7 F (35.9 C)-98.5 F (36.9 C)] 98.5 F (36.9 C) (07/20 0700) Pulse Rate:  [84-127] 106 (07/20 0615) Resp:  [13-60] 30 (07/20 0615) BP: (92-123)/(53-84) 123/84 (07/20 0615) SpO2:  [91 %-98 %] 95 % (07/20 0615) Weight:  [50.8 kg] 50.8 kg (07/20 0500) Last BM Date: 10/22/20  Weight change: Filed Weights   10/24/20 0411 10/25/20 0453 10/27/20 0500  Weight: 46 kg 46.4 kg 50.8 kg    Intake/Output:   Intake/Output Summary (Last 24 hours) at 10/27/2020 0815 Last data filed at 10/27/2020 0640 Gross per 24 hour  Intake 1567.64 ml  Output 1500 ml  Net 67.64 ml      Physical Exam    General:  Elderly female in no acute distress.  Awake and alert.   HEENT: Normocephalic, atraumatic.  Legally blind and hard of hearing Neck: Supple. JVP to jaw. Carotids 2+ bilat; no bruits. No lymphadenopathy or thyromegaly appreciated. Cor: PMI nondisplaced. Irregularly irregular rhythm with rates in 100s to 110s. No rubs or gallops. Lungs: Clear, but diminished  Abdomen: Soft, nontender, nondistended. No hepatosplenomegaly. No bruits or masses. Good bowel sounds. Extremities: No cyanosis, clubbing or rash.  Trace edema to BLE  Neuro: Alert &  orientedx3, cranial nerves grossly intact. moves all 4 extremities w/o difficulty. Affect pleasant   Telemetry   Afib in 100s to 110s.  Personally reviewed   EKG      Labs    CBC Recent Labs    10/26/20 0639 10/27/20 0455  WBC 12.8* 12.4*  NEUTROABS 10.8* 10.5*  HGB 13.8 12.1  HCT 40.4 36.2  MCV 96.2 96.0  PLT 398 348   Basic Metabolic Panel Recent Labs    16/10/96 0126 10/26/20 0639  NA 129* 134*  K 4.9 4.5  CL 98 100  CO2 19* 21*  GLUCOSE 106* 116*  BUN 55* 58*  CREATININE 0.97 1.07*  CALCIUM 8.4* 8.4*  MG 2.4 2.6*   Liver Function Tests Recent Labs    10/25/20 0126 10/26/20 0639  AST 557* 490*  ALT 443* 439*  ALKPHOS 210* 209*  BILITOT 1.6* 1.7*  PROT 5.9* 5.8*  ALBUMIN 2.2* 2.2*   No results for input(s): LIPASE, AMYLASE in the last 72 hours. Cardiac Enzymes No results for input(s): CKTOTAL, CKMB, CKMBINDEX, TROPONINI in the last 72 hours.  BNP: BNP (last 3 results) Recent Labs    10/24/20 0134 10/25/20 0641 10/26/20 0639  BNP 1,340.8* 1,137.5* 990.2*    ProBNP (last 3 results) No results for input(s): PROBNP in the last 8760 hours.   D-Dimer No results for input(s): DDIMER in the last 72 hours. Hemoglobin A1C No  results for input(s): HGBA1C in the last 72 hours. Fasting Lipid Panel No results for input(s): CHOL, HDL, LDLCALC, TRIG, CHOLHDL, LDLDIRECT in the last 72 hours. Thyroid Function Tests Recent Labs    10/24/20 1124  TSH 3.795    Other results:   Imaging    DG CHEST PORT 1 VIEW  Result Date: 10/26/2020 CLINICAL DATA:  S/P PICC central line placement EXAM: PORTABLE CHEST - 1 VIEW COMPARISON:  10/26/2020 FINDINGS: Right arm PICC line extends to mid SVC. Moderate right and small left pleural effusions, with adjacent consolidation/atelectasis in the lung bases as before. Aortic Atherosclerosis (ICD10-170.0). Mild thoracic levoscoliosis. No pneumothorax. IMPRESSION: 1. PICC line to the mid SVC. 2. Stable pleural  effusions with adjacent consolidation/atelectasis, right worse than left. Electronically Signed   By: Corlis Leak M.D.   On: 10/26/2020 13:04   [place] EKG SITE RITE  Result Date: 10/26/2020 If Site Rite image not attached, placement could not be confirmed due to current cardiac rhythm.    Medications:     Scheduled Medications:  Chlorhexidine Gluconate Cloth  6 each Topical Daily   citalopram  20 mg Oral QHS   feeding supplement  237 mL Oral TID BM   multivitamin with minerals  1 tablet Oral Daily   nystatin  5 mL Oral QID   pantoprazole  40 mg Oral Daily   sodium chloride flush  10-40 mL Intracatheter Q12H    Infusions:  amiodarone 30 mg/hr (10/27/20 0640)   cefTRIAXone (ROCEPHIN)  IV Stopped (10/26/20 1540)   heparin 650 Units/hr (10/27/20 0640)   milrinone 0.25 mcg/kg/min (10/27/20 0640)   norepinephrine (LEVOPHED) Adult infusion 2 mcg/min (10/27/20 0640)    PRN Medications: lidocaine (PF), melatonin, metoprolol tartrate, ondansetron (ZOFRAN) IV, polyethylene glycol    Patient Profile    The patient is a 75 y/o Caucasian female with a medical history significant for chronic diastolic heart failure, atrial fibrillation (newly diagnosed in June 2022 and started on Eliquis, digoxin and metoprolol), hypertension and COPD who presented to [facility] on 10/19/2020 for evaluation of dyspnea, cough and weakness.  A TEE on 7/18 revealed a new cardiomyopathy  Assessment/Plan    Acute HFrEF Nonischemic cardiomyopathy - Echo on 7/18 revealed an EF of 25 to 30 percent, decreased from 55 to 60 percent on 10/06/2020 - Recent left heart catheterization on 10/07/2020 revealed mild 40% segmental RCA stenosis as well as 50 to 60% second diagonal branch stenosis - Suspect etiology of newly reduced EF is atrial fibrillation - DCCV planned for 7/18 deferred due to concern for LAA thrombus - Pending clinical course, consider spironolactone and SGLT2 inhibitor - Beta blocker on hold due to concern for  shock  - Continue milrinone at 0.25 -- co-ox improved with good UOP since initiation. Wean norepinephrine to MAPs of >70.  Anticipate continuing milrinone through weekend and repeating TEE possibly Monday  Atrial fibrillation - Rates in 100s to 110s - Diltiazem started earlier this admission, but stopped due to finding of newly reduced EF. Initially on metoprolol for rate control, but stopped due to concern for shock  - Planned DCCV deferred due to concern for LAA thrombus - Continue amiodarone. LFTs improving and suspect elevation is r/t congestive hepatopathy.  Will continue to monitor - Currently on heparin drip due to LAA thrombus and cholecystitis workup; however, no planned surgical intervention due to negative HIDA scan.  Bridge to Eliquis - Increase amiodarone to 60 and start low dose of digoxin, which was initially stopped due to supratherapeutic  level.  Remains tachycardic   Cardiogenic shock - TEE on 7/18 with considerable "smoke" indicating low output with EF of 25 to 30 percent - Continue milrinone at 0.25.  wean norepinephrine to MAP >70  - s/p IV Lasix x1 on 7/19 with good response.  Will re-dose today    - CVP of 7-8 now via PICC   - Monitor co-oximetry  - Monitor renal function -- BMET pending    Left atrial appendage thrombus - Noted on TEE on 7/18 - Continue heparin drip with eventual transition to Eliquis   CAD - LHC as noted above: 40% segmental mid RCA stenosis as well as 50 to 60% segmental second diagonal branch stenosis all of which can be treated medically - Beta blocker on hold due to shock.  Statin on hold due to transaminitis   Transaminitis - Surgery and GI consulted -- both believe etiology is congestive hepatopathy due to heart failure - LFTs improving - HIDA negative for cholecystitis - Started on Unasyn for cholecystitis; however, HIDA negative and WBC trending down. Will switch to ceftriaxone   Pulmonary hypertension - Echo shows severely dilated  atria - Optimize from volume standpoint - Can address medically once cardiogenic shock is stabilized    COPD - Not currently exacerbated - Flutter valve and incentive spirometry ordered   Right pleural effusion - s/p US-guided thoracentesis on 7/13 - CXR on 7/16 shows some reaccumulation - Monitor closely - Currently denies SOB and is not hypoxic  - Repeat CXR in AM     Length of Stay: 8  Peter Garter, NP  10/27/2020, 8:15 AM  Advanced Heart Failure Team Pager [phone] (M-F; 7a - 5p)  Please contact [facility] for night-coverage after hours (5p -7a ) and weekends on [email or web address]  Agree with above.   Now on milrinone 0.25 and NE 2. Co-ox 56% Feeling better. Has had excellent diuresis. CVP 8.   On IV amio and heparin. AF rates 90-110s. Personally reviewed  General:  Sitting up in bed  No resp difficulty HEENT: normal Neck: supple. JVP 8. Carotids 2+ bilat; no bruits. No lymphadenopathy or thryomegaly appreciated. Cor: PMI nondisplaced. Irregular rate & rhythm. No rubs, gallops or murmurs. Lungs: Decreased throughout Abdomen: soft, nontender, nondistended. No hepatosplenomegaly. No bruits or masses. Good bowel sounds. Extremities: no cyanosis, clubbing, rash, edema Neuro: alert & orientedx3, cranial nerves grossly intact. moves all 4 extremities w/o difficulty. Affect pleasant  Shock resolving on inotropes. Will continue milrinone. Wean NE as tolerated to keep MAP > 65. Continue IV amio and heparin. Restart low-dose digoxin. Can bump amio up to 60 as needed to get rate < 100.   Would repeat TEE next week with eye toward DC-CV if degree of smoke improved.   CRITICAL CARE Performed by: Arvilla Meres  Total critical care time: 35 minutes  Critical care time was exclusive of separately billable procedures and treating other patients.  Critical care was necessary to treat or prevent imminent or life-threatening deterioration.  Critical care was time spent personally  by me (independent of midlevel providers or residents) on the following activities: development of treatment plan with patient and/or surrogate as well as nursing, discussions with consultants, evaluation of patient's response to treatment, examination of patient, obtaining history from patient or surrogate, ordering and performing treatments and interventions, ordering and review of laboratory studies, ordering and review of radiographic studies, pulse oximetry and re-evaluation of patient's condition.  Arvilla Meres, MD  11:22 AM

## 2020-10-27 NOTE — Progress Notes (Signed)
ANTICOAGULATION CONSULT NOTE  Pharmacy Consult for IV Heparin Indication: atrial fibrillation and suspected LAA thrombus 7/18  Allergies  Allergen Reactions   Codeine Nausea And Vomiting    Patient Measurements: Height: 4\' 8"  (142.2 cm) Weight: 50.8 kg (111 lb 15.9 oz) IBW/kg (Calculated) : 36.3 Heparin Dosing Weight: 46 kg  Vital Signs: Temp: 97.4 F (36.3 C) (07/20 1600) Temp Source: Oral (07/20 1600) BP: 116/74 (07/20 0800) Pulse Rate: 122 (07/20 0800)  Labs: Recent Labs    10/25/20 0126 10/25/20 1514 10/25/20 1813 10/26/20 0639 10/27/20 0455 10/27/20 1750  HGB 14.0  --   --  13.8 12.1  --   HCT 41.2  --   --  40.4 36.2  --   PLT 406*  --   --  398 348  --   APTT 174*   < > 118* 73* 57* 94*  HEPARINUNFRC >1.10*   < > >1.10* >1.10* >1.10*  --   CREATININE 0.97  --   --  1.07* 1.08*  --    < > = values in this interval not displayed.   Estimated Creatinine Clearance: 29.9 mL/min (A) (by C-G formula based on SCr of 1.08 mg/dL (H)).   Medical History: Past Medical History:  Diagnosis Date   AMD (age related macular degeneration)    COPD (chronic obstructive pulmonary disease) (HCC)    Depression    Hiatal hernia    Intermittent low back pain    Memory impairment    Midsternal chest pain    a. 12/2011 Cardiac CTA Ca++ score of 103.3 (80th %), LAD <50p/m, RCA 50-75.   Osteoporosis    Vitamin B 12 deficiency     Assessment: 75 yr old female on apixaban PTA for atrial fibrillation (last dose 7/16 2100); apixaban on hold for elevated LFTs. DCCV not done due to possible LA appendage thrombus 7/18. Pharmacy was consulted to start IV heparin. Given recent apixaban exposure, will monitor anticoagulation using aPTT until aPTT and heparin levels correlate.  APTT therapeutic (94 sec) on 650 units/hr. Heparin level remains  > 1.1 (due to Eliquis). The aPTT lab was drawn late. No issues with line or bleeding reported per RN.   Goal of Therapy:  Heparin level 0.3-0.7  units/ml aPTT 66-102 seconds Monitor platelets by anticoagulation protocol: Yes   Plan:  Continue heparin infusion at 650 units/hr Heparin level and aPTT with morning labs  Thank you for allowing pharmacy to participate in this patient's care.  8/18, PharmD PGY1 Pharmacy Resident 10/27/2020 7:24 PM Check [email or web address] for unit specific pharmacy number

## 2020-10-27 NOTE — Consult Note (Signed)
   [facility] CM Inpatient Consult   10/27/2020  Laurie James [birth year] [id number]  [facility] [[facility]]  Accountable Care Organization [ACO] Patient: [name] Medicare  Primary Care Provider:  Georgann Housekeeper, MD, [facility] Physician, Meredeth Ide listed   Patient screened for hospitalization readmission less than 30 days  and length of stay, remains in ICU with noted high risk score for unplanned readmission risk .to assess for potential [facility] [employer] [facility]  [[facility]] Care Management service needs for post hospital transition.  Review of patient's medical record reveals patient remains at ICU level of care.  Plan:  Continue to follow progress and disposition to assess for post hospital care management needs, when appropriate.    For questions contact:   Charlesetta Shanks, RN BSN CCM [facility]  [phone] business mobile phone Toll free office [phone]  Fax number: [phone] [email or web address]

## 2020-10-28 ENCOUNTER — Inpatient Hospital Stay (HOSPITAL_COMMUNITY): Payer: Medicare Other

## 2020-10-28 DIAGNOSIS — I5033 Acute on chronic diastolic (congestive) heart failure: Secondary | ICD-10-CM | POA: Diagnosis not present

## 2020-10-28 DIAGNOSIS — R570 Cardiogenic shock: Secondary | ICD-10-CM | POA: Diagnosis not present

## 2020-10-28 LAB — HEPATIC FUNCTION PANEL
ALT: 332 U/L — ABNORMAL HIGH (ref 0–44)
AST: 302 U/L — ABNORMAL HIGH (ref 15–41)
Albumin: 2 g/dL — ABNORMAL LOW (ref 3.5–5.0)
Alkaline Phosphatase: 246 U/L — ABNORMAL HIGH (ref 38–126)
Bilirubin, Direct: 0.4 mg/dL — ABNORMAL HIGH (ref 0.0–0.2)
Indirect Bilirubin: 0.5 mg/dL (ref 0.3–0.9)
Total Bilirubin: 0.9 mg/dL (ref 0.3–1.2)
Total Protein: 5.4 g/dL — ABNORMAL LOW (ref 6.5–8.1)

## 2020-10-28 LAB — CBC
HCT: 34.2 % — ABNORMAL LOW (ref 36.0–46.0)
Hemoglobin: 11.5 g/dL — ABNORMAL LOW (ref 12.0–15.0)
MCH: 32.3 pg (ref 26.0–34.0)
MCHC: 33.6 g/dL (ref 30.0–36.0)
MCV: 96.1 fL (ref 80.0–100.0)
Platelets: 248 10*3/uL (ref 150–400)
RBC: 3.56 MIL/uL — ABNORMAL LOW (ref 3.87–5.11)
RDW: 15.7 % — ABNORMAL HIGH (ref 11.5–15.5)
WBC: 9.8 10*3/uL (ref 4.0–10.5)
nRBC: 0 % (ref 0.0–0.2)

## 2020-10-28 LAB — BASIC METABOLIC PANEL
Anion gap: 9 (ref 5–15)
BUN: 38 mg/dL — ABNORMAL HIGH (ref 8–23)
CO2: 30 mmol/L (ref 22–32)
Calcium: 7.8 mg/dL — ABNORMAL LOW (ref 8.9–10.3)
Chloride: 94 mmol/L — ABNORMAL LOW (ref 98–111)
Creatinine, Ser: 0.98 mg/dL (ref 0.44–1.00)
GFR, Estimated: >60 mL/min (ref >60)
Glucose, Bld: 160 mg/dL — ABNORMAL HIGH (ref 70–99)
Potassium: 3.5 mmol/L (ref 3.5–5.1)
Sodium: 133 mmol/L — ABNORMAL LOW (ref 135–145)

## 2020-10-28 LAB — COOXEMETRY PANEL
Carboxyhemoglobin: 1.2 % (ref 0.5–1.5)
Methemoglobin: 0.8 % (ref 0.0–1.5)
O2 Saturation: 52.9 %
Total hemoglobin: 11.8 g/dL — ABNORMAL LOW (ref 12.0–16.0)

## 2020-10-28 LAB — HEPARIN LEVEL (UNFRACTIONATED): Heparin Unfractionated: >1.1 IU/mL — ABNORMAL HIGH (ref 0.30–0.70)

## 2020-10-28 LAB — MAGNESIUM: Magnesium: 2 mg/dL (ref 1.7–2.4)

## 2020-10-28 LAB — APTT
aPTT: 108 seconds — ABNORMAL HIGH (ref 24–36)
aPTT: 130 seconds — ABNORMAL HIGH (ref 24–36)

## 2020-10-28 MED ORDER — ATORVASTATIN CALCIUM 40 MG PO TABS
40.0000 mg | ORAL_TABLET | Freq: Every day | ORAL | Status: DC
  Administered 2020-10-28 – 2020-11-04 (×8): 40 mg via ORAL
  Filled 2020-10-28 (×8): qty 1

## 2020-10-28 MED ORDER — POTASSIUM CHLORIDE 20 MEQ PO PACK
40.0000 meq | PACK | ORAL | Status: AC
  Administered 2020-10-28 (×2): 40 meq via ORAL
  Filled 2020-10-28 (×2): qty 2

## 2020-10-28 MED ORDER — POTASSIUM CHLORIDE CRYS ER 20 MEQ PO TBCR: 40.0000 meq | EXTENDED_RELEASE_TABLET | ORAL | Status: DC

## 2020-10-28 MED ORDER — FUROSEMIDE 10 MG/ML IJ SOLN
40.0000 mg | Freq: Two times a day (BID) | INTRAMUSCULAR | Status: DC
  Administered 2020-10-28 – 2020-10-29 (×3): 40 mg via INTRAVENOUS
  Filled 2020-10-28 (×3): qty 4

## 2020-10-28 MED ORDER — SPIRONOLACTONE 12.5 MG HALF TABLET
12.5000 mg | ORAL_TABLET | Freq: Every day | ORAL | Status: DC
  Administered 2020-10-28 – 2020-10-31 (×4): 12.5 mg via ORAL
  Filled 2020-10-28 (×4): qty 1

## 2020-10-28 NOTE — Progress Notes (Addendum)
Physical Therapy Treatment Patient Details Name: Laurie James MRN: [id number] DOB: May 10, 1945 Today's Date: 10/28/2020    History of Present Illness 75 y.o. F admitted 7/13 due to worsening SOB and increased weakness. Pt with pneumonia and bilateral pleural effusions, right greater than left. 7/18 TEE shows new cardiomyopathy. PMHx: newly diagnosed A. fib, COPD, hypertension, hyperlipidemia, chronic anxiety/depression, GERD.    PT Comments    Pt pleasant and able to progress gait this session but not yet able to perform stairs for entry to home or to 2nd floor. Pt states spouse and other family members will be able to assist at D/C. Pt limited by fatigue and visual deficits with encouragement to ask for assist for OOB daily and up to [facility] as able. Will continue to progress gait and mobility for return home.   HR 105-120 SpO2 96% RA    Follow Up Recommendations  Home health PT;Supervision for mobility/OOB     Equipment Recommendations  Rolling walker with 5" wheels;3in1 (PT) (youth RW)    Recommendations for Other Services       Precautions / Restrictions Precautions Precautions: Fall;Other (comment) Precaution Comments: very limited vision Restrictions Weight Bearing Restrictions: No    Mobility  Bed Mobility Overal bed mobility: Needs Assistance Bed Mobility: Supine to Sit     Supine to sit: Min assist;HOB elevated     General bed mobility comments: HOb 20 degrees with use of rail and assist to elevate trunk    Transfers Overall transfer level: Needs assistance     Sit to Stand: Min assist         General transfer comment: cues for hand placement with min assist to rise from bed and chair  Ambulation/Gait Ambulation/Gait assistance: Min assist;+2 safety/equipment Gait Distance (Feet): 80 Feet Assistive device: Rolling walker (2 wheeled) Gait Pattern/deviations: Step-through pattern;Decreased stride length;Trunk flexed   Gait velocity  interpretation: <1.8 ft/sec, indicate of risk for recurrent falls General Gait Details: mod multimodal cues for posture, direction and position in RW. pt with flexed trunk and tendency to veer left. Pt walked 45' with seated rest then additional 80'. Limited by fatigue with HR 120 max during gait   Stairs             Wheelchair Mobility    Modified Rankin (Stroke Patients Only)       Balance Overall balance assessment: Needs assistance Sitting-balance support: No upper extremity supported;Feet supported Sitting balance-Leahy Scale: Fair     Standing balance support: Bilateral upper extremity supported Standing balance-Leahy Scale: Poor Standing balance comment: bil UE support on RW                            Cognition Arousal/Alertness: Awake/alert Behavior During Therapy: WFL for tasks assessed/performed Overall Cognitive Status: Impaired/Different from baseline Area of Impairment: Awareness                           Awareness: Emergent   General Comments: pt with bed soaked in urine on arrival and pt only partially aware with linens changed, decreased awareness of safety and environment due to visual deficits      Exercises      General Comments        Pertinent Vitals/Pain Pain Assessment: No/denies pain    Home Living  Prior Function            PT Goals (current goals can now be found in the care plan section) Progress towards PT goals: Progressing toward goals    Frequency    Min 3X/week      PT Plan Current plan remains appropriate    Co-evaluation              AM-PAC PT "6 Clicks" Mobility   Outcome Measure  Help needed turning from your back to your side while in a flat bed without using bedrails?: A Little Help needed moving from lying on your back to sitting on the side of a flat bed without using bedrails?: A Little Help needed moving to and from a bed to a chair  (including a wheelchair)?: A Little Help needed standing up from a chair using your arms (e.g., wheelchair or bedside chair)?: A Little Help needed to walk in hospital room?: A Little Help needed climbing 3-5 steps with a railing? : A Lot 6 Click Score: 17    End of Session Equipment Utilized During Treatment: Gait belt Activity Tolerance: Patient tolerated treatment well Patient left: in chair;with call bell/phone within reach;with chair alarm set Nurse Communication: Mobility status PT Visit Diagnosis: Muscle weakness (generalized) (M62.81);Other abnormalities of gait and mobility (R26.89);Difficulty in walking, not elsewhere classified (R26.2)     Time: 0240-9735 PT Time Calculation (min) (ACUTE ONLY): 36 min  Charges:  $Gait Training: 8-22 mins $Therapeutic Activity: 8-22 mins                     Tradarius Reinwald P, PT Acute Rehabilitation Services Pager: [phone] Office: [phone]    Enedina Finner Shaunessy Dobratz 10/28/2020, 9:48 AM

## 2020-10-28 NOTE — Progress Notes (Signed)
Nutrition Follow Up  DOCUMENTATION CODES:   Severe malnutrition in context of chronic illness  INTERVENTION:   Liberalize diet to REGULAR  Ensure Enlive po TID, each supplement provides 350 kcal and 20 grams of protein Magic cup TID with meals, each supplement provides 290 kcal and 9 grams of protein MVI with minerals daily  NUTRITION DIAGNOSIS:   Severe Malnutrition related to chronic illness (COPD, CAD) as evidenced by severe muscle depletion, severe fat depletion.  Ongoing  GOAL:   Patient will meet greater than or equal to 90% of their needs  Progressing   MONITOR:   PO intake, Supplement acceptance  REASON FOR ASSESSMENT:   Consult Assessment of nutrition requirement/status  ASSESSMENT:   75 yo female admitted with PNA and bilateral pleural effusions. PMH includes newly diagnosed A fib, COPD, HTN, HLD, CHF, CAD, macular degeneration (legally blind), osteoporosis, vitamin B-12 deficiency.  7/13-  s/p thoracentesis with 1.7 L of fluid removed from R lung  HIDA negative for cholecystitis. Concern for congestive hepatopathy due to heart failure.   Patient reports appetite is improving. Per RN, patient consumed >75% of breakfast this am. Taking 2-3 Ensures daily. Recommend liberalizing diet to provide patient with more option given she is severely malnourished with history of poor PO intake.   UOP: 2400 ml x 24 hrs   Medications: 40 mg lasix BID, aldactone  Labs: Na 133 (L) LFTs down from yesterday   Diet Order:   Diet Order             Diet Heart Room service appropriate? Yes; Fluid consistency: Thin  Diet effective now                   EDUCATION NEEDS:   Education needs have been addressed  Skin:  Skin Assessment: Reviewed RN Assessment (pretibial skin tear)  Last BM:  7/15  Height:   Ht Readings from Last 1 Encounters:  10/20/20 4\' 8"  (1.422 m)    Weight:   Wt Readings from Last 1 Encounters:  10/28/20 50.2 kg    Ideal Body  Weight:  42.4 kg  BMI:  Body mass index is 24.81 kg/m.  Estimated Nutritional Needs:   Kcal:  1400-1600  Protein:  65-80 gm  Fluid:  1.4-1.6 L  Fredrick Geoghegan MS, RD, LDN, CNSC Clinical Nutrition Pager listed in AMION

## 2020-10-28 NOTE — TOC Initial Note (Addendum)
Transition of Care [facility]) - Initial/Assessment Note    Patient Details  Name: Laurie James MRN: [id number] Date of Birth: [birth year]  Transition of Care [facility]) CM/SW Contact:    Elliot Cousin, RN Phone Number: [phone] 10/28/2020, 4:03 PM  Clinical Narrative:                 TOC CM spoke to pt at bedside. Gave permission to speak to dtr, Tresa Endo. Spoke to dtr and pt lives in home with husband who currently works full-time. Offered choice for [facility] and agreeable to agency that will accept insurance. Pt will need youth RW, dtr states they have a bedside commode that is borrowed from a friend. She takes her to appts. Will speak to her father about [facility] and pt needs assistance with OOB/mobility per PT notes.   Will need HH PT orders with [facility].   Expected Discharge Plan: Home w Home Health Services Barriers to Discharge: Continued Medical Work up   Patient Goals and CMS Choice Patient states their goals for this hospitalization and ongoing recovery are:: wants pt to get back to her baseline CMS Medicare.gov Compare Post Acute Care list provided to:: Patient Represenative (must comment) (daughter-Kelly)    Expected Discharge Plan and Services Expected Discharge Plan: Home w Home Health Services In-house Referral: Clinical Social Work Discharge Planning Services: CM Consult Post Acute Care Choice: Home Health Living arrangements for the past 2 months: Single Family Home                                      Prior Living Arrangements/Services Living arrangements for the past 2 months: Single Family Home Lives with:: Spouse Patient language and need for interpreter reviewed:: No Do you feel safe going back to the place where you live?: Yes      Need for Family Participation in Patient Care: Yes (Comment) Care giver support system in place?: Yes (comment) Current home services: DME (bedside commode (borrowed)) Criminal Activity/Legal Involvement Pertinent to  Current Situation/Hospitalization: No - Comment as needed  Activities of Daily Living Home Assistive Devices/Equipment: Dentures (specify type) (upper) ADL Screening (condition at time of admission) Patient's cognitive ability adequate to safely complete daily activities?: Yes Is the patient deaf or have difficulty hearing?: Yes Does the patient have difficulty seeing, even when wearing glasses/contacts?: Yes (pt stated she had s AMD) Does the patient have difficulty concentrating, remembering, or making decisions?: No Patient able to express need for assistance with ADLs?: Yes Does the patient have difficulty dressing or bathing?: No Independently performs ADLs?: Yes (appropriate for developmental age) Does the patient have difficulty walking or climbing stairs?: Yes (easily short of breath) Weakness of Legs: None Weakness of Arms/Hands: None  Permission Sought/Granted Permission sought to share information with : Case Manager, PCP, Family Supports Permission granted to share information with : Yes, Verbal Permission Granted  Share Information with NAME: Ella Jubilee  Permission granted to share info w AGENCY: Home Health Agencies, DME agency  Permission granted to share info w Relationship: daughter  Permission granted to share info w Contact Information: [phone]  Emotional Assessment Appearance:: Appears stated age Attitude/Demeanor/Rapport: Engaged Affect (typically observed): Accepting, Pleasant Orientation: : Oriented to Place, Oriented to Self, Oriented to  Time, Oriented to Situation   Psych Involvement: No (comment)  Admission diagnosis:  Shortness of breath [R06.02] Weakness [R53.1] Pleural effusion [J90] Bilateral pleural effusion [J90]  Leukocytosis, unspecified type [D72.829] Patient Active Problem List   Diagnosis Date Noted   OSA (obstructive sleep apnea)    Protein-calorie malnutrition, severe 10/22/2020   Depression    Acute on chronic diastolic CHF  (congestive heart failure) (HCC)    Bilateral pleural effusion 10/19/2020   Atrial fibrillation (HCC) 10/11/2020   Pulmonary HTN (HCC) 10/11/2020   Tricuspid regurgitation 10/11/2020   HLD (hyperlipidemia) 10/11/2020   Hypotension 10/11/2020   Hypokalemia 10/11/2020   Chest pain at rest 10/05/2020   CAD (coronary artery disease) 04/24/2014   COPD (chronic obstructive pulmonary disease) (HCC) 04/24/2014   PCP:  Georgann Housekeeper, MD Pharmacy:   [facility] [address] [place] - [address] [facility] [place] [address] Phone: [phone] Fax: [phone]     Social Determinants of Health (SDOH) Interventions Transportation Interventions: Intervention Not Indicated  Readmission Risk Interventions No flowsheet data found.

## 2020-10-28 NOTE — Progress Notes (Signed)
Occupational Therapy Treatment Patient Details Name: Laurie James MRN: [id number] DOB: 1946-01-11 Today's Date: 10/28/2020    History of present illness 75 y.o. F admitted 7/13 due to worsening SOB and increased weakness. Pt with pneumonia and bilateral pleural effusions, right greater than left. 7/18 TEE shows new cardiomyopathy. PMHx: newly diagnosed A. fib, COPD, hypertension, hyperlipidemia, chronic anxiety/depression, GERD, memory impairment.   OT comments  Patient seated in recliner upon entry.  Patient declined transfer to [facility], but agreeable to engaging in LB dressing tasks.  Requires mod assist for simulated LB dressing, decreased problem solving with poor initiation and sequencing with attempting to thread LE or manage socks (reports doesn't wear socks at home but does wear pajama pants).  She reports family will assist at home and she has 24/7 support.  Continue per POC and recommend further visual assessment next session (pt with limited report, unclear if different then baseline or baseline deficits). Will follow acutely.    Follow Up Recommendations  Home health OT;Supervision/Assistance - 24 hour    Equipment Recommendations  Other (comment) (RW)    Recommendations for Other Services      Precautions / Restrictions Precautions Precautions: Fall;Other (comment) Precaution Comments: very limited vision Restrictions Weight Bearing Restrictions: No       Mobility Bed Mobility Overal bed mobility: Needs Assistance Bed Mobility: Supine to Sit     Supine to sit: Min assist;HOB elevated     General bed mobility comments: OOB in recliner upon entry    Transfers Overall transfer level: Needs assistance Equipment used: Rolling walker (2 wheeled) Transfers: Sit to/from Stand Sit to Stand: Min assist         General transfer comment: cueing for hand placement and min assist to rise from recliner    Balance Overall balance assessment: Needs  assistance Sitting-balance support: No upper extremity supported;Feet supported Sitting balance-Leahy Scale: Fair     Standing balance support: Bilateral upper extremity supported;During functional activity Standing balance-Leahy Scale: Poor Standing balance comment: bil UE support on RW                           ADL either performed or assessed with clinical judgement   ADL Overall ADL's : Needs assistance/impaired                     Lower Body Dressing: Moderate assistance;Sit to/from stand Lower Body Dressing Details (indicate cue type and reason): requires assist for socks (but reports doens't wear at home), requires assist to fully thread LB into "simulated pants", min guard in standing to pull over hims   Toilet Transfer Details (indicate cue type and reason): pt declined         Functional mobility during ADLs: Min guard;Rolling walker;Cueing for sequencing;Cueing for safety General ADL Comments: pt limited by impaired activity tolerance, weakness, vision and cognition     Vision   Additional Comments: pt reports hx of vision loss, pt reports 'not seeing well' and reports light increases glare.  continue to assess.   Perception     Praxis      Cognition Arousal/Alertness: Awake/alert Behavior During Therapy: WFL for tasks assessed/performed Overall Cognitive Status: Impaired/Different from baseline Area of Impairment: Awareness;Problem solving                           Awareness: Emergent Problem Solving: Slow processing;Difficulty sequencing;Requires verbal cues;Decreased initiation General Comments: patient  with difficutly problem solving through ADLs requring up to max cueing, decreased awareness; hx of memory impairment at baseline        Exercises     Shoulder Instructions       General Comments VSS    Pertinent Vitals/ Pain       Pain Assessment: No/denies pain Faces Pain Scale: No hurt  Home Living                                           Prior Functioning/Environment              Frequency  Min 2X/week        Progress Toward Goals  OT Goals(current goals can now be found in the care plan section)  Progress towards OT goals: Progressing toward goals  Acute Rehab OT Goals Patient Stated Goal: home OT Goal Formulation: With patient  Plan Discharge plan remains appropriate;Frequency remains appropriate    Co-evaluation                 AM-PAC OT "6 Clicks" Daily Activity     Outcome Measure   Help from another person eating meals?: A Little Help from another person taking care of personal grooming?: A Little Help from another person toileting, which includes using toliet, bedpan, or urinal?: A Little Help from another person bathing (including washing, rinsing, drying)?: A Lot Help from another person to put on and taking off regular upper body clothing?: A Little Help from another person to put on and taking off regular lower body clothing?: A Lot 6 Click Score: 16    End of Session Equipment Utilized During Treatment: Rolling walker  OT Visit Diagnosis: Unsteadiness on feet (R26.81);Other abnormalities of gait and mobility (R26.89);Muscle weakness (generalized) (M62.81)   Activity Tolerance Patient limited by fatigue   Patient Left in chair;with call bell/phone within reach;with chair alarm set;Other (comment) (SLP in room)   Nurse Communication Mobility status        Time: [phone] OT Time Calculation (min): 15 min  Charges: OT General Charges $OT Visit: 1 Visit OT Treatments $Self Care/Home Management : 8-22 mins  Laurie James, OT Acute Rehabilitation Services Pager [phone] Office [phone]    Chancy Milroy 10/28/2020, 10:48 AM

## 2020-10-28 NOTE — Progress Notes (Signed)
ANTICOAGULATION CONSULT NOTE  Pharmacy Consult for IV Heparin Indication: atrial fibrillation and suspected LAA thrombus 7/18  Allergies  Allergen Reactions   Codeine Nausea And Vomiting    Patient Measurements: Height: 4\' 8"  (142.2 cm) Weight: 50.2 kg (110 lb 10.7 oz) IBW/kg (Calculated) : 36.3 Heparin Dosing Weight: 46 kg  Vital Signs: Temp: 97.5 F (36.4 C) (07/21 0845) Temp Source: Oral (07/21 0845) BP: 106/62 (07/21 0630) Pulse Rate: 81 (07/21 0630)  Labs: Recent Labs    10/26/20 0639 10/27/20 0455 10/27/20 1750 10/28/20 0214 10/28/20 1237  HGB 13.8 12.1  --  11.5*  --   HCT 40.4 36.2  --  34.2*  --   PLT 398 348  --  248  --   APTT 73* 57* 94* 108* 130*  HEPARINUNFRC >1.10* >1.10*  --  >1.10*  --   CREATININE 1.07* 1.08*  --  0.98  --    Estimated Creatinine Clearance: 32.8 mL/min (by C-G formula based on SCr of 0.98 mg/dL).   Medical History: Past Medical History:  Diagnosis Date   AMD (age related macular degeneration)    COPD (chronic obstructive pulmonary disease) (HCC)    Depression    Hiatal hernia    Intermittent low back pain    Memory impairment    Midsternal chest pain    a. 12/2011 Cardiac CTA Ca++ score of 103.3 (80th %), LAD <50p/m, RCA 50-75.   Osteoporosis    Vitamin B 12 deficiency     Assessment: 75 yr old female on apixaban PTA for atrial fibrillation (last dose 7/16 2100); apixaban on hold for elevated LFTs. DCCV not done due to possible LA appendage thrombus 7/18. Pharmacy was consulted to start IV heparin. Given recent apixaban exposure, will monitor anticoagulation using aPTT until aPTT and heparin levels correlate.  APTT now up supratherapeutic (130 sec) on 600 units/hr. Heparin running in PIV and albs being drawn from PICC, CBC stable Heparin level remains  > 1.1 (due to Eliquis). No issues with line or bleeding reported per RN.   Goal of Therapy:  Heparin level 0.3-0.7 units/ml aPTT 66-102 seconds Monitor platelets by  anticoagulation protocol: Yes   Plan:  Will decrease heparin infusion to 450 units/hr  Daily aptt CBC Restart Eliquis when able    8/18.D. CPP, BCPS Clinical Pharmacist [phone] 10/28/2020 4:00 PM   Please see amion for complete clinical pharmacist phone list 10/28/2020 3:58 PM

## 2020-10-28 NOTE — Progress Notes (Signed)
  Speech Language Pathology Treatment: Dysphagia  Patient Details Name: Laurie James MRN: [id number] DOB: 1945/07/20 Today's Date: 10/28/2020 Time: 0034-9179 SLP Time Calculation (min) (ACUTE ONLY): 10 min  Assessment / Plan / Recommendation Clinical Impression  Pt was seen for dysphagia treatment and she was cooperative throughout the session. Pt, and nursing Thedore Mins, RN) reported that the pt has been tolerating the current diet without overt s/sx of aspiration. Pt tolerated regular texture solids, and thin liquids via straw using consecutive swallows without symptoms of oropharyngeal dysphagia. Mastication time was functionally increased secondary to edentulous status. It is recommended that the current diet be continued. Further skilled SLP services are not clinically indicated at this time.    HPI HPI: 75 y.o. F admitted to [facility] due to worsening SOB and increased weakness. Work-up reveals pneumonia and bilateral pleural effusions, right greater than left. Her prior medical history is significant for newly diagnosed A. fib, COPD, hypertension, hyperlipidemia, chronic anxiety/depression, GERD, HH, memory impairment.      SLP Plan  Discharge SLP treatment due to (comment);All goals met       Recommendations  Diet recommendations: Regular;Thin liquid Liquids provided via: Cup;Straw Medication Administration: Whole meds with liquid Supervision: Staff to assist with self feeding Compensations: Slow rate;Small sips/bites Postural Changes and/or Swallow Maneuvers: Seated upright 90 degrees                Oral Care Recommendations: Oral care BID Follow up Recommendations: None SLP Visit Diagnosis: Dysphagia, unspecified (R13.10) Plan: Discharge SLP treatment due to (comment);All goals met       Sharol Croghan I. Hardin Negus, [place], [place] Office number [phone] Pager [place] 10/28/2020, 10:14  AM

## 2020-10-28 NOTE — Progress Notes (Addendum)
Advanced Heart Failure Rounding Note  PCP-Cardiologist: Charlton Haws, MD  AHF: Dr. Gala Romney   Patient Profile   75 y/o woman with severe COPD, blindness. No known prior cardiac history. Poor baseline functional capacity over the past 2 years.  Admitted 6/22 with new onset AF. EF normal RV dilated. Cath with nonobstructive CAD. Sent home with [facility] and rate control w/ plans for outpatient DCCV after 3 weeks of a/c.   Readmitted 7/22 with acute HF. TEE EF 20% RV moderately down RA massively dilated. Heavy smoke in RA/LA and aortic root. DCCV canceled.    Developed shock with elevated LFTs. Moved to ICU.   PICC placed. Initial Co-ox 38%. CVP 12. Started on milrinone.   Subjective:    Remains in Afib w/ CVR in the 80s. Amio gtt at 30/hr   On Milrinone 0.25. Co-ox marginal at 53%.   Remains fluid overloaded, Wt up > 10 lb above baseline. CVP 10-11. F/u CXR today w/ with diffuse bilateral interstitial prominence and small bilateral pleural effusions, consistent with CHF.  Overall feeling better, appetite picking up. Denies CP and dyspnea. No palpitations.  Daughter at bedside.   Objective:   Weight Range: 50.2 kg Body mass index is 24.81 kg/m.   Vital Signs:   Temp:  [96.7 F (35.9 C)-97.5 F (36.4 C)] 97.5 F (36.4 C) (07/21 0300) Pulse Rate:  [73-120] 81 (07/21 0630) Resp:  [13-33] 26 (07/21 0630) BP: (99-131)/(57-72) 106/62 (07/21 0630) SpO2:  [94 %-97 %] 95 % (07/21 0630) Weight:  [50.2 kg] 50.2 kg (07/21 0500) Last BM Date: 10/22/20  Weight change: Filed Weights   10/25/20 0453 10/27/20 0500 10/28/20 0500  Weight: 46.4 kg 50.8 kg 50.2 kg    Intake/Output:   Intake/Output Summary (Last 24 hours) at 10/28/2020 0820 Last data filed at 10/28/2020 0600 Gross per 24 hour  Intake 781.75 ml  Output 2200 ml  Net -1418.25 ml      Physical Exam   CVP 10-11 General:  thin elderly WF. No respiratory difficulty HEENT: normal Neck: supple.  JVD 10 cm. Carotids 2+  bilat; no bruits. No lymphadenopathy or thyromegaly appreciated. Cor: PMI nondisplaced. Irregularly irregular rhythm. No rubs, gallops or murmurs. Lungs: clear Abdomen: soft, nontender, nondistended. No hepatosplenomegaly. No bruits or masses. Good bowel sounds. Extremities: no cyanosis, clubbing, rash, trace bilateral ankle edema Neuro: alert & oriented x 3, cranial nerves grossly intact. moves all 4 extremities w/o difficulty. Affect pleasant.   Telemetry   Afib w/ CVR 80s  Personally reviewed   EKG    No new EKG   Labs    CBC Recent Labs    10/26/20 0639 10/27/20 0455 10/28/20 0214  WBC 12.8* 12.4* 9.8  NEUTROABS 10.8* 10.5*  --   HGB 13.8 12.1 11.5*  HCT 40.4 36.2 34.2*  MCV 96.2 96.0 96.1  PLT 398 348 248   Basic Metabolic Panel Recent Labs    00/51/10 0455 10/28/20 0214  NA 131* 133*  K 3.2* 3.5  CL 93* 94*  CO2 26 30  GLUCOSE 254* 160*  BUN 50* 38*  CREATININE 1.08* 0.98  CALCIUM 7.9* 7.8*  MG 2.2 2.0   Liver Function Tests Recent Labs    10/27/20 0455 10/28/20 0214  AST 387* 302*  ALT 376* 332*  ALKPHOS 247* 246*  BILITOT 1.2 0.9  PROT 5.6* 5.4*  ALBUMIN 2.1* 2.0*   No results for input(s): LIPASE, AMYLASE in the last 72 hours. Cardiac Enzymes No results for input(s): CKTOTAL,  CKMB, CKMBINDEX, TROPONINI in the last 72 hours.  BNP: BNP (last 3 results) Recent Labs    10/25/20 0641 10/26/20 0639 10/27/20 0455  BNP 1,137.5* 990.2* 1,027.1*    ProBNP (last 3 results) No results for input(s): PROBNP in the last 8760 hours.   D-Dimer No results for input(s): DDIMER in the last 72 hours. Hemoglobin A1C No results for input(s): HGBA1C in the last 72 hours. Fasting Lipid Panel No results for input(s): CHOL, HDL, LDLCALC, TRIG, CHOLHDL, LDLDIRECT in the last 72 hours. Thyroid Function Tests No results for input(s): TSH, T4TOTAL, T3FREE, THYROIDAB in the last 72 hours.  Invalid input(s): FREET3   Other results:   Imaging    DG  CHEST PORT 1 VIEW  Result Date: 10/28/2020 CLINICAL DATA:  Shortness of breath.  Pleural effusion. EXAM: PORTABLE CHEST 1 VIEW COMPARISON:  10/26/2020. FINDINGS: Right PICC line noted with tip over SVC. Cardiomegaly with bilateral interstitial prominence and bilateral pleural effusions. Findings suggest CHF. Bibasilar atelectasis. No pneumothorax. Degenerative changes scoliosis thoracic spine. IMPRESSION: 1.  Right PICC line noted with its tip over the SVC. 2. Cardiomegaly with diffuse bilateral interstitial prominence and small bilateral pleural effusions. Findings consistent with CHF. 3.  Bibasilar atelectasis. Electronically Signed   By: Maisie Fus  Register   On: 10/28/2020 07:25     Medications:     Scheduled Medications:  Chlorhexidine Gluconate Cloth  6 each Topical Daily   citalopram  20 mg Oral QHS   feeding supplement  237 mL Oral TID BM   multivitamin with minerals  1 tablet Oral Daily   nystatin  5 mL Oral QID   pantoprazole  40 mg Oral Daily   potassium chloride  40 mEq Oral Q4H   sodium chloride flush  10-40 mL Intracatheter Q12H    Infusions:  amiodarone 30 mg/hr (10/28/20 0600)   cefTRIAXone (ROCEPHIN)  IV Stopped (10/27/20 1640)   heparin 600 Units/hr (10/28/20 0600)   milrinone 0.25 mcg/kg/min (10/28/20 0600)   norepinephrine (LEVOPHED) Adult infusion Stopped (10/27/20 1049)    PRN Medications: lidocaine (PF), melatonin, metoprolol tartrate, ondansetron (ZOFRAN) IV, polyethylene glycol     Assessment/Plan    Acute HFrEF/ Nonischemic cardiomyopathy - Echo on 7/18 revealed an EF of 25 to 30 percent, decreased from 55 to 60 percent on 10/06/2020 - Recent left heart catheterization on 10/07/2020 revealed mild 40% segmental RCA stenosis as well as 50 to 60% second diagonal branch stenosis - Suspect etiology of newly reduced EF is atrial fibrillation - DCCV planned for 7/18 deferred due to concern for LAA thrombus - Co-ox 53%. Continue milrinone 0.25  - CVP 10-11, Wt  up > 10 lb from baseline. Increase Lasix to 40 mg bid  - Add Spiro 12.5 mg daily  - Off digoxin due to elevated level  - Beta blocker on hold due to shock  - Anticipate continuing milrinone through weekend and repeating TEE possibly Monday  2. Atrial fibrillation - Rates improved, now in the 80s  - Diltiazem started earlier this admission, but stopped due to finding of newly reduced EF. Initially on metoprolol for rate control, but stopped due to concern for shock  - Planned DCCV deferred due to concern for LAA thrombus - Continue amiodarone at 30/hr.  LFTs improving and suspect elevation is r/t congestive hepatopathy.  Will continue to monitor - Currently on heparin drip due to LAA thrombus and cholecystitis workup; however, no planned surgical intervention due to negative HIDA scan.  Bridge to [employer]  3. Cardiogenic shock - TEE on 7/18 with considerable "smoke" indicating low output with EF of 25 to 30 percent - Initial Co-ox 38%, started on Milrinone  - On milrinone at 0.25.  Co-ox improved but marginal at 53% - CVP 10-11, continue IV diuretics per above    - Monitor co-oximetry  - Monitor renal function, SCr stable    4. Left atrial appendage thrombus - Noted on TEE on 7/18 - Continue heparin drip with eventual transition to Eliquis   5. CAD - LHC as noted above: 40% segmental mid RCA stenosis as well as 50 to 60% segmental second diagonal branch stenosis all of which can be treated medically - Beta blocker on hold due to shock.  Statin on hold due to transaminitis   6. Transaminitis - Surgery and GI consulted -- both believe etiology is congestive hepatopathy due to heart failure - LFTs improving - HIDA negative for cholecystitis - Started on Unasyn for cholecystitis; however, HIDA negative and WBC trending down. C/w ceftriaxone (last dose 7/22)    7. Pulmonary hypertension - Echo shows severely dilated atria - Optimize from volume standpoint - Can address medically  once cardiogenic shock is stabilized    8. COPD - Not currently exacerbated - Flutter valve and incentive spirometry ordered   9. Right pleural effusion - s/p US-guided thoracentesis on 7/13 - CXR on 7/16 shows some reaccumulation - Currently denies SOB and is not hypoxic  - Repeat CXR today w/ small bilateral pleural effusions     Length of Stay: [address], PA-C  10/28/2020, 8:20 AM  Advanced Heart Failure Team Pager [phone] (M-F; 7a - 5p)  Please contact [facility] for night-coverage after hours (5p -7a ) and weekends on [email or web address]  Agree with above.   Remains on milrinone 0.25. Co-ox marginal at 53% CVP up with volume overload on CXR.  Remains in AF but rate now controlled on IV amio. Shock liver and acidosis improving.   General:  Elderly NAD HEENT: normal Neck: supple.JVP 10 Carotids 2+ bilat; no bruits. No lymphadenopathy or thryomegaly appreciated. Cor: PMI nondisplaced. Irregular rate & rhythm. No rubs, gallops or murmurs. Lungs: Markedly reduced throughout  Abdomen: soft, nontender, nondistended. No hepatosplenomegaly. No bruits or masses. Good bowel sounds. Extremities: no cyanosis, clubbing, rash, edema Neuro: alert & orientedx3, cranial nerves grossly intact. moves all 4 extremities w/o difficulty. Affect pleasant   Will increase milrinone to 0.375. Continue IV amio. Would continue heparin for at least one more day and then can switch to Eliquis. Give one dose IV lasix.   Plan repeat TEE next week to see if smoke has resolved and can perform DC-CV. Initial echo suggestive of underlying pulmonary HTN with markedly dilated RA so may take some effort to maintain NSR.   CRITICAL CARE Performed by: Arvilla Meres  Total critical care time: 35 minutes  Critical care time was exclusive of separately billable procedures and treating other patients.  Critical care was necessary to treat or prevent imminent or life-threatening  deterioration.  Critical care was time spent personally by me (independent of midlevel providers or residents) on the following activities: development of treatment plan with patient and/or surrogate as well as nursing, discussions with consultants, evaluation of patient's response to treatment, examination of patient, obtaining history from patient or surrogate, ordering and performing treatments and interventions, ordering and review of laboratory studies, ordering and review of radiographic studies, pulse oximetry and re-evaluation of patient's condition.  Arvilla Meres, MD  11:16 AM

## 2020-10-28 NOTE — Progress Notes (Signed)
ANTICOAGULATION CONSULT NOTE  Pharmacy Consult for IV Heparin Indication: atrial fibrillation and suspected LAA thrombus 7/18  Allergies  Allergen Reactions   Codeine Nausea And Vomiting    Patient Measurements: Height: 4\' 8"  (142.2 cm) Weight: 50.8 kg (111 lb 15.9 oz) IBW/kg (Calculated) : 36.3 Heparin Dosing Weight: 46 kg  Vital Signs: Temp: 97.4 F (36.3 C) (07/20 2300) Temp Source: Oral (07/20 2300) BP: 109/58 (07/20 2300) Pulse Rate: 74 (07/20 2300)  Labs: Recent Labs    10/26/20 0639 10/27/20 0455 10/27/20 1750 10/28/20 0214  HGB 13.8 12.1  --  11.5*  HCT 40.4 36.2  --  34.2*  PLT 398 348  --  248  APTT 73* 57* 94* 108*  HEPARINUNFRC >1.10* >1.10*  --  >1.10*  CREATININE 1.07* 1.08*  --   --    Estimated Creatinine Clearance: 29.9 mL/min (A) (by C-G formula based on SCr of 1.08 mg/dL (H)).   Medical History: Past Medical History:  Diagnosis Date   AMD (age related macular degeneration)    COPD (chronic obstructive pulmonary disease) (HCC)    Depression    Hiatal hernia    Intermittent low back pain    Memory impairment    Midsternal chest pain    a. 12/2011 Cardiac CTA Ca++ score of 103.3 (80th %), LAD <50p/m, RCA 50-75.   Osteoporosis    Vitamin B 12 deficiency     Assessment: 75 yr old female on apixaban PTA for atrial fibrillation (last dose 7/16 2100); apixaban on hold for elevated LFTs. DCCV not done due to possible LA appendage thrombus 7/18. Pharmacy was consulted to start IV heparin. Given recent apixaban exposure, will monitor anticoagulation using aPTT until aPTT and heparin levels correlate.  APTT now up to slightly supratherapeutic (108 sec) on 650 units/hr. Heparin level remains  > 1.1 (due to Eliquis). No issues with line or bleeding reported per RN.   Goal of Therapy:  Heparin level 0.3-0.7 units/ml aPTT 66-102 seconds Monitor platelets by anticoagulation protocol: Yes   Plan:  Will decrease heparin infusion to 600 units/hr  aPTT  in 8 hours Restart Eliquis when able  8/18, PharmD, BCPS Please see amion for complete clinical pharmacist phone list 10/28/2020 3:02 AM

## 2020-10-29 DIAGNOSIS — I5033 Acute on chronic diastolic (congestive) heart failure: Secondary | ICD-10-CM | POA: Diagnosis not present

## 2020-10-29 DIAGNOSIS — R570 Cardiogenic shock: Secondary | ICD-10-CM | POA: Diagnosis not present

## 2020-10-29 LAB — BASIC METABOLIC PANEL
Anion gap: 9 (ref 5–15)
BUN: 31 mg/dL — ABNORMAL HIGH (ref 8–23)
CO2: 31 mmol/L (ref 22–32)
Calcium: 8.4 mg/dL — ABNORMAL LOW (ref 8.9–10.3)
Chloride: 91 mmol/L — ABNORMAL LOW (ref 98–111)
Creatinine, Ser: 0.91 mg/dL (ref 0.44–1.00)
GFR, Estimated: >60 mL/min (ref >60)
Glucose, Bld: 138 mg/dL — ABNORMAL HIGH (ref 70–99)
Potassium: 4.5 mmol/L (ref 3.5–5.1)
Sodium: 131 mmol/L — ABNORMAL LOW (ref 135–145)

## 2020-10-29 LAB — CBC
HCT: 35.1 % — ABNORMAL LOW (ref 36.0–46.0)
HCT: 36.9 % (ref 36.0–46.0)
Hemoglobin: 11.9 g/dL — ABNORMAL LOW (ref 12.0–15.0)
Hemoglobin: 12.1 g/dL (ref 12.0–15.0)
MCH: 32.5 pg (ref 26.0–34.0)
MCH: 31.9 pg (ref 26.0–34.0)
MCHC: 33.9 g/dL (ref 30.0–36.0)
MCHC: 32.8 g/dL (ref 30.0–36.0)
MCV: 95.9 fL (ref 80.0–100.0)
MCV: 97.4 fL (ref 80.0–100.0)
Platelets: 247 10*3/uL (ref 150–400)
Platelets: 248 10*3/uL (ref 150–400)
RBC: 3.66 MIL/uL — ABNORMAL LOW (ref 3.87–5.11)
RBC: 3.79 MIL/uL — ABNORMAL LOW (ref 3.87–5.11)
RDW: 15.9 % — ABNORMAL HIGH (ref 11.5–15.5)
RDW: 16 % — ABNORMAL HIGH (ref 11.5–15.5)
WBC: 11.9 10*3/uL — ABNORMAL HIGH (ref 4.0–10.5)
WBC: 13.6 10*3/uL — ABNORMAL HIGH (ref 4.0–10.5)
nRBC: 0 % (ref 0.0–0.2)
nRBC: 0 % (ref 0.0–0.2)

## 2020-10-29 LAB — COOXEMETRY PANEL
Carboxyhemoglobin: 1.4 % (ref 0.5–1.5)
Methemoglobin: 1.1 % (ref 0.0–1.5)
O2 Saturation: 67.3 %
Total hemoglobin: 12 g/dL (ref 12.0–16.0)

## 2020-10-29 LAB — HEPARIN LEVEL (UNFRACTIONATED): Heparin Unfractionated: >1.1 IU/mL — ABNORMAL HIGH (ref 0.30–0.70)

## 2020-10-29 LAB — URINE CULTURE

## 2020-10-29 LAB — APTT
aPTT: 44 seconds — ABNORMAL HIGH (ref 24–36)
aPTT: 47 seconds — ABNORMAL HIGH (ref 24–36)

## 2020-10-29 MED ORDER — PANTOPRAZOLE SODIUM 40 MG IV SOLR
40.0000 mg | Freq: Two times a day (BID) | INTRAVENOUS | Status: DC
  Administered 2020-10-29 – 2020-11-01 (×6): 40 mg via INTRAVENOUS
  Filled 2020-10-29 (×6): qty 40

## 2020-10-29 NOTE — Progress Notes (Signed)
Home equipment arrived at bedside and daughter Tresa Endo will take home.  Rolling seated walker and bedside commode.

## 2020-10-29 NOTE — Progress Notes (Signed)
Physical Therapy Treatment Patient Details Name: Laurie James MRN: [id number] DOB: 1945/10/07 Today's Date: 10/29/2020    History of Present Illness 75 y.o. F admitted 7/13 due to worsening SOB and increased weakness. Pt with pneumonia and bilateral pleural effusions, right greater than left. 7/18 TEE shows new cardiomyopathy. PMHx: newly diagnosed A. fib, COPD, hypertension, hyperlipidemia, chronic anxiety/depression, GERD, memory impairment.    PT Comments    Pt tolerated treatment well. Pt still required min A for sit to stand transfer, but decreased A from prior visit. Ambulation distance decreased from previous session. Pt demonstrates impairments in gait, mobility, and activity tolerance. Recommend continuation of PT to improve function prior to d/c.  Follow Up Recommendations  Home health PT;Supervision for mobility/OOB     Equipment Recommendations  Rolling walker with 5" wheels;3in1 (PT)    Recommendations for Other Services       Precautions / Restrictions Precautions Precautions: Fall Precaution Comments: Limited vision Restrictions Weight Bearing Restrictions: No    Mobility  Bed Mobility Overal bed mobility: Needs Assistance Bed Mobility: Supine to Sit     Supine to sit: Min assist;HOB elevated     General bed mobility comments: In bed upon arrival. Min A to elevate trunk to achieve seated positon at EOB    Transfers Overall transfer level: Needs assistance Equipment used: Rolling walker (2 wheeled) Transfers: Sit to/from Stand Sit to Stand: Min assist         General transfer comment: Cues for hand placement and alignment of trunk. min A to power up to stand  Ambulation/Gait Ambulation/Gait assistance: Min assist;+2 safety/equipment ((chair follow)) Gait Distance (Feet): 50 Feet Assistive device: Rolling walker (2 wheeled) Gait Pattern/deviations: Step-through pattern;Decreased stride length;Trunk flexed   Gait velocity  interpretation: <1.8 ft/sec, indicate of risk for recurrent falls General Gait Details: Ambulated 74ft then an additional 63ft with 2 seated rest breaks. Multimodal cues provided to stand upright and navigate RW. Max HR 145 with ambulation.   Stairs             Wheelchair Mobility    Modified Rankin (Stroke Patients Only)       Balance Overall balance assessment: Needs assistance Sitting-balance support: No upper extremity supported;Feet supported Sitting balance-Leahy Scale: Fair     Standing balance support: Bilateral upper extremity supported;During functional activity Standing balance-Leahy Scale: Poor Standing balance comment: bil UE support on RW with increased trunk flexion                            Cognition Arousal/Alertness: Awake/alert Behavior During Therapy: WFL for tasks assessed/performed Overall Cognitive Status: Within Functional Limits for tasks assessed                                 General Comments: Responds to commands with multimodal cues provided.      Exercises General Exercises - Lower Extremity Long Arc Quad: AROM;Both;10 reps Hip Flexion/Marching: AROM;Both;10 reps Toe Raises: AROM;Both;10 reps    General Comments General comments (skin integrity, edema, etc.): Max HR 145. VSS      Pertinent Vitals/Pain Pain Assessment: No/denies pain    Home Living                      Prior Function            PT Goals (current goals can now be found in  the care plan section) Acute Rehab PT Goals Patient Stated Goal: home PT Goal Formulation: With patient/family Time For Goal Achievement: 11/05/20 Potential to Achieve Goals: Good Progress towards PT goals: Progressing toward goals    Frequency           PT Plan Current plan remains appropriate    Co-evaluation              AM-PAC PT "6 Clicks" Mobility   Outcome Measure  Help needed turning from your back to your side while in a  flat bed without using bedrails?: A Little Help needed moving from lying on your back to sitting on the side of a flat bed without using bedrails?: A Little Help needed moving to and from a bed to a chair (including a wheelchair)?: A Little Help needed standing up from a chair using your arms (e.g., wheelchair or bedside chair)?: A Little Help needed to walk in hospital room?: A Little Help needed climbing 3-5 steps with a railing? : A Lot 6 Click Score: 17    End of Session Equipment Utilized During Treatment: Gait belt Activity Tolerance: Patient tolerated treatment well Patient left: in chair;with call bell/phone within reach;with chair alarm set;with family/visitor present Nurse Communication: Mobility status PT Visit Diagnosis: Muscle weakness (generalized) (M62.81);Other abnormalities of gait and mobility (R26.89);Difficulty in walking, not elsewhere classified (R26.2)     Time: 0930-1006 PT Time Calculation (min) (ACUTE ONLY): 36 min  Charges:  $Gait Training: 8-22 mins $Therapeutic Activity: 8-22 mins                     Laurie James, SPT Acute Rehab: [phone]    Laurie James 10/29/2020, 10:25 AM

## 2020-10-29 NOTE — Progress Notes (Signed)
ANTICOAGULATION CONSULT NOTE  Pharmacy Consult for IV Heparin Indication: atrial fibrillation and suspected LAA thrombus 7/18  Allergies  Allergen Reactions   Codeine Nausea And Vomiting    Patient Measurements: Height: 4\' 8"  (142.2 cm) Weight: 51.2 kg (112 lb 14 oz) IBW/kg (Calculated) : 36.3 Heparin Dosing Weight: 46 kg  Vital Signs: Temp: 97.5 F (36.4 C) (07/22 1602) Temp Source: Oral (07/22 1602) BP: 102/60 (07/22 1300) Pulse Rate: 121 (07/22 1300)  Labs: Recent Labs    10/27/20 0455 10/27/20 1750 10/28/20 0214 10/28/20 1237 10/29/20 0244 10/29/20 1214  HGB 12.1  --  11.5*  --  11.9* 12.1  HCT 36.2  --  34.2*  --  35.1* 36.9  PLT 348  --  248  --  247 248  APTT 57*   < > 108* 130* 44* 47*  HEPARINUNFRC >1.10*  --  >1.10*  --  >1.10*  --   CREATININE 1.08*  --  0.98  --  0.91  --    < > = values in this interval not displayed.   Estimated Creatinine Clearance: 35.7 mL/min (by C-G formula based on SCr of 0.91 mg/dL).   Medical History: Past Medical History:  Diagnosis Date   AMD (age related macular degeneration)    COPD (chronic obstructive pulmonary disease) (HCC)    Depression    Hiatal hernia    Intermittent low back pain    Memory impairment    Midsternal chest pain    a. 12/2011 Cardiac CTA Ca++ score of 103.3 (80th %), LAD <50p/m, RCA 50-75.   Osteoporosis    Vitamin B 12 deficiency     Assessment: 75 yr old female on apixaban PTA for atrial fibrillation (last dose 7/16 2100); apixaban on hold for elevated LFTs. DCCV not done due to possible LA appendage thrombus 7/18. Pharmacy was consulted to start IV heparin. Given recent apixaban exposure, will monitor anticoagulation using aPTT until aPTT and heparin levels correlate.  APTT with big swings with small dose changes  IV line checked by R and ok Heparin running in PIV and labs being drawn from PICC, CBC stable But slight hematuria and blood in stool  - watch  Heparin level remains  > 1.1  (due to Eliquis).  Goal of Therapy:  Heparin level 0.3-0.7 units/ml aPTT 66-102 seconds Monitor platelets by anticoagulation protocol: Yes   Plan:  Will increase heparin infusion to 600 units/hr  Daily aptt CBC Restart Eliquis when able    8/18.D. CPP, BCPS Clinical Pharmacist [phone] 10/29/2020 4:16 PM   Please see amion for complete clinical pharmacist phone list 10/29/2020 4:16 PM

## 2020-10-29 NOTE — TOC CM/SW Note (Signed)
TOC CM spoke to pt's dtr, Tresa Endo. Will have 3n1 and Rollator delivered to room. Agreeable to [facility] and offered choice. [facility] will review referral for [facility]. Dtr plans to assist father in home with care post dc. Attending updated. Will continue to follow for dc needs. Isidoro Donning RN CCM, [facility] ED TOC CM [phone]

## 2020-10-29 NOTE — Progress Notes (Signed)
ANTICOAGULATION CONSULT NOTE - Follow Up Consult  Pharmacy Consult for heparin Indication: atrial fibrillation and atrial thrombus  Labs: Recent Labs    10/27/20 0455 10/27/20 1750 10/28/20 0214 10/28/20 1237 10/29/20 0244  HGB 12.1  --  11.5*  --  11.9*  HCT 36.2  --  34.2*  --  35.1*  PLT 348  --  248  --  247  APTT 57*   < > 108* 130* 44*  HEPARINUNFRC >1.10*  --  >1.10*  --  >1.10*  CREATININE 1.08*  --  0.98  --  0.91   < > = values in this interval not displayed.    Assessment: 75yo female now subtherapeutic on heparin after rate change with difficulty achieving and maintaining therapeutic rate; no gtt issues or signs of bleeding per RN.  Goal of Therapy:  aPTT 66-102 seconds   Plan:  Will increase heparin gtt by 1 unit/kg/hr to 500 units/hr and check PTT in 8 hours.    Vernard Gambles, PharmD, BCPS  10/29/2020,4:03 AM

## 2020-10-29 NOTE — Progress Notes (Addendum)
Advanced Heart Failure Rounding Note  PCP-Cardiologist: Charlton Haws, MD  AHF: Dr. Gala Romney   Patient Profile   75 y/o woman with severe COPD, blindness. No known prior cardiac history. Poor baseline functional capacity over the past 2 years.  Admitted 6/22 with new onset AF. EF normal RV dilated. Cath with nonobstructive CAD. Sent home with [facility] and rate control w/ plans for outpatient DCCV after 3 weeks of a/c.   Readmitted 7/22 with acute HF. TEE EF 20% RV moderately down RA massively dilated. Heavy smoke in RA/LA and aortic root. DCCV canceled.    Developed shock with elevated LFTs. Moved to ICU.   PICC placed. Initial Co-ox 38%. CVP 12. Started on milrinone.   Subjective:    Remains in Afib 90s-low 100s. On Amio gtt at 30/hr  Milrinone increased yesterday for low co-ox. Improved today, 53>>67%    -2.8L yesterday in UOP. CVP 6-7   Scr stable 0.91.   Mild hematuria noted + hematochezia. Hgb stable at 11.9.   Objective:   Weight Range: 51.2 kg Body mass index is 25.31 kg/m.   Vital Signs:   Temp:  [97.3 F (36.3 C)-97.8 F (36.6 C)] 97.8 F (36.6 C) (07/22 0746) Pulse Rate:  [72-110] 96 (07/22 0600) Resp:  [13-71] 48 (07/22 0600) BP: (100-138)/(59-78) 126/69 (07/22 0600) SpO2:  [89 %-99 %] 96 % (07/22 0600) Weight:  [51.2 kg] 51.2 kg (07/22 0500) Last BM Date: 10/29/20  Weight change: Filed Weights   10/27/20 0500 10/28/20 0500 10/29/20 0500  Weight: 50.8 kg 50.2 kg 51.2 kg    Intake/Output:   Intake/Output Summary (Last 24 hours) at 10/29/2020 0748 Last data filed at 10/29/2020 0700 Gross per 24 hour  Intake 775.74 ml  Output 2825 ml  Net -2049.26 ml      Physical Exam   CVP 4 General:  thin elderly F. No respiratory difficulty HEENT: normal Neck: supple. JVD 7 cm. Carotids 2+ bilat; no bruits. No lymphadenopathy or thyromegaly appreciated. Cor: PMI nondisplaced.  Irregularly irregular  rhythm. No rubs, gallops or murmurs. Lungs:  clear Abdomen: soft, nontender, nondistended. No hepatosplenomegaly. No bruits or masses. Good bowel sounds. Extremities: no cyanosis, clubbing, rash, trace bilateral ankle edema, + bilateral hand edema  Neuro: alert & oriented x 3, cranial nerves grossly intact. moves all 4 extremities w/o difficulty. Affect pleasant.   Telemetry   Afib 90s-low 100s  Personally reviewed   EKG    No new EKG   Labs    CBC Recent Labs    10/27/20 0455 10/28/20 0214 10/29/20 0244  WBC 12.4* 9.8 11.9*  NEUTROABS 10.5*  --   --   HGB 12.1 11.5* 11.9*  HCT 36.2 34.2* 35.1*  MCV 96.0 96.1 95.9  PLT 348 248 247   Basic Metabolic Panel Recent Labs    52/84/13 0455 10/28/20 0214 10/29/20 0244  NA 131* 133* 131*  K 3.2* 3.5 4.5  CL 93* 94* 91*  CO2 26 30 31   GLUCOSE 254* 160* 138*  BUN 50* 38* 31*  CREATININE 1.08* 0.98 0.91  CALCIUM 7.9* 7.8* 8.4*  MG 2.2 2.0  --    Liver Function Tests Recent Labs    10/27/20 0455 10/28/20 0214  AST 387* 302*  ALT 376* 332*  ALKPHOS 247* 246*  BILITOT 1.2 0.9  PROT 5.6* 5.4*  ALBUMIN 2.1* 2.0*   No results for input(s): LIPASE, AMYLASE in the last 72 hours. Cardiac Enzymes No results for input(s): CKTOTAL, CKMB, CKMBINDEX, TROPONINI in  the last 72 hours.  BNP: BNP (last 3 results) Recent Labs    10/25/20 0641 10/26/20 0639 10/27/20 0455  BNP 1,137.5* 990.2* 1,027.1*    ProBNP (last 3 results) No results for input(s): PROBNP in the last 8760 hours.   D-Dimer No results for input(s): DDIMER in the last 72 hours. Hemoglobin A1C No results for input(s): HGBA1C in the last 72 hours. Fasting Lipid Panel No results for input(s): CHOL, HDL, LDLCALC, TRIG, CHOLHDL, LDLDIRECT in the last 72 hours. Thyroid Function Tests No results for input(s): TSH, T4TOTAL, T3FREE, THYROIDAB in the last 72 hours.  Invalid input(s): FREET3   Other results:   Imaging    No results found.   Medications:     Scheduled Medications:   atorvastatin  40 mg Oral Daily   Chlorhexidine Gluconate Cloth  6 each Topical Daily   citalopram  20 mg Oral QHS   feeding supplement  237 mL Oral TID BM   furosemide  40 mg Intravenous BID   multivitamin with minerals  1 tablet Oral Daily   nystatin  5 mL Oral QID   pantoprazole  40 mg Oral Daily   sodium chloride flush  10-40 mL Intracatheter Q12H   spironolactone  12.5 mg Oral Daily    Infusions:  amiodarone 30 mg/hr (10/29/20 0700)   cefTRIAXone (ROCEPHIN)  IV Stopped (10/28/20 1538)   heparin 500 Units/hr (10/29/20 0700)   milrinone 0.375 mcg/kg/min (10/29/20 0700)   norepinephrine (LEVOPHED) Adult infusion Stopped (10/27/20 1049)    PRN Medications: lidocaine (PF), melatonin, metoprolol tartrate, ondansetron (ZOFRAN) IV, polyethylene glycol     Assessment/Plan    Acute HFrEF/ Nonischemic cardiomyopathy - Echo on 7/18 revealed an EF of 25 to 30 percent, decreased from 55 to 60 percent on 10/06/2020 - Recent left heart catheterization on 10/07/2020 revealed mild 40% segmental RCA stenosis as well as 50 to 60% second diagonal branch stenosis - Suspect etiology of newly reduced EF is atrial fibrillation - DCCV planned for 7/18 deferred due to concern for LAA thrombus - Co-ox 67%. Continue milrinone 0.375 - CVP low, 4. Hold diuretics today  - Contiue Spiro 12.5 mg daily  - Off digoxin due to elevated level  - Beta blocker on hold due to shock  - Anticipate continuing milrinone through weekend and repeating TEE possibly Monday  2. Atrial fibrillation - Rates improved - Diltiazem started earlier this admission, but stopped due to finding of newly reduced EF. Initially on metoprolol for rate control, but stopped due to concern for shock  - Planned DCCV deferred due to concern for LAA thrombus - Continue amiodarone at 30/hr.  LFTs improving and suspect elevation is r/t congestive hepatopathy.  Will continue to monitor - Currently on heparin drip due to LAA thrombus and  cholecystitis workup; however, no planned surgical intervention due to negative HIDA scan.  Bridge to Eliquis    3. Cardiogenic shock - TEE on 7/18 with considerable "smoke" indicating low output with EF of 25 to 30 percent - Initial Co-ox 38%, started on Milrinone  - On milrinone at 0.375.  Co-ox improved 67%  - CVP 4. Hold diuretics today  - Monitor renal function, SCr stable    4. Left atrial appendage thrombus - Noted on TEE on 7/18 - Continue heparin drip with eventual transition to Eliquis   5. CAD - LHC as noted above: 40% segmental mid RCA stenosis as well as 50 to 60% segmental second diagonal branch stenosis all of which can be treated  medically - Beta blocker on hold due to shock.  Statin on hold due to transaminitis   6. Transaminitis - Surgery and GI consulted -- both believe etiology is congestive hepatopathy due to heart failure - LFTs improving - HIDA negative for cholecystitis - Started on Unasyn for cholecystitis; however, HIDA negative and WBC trending down. C/w ceftriaxone (last dose 7/22)    7. Pulmonary hypertension - Echo shows severely dilated atria - Optimize from volume standpoint - Can address medically once cardiogenic shock is stabilized    8. COPD - Not currently exacerbated - Flutter valve and incentive spirometry ordered   9. Right pleural effusion - s/p US-guided thoracentesis on 7/13 - CXR on 7/16 shows some reaccumulation - Currently denies SOB and is not hypoxic  - Repeat CXR 7/21 w/ small bilateral pleural effusions    10. Hematuria - mild gross hematuria noted in purwick canister  - on heparin gtt w/ stable hgb 11.9 - monitor closely   11. Hematochezia - hgb 11.9 - repeat CBC this afternoon - start IV PPI - monitor for recurrent bleeding - may need EGD +/- colonoscopy   Length of Stay: [address]. Sharol Harness, PA-C  10/29/2020, 7:48 AM  Advanced Heart Failure Team Pager [phone] (M-F; 7a - 5p)  Please contact [facility]  for night-coverage after hours (5p -7a ) and weekends on [email or web address]  Agree with above.  Remains on milrinone 0.375. Co-ox improved. Had rectal bleeding this am. Feels weak. Denies ab pain, SOB, orthopnea or PND. Remains in AF but rate controlled on IV amio. Continues on IV heparin.  General:  Weak appearing. No resp difficulty HEENT: normal Neck: supple. no JVD. Carotids 2+ bilat; no bruits. No lymphadenopathy or thryomegaly appreciated. Cor: PMI nondisplaced. Irregular rate & rhythm. No rubs, gallops or murmurs. Lungs: clear but markedly decreased throughout Abdomen: soft, nontender, nondistended. No hepatosplenomegaly. No bruits or masses. Good bowel sounds. Extremities: no cyanosis, clubbing, rash, edema Neuro: alert & orientedx3, cranial nerves grossly intact. moves all 4 extremities w/o difficulty. Affect pleasant  Co-oc and volume status much improved with milrinone support. Will continue for now. If co-ox remains high can try to back milrinone down to 0.25 tomorrow.   Now with apparent lower GI bleeding. Need to watch closely. Hgb currently stable. Start IV PPI  Plan repeat TEE next week to see if smoke has resolved and can perform DC-CV. Initial echo suggestive of underlying pulmonary HTN with markedly dilated RA so may take some effort to maintain NSR.   CRITICAL CARE Performed by: Arvilla Meres  Total critical care time: 35 minutes  Critical care time was exclusive of separately billable procedures and treating other patients.  Critical care was necessary to treat or prevent imminent or life-threatening deterioration.  Critical care was time spent personally by me (independent of midlevel providers or residents) on the following activities: development of treatment plan with patient and/or surrogate as well as nursing, discussions with consultants, evaluation of patient's response to treatment, examination of patient, obtaining history from patient or surrogate, ordering  and performing treatments and interventions, ordering and review of laboratory studies, ordering and review of radiographic studies, pulse oximetry and re-evaluation of patient's condition.  Arvilla Meres, MD  2:15 PM

## 2020-10-30 DIAGNOSIS — I5033 Acute on chronic diastolic (congestive) heart failure: Secondary | ICD-10-CM | POA: Diagnosis not present

## 2020-10-30 DIAGNOSIS — R570 Cardiogenic shock: Secondary | ICD-10-CM | POA: Diagnosis not present

## 2020-10-30 LAB — CBC
HCT: 32.1 % — ABNORMAL LOW (ref 36.0–46.0)
Hemoglobin: 10.8 g/dL — ABNORMAL LOW (ref 12.0–15.0)
MCH: 32.4 pg (ref 26.0–34.0)
MCHC: 33.6 g/dL (ref 30.0–36.0)
MCV: 96.4 fL (ref 80.0–100.0)
Platelets: 191 10*3/uL (ref 150–400)
RBC: 3.33 MIL/uL — ABNORMAL LOW (ref 3.87–5.11)
RDW: 15.9 % — ABNORMAL HIGH (ref 11.5–15.5)
WBC: 10 10*3/uL (ref 4.0–10.5)
nRBC: 0 % (ref 0.0–0.2)

## 2020-10-30 LAB — COOXEMETRY PANEL
Carboxyhemoglobin: 1.3 % (ref 0.5–1.5)
Methemoglobin: 0.8 % (ref 0.0–1.5)
O2 Saturation: 58.9 %
Total hemoglobin: 11.5 g/dL — ABNORMAL LOW (ref 12.0–16.0)

## 2020-10-30 LAB — BASIC METABOLIC PANEL
Anion gap: 8 (ref 5–15)
BUN: 24 mg/dL — ABNORMAL HIGH (ref 8–23)
CO2: 32 mmol/L (ref 22–32)
Calcium: 8.3 mg/dL — ABNORMAL LOW (ref 8.9–10.3)
Chloride: 88 mmol/L — ABNORMAL LOW (ref 98–111)
Creatinine, Ser: 0.77 mg/dL (ref 0.44–1.00)
GFR, Estimated: >60 mL/min (ref >60)
Glucose, Bld: 130 mg/dL — ABNORMAL HIGH (ref 70–99)
Potassium: 4 mmol/L (ref 3.5–5.1)
Sodium: 128 mmol/L — ABNORMAL LOW (ref 135–145)

## 2020-10-30 LAB — APTT: aPTT: 69 seconds — ABNORMAL HIGH (ref 24–36)

## 2020-10-30 LAB — HEPARIN LEVEL (UNFRACTIONATED): Heparin Unfractionated: 0.99 IU/mL — ABNORMAL HIGH (ref 0.30–0.70)

## 2020-10-30 MED ORDER — FUROSEMIDE 40 MG PO TABS
40.0000 mg | ORAL_TABLET | Freq: Every day | ORAL | Status: DC
  Administered 2020-10-30 – 2020-10-31 (×2): 40 mg via ORAL
  Filled 2020-10-30 (×2): qty 1

## 2020-10-30 NOTE — Progress Notes (Addendum)
Advanced Heart Failure Rounding Note  PCP-Cardiologist: Charlton Haws, MD  AHF: Dr. Gala Romney   Patient Profile   75 y/o woman with severe COPD, blindness. No known prior cardiac history. Poor baseline functional capacity over the past 2 years.  Admitted 6/22 with new onset AF. EF normal RV dilated. Cath with nonobstructive CAD. Sent home with [facility] and rate control w/ plans for outpatient DCCV after 3 weeks of a/c.   Readmitted 7/22 with acute HF. TEE EF 20% RV moderately down RA massively dilated. Heavy smoke in RA/LA and aortic root. DCCV canceled.    Developed shock with elevated LFTs. Moved to ICU.   PICC placed. Initial Co-ox 38%. Started on milrinone.   Subjective:    Remains on milrinone 0.375 and IV amio. Co-ox 59%  CVP 4-> 10  HEar rate had occasional spikes to 140 yesterday so amio increased to 60. HR now 70s.   Had mild hematochezia yesterday. No melena. HGb 12.1 -> 10.8. remains on Heparin. Large BM today without blood/melena.  Denies CP or SOB. Up in chair but remains very weak . Not eating well    Objective:   Weight Range: 46.9 kg Body mass index is 23.18 kg/m.   Vital Signs:   Temp:  [97.4 F (36.3 C)-97.6 F (36.4 C)] 97.4 F (36.3 C) (07/23 0300) Pulse Rate:  [73-128] 79 (07/23 0700) Resp:  [13-29] 15 (07/23 0700) BP: (81-127)/(59-79) 127/69 (07/23 0700) SpO2:  [95 %-99 %] 96 % (07/23 0700) Weight:  [46.9 kg] 46.9 kg (07/23 0500) Last BM Date: 10/29/20  Weight change: Filed Weights   10/28/20 0500 10/29/20 0500 10/30/20 0500  Weight: 50.2 kg 51.2 kg 46.9 kg    Intake/Output:   Intake/Output Summary (Last 24 hours) at 10/30/2020 0751 Last data filed at 10/30/2020 0630 Gross per 24 hour  Intake 2038.71 ml  Output 1152 ml  Net 886.71 ml       Physical Exam   CVP 10 General:  thin elderly frail. No respiratory difficulty HEENT: normal Neck: supple. JVP to jaw Carotids 2+ bilat; no bruits. No lymphadenopathy or thryomegaly  appreciated. Cor: PMI nondisplaced. Irregular  No rubs, gallops or murmurs. Lungs:  markedly decreased throughout Abdomen: soft, nontender, nondistended. No hepatosplenomegaly. No bruits or masses. Good bowel sounds. Extremities: no cyanosis, clubbing, rash, edema Neuro: alert & orientedx3, cranial nerves grossly intact. moves all 4 extremities w/o difficulty. Affect pleasant   Telemetry   Afib 70s  Personally reviewed    Labs    CBC Recent Labs    10/29/20 1214 10/30/20 0305  WBC 13.6* 10.0  HGB 12.1 10.8*  HCT 36.9 32.1*  MCV 97.4 96.4  PLT 248 191    Basic Metabolic Panel Recent Labs    03/50/09 0214 10/29/20 0244 10/30/20 0305  NA 133* 131* 128*  K 3.5 4.5 4.0  CL 94* 91* 88*  CO2 30 31 32  GLUCOSE 160* 138* 130*  BUN 38* 31* 24*  CREATININE 0.98 0.91 0.77  CALCIUM 7.8* 8.4* 8.3*  MG 2.0  --   --     Liver Function Tests Recent Labs    10/28/20 0214  AST 302*  ALT 332*  ALKPHOS 246*  BILITOT 0.9  PROT 5.4*  ALBUMIN 2.0*    No results for input(s): LIPASE, AMYLASE in the last 72 hours. Cardiac Enzymes No results for input(s): CKTOTAL, CKMB, CKMBINDEX, TROPONINI in the last 72 hours.  BNP: BNP (last 3 results) Recent Labs    10/25/20 [phone]  10/26/20 0639 10/27/20 0455  BNP 1,137.5* 990.2* 1,027.1*     ProBNP (last 3 results) No results for input(s): PROBNP in the last 8760 hours.   D-Dimer No results for input(s): DDIMER in the last 72 hours. Hemoglobin A1C No results for input(s): HGBA1C in the last 72 hours. Fasting Lipid Panel No results for input(s): CHOL, HDL, LDLCALC, TRIG, CHOLHDL, LDLDIRECT in the last 72 hours. Thyroid Function Tests No results for input(s): TSH, T4TOTAL, T3FREE, THYROIDAB in the last 72 hours.  Invalid input(s): FREET3   Other results:   Imaging    No results found.   Medications:     Scheduled Medications:  atorvastatin  40 mg Oral Daily   Chlorhexidine Gluconate Cloth  6 each Topical  Daily   citalopram  20 mg Oral QHS   feeding supplement  237 mL Oral TID BM   multivitamin with minerals  1 tablet Oral Daily   nystatin  5 mL Oral QID   pantoprazole (PROTONIX) IV  40 mg Intravenous Q12H   sodium chloride flush  10-40 mL Intracatheter Q12H   spironolactone  12.5 mg Oral Daily    Infusions:  amiodarone 60 mg/hr (10/30/20 0600)   heparin 600 Units/hr (10/30/20 0600)   milrinone 0.375 mcg/kg/min (10/30/20 0600)   norepinephrine (LEVOPHED) Adult infusion Stopped (10/27/20 1049)    PRN Medications: lidocaine (PF), melatonin, metoprolol tartrate, ondansetron (ZOFRAN) IV, polyethylene glycol     Assessment/Plan    Acute HFrEF/ Nonischemic cardiomyopathy - Echo on 7/18 revealed an EF of 25 to 30 percent, decreased from 55 to 60 percent on 10/06/2020 - LHC 10/07/2020 revealed mild 40% segmental RCA stenosis as well as 50 to 60% second diagonal branch stenosis - Suspect etiology of newly reduced EF is atrial fibrillation - DCCV planned for 7/18 deferred due to concern for LAA thrombus/severe smoke - Remains on milrinone 0.375. Co-ox 59% - CVP 10. Will start po lasix - Contiue Spiro 12.5 mg daily  - Off digoxin due to elevated level  - Beta blocker on hold due to shock  - Anticipate continuing milrinone through weekend then wean as tolerated next week.   2. Atrial fibrillation - Diltiazem started earlier this admission, but stopped due to finding of newly reduced EF. Initially on metoprolol for rate control, but stopped due to concern for shock  - Planned DCCV deferred due to concern for LAA thrombus - Decreased amiodarone to 30/hr.  Rates now controlled - Currently on heparin drip due to LAA thrombus/smoke. Had some hematochezia so did not switch to Eliquis yet.   - Once GI situation stable, switch to eliqquis - Plan repeat TEE early next week to see if smoke has resolved with treatment of shock and can perform DC-CV - otherwise focus on rate control.. Initial echo  suggestive of underlying pulmonary HTN with markedly dilated RA so may take some effort to maintain NSR.     3. Cardiogenic shock - TEE on 7/18 with considerable "smoke" indicating low output with EF of 25 to 30 percent - Initial Co-ox 38%, started on Milrinone  - On milrinone at 0.375.  Co-ox improved 59%  - Volume status ok    4. Left atrial appendage thrombus/heavy smoke - Noted on TEE on 7/18 - Continue heparin drip with eventual transition to Eliquis   5. CAD - LHC as noted above: 40% segmental mid RCA stenosis as well as 50 to 60% segmental second diagonal branch stenosis all of which can be treated medically - Beta blocker  on hold due to shock.  Statin on hold due to transaminitis   6. Transaminitis - Surgery and GI consulted -- both believe etiology is congestive hepatopathy due to heart failure - LFTs improving - HIDA negative for cholecystitis - Started on Unasyn for cholecystitis; however, HIDA negative and WBC trending down. C/w ceftriaxone (last dose 7/22)    7. Pulmonary hypertension - Echo shows severely dilated atria - Optimize from volume standpoint - Can address medically once cardiogenic shock is stabilized    8. COPD - Not currently exacerbated - Flutter valve and incentive spirometry ordered   9. Right pleural effusion - s/p US-guided thoracentesis on 7/13 - CXR on 7/16 shows some reaccumulation - Currently denies SOB and is not hypoxic  - Repeat CXR 7/21 w/ small bilateral pleural effusions    10. Hematuria - mild gross hematuria noted in purwick canister  - improved   11. Hematochezia - hgb 12.1 -> 10.8 - continue  IV PPI - monitor for recurrent bleeding. Large BM today without blood/melena - may need EGD +/- colonoscopy   12. Hyponatremia - free water restrict  13. Severe protein-calorie malnutrition - dietary consult  14. Physical deconditioning - PT/OT  15. GOC - she is very frail. - family discussions ongoing about code status and  GOC  CRITICAL CARE Performed by: Arvilla Meres  Total critical care time: 35 minutes  Critical care time was exclusive of separately billable procedures and treating other patients.  Critical care was necessary to treat or prevent imminent or life-threatening deterioration.  Critical care was time spent personally by me (independent of midlevel providers or residents) on the following activities: development of treatment plan with patient and/or surrogate as well as nursing, discussions with consultants, evaluation of patient's response to treatment, examination of patient, obtaining history from patient or surrogate, ordering and performing treatments and interventions, ordering and review of laboratory studies, ordering and review of radiographic studies, pulse oximetry and re-evaluation of patient's condition.   Length of Stay: 61  Arvilla Meres, MD  10/30/2020, 7:51 AM  Advanced Heart Failure Team Pager [phone] (M-F; 7a - 5p)  Please contact [facility] for night-coverage after hours (5p -7a ) and weekends on [email or web address]

## 2020-10-30 NOTE — Progress Notes (Addendum)
ANTICOAGULATION CONSULT NOTE  Pharmacy Consult for IV Heparin Indication: atrial fibrillation and suspected LAA thrombus 7/18  Allergies  Allergen Reactions   Codeine Nausea And Vomiting    Patient Measurements: Height: 4\' 8"  (142.2 cm) Weight: 46.9 kg (103 lb 6.3 oz) IBW/kg (Calculated) : 36.3 Heparin Dosing Weight: 46 kg  Vital Signs: Temp: 97 F (36.1 C) (07/23 1223) Temp Source: Axillary (07/23 1223) BP: 102/62 (07/23 1200) Pulse Rate: 82 (07/23 1200)  Labs: Recent Labs    10/28/20 0214 10/28/20 1237 10/29/20 0244 10/29/20 1214 10/30/20 0305  HGB 11.5*  --  11.9* 12.1 10.8*  HCT 34.2*  --  35.1* 36.9 32.1*  PLT 248  --  247 248 191  APTT 108*   < > 44* 47* 69*  HEPARINUNFRC >1.10*  --  >1.10*  --  0.99*  CREATININE 0.98  --  0.91  --  0.77   < > = values in this interval not displayed.   Estimated Creatinine Clearance: 38.8 mL/min (by C-G formula based on SCr of 0.77 mg/dL).   Medical History: Past Medical History:  Diagnosis Date   AMD (age related macular degeneration)    COPD (chronic obstructive pulmonary disease) (HCC)    Depression    Hiatal hernia    Intermittent low back pain    Memory impairment    Midsternal chest pain    a. 12/2011 Cardiac CTA Ca++ score of 103.3 (80th %), LAD <50p/m, RCA 50-75.   Osteoporosis    Vitamin B 12 deficiency     Assessment: 75 yr old female on apixaban PTA for atrial fibrillation (last dose 7/16 2100); apixaban on hold for elevated LFTs. DCCV not done due to possible LA appendage thrombus 7/18. Pharmacy was consulted to start IV heparin. Given recent apixaban exposure, will monitor anticoagulation using aPTT until aPTT and heparin levels correlate.  Heparin running in PIV and labs being drawn from PICC, CBC stable But slight hematuria and blood in stool  - watch, I s/w RN blood in stool is improved from 7/22 Hgb 11.9>>10.8 and Plts 248>> 191  aPTT 69 (therapeutic) currently.  HL still elevated and not  correlated at 0.99. Continue dosing based on aPTT for now.   Goal of Therapy:  Heparin level 0.3-0.7 units/ml aPTT 66-102 seconds Monitor platelets by anticoagulation protocol: Yes   Plan:  Continue heparin at 600 units/hr. Daily aptt CBC Restart Eliquis when able 8/22, PharmD  PGY2 Cardiology Pharmacy Resident  Phone: [phone]  10/30/2020  2:15 PM  Please check [email or web address] for unit-specific pharmacy phone numbers.

## 2020-10-31 DIAGNOSIS — I5033 Acute on chronic diastolic (congestive) heart failure: Secondary | ICD-10-CM | POA: Diagnosis not present

## 2020-10-31 LAB — BASIC METABOLIC PANEL
Anion gap: 8 (ref 5–15)
BUN: 20 mg/dL (ref 8–23)
CO2: 32 mmol/L (ref 22–32)
Calcium: 8 mg/dL — ABNORMAL LOW (ref 8.9–10.3)
Chloride: 88 mmol/L — ABNORMAL LOW (ref 98–111)
Creatinine, Ser: 0.78 mg/dL (ref 0.44–1.00)
GFR, Estimated: >60 mL/min (ref >60)
Glucose, Bld: 93 mg/dL (ref 70–99)
Potassium: 3.9 mmol/L (ref 3.5–5.1)
Sodium: 128 mmol/L — ABNORMAL LOW (ref 135–145)

## 2020-10-31 LAB — CBC
HCT: 30.4 % — ABNORMAL LOW (ref 36.0–46.0)
Hemoglobin: 10.5 g/dL — ABNORMAL LOW (ref 12.0–15.0)
MCH: 32.8 pg (ref 26.0–34.0)
MCHC: 34.5 g/dL (ref 30.0–36.0)
MCV: 95 fL (ref 80.0–100.0)
Platelets: 169 10*3/uL (ref 150–400)
RBC: 3.2 MIL/uL — ABNORMAL LOW (ref 3.87–5.11)
RDW: 15.7 % — ABNORMAL HIGH (ref 11.5–15.5)
WBC: 8.9 10*3/uL (ref 4.0–10.5)
nRBC: 0 % (ref 0.0–0.2)

## 2020-10-31 LAB — COOXEMETRY PANEL
Carboxyhemoglobin: 1.6 % — ABNORMAL HIGH (ref 0.5–1.5)
Methemoglobin: 1.1 % (ref 0.0–1.5)
O2 Saturation: 63.7 %
Total hemoglobin: 8.5 g/dL — ABNORMAL LOW (ref 12.0–16.0)

## 2020-10-31 LAB — APTT: aPTT: 65 seconds — ABNORMAL HIGH (ref 24–36)

## 2020-10-31 LAB — HEPARIN LEVEL (UNFRACTIONATED): Heparin Unfractionated: 0.48 IU/mL (ref 0.30–0.70)

## 2020-10-31 MED ORDER — APIXABAN 5 MG PO TABS
5.0000 mg | ORAL_TABLET | Freq: Two times a day (BID) | ORAL | Status: DC
Start: 2020-10-31 — End: 2020-11-04
  Administered 2020-10-31 – 2020-11-04 (×9): 5 mg via ORAL
  Filled 2020-10-31 (×9): qty 1

## 2020-10-31 MED ORDER — DIGOXIN 125 MCG PO TABS: 0.1250 mg | ORAL_TABLET | Freq: Every day | ORAL | Status: DC

## 2020-10-31 MED ORDER — FUROSEMIDE 10 MG/ML IJ SOLN
60.0000 mg | Freq: Two times a day (BID) | INTRAMUSCULAR | Status: DC
  Administered 2020-10-31: 60 mg via INTRAVENOUS
  Filled 2020-10-31: qty 6

## 2020-10-31 MED ORDER — DAPAGLIFLOZIN PROPANEDIOL 10 MG PO TABS
10.0000 mg | ORAL_TABLET | Freq: Every day | ORAL | Status: DC
  Administered 2020-10-31 – 2020-11-04 (×5): 10 mg via ORAL
  Filled 2020-10-31 (×5): qty 1

## 2020-10-31 MED ORDER — DIGOXIN 125 MCG PO TABS
0.0625 mg | ORAL_TABLET | Freq: Every day | ORAL | Status: DC
  Administered 2020-10-31 – 2020-11-04 (×4): 0.0625 mg via ORAL
  Filled 2020-10-31 (×5): qty 1

## 2020-10-31 NOTE — Progress Notes (Signed)
RN talked with the pt's Son regarding visitation policy overnight and described the extenuating circumstances only they are allowed. He talked with his sister over the phone and informed about the conversation. He showed understanding no need to stay overnight this time. Will continue to monitor  Lonia Farber, RN

## 2020-10-31 NOTE — Progress Notes (Signed)
Pt sat in a recliner for 3-4 hours, ambulated in a hallway with the help of RN almost 100 ft, tolerated pretty well, denies SOB, chest pain and distress, will continue to monitor  Lonia Farber, RN

## 2020-10-31 NOTE — Progress Notes (Signed)
ANTICOAGULATION CONSULT NOTE  Pharmacy Consult for IV Heparin Indication: atrial fibrillation and suspected LAA thrombus 7/18  Allergies  Allergen Reactions   Codeine Nausea And Vomiting    Patient Measurements: Height: 4\' 8"  (142.2 cm) Weight: 47 kg (103 lb 9.9 oz) IBW/kg (Calculated) : 36.3 Heparin Dosing Weight: 46 kg  Vital Signs: Temp: 97.9 F (36.6 C) (07/24 0724) Temp Source: Axillary (07/24 0724) BP: 104/64 (07/24 0724) Pulse Rate: 74 (07/24 0724)  Labs: Recent Labs    10/29/20 0244 10/29/20 1214 10/30/20 0305 10/31/20 0420  HGB 11.9* 12.1 10.8* 10.5*  HCT 35.1* 36.9 32.1* 30.4*  PLT 247 248 191 169  APTT 44* 47* 69* 65*  HEPARINUNFRC >1.10*  --  0.99* 0.48  CREATININE 0.91  --  0.77 0.78   Estimated Creatinine Clearance: 38.9 mL/min (by C-G formula based on SCr of 0.78 mg/dL).   Medical History: Past Medical History:  Diagnosis Date   AMD (age related macular degeneration)    COPD (chronic obstructive pulmonary disease) (HCC)    Depression    Hiatal hernia    Intermittent low back pain    Memory impairment    Midsternal chest pain    a. 12/2011 Cardiac CTA Ca++ score of 103.3 (80th %), LAD <50p/m, RCA 50-75.   Osteoporosis    Vitamin B 12 deficiency     Assessment: 76 yr old female on apixaban PTA for atrial fibrillation (last dose 7/16 2100); apixaban on hold for elevated LFTs. DCCV not done due to possible LA appendage thrombus 7/18. Pharmacy was consulted to start IV heparin. Given recent apixaban exposure, will monitor anticoagulation using aPTT until aPTT and heparin levels correlate.  aPTT 65, just below goal, heparin level 0.48 not quite correlated but should be by the morning. Hgb stable in 10s. Will make small rate adjustment and follow up in the morning.   Goal of Therapy:  Heparin level 0.3-0.7 units/ml aPTT 66-102 seconds Monitor platelets by anticoagulation protocol: Yes   Plan:  Increase heparin to 650 units/hr. Daily aptt, CBC,  and heparin level Restart Eliquis when able  8/18 PharmD., BCPS Clinical Pharmacist 10/31/2020 8:42 AM

## 2020-10-31 NOTE — Progress Notes (Signed)
Mobility Specialist: Progress Note   10/31/20 1725  Mobility  Activity Ambulated in room  Level of Assistance Contact guard assist, steadying assist  Assistive Device Front wheel walker  Distance Ambulated (ft) 72 ft  Mobility Ambulated with assistance in room  Mobility Response Tolerated well  Mobility performed by Mobility specialist  $Mobility charge 1 Mobility   Pre-Mobility: 73 HR, 96% SpO2 Post-Mobility: 73 HR, 93% SpO2  Pt independent with bed mobility as well as to stand. Upon standing pt felt like she was having a BM so pt sat on BSC, BM unsuccessful. Pt agreeable to ambulate in the room and then went back to bed. Pt has call bell at her side and RN present in the room.   [facility] [place] Mobility Specialist Mobility Specialist Phone: [phone]

## 2020-10-31 NOTE — Progress Notes (Signed)
Patient ID: Laurie James, female   DOB: 1945/08/13, 75 y.o.   MRN: [id number]     Advanced Heart Failure Rounding Note  PCP-Cardiologist: Charlton Haws, MD  AHF: Dr. Gala Romney   Patient Profile   75 y/o woman with severe COPD, blindness. No known prior cardiac history. Poor baseline functional capacity over the past 2 years.  Admitted 6/22 with new onset AF. EF normal RV dilated. Cath with nonobstructive CAD. Sent home with [facility] and rate control w/ plans for outpatient DCCV after 3 weeks of a/c.   Readmitted 7/22 with acute HF. TEE EF 20% RV moderately down RA massively dilated. Heavy smoke in RA/LA and aortic root. DCCV canceled.    Developed shock with elevated LFTs. Moved to ICU.   PICC placed. Initial Co-ox 38%. Started on milrinone.   Subjective:    Remains on milrinone 0.375 and IV amio. Co-ox 64%,  CVP 11 today.  HR 80s-90s, atrial fibrillation. I/Os even.   BM yesterday, no BRBPR/melena. Hgb stable at 10.5, remains on heparin gtt.   Denies dyspnea.    Objective:   Weight Range: 47 kg Body mass index is 23.23 kg/m.   Vital Signs:   Temp:  [97 F (36.1 C)-98.5 F (36.9 C)] 97.9 F (36.6 C) (07/24 0724) Pulse Rate:  [74-99] 74 (07/24 0724) Resp:  [15-29] 16 (07/24 0724) BP: (101-131)/(54-70) 104/64 (07/24 0724) SpO2:  [92 %-100 %] 94 % (07/24 0724) Weight:  [47 kg] 47 kg (07/24 0500) Last BM Date: 10/30/20  Weight change: Filed Weights   10/30/20 2330 10/31/20 0008 10/31/20 0500  Weight: 47 kg 47 kg 47 kg    Intake/Output:   Intake/Output Summary (Last 24 hours) at 10/31/2020 0951 Last data filed at 10/31/2020 0400 Gross per 24 hour  Intake 1133.19 ml  Output 1025 ml  Net 108.19 ml      Physical Exam   CVP 11 General: NAD, frail.  Neck: JVP 12 cm, no thyromegaly or thyroid nodule.  Lungs: Clear to auscultation bilaterally with normal respiratory effort. CV: Nondisplaced PMI.  Heart irregular S1/S2, no S3/S4, no murmur.  1+ edema to  knees.  Abdomen: Soft, nontender, no hepatosplenomegaly, no distention.  Skin: Intact without lesions or rashes.  Neurologic: Alert and oriented x 3.  Psych: Normal affect. Extremities: No clubbing or cyanosis.  HEENT: Normal.    Telemetry   Afib 80s  Personally reviewed    Labs    CBC Recent Labs    10/30/20 0305 10/31/20 0420  WBC 10.0 8.9  HGB 10.8* 10.5*  HCT 32.1* 30.4*  MCV 96.4 95.0  PLT 191 169   Basic Metabolic Panel Recent Labs    53/29/92 0305 10/31/20 0420  NA 128* 128*  K 4.0 3.9  CL 88* 88*  CO2 32 32  GLUCOSE 130* 93  BUN 24* 20  CREATININE 0.77 0.78  CALCIUM 8.3* 8.0*   Liver Function Tests No results for input(s): AST, ALT, ALKPHOS, BILITOT, PROT, ALBUMIN in the last 72 hours. No results for input(s): LIPASE, AMYLASE in the last 72 hours. Cardiac Enzymes No results for input(s): CKTOTAL, CKMB, CKMBINDEX, TROPONINI in the last 72 hours.  BNP: BNP (last 3 results) Recent Labs    10/25/20 0641 10/26/20 0639 10/27/20 0455  BNP 1,137.5* 990.2* 1,027.1*    ProBNP (last 3 results) No results for input(s): PROBNP in the last 8760 hours.   D-Dimer No results for input(s): DDIMER in the last 72 hours. Hemoglobin A1C No results for input(s):  HGBA1C in the last 72 hours. Fasting Lipid Panel No results for input(s): CHOL, HDL, LDLCALC, TRIG, CHOLHDL, LDLDIRECT in the last 72 hours. Thyroid Function Tests No results for input(s): TSH, T4TOTAL, T3FREE, THYROIDAB in the last 72 hours.  Invalid input(s): FREET3   Other results:   Imaging    No results found.   Medications:     Scheduled Medications:  atorvastatin  40 mg Oral Daily   Chlorhexidine Gluconate Cloth  6 each Topical Daily   citalopram  20 mg Oral QHS   dapagliflozin propanediol  10 mg Oral Daily   digoxin  0.125 mg Oral Daily   feeding supplement  237 mL Oral TID BM   furosemide  60 mg Intravenous BID   multivitamin with minerals  1 tablet Oral Daily   nystatin   5 mL Oral QID   pantoprazole (PROTONIX) IV  40 mg Intravenous Q12H   sodium chloride flush  10-40 mL Intracatheter Q12H   spironolactone  12.5 mg Oral Daily    Infusions:  amiodarone 30 mg/hr (10/31/20 0400)   milrinone 0.375 mcg/kg/min (10/31/20 0414)    PRN Medications: lidocaine (PF), melatonin, metoprolol tartrate, ondansetron (ZOFRAN) IV, polyethylene glycol     Assessment/Plan    Acute HFrEF/ Nonischemic cardiomyopathy - Echo on 7/18 revealed an EF of 25 to 30 percent, decreased from 55 to 60 percent on 10/06/2020 - LHC 10/07/2020 revealed mild 40% segmental RCA stenosis as well as 50 to 60% second diagonal branch stenosis - Suspect etiology of newly reduced EF is atrial fibrillation - DCCV planned for 7/18 deferred due to concern for LAA thrombus/severe smoke - Remains on milrinone 0.375. Co-ox 64%.  Decrease milrinone to 0.25.  - CVP 11-12, Lasix 60 mg IV bid today.  - Continue Spiro 12.5 mg daily  - Off digoxin due to elevated level, will restart at lower level 0.0625 mg daily.  - Beta blocker on hold due to shock  - Add dapagliflozin 10 mg daily.   2. Atrial fibrillation - Diltiazem started earlier this admission, but stopped due to finding of newly reduced EF. Initially on metoprolol for rate control, but stopped due to concern for shock  - Planned DCCV deferred due to concern for LAA thrombus - Continue amiodarone 30/hr.  Rates now controlled - Currently on heparin drip due to LAA thrombus/smoke. Had some hematochezia so did not switch to Eliquis yet.   - No further BRBPR/melena, will stop heparin gtt and start Eliquis today.  - Plan repeat TEE later this week to see if smoke has resolved with treatment of shock and can perform DC-CV - otherwise focus on rate control.. Initial echo suggestive of underlying pulmonary HTN with markedly dilated RA so may take some effort to maintain NSR.  Will try to get milrinone weaned down some before TEE/DCCV attempt.   3.  Cardiogenic shock - TEE on 7/18 with considerable "smoke" indicating low output with EF of 25 to 30 percent - Initial Co-ox 38%, started on Milrinone  - On milrinone at 0.375.  Co-ox improved 64%.  As above, decrease milrinone to 0.25.    4. Left atrial appendage thrombus/heavy smoke - Noted on TEE on 7/18 - Transition today to Eliquis.    5. CAD - LHC as noted above: 40% segmental mid RCA stenosis as well as 50 to 60% segmental second diagonal branch stenosis all of which can be treated medically - Beta blocker on hold due to shock.  Statin on hold due to transaminitis  6. Transaminitis - Surgery and GI consulted -- both believe etiology is congestive hepatopathy due to heart failure - LFTs improving, recheck in am.  - HIDA negative for cholecystitis - Started on Unasyn for cholecystitis; however, HIDA negative and WBC trending down. C/w ceftriaxone (last dose 7/22)    7. Pulmonary hypertension - Echo shows severely dilated atria - Optimize from volume standpoint - Can address medically once cardiogenic shock is stabilized    8. COPD - Not currently exacerbated - Flutter valve and incentive spirometry ordered   9. Right pleural effusion - s/p US-guided thoracentesis on 7/13 - CXR on 7/16 shows some reaccumulation - Currently denies SOB and is not hypoxic  - Repeat CXR 7/21 w/ small bilateral pleural effusions    10. Hematuria - mild gross hematuria noted in purwick canister  - improved  11. Hematochezia - hgb 12.1 -> 10.8 -> 10.5 - continue  IV PPI - monitor for recurrent bleeding, no further overnight.  - Given no further hematochezia, will stop heparin gtt and start Eliquis.   12. Hyponatremia - free water restrict  13. Severe protein-calorie malnutrition - dietary consult  14. Physical deconditioning - PT/OT  15. GOC - she is very frail. - family discussions ongoing about code status and GOC   Length of Stay: 37  Marca Ancona, MD  10/31/2020, 9:51  AM  Advanced Heart Failure Team Pager [phone] (M-F; 7a - 5p)  Please contact [facility] for night-coverage after hours (5p -7a ) and weekends on [email or web address]

## 2020-11-01 ENCOUNTER — Other Ambulatory Visit (HOSPITAL_COMMUNITY): Payer: Self-pay

## 2020-11-01 DIAGNOSIS — I5043 Acute on chronic combined systolic (congestive) and diastolic (congestive) heart failure: Secondary | ICD-10-CM | POA: Diagnosis not present

## 2020-11-01 LAB — CBC
HCT: 32.1 % — ABNORMAL LOW (ref 36.0–46.0)
Hemoglobin: 11 g/dL — ABNORMAL LOW (ref 12.0–15.0)
MCH: 32.5 pg (ref 26.0–34.0)
MCHC: 34.3 g/dL (ref 30.0–36.0)
MCV: 95 fL (ref 80.0–100.0)
Platelets: 160 10*3/uL (ref 150–400)
RBC: 3.38 MIL/uL — ABNORMAL LOW (ref 3.87–5.11)
RDW: 15.7 % — ABNORMAL HIGH (ref 11.5–15.5)
WBC: 8.4 10*3/uL (ref 4.0–10.5)
nRBC: 0 % (ref 0.0–0.2)

## 2020-11-01 LAB — COMPREHENSIVE METABOLIC PANEL
ALT: 207 U/L — ABNORMAL HIGH (ref 0–44)
AST: 129 U/L — ABNORMAL HIGH (ref 15–41)
Albumin: 2.1 g/dL — ABNORMAL LOW (ref 3.5–5.0)
Alkaline Phosphatase: 204 U/L — ABNORMAL HIGH (ref 38–126)
Anion gap: 7 (ref 5–15)
BUN: 21 mg/dL (ref 8–23)
CO2: 36 mmol/L — ABNORMAL HIGH (ref 22–32)
Calcium: 8.4 mg/dL — ABNORMAL LOW (ref 8.9–10.3)
Chloride: 88 mmol/L — ABNORMAL LOW (ref 98–111)
Creatinine, Ser: 0.86 mg/dL (ref 0.44–1.00)
GFR, Estimated: >60 mL/min (ref >60)
Glucose, Bld: 105 mg/dL — ABNORMAL HIGH (ref 70–99)
Potassium: 3.7 mmol/L (ref 3.5–5.1)
Sodium: 131 mmol/L — ABNORMAL LOW (ref 135–145)
Total Bilirubin: 0.9 mg/dL (ref 0.3–1.2)
Total Protein: 5.6 g/dL — ABNORMAL LOW (ref 6.5–8.1)

## 2020-11-01 LAB — COOXEMETRY PANEL
Carboxyhemoglobin: 1.2 % (ref 0.5–1.5)
Methemoglobin: 0.7 % (ref 0.0–1.5)
O2 Saturation: 59.4 %
Total hemoglobin: 13.7 g/dL (ref 12.0–16.0)

## 2020-11-01 LAB — PROTIME-INR
INR: 1.5 — ABNORMAL HIGH (ref 0.8–1.2)
Prothrombin Time: 18.3 seconds — ABNORMAL HIGH (ref 11.4–15.2)

## 2020-11-01 MED ORDER — PANTOPRAZOLE SODIUM 40 MG PO TBEC
40.0000 mg | DELAYED_RELEASE_TABLET | Freq: Two times a day (BID) | ORAL | Status: DC
  Administered 2020-11-01 – 2020-11-04 (×6): 40 mg via ORAL
  Filled 2020-11-01 (×6): qty 1

## 2020-11-01 MED ORDER — SODIUM CHLORIDE 0.9 % IV SOLN: INTRAVENOUS | Status: DC

## 2020-11-01 MED ORDER — POTASSIUM CHLORIDE CRYS ER 20 MEQ PO TBCR
40.0000 meq | EXTENDED_RELEASE_TABLET | Freq: Once | ORAL | Status: AC
  Administered 2020-11-01: 40 meq via ORAL
  Filled 2020-11-01: qty 2

## 2020-11-01 MED ORDER — SPIRONOLACTONE 25 MG PO TABS
25.0000 mg | ORAL_TABLET | Freq: Every day | ORAL | Status: DC
  Administered 2020-11-01 – 2020-11-04 (×4): 25 mg via ORAL
  Filled 2020-11-01 (×4): qty 1

## 2020-11-01 NOTE — Progress Notes (Signed)
[facility] Progress Note  Laurie James 75 y.o. 07/30/1945   Subjective: Upset about her hearing aids. Denies abdominal pain/N/V.  Objective: Vital signs: Vitals:   11/01/20 0324 11/01/20 0750  BP: (!) 95/51 (!) 115/55  Pulse: 67 68  Resp: 17 17  Temp: 97.6 F (36.4 C) (!) 97.5 F (36.4 C)  SpO2: 94% 92%    Physical Exam: Gen: elderly, thin, no acute distress  HEENT: anicteric sclera CV: RRR Chest: CTA B Abd: soft, nontender, nondistended, +BS Ext: no edema  Lab Results: Recent Labs    10/31/20 0420 11/01/20 0335  NA 128* 131*  K 3.9 3.7  CL 88* 88*  CO2 32 36*  GLUCOSE 93 105*  BUN 20 21  CREATININE 0.78 0.86  CALCIUM 8.0* 8.4*   Recent Labs    11/01/20 0335  AST 129*  ALT 207*  ALKPHOS 204*  BILITOT 0.9  PROT 5.6*  ALBUMIN 2.1*   Recent Labs    10/31/20 0420 11/01/20 0335  WBC 8.9 8.4  HGB 10.5* 11.0*  HCT 30.4* 32.1*  MCV 95.0 95.0  PLT 169 160      Assessment/Plan: Elevated LFTs likely due to congestive hepatopathy. Improving. No further GI workup needed at this time. Will sign off. Call if questions. F/U with GI prn.   Shirley Friar 11/01/2020, 9:03 AM  Questions please call [phone] Patient ID: Laurie James, female   DOB: 1946/01/11, 75 y.o.   MRN: [id number]

## 2020-11-01 NOTE — Care Management Important Message (Signed)
Important Message  Patient Details  Name: Laurie James MRN: [id number] Date of Birth: [birth year]   Medicare Important Message Given:  Yes     Dorena Bodo 11/01/2020, 4:17 PM

## 2020-11-01 NOTE — Progress Notes (Signed)
Occupational Therapy Treatment Patient Details Name: Laurie James MRN: [id number] DOB: 1946-01-04 Today's Date: 11/01/2020    History of present illness 75 y.o. F admitted 7/13 due to worsening SOB and increased weakness. Pt with pneumonia and bilateral pleural effusions, right greater than left. 7/18 TEE shows new cardiomyopathy. PMHx: newly diagnosed A. fib, COPD, hypertension, hyperlipidemia, chronic anxiety/depression, GERD, memory impairment.   OT comments  Pt declined UE exercise, but did participate in seated grooming and toileting activities with set up to min guard assist. VSS on RA.  Follow Up Recommendations  Home health OT;Supervision/Assistance - 24 hour    Equipment Recommendations  Other (comment) (RW)    Recommendations for Other Services      Precautions / Restrictions Precautions Precautions: Fall Precaution Comments: Limited vision       Mobility Bed Mobility               General bed mobility comments: in chair    Transfers Overall transfer level: Needs assistance Equipment used: 1 person hand held assist Transfers: Sit to/from Stand;Stand Pivot Transfers Sit to Stand: Min guard Stand pivot transfers: Min guard       General transfer comment: directional cues due to poor vision    Balance Overall balance assessment: Needs assistance   Sitting balance-Leahy Scale: Fair     Standing balance support: Single extremity supported Standing balance-Leahy Scale: Poor Standing balance comment: one hand support while performing pericare in standing                           ADL either performed or assessed with clinical judgement   ADL Overall ADL's : Needs assistance/impaired     Grooming: Wash/dry hands;Wash/dry face;Brushing hair;Sitting;Set up                   Toilet Transfer: Min guard;Stand-pivot;BSC   Toileting- Clothing Manipulation and Hygiene: Min guard;Sit to/from stand                Vision   Additional Comments: light perception   Perception     Praxis      Cognition Arousal/Alertness: Awake/alert Behavior During Therapy: Flat affect Overall Cognitive Status: Within Functional Limits for tasks assessed                                 General Comments: follows directional cues with mobility        Exercises     Shoulder Instructions       General Comments      Pertinent Vitals/ Pain       Pain Assessment: No/denies pain  Home Living                                          Prior Functioning/Environment              Frequency  Min 2X/week        Progress Toward Goals  OT Goals(current goals can now be found in the care plan section)  Progress towards OT goals: Progressing toward goals  Acute Rehab OT Goals Patient Stated Goal: home OT Goal Formulation: With patient Time For Goal Achievement: 11/04/20 Potential to Achieve Goals: Good  Plan Discharge plan remains appropriate;Frequency remains appropriate    Co-evaluation  AM-PAC OT "6 Clicks" Daily Activity     Outcome Measure   Help from another person eating meals?: A Little Help from another person taking care of personal grooming?: A Little Help from another person toileting, which includes using toliet, bedpan, or urinal?: A Little Help from another person bathing (including washing, rinsing, drying)?: A Lot Help from another person to put on and taking off regular upper body clothing?: A Little Help from another person to put on and taking off regular lower body clothing?: A Lot 6 Click Score: 16    End of Session    OT Visit Diagnosis: Unsteadiness on feet (R26.81);Other abnormalities of gait and mobility (R26.89);Muscle weakness (generalized) (M62.81)   Activity Tolerance Patient tolerated treatment well   Patient Left in chair;with call bell/phone within reach   Nurse Communication          Time:  9024-0973 OT Time Calculation (min): 17 min  Charges: OT General Charges $OT Visit: 1 Visit OT Treatments $Self Care/Home Management : 8-22 mins  Martie Round, OTR/L Acute Rehabilitation Services Pager: [phone] Office: [phone]    Laurie James 11/01/2020, 12:18 PM

## 2020-11-01 NOTE — Progress Notes (Signed)
  Scheduled TEE/DC-CV s 7/27 at 1200 per Dr Shirlee Latch.   Laurie James NP_C 9:05 AM

## 2020-11-01 NOTE — Progress Notes (Addendum)
Patient ID: Laurie James, female   DOB: 07/18/1945, 75 y.o.   MRN: [id number]     Advanced Heart Failure Rounding Note  PCP-Cardiologist: Charlton Haws, MD  AHF: Dr. Gala Romney   Patient Profile   75 y/o woman with severe COPD, blindness. No known prior cardiac history. Poor baseline functional capacity over the past 2 years.  Admitted 6/22 with new onset AF. EF normal RV dilated. Cath with nonobstructive CAD. Sent home with [facility] and rate control w/ plans for outpatient DCCV after 3 weeks of a/c.   Readmitted 7/22 with acute HF. TEE EF 20% RV moderately down RA massively dilated. Heavy smoke in RA/LA and aortic root. DCCV canceled.    Developed shock with elevated LFTs. Moved to ICU.   PICC placed. Initial Co-ox 38%. Started on milrinone.   Subjective:    Yesterday diuresed with IV lasix and milrinone cut back to 0.25 and IV amio. Negative 1.7 liters.   Co-ox 59%  Feels ok today.   Objective:   Weight Range: 47 kg Body mass index is 23.23 kg/m.   Vital Signs:   Temp:  [97.6 F (36.4 C)-98.3 F (36.8 C)] 97.6 F (36.4 C) (07/25 0324) Pulse Rate:  [67-76] 67 (07/25 0324) Resp:  [16-20] 17 (07/25 0324) BP: (95-121)/(51-71) 95/51 (07/25 0324) SpO2:  [94 %-100 %] 94 % (07/25 0324) Last BM Date: 10/30/20  Weight change: Filed Weights   10/30/20 2330 10/31/20 0008 10/31/20 0500  Weight: 47 kg 47 kg 47 kg    Intake/Output:   Intake/Output Summary (Last 24 hours) at 11/01/2020 0714 Last data filed at 11/01/2020 0324 Gross per 24 hour  Intake 1446.27 ml  Output 3150 ml  Net -1703.73 ml     CVP 4 personally checked.  Physical Exam  General:  In bed.  No resp difficulty HEENT: normal Neck: supple. no JVD. Carotids 2+ bilat; no bruits. No lymphadenopathy or thryomegaly appreciated. Cor: PMI nondisplaced. Irregular rate & rhythm. No rubs, gallops or murmurs. Lungs: clear Abdomen: soft, nontender, nondistended. No hepatosplenomegaly. No bruits or masses.  Good bowel sounds. Extremities: no cyanosis, clubbing, rash, edema. RUE PICC  Neuro: alert & orientedx3, cranial nerves grossly intact. moves all 4 extremities w/o difficulty. Affect pleasant   Telemetry   A fib 60-70s    Labs    CBC Recent Labs    10/31/20 0420 11/01/20 0335  WBC 8.9 8.4  HGB 10.5* 11.0*  HCT 30.4* 32.1*  MCV 95.0 95.0  PLT 169 160   Basic Metabolic Panel Recent Labs    50/27/74 0420 11/01/20 0335  NA 128* 131*  K 3.9 3.7  CL 88* 88*  CO2 32 36*  GLUCOSE 93 105*  BUN 20 21  CREATININE 0.78 0.86  CALCIUM 8.0* 8.4*   Liver Function Tests Recent Labs    11/01/20 0335  AST 129*  ALT 207*  ALKPHOS 204*  BILITOT 0.9  PROT 5.6*  ALBUMIN 2.1*   No results for input(s): LIPASE, AMYLASE in the last 72 hours. Cardiac Enzymes No results for input(s): CKTOTAL, CKMB, CKMBINDEX, TROPONINI in the last 72 hours.  BNP: BNP (last 3 results) Recent Labs    10/25/20 0641 10/26/20 0639 10/27/20 0455  BNP 1,137.5* 990.2* 1,027.1*    ProBNP (last 3 results) No results for input(s): PROBNP in the last 8760 hours.   D-Dimer No results for input(s): DDIMER in the last 72 hours. Hemoglobin A1C No results for input(s): HGBA1C in the last 72 hours. Fasting Lipid Panel  No results for input(s): CHOL, HDL, LDLCALC, TRIG, CHOLHDL, LDLDIRECT in the last 72 hours. Thyroid Function Tests No results for input(s): TSH, T4TOTAL, T3FREE, THYROIDAB in the last 72 hours.  Invalid input(s): FREET3   Other results:   Imaging    No results found.   Medications:     Scheduled Medications:  apixaban  5 mg Oral BID   atorvastatin  40 mg Oral Daily   Chlorhexidine Gluconate Cloth  6 each Topical Daily   citalopram  20 mg Oral QHS   dapagliflozin propanediol  10 mg Oral Daily   digoxin  0.0625 mg Oral Daily   feeding supplement  237 mL Oral TID BM   furosemide  60 mg Intravenous BID   multivitamin with minerals  1 tablet Oral Daily   nystatin  5 mL  Oral QID   pantoprazole (PROTONIX) IV  40 mg Intravenous Q12H   sodium chloride flush  10-40 mL Intracatheter Q12H   spironolactone  12.5 mg Oral Daily    Infusions:  amiodarone 30 mg/hr (11/01/20 0324)   milrinone 0.25 mcg/kg/min (11/01/20 0325)    PRN Medications: lidocaine (PF), melatonin, metoprolol tartrate, ondansetron (ZOFRAN) IV, polyethylene glycol     Assessment/Plan    Acute HFrEF/ Nonischemic cardiomyopathy - Echo on 7/18 revealed an EF of 25 to 30 percent, decreased from 55 to 60 percent on 10/06/2020 - LHC 10/07/2020 revealed mild 40% segmental RCA stenosis as well as 50 to 60% second diagonal branch stenosis - Suspect etiology of newly reduced EF is atrial fibrillation - DCCV planned for 7/18 deferred due to concern for LAA thrombus/severe smoke - Remains on milrinone 0.25. Co-ox 59%  Cut back milrinone 0.125 mcg.  - CVP 4. Stop IV lasix.  Renal function stable. Tomorrow can start lasix 40 mg daily.  - No room for entresto with soft BP.  - Increase spiro to 25 mg daily.   - Off digoxin due to elevated level--> lower dose was restarted 0.0625 mg daily.  - Beta blocker on hold due to shock  - Continue dapagliflozin 10 mg daily.   2. Atrial fibrillation - Diltiazem started earlier this admission, but stopped due to finding of newly reduced EF. Initially on metoprolol for rate control, but stopped due to concern for shock  - Planned DCCV deferred due to concern for LAA thrombus - Continue amiodarone 30/hr.  Controlled rate.  - She is on Eliquis with LAA thrombus/smoke. No further hematochezia.   - No further BRBPR/melena, heparin stopped and eliquis started.  - Plan repeat TEE later this week to see if smoke has resolved with treatment of shock and can perform DC-CV - otherwise focus on rate control.. Initial echo suggestive of underlying pulmonary HTN with markedly dilated RA so may take some effort to maintain NSR.  Will try to get milrinone weaned down some before  TEE/DCCV attempt.  - Set up Wed?  3. Cardiogenic shock - TEE on 7/18 with considerable "smoke" indicating low output with EF of 25 to 30 percent - Initial Co-ox 38%, started on Milrinone  - Wean milirnone to 0.125 mcg as above.     4. Left atrial appendage thrombus/heavy smoke - Noted on TEE on 7/18 - Now on eliquis.     5. CAD - LHC as noted above: 40% segmental mid RCA stenosis as well as 50 to 60% segmental second diagonal branch stenosis all of which can be treated medically - Beta blocker on hold due to shock.  Statin on hold  due to transaminitis   6. Transaminitis - Surgery and GI consulted -- both believe etiology is congestive hepatopathy due to heart failure - LFTs continue to trend down.   - HIDA negative for cholecystitis - Started on Unasyn for cholecystitis; however, HIDA negative and WBC trending down. C/w ceftriaxone (last dose 7/22)    7. Pulmonary hypertension - Echo shows severely dilated atria - Optimize from volume standpoint - Can address medically once cardiogenic shock is stabilized    8. COPD - Not currently exacerbated - Flutter valve and incentive spirometry ordered   9. Right pleural effusion - s/p US-guided thoracentesis on 7/13 - CXR on 7/16 shows some reaccumulation - Currently denies SOB and is not hypoxic  - Repeat CXR 7/21 w/ small bilateral pleural effusions    10. Hematuria - mild gross hematuria in purewick. Now resolved.   11. Hematochezia - hgb 12.1 -> 10.8 -> 10.5->11 - continue  IV PPI - monitor for recurrent bleeding, no further overnight.  - Given no further hematochezia, she is back on Eliquis.   12. Hyponatremia - free water restrict  13. Severe protein-calorie malnutrition - dietary consult  14. Physical deconditioning - PT/OT  15. GOC - she is very frail. - family discussions ongoing about code status and GOC  Will order HH RN/PT. TOC consulted for meds--farxiga and eliquis. Due to blindness will need help with  medications. Down the road she will need pill packs.    Length of Stay: 13  Amy Filbert Schilder, NP  11/01/2020, 7:14 AM  Advanced Heart Failure Team Pager [phone] (M-F; 7a - 5p)  Please contact [facility] for night-coverage after hours (5p -7a ) and weekends on [email or web address]  Patient seen with NP, agree with the above note.   She remains in atrial fibrillation today on amiodarone gtt.  She is back on Eliquis with no hematochezia, hgb higher.    CVP down to 4, co-ox 59%.  Good diuresis with IV Lasix yesterday, remains on milrinone 0.25.   General: NAD. Frail.  Neck: No JVD, no thyromegaly or thyroid nodule.  Lungs: Clear to auscultation bilaterally with normal respiratory effort. CV: Nondisplaced PMI.  Heart irregular S1/S2, no S3/S4, no murmur.  No peripheral edema.   Abdomen: Soft, nontender, no hepatosplenomegaly, no distention.  Skin: Intact without lesions or rashes.  Neurologic: Alert and oriented x 3.  Psych: Normal affect. Extremities: No clubbing or cyanosis.  HEENT: Normal.   Decrease milrinone to 0.125 today.  Tomorrow, will start Lasix 40 mg daily.    Continue dapagliflozin and digoxin, increase spironolactone to 25 mg daily .  Once she is fully weaned off milrinone, will aim for TEE-guided DCCV (Wednesday).  Will repeat TEE to see if LA appendage thrombus has resolved.  She is on Eliquis.   Work with PT.   Marca Ancona 11/01/2020 9:04 AM

## 2020-11-02 DIAGNOSIS — I5043 Acute on chronic combined systolic (congestive) and diastolic (congestive) heart failure: Secondary | ICD-10-CM | POA: Diagnosis not present

## 2020-11-02 LAB — CBC
HCT: 32.6 % — ABNORMAL LOW (ref 36.0–46.0)
Hemoglobin: 10.8 g/dL — ABNORMAL LOW (ref 12.0–15.0)
MCH: 32 pg (ref 26.0–34.0)
MCHC: 33.1 g/dL (ref 30.0–36.0)
MCV: 96.4 fL (ref 80.0–100.0)
Platelets: 180 10*3/uL (ref 150–400)
RBC: 3.38 MIL/uL — ABNORMAL LOW (ref 3.87–5.11)
RDW: 15.6 % — ABNORMAL HIGH (ref 11.5–15.5)
WBC: 8 10*3/uL (ref 4.0–10.5)
nRBC: 0 % (ref 0.0–0.2)

## 2020-11-02 LAB — COMPREHENSIVE METABOLIC PANEL
ALT: 180 U/L — ABNORMAL HIGH (ref 0–44)
AST: 112 U/L — ABNORMAL HIGH (ref 15–41)
Albumin: 2.1 g/dL — ABNORMAL LOW (ref 3.5–5.0)
Alkaline Phosphatase: 186 U/L — ABNORMAL HIGH (ref 38–126)
Anion gap: 6 (ref 5–15)
BUN: 18 mg/dL (ref 8–23)
CO2: 34 mmol/L — ABNORMAL HIGH (ref 22–32)
Calcium: 8.3 mg/dL — ABNORMAL LOW (ref 8.9–10.3)
Chloride: 91 mmol/L — ABNORMAL LOW (ref 98–111)
Creatinine, Ser: 0.78 mg/dL (ref 0.44–1.00)
GFR, Estimated: >60 mL/min (ref >60)
Glucose, Bld: 86 mg/dL (ref 70–99)
Potassium: 4.4 mmol/L (ref 3.5–5.1)
Sodium: 131 mmol/L — ABNORMAL LOW (ref 135–145)
Total Bilirubin: 0.9 mg/dL (ref 0.3–1.2)
Total Protein: 5.5 g/dL — ABNORMAL LOW (ref 6.5–8.1)

## 2020-11-02 LAB — COOXEMETRY PANEL
Carboxyhemoglobin: 1.3 % (ref 0.5–1.5)
Methemoglobin: 0.8 % (ref 0.0–1.5)
O2 Saturation: 59.6 %
Total hemoglobin: 10.9 g/dL — ABNORMAL LOW (ref 12.0–16.0)

## 2020-11-02 NOTE — Progress Notes (Signed)
Physical Therapy Treatment Patient Details Name: Laurie James MRN: [id number] DOB: [birth year] Today's Date: 11/02/2020    History of Present Illness 75 y.o. F admitted 7/13 due to worsening SOB and increased weakness. Pt with pneumonia and bilateral pleural effusions, right greater than left. 7/18 TEE shows new cardiomyopathy. PMHx: newly diagnosed A. fib, COPD, hypertension, hyperlipidemia, chronic anxiety/depression, GERD, memory impairment.    PT Comments    Pt pleasant and demonstrating significantly improved mobility walking long hall distance and performing stairs today. Pt encouraged to be OOB for toileting and meals as well as walking with nursing.  HR 61-72 Spo2 97-100% on RA    Follow Up Recommendations  Home health PT;Supervision for mobility/OOB     Equipment Recommendations  Rolling walker with 5" wheels;3in1 (PT)    Recommendations for Other Services       Precautions / Restrictions Precautions Precautions: Fall Precaution Comments: Limited vision Restrictions Weight Bearing Restrictions: No    Mobility  Bed Mobility   Bed Mobility: Supine to Sit     Supine to sit: Supervision     General bed mobility comments: supervision for lines and verbal cues, no physical assist with bed flat    Transfers Overall transfer level: Needs assistance   Transfers: Sit to/from Stand Sit to Stand: Min guard         General transfer comment: cues for hand placement, pt with tendency to place hands on Rw. x 4 trials  Ambulation/Gait Ambulation/Gait assistance: Min assist Gait Distance (Feet): 250 Feet Assistive device: Rolling walker (2 wheeled) Gait Pattern/deviations: Step-through pattern;Decreased stride length;Trunk flexed   Gait velocity interpretation: 1.31 - 2.62 ft/sec, indicative of limited community ambulator General Gait Details: pt walked 10' then performed stairs and 250' with cues for posture, position in RW and directional cues. Min  assist to direct and control RW   Stairs Stairs: Yes Stairs assistance: Min assist Stair Management: Step to pattern;Sideways;Forwards;One rail Left Number of Stairs: 3 General stair comments: pt initially using single rail but unable to ascend steps forward without additional HHA. Pt then transitioned to side stepping with bil UE on rail with minguard to ascend/descend steps. Pt reports rail being installed at home   Wheelchair Mobility    Modified Rankin (Stroke Patients Only)       Balance Overall balance assessment: Needs assistance Sitting-balance support: No upper extremity supported;Feet supported Sitting balance-Leahy Scale: Fair     Standing balance support: Bilateral upper extremity supported Standing balance-Leahy Scale: Poor Standing balance comment: bil Ue support during standing and gait                            Cognition Arousal/Alertness: Awake/alert Behavior During Therapy: Flat affect Overall Cognitive Status: Within Functional Limits for tasks assessed                                        Exercises General Exercises - Lower Extremity Long Arc Quad: AROM;Both;15 reps;Seated Hip Flexion/Marching: AROM;Both;Seated;15 reps    General Comments        Pertinent Vitals/Pain Pain Assessment: No/denies pain    Home Living                      Prior Function            PT Goals (current goals can now be  found in the care plan section) Progress towards PT goals: Progressing toward goals    Frequency    Min 3X/week      PT Plan Current plan remains appropriate    Co-evaluation              AM-PAC PT "6 Clicks" Mobility   Outcome Measure  Help needed turning from your back to your side while in a flat bed without using bedrails?: A Little Help needed moving from lying on your back to sitting on the side of a flat bed without using bedrails?: A Little Help needed moving to and from a bed to a  chair (including a wheelchair)?: A Little Help needed standing up from a chair using your arms (e.g., wheelchair or bedside chair)?: A Little Help needed to walk in hospital room?: A Little Help needed climbing 3-5 steps with a railing? : A Little 6 Click Score: 18    End of Session Equipment Utilized During Treatment: Gait belt Activity Tolerance: Patient tolerated treatment well Patient left: in chair;with call bell/phone within reach Nurse Communication: Mobility status PT Visit Diagnosis: Muscle weakness (generalized) (M62.81);Other abnormalities of gait and mobility (R26.89);Difficulty in walking, not elsewhere classified (R26.2)     Time: 0930-1006 PT Time Calculation (min) (ACUTE ONLY): 36 min  Charges:  $Gait Training: 8-22 mins $Therapeutic Exercise: 8-22 mins                     Camren Henthorn P, PT Acute Rehabilitation Services Pager: [phone] Office: [phone]    Jonni Oelkers B Undra Trembath 11/02/2020, 10:07 AM

## 2020-11-02 NOTE — Progress Notes (Signed)
Mobility Specialist: Progress Note   11/02/20 1703  Mobility  Activity Refused mobility   Pt refused mobility stating she had already walked 273ft today as well as three stairs. Pt said she just got back to the bed and did not want to get up again. Will f/u as able.   [facility] Oseph Imburgia Mobility Specialist Mobility Specialist Phone: [phone]

## 2020-11-02 NOTE — Progress Notes (Signed)
.      Durable Medical Equipment  (From admission, onward)           Start     Ordered   11/02/20 1039  For home use only DME Hospital bed  Once       Question Answer Comment  Length of Need Lifetime   Patient has (list medical condition): CHF   The above medical condition requires: Patient requires the ability to reposition frequently   Head must be elevated greater than: 45 degrees   Bed type Semi-electric   Trapeze Bar Yes   Support Surface: Gel Overlay      11/02/20 1038   11/01/20 0735  Heart failure home health orders  (Heart failure home health orders / Face to face)  Once       Comments: Heart Failure Follow-up Care:  Verify follow-up appointments per Patient Discharge Instructions. Confirm transportation arranged. Reconcile home medications with discharge medication list. Remove discontinued medications from use. Assist patient/caregiver to manage medications using pill box. Reinforce low sodium food selection Assessments: Vital signs and oxygen saturation at each visit. Assess home environment for safety concerns, caregiver support and availability of low-sodium foods. Consult Child psychotherapist, PT/OT, Dietitian, and CNA based on assessments. Perform comprehensive cardiopulmonary assessment. Notify MD for any change in condition or weight gain of 3 pounds in one day or 5 pounds in one week with symptoms. Daily Weights and Symptom Monitoring: Ensure patient has access to scales. Teach patient/caregiver to weigh daily before breakfast and after voiding using same scale and record.    Teach patient/caregiver to track weight and symptoms and when to notify Provider. Activity: Develop individualized activity plan with patient/caregiver.   HHRN HHPT  Question Answer Comment  Heart Failure Follow-up Care [facility] ([facility]) [facility] at [phone]   Obtain the following labs Basic Metabolic Panel   Obtain the following labs Other see comments   Lab frequency Other  see comments   Fax lab results to AHF Clinic at [phone]   Diet Low Sodium Heart Healthy   Fluid restrictions: 2000 mL Fluid      11/01/20 0735   10/29/20 1038  For home use only DME 4 wheeled rolling walker with seat  Once       Question:  Patient needs a walker to treat with the following condition  Answer:  CHF (congestive heart failure) (HCC)   10/29/20 1038   10/29/20 1038  For home use only DME 3 n 1  Once        10/29/20 1038

## 2020-11-02 NOTE — Progress Notes (Addendum)
Patient ID: Laurie James, female   DOB: 02-20-1946, 75 y.o.   MRN: [id number]     Advanced Heart Failure Rounding Note  PCP-Cardiologist: Charlton Haws, MD  AHF: Dr. Gala Romney   Patient Profile   75 y/o woman with severe COPD, blindness. No known prior cardiac history. Poor baseline functional capacity over the past 2 years.  Admitted 6/22 with new onset AF. EF normal RV dilated. Cath with nonobstructive CAD. Sent home with [facility] and rate control w/ plans for outpatient DCCV after 3 weeks of a/c.   Readmitted 7/22 with acute HF. TEE EF 20% RV moderately down RA massively dilated. Heavy smoke in RA/LA and aortic root. DCCV canceled.    Developed shock with elevated LFTs. Moved to ICU.   PICC placed. Initial Co-ox 38%. Started on milrinone.   Subjective:    Yesterday milrinone was cut back to 0.125 mcg. Continued on IV amio.   Co-OX 59%.   Denies SOB.  Objective:   Weight Range: 45.4 kg Body mass index is 22.44 kg/m.   Vital Signs:   Temp:  [97.5 F (36.4 C)-98.4 F (36.9 C)] 98 F (36.7 C) (07/26 0727) Pulse Rate:  [52-66] 57 (07/26 0727) Resp:  [16-20] 16 (07/26 0727) BP: (111-125)/(59-76) 125/69 (07/26 0727) SpO2:  [94 %-98 %] 96 % (07/26 0727) Weight:  [45.4 kg] 45.4 kg (07/26 0311) Last BM Date: 10/30/20  Weight change: Filed Weights   10/31/20 0008 10/31/20 0500 11/02/20 0311  Weight: 47 kg 47 kg 45.4 kg    Intake/Output:   Intake/Output Summary (Last 24 hours) at 11/02/2020 0856 Last data filed at 11/02/2020 0708 Gross per 24 hour  Intake 493.47 ml  Output 450 ml  Net 43.47 ml     CVP 4 today Physical Exam  General:   No resp difficulty HEENT: normal Neck: supple. no JVD. Carotids 2+ bilat; no bruits. No lymphadenopathy or thryomegaly appreciated. Cor: PMI nondisplaced. Irregular rate & rhythm. No rubs, gallops or murmurs. Lungs: clear Abdomen: soft, nontender, nondistended. No hepatosplenomegaly. No bruits or masses. Good bowel  sounds. Extremities: no cyanosis, clubbing, rash, edema. RUE PICC  Neuro: alert & orientedx3, cranial nerves grossly intact. moves all 4 extremities w/o difficulty. Affect pleasant  Telemetry   A fib 50-60s    Labs    CBC Recent Labs    11/01/20 0335 11/02/20 0548  WBC 8.4 8.0  HGB 11.0* 10.8*  HCT 32.1* 32.6*  MCV 95.0 96.4  PLT 160 180   Basic Metabolic Panel Recent Labs    50/35/46 0335 11/02/20 0548  NA 131* 131*  K 3.7 4.4  CL 88* 91*  CO2 36* 34*  GLUCOSE 105* 86  BUN 21 18  CREATININE 0.86 0.78  CALCIUM 8.4* 8.3*   Liver Function Tests Recent Labs    11/01/20 0335 11/02/20 0548  AST 129* 112*  ALT 207* 180*  ALKPHOS 204* 186*  BILITOT 0.9 0.9  PROT 5.6* 5.5*  ALBUMIN 2.1* 2.1*   No results for input(s): LIPASE, AMYLASE in the last 72 hours. Cardiac Enzymes No results for input(s): CKTOTAL, CKMB, CKMBINDEX, TROPONINI in the last 72 hours.  BNP: BNP (last 3 results) Recent Labs    10/25/20 0641 10/26/20 0639 10/27/20 0455  BNP 1,137.5* 990.2* 1,027.1*    ProBNP (last 3 results) No results for input(s): PROBNP in the last 8760 hours.   D-Dimer No results for input(s): DDIMER in the last 72 hours. Hemoglobin A1C No results for input(s): HGBA1C in the last  72 hours. Fasting Lipid Panel No results for input(s): CHOL, HDL, LDLCALC, TRIG, CHOLHDL, LDLDIRECT in the last 72 hours. Thyroid Function Tests No results for input(s): TSH, T4TOTAL, T3FREE, THYROIDAB in the last 72 hours.  Invalid input(s): FREET3   Other results:   Imaging    No results found.   Medications:     Scheduled Medications:  apixaban  5 mg Oral BID   atorvastatin  40 mg Oral Daily   Chlorhexidine Gluconate Cloth  6 each Topical Daily   citalopram  20 mg Oral QHS   dapagliflozin propanediol  10 mg Oral Daily   digoxin  0.0625 mg Oral Daily   feeding supplement  237 mL Oral TID BM   multivitamin with minerals  1 tablet Oral Daily   nystatin  5 mL Oral  QID   pantoprazole  40 mg Oral BID   sodium chloride flush  10-40 mL Intracatheter Q12H   spironolactone  25 mg Oral Daily    Infusions:  sodium chloride     amiodarone 30 mg/hr (11/02/20 0708)   milrinone 0.125 mcg/kg/min (11/01/20 1730)    PRN Medications: lidocaine (PF), melatonin, metoprolol tartrate, ondansetron (ZOFRAN) IV, polyethylene glycol     Assessment/Plan    Acute HFrEF/ Nonischemic cardiomyopathy - Echo on 7/18 revealed an EF of 25 to 30 percent, decreased from 55 to 60 percent on 10/06/2020 - LHC 10/07/2020 revealed mild 40% segmental RCA stenosis as well as 50 to 60% second diagonal branch stenosis - Suspect etiology of newly reduced EF is atrial fibrillation - DCCV planned for 7/18 deferred due to concern for LAA thrombus/severe smoke - Remains on milrinone 0.125. Co-ox 59%  Stop milrinone.   - CVP remains 4. Hold diuretics today.   -No room for entresto with soft BP.  - Continue spiro to 25 mg daily.   - Off digoxin due to elevated level--> lower dose was restarted 0.0625 mg daily.  - Beta blocker on hold due to shock  - Continue dapagliflozin 10 mg daily.  - renal function.   2. Atrial fibrillation - Diltiazem started earlier this admission, but stopped due to finding of newly reduced EF. Initially on metoprolol for rate control, but stopped due to concern for shock  - Planned DCCV deferred due to concern for LAA thrombus - Continue amiodarone 30/hr.  Controlled rate.  - She is on Eliquis with LAA thrombus/smoke. No further hematochezia.   - No further BRBPR/melena, heparin stopped and eliquis started.  - Plan repeat TEE later this week to see if smoke has resolved with treatment of shock and can perform DC-CV - otherwise focus on rate control.. Initial echo suggestive of underlying pulmonary HTN with markedly dilated RA so may take some effort to maintain NSR.  Will try to get milrinone weaned down some before TEE/DCCV attempt.  - TEE/DC-CV tomorrow.  -  Check EKG today.   3. Cardiogenic shock - TEE on 7/18 with considerable "smoke" indicating low output with EF of 25 to 30 percent - Initial Co-ox 38%, started on Milrinone  - CO-OX stable. Stop milrinone.   - Resolved.     4. Left atrial appendage thrombus/heavy smoke - Noted on TEE on 7/18 - Now on eliquis.     5. CAD - LHC as noted above: 40% segmental mid RCA stenosis as well as 50 to 60% segmental second diagonal branch stenosis all of which can be treated medically - Beta blocker on hold due to shock.  - Continue atorvastatin.  6. Transaminitis - Surgery and GI consulted -- both believe etiology is congestive hepatopathy due to heart failure - LFTs continue to trend down.  Back on statin.  - HIDA negative for cholecystitis - Started on Unasyn for cholecystitis; however, HIDA negative and WBC trending down. C/w ceftriaxone (last dose 7/22)    7. Pulmonary hypertension - Echo shows severely dilated atria - Optimize from volume standpoint - Can address medically once cardiogenic shock is stabilized    8. COPD - Not currently exacerbated - Flutter valve and incentive spirometry ordered   9. Right pleural effusion - s/p US-guided thoracentesis on 7/13 - CXR on 7/16 shows some reaccumulation - Currently denies SOB and is not hypoxic  - Repeat CXR 7/21 w/ small bilateral pleural effusions    10. Hematuria - mild gross hematuria in purewick. Now resolved.   11. Hematochezia - hgb 12.1 -> 10.8 -> 10.5->11>>10.8  - continue  IV PPI - monitor for recurrent bleeding, no further overnight.  - Given no further hematochezia, she is back on Eliquis.   12. Hyponatremia - Sodium 131.  - free water restrict  13. Severe protein-calorie malnutrition - dietary consult - Family bringing in food.   14. Physical deconditioning - PT/OT  15. GOC - she is very frail. - family discussions ongoing about code status and GOC  Will order HH RN/PT. TOC consulted for meds--farxiga  and eliquis. Due to blindness will need help with medications. Down the road she will need pill packs.  TEE/DC-CV tomorrow.   Length of Stay: 14  Amy Clegg, NP  11/02/2020, 8:56 AM  Advanced Heart Failure Team Pager [phone] (M-F; 7a - 5p)  Please contact [facility] for night-coverage after hours (5p -7a ) and weekends on [email or web address]  Patient seen with NP, agree with the above note.   CVP down to 4, co-ox 60% on milrinone 0.125.  She remains in rate-controlled atrial fibrillation.   General: NAD, frail Neck: No JVD, no thyromegaly or thyroid nodule.  Lungs: Clear to auscultation bilaterally with normal respiratory effort. CV: Nondisplaced PMI.  Heart irregular S1/S2, no S3/S4, no murmur.  No peripheral edema.   Abdomen: Soft, nontender, no hepatosplenomegaly, no distention.  Skin: Intact without lesions or rashes.  Neurologic: Alert and oriented x 3.  Psych: Normal affect. Extremities: No clubbing or cyanosis.  HEENT: Normal.   Stop milrinone today, continue Farxiga, spironolactone, and low dose digoxin.  Check digoxin level in am.   No Lasix today.   She is on Eliquis.  TEE tomorrow, and if LA thrombus has resolved, will do DCCV.   Marca Ancona 11/02/2020 9:34 AM

## 2020-11-02 NOTE — TOC CM/SW Note (Addendum)
Sent benefits check for [place] and Eliquis. Spoke to dtr and states she received RW with seat and bedside commode. She is requesting a hospital bed. Contacted [employer] rep, Ian Malkin for delivery of hospital bed. Contacted [facility] rep, Pearson Grippe and they accepted referral for [facility]. [facility] unable to accept due to staffing. Pt will receive a 30 day free trial card for Eliquis and [place]. Isidoro Donning RN CCM, [facility] ED TOC CM [phone]

## 2020-11-02 NOTE — TOC Benefit Eligibility Note (Signed)
Transition of Care [facility]) Benefit Eligibility Note    Patient Details  Name: Laurie James MRN: [id number] Date of Birth: 09-26-1945   Medication/Dose: Arne Cleveland  5 MG BID  Covered?: Yes  Tier: 3 Drug  Prescription Coverage Preferred Pharmacy: [facility]  , [place] with Person/Company/Phone Number:: KEN   @  [place] [employer] #  [phone]  Co-Pay: $47.00  Prior Approval: No  Deductible: Met (OUT-OF-POCKET : UNMET)  Additional Notes: FARXIGA  10 MG DAILY : COVER- YES , CO-PAY- $ 47.00, TIER- 3 DRUG , P/A- NO    Memory Argue Phone Number: 11/02/2020, 12:54 PM

## 2020-11-03 ENCOUNTER — Encounter (HOSPITAL_COMMUNITY)
Admission: EM | Disposition: A | Discharge status: Home Health | Payer: Self-pay | Source: Home / Self Care | Attending: Internal Medicine

## 2020-11-03 ENCOUNTER — Other Ambulatory Visit (HOSPITAL_COMMUNITY): Payer: Medicare Other

## 2020-11-03 DIAGNOSIS — I5043 Acute on chronic combined systolic (congestive) and diastolic (congestive) heart failure: Secondary | ICD-10-CM | POA: Diagnosis not present

## 2020-11-03 LAB — COOXEMETRY PANEL
Carboxyhemoglobin: 1.5 % (ref 0.5–1.5)
Methemoglobin: 1 % (ref 0.0–1.5)
O2 Saturation: 65 %
Total hemoglobin: 11.4 g/dL — ABNORMAL LOW (ref 12.0–16.0)

## 2020-11-03 LAB — COMPREHENSIVE METABOLIC PANEL
ALT: 155 U/L — ABNORMAL HIGH (ref 0–44)
AST: 91 U/L — ABNORMAL HIGH (ref 15–41)
Albumin: 2.2 g/dL — ABNORMAL LOW (ref 3.5–5.0)
Alkaline Phosphatase: 176 U/L — ABNORMAL HIGH (ref 38–126)
Anion gap: 5 (ref 5–15)
BUN: 21 mg/dL (ref 8–23)
CO2: 31 mmol/L (ref 22–32)
Calcium: 8.3 mg/dL — ABNORMAL LOW (ref 8.9–10.3)
Chloride: 95 mmol/L — ABNORMAL LOW (ref 98–111)
Creatinine, Ser: 0.72 mg/dL (ref 0.44–1.00)
GFR, Estimated: >60 mL/min (ref >60)
Glucose, Bld: 86 mg/dL (ref 70–99)
Potassium: 4.6 mmol/L (ref 3.5–5.1)
Sodium: 131 mmol/L — ABNORMAL LOW (ref 135–145)
Total Bilirubin: 1 mg/dL (ref 0.3–1.2)
Total Protein: 5.7 g/dL — ABNORMAL LOW (ref 6.5–8.1)

## 2020-11-03 LAB — DIGOXIN LEVEL: Digoxin Level: <0.2 ng/mL — ABNORMAL LOW (ref 0.8–2.0)

## 2020-11-03 SURGERY — ECHOCARDIOGRAM, TRANSESOPHAGEAL
Anesthesia: General

## 2020-11-03 MED ORDER — AMIODARONE HCL 200 MG PO TABS
200.0000 mg | ORAL_TABLET | Freq: Two times a day (BID) | ORAL | Status: DC
  Administered 2020-11-03 – 2020-11-04 (×3): 200 mg via ORAL
  Filled 2020-11-03 (×3): qty 1

## 2020-11-03 MED ORDER — FUROSEMIDE 20 MG PO TABS
20.0000 mg | ORAL_TABLET | Freq: Every day | ORAL | Status: DC
  Administered 2020-11-03: 20 mg via ORAL
  Filled 2020-11-03: qty 1

## 2020-11-03 NOTE — Progress Notes (Signed)
OT Cancellation Note  Patient Details Name: Laurie James MRN: [id number] DOB: [birth year]   Cancelled Treatment:    Reason Eval/Treat Not Completed: Patient declined, no reason specified  Evern Bio 11/03/2020, 8:41 AM Martie Round, OTR/L Acute Rehabilitation Services Pager: [phone] Office: [phone]

## 2020-11-03 NOTE — Progress Notes (Signed)
Patient ID: Laurie James, female   DOB: 19-Feb-1946, 75 y.o.   MRN: [id number]     Advanced Heart Failure Rounding Note  PCP-Cardiologist: Charlton Haws, MD  AHF: Dr. Gala Romney   Patient Profile   75 y/o woman with severe COPD, blindness. No known prior cardiac history. Poor baseline functional capacity over the past 2 years.  Admitted 6/22 with new onset AF. EF normal RV dilated. Cath with nonobstructive CAD. Sent home with [facility] and rate control w/ plans for outpatient DCCV after 3 weeks of a/c.   Readmitted 7/22 with acute HF. TEE EF 20% RV moderately down RA massively dilated. Heavy smoke in RA/LA and aortic root. DCCV canceled.    Developed shock with elevated LFTs. Moved to ICU.   PICC placed. Initial Co-ox 38%. Started on milrinone.   Subjective:    Milrinone stopped yesterday, Co-ox 65% today.  CVP 6-7, not on diuretics.   She went back into NSR overnight spontaneously.   Denies dyspnea.   Objective:   Weight Range: 45.3 kg Body mass index is 22.39 kg/m.   Vital Signs:   Temp:  [97.4 F (36.3 C)-98.5 F (36.9 C)] 98.5 F (36.9 C) (07/27 0742) Pulse Rate:  [49-57] 55 (07/27 0742) Resp:  [17-19] 17 (07/27 0742) BP: (112-125)/(63-76) 122/70 (07/27 0742) SpO2:  [95 %-98 %] 96 % (07/27 0742) Weight:  [45.3 kg] 45.3 kg (07/27 0347) Last BM Date: 10/30/20  Weight change: Filed Weights   10/31/20 0500 11/02/20 0311 11/03/20 0347  Weight: 47 kg 45.4 kg 45.3 kg    Intake/Output:   Intake/Output Summary (Last 24 hours) at 11/03/2020 1023 Last data filed at 11/03/2020 0400 Gross per 24 hour  Intake 614.11 ml  Output 1000 ml  Net -385.89 ml     CVP 6-7 today Physical Exam   General: NAD Neck: No JVD, no thyromegaly or thyroid nodule.  Lungs: Clear to auscultation bilaterally with normal respiratory effort. CV: Nondisplaced PMI.  Heart regular S1/S2, no S3/S4, no murmur.  No peripheral edema.   Abdomen: Soft, nontender, no hepatosplenomegaly, no  distention.  Skin: Intact without lesions or rashes.  Neurologic: Alert and oriented x 3.  Psych: Normal affect. Extremities: No clubbing or cyanosis.  HEENT: Normal.   Telemetry   NSR 60s (personally reviewed)   Labs    CBC Recent Labs    11/01/20 0335 11/02/20 0548  WBC 8.4 8.0  HGB 11.0* 10.8*  HCT 32.1* 32.6*  MCV 95.0 96.4  PLT 160 180   Basic Metabolic Panel Recent Labs    01/77/93 0548 11/03/20 0405  NA 131* 131*  K 4.4 4.6  CL 91* 95*  CO2 34* 31  GLUCOSE 86 86  BUN 18 21  CREATININE 0.78 0.72  CALCIUM 8.3* 8.3*   Liver Function Tests Recent Labs    11/02/20 0548 11/03/20 0405  AST 112* 91*  ALT 180* 155*  ALKPHOS 186* 176*  BILITOT 0.9 1.0  PROT 5.5* 5.7*  ALBUMIN 2.1* 2.2*   No results for input(s): LIPASE, AMYLASE in the last 72 hours. Cardiac Enzymes No results for input(s): CKTOTAL, CKMB, CKMBINDEX, TROPONINI in the last 72 hours.  BNP: BNP (last 3 results) Recent Labs    10/25/20 0641 10/26/20 0639 10/27/20 0455  BNP 1,137.5* 990.2* 1,027.1*    ProBNP (last 3 results) No results for input(s): PROBNP in the last 8760 hours.   D-Dimer No results for input(s): DDIMER in the last 72 hours. Hemoglobin A1C No results for input(s):  HGBA1C in the last 72 hours. Fasting Lipid Panel No results for input(s): CHOL, HDL, LDLCALC, TRIG, CHOLHDL, LDLDIRECT in the last 72 hours. Thyroid Function Tests No results for input(s): TSH, T4TOTAL, T3FREE, THYROIDAB in the last 72 hours.  Invalid input(s): FREET3   Other results:   Imaging    No results found.   Medications:     Scheduled Medications:  amiodarone  200 mg Oral BID   apixaban  5 mg Oral BID   atorvastatin  40 mg Oral Daily   Chlorhexidine Gluconate Cloth  6 each Topical Daily   citalopram  20 mg Oral QHS   dapagliflozin propanediol  10 mg Oral Daily   digoxin  0.0625 mg Oral Daily   feeding supplement  237 mL Oral TID BM   multivitamin with minerals  1 tablet  Oral Daily   nystatin  5 mL Oral QID   pantoprazole  40 mg Oral BID   sodium chloride flush  10-40 mL Intracatheter Q12H   spironolactone  25 mg Oral Daily    Infusions:  sodium chloride      PRN Medications: lidocaine (PF), melatonin, metoprolol tartrate, ondansetron (ZOFRAN) IV, polyethylene glycol     Assessment/Plan    Acute HFrEF/ Nonischemic cardiomyopathy - Echo on 7/18 revealed an EF of 25 to 30 percent, decreased from 55 to 60 percent on 10/06/2020 - LHC 10/07/2020 revealed mild 40% segmental RCA stenosis as well as 50 to 60% second diagonal branch stenosis - Suspect etiology of newly reduced EF is atrial fibrillation - DCCV planned for 7/18 deferred due to concern for LAA thrombus/severe smoke - Now off milrinone with co-ox 65%.  - CVP 6-7, can restart Lasix at 20 mg daily po.   - No room for entresto with soft BP.  - Continue spiro to 25 mg daily.   - Digoxin was restarted 0.0625 mg daily, level < 0.2 today.  - Beta blocker on hold due to shock  - Continue dapagliflozin 10 mg daily.   2. Atrial fibrillation - Diltiazem started earlier this admission, but stopped due to finding of newly reduced EF. Initially on metoprolol for rate control, but stopped due to concern for shock  - Planned DCCV deferred due to concern for LAA thrombus - Continue amiodarone 30/hr.  Controlled rate.  - She is on Eliquis with LAA thrombus/smoke. No further hematochezia.   - No further BRBPR/melena, heparin stopped and eliquis started.  - She went back into NSR overnight, TEE-DCCV cancelled.   - Transition to po amiodarone 200 mg bid.   3. Cardiogenic shock - TEE on 7/18 with considerable "smoke" indicating low output with EF of 25 to 30 percent - Initial Co-ox 38%, started on Milrinone  - Co-ox 65% today off milrinone.    4. Left atrial appendage thrombus/heavy smoke - Noted on TEE on 7/18 - Now on eliquis.     5. CAD - LHC as noted above: 40% segmental mid RCA stenosis as well  as 50 to 60% segmental second diagonal branch stenosis all of which can be treated medically - Beta blocker on hold due to shock.  - Continue atorvastatin.     6. Transaminitis - Surgery and GI consulted -- both believe etiology is congestive hepatopathy due to heart failure - LFTs continue to trend down.  Back on statin.  - HIDA negative for cholecystitis - Started on Unasyn for cholecystitis; however, HIDA negative and WBC trending down. C/w ceftriaxone (last dose 7/22)    7. Pulmonary  hypertension - Echo shows severely dilated atria - Optimize from volume standpoint - Can address medically once cardiogenic shock is stabilized    8. COPD - Not currently exacerbated - Flutter valve and incentive spirometry ordered   9. Right pleural effusion - s/p US-guided thoracentesis on 7/13 - CXR on 7/16 shows some reaccumulation - Currently denies SOB and is not hypoxic  - Repeat CXR 7/21 w/ small bilateral pleural effusions    10. Hematuria - mild gross hematuria in purewick. Now resolved.   11. Hematochezia - hgb 12.1 -> 10.8 -> 10.5->11>>10.8  - continue  IV PPI - monitor for recurrent bleeding, no further overnight.  - Given no further hematochezia, she is back on Eliquis.   12. Hyponatremia - Sodium 131.  - free water restrict  13. Severe protein-calorie malnutrition - dietary consult - Family bringing in food.   14. Physical deconditioning - PT/OT  15. GOC - she is very frail.  If she remains stable in NSR, can probably go home tomorrow.   Length of Stay: 15  Marca Ancona, MD  11/03/2020, 10:23 AM  Advanced Heart Failure Team Pager [phone] (M-F; 7a - 5p)  Please contact [facility] for night-coverage after hours (5p -7a ) and weekends on [email or web address]

## 2020-11-03 NOTE — Progress Notes (Signed)
Physical Therapy Treatment Patient Details Name: Laurie James MRN: [id number] DOB: Jun 20, 1945 Today's Date: 11/03/2020    History of Present Illness 75 y.o. F admitted 7/13 due to worsening SOB and increased weakness. Pt with pneumonia and bilateral pleural effusions, right greater than left. 7/18 TEE shows new cardiomyopathy. PMHx: newly diagnosed A. fib, COPD, hypertension, hyperlipidemia, chronic anxiety/depression, GERD, memory impairment, vision impairment.    PT Comments    Pt received in supine, agreeable to therapy session with encouragement from PTA/daughter and with good tolerance for gait and transfer training. Pt needing min guard to minA for transfers/gait, pt self-limiting distance due to fatigue/mild dizziness, BP stable seated in chair after mobility tasks. Pt able to perform her own peri-care with minA for standing balance. Emphasis on importance of OOB mobility and gradual progression of activity as tolerated, use of IS/HEP with instruction on UE/LE exercises, fall risk prevention and use of gait belt for caregiver, and close vitals assessment. Pt continues to benefit from PT services to progress toward functional mobility goals.   Follow Up Recommendations  Home health PT;Supervision for mobility/OOB     Equipment Recommendations  Rolling walker with 5" wheels;3in1 (PT)    Recommendations for Other Services       Precautions / Restrictions Precautions Precautions: Fall Precaution Comments: Limited vision Restrictions Weight Bearing Restrictions: No    Mobility  Bed Mobility Overal bed mobility: Needs Assistance Bed Mobility: Supine to Sit     Supine to sit: Supervision     General bed mobility comments: supervision for lines and verbal cues, no physical assist with bed flat    Transfers Overall transfer level: Needs assistance Equipment used: Rolling walker (2 wheeled);1 person hand held assist Transfers: Sit to/from [employer] Sit to Stand: Min guard Stand pivot transfers: Min guard       General transfer comment: cues for hand placement, pt with tendency to place hands on [place]. from EOB, BSC, recliner.  Ambulation/Gait Ambulation/Gait assistance: Min assist Gait Distance (Feet): 60 Feet Assistive device: Rolling walker (2 wheeled) Gait Pattern/deviations: Step-through pattern;Decreased stride length;Trunk flexed     General Gait Details: pt needs cues for posture, position in RW and directional cues. Min assist to direct and control RW. Pt c/o dizziness during trial but BP stable once seated in chair   Stairs             Wheelchair Mobility    Modified Rankin (Stroke Patients Only)       Balance Overall balance assessment: Needs assistance Sitting-balance support: No upper extremity supported;Feet supported Sitting balance-Leahy Scale: Fair     Standing balance support: Bilateral upper extremity supported Standing balance-Leahy Scale: Poor Standing balance comment: bil Ue support during standing and gait                            Cognition Arousal/Alertness: Awake/alert Behavior During Therapy: Flat affect Overall Cognitive Status: Within Functional Limits for tasks assessed Area of Impairment: Problem solving;Memory                     Memory: Decreased short-term memory       Problem Solving: Slow processing;Difficulty sequencing;Requires verbal cues;Decreased initiation;Requires tactile cues General Comments: follows directional cues with mobility, sometimes needs hand over hand guidance due to visual impairment; at times needs repetition of cues - pt is [facility]      Exercises General Exercises - Lower Extremity Long Arc  Quad: AROM;Both;15 reps;Seated Hip Flexion/Marching: AROM;Both;Seated;15 reps Toe Raises: AROM;Both;10 reps Other Exercises Other Exercises: supine BLE AROM: ankle pumps, quad sets (with tcs), glute sets x5-15 reps ea (handout  given to daughter to reinforce) Other Exercises: IS x 10 reps 300-700 mL    General Comments General comments (skin integrity, edema, etc.): BP 127/85 (97) after gait trial, SpO2 98% on RA, HR 77-85 bpm with exertion; pt daughter given gait belt at end of session to take home      Pertinent Vitals/Pain Pain Assessment: No/denies pain Pain Intervention(s): Monitored during session;Repositioned    Home Living                      Prior Function            PT Goals (current goals can now be found in the care plan section) Acute Rehab PT Goals Patient Stated Goal: home PT Goal Formulation: With patient/family Time For Goal Achievement: 11/05/20 Progress towards PT goals: Progressing toward goals    Frequency    Min 3X/week      PT Plan Current plan remains appropriate    Co-evaluation              AM-PAC PT "6 Clicks" Mobility   Outcome Measure  Help needed turning from your back to your side while in a flat bed without using bedrails?: None Help needed moving from lying on your back to sitting on the side of a flat bed without using bedrails?: A Little Help needed moving to and from a bed to a chair (including a wheelchair)?: A Little Help needed standing up from a chair using your arms (e.g., wheelchair or bedside chair)?: A Little Help needed to walk in hospital room?: A Little Help needed climbing 3-5 steps with a railing? : A Little 6 Click Score: 19    End of Session Equipment Utilized During Treatment: Gait belt Activity Tolerance: Patient tolerated treatment well Patient left: in chair;with call bell/phone within reach;with family/visitor present Nurse Communication: Mobility status PT Visit Diagnosis: Muscle weakness (generalized) (M62.81);Other abnormalities of gait and mobility (R26.89);Difficulty in walking, not elsewhere classified (R26.2)     Time: 1443-1540 PT Time Calculation (min) (ACUTE ONLY): 32 min  Charges:  $Gait Training:  8-22 mins $Therapeutic Exercise: 8-22 mins                     Tomoya Ringwald P., PTA Acute Rehabilitation Services Pager: [phone] Office: [phone]    Angus Palms 11/03/2020, 12:51 PM

## 2020-11-04 ENCOUNTER — Other Ambulatory Visit (HOSPITAL_COMMUNITY): Payer: Self-pay

## 2020-11-04 DIAGNOSIS — I5043 Acute on chronic combined systolic (congestive) and diastolic (congestive) heart failure: Secondary | ICD-10-CM | POA: Diagnosis not present

## 2020-11-04 LAB — COMPREHENSIVE METABOLIC PANEL
ALT: 137 U/L — ABNORMAL HIGH (ref 0–44)
AST: 78 U/L — ABNORMAL HIGH (ref 15–41)
Albumin: 2.2 g/dL — ABNORMAL LOW (ref 3.5–5.0)
Alkaline Phosphatase: 157 U/L — ABNORMAL HIGH (ref 38–126)
Anion gap: 6 (ref 5–15)
BUN: 24 mg/dL — ABNORMAL HIGH (ref 8–23)
CO2: 29 mmol/L (ref 22–32)
Calcium: 8.2 mg/dL — ABNORMAL LOW (ref 8.9–10.3)
Chloride: 96 mmol/L — ABNORMAL LOW (ref 98–111)
Creatinine, Ser: 0.75 mg/dL (ref 0.44–1.00)
GFR, Estimated: >60 mL/min (ref >60)
Glucose, Bld: 76 mg/dL (ref 70–99)
Potassium: 4.6 mmol/L (ref 3.5–5.1)
Sodium: 131 mmol/L — ABNORMAL LOW (ref 135–145)
Total Bilirubin: 1.2 mg/dL (ref 0.3–1.2)
Total Protein: 5.7 g/dL — ABNORMAL LOW (ref 6.5–8.1)

## 2020-11-04 LAB — CBC
HCT: 33.6 % — ABNORMAL LOW (ref 36.0–46.0)
Hemoglobin: 10.8 g/dL — ABNORMAL LOW (ref 12.0–15.0)
MCH: 31.6 pg (ref 26.0–34.0)
MCHC: 32.1 g/dL (ref 30.0–36.0)
MCV: 98.2 fL (ref 80.0–100.0)
Platelets: 183 10*3/uL (ref 150–400)
RBC: 3.42 MIL/uL — ABNORMAL LOW (ref 3.87–5.11)
RDW: 15.8 % — ABNORMAL HIGH (ref 11.5–15.5)
WBC: 7.2 10*3/uL (ref 4.0–10.5)
nRBC: 0 % (ref 0.0–0.2)

## 2020-11-04 LAB — COOXEMETRY PANEL
Carboxyhemoglobin: 1.6 % — ABNORMAL HIGH (ref 0.5–1.5)
Methemoglobin: 1 % (ref 0.0–1.5)
O2 Saturation: 48.5 %
Total hemoglobin: 11.1 g/dL — ABNORMAL LOW (ref 12.0–16.0)

## 2020-11-04 MED ORDER — SACUBITRIL-VALSARTAN 24-26 MG PO TABS
1.0000 | ORAL_TABLET | Freq: Two times a day (BID) | ORAL | 6 refills | Status: DC
  Filled 2020-11-04: qty 60, 30d supply, fill #0

## 2020-11-04 MED ORDER — DAPAGLIFLOZIN PROPANEDIOL 10 MG PO TABS
10.0000 mg | ORAL_TABLET | Freq: Every day | ORAL | 6 refills | Status: DC
Start: 2020-11-04 — End: 2020-11-25
  Filled 2020-11-04: qty 30, 30d supply, fill #0

## 2020-11-04 MED ORDER — PANTOPRAZOLE SODIUM 40 MG PO TBEC
40.0000 mg | DELAYED_RELEASE_TABLET | Freq: Two times a day (BID) | ORAL | 6 refills | Status: DC
Start: 2020-11-04 — End: 2021-05-31
  Filled 2020-11-04: qty 60, 30d supply, fill #0

## 2020-11-04 MED ORDER — FUROSEMIDE 20 MG PO TABS
20.0000 mg | ORAL_TABLET | ORAL | 6 refills | Status: DC | PRN
  Filled 2020-11-04: qty 30, 30d supply, fill #0

## 2020-11-04 MED ORDER — DIGOXIN 125 MCG PO TABS
0.0625 mg | ORAL_TABLET | Freq: Every day | ORAL | 6 refills | Status: DC
  Filled 2020-11-04: qty 30, 60d supply, fill #0

## 2020-11-04 MED ORDER — SACUBITRIL-VALSARTAN 24-26 MG PO TABS
1.0000 | ORAL_TABLET | Freq: Two times a day (BID) | ORAL | Status: DC
  Administered 2020-11-04: 1 via ORAL
  Filled 2020-11-04: qty 1

## 2020-11-04 MED ORDER — AMIODARONE HCL 200 MG PO TABS
200.0000 mg | ORAL_TABLET | Freq: Two times a day (BID) | ORAL | 6 refills | Status: DC
  Filled 2020-11-04: qty 60, 30d supply, fill #0

## 2020-11-04 MED ORDER — SPIRONOLACTONE 25 MG PO TABS
25.0000 mg | ORAL_TABLET | Freq: Every day | ORAL | 6 refills | Status: DC
  Filled 2020-11-04: qty 30, 30d supply, fill #0

## 2020-11-04 NOTE — Plan of Care (Signed)

## 2020-11-04 NOTE — Progress Notes (Signed)
Discharge instructions (including medications) discussed with and copy provided to patient/caregiver.   Patient IV access removed. Wheeled to lobby for discharge by hospital staff.

## 2020-11-04 NOTE — TOC Benefit Eligibility Note (Signed)
Transition of Care [facility]) Benefit Eligibility Note    Patient Details  Name: Raimi Guillermo MRN: [id number] Date of Birth: 1945-12-04   Medication/Dose: [name] 24-26  MG BID  Covered?: Yes  Tier: 3 Drug  Prescription Coverage Preferred Pharmacy: [facility]  Spoke with Person/Company/Phone Number:: ELIZABETH    @  [employer] # [phone]  Co-Pay: $ 47.00  Prior Approval: No  Deductible: Met (OUT-OF-POCKET:UNMET)  Additional Notes: FARXIGA  10 MG DAILY  : COVER- YES , CO-PAY- $47.00 , TIER- 3 DRUG , P/A - NO    Memory Argue Phone Number: 11/04/2020, 10:28 AM

## 2020-11-04 NOTE — Care Management Important Message (Signed)
Important Message  Patient Details  Name: Laurie James MRN: [id number] Date of Birth: 1946-01-09   Medicare Important Message Given:  Yes     Dimitri Dsouza P Daymeon Fischman 11/04/2020, 11:00 AM

## 2020-11-04 NOTE — Discharge Summary (Addendum)
Advanced Heart Failure Team  Discharge Summary   Patient ID: Laurie James MRN: [id number], DOB/AGE: Oct 25, 1945 75 y.o. Admit date: 10/19/2020 D/C date:     11/04/2020   Primary Discharge Diagnoses:  Acute systolic CHF Cardiogenic shock Nonischemic cardiomyopathy Atrial fibrillation with RVR Left atrial appendage thrombus CAD Transaminitis Pulmonary hypertension COPD Right pleural effusion Hematuria Hematochezia Severe protein-calorie malnutrition  Hospital Course:  75 y/o woman with severe COPD, blindness, malnutrition/frailty. No known prior cardiac history. Poor baseline functional capacity over the past 2 years.  Admitted 6/22 with new onset AF. EF normal and RV dilated. Cath with nonobstructive CAD. Sent home with [facility] and rate control w/ plans for outpatient DCCV after 3 weeks of a/c.   Readmitted 7/22 with acute HF and developed cardiogenic shock. TEE EF 20% RV moderately down RA massively dilated. Heavy smoke in RA/LA and aortic root. DCCV canceled. She was anticoagulated. Developed shock with elevated LFTs. Moved to ICU.  PICC placed. Initial Co-ox 38% and required milrinone. Had right sided thoracentesis on 07/13 and subsequent chest x-rays showed some reaccumulation. Diuresed with IV lasix. Po lasix PRN at discharge.  Initiated on GDMT with entresto, spiro, digoxin, and dapagliflozin. Med titration limited by hypotension.  Had elevated LFTs. Surgery and GI consulted and felt this was due to congestive hepatopathy from HF. Did have some hematuria and hematochezia which resolved prior to discharge. Hemoglobin remained stable.     Hospital Course by Problem: Acute systolic CHF/ Nonischemic cardiomyopathy - Echo on 7/18 revealed an EF of 25 to 30 percent, decreased from 55 to 60 percent on 10/06/2020 - LHC 10/07/2020 revealed mild 40% segmental RCA stenosis as well as 50 to 60% second diagonal branch stenosis - Suspect etiology of newly reduced EF is atrial  fibrillation. DCCV planned but aborted d/t concern for LAA thrombus/severe smoke. Chemically converted to SR. - Now off milrinone with co-ox 48.5% .  - Volume status stable. Unable to weigh daily at home due to blindness. Lasix switched to prn after initiated entresto and dapagliflozin. - Continue entresto 24/26 mg BID - Continue spiro 25 mg daily.   - Digoxin was restarted 0.0625 mg daily, level < 0.2.  - Beta blocker on hold due to shock and bradycardia - Continue dapagliflozin 10 mg daily.  - Plan to repeat ECHO in 3 months.   2. Cardiogenic shock -TEE on 7/18 with considerable "smoke" indicating low output with EF of 25 to 30 percent -Initial Co-ox 38%, started on Milrinone -Co-ox today 48.5% off milrinone.   3. Atrial fibrillation -Diltiazem started earlier this admission, but stopped due to HFrEF. Beta blocker D/C'd d/t shock. -Deferred cardioversion due to concern for LAA thrombus -Chemically converted to SR. Remains in SR today. -Now on amio 200 mg twice a day. Cut back at office visit if still in SR.  -Anticoagulated with eliquis -No further hematochezia.   -No further BRBPR/melena   4. Left atrial appendage thrombus/heavy smoke - Noted on TEE on 7/18 - Now on eliquis.     5. CAD -LHC with 40% segmental mid RCA stenosis as well as 50 to 60% segmental second diagonal branch stenosis all of which can be treated medically -No BB d/t shock -Continue atorvastatin.     6. Transaminitis - Surgery and GI consulted -- both believe etiology is congestive hepatopathy due to heart failure - LFTs continue to trend down.  Back on statin. - HIDA negative for cholecystitis - Started on Unasyn for cholecystitis; however, HIDA negative and WBC  trending down. C/w ceftriaxone (last dose 7/22)   7. Pulmonary hypertension - Echo shows severely dilated atria - Optimized from volume standpoint   8. COPD - Not in exacerbation during admit   9. Right pleural effusion -s/p US-guided  thoracentesis on 7/13 -CXR on 7/16 showed some reaccumulation -Repeat CXR 7/21 w/ small bilateral pleural effusions -No significant dyspnea and not hypoxic   10. Hematuria -mild gross hematuria in purewick. Now resolved.   11. Hematochezia -hgb stable 10.8   -Was on IV PPI but transitioned to po protonix.  -Given no further hematochezia, she is back on Eliquis. -monitor for recurrent bleeding   12. Hyponatremia -Sodium stable at 131. -restricted free water   13. Severe protein-calorie malnutrition -dietary consulted -Family brought in food.  14. Frailty TOC as well as PT/OT evaluated pateint.  Discharged home with [facility] and received hospital bed/bedside comode/RW with seat   MEDS to [facility] at D/C. 30 day free card for apixaban and farxiga    Discharge Weight Range: 94 lb on admit > up to 112 lb. D/C weight 99 lb  Discharge Vitals: Blood pressure 123/69, pulse 62, temperature 97.6 F (36.4 C), temperature source Oral, resp. rate 17, height 4\' 8"  (1.422 m), weight 45.1 kg, SpO2 98 %.  Labs: Lab Results  Component Value Date   WBC 7.2 11/04/2020   HGB 10.8 (L) 11/04/2020   HCT 33.6 (L) 11/04/2020   MCV 98.2 11/04/2020   PLT 183 11/04/2020    Recent Labs  Lab 11/04/20 0431  NA 131*  K 4.6  CL 96*  CO2 29  BUN 24*  CREATININE 0.75  CALCIUM 8.2*  PROT 5.7*  BILITOT 1.2  ALKPHOS 157*  ALT 137*  AST 78*  GLUCOSE 76   Lab Results  Component Value Date   CHOL 154 10/06/2020   HDL 73 10/06/2020   LDLCALC 74 10/06/2020   TRIG 35 10/06/2020   BNP (last 3 results) Recent Labs    10/25/20 0641 10/26/20 0639 10/27/20 0455  BNP 1,137.5* 990.2* 1,027.1*    ProBNP (last 3 results) No results for input(s): PROBNP in the last 8760 hours.   Diagnostic Studies/Procedures   No results found.  Discharge Medications   Allergies as of 11/04/2020       Reactions   Codeine Nausea And Vomiting        Medication List     STOP taking these  medications    diltiazem 180 MG 24 hr capsule Commonly known as: CARDIZEM CD   esomeprazole 40 MG capsule Commonly known as: NEXIUM   metoprolol tartrate 25 MG tablet Commonly known as: LOPRESSOR       TAKE these medications    albuterol 108 (90 Base) MCG/ACT inhaler Commonly known as: VENTOLIN HFA Inhale 2 puffs into the lungs every 4 (four) hours as needed for wheezing.   amiodarone 200 MG tablet Commonly known as: PACERONE Take 1 tablet (200 mg total) by mouth 2 (two) times daily.   apixaban 5 MG Tabs tablet Commonly known as: ELIQUIS Take 1 tablet (5 mg total) by mouth 2 (two) times daily.   atorvastatin 40 MG tablet Commonly known as: LIPITOR Take 1 tablet (40 mg total) by mouth daily.   citalopram 20 MG tablet Commonly known as: CELEXA Take 20 mg by mouth at bedtime.   cyanocobalamin 1000 MCG/ML injection Commonly known as: (VITAMIN B-12) Inject 1,000 mcg into the muscle every 30 (thirty) days.   digoxin 0.125 MG tablet Commonly known as:  LANOXIN Take 0.5 tablets (0.0625 mg total) by mouth daily. What changed: how much to take   Entresto 24-26 MG Generic drug: sacubitril-valsartan Take 1 tablet by mouth 2 (two) times daily.   Farxiga 10 MG Tabs tablet Generic drug: dapagliflozin propanediol Take 1 tablet (10 mg total) by mouth daily.   furosemide 20 MG tablet Commonly known as: LASIX Take 1 tablet (20 mg total) by mouth as needed. Leg edema   nitroGLYCERIN 0.4 MG SL tablet Commonly known as: NITROSTAT Place 1 tablet (0.4 mg total) under the tongue every 5 (five) minutes x 3 doses as needed for chest pain.   pantoprazole 40 MG tablet Commonly known as: PROTONIX Take 1 tablet (40 mg total) by mouth 2 (two) times daily.   promethazine 25 MG tablet Commonly known as: PHENERGAN Take 25 mg by mouth 3 (three) times daily as needed for nausea.   spironolactone 25 MG tablet Commonly known as: ALDACTONE Take 1 tablet (25 mg total) by mouth daily.                Durable Medical Equipment  (From admission, onward)           Start     Ordered   11/02/20 1039  For home use only DME Hospital bed  Once       Question Answer Comment  Length of Need Lifetime   Patient has (list medical condition): CHF   The above medical condition requires: Patient requires the ability to reposition frequently   Head must be elevated greater than: 45 degrees   Bed type Semi-electric   Trapeze Bar Yes   Support Surface: Gel Overlay      11/02/20 1038   11/01/20 0735  Heart failure home health orders  (Heart failure home health orders / Face to face)  Once       Comments: Heart Failure Follow-up Care:  Verify follow-up appointments per Patient Discharge Instructions. Confirm transportation arranged. Reconcile home medications with discharge medication list. Remove discontinued medications from use. Assist patient/caregiver to manage medications using pill box. Reinforce low sodium food selection Assessments: Vital signs and oxygen saturation at each visit. Assess home environment for safety concerns, caregiver support and availability of low-sodium foods. Consult Child psychotherapist, PT/OT, Dietitian, and CNA based on assessments. Perform comprehensive cardiopulmonary assessment. Notify MD for any change in condition or weight gain of 3 pounds in one day or 5 pounds in one week with symptoms. Daily Weights and Symptom Monitoring: Ensure patient has access to scales. Teach patient/caregiver to weigh daily before breakfast and after voiding using same scale and record.    Teach patient/caregiver to track weight and symptoms and when to notify Provider. Activity: Develop individualized activity plan with patient/caregiver.   HHRN HHPT  Question Answer Comment  Heart Failure Follow-up Care [facility] ([facility]) [facility] at [phone]   Obtain the following labs Basic Metabolic Panel   Obtain the following labs Other see comments   Lab  frequency Other see comments   Fax lab results to AHF Clinic at [phone]   Diet Low Sodium Heart Healthy   Fluid restrictions: 2000 mL Fluid      11/01/20 0735   10/29/20 1038  For home use only DME 4 wheeled rolling walker with seat  Once       Question:  Patient needs a walker to treat with the following condition  Answer:  CHF (congestive heart failure) (HCC)   10/29/20 1038   10/29/20 1038  For  home use only DME 3 n 1  Once        10/29/20 1038            Disposition   The patient will be discharged in stable condition to home. Discharge Instructions     (HEART FAILURE PATIENTS) Call MD:  Anytime you have any of the following symptoms: 1) 3 pound weight gain in 24 hours or 5 pounds in 1 week 2) shortness of breath, with or without a dry hacking cough 3) swelling in the hands, feet or stomach 4) if you have to sleep on extra pillows at night in order to breathe.   Complete by: As directed    Diet - low sodium heart healthy   Complete by: As directed    Heart Failure patients record your daily weight using the same scale at the same time of day   Complete by: As directed    Increase activity slowly   Complete by: As directed        Follow-up Information     Georgann Housekeeper, MD. Schedule an appointment as soon as possible for a visit in 1 week(s).   Specialty: Internal Medicine Contact information: [address] [place] [address] [phone]         Sebastian Ache, MD. Schedule an appointment as soon as possible for a visit in 1 week(s).   Specialty: Urology Why: Right-sided hydronephrosis Contact information: [address] [place] [address] [phone]         Josephine Igo, DO. Schedule an appointment as soon as possible for a visit in 1 week(s).   Specialty: Pulmonary Disease Why: Recurrent pleural effusion likely exudative Contact information: [address] [place] [address] [phone]          [facility] Follow up on 11/12/2020.   Specialty: Cardiology Why: at 3:30  Located on the 1st floor of [place]. Bring all medications. Contact information: [address] [phone] mc [address] [place] [address] [phone]        [facility] Follow up.   Why: Home Health RN and Physical Therapy - agency will call to arrange appt Contact information: [address] [place] [address] [phone]                   Duration of Discharge Encounter: Greater than 35 minutes   Signed, Sharee Sturdy N  11/04/2020, 10:21 AM

## 2020-11-04 NOTE — Progress Notes (Deleted)
Patient ID: Laurie James, female   DOB: [birth year], 75 y.o.   MRN: [id number]   75 y.o. with high calcium score and moderate CAD by cardiac CT in 2013. She had normal myovue in 2016 and again in 10-22-19  Son in his 75's died last year of MI. He did not like to go to doctors. Son Fraser Din with her today and has daughter in town as well. She continues to smoke a ppd. Activity limited by balance issues and poor eye sight with macular degeneration   Hospitalized 14-Jul-2022with chest pain and PAF TTE 10/06/20 EF 55-60% RVE estimated PA 44 severe bi atrial enlargement mild MR ctha 10/07/20 with tightest lesion only 60% D2 and 40% mid RCA TEE 10/25/20 EF down 20-25% ? LAA thrombus severe TR [facility] not done Seen by advanced CHF team Elevated LFTls AST/ALT 682/ 472 PIC line place coox 38% Started on milrinone and NE with gentle diuresis Converted to NSR on amiodarone D/c with Eliquis D/c CHF meds  Amiodarone 200 mg bid Entresto 24/26 Aldactone 25 mg Elquis 5 bid Lipitor 40 mg  Farxiga 10 mg  Lasix 20 mg as needed   ***  ROS: Denies fever, malais, weight loss, blurry vision, decreased visual acuity, cough, sputum, SOB, hemoptysis, pleuritic pain, palpitaitons, heartburn, abdominal pain, melena, lower extremity edema, claudication, or rash.  All other systems reviewed and negative  General: Affect appropriate Chronically ill thin bronchitic female  HEENT: normal Neck supple with no adenopathy JVP normal no bruits no thyromegaly Lungs clear with no wheezing and good diaphragmatic motion Heart:  S1/S2 no murmur,rub, gallop or click PMI normal Abdomen: benighn, BS positve, no tenderness, no AAA no bruit.  No HSM or HJR Distal pulses intact with no bruits No edema Neuro non-focal Skin warm and dry No muscular weakness Peripheral pulses hard to palpate   Medications No current facility-administered medications for this visit.   Current Outpatient Medications  Medication Sig  Dispense Refill   amiodarone (PACERONE) 200 MG tablet Take 1 tablet (200 mg total) by mouth 2 (two) times daily. 60 tablet 6   dapagliflozin propanediol (FARXIGA) 10 MG TABS tablet Take 1 tablet (10 mg total) by mouth daily. 30 tablet 6   digoxin (LANOXIN) 0.125 MG tablet Take 0.5 tablets (0.0625 mg total) by mouth daily. 30 tablet 6   furosemide (LASIX) 20 MG tablet Take 1 tablet (20 mg total) by mouth as needed. Leg edema 30 tablet 6   pantoprazole (PROTONIX) 40 MG tablet Take 1 tablet (40 mg total) by mouth 2 (two) times daily. 60 tablet 6   sacubitril-valsartan (ENTRESTO) 24-26 MG Take 1 tablet by mouth 2 (two) times daily. 60 tablet 6   spironolactone (ALDACTONE) 25 MG tablet Take 1 tablet (25 mg total) by mouth daily. 30 tablet 6   Facility-Administered Medications Ordered in Other Visits  Medication Dose Route Frequency Provider Last Rate Last Admin   0.9 %  sodium chloride infusion   Intravenous Continuous Clegg, Amy D, NP       amiodarone (PACERONE) tablet 200 mg  200 mg Oral BID Laurey Morale, MD   200 mg at 11/04/20 9323   apixaban (ELIQUIS) tablet 5 mg  5 mg Oral BID Earnie Larsson, RPH   5 mg at 11/04/20 0955   atorvastatin (LIPITOR) tablet 40 mg  40 mg Oral Daily Bensimhon, Bevelyn Buckles, MD   40 mg at 11/04/20 0956   Chlorhexidine Gluconate Cloth 2 % PADS 6  each  6 each Topical Daily Bensimhon, Bevelyn Buckles, MD   6 each at 11/04/20 0521   citalopram (CELEXA) tablet 20 mg  20 mg Oral QHS Bensimhon, Bevelyn Buckles, MD   20 mg at 10/20/20 0035   dapagliflozin propanediol (FARXIGA) tablet 10 mg  10 mg Oral Daily Laurey Morale, MD   10 mg at 11/04/20 4098   digoxin (LANOXIN) tablet 0.0625 mg  0.0625 mg Oral Daily Laurey Morale, MD   0.0625 mg at 11/04/20 1191   feeding supplement (ENSURE ENLIVE / ENSURE PLUS) liquid 237 mL  237 mL Oral TID BM Bensimhon, Bevelyn Buckles, MD   237 mL at 11/04/20 0956   lidocaine (PF) (XYLOCAINE) 1 % injection    PRN Alwyn Ren R, NP   10 mL at 10/20/20 1245    melatonin tablet 3 mg  3 mg Oral QHS PRN Bensimhon, Bevelyn Buckles, MD   3 mg at 10/28/20 2059   multivitamin with minerals tablet 1 tablet  1 tablet Oral Daily Bensimhon, Bevelyn Buckles, MD   1 tablet at 11/04/20 4782   nystatin (MYCOSTATIN) 100000 UNIT/ML suspension 500,000 Units  5 mL Oral QID Bensimhon, Bevelyn Buckles, MD   500,000 Units at 11/04/20 0951   ondansetron [facility]) injection 4 mg  4 mg Intravenous Q6H PRN Bensimhon, Bevelyn Buckles, MD   4 mg at 10/23/20 2049   pantoprazole (PROTONIX) EC tablet 40 mg  40 mg Oral BID Earnie Larsson, RPH   40 mg at 11/04/20 9562   polyethylene glycol (MIRALAX / GLYCOLAX) packet 17 g  17 g Oral Daily PRN Bensimhon, Bevelyn Buckles, MD       sacubitril-valsartan (ENTRESTO) 24-26 mg per tablet  1 tablet Oral BID Clegg, Amy D, NP   1 tablet at 11/04/20 0951   sodium chloride flush (NS) 0.9 % injection 10-40 mL  10-40 mL Intracatheter Q12H Bensimhon, Bevelyn Buckles, MD   10 mL at 11/04/20 1308   spironolactone (ALDACTONE) tablet 25 mg  25 mg Oral Daily Clegg, Amy D, NP   25 mg at 11/04/20 6578    Allergies Codeine  Family History: Family History  Problem Relation Age of Onset   Breast cancer Sister    Breast cancer Sister     Social History: Social History   Socioeconomic History   Marital status: Married    Spouse name: Not on file   Number of children: Not on file   Years of education: Not on file   Highest education level: Not on file  Occupational History   Not on file  Tobacco Use   Smoking status: Every Day    Packs/day: 1.00    Years: 50.00    Pack years: 50.00    Types: Cigarettes   Smokeless tobacco: Never  Substance and Sexual Activity   Alcohol use: No    Alcohol/week: 0.0 standard drinks    Comment: rarely   Drug use: No   Sexual activity: Not on file  Other Topics Concern   Not on file  Social History Narrative   Not on file   Social Determinants of Health   Financial Resource Strain: Not on file  Food Insecurity: Not on file  Transportation  Needs: No Transportation Needs   Lack of Transportation (Medical): No   Lack of Transportation (Non-Medical): No  Physical Activity: Not on file  Stress: Not on file  Social Connections: Not on file  Intimate Partner Violence: Not on file    Past Surgical History:  Procedure Laterality Date   ABDOMINAL HYSTERECTOMY     IR THORACENTESIS ASP PLEURAL SPACE W/IMG GUIDE  10/20/2020   RIGHT/LEFT HEART CATH AND CORONARY ANGIOGRAPHY N/A 10/07/2020   Procedure: RIGHT/LEFT HEART CATH AND CORONARY ANGIOGRAPHY;  Surgeon: Runell Gess, MD;  Location: [facility] INVASIVE CV LAB;  Service: Cardiovascular;  Laterality: N/A;   TEE WITHOUT CARDIOVERSION N/A 10/25/2020   Procedure: TRANSESOPHAGEAL ECHOCARDIOGRAM (TEE);  Surgeon: Quintella Reichert, MD;  [facility] ENDOSCOPY;  Service: Cardiovascular;  Laterality: N/A;    Past Medical History:  Diagnosis Date   AMD (age related macular degeneration)    COPD (chronic obstructive pulmonary disease) (HCC)    Depression    Hiatal hernia    Intermittent low back pain    Memory impairment    Midsternal chest pain    a. 12/2011 Cardiac CTA Ca++ score of 103.3 (80th %), LAD <50p/m, RCA 50-75.   Osteoporosis    Vitamin B 12 deficiency     Electrocardiogram:  12/23/11  SR rate 94 nonspecific ST changes  06/30/16 SR rate 102 PAC nonspecific ST/T wave changes 07/28/19 SR rate 84 nonspecific lateral T wave changes   Assessment and Plan  1. CAD: moderate D2 non-obstructive elsewhere cath 10/07/20  2. COPD with WHO 2/3 pulmonary HTN f/u pulmonary d/c smoking  3. Afib:  with shock/bradycardia NSR on amiodarone continue eliquis 4. DCM:  EF 20-25% needing NE/milrinone in hospital continue entresto, farxiga Aldactone labs today maintenance of NSR important f/u advanced CHF team  F/U echo October hopefully will improve in NSR   ***  Charlton Haws MD [facility]

## 2020-11-04 NOTE — Progress Notes (Addendum)
TOC CM sent benefits check for [place]. Spoke to dtr and they are delivering hospital bed today. Contacted [facility] rep, Pearson Grippe to make aware pt is dc today. Isidoro Donning RN CCM, [facility] ED TOC CM [phone]

## 2020-11-04 NOTE — Plan of Care (Signed)
Discharge instructions reviewed with patient and daughter at bedside.  Problem: Education: Goal: Knowledge of General Education information will improve Description: Including pain rating scale, medication(s)/side effects and non-pharmacologic comfort measures Outcome: Adequate for Discharge   Problem: Health Behavior/Discharge Planning: Goal: Ability to manage health-related needs will improve Outcome: Adequate for Discharge   Problem: Clinical Measurements: Goal: Ability to maintain clinical measurements within normal limits will improve Outcome: Adequate for Discharge Goal: Will remain free from infection Outcome: Adequate for Discharge Goal: Diagnostic test results will improve Outcome: Adequate for Discharge Goal: Respiratory complications will improve Outcome: Adequate for Discharge Goal: Cardiovascular complication will be avoided Outcome: Adequate for Discharge   Problem: Activity: Goal: Risk for activity intolerance will decrease Outcome: Adequate for Discharge   Problem: Nutrition: Goal: Adequate nutrition will be maintained Outcome: Adequate for Discharge   Problem: Coping: Goal: Level of anxiety will decrease Outcome: Adequate for Discharge   Problem: Elimination: Goal: Will not experience complications related to bowel motility Outcome: Adequate for Discharge Goal: Will not experience complications related to urinary retention Outcome: Adequate for Discharge   Problem: Pain Managment: Goal: General experience of comfort will improve Outcome: Adequate for Discharge   Problem: Safety: Goal: Ability to remain free from injury will improve Outcome: Adequate for Discharge   Problem: Skin Integrity: Goal: Risk for impaired skin integrity will decrease Outcome: Adequate for Discharge

## 2020-11-04 NOTE — Progress Notes (Addendum)
Patient ID: Laurie James [name], female   DOB: 06/06/1945, 75 y.o.   MRN: [id number]     Advanced Heart Failure Rounding Note  PCP-Cardiologist: Charlton Haws, MD  AHF: Dr. Gala Romney   Patient Profile   75 y/o woman with severe COPD, blindness. No known prior cardiac history. Poor baseline functional capacity over the past 2 years.  Admitted 6/22 with new onset AF. EF normal RV dilated. Cath with nonobstructive CAD. Sent home with [facility] and rate control w/ plans for outpatient DCCV after 3 weeks of a/c.   Readmitted 7/22 with acute HF. TEE EF 20% RV moderately down RA massively dilated. Heavy smoke in RA/LA and aortic root. DCCV canceled.    Developed shock with elevated LFTs. Moved to ICU.   PICC placed. Initial Co-ox 38%. Started on milrinone.   Subjective:   Remains in SR.   7/26 Off milrinone CO-OX 48.5%  Wants to go home. Denies SOB.   Objective:   Weight Range: 45.1 kg Body mass index is 22.29 kg/m.   Vital Signs:   Temp:  [97.9 F (36.6 C)-98.5 F (36.9 C)] 97.9 F (36.6 C) (07/28 0413) Pulse Rate:  [55-73] 57 (07/28 0500) Resp:  [15-19] 18 (07/28 0500) BP: (119-138)/(69-77) 119/75 (07/28 0413) SpO2:  [95 %-98 %] 97 % (07/28 0500) Weight:  [45.1 kg] 45.1 kg (07/28 0500) Last BM Date: 10/30/20  Weight change: Filed Weights   11/02/20 0311 11/03/20 0347 11/04/20 0500  Weight: 45.4 kg 45.3 kg 45.1 kg    Intake/Output:   Intake/Output Summary (Last 24 hours) at 11/04/2020 0717 Last data filed at 11/04/2020 0521 Gross per 24 hour  Intake --  Output 900 ml  Net -900 ml      Physical Exam  CVP not hooked up General:  Elderly. No resp difficulty HEENT: normal Neck: supple. no JVD. Carotids 2+ bilat; no bruits. No lymphadenopathy or thryomegaly appreciated. Cor: PMI nondisplaced. Regular rate & rhythm. No rubs, gallops or murmurs. Lungs: clear Abdomen: soft, nontender, nondistended. No hepatosplenomegaly. No bruits or masses. Good bowel  sounds. Extremities: no cyanosis, clubbing, rash, edema. RUE PICC  Neuro: alert & orientedx3, cranial nerves grossly intact. moves all 4 extremities w/o difficulty. Affect pleasant  Telemetry   SR/SB 50-60s. Personally reviewed.    Labs    CBC Recent Labs    11/02/20 0548 11/04/20 0431  WBC 8.0 7.2  HGB 10.8* 10.8*  HCT 32.6* 33.6*  MCV 96.4 98.2  PLT 180 183   Basic Metabolic Panel Recent Labs    35/32/99 0405 11/04/20 0431  NA 131* 131*  K 4.6 4.6  CL 95* 96*  CO2 31 29  GLUCOSE 86 76  BUN 21 24*  CREATININE 0.72 0.75  CALCIUM 8.3* 8.2*   Liver Function Tests Recent Labs    11/03/20 0405 11/04/20 0431  AST 91* 78*  ALT 155* 137*  ALKPHOS 176* 157*  BILITOT 1.0 1.2  PROT 5.7* 5.7*  ALBUMIN 2.2* 2.2*   No results for input(s): LIPASE, AMYLASE in the last 72 hours. Cardiac Enzymes No results for input(s): CKTOTAL, CKMB, CKMBINDEX, TROPONINI in the last 72 hours.  BNP: BNP (last 3 results) Recent Labs    10/25/20 0641 10/26/20 0639 10/27/20 0455  BNP 1,137.5* 990.2* 1,027.1*    ProBNP (last 3 results) No results for input(s): PROBNP in the last 8760 hours.   D-Dimer No results for input(s): DDIMER in the last 72 hours. Hemoglobin A1C No results for input(s): HGBA1C in the last 72  hours. Fasting Lipid Panel No results for input(s): CHOL, HDL, LDLCALC, TRIG, CHOLHDL, LDLDIRECT in the last 72 hours. Thyroid Function Tests No results for input(s): TSH, T4TOTAL, T3FREE, THYROIDAB in the last 72 hours.  Invalid input(s): FREET3   Other results:   Imaging    No results found.   Medications:     Scheduled Medications:  amiodarone  200 mg Oral BID   apixaban  5 mg Oral BID   atorvastatin  40 mg Oral Daily   Chlorhexidine Gluconate Cloth  6 each Topical Daily   citalopram  20 mg Oral QHS   dapagliflozin propanediol  10 mg Oral Daily   digoxin  0.0625 mg Oral Daily   feeding supplement  237 mL Oral TID BM   furosemide  20 mg Oral  Daily   multivitamin with minerals  1 tablet Oral Daily   nystatin  5 mL Oral QID   pantoprazole  40 mg Oral BID   sodium chloride flush  10-40 mL Intracatheter Q12H   spironolactone  25 mg Oral Daily    Infusions:  sodium chloride      PRN Medications: lidocaine (PF), melatonin, metoprolol tartrate, ondansetron (ZOFRAN) IV, polyethylene glycol     Assessment/Plan    Acute HFrEF/ Nonischemic cardiomyopathy - Echo on 7/18 revealed an EF of 25 to 30 percent, decreased from 55 to 60 percent on 10/06/2020 - LHC 10/07/2020 revealed mild 40% segmental RCA stenosis as well as 50 to 60% second diagonal branch stenosis - Suspect etiology of newly reduced EF is atrial fibrillation - DCCV planned for 7/18 deferred due to concern for LAA thrombus/severe smoke - Now off milrinone with co-ox 48.5% .  - Volume status stable. With addition of entresto will change lasix to as needed for lower extremity edema. She wont be able to weight daily due blindness.  -BP higher off milrinone. Start entresto 24-26 mg twice a day - Continue spiro to 25 mg daily.   - Digoxin was restarted 0.0625 mg daily, level < 0.2.  - Beta blocker on hold due to shock and bradycardia - Continue dapagliflozin 10 mg daily.  - Plan to repeat ECHO in 3 months.   2. Atrial fibrillation - Diltiazem started earlier this admission, but stopped due to finding of newly reduced EF. Initially on metoprolol for rate control, but stopped due to concern for shock  - Planned DCCV deferred due to concern for LAA thrombus - Chemically converted to SR. Now on amio 200 mg twice a day. Continue and cut back at office visit if still in SR.  - She is on Eliquis with LAA thrombus/smoke. No further hematochezia.   - No further BRBPR/melena, heparin stopped and eliquis started.   3. Cardiogenic shock - TEE on 7/18 with considerable "smoke" indicating low output with EF of 25 to 30 percent - Initial Co-ox 38%, started on Milrinone  - Co-ox  48.5%  off milrinone.    4. Left atrial appendage thrombus/heavy smoke - Noted on TEE on 7/18 - Now on eliquis.     5. CAD - LHC as noted above: 40% segmental mid RCA stenosis as well as 50 to 60% segmental second diagonal branch stenosis all of which can be treated medically - Beta blocker on hold due to shock.  - Continue atorvastatin.     6. Transaminitis - Surgery and GI consulted -- both believe etiology is congestive hepatopathy due to heart failure - LFTs continue to trend down.  Back on statin.  -  HIDA negative for cholecystitis - Started on Unasyn for cholecystitis; however, HIDA negative and WBC trending down. C/w ceftriaxone (last dose 7/22)    7. Pulmonary hypertension - Echo shows severely dilated atria - Optimize from volume standpoint - Can address medically once cardiogenic shock is stabilized    8. COPD - Not currently exacerbated - Flutter valve and incentive spirometry ordered   9. Right pleural effusion - s/p US-guided thoracentesis on 7/13 - CXR on 7/16 shows some reaccumulation - Currently denies SOB and is not hypoxic  - Repeat CXR 7/21 w/ small bilateral pleural effusions    10. Hematuria - mild gross hematuria in purewick. Now resolved.   11. Hematochezia - hgb stable 10.8   - IWas on IV PPI but transitioned to po protonix.  - monitor for recurrent bleeding, no bleeding. .  - Given no further hematochezia, she is back on Eliquis.   12. Hyponatremia - Sodium 131.  - free water restrict  13. Severe protein-calorie malnutrition - dietary consult - Family bringing in food.   14. Physical deconditioning - PT/OT  15. GOC - she is very frail.  HF meds for d/c  Amiodarone 200 mg twice a day  Entresto 24-26 mg twice a day  Spironolactone 25 mg daily Apixaban 5 mg twice a day -- CO-Pay $47.00 - 30 day free card given  Atorvastatin 40 mg daily  Farxiga 10 mg daily CO-Pay $47.00 - 30 day free card given  Lasix 20 mg daily as needed for lower  extremity edema.   HH set up.   HF follow up set up.   Length of Stay: 16  Amy Clegg, NP  11/04/2020, 7:17 AM  Advanced Heart Failure Team Pager [phone] (M-F; 7a - 5p)  Please contact [facility] for night-coverage after hours (5p -7a ) and weekends on [email or web address]   Agree with the above NP note.   BP higher, CVP 5 on last measure.  She remains in NSR.  Wants to go home.   Agree with restarting Entresto 24/26 bid today.  With Rande Brunt use, can use Lasix prn.  Continue amiodarone 200 mg bid until followup.   See above med list for discharge.  Close followup CHF clinic.   Marca Ancona 11/04/2020

## 2020-11-06 DIAGNOSIS — J449 Chronic obstructive pulmonary disease, unspecified: Secondary | ICD-10-CM | POA: Diagnosis not present

## 2020-11-06 DIAGNOSIS — H3530 Unspecified macular degeneration: Secondary | ICD-10-CM | POA: Diagnosis not present

## 2020-11-06 DIAGNOSIS — R413 Other amnesia: Secondary | ICD-10-CM | POA: Diagnosis not present

## 2020-11-06 DIAGNOSIS — S81812D Laceration without foreign body, left lower leg, subsequent encounter: Secondary | ICD-10-CM | POA: Diagnosis not present

## 2020-11-06 DIAGNOSIS — I110 Hypertensive heart disease with heart failure: Secondary | ICD-10-CM | POA: Diagnosis not present

## 2020-11-06 DIAGNOSIS — I5033 Acute on chronic diastolic (congestive) heart failure: Secondary | ICD-10-CM | POA: Diagnosis not present

## 2020-11-06 DIAGNOSIS — M810 Age-related osteoporosis without current pathological fracture: Secondary | ICD-10-CM | POA: Diagnosis not present

## 2020-11-06 DIAGNOSIS — Z9181 History of falling: Secondary | ICD-10-CM | POA: Diagnosis not present

## 2020-11-06 DIAGNOSIS — F32A Depression, unspecified: Secondary | ICD-10-CM | POA: Diagnosis not present

## 2020-11-06 DIAGNOSIS — H543 Unqualified visual loss, both eyes: Secondary | ICD-10-CM | POA: Diagnosis not present

## 2020-11-06 DIAGNOSIS — I428 Other cardiomyopathies: Secondary | ICD-10-CM | POA: Diagnosis not present

## 2020-11-06 DIAGNOSIS — Z87891 Personal history of nicotine dependence: Secondary | ICD-10-CM | POA: Diagnosis not present

## 2020-11-06 DIAGNOSIS — E43 Unspecified severe protein-calorie malnutrition: Secondary | ICD-10-CM | POA: Diagnosis not present

## 2020-11-06 DIAGNOSIS — K219 Gastro-esophageal reflux disease without esophagitis: Secondary | ICD-10-CM | POA: Diagnosis not present

## 2020-11-06 DIAGNOSIS — I272 Pulmonary hypertension, unspecified: Secondary | ICD-10-CM | POA: Diagnosis not present

## 2020-11-06 DIAGNOSIS — K761 Chronic passive congestion of liver: Secondary | ICD-10-CM | POA: Diagnosis not present

## 2020-11-06 DIAGNOSIS — E538 Deficiency of other specified B group vitamins: Secondary | ICD-10-CM | POA: Diagnosis not present

## 2020-11-06 DIAGNOSIS — E785 Hyperlipidemia, unspecified: Secondary | ICD-10-CM | POA: Diagnosis not present

## 2020-11-06 DIAGNOSIS — I5021 Acute systolic (congestive) heart failure: Secondary | ICD-10-CM | POA: Diagnosis not present

## 2020-11-06 DIAGNOSIS — I071 Rheumatic tricuspid insufficiency: Secondary | ICD-10-CM | POA: Diagnosis not present

## 2020-11-06 DIAGNOSIS — Z4800 Encounter for change or removal of nonsurgical wound dressing: Secondary | ICD-10-CM | POA: Diagnosis not present

## 2020-11-06 DIAGNOSIS — I4891 Unspecified atrial fibrillation: Secondary | ICD-10-CM | POA: Diagnosis not present

## 2020-11-06 DIAGNOSIS — I2510 Atherosclerotic heart disease of native coronary artery without angina pectoris: Secondary | ICD-10-CM | POA: Diagnosis not present

## 2020-11-09 DIAGNOSIS — I5021 Acute systolic (congestive) heart failure: Secondary | ICD-10-CM | POA: Diagnosis not present

## 2020-11-09 DIAGNOSIS — K219 Gastro-esophageal reflux disease without esophagitis: Secondary | ICD-10-CM | POA: Diagnosis not present

## 2020-11-09 DIAGNOSIS — I071 Rheumatic tricuspid insufficiency: Secondary | ICD-10-CM | POA: Diagnosis not present

## 2020-11-09 DIAGNOSIS — M810 Age-related osteoporosis without current pathological fracture: Secondary | ICD-10-CM | POA: Diagnosis not present

## 2020-11-09 DIAGNOSIS — R413 Other amnesia: Secondary | ICD-10-CM | POA: Diagnosis not present

## 2020-11-09 DIAGNOSIS — S81812D Laceration without foreign body, left lower leg, subsequent encounter: Secondary | ICD-10-CM | POA: Diagnosis not present

## 2020-11-09 DIAGNOSIS — J449 Chronic obstructive pulmonary disease, unspecified: Secondary | ICD-10-CM | POA: Diagnosis not present

## 2020-11-09 DIAGNOSIS — I2510 Atherosclerotic heart disease of native coronary artery without angina pectoris: Secondary | ICD-10-CM | POA: Diagnosis not present

## 2020-11-09 DIAGNOSIS — I4891 Unspecified atrial fibrillation: Secondary | ICD-10-CM | POA: Diagnosis not present

## 2020-11-09 DIAGNOSIS — Z4800 Encounter for change or removal of nonsurgical wound dressing: Secondary | ICD-10-CM | POA: Diagnosis not present

## 2020-11-09 DIAGNOSIS — I272 Pulmonary hypertension, unspecified: Secondary | ICD-10-CM | POA: Diagnosis not present

## 2020-11-09 DIAGNOSIS — H543 Unqualified visual loss, both eyes: Secondary | ICD-10-CM | POA: Diagnosis not present

## 2020-11-09 DIAGNOSIS — H3530 Unspecified macular degeneration: Secondary | ICD-10-CM | POA: Diagnosis not present

## 2020-11-09 DIAGNOSIS — Z87891 Personal history of nicotine dependence: Secondary | ICD-10-CM | POA: Diagnosis not present

## 2020-11-09 DIAGNOSIS — E538 Deficiency of other specified B group vitamins: Secondary | ICD-10-CM | POA: Diagnosis not present

## 2020-11-09 DIAGNOSIS — I5033 Acute on chronic diastolic (congestive) heart failure: Secondary | ICD-10-CM | POA: Diagnosis not present

## 2020-11-09 DIAGNOSIS — I110 Hypertensive heart disease with heart failure: Secondary | ICD-10-CM | POA: Diagnosis not present

## 2020-11-09 DIAGNOSIS — K761 Chronic passive congestion of liver: Secondary | ICD-10-CM | POA: Diagnosis not present

## 2020-11-09 DIAGNOSIS — Z9181 History of falling: Secondary | ICD-10-CM | POA: Diagnosis not present

## 2020-11-09 DIAGNOSIS — I428 Other cardiomyopathies: Secondary | ICD-10-CM | POA: Diagnosis not present

## 2020-11-09 DIAGNOSIS — E43 Unspecified severe protein-calorie malnutrition: Secondary | ICD-10-CM | POA: Diagnosis not present

## 2020-11-09 DIAGNOSIS — E785 Hyperlipidemia, unspecified: Secondary | ICD-10-CM | POA: Diagnosis not present

## 2020-11-09 DIAGNOSIS — F32A Depression, unspecified: Secondary | ICD-10-CM | POA: Diagnosis not present

## 2020-11-10 ENCOUNTER — Ambulatory Visit: Payer: Medicare Other | Admitting: Physician Assistant

## 2020-11-10 ENCOUNTER — Ambulatory Visit: Payer: Medicare Other | Admitting: Cardiovascular Disease

## 2020-11-10 NOTE — Progress Notes (Signed)
Patient ID: Zykera Abella [name], female   DOB: Apr 07, 1946, 75 y.o.   MRN: [id number]  Virtual Visit via Video Note   This visit type was conducted due to national recommendations for restrictions regarding the COVID-19 Pandemic (e.g. social distancing) in an effort to limit this patient's exposure and mitigate transmission in our community.  Due to her co-morbid illnesses, this patient is at least at moderate risk for complications without adequate follow up.  This format is felt to be most appropriate for this patient at this time.  All issues noted in this document were discussed and addressed.  A limited physical exam was performed with this format.  Please refer to the patient's chart for her consent to telehealth for [facility].   Patient location : Home Physician location: Office  75 y.o. with high calcium score and moderate CAD by cardiac CT in 2013. She had normal myovue in 2016 and again in 2019-10-03  Son in his 93's died last year of MI. He did not like to go to doctors. Son Fraser Din with her today and has daughter in town as well. She continues to smoke a ppd. Activity limited by balance issues and poor eye sight with macular degeneration   Hospitalized 06-25-22with chest pain and PAF TTE 10/06/20 EF 55-60% RVE estimated PA 44 severe bi atrial enlargement mild MR ctha 10/07/20 with tightest lesion only 60% D2 and 40% mid RCA TEE 10/25/20 EF down 20-25% ? LAA thrombus severe TR [facility] not done Seen by advanced CHF team Elevated LFTls AST/ALT 682/ 472 PIC line place coox 38% Started on milrinone and NE with gentle diuresis Converted to NSR on amiodarone D/c with Eliquis D/c CHF meds  Amiodarone 200 mg bid Entresto 24/26 Aldactone 25 mg Elquis 5 bid Lipitor 40 mg  Farxiga 10 mg  Lasix 20 mg as needed   Seeing Dr Jesusita Oka tomorrow and should have lab work   Doing well with no volume overload and seems to be  Maintaining sinus   ROS: Denies fever, malais, weight loss, blurry  vision, decreased visual acuity, cough, sputum, SOB, hemoptysis, pleuritic pain, palpitaitons, heartburn, abdominal pain, melena, lower extremity edema, claudication, or rash.  All other systems reviewed and negative  General:  BP (!) 81/51   Pulse 61   Ht 4\' 8"  (1.422 m)   Wt 41.9 kg   SpO2 97%   BMI 20.69 kg/m   No distress JVP mildly elevated No edema Elderly frail female No tachypnea  Medications Current Outpatient Medications  Medication Sig Dispense Refill   albuterol (VENTOLIN HFA) 108 (90 Base) MCG/ACT inhaler Inhale 2 puffs into the lungs every 4 (four) hours as needed for wheezing.     amiodarone (PACERONE) 200 MG tablet Take 1 tablet (200 mg total) by mouth 2 (two) times daily. 60 tablet 6   apixaban (ELIQUIS) 5 MG TABS tablet Take 1 tablet (5 mg total) by mouth 2 (two) times daily. 120 tablet 2   atorvastatin (LIPITOR) 40 MG tablet Take 1 tablet (40 mg total) by mouth daily. 60 tablet 2   citalopram (CELEXA) 20 MG tablet Take 20 mg by mouth at bedtime.     cyanocobalamin (,VITAMIN B-12,) 1000 MCG/ML injection Inject 1,000 mcg into the muscle every 30 (thirty) days.  3   dapagliflozin propanediol (FARXIGA) 10 MG TABS tablet Take 1 tablet (10 mg total) by mouth daily. 30 tablet 6   digoxin (LANOXIN) 0.125 MG tablet Take 0.5 tablets (0.0625 mg total)  by mouth daily. 30 tablet 6   furosemide (LASIX) 20 MG tablet Take 1 tablet (20 mg total) by mouth as needed. Leg edema 30 tablet 6   nitroGLYCERIN (NITROSTAT) 0.4 MG SL tablet Place 1 tablet (0.4 mg total) under the tongue every 5 (five) minutes x 3 doses as needed for chest pain. 25 tablet 0   pantoprazole (PROTONIX) 40 MG tablet Take 1 tablet (40 mg total) by mouth 2 (two) times daily. 60 tablet 6   promethazine (PHENERGAN) 25 MG tablet Take 25 mg by mouth 3 (three) times daily as needed for nausea.  1   sacubitril-valsartan (ENTRESTO) 24-26 MG Take 1 tablet by mouth 2 (two) times daily. 60 tablet 6   spironolactone  (ALDACTONE) 25 MG tablet Take 1 tablet (25 mg total) by mouth daily. 30 tablet 6   No current facility-administered medications for this visit.    Allergies Codeine  Family History: Family History  Problem Relation Age of Onset   Breast cancer Sister    Breast cancer Sister     Social History: Social History   Socioeconomic History   Marital status: Married    Spouse name: Not on file   Number of children: Not on file   Years of education: Not on file   Highest education level: Not on file  Occupational History   Not on file  Tobacco Use   Smoking status: Every Day    Packs/day: 1.00    Years: 50.00    Pack years: 50.00    Types: Cigarettes   Smokeless tobacco: Never  Substance and Sexual Activity   Alcohol use: No    Alcohol/week: 0.0 standard drinks    Comment: rarely   Drug use: No   Sexual activity: Not on file  Other Topics Concern   Not on file  Social History Narrative   Not on file   Social Determinants of Health   Financial Resource Strain: Not on file  Food Insecurity: Not on file  Transportation Needs: No Transportation Needs   Lack of Transportation (Medical): No   Lack of Transportation (Non-Medical): No  Physical Activity: Not on file  Stress: Not on file  Social Connections: Not on file  Intimate Partner Violence: Not on file    Past Surgical History:  Procedure Laterality Date   ABDOMINAL HYSTERECTOMY     IR THORACENTESIS ASP PLEURAL SPACE W/IMG GUIDE  10/20/2020   RIGHT/LEFT HEART CATH AND CORONARY ANGIOGRAPHY N/A 10/07/2020   Procedure: RIGHT/LEFT HEART CATH AND CORONARY ANGIOGRAPHY;  Surgeon: Runell Gess, MD;  Location: [facility] INVASIVE CV LAB;  Service: Cardiovascular;  Laterality: N/A;   TEE WITHOUT CARDIOVERSION N/A 10/25/2020   Procedure: TRANSESOPHAGEAL ECHOCARDIOGRAM (TEE);  Surgeon: Quintella Reichert, MD;  Location: [facility] ENDOSCOPY;  Service: Cardiovascular;  Laterality: N/A;    Past Medical History:  Diagnosis Date   AMD (age  related macular degeneration)    COPD (chronic obstructive pulmonary disease) (HCC)    Depression    Hiatal hernia    Intermittent low back pain    Memory impairment    Midsternal chest pain    a. 12/2011 Cardiac CTA Ca++ score of 103.3 (80th %), LAD <50p/m, RCA 50-75.   Osteoporosis    Vitamin B 12 deficiency     Electrocardiogram:  12/23/11  SR rate 94 nonspecific ST changes  06/30/16 SR rate 102 PAC nonspecific ST/T wave changes 07/28/19 SR rate 84 nonspecific lateral T wave changes   Assessment and Plan  1. CAD:  moderate D2 non-obstructive elsewhere cath 10/07/20  2. COPD with WHO 2/3 pulmonary HTN f/u pulmonary d/c smoking  3. Afib:  with shock/bradycardia NSR on amiodarone continue eliquis 4. DCM:  EF 20-25% needing NE/milrinone in hospital continue entresto, farxiga Aldactone labs today maintenance of NSR important f/u advanced CHF team  F/U echo October hopefully will improve in NSR   F/U with DR Bensimohn tomorrow  Time: spent reviewing records from recent hospitalization cath, echo d/c summary direct patient interview and composing note 25 mintues   Charlton Haws MD [facility]

## 2020-11-11 ENCOUNTER — Telehealth (INDEPENDENT_AMBULATORY_CARE_PROVIDER_SITE_OTHER): Payer: Medicare Other | Admitting: Cardiovascular Disease

## 2020-11-11 ENCOUNTER — Other Ambulatory Visit: Payer: Self-pay

## 2020-11-11 VITALS — BP 108/62 | HR 67 | Ht <= 58 in | Wt 92.3 lb

## 2020-11-11 DIAGNOSIS — I5021 Acute systolic (congestive) heart failure: Secondary | ICD-10-CM

## 2020-11-11 DIAGNOSIS — E785 Hyperlipidemia, unspecified: Secondary | ICD-10-CM | POA: Diagnosis not present

## 2020-11-11 DIAGNOSIS — M810 Age-related osteoporosis without current pathological fracture: Secondary | ICD-10-CM | POA: Diagnosis not present

## 2020-11-11 DIAGNOSIS — I480 Paroxysmal atrial fibrillation: Secondary | ICD-10-CM | POA: Diagnosis not present

## 2020-11-11 DIAGNOSIS — I110 Hypertensive heart disease with heart failure: Secondary | ICD-10-CM | POA: Diagnosis not present

## 2020-11-11 DIAGNOSIS — J449 Chronic obstructive pulmonary disease, unspecified: Secondary | ICD-10-CM | POA: Diagnosis not present

## 2020-11-11 DIAGNOSIS — K761 Chronic passive congestion of liver: Secondary | ICD-10-CM | POA: Diagnosis not present

## 2020-11-11 DIAGNOSIS — S81812D Laceration without foreign body, left lower leg, subsequent encounter: Secondary | ICD-10-CM | POA: Diagnosis not present

## 2020-11-11 DIAGNOSIS — Z4800 Encounter for change or removal of nonsurgical wound dressing: Secondary | ICD-10-CM | POA: Diagnosis not present

## 2020-11-11 DIAGNOSIS — E538 Deficiency of other specified B group vitamins: Secondary | ICD-10-CM | POA: Diagnosis not present

## 2020-11-11 DIAGNOSIS — K219 Gastro-esophageal reflux disease without esophagitis: Secondary | ICD-10-CM | POA: Diagnosis not present

## 2020-11-11 DIAGNOSIS — I428 Other cardiomyopathies: Secondary | ICD-10-CM | POA: Diagnosis not present

## 2020-11-11 DIAGNOSIS — Z9181 History of falling: Secondary | ICD-10-CM | POA: Diagnosis not present

## 2020-11-11 DIAGNOSIS — I5033 Acute on chronic diastolic (congestive) heart failure: Secondary | ICD-10-CM | POA: Diagnosis not present

## 2020-11-11 DIAGNOSIS — H3530 Unspecified macular degeneration: Secondary | ICD-10-CM | POA: Diagnosis not present

## 2020-11-11 DIAGNOSIS — H543 Unqualified visual loss, both eyes: Secondary | ICD-10-CM | POA: Diagnosis not present

## 2020-11-11 DIAGNOSIS — Z87891 Personal history of nicotine dependence: Secondary | ICD-10-CM | POA: Diagnosis not present

## 2020-11-11 DIAGNOSIS — Z7901 Long term (current) use of anticoagulants: Secondary | ICD-10-CM | POA: Diagnosis not present

## 2020-11-11 DIAGNOSIS — I4891 Unspecified atrial fibrillation: Secondary | ICD-10-CM | POA: Diagnosis not present

## 2020-11-11 DIAGNOSIS — I071 Rheumatic tricuspid insufficiency: Secondary | ICD-10-CM | POA: Diagnosis not present

## 2020-11-11 DIAGNOSIS — F32A Depression, unspecified: Secondary | ICD-10-CM | POA: Diagnosis not present

## 2020-11-11 DIAGNOSIS — R413 Other amnesia: Secondary | ICD-10-CM | POA: Diagnosis not present

## 2020-11-11 DIAGNOSIS — I272 Pulmonary hypertension, unspecified: Secondary | ICD-10-CM | POA: Diagnosis not present

## 2020-11-11 DIAGNOSIS — I2510 Atherosclerotic heart disease of native coronary artery without angina pectoris: Secondary | ICD-10-CM | POA: Diagnosis not present

## 2020-11-11 DIAGNOSIS — E43 Unspecified severe protein-calorie malnutrition: Secondary | ICD-10-CM | POA: Diagnosis not present

## 2020-11-11 NOTE — Progress Notes (Signed)
Advanced Heart Failure Clinic Note    PCP: Georgann Housekeeper, MD PCP-Cardiologist: Charlton Haws, MD  HF Cardiologist: Dr. Gala Romney  HPI: Laurie James is a 75 y.o. woman with severe COPD, blindness, malnutrition/frailty, HTN, diastolic HF, with a recent diagnosis of systolic HF and atrial fibrillation.   Admitted 6/22 with new onset AF. EF normal and RV dilated. Cath with nonobstructive CAD. Sent home with [facility] and rate control w/ plans for outpatient DCCV after 3 weeks of a/c.   Readmitted 7/22 with acute HF and developed cardiogenic shock. TEE EF 20% RV moderately down RA massively dilated. Heavy smoke in RA/LA and aortic root. DCCV canceled. She was anticoagulated. Developed shock with elevated LFTs. Moved to ICU.  PICC placed. Initial Co-ox 38% and required milrinone. Surgery and GI consulted for elevated LFTs and felt this was due to congestive hepatopathy from HF. Did have some hematuria and hematochezia which resolved prior to discharge. Hemoglobin remained stable. Had right sided thoracentesis on 07/13 and subsequent CXRs showed some reaccumulation. Diuresed with IV lasix. Po lasix PRN at discharge.  Initiated on GDMT with Entresto, spiro, digoxin, and dapagliflozin. Med titration limited by hypotension. Discharged home with [facility] and received hospital bed/bedside comode/RW with seat; weight 99 lbs.  Today she returns for post hospitalization HF follow up with her daughter. Overall feeling fine but has not tried to do too much. Able to get around the house and complete ADLs. Still feels weak. Some SOB with activity.  Denies CP, dizziness, edema, or PND/Orthopnea. Appetite getting better. No fever or chills. Weight at home 94-97 pounds. Taking all medications. She has quit smoking. Daughter brought daily weight and BP log. Home BP ~low 90's/60-70s.  Cardiac Studies: - Echo (10/06/20): EF 55-60 %   - LHC (10/07/2020) showed mild 40% segmental RCA stenosis as well as 50 to 60% second diagonal  branch stenosis  - TEE (7/22): EF 20% RV moderately down RA massively dilated. Heavy smoke in RA/LA and aortic root.   Review of Systems: [y] = yes, [ ]  = no   General: Weight gain [ ] ; Weight loss [y]; Anorexia [ ] ; Fatigue [ ] ; Fever [ ] ; Chills [ ] ; Weakness [y]  Cardiac: Chest pain/pressure [ ] ; Resting SOB [ ] ; Exertional SOB [ ] ; Orthopnea [ ] ; Pedal Edema [ ] ; Palpitations [ ] ; Syncope [ ] ; Presyncope [ ] ; Paroxysmal nocturnal dyspnea[ ]   Pulmonary: Cough [ ] ; Wheezing[ ] ; Hemoptysis[ ] ; Sputum [ ] ; Snoring [ ]   GI: Vomiting[ ] ; Dysphagia[ ] ; Melena[ ] ; Hematochezia [ ] ; Heartburn[ ] ; Abdominal pain [ ] ; Constipation [ ] ; Diarrhea [ ] ; BRBPR [ ]   GU: Hematuria[ ] ; Dysuria [ ] ; Nocturia[ ]   Vascular: Pain in legs with walking [ ] ; Pain in feet with lying flat [ ] ; Non-healing sores [ ] ; Stroke [ ] ; TIA [ ] ; Slurred speech [ ] ;  Neuro: Headaches[ ] ; Vertigo[ ] ; Seizures[ ] ; Paresthesias[ ] ;Blurred vision [ ] ; Diplopia [ ] ; Vision changes [ ]   Ortho/Skin: Arthritis [ ] ; Joint pain [ ] ; Muscle pain [ ] ; Joint swelling [ ] ; Back Pain [ ] ; Rash [ ]   Psych: Depression[ ] ; Anxiety[ ]   Heme: Bleeding problems [ ] ; Clotting disorders [ ] ; Anemia [ ]   Endocrine: Diabetes [ ] ; Thyroid dysfunction[ ]    Past Medical History:  Diagnosis Date   AMD (age related macular degeneration)    COPD (chronic obstructive pulmonary disease) (HCC)    Depression    Hiatal hernia    Intermittent low back  pain    Memory impairment    Midsternal chest pain    a. 12/2011 Cardiac CTA Ca++ score of 103.3 (80th %), LAD <50p/m, RCA 50-75.   Osteoporosis    Vitamin B 12 deficiency     Current Outpatient Medications  Medication Sig Dispense Refill   albuterol (VENTOLIN HFA) 108 (90 Base) MCG/ACT inhaler Inhale 2 puffs into the lungs every 4 (four) hours as needed for wheezing.     amiodarone (PACERONE) 200 MG tablet Take 1 tablet (200 mg total) by mouth 2 (two) times daily. 60 tablet 6   apixaban (ELIQUIS) 5 MG  TABS tablet Take 1 tablet (5 mg total) by mouth 2 (two) times daily. 120 tablet 2   atorvastatin (LIPITOR) 40 MG tablet Take 1 tablet (40 mg total) by mouth daily. 60 tablet 2   cyanocobalamin (,VITAMIN B-12,) 1000 MCG/ML injection Inject 1,000 mcg into the muscle every 30 (thirty) days.  3   dapagliflozin propanediol (FARXIGA) 10 MG TABS tablet Take 1 tablet (10 mg total) by mouth daily. 30 tablet 6   digoxin (LANOXIN) 0.125 MG tablet Take 0.5 tablets (0.0625 mg total) by mouth daily. 30 tablet 6   furosemide (LASIX) 20 MG tablet Take 1 tablet (20 mg total) by mouth as needed. Leg edema 30 tablet 6   nitroGLYCERIN (NITROSTAT) 0.4 MG SL tablet Place 1 tablet (0.4 mg total) under the tongue every 5 (five) minutes x 3 doses as needed for chest pain. 25 tablet 0   pantoprazole (PROTONIX) 40 MG tablet Take 1 tablet (40 mg total) by mouth 2 (two) times daily. 60 tablet 6   promethazine (PHENERGAN) 25 MG tablet Take 25 mg by mouth 3 (three) times daily as needed for nausea.  1   sacubitril-valsartan (ENTRESTO) 24-26 MG Take 1 tablet by mouth 2 (two) times daily. 60 tablet 6   spironolactone (ALDACTONE) 25 MG tablet Take 1 tablet (25 mg total) by mouth daily. 30 tablet 6   No current facility-administered medications for this encounter.   Allergies  Allergen Reactions   Codeine Nausea And Vomiting   Social History   Socioeconomic History   Marital status: Married    Spouse name: Not on file   Number of children: Not on file   Years of education: Not on file   Highest education level: Not on file  Occupational History   Not on file  Tobacco Use   Smoking status: Every Day    Packs/day: 1.00    Years: 50.00    Pack years: 50.00    Types: Cigarettes   Smokeless tobacco: Never  Substance and Sexual Activity   Alcohol use: No    Alcohol/week: 0.0 standard drinks    Comment: rarely   Drug use: No   Sexual activity: Not on file  Other Topics Concern   Not on file  Social History  Narrative   Not on file   Social Determinants of Health   Financial Resource Strain: Not on file  Food Insecurity: Not on file  Transportation Needs: No Transportation Needs   Lack of Transportation (Medical): No   Lack of Transportation (Non-Medical): No  Physical Activity: Not on file  Stress: Not on file  Social Connections: Not on file  Intimate Partner Violence: Not on file   Family History  Problem Relation Age of Onset   Breast cancer Sister    Breast cancer Sister    BP 90/63   Pulse 72   Wt 42.5 kg (93 lb  12.8 oz)   BMI 21.03 kg/m   Wt Readings from Last 3 Encounters:  11/12/20 42.5 kg (93 lb 12.8 oz)  11/11/20 41.9 kg (92 lb 4.8 oz)  11/04/20 45.1 kg (99 lb 6.8 oz)   PHYSICAL EXAM: General:  NAD. No resp difficulty, elderly, frail-appearing HEENT: + temporal wasting. Neck: Supple. No JVD. Carotids 2+ bilat; no bruits. No lymphadenopathy or thryomegaly appreciated. Cor: PMI nondisplaced. Regular rate & rhythm. No rubs, gallops or murmurs. Lungs: Diminished BS throughout. Abdomen: Soft, nontender, nondistended. No hepatosplenomegaly. No bruits or masses. Good bowel sounds. Extremities: No cyanosis, clubbing, rash, edema Neuro: Alert & oriented x 3, cranial nerves grossly intact. Moves all 4 extremities w/o difficulty. Affect pleasant. Tremulous hands.  ECG: Junctional rhythm 53 bpm (personally reviewed).  ASSESSMENT & PLAN: Chronic systolic Heart Failure Nonischemic cardiomyopathy - Echo (10/06/20): EF 55-60 %  - LHC (10/07/2020) showed mild 40% segmental RCA stenosis as well as 50 to 60% second diagonal branch stenosis - TEE on (10/25/20) with considerable "smoke" indicating low output w/ EF 25-30% - Suspect etiology of newly reduced EF is atrial fibrillation. - DCCV deferred due to concern for LAA thrombus/severe "smoke" - Volume status stable, possibly a little dry.  - Decrease spiro to 12.5 mg and change to qhs with low BPs. - Continue Entresto 24-26 mg  bid. - Continue lasix 20 mg prn LE edema. - Continue Digoxin 0.0625 mg daily. - Continue dapagliflozin 10 mg daily.  - Beta blocker on hold due to recent shock and bradycardia. - Plan to repeat ECHO in 3 months.  - Labs today.   2. Atrial fibrillation - She is junctional rhythm today on ECG. - Decrease amio to 200 mg daily. - She is on Eliquis with LAA thrombus/smoke.  - No further BRBPR/melena. - CBC today.   3. Left atrial appendage thrombus/heavy smoke - Noted on TEE on 10/25/20 - On Eliquis.   4. CAD - LHC (10/07/2020): mild 40% segmental RCA stenosis as well as 50 to 60% second diagonal branch stenosis - No CP. - No beta blocker yet. - Continue atorvastatin.     5. Transaminitis - Likely congestive hepatopathy due to heart failure. - HIDA negative for cholecystitis. - Started on Unasyn for cholecystitis; however, HIDA negative.  - Continue statin. - LFTs today.   6. Pulmonary hypertension - Echo showed severely dilated atria. - Likely WHO group II and III. - Optimize from volume standpoint. - Can address medically.   7. COPD/tobacco use - Not currently exacerbated. - Continue inhalers. Followed by pulmonary. - She has quit smoking for past 6 weeks.   8. Right pleural effusion - s/p US-guided thoracentesis on 10/20/20. - CXR on 7/16 shows some reaccumulation. - Repeat CXR 10/28/20 w/ small bilateral pleural effusions. - Currently denies SOB, w/ diminished BS on exam.  9. Severe protein-calorie malnutrition - Continue Ensure.   10. Physical deconditioning - HH PT/OT starting on Monday.  She has received co-pay cards for Eliquis ($47) and [place] ($47).   Follow up w/ PharmD in 3-4 weeks (if BP remains low, may need to switch Entresto to losartan); and APP in 6-8 weeks for further medication titration. Will plan to repeat echo in 3 months.  Anderson Malta Knox, FNP 11/12/20

## 2020-11-11 NOTE — Patient Instructions (Signed)
Medication Instructions:  *If you need a refill on your cardiac medications before your next appointment, please call your pharmacy*  Lab Work: If you have labs (blood work) drawn today and your tests are completely normal, you will receive your results only by: MyChart Message (if you have MyChart) OR A paper copy in the mail If you have any lab test that is abnormal or we need to change your treatment, we will call you to review the results.  Testing/Procedures: None ordered today.  Follow-Up: At [facility], you and your health needs are our priority.  As part of our continuing mission to provide you with exceptional heart care, we have created designated Provider Care Teams.  These Care Teams include your primary Cardiologist (physician) and Advanced Practice Providers (APPs -  Physician Assistants and Nurse Practitioners) who all work together to provide you with the care you need, when you need it.  We recommend signing up for the patient portal called "MyChart".  Sign up information is provided on this After Visit Summary.  MyChart is used to connect with patients for Virtual Visits (Telemedicine).  Patients are able to view lab/test results, encounter notes, upcoming appointments, etc.  Non-urgent messages can be sent to your provider as well.   To learn more about what you can do with MyChart, go to [email or web address].    Your next appointment:   6 month(s)  The format for your next appointment:   In Person  Provider:   You may see Peter Nishan, MD or one of the following Advanced Practice Providers on your designated Care Team:   Laura Friedli, NP  

## 2020-11-12 ENCOUNTER — Encounter (HOSPITAL_COMMUNITY): Payer: Self-pay

## 2020-11-12 ENCOUNTER — Ambulatory Visit (HOSPITAL_COMMUNITY)
Admit: 2020-11-12 | Discharge: 2020-11-12 | Disposition: A | Discharge status: Home / Self Care | Payer: Medicare Other | Source: Ambulatory Visit | Attending: Family Medicine | Admitting: Family Medicine

## 2020-11-12 ENCOUNTER — Other Ambulatory Visit: Payer: Self-pay

## 2020-11-12 VITALS — BP 90/63 | HR 72 | Wt 93.8 lb

## 2020-11-12 DIAGNOSIS — Z79899 Other long term (current) drug therapy: Secondary | ICD-10-CM | POA: Insufficient documentation

## 2020-11-12 DIAGNOSIS — E43 Unspecified severe protein-calorie malnutrition: Secondary | ICD-10-CM

## 2020-11-12 DIAGNOSIS — Z7984 Long term (current) use of oral hypoglycemic drugs: Secondary | ICD-10-CM | POA: Insufficient documentation

## 2020-11-12 DIAGNOSIS — I513 Intracardiac thrombosis, not elsewhere classified: Secondary | ICD-10-CM

## 2020-11-12 DIAGNOSIS — J449 Chronic obstructive pulmonary disease, unspecified: Secondary | ICD-10-CM | POA: Diagnosis not present

## 2020-11-12 DIAGNOSIS — Z7901 Long term (current) use of anticoagulants: Secondary | ICD-10-CM | POA: Diagnosis not present

## 2020-11-12 DIAGNOSIS — Z87891 Personal history of nicotine dependence: Secondary | ICD-10-CM

## 2020-11-12 DIAGNOSIS — I428 Other cardiomyopathies: Secondary | ICD-10-CM | POA: Insufficient documentation

## 2020-11-12 DIAGNOSIS — I110 Hypertensive heart disease with heart failure: Secondary | ICD-10-CM | POA: Diagnosis not present

## 2020-11-12 DIAGNOSIS — R54 Age-related physical debility: Secondary | ICD-10-CM | POA: Diagnosis not present

## 2020-11-12 DIAGNOSIS — R0602 Shortness of breath: Secondary | ICD-10-CM | POA: Diagnosis not present

## 2020-11-12 DIAGNOSIS — R7401 Elevation of levels of liver transaminase levels: Secondary | ICD-10-CM

## 2020-11-12 DIAGNOSIS — I2510 Atherosclerotic heart disease of native coronary artery without angina pectoris: Secondary | ICD-10-CM | POA: Diagnosis not present

## 2020-11-12 DIAGNOSIS — I272 Pulmonary hypertension, unspecified: Secondary | ICD-10-CM | POA: Diagnosis not present

## 2020-11-12 DIAGNOSIS — I5022 Chronic systolic (congestive) heart failure: Secondary | ICD-10-CM

## 2020-11-12 DIAGNOSIS — I5032 Chronic diastolic (congestive) heart failure: Secondary | ICD-10-CM | POA: Diagnosis not present

## 2020-11-12 DIAGNOSIS — Z09 Encounter for follow-up examination after completed treatment for conditions other than malignant neoplasm: Secondary | ICD-10-CM | POA: Insufficient documentation

## 2020-11-12 DIAGNOSIS — J90 Pleural effusion, not elsewhere classified: Secondary | ICD-10-CM | POA: Diagnosis not present

## 2020-11-12 DIAGNOSIS — I4891 Unspecified atrial fibrillation: Secondary | ICD-10-CM

## 2020-11-12 DIAGNOSIS — R5381 Other malaise: Secondary | ICD-10-CM | POA: Diagnosis not present

## 2020-11-12 LAB — COMPREHENSIVE METABOLIC PANEL
ALT: 43 U/L (ref 0–44)
AST: 29 U/L (ref 15–41)
Albumin: 2.9 g/dL — ABNORMAL LOW (ref 3.5–5.0)
Alkaline Phosphatase: 132 U/L — ABNORMAL HIGH (ref 38–126)
Anion gap: 7 (ref 5–15)
BUN: 20 mg/dL (ref 8–23)
CO2: 25 mmol/L (ref 22–32)
Calcium: 8.7 mg/dL — ABNORMAL LOW (ref 8.9–10.3)
Chloride: 101 mmol/L (ref 98–111)
Creatinine, Ser: 1.23 mg/dL — ABNORMAL HIGH (ref 0.44–1.00)
GFR, Estimated: 46 mL/min — ABNORMAL LOW (ref >60)
Glucose, Bld: 114 mg/dL — ABNORMAL HIGH (ref 70–99)
Potassium: 5.4 mmol/L — ABNORMAL HIGH (ref 3.5–5.1)
Sodium: 133 mmol/L — ABNORMAL LOW (ref 135–145)
Total Bilirubin: 1 mg/dL (ref 0.3–1.2)
Total Protein: 6.8 g/dL (ref 6.5–8.1)

## 2020-11-12 LAB — CBC
HCT: 37.7 % (ref 36.0–46.0)
Hemoglobin: 12.5 g/dL (ref 12.0–15.0)
MCH: 33.1 pg (ref 26.0–34.0)
MCHC: 33.2 g/dL (ref 30.0–36.0)
MCV: 99.7 fL (ref 80.0–100.0)
Platelets: 296 10*3/uL (ref 150–400)
RBC: 3.78 MIL/uL — ABNORMAL LOW (ref 3.87–5.11)
RDW: 16.8 % — ABNORMAL HIGH (ref 11.5–15.5)
WBC: 8 10*3/uL (ref 4.0–10.5)
nRBC: 0 % (ref 0.0–0.2)

## 2020-11-12 LAB — BRAIN NATRIURETIC PEPTIDE: B Natriuretic Peptide: 1979.1 pg/mL — ABNORMAL HIGH (ref 0.0–100.0)

## 2020-11-12 MED ORDER — AMIODARONE HCL 200 MG PO TABS: 200.0000 mg | ORAL_TABLET | Freq: Every day | ORAL | 6 refills | Status: DC

## 2020-11-12 MED ORDER — SPIRONOLACTONE 25 MG PO TABS: 12.5000 mg | ORAL_TABLET | Freq: Every day | ORAL | 6 refills | Status: DC

## 2020-11-12 NOTE — Patient Instructions (Signed)
Decrease amiodarone to 200 mg Daily  Decrease Spironolactone to 12.5 mg (1/2 tab) Daily AT NIGHT  Labs done today, we will call you for abnormal results  Please follow up with our heart failure pharmacist in 3-4 weeks  Your physician recommends that you schedule a follow-up appointment in:  6-8 weeks  Do the following things EVERYDAY: Weigh yourself in the morning before breakfast. Write it down and keep it in a log. Take your medicines as prescribed Eat low salt foods--Limit salt (sodium) to 2000 mg per day.  Stay as active as you can everyday Limit all fluids for the day to less than 2 liters  If you have any questions or concerns before your next appointment please send [place] a message through [place] or call our office at [phone].    TO LEAVE A MESSAGE FOR THE NURSE SELECT OPTION 2, PLEASE LEAVE A MESSAGE INCLUDING: YOUR NAME DATE OF BIRTH CALL BACK NUMBER REASON FOR CALL**this is important as we prioritize the call backs  YOU WILL RECEIVE A CALL BACK THE SAME DAY AS LONG AS YOU CALL BEFORE 4:00 PM  milAt the [facility], you and your health needs are our priority. As part of our continuing mission to provide you with exceptional heart care, we have created designated Provider Care Teams. These Care Teams include your primary Cardiologist (physician) and Advanced Practice Providers (APPs- Physician Assistants and Nurse Practitioners) who all work together to provide you with the care you need, when you need it.   You may see any of the following providers on your designated Care Team at your next follow up: Dr Arvilla Meres Dr Marca Ancona Dr Brandon Melnick, NP Robbie Lis, [place] Mikki Santee Karle Plumber, PharmD   Please be sure to bring in all your medications bottles to every appointment.

## 2020-11-13 LAB — DIGOXIN LEVEL: Digoxin Level: 1.2 ng/mL (ref 0.8–2.0)

## 2020-11-14 DIAGNOSIS — M810 Age-related osteoporosis without current pathological fracture: Secondary | ICD-10-CM | POA: Diagnosis not present

## 2020-11-14 DIAGNOSIS — I110 Hypertensive heart disease with heart failure: Secondary | ICD-10-CM | POA: Diagnosis not present

## 2020-11-14 DIAGNOSIS — S81812D Laceration without foreign body, left lower leg, subsequent encounter: Secondary | ICD-10-CM | POA: Diagnosis not present

## 2020-11-14 DIAGNOSIS — E538 Deficiency of other specified B group vitamins: Secondary | ICD-10-CM | POA: Diagnosis not present

## 2020-11-14 DIAGNOSIS — E43 Unspecified severe protein-calorie malnutrition: Secondary | ICD-10-CM | POA: Diagnosis not present

## 2020-11-14 DIAGNOSIS — H543 Unqualified visual loss, both eyes: Secondary | ICD-10-CM | POA: Diagnosis not present

## 2020-11-14 DIAGNOSIS — I428 Other cardiomyopathies: Secondary | ICD-10-CM | POA: Diagnosis not present

## 2020-11-14 DIAGNOSIS — Z4800 Encounter for change or removal of nonsurgical wound dressing: Secondary | ICD-10-CM | POA: Diagnosis not present

## 2020-11-14 DIAGNOSIS — E785 Hyperlipidemia, unspecified: Secondary | ICD-10-CM | POA: Diagnosis not present

## 2020-11-14 DIAGNOSIS — I071 Rheumatic tricuspid insufficiency: Secondary | ICD-10-CM | POA: Diagnosis not present

## 2020-11-14 DIAGNOSIS — F32A Depression, unspecified: Secondary | ICD-10-CM | POA: Diagnosis not present

## 2020-11-14 DIAGNOSIS — Z87891 Personal history of nicotine dependence: Secondary | ICD-10-CM | POA: Diagnosis not present

## 2020-11-14 DIAGNOSIS — K761 Chronic passive congestion of liver: Secondary | ICD-10-CM | POA: Diagnosis not present

## 2020-11-14 DIAGNOSIS — J449 Chronic obstructive pulmonary disease, unspecified: Secondary | ICD-10-CM | POA: Diagnosis not present

## 2020-11-14 DIAGNOSIS — I5021 Acute systolic (congestive) heart failure: Secondary | ICD-10-CM | POA: Diagnosis not present

## 2020-11-14 DIAGNOSIS — I5033 Acute on chronic diastolic (congestive) heart failure: Secondary | ICD-10-CM | POA: Diagnosis not present

## 2020-11-14 DIAGNOSIS — I4891 Unspecified atrial fibrillation: Secondary | ICD-10-CM | POA: Diagnosis not present

## 2020-11-14 DIAGNOSIS — K219 Gastro-esophageal reflux disease without esophagitis: Secondary | ICD-10-CM | POA: Diagnosis not present

## 2020-11-14 DIAGNOSIS — I2510 Atherosclerotic heart disease of native coronary artery without angina pectoris: Secondary | ICD-10-CM | POA: Diagnosis not present

## 2020-11-14 DIAGNOSIS — I272 Pulmonary hypertension, unspecified: Secondary | ICD-10-CM | POA: Diagnosis not present

## 2020-11-14 DIAGNOSIS — R413 Other amnesia: Secondary | ICD-10-CM | POA: Diagnosis not present

## 2020-11-14 DIAGNOSIS — H3530 Unspecified macular degeneration: Secondary | ICD-10-CM | POA: Diagnosis not present

## 2020-11-14 DIAGNOSIS — Z9181 History of falling: Secondary | ICD-10-CM | POA: Diagnosis not present

## 2020-11-17 DIAGNOSIS — E43 Unspecified severe protein-calorie malnutrition: Secondary | ICD-10-CM | POA: Diagnosis not present

## 2020-11-17 DIAGNOSIS — E538 Deficiency of other specified B group vitamins: Secondary | ICD-10-CM | POA: Diagnosis not present

## 2020-11-17 DIAGNOSIS — M810 Age-related osteoporosis without current pathological fracture: Secondary | ICD-10-CM | POA: Diagnosis not present

## 2020-11-17 DIAGNOSIS — F32A Depression, unspecified: Secondary | ICD-10-CM | POA: Diagnosis not present

## 2020-11-17 DIAGNOSIS — J449 Chronic obstructive pulmonary disease, unspecified: Secondary | ICD-10-CM | POA: Diagnosis not present

## 2020-11-17 DIAGNOSIS — I110 Hypertensive heart disease with heart failure: Secondary | ICD-10-CM | POA: Diagnosis not present

## 2020-11-17 DIAGNOSIS — I071 Rheumatic tricuspid insufficiency: Secondary | ICD-10-CM | POA: Diagnosis not present

## 2020-11-17 DIAGNOSIS — H3530 Unspecified macular degeneration: Secondary | ICD-10-CM | POA: Diagnosis not present

## 2020-11-17 DIAGNOSIS — I428 Other cardiomyopathies: Secondary | ICD-10-CM | POA: Diagnosis not present

## 2020-11-17 DIAGNOSIS — Z87891 Personal history of nicotine dependence: Secondary | ICD-10-CM | POA: Diagnosis not present

## 2020-11-17 DIAGNOSIS — R413 Other amnesia: Secondary | ICD-10-CM | POA: Diagnosis not present

## 2020-11-17 DIAGNOSIS — I2510 Atherosclerotic heart disease of native coronary artery without angina pectoris: Secondary | ICD-10-CM | POA: Diagnosis not present

## 2020-11-17 DIAGNOSIS — I5021 Acute systolic (congestive) heart failure: Secondary | ICD-10-CM | POA: Diagnosis not present

## 2020-11-17 DIAGNOSIS — Z9181 History of falling: Secondary | ICD-10-CM | POA: Diagnosis not present

## 2020-11-17 DIAGNOSIS — H543 Unqualified visual loss, both eyes: Secondary | ICD-10-CM | POA: Diagnosis not present

## 2020-11-17 DIAGNOSIS — K219 Gastro-esophageal reflux disease without esophagitis: Secondary | ICD-10-CM | POA: Diagnosis not present

## 2020-11-17 DIAGNOSIS — E785 Hyperlipidemia, unspecified: Secondary | ICD-10-CM | POA: Diagnosis not present

## 2020-11-17 DIAGNOSIS — Z4800 Encounter for change or removal of nonsurgical wound dressing: Secondary | ICD-10-CM | POA: Diagnosis not present

## 2020-11-17 DIAGNOSIS — K761 Chronic passive congestion of liver: Secondary | ICD-10-CM | POA: Diagnosis not present

## 2020-11-17 DIAGNOSIS — S81812D Laceration without foreign body, left lower leg, subsequent encounter: Secondary | ICD-10-CM | POA: Diagnosis not present

## 2020-11-17 DIAGNOSIS — I5033 Acute on chronic diastolic (congestive) heart failure: Secondary | ICD-10-CM | POA: Diagnosis not present

## 2020-11-17 DIAGNOSIS — I4891 Unspecified atrial fibrillation: Secondary | ICD-10-CM | POA: Diagnosis not present

## 2020-11-17 DIAGNOSIS — I272 Pulmonary hypertension, unspecified: Secondary | ICD-10-CM | POA: Diagnosis not present

## 2020-11-19 ENCOUNTER — Ambulatory Visit (HOSPITAL_COMMUNITY)
Admission: RE | Admit: 2020-11-19 | Discharge: 2020-11-19 | Disposition: A | Discharge status: Home / Self Care | Payer: Medicare Other | Source: Ambulatory Visit | Attending: Family Medicine | Admitting: Family Medicine

## 2020-11-19 ENCOUNTER — Telehealth (HOSPITAL_COMMUNITY): Payer: Self-pay | Admitting: Cardiology

## 2020-11-19 ENCOUNTER — Other Ambulatory Visit (HOSPITAL_COMMUNITY): Payer: Self-pay | Admitting: Family Medicine

## 2020-11-19 ENCOUNTER — Other Ambulatory Visit: Payer: Self-pay

## 2020-11-19 DIAGNOSIS — I5022 Chronic systolic (congestive) heart failure: Secondary | ICD-10-CM | POA: Insufficient documentation

## 2020-11-19 LAB — BASIC METABOLIC PANEL
Anion gap: 8 (ref 5–15)
BUN: 31 mg/dL — ABNORMAL HIGH (ref 8–23)
CO2: 24 mmol/L (ref 22–32)
Calcium: 8.8 mg/dL — ABNORMAL LOW (ref 8.9–10.3)
Chloride: 103 mmol/L (ref 98–111)
Creatinine, Ser: 1.12 mg/dL — ABNORMAL HIGH (ref 0.44–1.00)
GFR, Estimated: 51 mL/min — ABNORMAL LOW (ref >60)
Glucose, Bld: 120 mg/dL — ABNORMAL HIGH (ref 70–99)
Potassium: 5.6 mmol/L — ABNORMAL HIGH (ref 3.5–5.1)
Sodium: 135 mmol/L (ref 135–145)

## 2020-11-19 LAB — DIGOXIN LEVEL: Digoxin Level: 0.5 ng/mL — ABNORMAL LOW (ref 0.8–2.0)

## 2020-11-19 NOTE — Progress Notes (Signed)
During patients lab appt caretaker would like to notify provider, based on discharge summary some medications were taken incorrectly. Reports family had been following discharge summary and reports patient was receiving Sprio 25 mg BID and Farxiga 10 mg BID since discharge  Medication error was caught 11/12/20 and meds are currently farxiga 10 mg daily and spiro 25 daily  Med list updated. Will route to provider as fyi. Labs pending

## 2020-11-19 NOTE — Telephone Encounter (Signed)
Patient called.  Patient aware via daughter Tresa Endo [phone]. Repeat labs 8/18

## 2020-11-19 NOTE — Addendum Note (Signed)
Encounter addended by: Theresia Bough, CMA on: 11/19/2020 2:16 PM  Actions taken: Order Reconciliation Section accessed, Order list changed, Medication List reviewed, Home Medications modified, Medication taking status modified, Pharmacy for encounter modified, Clinical Note Signed

## 2020-11-19 NOTE — Telephone Encounter (Signed)
-----   Message from Jacklynn Ganong, [place] sent at 11/19/2020  3:29 PM EDT ----- Potassium too  high. Stop spiro. Dig level better. Repeat BMET in 7 days

## 2020-11-22 DIAGNOSIS — N1330 Unspecified hydronephrosis: Secondary | ICD-10-CM | POA: Diagnosis not present

## 2020-11-22 DIAGNOSIS — J3489 Other specified disorders of nose and nasal sinuses: Secondary | ICD-10-CM | POA: Diagnosis not present

## 2020-11-22 DIAGNOSIS — E538 Deficiency of other specified B group vitamins: Secondary | ICD-10-CM | POA: Diagnosis not present

## 2020-11-22 DIAGNOSIS — I270 Primary pulmonary hypertension: Secondary | ICD-10-CM | POA: Diagnosis not present

## 2020-11-22 DIAGNOSIS — I700 Atherosclerosis of aorta: Secondary | ICD-10-CM | POA: Diagnosis not present

## 2020-11-22 DIAGNOSIS — I5023 Acute on chronic systolic (congestive) heart failure: Secondary | ICD-10-CM | POA: Diagnosis not present

## 2020-11-22 DIAGNOSIS — E46 Unspecified protein-calorie malnutrition: Secondary | ICD-10-CM | POA: Diagnosis not present

## 2020-11-22 DIAGNOSIS — I513 Intracardiac thrombosis, not elsewhere classified: Secondary | ICD-10-CM | POA: Diagnosis not present

## 2020-11-22 DIAGNOSIS — R531 Weakness: Secondary | ICD-10-CM | POA: Diagnosis not present

## 2020-11-25 ENCOUNTER — Other Ambulatory Visit (HOSPITAL_COMMUNITY): Payer: Self-pay | Admitting: Cardiology

## 2020-11-25 ENCOUNTER — Ambulatory Visit (HOSPITAL_COMMUNITY)
Admission: RE | Admit: 2020-11-25 | Discharge: 2020-11-25 | Disposition: A | Discharge status: Home / Self Care | Payer: Medicare Other | Source: Ambulatory Visit | Attending: Internal Medicine | Admitting: Internal Medicine

## 2020-11-25 ENCOUNTER — Other Ambulatory Visit: Payer: Self-pay

## 2020-11-25 DIAGNOSIS — I5022 Chronic systolic (congestive) heart failure: Secondary | ICD-10-CM | POA: Insufficient documentation

## 2020-11-25 LAB — BASIC METABOLIC PANEL
Anion gap: 6 (ref 5–15)
BUN: 29 mg/dL — ABNORMAL HIGH (ref 8–23)
CO2: 24 mmol/L (ref 22–32)
Calcium: 8.4 mg/dL — ABNORMAL LOW (ref 8.9–10.3)
Chloride: 106 mmol/L (ref 98–111)
Creatinine, Ser: 0.99 mg/dL (ref 0.44–1.00)
GFR, Estimated: 59 mL/min — ABNORMAL LOW (ref >60)
Glucose, Bld: 110 mg/dL — ABNORMAL HIGH (ref 70–99)
Potassium: 4.7 mmol/L (ref 3.5–5.1)
Sodium: 136 mmol/L (ref 135–145)

## 2020-11-25 MED ORDER — DAPAGLIFLOZIN PROPANEDIOL 10 MG PO TABS: 10.0000 mg | ORAL_TABLET | Freq: Every day | ORAL | 6 refills | Status: DC

## 2020-11-26 DIAGNOSIS — Z9181 History of falling: Secondary | ICD-10-CM | POA: Diagnosis not present

## 2020-11-26 DIAGNOSIS — K761 Chronic passive congestion of liver: Secondary | ICD-10-CM | POA: Diagnosis not present

## 2020-11-26 DIAGNOSIS — Z4800 Encounter for change or removal of nonsurgical wound dressing: Secondary | ICD-10-CM | POA: Diagnosis not present

## 2020-11-26 DIAGNOSIS — K219 Gastro-esophageal reflux disease without esophagitis: Secondary | ICD-10-CM | POA: Diagnosis not present

## 2020-11-26 DIAGNOSIS — I428 Other cardiomyopathies: Secondary | ICD-10-CM | POA: Diagnosis not present

## 2020-11-26 DIAGNOSIS — I5033 Acute on chronic diastolic (congestive) heart failure: Secondary | ICD-10-CM | POA: Diagnosis not present

## 2020-11-26 DIAGNOSIS — M810 Age-related osteoporosis without current pathological fracture: Secondary | ICD-10-CM | POA: Diagnosis not present

## 2020-11-26 DIAGNOSIS — I4891 Unspecified atrial fibrillation: Secondary | ICD-10-CM | POA: Diagnosis not present

## 2020-11-26 DIAGNOSIS — S81812D Laceration without foreign body, left lower leg, subsequent encounter: Secondary | ICD-10-CM | POA: Diagnosis not present

## 2020-11-26 DIAGNOSIS — R413 Other amnesia: Secondary | ICD-10-CM | POA: Diagnosis not present

## 2020-11-26 DIAGNOSIS — H3530 Unspecified macular degeneration: Secondary | ICD-10-CM | POA: Diagnosis not present

## 2020-11-26 DIAGNOSIS — I5021 Acute systolic (congestive) heart failure: Secondary | ICD-10-CM | POA: Diagnosis not present

## 2020-11-26 DIAGNOSIS — Z87891 Personal history of nicotine dependence: Secondary | ICD-10-CM | POA: Diagnosis not present

## 2020-11-26 DIAGNOSIS — E538 Deficiency of other specified B group vitamins: Secondary | ICD-10-CM | POA: Diagnosis not present

## 2020-11-26 DIAGNOSIS — E43 Unspecified severe protein-calorie malnutrition: Secondary | ICD-10-CM | POA: Diagnosis not present

## 2020-11-26 DIAGNOSIS — F32A Depression, unspecified: Secondary | ICD-10-CM | POA: Diagnosis not present

## 2020-11-26 DIAGNOSIS — J449 Chronic obstructive pulmonary disease, unspecified: Secondary | ICD-10-CM | POA: Diagnosis not present

## 2020-11-26 DIAGNOSIS — I272 Pulmonary hypertension, unspecified: Secondary | ICD-10-CM | POA: Diagnosis not present

## 2020-11-26 DIAGNOSIS — I071 Rheumatic tricuspid insufficiency: Secondary | ICD-10-CM | POA: Diagnosis not present

## 2020-11-26 DIAGNOSIS — E785 Hyperlipidemia, unspecified: Secondary | ICD-10-CM | POA: Diagnosis not present

## 2020-11-26 DIAGNOSIS — I110 Hypertensive heart disease with heart failure: Secondary | ICD-10-CM | POA: Diagnosis not present

## 2020-11-26 DIAGNOSIS — H543 Unqualified visual loss, both eyes: Secondary | ICD-10-CM | POA: Diagnosis not present

## 2020-11-26 DIAGNOSIS — I2510 Atherosclerotic heart disease of native coronary artery without angina pectoris: Secondary | ICD-10-CM | POA: Diagnosis not present

## 2020-12-02 ENCOUNTER — Other Ambulatory Visit (HOSPITAL_COMMUNITY): Payer: Self-pay

## 2020-12-02 NOTE — Progress Notes (Signed)
PCP: Georgann Housekeeper, MD PCP-Cardiologist: Charlton Haws, MD  HF Cardiologist: Dr. Gala Romney  HPI:  Laurie James is a 75 y.o. woman with severe COPD, blindness, malnutrition/frailty, HTN, diastolic HF, with a recent diagnosis of systolic HF and atrial fibrillation.    Admitted 09/2020 with new onset AF. EF normal and RV dilated. Cath with nonobstructive CAD. Sent home with anticoagulation and rate control with plans for outpatient DCCV after 3 weeks of anticoagulation.   Readmitted 10/2020 with acute HF and developed cardiogenic shock. TEE EF 20% RV moderately down RA massively dilated. Heavy smoke in RA/LA and aortic root. DCCV cancelled. She was anticoagulated. Developed shock with elevated LFTs. Moved to ICU. PICC placed. Initial Co-ox 38% and required milrinone. Surgery and GI consulted for elevated LFTs and felt this was due to congestive hepatopathy from HF. Did have some hematuria and hematochezia which resolved prior to discharge. Hemoglobin remained stable. Had right sided thoracentesis on 10/20/20 and subsequent CXRs showed some reaccumulation. Diuresed with IV furosemide. Oral furosemide PRN at discharge.  Initiated on GDMT with Entresto, spironolactone, digoxin, and [place]. Med titration limited by hypotension. Discharged home with [facility] and received hospital bed/bedside comode/RW with seat; weight 99 lbs.   Recently presented to HF Clinic for post hospitalization HF follow up with her daughter. Overall was feeling fine but has not tried to do too much. Was able to get around the house and complete ADLs. Still felt weak. Some SOB with activity.  Denied CP, dizziness, edema, or PND/Orthopnea. Appetite was getting better. No fever or chills. Weight at home was 94-97 pounds. Reported taking all medications. She had quit smoking. Daughter brought daily weight and BP log. Home BP ~low 90's/60-70s.   Today she returns to HF clinic for pharmacist medication titration. At last visit with APP,  spironolactone was decreased to 12.5 mg nightly due to low BPs. Spironolactone was then discontinued for elevated potassium level of 5.6 on 11/19/20. Digoxin was decreased to 0.625 mg daily with level 1.2 ng/mL. Repeat level good at 0.5 ng/mL Amiodarone was decreased to 200 mg daily. Overall feeling good except for nausea which is not new, but has gotten worse over last 2 or 3 weeks. Has appointment with GI next week. Is currently using ondansetron almost daily for nausea which is helping. No dizziness since BP improved. Continues to feel fatigued. Home SBP 112 today per daughter who reports it has been consistently over 100 since last visit. Denies chest pain or palpitations. Able to ambulate around the house, feeds/bathes herself without help other than daughter getting her in/out of the shower. Working with PT and was able to walk around the house 11 times last week before stopping, previously only 3 times. Weight stable at home, ~97-99 lbs. Has not needed any of her PRN diuretic except 1-2 times since being home. Denies LEE/PND/orthopnea. Lungs clear on exam. Taking all medications as prescribed and tolerating all medications.   HF Medications: Entresto 24/26 mg BID Farxiga 10 mg daily Digoxin 0.0625 mg daily Furosemide 20 mg PRN  Has the patient been experiencing any side effects to the medications prescribed?  No, patient is tolerating all of her medicines well.   Does the patient have any problems obtaining medications due to transportation or finances? Has [facility] [employer]. Will apply for PAN grant for HF meds if household income is low enough. Patient will call and provide income information.  Understanding of regimen: good Understanding of indications: good Potential of compliance: good Patient understands to avoid NSAIDs. Patient  understands to avoid decongestants.   Pertinent Lab Values: 11/24/2020: Serum creatinine 0.99, BUN 29, Potassium 4.7, Sodium 136, Digoxin (11/19/2020) 0.5  ng/mL  Vital Signs: Weight: 99.6 lbs (last clinic weight: 93.8) Blood pressure: 98/58  Heart rate: 58   Assessment/Plan: Chronic systolic Heart Failure Nonischemic cardiomyopathy - Echo (10/06/20): EF 55-60%  - LHC (10/07/2020) showed mild 40% segmental RCA stenosis as well as 50 to 60% second diagonal branch stenosis - TEE on (10/25/20) with considerable "smoke" indicating low output w/ EF 25-30% - Suspect etiology of newly reduced EF is atrial fibrillation. - DCCV deferred due to concern for LAA thrombus/severe "smoke" - Volume status stable. - BP has improved, no medication changes today.  - Continue furosemide 20 mg PRN LE edema. - Continue Entresto 24-26 mg BID. - Continue Farxiga 10 mg daily.  - Continue Digoxin 0.0625 mg daily. - Beta blocker on hold due to recent shock and bradycardia.  - Spironolactone discontinued 11/19/20 with hyperkalemia. Can consider restarting at a later date if labs remain stable, patient was accidentally taking spironolactone twice daily when the elevated K level was noted. - Plan to repeat ECHO in 2 months.    2. Atrial fibrillation - She was in junctional rhythm 11/12/2020 on ECG. - Continue amiodarone 200 mg daily. - She is on Eliquis with LAA thrombus/smoke.  - No further BRBPR/melena.   3. Left atrial appendage thrombus/heavy smoke - Noted on TEE on 10/25/2020 - On Eliquis.   4. CAD - LHC (10/07/2020): mild 40% segmental RCA stenosis as well as 50 to 60% second diagonal branch stenosis - No chest pain. - No beta blocker yet. - Continue atorvastatin.     5. Transaminitis - Likely congestive hepatopathy due to heart failure. - HIDA negative for cholecystitis. - Started on Unasyn for cholecystitis; however, HIDA negative.  - Continue statin.   6. Pulmonary hypertension - Echo showed severely dilated atria. - Likely WHO group II and III. - Optimize from volume standpoint. - Can address medically.   7. COPD/tobacco use - Not currently  exacerbated. - Continue inhalers. Followed by pulmonary. - She has quit smoking   8. Right pleural effusion - s/p US-guided thoracentesis on 10/20/20. - CXR on 10/23/2020 shows some reaccumulation. - Repeat CXR 10/28/2020 w/ small bilateral pleural effusions.    9. Severe protein-calorie malnutrition - Continue Ensure.   10. Physical deconditioning - HH PT/OT started 11/2020  Follow-up on 01/07/21 with APP clinic for further medication titration.  Karle Plumber, PharmD, BCPS, BCCP, CPP Heart Failure Clinic Pharmacist [phone]

## 2020-12-03 DIAGNOSIS — J449 Chronic obstructive pulmonary disease, unspecified: Secondary | ICD-10-CM | POA: Diagnosis not present

## 2020-12-03 DIAGNOSIS — Z9181 History of falling: Secondary | ICD-10-CM | POA: Diagnosis not present

## 2020-12-03 DIAGNOSIS — F32A Depression, unspecified: Secondary | ICD-10-CM | POA: Diagnosis not present

## 2020-12-03 DIAGNOSIS — I110 Hypertensive heart disease with heart failure: Secondary | ICD-10-CM | POA: Diagnosis not present

## 2020-12-03 DIAGNOSIS — I4891 Unspecified atrial fibrillation: Secondary | ICD-10-CM | POA: Diagnosis not present

## 2020-12-03 DIAGNOSIS — I5021 Acute systolic (congestive) heart failure: Secondary | ICD-10-CM | POA: Diagnosis not present

## 2020-12-03 DIAGNOSIS — I428 Other cardiomyopathies: Secondary | ICD-10-CM | POA: Diagnosis not present

## 2020-12-03 DIAGNOSIS — E538 Deficiency of other specified B group vitamins: Secondary | ICD-10-CM | POA: Diagnosis not present

## 2020-12-03 DIAGNOSIS — I2510 Atherosclerotic heart disease of native coronary artery without angina pectoris: Secondary | ICD-10-CM | POA: Diagnosis not present

## 2020-12-03 DIAGNOSIS — M810 Age-related osteoporosis without current pathological fracture: Secondary | ICD-10-CM | POA: Diagnosis not present

## 2020-12-03 DIAGNOSIS — H3530 Unspecified macular degeneration: Secondary | ICD-10-CM | POA: Diagnosis not present

## 2020-12-03 DIAGNOSIS — H543 Unqualified visual loss, both eyes: Secondary | ICD-10-CM | POA: Diagnosis not present

## 2020-12-03 DIAGNOSIS — S81812D Laceration without foreign body, left lower leg, subsequent encounter: Secondary | ICD-10-CM | POA: Diagnosis not present

## 2020-12-03 DIAGNOSIS — E43 Unspecified severe protein-calorie malnutrition: Secondary | ICD-10-CM | POA: Diagnosis not present

## 2020-12-03 DIAGNOSIS — I272 Pulmonary hypertension, unspecified: Secondary | ICD-10-CM | POA: Diagnosis not present

## 2020-12-03 DIAGNOSIS — R413 Other amnesia: Secondary | ICD-10-CM | POA: Diagnosis not present

## 2020-12-03 DIAGNOSIS — K219 Gastro-esophageal reflux disease without esophagitis: Secondary | ICD-10-CM | POA: Diagnosis not present

## 2020-12-03 DIAGNOSIS — Z4800 Encounter for change or removal of nonsurgical wound dressing: Secondary | ICD-10-CM | POA: Diagnosis not present

## 2020-12-03 DIAGNOSIS — I5033 Acute on chronic diastolic (congestive) heart failure: Secondary | ICD-10-CM | POA: Diagnosis not present

## 2020-12-03 DIAGNOSIS — K761 Chronic passive congestion of liver: Secondary | ICD-10-CM | POA: Diagnosis not present

## 2020-12-03 DIAGNOSIS — E785 Hyperlipidemia, unspecified: Secondary | ICD-10-CM | POA: Diagnosis not present

## 2020-12-03 DIAGNOSIS — I071 Rheumatic tricuspid insufficiency: Secondary | ICD-10-CM | POA: Diagnosis not present

## 2020-12-03 DIAGNOSIS — Z87891 Personal history of nicotine dependence: Secondary | ICD-10-CM | POA: Diagnosis not present

## 2020-12-05 DIAGNOSIS — E43 Unspecified severe protein-calorie malnutrition: Secondary | ICD-10-CM | POA: Diagnosis not present

## 2020-12-08 DIAGNOSIS — S81812D Laceration without foreign body, left lower leg, subsequent encounter: Secondary | ICD-10-CM | POA: Diagnosis not present

## 2020-12-08 DIAGNOSIS — I428 Other cardiomyopathies: Secondary | ICD-10-CM | POA: Diagnosis not present

## 2020-12-08 DIAGNOSIS — E785 Hyperlipidemia, unspecified: Secondary | ICD-10-CM | POA: Diagnosis not present

## 2020-12-08 DIAGNOSIS — K761 Chronic passive congestion of liver: Secondary | ICD-10-CM | POA: Diagnosis not present

## 2020-12-08 DIAGNOSIS — I110 Hypertensive heart disease with heart failure: Secondary | ICD-10-CM | POA: Diagnosis not present

## 2020-12-08 DIAGNOSIS — K219 Gastro-esophageal reflux disease without esophagitis: Secondary | ICD-10-CM | POA: Diagnosis not present

## 2020-12-08 DIAGNOSIS — Z9181 History of falling: Secondary | ICD-10-CM | POA: Diagnosis not present

## 2020-12-08 DIAGNOSIS — F32A Depression, unspecified: Secondary | ICD-10-CM | POA: Diagnosis not present

## 2020-12-08 DIAGNOSIS — H543 Unqualified visual loss, both eyes: Secondary | ICD-10-CM | POA: Diagnosis not present

## 2020-12-08 DIAGNOSIS — I5021 Acute systolic (congestive) heart failure: Secondary | ICD-10-CM | POA: Diagnosis not present

## 2020-12-08 DIAGNOSIS — M810 Age-related osteoporosis without current pathological fracture: Secondary | ICD-10-CM | POA: Diagnosis not present

## 2020-12-08 DIAGNOSIS — I4891 Unspecified atrial fibrillation: Secondary | ICD-10-CM | POA: Diagnosis not present

## 2020-12-08 DIAGNOSIS — I2510 Atherosclerotic heart disease of native coronary artery without angina pectoris: Secondary | ICD-10-CM | POA: Diagnosis not present

## 2020-12-08 DIAGNOSIS — R413 Other amnesia: Secondary | ICD-10-CM | POA: Diagnosis not present

## 2020-12-08 DIAGNOSIS — Z87891 Personal history of nicotine dependence: Secondary | ICD-10-CM | POA: Diagnosis not present

## 2020-12-08 DIAGNOSIS — I272 Pulmonary hypertension, unspecified: Secondary | ICD-10-CM | POA: Diagnosis not present

## 2020-12-08 DIAGNOSIS — Z4800 Encounter for change or removal of nonsurgical wound dressing: Secondary | ICD-10-CM | POA: Diagnosis not present

## 2020-12-08 DIAGNOSIS — J449 Chronic obstructive pulmonary disease, unspecified: Secondary | ICD-10-CM | POA: Diagnosis not present

## 2020-12-08 DIAGNOSIS — H3530 Unspecified macular degeneration: Secondary | ICD-10-CM | POA: Diagnosis not present

## 2020-12-08 DIAGNOSIS — I5033 Acute on chronic diastolic (congestive) heart failure: Secondary | ICD-10-CM | POA: Diagnosis not present

## 2020-12-08 DIAGNOSIS — E538 Deficiency of other specified B group vitamins: Secondary | ICD-10-CM | POA: Diagnosis not present

## 2020-12-08 DIAGNOSIS — E43 Unspecified severe protein-calorie malnutrition: Secondary | ICD-10-CM | POA: Diagnosis not present

## 2020-12-08 DIAGNOSIS — I071 Rheumatic tricuspid insufficiency: Secondary | ICD-10-CM | POA: Diagnosis not present

## 2020-12-14 ENCOUNTER — Ambulatory Visit (HOSPITAL_COMMUNITY)
Admission: RE | Admit: 2020-12-14 | Discharge: 2020-12-14 | Disposition: A | Discharge status: Home / Self Care | Payer: Medicare Other | Source: Ambulatory Visit | Attending: Cardiology | Admitting: Cardiology

## 2020-12-14 ENCOUNTER — Other Ambulatory Visit: Payer: Self-pay

## 2020-12-14 VITALS — BP 98/58 | HR 58 | Wt 99.9 lb

## 2020-12-14 DIAGNOSIS — Z7901 Long term (current) use of anticoagulants: Secondary | ICD-10-CM | POA: Diagnosis not present

## 2020-12-14 DIAGNOSIS — R7401 Elevation of levels of liver transaminase levels: Secondary | ICD-10-CM | POA: Insufficient documentation

## 2020-12-14 DIAGNOSIS — I748 Embolism and thrombosis of other arteries: Secondary | ICD-10-CM | POA: Insufficient documentation

## 2020-12-14 DIAGNOSIS — I272 Pulmonary hypertension, unspecified: Secondary | ICD-10-CM | POA: Insufficient documentation

## 2020-12-14 DIAGNOSIS — J449 Chronic obstructive pulmonary disease, unspecified: Secondary | ICD-10-CM | POA: Insufficient documentation

## 2020-12-14 DIAGNOSIS — I110 Hypertensive heart disease with heart failure: Secondary | ICD-10-CM | POA: Insufficient documentation

## 2020-12-14 DIAGNOSIS — I2510 Atherosclerotic heart disease of native coronary artery without angina pectoris: Secondary | ICD-10-CM | POA: Insufficient documentation

## 2020-12-14 DIAGNOSIS — E43 Unspecified severe protein-calorie malnutrition: Secondary | ICD-10-CM | POA: Diagnosis not present

## 2020-12-14 DIAGNOSIS — I5022 Chronic systolic (congestive) heart failure: Secondary | ICD-10-CM | POA: Insufficient documentation

## 2020-12-14 DIAGNOSIS — Z87891 Personal history of nicotine dependence: Secondary | ICD-10-CM | POA: Diagnosis not present

## 2020-12-14 DIAGNOSIS — I6521 Occlusion and stenosis of right carotid artery: Secondary | ICD-10-CM | POA: Insufficient documentation

## 2020-12-14 DIAGNOSIS — I4891 Unspecified atrial fibrillation: Secondary | ICD-10-CM | POA: Diagnosis not present

## 2020-12-14 DIAGNOSIS — J90 Pleural effusion, not elsewhere classified: Secondary | ICD-10-CM | POA: Insufficient documentation

## 2020-12-14 DIAGNOSIS — I428 Other cardiomyopathies: Secondary | ICD-10-CM | POA: Insufficient documentation

## 2020-12-14 NOTE — Patient Instructions (Signed)
It was a pleasure seeing you today!  MEDICATIONS: -No medication changes today -Call if you have questions about your medications.   NEXT APPOINTMENT: Return to clinic in 3 weeks with APP Clinic.  In general, to take care of your heart failure: -Limit your fluid intake to 2 Liters (half-gallon) per day.   -Limit your salt intake to ideally 2-3 grams (2000-3000 mg) per day. -Weigh yourself daily and record, and bring that "weight diary" to your next appointment.  (Weight gain of 2-3 pounds in 1 day typically means fluid weight.) -The medications for your heart are to help your heart and help you live longer.   -Please contact us before stopping any of your heart medications.  Call the clinic at [phone] with questions or to reschedule future appointments.  

## 2020-12-15 ENCOUNTER — Other Ambulatory Visit: Payer: Self-pay | Admitting: Gastroenterology

## 2020-12-15 DIAGNOSIS — R1084 Generalized abdominal pain: Secondary | ICD-10-CM | POA: Diagnosis not present

## 2020-12-15 DIAGNOSIS — K5900 Constipation, unspecified: Secondary | ICD-10-CM | POA: Diagnosis not present

## 2020-12-15 DIAGNOSIS — R110 Nausea: Secondary | ICD-10-CM

## 2020-12-15 DIAGNOSIS — R140 Abdominal distension (gaseous): Secondary | ICD-10-CM

## 2020-12-15 DIAGNOSIS — R6881 Early satiety: Secondary | ICD-10-CM | POA: Diagnosis not present

## 2020-12-16 ENCOUNTER — Telehealth (HOSPITAL_COMMUNITY): Payer: Self-pay | Admitting: Pharmacist

## 2020-12-16 NOTE — Telephone Encounter (Signed)
Was successful in securing patient an $1000 grant from Patient [employer]) to provide copayment coverage for [employer].  This will keep the out of pocket expense at $0.    The billing information is as follows and has been shared with the pharmacy.   Member ID: [id number] Group ID: [id number] RxBin ID: [id number] PCN: [id number] Eligibility Start Date: 09/17/2020 Eligibility End Date: 12/15/2021 Assistance Amount: $1,000.00  Fund:  Heart Failure  Karle Plumber, PharmD, BCPS, BCCP, CPP Heart Failure Clinic Pharmacist [phone]

## 2020-12-27 ENCOUNTER — Other Ambulatory Visit (HOSPITAL_COMMUNITY): Payer: Self-pay

## 2020-12-29 ENCOUNTER — Other Ambulatory Visit (HOSPITAL_COMMUNITY): Payer: Self-pay

## 2020-12-29 DIAGNOSIS — I428 Other cardiomyopathies: Secondary | ICD-10-CM | POA: Diagnosis not present

## 2020-12-29 DIAGNOSIS — K761 Chronic passive congestion of liver: Secondary | ICD-10-CM | POA: Diagnosis not present

## 2020-12-29 DIAGNOSIS — R413 Other amnesia: Secondary | ICD-10-CM | POA: Diagnosis not present

## 2020-12-29 DIAGNOSIS — I110 Hypertensive heart disease with heart failure: Secondary | ICD-10-CM | POA: Diagnosis not present

## 2020-12-29 DIAGNOSIS — Z9181 History of falling: Secondary | ICD-10-CM | POA: Diagnosis not present

## 2020-12-29 DIAGNOSIS — S81812D Laceration without foreign body, left lower leg, subsequent encounter: Secondary | ICD-10-CM | POA: Diagnosis not present

## 2020-12-29 DIAGNOSIS — I5033 Acute on chronic diastolic (congestive) heart failure: Secondary | ICD-10-CM | POA: Diagnosis not present

## 2020-12-29 DIAGNOSIS — E785 Hyperlipidemia, unspecified: Secondary | ICD-10-CM | POA: Diagnosis not present

## 2020-12-29 DIAGNOSIS — H543 Unqualified visual loss, both eyes: Secondary | ICD-10-CM | POA: Diagnosis not present

## 2020-12-29 DIAGNOSIS — M810 Age-related osteoporosis without current pathological fracture: Secondary | ICD-10-CM | POA: Diagnosis not present

## 2020-12-29 DIAGNOSIS — E538 Deficiency of other specified B group vitamins: Secondary | ICD-10-CM | POA: Diagnosis not present

## 2020-12-29 DIAGNOSIS — I4891 Unspecified atrial fibrillation: Secondary | ICD-10-CM | POA: Diagnosis not present

## 2020-12-29 DIAGNOSIS — I5021 Acute systolic (congestive) heart failure: Secondary | ICD-10-CM | POA: Diagnosis not present

## 2020-12-29 DIAGNOSIS — Z4800 Encounter for change or removal of nonsurgical wound dressing: Secondary | ICD-10-CM | POA: Diagnosis not present

## 2020-12-29 DIAGNOSIS — K219 Gastro-esophageal reflux disease without esophagitis: Secondary | ICD-10-CM | POA: Diagnosis not present

## 2020-12-29 DIAGNOSIS — I2510 Atherosclerotic heart disease of native coronary artery without angina pectoris: Secondary | ICD-10-CM | POA: Diagnosis not present

## 2020-12-29 DIAGNOSIS — F32A Depression, unspecified: Secondary | ICD-10-CM | POA: Diagnosis not present

## 2020-12-29 DIAGNOSIS — Z87891 Personal history of nicotine dependence: Secondary | ICD-10-CM | POA: Diagnosis not present

## 2020-12-29 DIAGNOSIS — E43 Unspecified severe protein-calorie malnutrition: Secondary | ICD-10-CM | POA: Diagnosis not present

## 2020-12-29 DIAGNOSIS — J449 Chronic obstructive pulmonary disease, unspecified: Secondary | ICD-10-CM | POA: Diagnosis not present

## 2020-12-29 DIAGNOSIS — H3530 Unspecified macular degeneration: Secondary | ICD-10-CM | POA: Diagnosis not present

## 2020-12-29 DIAGNOSIS — I272 Pulmonary hypertension, unspecified: Secondary | ICD-10-CM | POA: Diagnosis not present

## 2020-12-29 DIAGNOSIS — I071 Rheumatic tricuspid insufficiency: Secondary | ICD-10-CM | POA: Diagnosis not present

## 2021-01-05 DIAGNOSIS — E43 Unspecified severe protein-calorie malnutrition: Secondary | ICD-10-CM | POA: Diagnosis not present

## 2021-01-07 ENCOUNTER — Encounter (HOSPITAL_COMMUNITY): Payer: Medicare Other

## 2021-01-10 ENCOUNTER — Ambulatory Visit
Admission: RE | Admit: 2021-01-10 | Discharge: 2021-01-10 | Disposition: A | Discharge status: Home / Self Care | Payer: Medicare Other | Source: Ambulatory Visit | Attending: Gastroenterology | Admitting: Gastroenterology

## 2021-01-10 ENCOUNTER — Other Ambulatory Visit: Payer: Self-pay

## 2021-01-10 DIAGNOSIS — K224 Dyskinesia of esophagus: Secondary | ICD-10-CM | POA: Diagnosis not present

## 2021-01-10 DIAGNOSIS — R1030 Lower abdominal pain, unspecified: Secondary | ICD-10-CM | POA: Diagnosis not present

## 2021-01-10 DIAGNOSIS — R140 Abdominal distension (gaseous): Secondary | ICD-10-CM

## 2021-01-10 DIAGNOSIS — R110 Nausea: Secondary | ICD-10-CM

## 2021-01-10 DIAGNOSIS — R6881 Early satiety: Secondary | ICD-10-CM

## 2021-01-10 DIAGNOSIS — R1084 Generalized abdominal pain: Secondary | ICD-10-CM

## 2021-01-25 NOTE — Progress Notes (Signed)
Advanced Heart Failure Clinic Note    PCP: Georgann Housekeeper, MD PCP-Cardiologist: Charlton Haws, MD  HF Cardiologist: Dr. Gala Romney  HPI: Laurie James is a 75 y.o. woman with severe COPD, blindness, malnutrition/frailty, HTN, diastolic HF, with a recent diagnosis of systolic HF and atrial fibrillation.   Admitted 6/22 with new onset AF. EF normal and RV dilated. Cath with nonobstructive CAD. Sent home with [facility] and rate control w/ plans for outpatient DCCV after 3 weeks of a/c.   Readmitted 7/22 with acute HF and developed cardiogenic shock. TEE EF 20% RV moderately down RA massively dilated. Heavy smoke in RA/LA and aortic root. DCCV canceled. She was anticoagulated. Developed shock with elevated LFTs. Moved to ICU.  PICC placed. Initial Co-ox 38% and required milrinone. Surgery and GI consulted for elevated LFTs and felt this was due to congestive hepatopathy from HF. Did have some hematuria and hematochezia which resolved prior to discharge. Hemoglobin remained stable. Had right sided thoracentesis on 07/13 and subsequent CXRs showed some reaccumulation. Diuresed with IV lasix. Po lasix PRN at discharge.  Initiated on GDMT with Entresto, spiro, digoxin, and dapagliflozin. Med titration limited by hypotension. Discharged home with [facility] and received hospital bed/bedside comode/RW with seat; weight 99 lbs.  Today she returns for HF follow up with her daughter. She is able to walk to the bathroom and the porch without SOB but otherwise not active. No falls. Denies CP, dizziness, palpitations, edema, abnormal bleeding, or PND/Orthopnea. Appetite ok. No fever or chills. Weight at home 104 pounds. Her son helps with her medications. No longer smokes.  Cardiac Studies: - Echo (10/06/20): EF 55-60 %   - LHC (10/07/2020) showed mild 40% segmental RCA stenosis as well as 50 to 60% second diagonal branch stenosis  - TEE (7/22): EF 20% RV moderately down RA massively dilated. Heavy smoke in RA/LA and aortic  root.   Past Medical History:  Diagnosis Date   AMD (age related macular degeneration)    COPD (chronic obstructive pulmonary disease) (HCC)    Depression    Hiatal hernia    Intermittent low back pain    Memory impairment    Midsternal chest pain    a. 12/2011 Cardiac CTA Ca++ score of 103.3 (80th %), LAD <50p/m, RCA 50-75.   Osteoporosis    Vitamin B 12 deficiency    Current Outpatient Medications  Medication Sig Dispense Refill   albuterol (VENTOLIN HFA) 108 (90 Base) MCG/ACT inhaler Inhale 2 puffs into the lungs every 4 (four) hours as needed for wheezing.     apixaban (ELIQUIS) 5 MG TABS tablet Take 1 tablet (5 mg total) by mouth 2 (two) times daily. 120 tablet 2   atorvastatin (LIPITOR) 40 MG tablet Take 1 tablet (40 mg total) by mouth daily. 60 tablet 2   cyanocobalamin (,VITAMIN B-12,) 1000 MCG/ML injection Inject 1,000 mcg into the muscle every 30 (thirty) days.  3   dapagliflozin propanediol (FARXIGA) 10 MG TABS tablet Take 1 tablet (10 mg total) by mouth daily. 30 tablet 6   digoxin (LANOXIN) 0.125 MG tablet Take 0.5 tablets (0.0625 mg total) by mouth daily. 30 tablet 6   furosemide (LASIX) 20 MG tablet Take 1 tablet (20 mg total) by mouth as needed. Leg edema 30 tablet 6   nitroGLYCERIN (NITROSTAT) 0.4 MG SL tablet Place 1 tablet (0.4 mg total) under the tongue every 5 (five) minutes x 3 doses as needed for chest pain. 25 tablet 0   ondansetron (ZOFRAN) 4 MG tablet Take  4 mg by mouth 3 (three) times daily as needed.     pantoprazole (PROTONIX) 40 MG tablet Take 1 tablet (40 mg total) by mouth 2 (two) times daily. 60 tablet 6   sacubitril-valsartan (ENTRESTO) 24-26 MG Take 1 tablet by mouth 2 (two) times daily. 60 tablet 6   amiodarone (PACERONE) 200 MG tablet Take 1 tablet (200 mg total) by mouth daily. (Patient not taking: Reported on 01/28/2021) 60 tablet 6   No current facility-administered medications for this encounter.   Allergies  Allergen Reactions   Codeine  Nausea And Vomiting   Social History   Socioeconomic History   Marital status: Married    Spouse name: Not on file   Number of children: Not on file   Years of education: Not on file   Highest education level: Not on file  Occupational History   Not on file  Tobacco Use   Smoking status: Every Day    Packs/day: 1.00    Years: 50.00    Pack years: 50.00    Types: Cigarettes   Smokeless tobacco: Never  Substance and Sexual Activity   Alcohol use: No    Alcohol/week: 0.0 standard drinks    Comment: rarely   Drug use: No   Sexual activity: Not on file  Other Topics Concern   Not on file  Social History Narrative   Not on file   Social Determinants of Health   Financial Resource Strain: Not on file  Food Insecurity: Not on file  Transportation Needs: No Transportation Needs   Lack of Transportation (Medical): No   Lack of Transportation (Non-Medical): No  Physical Activity: Not on file  Stress: Not on file  Social Connections: Not on file  Intimate Partner Violence: Not on file   Family History  Problem Relation Age of Onset   Breast cancer Sister    Breast cancer Sister    BP 102/78   Pulse 76   Wt 47.6 kg (105 lb)   SpO2 98%   BMI 23.54 kg/m   Wt Readings from Last 3 Encounters:  01/28/21 47.6 kg (105 lb)  12/14/20 45.3 kg (99 lb 13.9 oz)  11/12/20 42.5 kg (93 lb 12.8 oz)   PHYSICAL EXAM: General:  NAD. No resp difficulty, elderly, arrived in [facility], frail HEENT: Normal Neck: Supple. No JVD. Carotids 2+ bilat; no bruits. No lymphadenopathy or thryomegaly appreciated. Cor: PMI nondisplaced. Regular rate & rhythm. No rubs, gallops or murmurs. Lungs: Clear Abdomen: Soft, nontender, nondistended. No hepatosplenomegaly. No bruits or masses. Good bowel sounds. Extremities: No cyanosis, clubbing, rash, edema Neuro: Alert & oriented x 3, cranial nerves grossly intact. Moves all 4 extremities w/o difficulty. Affect pleasant.  ECG: Sinus rhythm 74 bpm (personally  reviewed).  ReDs: 26%  ASSESSMENT & PLAN: Chronic systolic Heart Failure Nonischemic cardiomyopathy - Echo (10/06/20): EF 55-60 %  - LHC (10/07/2020) showed mild 40% segmental RCA stenosis as well as 50 to 60% second diagonal branch stenosis - TEE on (10/25/20) with considerable "smoke" indicating low output w/ EF 25-30% - Suspect etiology of newly reduced EF is atrial fibrillation. - DCCV deferred due to concern for LAA thrombus/severe "smoke" - GDMT limited by low BP and hyperkalemia. - NYHA III, functional class confounded due to physical deconditioning.  - Volume status stable, ReDs 26% - Off spiro with elevated K, but was taking twice a day when K was high. May be able to re-challenge. - Continue Entresto 24-26 mg bid. - Continue lasix 20 mg prn  LE edema. - Continue digoxin 0.0625 mg daily. - Continue dapagliflozin 10 mg daily.  - Beta blocker on hold due to recent shock and bradycardia. No BP room to add today. - Labs today.   2. Atrial fibrillation - SR on ECG today. - She has been off her amiodarone for several weeks, ok to stop. - She is on Eliquis with LAA thrombus/smoke.  - No further BRBPR/melena.   3. Left atrial appendage thrombus/heavy smoke - Noted on TEE on 10/25/20 - On Eliquis.   4. CAD - LHC (10/07/2020): mild 40% segmental RCA stenosis as well as 50 to 60% second diagonal branch stenosis - No CP. - No BP room for beta blockade yet. - Continue atorvastatin.     5. Pulmonary hypertension - Echo showed severely dilated atria. - Likely WHO group II and III. - Optimize from volume standpoint. - Can address medically.   6. COPD/tobacco use - Continue inhalers. Followed by pulmonary. - She has quit smoking.   7. Right pleural effusion - s/p US-guided thoracentesis on 10/20/20. - CXR on 7/16 shows some reaccumulation. - Repeat CXR 10/28/20 w/ small bilateral pleural effusions. - O2 sats 98% on room air, BS clear on exam.   8. Physical deconditioning -  Continue HH PT/OT.   Follow up w/ Dr. Gala Romney + echo in 2 months.  [place], FNP 01/28/21

## 2021-01-28 ENCOUNTER — Other Ambulatory Visit: Payer: Self-pay

## 2021-01-28 ENCOUNTER — Encounter (HOSPITAL_COMMUNITY): Payer: Self-pay

## 2021-01-28 ENCOUNTER — Ambulatory Visit (HOSPITAL_COMMUNITY)
Admission: RE | Admit: 2021-01-28 | Discharge: 2021-01-28 | Disposition: A | Discharge status: Home / Self Care | Payer: Medicare Other | Source: Ambulatory Visit | Attending: Family Medicine | Admitting: Family Medicine

## 2021-01-28 ENCOUNTER — Encounter (HOSPITAL_COMMUNITY): Payer: Medicare Other

## 2021-01-28 VITALS — BP 102/78 | HR 76 | Wt 105.0 lb

## 2021-01-28 DIAGNOSIS — I513 Intracardiac thrombosis, not elsewhere classified: Secondary | ICD-10-CM

## 2021-01-28 DIAGNOSIS — E875 Hyperkalemia: Secondary | ICD-10-CM | POA: Diagnosis not present

## 2021-01-28 DIAGNOSIS — I4891 Unspecified atrial fibrillation: Secondary | ICD-10-CM | POA: Diagnosis not present

## 2021-01-28 DIAGNOSIS — J449 Chronic obstructive pulmonary disease, unspecified: Secondary | ICD-10-CM

## 2021-01-28 DIAGNOSIS — I5042 Chronic combined systolic (congestive) and diastolic (congestive) heart failure: Secondary | ICD-10-CM | POA: Diagnosis not present

## 2021-01-28 DIAGNOSIS — Z79899 Other long term (current) drug therapy: Secondary | ICD-10-CM | POA: Diagnosis not present

## 2021-01-28 DIAGNOSIS — I110 Hypertensive heart disease with heart failure: Secondary | ICD-10-CM | POA: Insufficient documentation

## 2021-01-28 DIAGNOSIS — I2510 Atherosclerotic heart disease of native coronary artery without angina pectoris: Secondary | ICD-10-CM

## 2021-01-28 DIAGNOSIS — Z7901 Long term (current) use of anticoagulants: Secondary | ICD-10-CM | POA: Diagnosis not present

## 2021-01-28 DIAGNOSIS — Z87891 Personal history of nicotine dependence: Secondary | ICD-10-CM

## 2021-01-28 DIAGNOSIS — I5022 Chronic systolic (congestive) heart failure: Secondary | ICD-10-CM | POA: Diagnosis not present

## 2021-01-28 DIAGNOSIS — Z885 Allergy status to narcotic agent status: Secondary | ICD-10-CM | POA: Insufficient documentation

## 2021-01-28 DIAGNOSIS — Z7984 Long term (current) use of oral hypoglycemic drugs: Secondary | ICD-10-CM | POA: Insufficient documentation

## 2021-01-28 DIAGNOSIS — R54 Age-related physical debility: Secondary | ICD-10-CM | POA: Diagnosis not present

## 2021-01-28 DIAGNOSIS — I272 Pulmonary hypertension, unspecified: Secondary | ICD-10-CM

## 2021-01-28 DIAGNOSIS — J90 Pleural effusion, not elsewhere classified: Secondary | ICD-10-CM | POA: Diagnosis not present

## 2021-01-28 DIAGNOSIS — R5381 Other malaise: Secondary | ICD-10-CM

## 2021-01-28 DIAGNOSIS — E46 Unspecified protein-calorie malnutrition: Secondary | ICD-10-CM | POA: Insufficient documentation

## 2021-01-28 LAB — BASIC METABOLIC PANEL
Anion gap: 9 (ref 5–15)
BUN: 20 mg/dL (ref 8–23)
CO2: 27 mmol/L (ref 22–32)
Calcium: 8.7 mg/dL — ABNORMAL LOW (ref 8.9–10.3)
Chloride: 101 mmol/L (ref 98–111)
Creatinine, Ser: 0.95 mg/dL (ref 0.44–1.00)
GFR, Estimated: >60 mL/min (ref >60)
Glucose, Bld: 78 mg/dL (ref 70–99)
Potassium: 4.4 mmol/L (ref 3.5–5.1)
Sodium: 137 mmol/L (ref 135–145)

## 2021-01-28 LAB — CBC
HCT: 38.8 % (ref 36.0–46.0)
Hemoglobin: 12.3 g/dL (ref 12.0–15.0)
MCH: 32.5 pg (ref 26.0–34.0)
MCHC: 31.7 g/dL (ref 30.0–36.0)
MCV: 102.4 fL — ABNORMAL HIGH (ref 80.0–100.0)
Platelets: 212 10*3/uL (ref 150–400)
RBC: 3.79 MIL/uL — ABNORMAL LOW (ref 3.87–5.11)
RDW: 16.1 % — ABNORMAL HIGH (ref 11.5–15.5)
WBC: 7.6 10*3/uL (ref 4.0–10.5)
nRBC: 0 % (ref 0.0–0.2)

## 2021-01-28 LAB — DIGOXIN LEVEL: Digoxin Level: 0.8 ng/mL (ref 0.8–2.0)

## 2021-01-28 NOTE — Progress Notes (Signed)
ReDS Vest / Clip - 01/28/21 1500       ReDS Vest / Clip   Station Marker B    Ruler Value 24    ReDS Value Range Low volume    ReDS Actual Value 26

## 2021-01-28 NOTE — Patient Instructions (Addendum)
Stay off Amiodarone  Labs done today, we will call you for abnormal results  Your physician recommends that you schedule a follow-up appointment in: 6 weeks with an echocardiogam  Do the following things EVERYDAY: Weigh yourself in the morning before breakfast. Write it down and keep it in a log. Take your medicines as prescribed Eat low salt foods--Limit salt (sodium) to 2000 mg per day.  Stay as active as you can everyday Limit all fluids for the day to less than 2 liters  If you have any questions or concerns before your next appointment please send [place] a message through [place] or call our office at [phone].    TO LEAVE A MESSAGE FOR THE NURSE SELECT OPTION 2, PLEASE LEAVE A MESSAGE INCLUDING: YOUR NAME DATE OF BIRTH CALL BACK NUMBER REASON FOR CALL**this is important as we prioritize the call backs  YOU WILL RECEIVE A CALL BACK THE SAME DAY AS LONG AS YOU CALL BEFORE 4:00 PM  At the Advanced Heart Failure Clinic, you and your health needs are our priority. As part of our continuing mission to provide you with exceptional heart care, we have created designated Provider Care Teams. These Care Teams include your primary Cardiologist (physician) and Advanced Practice Providers (APPs- Physician Assistants and Nurse Practitioners) who all work together to provide you with the care you need, when you need it.   You may see any of the following providers on your designated Care Team at your next follow up: Dr Arvilla Meres Dr Carron Curie, NP Robbie Lis, [place] [facility], [place] Karle Plumber, PharmD   Please be sure to bring in all your medications bottles to every appointment.

## 2021-02-02 ENCOUNTER — Telehealth (HOSPITAL_COMMUNITY): Payer: Self-pay

## 2021-02-02 DIAGNOSIS — I5022 Chronic systolic (congestive) heart failure: Secondary | ICD-10-CM

## 2021-02-02 NOTE — Telephone Encounter (Signed)
Patient advised and verbalized understanding,lab appointment scheduled,lab orders entered  Orders Placed This Encounter  Procedures   Digoxin level    Standing Status:   Future    Standing Expiration Date:   02/02/2022    Order Specific Question:   Release to patient    Answer:   Immediate

## 2021-02-02 NOTE — Telephone Encounter (Signed)
-----   Message from Jacklynn Ganong, [place] sent at 01/28/2021  7:03 PM EDT ----- Labs stable. Please repeat digoxin level in 2-3 weeks

## 2021-02-04 DIAGNOSIS — E43 Unspecified severe protein-calorie malnutrition: Secondary | ICD-10-CM | POA: Diagnosis not present

## 2021-02-17 ENCOUNTER — Ambulatory Visit (HOSPITAL_COMMUNITY)
Admission: RE | Admit: 2021-02-17 | Discharge: 2021-02-17 | Disposition: A | Discharge status: Home / Self Care | Payer: Medicare Other | Source: Ambulatory Visit | Attending: Cardiology | Admitting: Cardiology

## 2021-02-17 ENCOUNTER — Other Ambulatory Visit: Payer: Self-pay

## 2021-02-17 ENCOUNTER — Telehealth (HOSPITAL_COMMUNITY): Payer: Self-pay | Admitting: Pharmacy Technician

## 2021-02-17 DIAGNOSIS — I5022 Chronic systolic (congestive) heart failure: Secondary | ICD-10-CM | POA: Diagnosis not present

## 2021-02-17 LAB — DIGOXIN LEVEL: Digoxin Level: 0.9 ng/mL (ref 0.8–2.0)

## 2021-02-17 NOTE — Telephone Encounter (Signed)
Advanced Heart Failure Patient Advocate Encounter  Received a message that the patient was having issues with her grant. Called both the patient and her daughter, left message on both lines.   Upon further investigation, patient failed an income verification with PAN. Not sure if patient provided income or not to PAN. Will attempt to find out further once I speak with one of them.  Archer Asa, CPhT

## 2021-02-18 ENCOUNTER — Telehealth (HOSPITAL_COMMUNITY): Payer: Self-pay

## 2021-02-18 DIAGNOSIS — I5022 Chronic systolic (congestive) heart failure: Secondary | ICD-10-CM

## 2021-02-18 MED ORDER — DIGOXIN 125 MCG PO TABS: 0.0625 mg | ORAL_TABLET | ORAL | 6 refills | Status: DC

## 2021-02-18 NOTE — Telephone Encounter (Signed)
Patient advised and verbalized understanding. Lab appt scheduled, lab order entered. Med list updated to reflect changes.   Meds ordered this encounter  Medications   digoxin (LANOXIN) 0.125 MG tablet    Sig: Take 0.5 tablets (0.0625 mg total) by mouth every other day.    Dispense:  30 tablet    Refill:  6    Please cancel all previous orders for current medication. Change in dosage or pill size.   Orders Placed This Encounter  Procedures   Digoxin level    Standing Status:   Future    Standing Expiration Date:   02/18/2022    Order Specific Question:   Release to patient    Answer:   Immediate

## 2021-02-18 NOTE — Telephone Encounter (Signed)
-----   Message from Jacklynn Ganong, [place] sent at 02/18/2021  1:33 PM EST ----- Dig level remains elevated. Please take every other day, repeat dig level in 1 week please.

## 2021-02-24 DIAGNOSIS — H1013 Acute atopic conjunctivitis, bilateral: Secondary | ICD-10-CM | POA: Diagnosis not present

## 2021-02-25 ENCOUNTER — Other Ambulatory Visit: Payer: Self-pay

## 2021-02-25 ENCOUNTER — Ambulatory Visit (HOSPITAL_COMMUNITY)
Admission: RE | Admit: 2021-02-25 | Discharge: 2021-02-25 | Disposition: A | Discharge status: Home / Self Care | Payer: Medicare Other | Source: Ambulatory Visit | Attending: Cardiology | Admitting: Cardiology

## 2021-02-25 DIAGNOSIS — I5022 Chronic systolic (congestive) heart failure: Secondary | ICD-10-CM | POA: Diagnosis not present

## 2021-02-25 LAB — DIGOXIN LEVEL: Digoxin Level: 0.3 ng/mL — ABNORMAL LOW (ref 0.8–2.0)

## 2021-03-07 ENCOUNTER — Encounter (INDEPENDENT_AMBULATORY_CARE_PROVIDER_SITE_OTHER): Payer: Medicare Other | Admitting: Ophthalmology

## 2021-03-07 DIAGNOSIS — E43 Unspecified severe protein-calorie malnutrition: Secondary | ICD-10-CM | POA: Diagnosis not present

## 2021-03-14 DIAGNOSIS — E538 Deficiency of other specified B group vitamins: Secondary | ICD-10-CM | POA: Diagnosis not present

## 2021-03-14 DIAGNOSIS — M810 Age-related osteoporosis without current pathological fracture: Secondary | ICD-10-CM | POA: Diagnosis not present

## 2021-03-14 DIAGNOSIS — I513 Intracardiac thrombosis, not elsewhere classified: Secondary | ICD-10-CM | POA: Diagnosis not present

## 2021-03-14 DIAGNOSIS — I4891 Unspecified atrial fibrillation: Secondary | ICD-10-CM | POA: Diagnosis not present

## 2021-03-14 DIAGNOSIS — I700 Atherosclerosis of aorta: Secondary | ICD-10-CM | POA: Diagnosis not present

## 2021-03-14 DIAGNOSIS — J449 Chronic obstructive pulmonary disease, unspecified: Secondary | ICD-10-CM | POA: Diagnosis not present

## 2021-03-14 DIAGNOSIS — I509 Heart failure, unspecified: Secondary | ICD-10-CM | POA: Diagnosis not present

## 2021-03-14 DIAGNOSIS — Z23 Encounter for immunization: Secondary | ICD-10-CM | POA: Diagnosis not present

## 2021-03-14 DIAGNOSIS — L719 Rosacea, unspecified: Secondary | ICD-10-CM | POA: Diagnosis not present

## 2021-03-14 DIAGNOSIS — I270 Primary pulmonary hypertension: Secondary | ICD-10-CM | POA: Diagnosis not present

## 2021-03-15 DIAGNOSIS — J0100 Acute maxillary sinusitis, unspecified: Secondary | ICD-10-CM | POA: Diagnosis not present

## 2021-03-20 NOTE — Progress Notes (Signed)
Advanced Heart Failure Clinic Note    PCP: Wenda Low, MD PCP-Cardiologist: Jenkins Rouge, MD  HF Cardiologist: Dr. Haroldine Laws  HPI: Laurie James is a 75 y.o. woman with severe COPD, blindness, malnutrition/frailty, HTN, systolic HF due to NICM (diagnosed 7/22) and atrial fibrillation.   Admitted 6/22 with new onset AF. EF normal and RV dilated. Cath with nonobstructive CAD. Sent home with [facility] and rate control w/ plans for outpatient DCCV after 3 weeks of a/c.   Readmitted 7/22 with acute HF -> cardiogenic shock. TEE EF 20% RV moderately down RA massively dilated. Heavy smoke in RA/LA and aortic root. DCCV canceled. Initial Co-ox 38% and required milrinone. Had right sided thoracentesis on 07/13 and subsequent CXRs showed some reaccumulation. Diuresed with IV lasix. Po lasix PRN at discharge.  D/c weight 99 lbs.  Seen in HF Clinic in 10/22 for f/u. Mild progress. Weight up to 104. ReDS 26%  Here for f/u with her daughter. Feeling a lot better. No longer smoking. Able to do ADLs slowly - limited mostly by vision. Still with occasional L ankle swelling. Takes lasix as needed - about 2x/month. No CP. No palpitations. Weight up to 110 pounds,    Echo today 03/21/21: EF 55-60% RV dilated with normal function. Severe TR. Personally reviewed   Cardiac Studies: - Echo (10/06/20): EF 55-60 %   - LHC (10/07/2020) showed mild 40% segmental RCA stenosis as well as 50 to 60% second diagonal branch stenosis  RHC 6/22 RA 3 PA 17/2 (8) PCWP 7 PVR  0.25 WU Fick 2.6/2.0 (LVEF was low)  - TEE (7/22): EF 20% RV moderately down RA massively dilated. Heavy smoke in RA/LA and aortic root.   Past Medical History:  Diagnosis Date   AMD (age related macular degeneration)    COPD (chronic obstructive pulmonary disease) ([place])    Depression    Hiatal hernia    Intermittent low back pain    Memory impairment    Midsternal chest pain    a. 12/2011 Cardiac CTA Ca++ score of 103.3 (80th %), LAD  <50p/m, RCA 50-75.   Osteoporosis    Vitamin B 12 deficiency    Current Outpatient Medications  Medication Sig Dispense Refill   albuterol (VENTOLIN HFA) 108 (90 Base) MCG/ACT inhaler Inhale 2 puffs into the lungs every 4 (four) hours as needed for wheezing.     apixaban (ELIQUIS) 5 MG TABS tablet Take 1 tablet (5 mg total) by mouth 2 (two) times daily. 120 tablet 2   atorvastatin (LIPITOR) 40 MG tablet Take 1 tablet (40 mg total) by mouth daily. 60 tablet 2   cyanocobalamin (,VITAMIN B-12,) 1000 MCG/ML injection Inject 1,000 mcg into the muscle every 30 (thirty) days.  3   dapagliflozin propanediol (FARXIGA) 10 MG TABS tablet Take 1 tablet (10 mg total) by mouth daily. 30 tablet 6   digoxin (LANOXIN) 0.125 MG tablet Take 0.5 tablets (0.0625 mg total) by mouth every other day. 30 tablet 6   furosemide (LASIX) 20 MG tablet Take 1 tablet (20 mg total) by mouth as needed. Leg edema 30 tablet 6   nitroGLYCERIN (NITROSTAT) 0.4 MG SL tablet Place 1 tablet (0.4 mg total) under the tongue every 5 (five) minutes x 3 doses as needed for chest pain. 25 tablet 0   ondansetron (ZOFRAN) 4 MG tablet Take 4 mg by mouth 3 (three) times daily as needed.     pantoprazole (PROTONIX) 40 MG tablet Take 1 tablet (40 mg total) by mouth 2 (two)  times daily. 60 tablet 6   sacubitril-valsartan (ENTRESTO) 24-26 MG Take 1 tablet by mouth 2 (two) times daily. 60 tablet 6   No current facility-administered medications for this encounter.   Allergies  Allergen Reactions   Codeine Nausea And Vomiting   Social History   Socioeconomic History   Marital status: Married    Spouse name: Not on file   Number of children: Not on file   Years of education: Not on file   Highest education level: Not on file  Occupational History   Not on file  Tobacco Use   Smoking status: Every Day    Packs/day: 1.00    Years: 50.00    Pack years: 50.00    Types: Cigarettes   Smokeless tobacco: Never  Substance and Sexual Activity    Alcohol use: No    Alcohol/week: 0.0 standard drinks    Comment: rarely   Drug use: No   Sexual activity: Not on file  Other Topics Concern   Not on file  Social History Narrative   Not on file   Social Determinants of Health   Financial Resource Strain: Not on file  Food Insecurity: Not on file  Transportation Needs: No Transportation Needs   Lack of Transportation (Medical): No   Lack of Transportation (Non-Medical): No  Physical Activity: Not on file  Stress: Not on file  Social Connections: Not on file  Intimate Partner Violence: Not on file   Family History  Problem Relation Age of Onset   Breast cancer Sister    Breast cancer Sister    BP 122/80   Pulse 73   Wt 50 kg (110 lb 3.2 oz)   SpO2 97%   BMI 24.71 kg/m   Wt Readings from Last 3 Encounters:  03/21/21 50 kg (110 lb 3.2 oz)  01/28/21 47.6 kg (105 lb)  12/14/20 45.3 kg (99 lb 13.9 oz)   PHYSICAL EXAM: General:  Sitting in WC. No resp difficulty HEENT: normal + ? Malar rash vs rosacea Neck: supple. no JVD. Carotids 2+ bilat; no bruits. No lymphadenopathy or thryomegaly appreciated. Cor: PMI nondisplaced. Regular rate & rhythm. No rubs, gallops or murmurs. Lungs: clear decreased left base Abdomen: soft, nontender, nondistended. No hepatosplenomegaly. No bruits or masses. Good bowel sounds. Extremities: no cyanosis, clubbing, rash, edema Neuro: alert & orientedx3, cranial nerves grossly intact. moves all 4 extremities w/o difficulty. Affect pleasant  ASSESSMENT & PLAN:  Chronic systolic Heart Failure Nonischemic cardiomyopathy - Echo (10/06/20): EF 55-60 %  - LHC (10/07/2020) showed mild 40% segmental RCA stenosis as well as 50 to 60% second diagonal branch stenosis - TEE on (10/25/20) with considerable "smoke" indicating low output w/ EF 25-30% - Likely atrial fibrillation/tachy-induced CM - Echo today 03/21/21: EF 55-60% RV dilated with normal function. Severe TR. Personally reviewed - EF improved  with maintenance on NSR.  - NYHA III mostly due to COPD and weakness - Volume status stable continue prn lasix - Off spiro with elevated K, but was taking twice a day when K was high. May be able to re-challenge. - Continue Entresto 24-26 mg bid. - Continue lasix 20 mg prn LE edema. - Stop digoxin.  - Continue dapagliflozin 10 mg daily.  - Would hold off on b-blocker with severe COPD  - Labs today.   2. Atrial fibrillation, paroxysmal - Remains in NSR today  - Given h/o tachy-induced CM will have them continue to monitor her HR everyday. If changes abruptly or is irregular ->  contact me - Now off amio - Continue Eliquis.  - Check CBC today   3. COPD - has quit smoking - check PFTs, hall walk and ONOX - followed by Pulmonary  4. CAD - LHC (10/07/2020): mild 40% segmental RCA stenosis as well as 50 to 60% second diagonal branch stenosis - No s/s angina - Of ASA with Eliquis, Continue atorvastatin.     5. Dilated RV with severe TR - Echo today as above.  - PA pressures normal on RHC in 6/22 - Will check PFTs and ONOX - Volume status ok - Can consider TEE and repeat RHC as needed   6.  Right pleural effusion - s/p US-guided thoracentesis on 10/20/20. - CXR on 7/16 shows some reaccumulation. - Dull in right base today. Repeat CXR   Total time spent 35 minutes. Over half that time spent discussing above.    Glori Bickers, MD 03/21/21

## 2021-03-21 ENCOUNTER — Encounter (HOSPITAL_COMMUNITY): Payer: Self-pay | Admitting: Internal Medicine

## 2021-03-21 ENCOUNTER — Ambulatory Visit (HOSPITAL_COMMUNITY)
Admission: RE | Admit: 2021-03-21 | Discharge: 2021-03-21 | Disposition: A | Discharge status: Home / Self Care | Payer: Medicare Other | Source: Ambulatory Visit | Attending: Internal Medicine | Admitting: Internal Medicine

## 2021-03-21 ENCOUNTER — Ambulatory Visit (HOSPITAL_BASED_OUTPATIENT_CLINIC_OR_DEPARTMENT_OTHER)
Admission: RE | Admit: 2021-03-21 | Discharge: 2021-03-21 | Disposition: A | Discharge status: Home / Self Care | Payer: Medicare Other | Source: Ambulatory Visit | Attending: Internal Medicine | Admitting: Internal Medicine

## 2021-03-21 ENCOUNTER — Other Ambulatory Visit: Payer: Self-pay

## 2021-03-21 VITALS — BP 122/80 | HR 73 | Wt 110.2 lb

## 2021-03-21 DIAGNOSIS — Z7901 Long term (current) use of anticoagulants: Secondary | ICD-10-CM | POA: Insufficient documentation

## 2021-03-21 DIAGNOSIS — I5022 Chronic systolic (congestive) heart failure: Secondary | ICD-10-CM | POA: Diagnosis not present

## 2021-03-21 DIAGNOSIS — J449 Chronic obstructive pulmonary disease, unspecified: Secondary | ICD-10-CM

## 2021-03-21 DIAGNOSIS — I4891 Unspecified atrial fibrillation: Secondary | ICD-10-CM | POA: Diagnosis not present

## 2021-03-21 DIAGNOSIS — J90 Pleural effusion, not elsewhere classified: Secondary | ICD-10-CM

## 2021-03-21 DIAGNOSIS — I2510 Atherosclerotic heart disease of native coronary artery without angina pectoris: Secondary | ICD-10-CM | POA: Diagnosis not present

## 2021-03-21 DIAGNOSIS — Z87891 Personal history of nicotine dependence: Secondary | ICD-10-CM | POA: Insufficient documentation

## 2021-03-21 DIAGNOSIS — I110 Hypertensive heart disease with heart failure: Secondary | ICD-10-CM | POA: Diagnosis not present

## 2021-03-21 DIAGNOSIS — Z79899 Other long term (current) drug therapy: Secondary | ICD-10-CM | POA: Insufficient documentation

## 2021-03-21 DIAGNOSIS — J439 Emphysema, unspecified: Secondary | ICD-10-CM | POA: Diagnosis not present

## 2021-03-21 DIAGNOSIS — I272 Pulmonary hypertension, unspecified: Secondary | ICD-10-CM | POA: Insufficient documentation

## 2021-03-21 DIAGNOSIS — I428 Other cardiomyopathies: Secondary | ICD-10-CM | POA: Diagnosis not present

## 2021-03-21 DIAGNOSIS — I480 Paroxysmal atrial fibrillation: Secondary | ICD-10-CM | POA: Diagnosis not present

## 2021-03-21 LAB — CBC
HCT: 34.3 % — ABNORMAL LOW (ref 36.0–46.0)
Hemoglobin: 11.4 g/dL — ABNORMAL LOW (ref 12.0–15.0)
MCH: 33.6 pg (ref 26.0–34.0)
MCHC: 33.2 g/dL (ref 30.0–36.0)
MCV: 101.2 fL — ABNORMAL HIGH (ref 80.0–100.0)
Platelets: 189 10*3/uL (ref 150–400)
RBC: 3.39 MIL/uL — ABNORMAL LOW (ref 3.87–5.11)
RDW: 15.6 % — ABNORMAL HIGH (ref 11.5–15.5)
WBC: 7.1 10*3/uL (ref 4.0–10.5)
nRBC: 0 % (ref 0.0–0.2)

## 2021-03-21 LAB — COMPREHENSIVE METABOLIC PANEL
ALT: 17 U/L (ref 0–44)
AST: 37 U/L (ref 15–41)
Albumin: 3.6 g/dL (ref 3.5–5.0)
Alkaline Phosphatase: 64 U/L (ref 38–126)
Anion gap: 9 (ref 5–15)
BUN: 14 mg/dL (ref 8–23)
CO2: 25 mmol/L (ref 22–32)
Calcium: 9 mg/dL (ref 8.9–10.3)
Chloride: 105 mmol/L (ref 98–111)
Creatinine, Ser: 1.19 mg/dL — ABNORMAL HIGH (ref 0.44–1.00)
GFR, Estimated: 48 mL/min — ABNORMAL LOW (ref >60)
Glucose, Bld: 101 mg/dL — ABNORMAL HIGH (ref 70–99)
Potassium: 3.6 mmol/L (ref 3.5–5.1)
Sodium: 139 mmol/L (ref 135–145)
Total Bilirubin: 0.9 mg/dL (ref 0.3–1.2)
Total Protein: 8 g/dL (ref 6.5–8.1)

## 2021-03-21 LAB — ECHOCARDIOGRAM COMPLETE
Area-P 1/2: 2.56 cm2
P 1/2 time: 419 msec
S' Lateral: 3.1 cm

## 2021-03-21 LAB — BRAIN NATRIURETIC PEPTIDE: B Natriuretic Peptide: 863.2 pg/mL — ABNORMAL HIGH (ref 0.0–100.0)

## 2021-03-21 NOTE — Patient Instructions (Addendum)
Medication Changes:  Stop digoxin  Lab Work:  Labs done today, your results will be available in MyChart, we will contact you for abnormal readings.   Testing/Procedures:  Home overnight oximetry.  [facility] will carry out this test.   Referrals:    Special Instructions // Education:    Follow-Up in: 4 months (April 2023) ** Call in March to make appointment**  At the Advanced [facility] Failure Clinic, you and your health needs are our priority. We have a designated team specialized in the treatment of Heart Failure. This Care Team includes your primary Heart Failure Specialized Cardiologist (physician), Advanced Practice Providers (APPs- Physician Assistants and Nurse Practitioners), and Pharmacist who all work together to provide you with the care you need, when you need it.   You may see any of the following providers on your designated Care Team at your next follow up:  Dr Arvilla Meres Dr Carron Curie, NP Robbie Lis, [place] [facility], [place] Karle Plumber, PharmD   Please be sure to bring in all your medications bottles to every appointment.   Need to Contact us:  If you have any questions or concerns before your next appointment please send [place] a message through [place] or call our office at [phone].    TO LEAVE A MESSAGE FOR THE NURSE SELECT OPTION 2, PLEASE LEAVE A MESSAGE INCLUDING: YOUR NAME DATE OF BIRTH CALL BACK NUMBER REASON FOR CALL**this is important as we prioritize the call backs  YOU WILL RECEIVE A CALL BACK THE SAME DAY AS LONG AS YOU CALL BEFORE 4:00 PM

## 2021-03-22 LAB — RHEUMATOID FACTOR: Rheumatoid fact SerPl-aCnc: 25 IU/mL — ABNORMAL HIGH (ref <14.0)

## 2021-03-22 LAB — ANCA TITERS
Atypical P-ANCA titer: <1:20 {titer}
C-ANCA: <1:20 {titer}
P-ANCA: <1:20 {titer}

## 2021-03-22 LAB — ANTI-SCLERODERMA ANTIBODY: Scleroderma (Scl-70) (ENA) Antibody, IgG: <0.2 AI (ref 0.0–0.9)

## 2021-03-22 LAB — ANA W/REFLEX: Anti Nuclear Antibody (ANA): NEGATIVE

## 2021-04-06 DIAGNOSIS — E43 Unspecified severe protein-calorie malnutrition: Secondary | ICD-10-CM | POA: Diagnosis not present

## 2021-04-17 ENCOUNTER — Other Ambulatory Visit: Payer: Self-pay | Admitting: Cardiology

## 2021-04-18 NOTE — Telephone Encounter (Signed)
Pt last saw Dr Gala Romney 03/21/21, last labs 03/21/21 Creat 1.19, age 76, weight 50kg, based on specified criteria pt is on appropriate dosage of Eliquis 5mg  BID for afib.  Will refill rx.

## 2021-05-01 ENCOUNTER — Other Ambulatory Visit: Payer: Self-pay | Admitting: Cardiology

## 2021-05-04 DIAGNOSIS — I5023 Acute on chronic systolic (congestive) heart failure: Secondary | ICD-10-CM | POA: Diagnosis not present

## 2021-05-04 DIAGNOSIS — R300 Dysuria: Secondary | ICD-10-CM | POA: Diagnosis not present

## 2021-05-04 DIAGNOSIS — R42 Dizziness and giddiness: Secondary | ICD-10-CM | POA: Diagnosis not present

## 2021-05-04 DIAGNOSIS — D649 Anemia, unspecified: Secondary | ICD-10-CM | POA: Diagnosis not present

## 2021-05-07 DIAGNOSIS — E43 Unspecified severe protein-calorie malnutrition: Secondary | ICD-10-CM | POA: Diagnosis not present

## 2021-05-10 NOTE — Progress Notes (Signed)
Patient ID: Laurie James, female   DOB: [birth year], 76 y.o.   MRN: [id number]    76 y.o. with high calcium score and moderate CAD by cardiac CT in 2013. She had normal myovue in 2016 and again in June 2021    Hospitalized June 2022 with chest pain and PAF TTE 10/06/20 EF 55-60% RVE estimated PA 44 severe bi atrial enlargement mild MR Cath  10/07/20 with tightest lesion only 60% D2 and 40% mid RCA TEE 10/25/20 EF down 20-25% ? LAA thrombus severe TR [facility] not done Seen by advanced CHF team Elevated LFTls AST/ALT 682/ 472 PIC line place coox 38% Started on milrinone and NE with gentle diuresis Converted to NSR on amiodarone D/c with Eliquis D/c CHF meds  Seen by DB 03/21/21 stopped smoking Takes lasix as needed for ankle edema Activity more limited by vision   TTE 03/21/21 EF 55-60% RV dilated severe TR   CHF meds no coreg due to severe COPD digoxin d/c aldactone stopped with high K Taking entresto, lasix and dapagliflozin   She is euvolemic with no dyspnea or palpitations No chest pain    ROS: Denies fever, malais, weight loss, blurry vision, decreased visual acuity, cough, sputum, SOB, hemoptysis, pleuritic pain, palpitaitons, heartburn, abdominal pain, melena, lower extremity edema, claudication, or rash.  All other systems reviewed and negative  General:  BP 140/70    Pulse 79    Ht 4\' 8"  (1.422 m)    Wt 109 lb (49.4 kg)    SpO2 98%    BMI 24.44 kg/m   Chronically ill  JVP elevated TR murmur PMI normal  Lungs COPD mild exp wheezing  Trace LE edema Abdomen benign   Medications Current Outpatient Medications  Medication Sig Dispense Refill   albuterol (VENTOLIN HFA) 108 (90 Base) MCG/ACT inhaler Inhale 2 puffs into the lungs every 4 (four) hours as needed for wheezing.     apixaban (ELIQUIS) 5 MG TABS tablet Take 1 tablet by mouth twice daily 180 tablet 1   atorvastatin (LIPITOR) 40 MG tablet Take 1 tablet by mouth once daily 90 tablet 0   cyanocobalamin (,VITAMIN B-12,)  1000 MCG/ML injection Inject 1,000 mcg into the muscle every 30 (thirty) days.  3   dapagliflozin propanediol (FARXIGA) 10 MG TABS tablet Take 1 tablet (10 mg total) by mouth daily. 30 tablet 6   furosemide (LASIX) 20 MG tablet Take 1 tablet (20 mg total) by mouth as needed. Leg edema 30 tablet 6   nitroGLYCERIN (NITROSTAT) 0.4 MG SL tablet Place 1 tablet (0.4 mg total) under the tongue every 5 (five) minutes x 3 doses as needed for chest pain. 25 tablet 0   ondansetron (ZOFRAN) 4 MG tablet Take 4 mg by mouth 3 (three) times daily as needed.     pantoprazole (PROTONIX) 40 MG tablet Take 1 tablet (40 mg total) by mouth 2 (two) times daily. 60 tablet 6   sacubitril-valsartan (ENTRESTO) 24-26 MG Take 1 tablet by mouth 2 (two) times daily. 60 tablet 6   SV IRON 325 MG tablet TAKE 1 TABLET BY MOUTH ONCE DAILY FOR 30 DAYS     No current facility-administered medications for this visit.    Allergies Codeine  Family History: Family History  Problem Relation Age of Onset   Breast cancer Sister    Breast cancer Sister     Social History: Social History   Socioeconomic History   Marital status: Married    Spouse name: Not on file  Number of children: Not on file   Years of education: Not on file   Highest education level: Not on file  Occupational History   Not on file  Tobacco Use   Smoking status: Every Day    Packs/day: 1.00    Years: 50.00    Pack years: 50.00    Types: Cigarettes   Smokeless tobacco: Never  Substance and Sexual Activity   Alcohol use: No    Alcohol/week: 0.0 standard drinks    Comment: rarely   Drug use: No   Sexual activity: Not on file  Other Topics Concern   Not on file  Social History Narrative   Not on file   Social Determinants of Health   Financial Resource Strain: Not on file  Food Insecurity: Not on file  Transportation Needs: No Transportation Needs   Lack of Transportation (Medical): No   Lack of Transportation (Non-Medical): No   Physical Activity: Not on file  Stress: Not on file  Social Connections: Not on file  Intimate Partner Violence: Not on file    Past Surgical History:  Procedure Laterality Date   ABDOMINAL HYSTERECTOMY     IR THORACENTESIS ASP PLEURAL SPACE W/IMG GUIDE  10/20/2020   RIGHT/LEFT HEART CATH AND CORONARY ANGIOGRAPHY N/A 10/07/2020   Procedure: RIGHT/LEFT HEART CATH AND CORONARY ANGIOGRAPHY;  Surgeon: Runell Gess, MD;  Location: [facility] INVASIVE CV LAB;  Service: Cardiovascular;  Laterality: N/A;   TEE WITHOUT CARDIOVERSION N/A 10/25/2020   Procedure: TRANSESOPHAGEAL ECHOCARDIOGRAM (TEE);  Surgeon: Quintella Reichert, MD;  Location: [facility] ENDOSCOPY;  Service: Cardiovascular;  Laterality: N/A;    Past Medical History:  Diagnosis Date   AMD (age related macular degeneration)    COPD (chronic obstructive pulmonary disease) (HCC)    Depression    Hiatal hernia    Intermittent low back pain    Memory impairment    Midsternal chest pain    a. 12/2011 Cardiac CTA Ca++ score of 103.3 (80th %), LAD <50p/m, RCA 50-75.   Osteoporosis    Vitamin B 12 deficiency     Electrocardiogram:  12/23/11  SR rate 94 nonspecific ST changes  06/30/16 SR rate 102 PAC nonspecific ST/T wave changes 07/28/19 SR rate 84 nonspecific lateral T wave changes   Assessment and Plan  1. CAD: moderate D2 non-obstructive elsewhere cath 10/07/20  2. COPD with WHO 2/3 pulmonary HTN pressures ok at cath has stopped smoking  3. Afib:  with shock/bradycardia NSR amiodarone d/c with lung disease continue eliquis  4. DCM:  EF 20-25% needing NE/milrinone in hospital Now normalized Non obstructive CAD cath 10/07/20 and low EDP Continue entresto and lasix   F/U in 6 months   Charlton Haws MD [facility]

## 2021-05-16 ENCOUNTER — Ambulatory Visit: Payer: Medicare Other | Admitting: Cardiovascular Disease

## 2021-05-16 ENCOUNTER — Encounter: Payer: Self-pay | Admitting: Cardiovascular Disease

## 2021-05-16 ENCOUNTER — Other Ambulatory Visit: Payer: Self-pay

## 2021-05-16 VITALS — BP 140/70 | HR 79 | Ht <= 58 in | Wt 109.0 lb

## 2021-05-16 DIAGNOSIS — I5022 Chronic systolic (congestive) heart failure: Secondary | ICD-10-CM

## 2021-05-16 DIAGNOSIS — I4891 Unspecified atrial fibrillation: Secondary | ICD-10-CM

## 2021-05-16 DIAGNOSIS — R300 Dysuria: Secondary | ICD-10-CM | POA: Diagnosis not present

## 2021-05-16 DIAGNOSIS — Z7901 Long term (current) use of anticoagulants: Secondary | ICD-10-CM | POA: Diagnosis not present

## 2021-05-16 DIAGNOSIS — J449 Chronic obstructive pulmonary disease, unspecified: Secondary | ICD-10-CM | POA: Diagnosis not present

## 2021-05-16 NOTE — Patient Instructions (Signed)
Medication Instructions:  °Your physician recommends that you continue on your current medications as directed. Please refer to the Current Medication list given to you today. ° °*If you need a refill on your cardiac medications before your next appointment, please call your pharmacy* ° °Lab Work: °If you have labs (blood work) drawn today and your tests are completely normal, you will receive your results only by: °MyChart Message (if you have MyChart) OR °A paper copy in the mail °If you have any lab test that is abnormal or we need to change your treatment, we will call you to review the results. ° °Testing/Procedures: °None ordered today. ° °Follow-Up: °At [facility], you and your health needs are our priority.  As part of our continuing mission to provide you with exceptional heart care, we have created designated Provider Care Teams.  These Care Teams include your primary Cardiologist (physician) and Advanced Practice Providers (APPs -  Physician Assistants and Nurse Practitioners) who all work together to provide you with the care you need, when you need it. ° °We recommend signing up for the patient portal called "MyChart".  Sign up information is provided on this After Visit Summary.  MyChart is used to connect with patients for Virtual Visits (Telemedicine).  Patients are able to view lab/test results, encounter notes, upcoming appointments, etc.  Non-urgent messages can be sent to your provider as well.   °To learn more about what you can do with MyChart, go to [email or web address].   ° °Your next appointment:   °6 month(s) ° °The format for your next appointment:   °In Person ° °Provider:   °Peter Nishan, MD { ° ° °

## 2021-05-20 ENCOUNTER — Emergency Department (HOSPITAL_COMMUNITY)
Admission: EM | Admit: 2021-05-20 | Discharge: 2021-05-20 | Disposition: A | Discharge status: Home / Self Care | Payer: 59 | Attending: Emergency Medicine | Admitting: Emergency Medicine

## 2021-05-20 ENCOUNTER — Emergency Department (HOSPITAL_COMMUNITY): Payer: 59

## 2021-05-20 DIAGNOSIS — R1030 Lower abdominal pain, unspecified: Secondary | ICD-10-CM | POA: Insufficient documentation

## 2021-05-20 DIAGNOSIS — I4891 Unspecified atrial fibrillation: Secondary | ICD-10-CM | POA: Insufficient documentation

## 2021-05-20 DIAGNOSIS — J449 Chronic obstructive pulmonary disease, unspecified: Secondary | ICD-10-CM | POA: Insufficient documentation

## 2021-05-20 DIAGNOSIS — R300 Dysuria: Secondary | ICD-10-CM | POA: Diagnosis not present

## 2021-05-20 DIAGNOSIS — D649 Anemia, unspecified: Secondary | ICD-10-CM | POA: Diagnosis not present

## 2021-05-20 DIAGNOSIS — J90 Pleural effusion, not elsewhere classified: Secondary | ICD-10-CM | POA: Insufficient documentation

## 2021-05-20 DIAGNOSIS — R310 Gross hematuria: Secondary | ICD-10-CM | POA: Diagnosis not present

## 2021-05-20 DIAGNOSIS — R319 Hematuria, unspecified: Secondary | ICD-10-CM | POA: Diagnosis not present

## 2021-05-20 DIAGNOSIS — R109 Unspecified abdominal pain: Secondary | ICD-10-CM | POA: Diagnosis not present

## 2021-05-20 DIAGNOSIS — Z7901 Long term (current) use of anticoagulants: Secondary | ICD-10-CM | POA: Diagnosis not present

## 2021-05-20 DIAGNOSIS — I700 Atherosclerosis of aorta: Secondary | ICD-10-CM | POA: Diagnosis not present

## 2021-05-20 DIAGNOSIS — R1032 Left lower quadrant pain: Secondary | ICD-10-CM | POA: Diagnosis not present

## 2021-05-20 LAB — URINALYSIS, ROUTINE W REFLEX MICROSCOPIC
Bilirubin Urine: NEGATIVE
Glucose, UA: >=500 mg/dL — AB
Ketones, ur: NEGATIVE mg/dL
Leukocytes,Ua: NEGATIVE
Nitrite: NEGATIVE
Protein, ur: 30 mg/dL — AB
Specific Gravity, Urine: 1.01 (ref 1.005–1.030)
pH: 6 (ref 5.0–8.0)

## 2021-05-20 LAB — BASIC METABOLIC PANEL
Anion gap: 11 (ref 5–15)
BUN: 15 mg/dL (ref 8–23)
CO2: 24 mmol/L (ref 22–32)
Calcium: 8.9 mg/dL (ref 8.9–10.3)
Chloride: 103 mmol/L (ref 98–111)
Creatinine, Ser: 1.16 mg/dL — ABNORMAL HIGH (ref 0.44–1.00)
GFR, Estimated: 49 mL/min — ABNORMAL LOW (ref >60)
Glucose, Bld: 97 mg/dL (ref 70–99)
Potassium: 3.6 mmol/L (ref 3.5–5.1)
Sodium: 138 mmol/L (ref 135–145)

## 2021-05-20 LAB — CBC
HCT: 33.5 % — ABNORMAL LOW (ref 36.0–46.0)
Hemoglobin: 10.9 g/dL — ABNORMAL LOW (ref 12.0–15.0)
MCH: 35.4 pg — ABNORMAL HIGH (ref 26.0–34.0)
MCHC: 32.5 g/dL (ref 30.0–36.0)
MCV: 108.8 fL — ABNORMAL HIGH (ref 80.0–100.0)
Platelets: 332 10*3/uL (ref 150–400)
RBC: 3.08 MIL/uL — ABNORMAL LOW (ref 3.87–5.11)
RDW: 16.2 % — ABNORMAL HIGH (ref 11.5–15.5)
WBC: 7.9 10*3/uL (ref 4.0–10.5)
nRBC: 0 % (ref 0.0–0.2)

## 2021-05-20 MED ORDER — IOHEXOL 300 MG/ML  SOLN
70.0000 mL | Freq: Once | INTRAMUSCULAR | Status: AC | PRN
  Administered 2021-05-20: 70 mL via INTRAVENOUS

## 2021-05-20 NOTE — Discharge Instructions (Signed)
At this time your urine does not appear to obviously have an infection.  Monitor your symptoms.  If your abdominal pain worsens, moves, or if you develop fever, bloody stools, vomiting, or any other new/concerning symptoms then return to the ER for evaluation.  Your CT scan also showed some fluid in your right lung.  This will need to be followed up by your primary care physician

## 2021-05-20 NOTE — ED Triage Notes (Signed)
Pt. Stated, Ive had pain when I pee and then I have blood in my urine. Ive had medicine for the problem and Im finished with it. Im still having symptoms.

## 2021-05-20 NOTE — ED Provider Notes (Signed)
[facility] EMERGENCY DEPARTMENT Provider Note   CSN: [id number] Arrival date & time: 05/20/21  0800     History  Chief Complaint  Patient presents with   Dysuria   Hematuria    Beckley Przybylski Jarrell Manzanares is a 76 y.o. female.  HPI 76 year old female with a history of COPD, A-fib on Eliquis, and memory impairment presents with urinary discomfort and lower abdominal pain.  Sounds like this has been ongoing for about 2 weeks.  The daughter helps to fill in because the patient is not able to provide all the history.  Patient went to the doctor at the end of January and was prescribed Cipro per outside record notes.  Went back to the doctor 4 days ago and did not have a UTI at that time but did have blood in her urine so she was referred to urology.  She reports her lower abdomen is still hurting and her pelvis is burning and so she presented to the ER today.  She called her daughter to ask her to bring her back.  Is unclear if she is having gross hematuria because the patient is blind.  Home Medications Prior to Admission medications   Medication Sig Start Date End Date Taking? Authorizing Provider  albuterol (VENTOLIN HFA) 108 (90 Base) MCG/ACT inhaler Inhale 2 puffs into the lungs every 4 (four) hours as needed for wheezing. 06/08/20   [provider]  apixaban (ELIQUIS) 5 MG TABS tablet Take 1 tablet by mouth twice daily 04/18/21   Bensimhon, Shaune Pascal, MD  atorvastatin (LIPITOR) 40 MG tablet Take 1 tablet by mouth once daily 05/02/21   Josue Hector, MD  cyanocobalamin (,VITAMIN B-12,) 1000 MCG/ML injection Inject 1,000 mcg into the muscle every 30 (thirty) days. 06/11/16   [provider]  dapagliflozin propanediol (FARXIGA) 10 MG TABS tablet Take 1 tablet (10 mg total) by mouth daily. 11/25/20   Milford, Maricela Bo, FNP  furosemide (LASIX) 20 MG tablet Take 1 tablet (20 mg total) by mouth as needed. Leg edema 11/04/20   Clegg, Amy D, NP  nitroGLYCERIN  (NITROSTAT) 0.4 MG SL tablet Place 1 tablet (0.4 mg total) under the tongue every 5 (five) minutes x 3 doses as needed for chest pain. 10/11/20   Tommie Raymond, NP  ondansetron (ZOFRAN) 4 MG tablet Take 4 mg by mouth 3 (three) times daily as needed. 11/22/20   [provider]  pantoprazole (PROTONIX) 40 MG tablet Take 1 tablet (40 mg total) by mouth 2 (two) times daily. 11/04/20   Clegg, Amy D, NP  sacubitril-valsartan (ENTRESTO) 24-26 MG Take 1 tablet by mouth 2 (two) times daily. 11/04/20   Clegg, Amy D, NP  SV IRON 325 MG tablet TAKE 1 TABLET BY MOUTH ONCE DAILY FOR 30 DAYS 05/04/21   [provider]      Allergies    Codeine    Review of Systems   Review of Systems  Gastrointestinal:  Positive for abdominal pain and constipation.  Genitourinary:  Positive for dysuria.   Physical Exam Updated Vital Signs BP 136/87    Pulse 87    Temp 97.7 F (36.5 C) (Oral)    Resp 18    SpO2 100%  Physical Exam Vitals and nursing note reviewed.  Constitutional:      General: She is not in acute distress.    Appearance: She is well-developed. She is not ill-appearing or diaphoretic.  HENT:     Head: Normocephalic and atraumatic.  Cardiovascular:     Rate and Rhythm: Normal rate and regular rhythm.     Heart sounds: Normal heart sounds.  Pulmonary:     Effort: Pulmonary effort is normal.     Breath sounds: Normal breath sounds.  Abdominal:     Palpations: Abdomen is soft.     Tenderness: There is abdominal tenderness in the suprapubic area.  Skin:    General: Skin is warm and dry.  Neurological:     Mental Status: She is alert.    ED Results / Procedures / Treatments   Labs (all labs ordered are listed, but only abnormal results are displayed) Labs Reviewed  URINALYSIS, ROUTINE W REFLEX MICROSCOPIC - Abnormal; Notable for the following components:      Result Value   Glucose, UA >=500 (*)    Hgb urine dipstick MODERATE (*)    Protein, ur 30 (*)    Bacteria, UA RARE  (*)    All other components within normal limits  CBC - Abnormal; Notable for the following components:   RBC 3.08 (*)    Hemoglobin 10.9 (*)    HCT 33.5 (*)    MCV 108.8 (*)    MCH 35.4 (*)    RDW 16.2 (*)    All other components within normal limits  BASIC METABOLIC PANEL - Abnormal; Notable for the following components:   Creatinine, Ser 1.16 (*)    GFR, Estimated 49 (*)    All other components within normal limits  URINE CULTURE    EKG None  Radiology CT ABDOMEN PELVIS W CONTRAST  Result Date: 05/20/2021 CLINICAL DATA:  LEFT lower quadrant abdominal pain, recurrent complicated UTI in a female EXAM: CT ABDOMEN AND PELVIS WITH CONTRAST TECHNIQUE: Multidetector CT imaging of the abdomen and pelvis was performed using the standard protocol following bolus administration of intravenous contrast. RADIATION DOSE REDUCTION: This exam was performed according to the departmental dose-optimization program which includes automated exposure control, adjustment of the mA and/or kV according to patient size and/or use of iterative reconstruction technique. CONTRAST:  17mL OMNIPAQUE IOHEXOL 300 MG/ML SOLN IV. No oral contrast. COMPARISON:  None FINDINGS: Lower chest: Moderate RIGHT pleural effusion with minimal basilar atelectasis. Emphysematous changes. Subpleural interstitial thickening at lung bases. Hepatobiliary: Gallbladder and liver normal appearance Pancreas: Single small calcification at pancreatic head. Pancreas otherwise normal appearance without mass or ductal dilatation. Spleen: Normal appearance Adrenals/Urinary Tract: Adrenal glands normal appearance. Cortical thinning of both kidneys consistent with age. No renal mass or hydronephrosis. No ureteral calcification or dilatation. Bladder unremarkable. Stomach/Bowel: Normal appendix. Stool throughout colon. Bowel loops otherwise normal appearance. Stomach decompressed. Vascular/Lymphatic: Atherosclerotic calcifications aorta and iliac  arteries. Aorta normal caliber. Vascular structures patent. No adenopathy. Reproductive: Uterus surgically absent with nonvisualization of ovaries. Other: No free air or free fluid. No hernia or inflammatory process. Musculoskeletal: Osseous demineralization with degenerative changes and scoliosis lumbar spine. IMPRESSION: Moderate RIGHT pleural effusion with minimal basilar atelectasis. No acute intra-abdominal or intrapelvic abnormalities. Aortic Atherosclerosis (ICD10-I70.0) and Emphysema (ICD10-J43.9). Electronically Signed   By: Lavonia Dana M.D.   On: 05/20/2021 11:49    Procedures Procedures    Medications Ordered in ED Medications  iohexol (OMNIPAQUE) 300 MG/ML solution 70 mL (70 mLs Intravenous Contrast Given 05/20/21 1134)    ED Course/ Medical Decision Making/ A&P                            Unfortunately patient is not the best  historian.  However it seems like she might not actually be having some dysuria right now but more just lower abdominal discomfort.  She is well-appearing with stable vital signs.  She has chronic edema but no significant worsening.  CT was obtained given the continued symptoms and benign previous work-up.  Labs show a mild anemia compared to baseline but only a point off.  Renal function is okay compared to baseline.  Her urinalysis shows some blood but she also had to have an in and out cath and there is no evidence of UTI.  Will send for culture given the history.  The CT abdomen and pelvis was reviewed by myself and shows a right pleural effusion but no obvious intra-abdominal emergency.  On further chart review, she has had issues with a right-sided pleural effusion, even requiring thoracentesis in the past.  However she is not hypoxic, short of breath, and thus I do not think this needs emergent work-up/treatment right now.  I think she is stable for discharge home.  Unclear cause of her symptoms but she has declined pain medicine and patient and daughter feel  comfortable going home.  Will discharge home with return precautions.        Final Clinical Impression(s) / ED Diagnoses Final diagnoses:  Lower abdominal pain  Hematuria, unspecified type  Pleural effusion on right    Rx / DC Orders ED Discharge Orders     None         Sherwood Gambler, MD 05/20/21 1254

## 2021-05-21 LAB — URINE CULTURE: Culture: NO GROWTH

## 2021-05-23 DIAGNOSIS — R3129 Other microscopic hematuria: Secondary | ICD-10-CM | POA: Diagnosis not present

## 2021-05-23 DIAGNOSIS — E538 Deficiency of other specified B group vitamins: Secondary | ICD-10-CM | POA: Diagnosis not present

## 2021-05-23 DIAGNOSIS — J90 Pleural effusion, not elsewhere classified: Secondary | ICD-10-CM | POA: Diagnosis not present

## 2021-05-23 DIAGNOSIS — D649 Anemia, unspecified: Secondary | ICD-10-CM | POA: Diagnosis not present

## 2021-05-23 DIAGNOSIS — K5900 Constipation, unspecified: Secondary | ICD-10-CM | POA: Diagnosis not present

## 2021-05-29 ENCOUNTER — Other Ambulatory Visit (HOSPITAL_COMMUNITY): Payer: Self-pay | Admitting: Adult Health

## 2021-05-31 ENCOUNTER — Other Ambulatory Visit (HOSPITAL_COMMUNITY): Payer: Self-pay | Admitting: Adult Health

## 2021-06-01 ENCOUNTER — Telehealth (HOSPITAL_COMMUNITY): Payer: Self-pay | Admitting: Student-PharmD

## 2021-06-01 NOTE — Telephone Encounter (Signed)
Patient left a message saying she had a question about her Entresto. Returned call - she was requesting a refill on Entresto which was sent in yesterday and she was able to pick up. She has no further questions at this time.

## 2021-06-07 DIAGNOSIS — E43 Unspecified severe protein-calorie malnutrition: Secondary | ICD-10-CM | POA: Diagnosis not present

## 2021-06-09 DIAGNOSIS — D649 Anemia, unspecified: Secondary | ICD-10-CM | POA: Diagnosis not present

## 2021-06-09 DIAGNOSIS — R3129 Other microscopic hematuria: Secondary | ICD-10-CM | POA: Diagnosis not present

## 2021-06-19 ENCOUNTER — Other Ambulatory Visit (HOSPITAL_COMMUNITY): Payer: Self-pay | Admitting: Family Medicine

## 2021-06-25 ENCOUNTER — Other Ambulatory Visit (HOSPITAL_COMMUNITY): Payer: Self-pay | Admitting: Internal Medicine

## 2021-07-05 DIAGNOSIS — E43 Unspecified severe protein-calorie malnutrition: Secondary | ICD-10-CM | POA: Diagnosis not present

## 2021-07-06 DIAGNOSIS — I270 Primary pulmonary hypertension: Secondary | ICD-10-CM | POA: Diagnosis not present

## 2021-07-06 DIAGNOSIS — I700 Atherosclerosis of aorta: Secondary | ICD-10-CM | POA: Diagnosis not present

## 2021-07-06 DIAGNOSIS — I5022 Chronic systolic (congestive) heart failure: Secondary | ICD-10-CM | POA: Diagnosis not present

## 2021-07-06 DIAGNOSIS — Z0000 Encounter for general adult medical examination without abnormal findings: Secondary | ICD-10-CM | POA: Diagnosis not present

## 2021-07-06 DIAGNOSIS — E538 Deficiency of other specified B group vitamins: Secondary | ICD-10-CM | POA: Diagnosis not present

## 2021-07-06 DIAGNOSIS — M810 Age-related osteoporosis without current pathological fracture: Secondary | ICD-10-CM | POA: Diagnosis not present

## 2021-07-06 DIAGNOSIS — H548 Legal blindness, as defined in USA: Secondary | ICD-10-CM | POA: Diagnosis not present

## 2021-07-06 DIAGNOSIS — D6869 Other thrombophilia: Secondary | ICD-10-CM | POA: Diagnosis not present

## 2021-07-06 DIAGNOSIS — J449 Chronic obstructive pulmonary disease, unspecified: Secondary | ICD-10-CM | POA: Diagnosis not present

## 2021-07-06 DIAGNOSIS — E46 Unspecified protein-calorie malnutrition: Secondary | ICD-10-CM | POA: Diagnosis not present

## 2021-07-06 DIAGNOSIS — E785 Hyperlipidemia, unspecified: Secondary | ICD-10-CM | POA: Diagnosis not present

## 2021-07-06 DIAGNOSIS — I4891 Unspecified atrial fibrillation: Secondary | ICD-10-CM | POA: Diagnosis not present

## 2021-07-07 ENCOUNTER — Other Ambulatory Visit: Payer: Self-pay | Admitting: Internal Medicine

## 2021-07-07 DIAGNOSIS — Z1231 Encounter for screening mammogram for malignant neoplasm of breast: Secondary | ICD-10-CM

## 2021-07-11 DIAGNOSIS — R002 Palpitations: Secondary | ICD-10-CM | POA: Diagnosis not present

## 2021-07-11 DIAGNOSIS — R519 Headache, unspecified: Secondary | ICD-10-CM | POA: Diagnosis not present

## 2021-07-11 DIAGNOSIS — R051 Acute cough: Secondary | ICD-10-CM | POA: Diagnosis not present

## 2021-07-11 DIAGNOSIS — J349 Unspecified disorder of nose and nasal sinuses: Secondary | ICD-10-CM | POA: Diagnosis not present

## 2021-07-14 DIAGNOSIS — N3090 Cystitis, unspecified without hematuria: Secondary | ICD-10-CM | POA: Diagnosis not present

## 2021-07-14 DIAGNOSIS — N3001 Acute cystitis with hematuria: Secondary | ICD-10-CM | POA: Diagnosis not present

## 2021-07-14 DIAGNOSIS — R301 Vesical tenesmus: Secondary | ICD-10-CM | POA: Diagnosis not present

## 2021-07-14 DIAGNOSIS — R81 Glycosuria: Secondary | ICD-10-CM | POA: Diagnosis not present

## 2021-07-20 DIAGNOSIS — R3121 Asymptomatic microscopic hematuria: Secondary | ICD-10-CM | POA: Diagnosis not present

## 2021-07-24 ENCOUNTER — Other Ambulatory Visit (HOSPITAL_COMMUNITY): Payer: Self-pay | Admitting: Internal Medicine

## 2021-07-24 ENCOUNTER — Other Ambulatory Visit: Payer: Self-pay | Admitting: Cardiovascular Disease

## 2021-07-25 ENCOUNTER — Telehealth (HOSPITAL_COMMUNITY): Payer: Self-pay | Admitting: Pharmacy Technician

## 2021-07-25 NOTE — Telephone Encounter (Signed)
Patient Advocate Encounter ?  ?Received notification from [employer] that prior authorization for Sherryll Burger is required. ?  ?PA submitted on CoverMyMeds ?Key [id number] ?Status is pending ?  ?Will continue to follow. ? ?

## 2021-07-26 ENCOUNTER — Other Ambulatory Visit (HOSPITAL_COMMUNITY): Payer: Self-pay

## 2021-07-28 NOTE — Telephone Encounter (Signed)
Advanced Heart Failure Patient Advocate Encounter ? ?Patient's insurance canceled the PA for Entresto 24-26mg . Reasoning, the medication is on the list of covered drugs. No PA is needed at this time. ? ?Archer Asa, CPhT ? ?

## 2021-08-01 ENCOUNTER — Other Ambulatory Visit (HOSPITAL_COMMUNITY): Payer: Self-pay

## 2021-08-01 ENCOUNTER — Telehealth (HOSPITAL_COMMUNITY): Payer: Self-pay | Admitting: Pharmacy Technician

## 2021-08-01 ENCOUNTER — Other Ambulatory Visit (HOSPITAL_COMMUNITY): Payer: Self-pay | Admitting: *Deleted

## 2021-08-01 NOTE — Telephone Encounter (Signed)
Advanced Heart Failure Patient Advocate Encounter ? ?Prior Authorization for Sherryll Burger has been submitted over the phone and approved.   ? ?PA# [id number] ?Effective dates: 08/01/21 through 08/02/22 ? ?Called and spoke with the patient's wife. ? ?Archer Asa, CPhT ? ? ?

## 2021-08-05 DIAGNOSIS — E43 Unspecified severe protein-calorie malnutrition: Secondary | ICD-10-CM | POA: Diagnosis not present

## 2021-08-09 ENCOUNTER — Ambulatory Visit (HOSPITAL_COMMUNITY)
Admission: RE | Admit: 2021-08-09 | Discharge: 2021-08-09 | Disposition: A | Discharge status: Home / Self Care | Payer: Medicare Other | Source: Ambulatory Visit | Attending: Internal Medicine | Admitting: Internal Medicine

## 2021-08-09 ENCOUNTER — Encounter (HOSPITAL_COMMUNITY): Payer: Self-pay | Admitting: Internal Medicine

## 2021-08-09 VITALS — BP 110/68 | HR 87 | Wt 109.0 lb

## 2021-08-09 DIAGNOSIS — J90 Pleural effusion, not elsewhere classified: Secondary | ICD-10-CM | POA: Diagnosis not present

## 2021-08-09 DIAGNOSIS — I5022 Chronic systolic (congestive) heart failure: Secondary | ICD-10-CM

## 2021-08-09 DIAGNOSIS — I480 Paroxysmal atrial fibrillation: Secondary | ICD-10-CM | POA: Insufficient documentation

## 2021-08-09 DIAGNOSIS — I071 Rheumatic tricuspid insufficiency: Secondary | ICD-10-CM

## 2021-08-09 DIAGNOSIS — I2510 Atherosclerotic heart disease of native coronary artery without angina pectoris: Secondary | ICD-10-CM | POA: Insufficient documentation

## 2021-08-09 DIAGNOSIS — I428 Other cardiomyopathies: Secondary | ICD-10-CM | POA: Diagnosis not present

## 2021-08-09 DIAGNOSIS — J449 Chronic obstructive pulmonary disease, unspecified: Secondary | ICD-10-CM | POA: Diagnosis not present

## 2021-08-09 DIAGNOSIS — E46 Unspecified protein-calorie malnutrition: Secondary | ICD-10-CM | POA: Insufficient documentation

## 2021-08-09 DIAGNOSIS — R54 Age-related physical debility: Secondary | ICD-10-CM | POA: Diagnosis not present

## 2021-08-09 DIAGNOSIS — I4891 Unspecified atrial fibrillation: Secondary | ICD-10-CM | POA: Diagnosis not present

## 2021-08-09 DIAGNOSIS — I110 Hypertensive heart disease with heart failure: Secondary | ICD-10-CM | POA: Insufficient documentation

## 2021-08-09 DIAGNOSIS — Z87891 Personal history of nicotine dependence: Secondary | ICD-10-CM | POA: Diagnosis not present

## 2021-08-09 DIAGNOSIS — Z79899 Other long term (current) drug therapy: Secondary | ICD-10-CM | POA: Insufficient documentation

## 2021-08-09 DIAGNOSIS — I5032 Chronic diastolic (congestive) heart failure: Secondary | ICD-10-CM | POA: Diagnosis not present

## 2021-08-09 DIAGNOSIS — Z7901 Long term (current) use of anticoagulants: Secondary | ICD-10-CM | POA: Insufficient documentation

## 2021-08-09 NOTE — Progress Notes (Signed)
?Advanced Heart Failure Clinic Note  ? ? ?PCP: Georgann Housekeeper, MD ?PCP-Cardiologist: Charlton Haws, MD  ?HF Cardiologist: Dr. Gala Romney ? ?HPI: ?Laurie James is a 76 y.o. woman with severe COPD, blindness, malnutrition/frailty, HTN, systolic HF due to NICM (diagnosed 7/22) and atrial fibrillation.  ? ?Admitted 6/22 with new onset AF. EF normal and RV dilated. Cath with nonobstructive CAD. Sent home with [facility] and rate control w/ plans for outpatient DCCV after 3 weeks of a/c. ?  ?Readmitted 7/22 with acute HF -> cardiogenic shock. TEE EF 20% RV moderately down RA massively dilated. Heavy smoke in RA/LA and aortic root. DCCV canceled. Initial Co-ox 38% and required milrinone. Had right sided thoracentesis on 07/13 and subsequent CXRs showed some reaccumulation. Diuresed with IV lasix. Po lasix PRN at discharge.  D/c weight 99 lbs. ? ?Here for f/u with her daughter. Feels pretty good. Out walking and going up steps with out problem. Occasional LE edema but resolves quickly. No syncope or presyncope. No recurrent AF ? ?Echo 03/21/21: EF 55-60% RV dilated with normal function. Severe TR. Personally reviewed ? ? ?Cardiac Studies: ?- Echo (10/06/20): EF 55-60 %  ? ?- LHC (10/07/2020) showed mild 40% segmental RCA stenosis as well as 50 to 60% second diagonal branch stenosis ? ?RHC 6/22 ?RA 3 ?PA 17/2 (8) ?PCWP 7 ?PVR  0.25 WU ?Fick 2.6/2.0 (LVEF was low) ? ?- TEE (7/22): EF 20% RV moderately down RA massively dilated. Heavy smoke in RA/LA and aortic root.  ? ?Past Medical History:  ?Diagnosis Date  ? AMD (age related macular degeneration)   ? COPD (chronic obstructive pulmonary disease) (HCC)   ? Depression   ? Hiatal hernia   ? Intermittent low back pain   ? Memory impairment   ? Midsternal chest pain   ? a. 12/2011 Cardiac CTA Ca++ score of 103.3 (80th %), LAD <50p/m, RCA 50-75.  ? Osteoporosis   ? Vitamin B 12 deficiency   ? ?Current Outpatient Medications  ?Medication Sig Dispense Refill  ? albuterol (VENTOLIN HFA) 108  (90 Base) MCG/ACT inhaler Inhale 2 puffs into the lungs every 4 (four) hours as needed for wheezing.    ? apixaban (ELIQUIS) 5 MG TABS tablet Take 1 tablet by mouth twice daily 180 tablet 1  ? atorvastatin (LIPITOR) 40 MG tablet Take 1 tablet by mouth once daily 90 tablet 3  ? cyanocobalamin (,VITAMIN B-12,) 1000 MCG/ML injection Inject 1,000 mcg into the muscle every 30 (thirty) days.  3  ? ENTRESTO 24-26 MG Take 1 tablet by mouth twice daily 60 tablet 11  ? FARXIGA 10 MG TABS tablet Take 1 tablet by mouth once daily 90 tablet 3  ? furosemide (LASIX) 20 MG tablet Take 1 tablet (20 mg total) by mouth as needed. Leg edema 30 tablet 6  ? nitroGLYCERIN (NITROSTAT) 0.4 MG SL tablet Place 1 tablet (0.4 mg total) under the tongue every 5 (five) minutes x 3 doses as needed for chest pain. 25 tablet 0  ? ondansetron (ZOFRAN) 4 MG tablet Take 4 mg by mouth 3 (three) times daily as needed.    ? pantoprazole (PROTONIX) 40 MG tablet Take 1 tablet by mouth twice daily 180 tablet 0  ? ?No current facility-administered medications for this encounter.  ? ?Allergies  ?Allergen Reactions  ? Codeine Nausea And Vomiting  ? ?Social History  ? ?Socioeconomic History  ? Marital status: Married  ?  Spouse name: Not on file  ? Number of children: Not on file  ?  Years of education: Not on file  ? Highest education level: Not on file  ?Occupational History  ? Not on file  ?Tobacco Use  ? Smoking status: Every Day  ?  Packs/day: 1.00  ?  Years: 50.00  ?  Pack years: 50.00  ?  Types: Cigarettes  ? Smokeless tobacco: Never  ?Substance and Sexual Activity  ? Alcohol use: No  ?  Alcohol/week: 0.0 standard drinks  ?  Comment: rarely  ? Drug use: No  ? Sexual activity: Not on file  ?Other Topics Concern  ? Not on file  ?Social History Narrative  ? Not on file  ? ?Social Determinants of Health  ? ?Financial Resource Strain: Not on file  ?Food Insecurity: Not on file  ?Transportation Needs: No Transportation Needs  ? Lack of Transportation (Medical):  No  ? Lack of Transportation (Non-Medical): No  ?Physical Activity: Not on file  ?Stress: Not on file  ?Social Connections: Not on file  ?Intimate Partner Violence: Not on file  ? ?Family History  ?Problem Relation Age of Onset  ? Breast cancer Sister   ? Breast cancer Sister   ? ?BP 110/68   Pulse 87   Wt 49.4 kg (109 lb)   SpO2 93%   BMI 24.44 kg/m?  ? ?Wt Readings from Last 3 Encounters:  ?08/09/21 49.4 kg (109 lb)  ?05/16/21 49.4 kg (109 lb)  ?03/21/21 50 kg (110 lb 3.2 oz)  ? ?PHYSICAL EXAM: ?General: Elderly  No resp difficulty ?HEENT: normal + rosacea ?Neck: supple. no JVD. Carotids 2+ bilat; no bruits. No lymphadenopathy or thryomegaly appreciated. ?Cor: PMI nondisplaced. Irregular No rubs, gallops or murmurs. ?Lungs: decreased throughout Dull in right base ?Abdomen: soft, nontender, nondistended. No hepatosplenomegaly. No bruits or masses. Good bowel sounds. ?Extremities: no cyanosis, clubbing, rash, edema ?Neuro: alert & orientedx3, cranial nerves grossly intact. moves all 4 extremities w/o difficulty. Affect pleasant ? ? ?ASSESSMENT & PLAN: ? ?Chronic systolic Heart Failure Nonischemic cardiomyopathy ?- Echo (10/06/20): EF 55-60 %  ?- LHC (10/07/2020) showed mild 40% segmental RCA stenosis as well as 50 to 60% second diagonal branch stenosis ?- TEE on (10/25/20) with considerable "smoke" indicating low output w/ EF 25-30% ?- Likely atrial fibrillation/tachy-induced CM ?- Echo today 03/21/21: EF 55-60% RV dilated with normal function. Severe TR. Personally reviewed ?- EF improved with maintenance on NSR.  ?- Improved NYHA II-III mostly due to COPD ?- Volume status stable continue prn lasix ?- Off spiro with elevated K ?- Continue Entresto 24-26 mg bid. ?- Continue lasix 20 mg prn LE edema. ?- Continue dapagliflozin 10 mg daily.  ?- Would hold off on b-blocker with severe COPD  ? ?  ?2. Atrial fibrillation, paroxysmal ?- Now off amio. Back in AF. Rate controlled. Asymptomatic ?- Given h/o tachy-induced CM  will check echo in 1 month. If EF stable continue rate control strategy ?- If EF falling will need to restart amio and repeat DC-CV ?- Continue Eliquis.  ?  ?3. COPD ?- has quit smoking ? ?4. CAD ?- LHC (10/07/2020): mild 40% segmental RCA stenosis as well as 50 to 60% second diagonal branch stenosis ?- No s/s angina ?- Of ASA with Eliquis, Continue atorvastatin.   ?  ?5. Dilated RV with severe TR ?- Echo 03/21/21: EF 55-60% RV dilated with normal function. Severe TR. Personally reviewed ?- PA pressures normal on RHC in 6/22 ?- No significant RV failure. Volume status looks good ?- Repeating echo  ?  ?6.  Right  pleural effusion ?- s/p US-guided thoracentesis on 10/20/20. ?- CXR on 7/16 shows some reaccumulation. ?- CXR 12/22 small effusion  ?  ?Arvilla Meres, MD ?08/09/21  ?

## 2021-08-09 NOTE — Patient Instructions (Addendum)
Medication Changes: ? ?None. Continue current regimen. ? ?Lab Work: ? ?None. ? ?Testing/Procedures: ? ?Your physician has requested that you have an echocardiogram. Echocardiography is a painless test that uses sound waves to create images of your heart. It provides your doctor with information about the size and shape of your heart and how well your heart?s chambers and valves are working. This procedure takes approximately one hour. There are no restrictions for this procedure. In 1 month.  ? ?Referrals: ? ?None. ? ?Special Instructions // Education: ? ?Do the following things EVERYDAY: ?Weigh yourself in the morning before breakfast. Write it down and keep it in a log. ?Take your medicines as prescribed ?Eat low salt foods--Limit salt (sodium) to 2000 mg per day.  ?Stay as active as you can everyday ?Limit all fluids for the day to less than 2 liters ? ?Follow-Up in: in 6 months. **Please call the office in September to schedule your follow up for November** ? ?At the [facility], you and your health needs are our priority. We have a designated team specialized in the treatment of Heart Failure. This Care Team includes your primary Heart Failure Specialized Cardiologist (physician), Advanced Practice Providers (APPs- Physician Assistants and Nurse Practitioners), and Pharmacist who all work together to provide you with the care you need, when you need it.  ? ?You may see any of the following providers on your designated Care Team at your next follow up: ? ?Dr Arvilla Meres ?Dr Marca Ancona ?Tonye Becket, NP ?Robbie Lis, PA ?Jessica Milford,NP ?Anna Genre, PA ?Karle Plumber, PharmD ? ? ?Please be sure to bring in all your medications bottles to every appointment.  ? ?Need to Contact us: ? ?If you have any questions or concerns before your next appointment please send [place] a message through [place] or call our office at [phone].   ? ?TO LEAVE A MESSAGE FOR THE NURSE SELECT OPTION 2,  PLEASE LEAVE A MESSAGE INCLUDING: ?YOUR NAME ?DATE OF BIRTH ?CALL BACK NUMBER ?REASON FOR CALL**this is important as we prioritize the call backs ? ?YOU WILL RECEIVE A CALL BACK THE SAME DAY AS LONG AS YOU CALL BEFORE 4:00 PM ? ? ?

## 2021-08-19 DIAGNOSIS — R3121 Asymptomatic microscopic hematuria: Secondary | ICD-10-CM | POA: Diagnosis not present

## 2021-08-21 ENCOUNTER — Other Ambulatory Visit (HOSPITAL_COMMUNITY): Payer: Self-pay | Admitting: Internal Medicine

## 2021-08-23 ENCOUNTER — Other Ambulatory Visit (HOSPITAL_COMMUNITY): Payer: Self-pay | Admitting: *Deleted

## 2021-08-23 MED ORDER — PANTOPRAZOLE SODIUM 40 MG PO TBEC: 40.0000 mg | DELAYED_RELEASE_TABLET | Freq: Two times a day (BID) | ORAL | 3 refills | Status: DC

## 2021-09-09 ENCOUNTER — Ambulatory Visit (HOSPITAL_COMMUNITY)
Admission: RE | Admit: 2021-09-09 | Discharge: 2021-09-09 | Disposition: A | Discharge status: Home / Self Care | Payer: 59 | Source: Ambulatory Visit | Attending: Internal Medicine | Admitting: Internal Medicine

## 2021-09-09 DIAGNOSIS — J449 Chronic obstructive pulmonary disease, unspecified: Secondary | ICD-10-CM | POA: Diagnosis not present

## 2021-09-09 DIAGNOSIS — G4730 Sleep apnea, unspecified: Secondary | ICD-10-CM | POA: Diagnosis not present

## 2021-09-09 DIAGNOSIS — I4891 Unspecified atrial fibrillation: Secondary | ICD-10-CM | POA: Diagnosis not present

## 2021-09-09 DIAGNOSIS — I272 Pulmonary hypertension, unspecified: Secondary | ICD-10-CM | POA: Diagnosis not present

## 2021-09-09 DIAGNOSIS — I5032 Chronic diastolic (congestive) heart failure: Secondary | ICD-10-CM | POA: Insufficient documentation

## 2021-09-09 DIAGNOSIS — F17200 Nicotine dependence, unspecified, uncomplicated: Secondary | ICD-10-CM | POA: Insufficient documentation

## 2021-09-09 LAB — ECHOCARDIOGRAM COMPLETE
Area-P 1/2: 3.65 cm2
S' Lateral: 3.5 cm
Single Plane A2C EF: 42.1 %

## 2021-09-09 NOTE — Progress Notes (Signed)
  Echocardiogram 2D Echocardiogram has been performed.  Laurie James 09/09/2021, 2:02 PM

## 2021-09-14 ENCOUNTER — Telehealth (HOSPITAL_COMMUNITY): Payer: Self-pay

## 2021-09-14 NOTE — Telephone Encounter (Signed)
Can you document on the patients most recent echo? She called wanting the results.

## 2021-09-15 NOTE — Telephone Encounter (Signed)
Pt aware of results, advised her that after Dr Haroldine Laws reviews we will call her back about ?restarting amio and sch dccv. She reports occasionally feeling her heart racing but overall is feeling well.

## 2021-09-20 ENCOUNTER — Telehealth (HOSPITAL_COMMUNITY): Payer: Self-pay | Admitting: Pharmacy Technician

## 2021-09-20 ENCOUNTER — Other Ambulatory Visit (HOSPITAL_COMMUNITY): Payer: Self-pay

## 2021-09-20 ENCOUNTER — Other Ambulatory Visit (HOSPITAL_COMMUNITY): Payer: Self-pay | Admitting: *Deleted

## 2021-09-20 MED ORDER — EMPAGLIFLOZIN 10 MG PO TABS: 10.0000 mg | ORAL_TABLET | Freq: Every day | ORAL | 3 refills | Status: DC

## 2021-09-20 NOTE — Telephone Encounter (Signed)
Advanced [facility] Patient Advocate Encounter  Received PA for [place]. Upon further review, the patient's insurance prefers [place] with no PA needed. Provider okayed the switch. 30 day co-pay $35, 90 day co-pay, $105. Called and spoke with patient who has agreed to switching to [place] 10mg . Sent 90 day RX request to [place] ([facility]) to send to [place].   [place], CPhT

## 2021-10-16 ENCOUNTER — Other Ambulatory Visit: Payer: Self-pay | Admitting: Internal Medicine

## 2021-10-17 ENCOUNTER — Other Ambulatory Visit (HOSPITAL_COMMUNITY): Payer: Self-pay | Admitting: *Deleted

## 2021-10-17 ENCOUNTER — Telehealth (HOSPITAL_COMMUNITY): Payer: Self-pay | Admitting: Internal Medicine

## 2021-10-17 ENCOUNTER — Other Ambulatory Visit: Payer: Self-pay

## 2021-10-17 DIAGNOSIS — I4819 Other persistent atrial fibrillation: Secondary | ICD-10-CM

## 2021-10-17 MED ORDER — APIXABAN 5 MG PO TABS: 5.0000 mg | ORAL_TABLET | Freq: Two times a day (BID) | ORAL | 1 refills | Status: DC

## 2021-10-17 NOTE — Telephone Encounter (Signed)
Prescription refill request for Eliquis received. Indication: Afib  Last office visit:08/09/21 (Bensimhon)  Scr: 1.16 (05/20/21)  Age: 76 Weight: 49.4kg  Appropriate dose and refill sent to requested pharmacy.

## 2021-10-17 NOTE — Telephone Encounter (Signed)
Pt is completely out of Eliquis, 5 MG TABS please send script to [facility], [place], [place]. Thanks

## 2021-11-28 ENCOUNTER — Emergency Department (HOSPITAL_COMMUNITY): Payer: 59

## 2021-11-28 ENCOUNTER — Observation Stay (HOSPITAL_COMMUNITY)
Admission: EM | Admit: 2021-11-28 | Discharge: 2021-11-30 | Disposition: A | Discharge status: Home / Self Care | Payer: 59 | Attending: Internal Medicine | Admitting: Internal Medicine

## 2021-11-28 ENCOUNTER — Other Ambulatory Visit: Payer: Self-pay

## 2021-11-28 ENCOUNTER — Encounter (HOSPITAL_COMMUNITY): Payer: Self-pay

## 2021-11-28 DIAGNOSIS — R9389 Abnormal findings on diagnostic imaging of other specified body structures: Secondary | ICD-10-CM | POA: Diagnosis present

## 2021-11-28 DIAGNOSIS — R002 Palpitations: Secondary | ICD-10-CM | POA: Diagnosis not present

## 2021-11-28 DIAGNOSIS — I071 Rheumatic tricuspid insufficiency: Secondary | ICD-10-CM | POA: Insufficient documentation

## 2021-11-28 DIAGNOSIS — I4891 Unspecified atrial fibrillation: Secondary | ICD-10-CM | POA: Diagnosis not present

## 2021-11-28 DIAGNOSIS — J449 Chronic obstructive pulmonary disease, unspecified: Secondary | ICD-10-CM | POA: Diagnosis not present

## 2021-11-28 DIAGNOSIS — I480 Paroxysmal atrial fibrillation: Secondary | ICD-10-CM

## 2021-11-28 DIAGNOSIS — J439 Emphysema, unspecified: Secondary | ICD-10-CM | POA: Diagnosis not present

## 2021-11-28 DIAGNOSIS — E785 Hyperlipidemia, unspecified: Secondary | ICD-10-CM | POA: Diagnosis not present

## 2021-11-28 DIAGNOSIS — R918 Other nonspecific abnormal finding of lung field: Secondary | ICD-10-CM | POA: Insufficient documentation

## 2021-11-28 DIAGNOSIS — E876 Hypokalemia: Secondary | ICD-10-CM | POA: Insufficient documentation

## 2021-11-28 DIAGNOSIS — I5023 Acute on chronic systolic (congestive) heart failure: Secondary | ICD-10-CM | POA: Diagnosis not present

## 2021-11-28 DIAGNOSIS — F17210 Nicotine dependence, cigarettes, uncomplicated: Secondary | ICD-10-CM | POA: Insufficient documentation

## 2021-11-28 DIAGNOSIS — I499 Cardiac arrhythmia, unspecified: Secondary | ICD-10-CM | POA: Diagnosis not present

## 2021-11-28 DIAGNOSIS — R531 Weakness: Secondary | ICD-10-CM | POA: Diagnosis not present

## 2021-11-28 DIAGNOSIS — I130 Hypertensive heart and chronic kidney disease with heart failure and stage 1 through stage 4 chronic kidney disease, or unspecified chronic kidney disease: Secondary | ICD-10-CM | POA: Insufficient documentation

## 2021-11-28 DIAGNOSIS — N189 Chronic kidney disease, unspecified: Secondary | ICD-10-CM | POA: Diagnosis present

## 2021-11-28 DIAGNOSIS — Z7901 Long term (current) use of anticoagulants: Secondary | ICD-10-CM | POA: Insufficient documentation

## 2021-11-28 DIAGNOSIS — N1831 Chronic kidney disease, stage 3a: Secondary | ICD-10-CM | POA: Insufficient documentation

## 2021-11-28 DIAGNOSIS — G3184 Mild cognitive impairment, so stated: Secondary | ICD-10-CM | POA: Diagnosis present

## 2021-11-28 DIAGNOSIS — J441 Chronic obstructive pulmonary disease with (acute) exacerbation: Secondary | ICD-10-CM | POA: Diagnosis present

## 2021-11-28 DIAGNOSIS — N179 Acute kidney failure, unspecified: Secondary | ICD-10-CM | POA: Diagnosis not present

## 2021-11-28 DIAGNOSIS — Z66 Do not resuscitate: Secondary | ICD-10-CM | POA: Diagnosis present

## 2021-11-28 DIAGNOSIS — Z79899 Other long term (current) drug therapy: Secondary | ICD-10-CM | POA: Diagnosis not present

## 2021-11-28 DIAGNOSIS — I5022 Chronic systolic (congestive) heart failure: Secondary | ICD-10-CM | POA: Diagnosis present

## 2021-11-28 DIAGNOSIS — J90 Pleural effusion, not elsewhere classified: Secondary | ICD-10-CM | POA: Diagnosis not present

## 2021-11-28 DIAGNOSIS — Z743 Need for continuous supervision: Secondary | ICD-10-CM | POA: Diagnosis not present

## 2021-11-28 DIAGNOSIS — H3530 Unspecified macular degeneration: Secondary | ICD-10-CM | POA: Diagnosis present

## 2021-11-28 HISTORY — DX: Paroxysmal atrial fibrillation: I48.0

## 2021-11-28 LAB — CBC
HCT: 31.9 % — ABNORMAL LOW (ref 36.0–46.0)
Hemoglobin: 10.1 g/dL — ABNORMAL LOW (ref 12.0–15.0)
MCH: 32.2 pg (ref 26.0–34.0)
MCHC: 31.7 g/dL (ref 30.0–36.0)
MCV: 101.6 fL — ABNORMAL HIGH (ref 80.0–100.0)
Platelets: 274 10*3/uL (ref 150–400)
RBC: 3.14 MIL/uL — ABNORMAL LOW (ref 3.87–5.11)
RDW: 13 % (ref 11.5–15.5)
WBC: 6.5 10*3/uL (ref 4.0–10.5)
nRBC: 0 % (ref 0.0–0.2)

## 2021-11-28 LAB — BASIC METABOLIC PANEL
Anion gap: 9 (ref 5–15)
BUN: 20 mg/dL (ref 8–23)
CO2: 24 mmol/L (ref 22–32)
Calcium: 9 mg/dL (ref 8.9–10.3)
Chloride: 107 mmol/L (ref 98–111)
Creatinine, Ser: 1.63 mg/dL — ABNORMAL HIGH (ref 0.44–1.00)
GFR, Estimated: 32 mL/min — ABNORMAL LOW (ref >60)
Glucose, Bld: 94 mg/dL (ref 70–99)
Potassium: 3.4 mmol/L — ABNORMAL LOW (ref 3.5–5.1)
Sodium: 140 mmol/L (ref 135–145)

## 2021-11-28 LAB — TROPONIN I (HIGH SENSITIVITY)
Troponin I (High Sensitivity): 21 ng/L — ABNORMAL HIGH (ref <18)
Troponin I (High Sensitivity): 24 ng/L — ABNORMAL HIGH (ref <18)

## 2021-11-28 LAB — TSH: TSH: 4.419 u[IU]/mL (ref 0.350–4.500)

## 2021-11-28 LAB — MAGNESIUM: Magnesium: 2.1 mg/dL (ref 1.7–2.4)

## 2021-11-28 LAB — BRAIN NATRIURETIC PEPTIDE: B Natriuretic Peptide: 1930.1 pg/mL — ABNORMAL HIGH (ref 0.0–100.0)

## 2021-11-28 MED ORDER — FUROSEMIDE 10 MG/ML IJ SOLN: 40.0000 mg | Freq: Two times a day (BID) | INTRAMUSCULAR | Status: DC

## 2021-11-28 MED ORDER — ACETAMINOPHEN 650 MG RE SUPP: 650.0000 mg | Freq: Four times a day (QID) | RECTAL | Status: DC | PRN

## 2021-11-28 MED ORDER — FUROSEMIDE 10 MG/ML IJ SOLN
20.0000 mg | Freq: Two times a day (BID) | INTRAMUSCULAR | Status: DC
  Administered 2021-11-28 – 2021-11-29 (×3): 20 mg via INTRAVENOUS
  Filled 2021-11-28 (×3): qty 2

## 2021-11-28 MED ORDER — PANTOPRAZOLE SODIUM 40 MG PO TBEC
40.0000 mg | DELAYED_RELEASE_TABLET | Freq: Two times a day (BID) | ORAL | Status: DC
  Administered 2021-11-28 – 2021-11-30 (×4): 40 mg via ORAL
  Filled 2021-11-28 (×4): qty 1

## 2021-11-28 MED ORDER — FUROSEMIDE 10 MG/ML IJ SOLN
20.0000 mg | Freq: Once | INTRAMUSCULAR | Status: AC
  Administered 2021-11-28: 20 mg via INTRAVENOUS
  Filled 2021-11-28: qty 2

## 2021-11-28 MED ORDER — ONDANSETRON HCL 4 MG/2ML IJ SOLN: 4.0000 mg | Freq: Four times a day (QID) | INTRAMUSCULAR | Status: DC | PRN

## 2021-11-28 MED ORDER — AMIODARONE HCL IN DEXTROSE 360-4.14 MG/200ML-% IV SOLN
30.0000 mg/h | INTRAVENOUS | Status: DC
  Administered 2021-11-28 – 2021-11-29 (×2): 30 mg/h via INTRAVENOUS
  Filled 2021-11-28: qty 200

## 2021-11-28 MED ORDER — APIXABAN 5 MG PO TABS: 5.0000 mg | ORAL_TABLET | Freq: Two times a day (BID) | ORAL | Status: DC

## 2021-11-28 MED ORDER — HYDRALAZINE HCL 20 MG/ML IJ SOLN: 5.0000 mg | INTRAMUSCULAR | Status: DC | PRN

## 2021-11-28 MED ORDER — DILTIAZEM HCL 25 MG/5ML IV SOLN
10.0000 mg | Freq: Once | INTRAVENOUS | Status: AC
  Administered 2021-11-28: 10 mg via INTRAVENOUS
  Filled 2021-11-28: qty 5

## 2021-11-28 MED ORDER — ACETAMINOPHEN 325 MG PO TABS: 650.0000 mg | ORAL_TABLET | Freq: Four times a day (QID) | ORAL | Status: DC | PRN

## 2021-11-28 MED ORDER — ATORVASTATIN CALCIUM 40 MG PO TABS
40.0000 mg | ORAL_TABLET | Freq: Every day | ORAL | Status: DC
  Administered 2021-11-29 – 2021-11-30 (×2): 40 mg via ORAL
  Filled 2021-11-28 (×2): qty 1

## 2021-11-28 MED ORDER — ALBUTEROL SULFATE (2.5 MG/3ML) 0.083% IN NEBU: 3.0000 mL | INHALATION_SOLUTION | RESPIRATORY_TRACT | Status: DC | PRN

## 2021-11-28 MED ORDER — SODIUM CHLORIDE 0.9% FLUSH
3.0000 mL | Freq: Two times a day (BID) | INTRAVENOUS | Status: DC
  Administered 2021-11-28 – 2021-11-30 (×4): 3 mL via INTRAVENOUS

## 2021-11-28 MED ORDER — APIXABAN 2.5 MG PO TABS
2.5000 mg | ORAL_TABLET | Freq: Two times a day (BID) | ORAL | Status: DC
  Administered 2021-11-28 – 2021-11-30 (×4): 2.5 mg via ORAL
  Filled 2021-11-28 (×5): qty 1

## 2021-11-28 MED ORDER — APIXABAN 5 MG PO TABS
5.0000 mg | ORAL_TABLET | Freq: Two times a day (BID) | ORAL | Status: DC
Start: 2021-11-28 — End: 2021-11-28

## 2021-11-28 MED ORDER — AMIODARONE HCL IN DEXTROSE 360-4.14 MG/200ML-% IV SOLN
60.0000 mg/h | INTRAVENOUS | Status: DC
  Administered 2021-11-28 (×2): 60 mg/h via INTRAVENOUS
  Filled 2021-11-28 (×2): qty 200

## 2021-11-28 MED ORDER — AMIODARONE LOAD VIA INFUSION
150.0000 mg | Freq: Once | INTRAVENOUS | Status: AC
  Administered 2021-11-28: 150 mg via INTRAVENOUS
  Filled 2021-11-28: qty 83.34

## 2021-11-28 MED ORDER — ONDANSETRON HCL 4 MG PO TABS: 4.0000 mg | ORAL_TABLET | Freq: Four times a day (QID) | ORAL | Status: DC | PRN

## 2021-11-28 MED ORDER — POTASSIUM CHLORIDE CRYS ER 20 MEQ PO TBCR
40.0000 meq | EXTENDED_RELEASE_TABLET | Freq: Once | ORAL | Status: AC
  Administered 2021-11-28: 40 meq via ORAL
  Filled 2021-11-28: qty 2

## 2021-11-28 NOTE — H&P (Signed)
History and Physical    Patient: Laurie James XNA:[id number] DOB: 10-28-1945 DOA: 11/28/2021 DOS: the patient was seen and examined on 11/28/2021 PCP: Georgann Housekeeper, MD  Patient coming from: Home - lives with husband; Burnetta Sabin, [phone]   Chief Complaint: weakness  HPI: Laurie James is a 76 y.o. female with medical history significant of COPD; afib on Eliquis; chronic systolic CHF; macular degeneration; and MCI presenting with generalized weakness. She reports that she was feeling weak.  EMS said her heart was racing.  She has been feeling weak "for a while now but really this past week."  +DOE.  Slight cough, some nasal congestion.  No CP.  +LE edema.  She takes Lasix prn at home.  Her cheeks are flushed when she is volume overloaded.  She does not lie flat, sleeps upright.  4-5 pound weight gain recently.    ER Course:  Afib with RVR, improved with Dilt.  Also with AKI, CHF exacerbation.  Prior EF 20%, recovering.  Has edema, JVD.  Cardiology requests [facility] admission.  No need for O2.       Review of Systems: As mentioned in the history of present illness. All other systems reviewed and are negative. Past Medical History:  Diagnosis Date   AMD (age related macular degeneration)    COPD (chronic obstructive pulmonary disease) (HCC)    Depression    Hiatal hernia    Intermittent low back pain    Memory impairment    Midsternal chest pain    a. 12/2011 Cardiac CTA Ca++ score of 103.3 (80th %), LAD <50p/m, RCA 50-75.   Osteoporosis    PAF (paroxysmal atrial fibrillation) (HCC) 09/2020   Vitamin B 12 deficiency    Past Surgical History:  Procedure Laterality Date   ABDOMINAL HYSTERECTOMY     IR THORACENTESIS ASP PLEURAL SPACE W/IMG GUIDE  10/20/2020   RIGHT/LEFT HEART CATH AND CORONARY ANGIOGRAPHY N/A 10/07/2020   Procedure: RIGHT/LEFT HEART CATH AND CORONARY ANGIOGRAPHY;  Surgeon: Runell Gess, MD;  Location: [facility] INVASIVE CV LAB;  Service:  Cardiovascular;  Laterality: N/A;   TEE WITHOUT CARDIOVERSION N/A 10/25/2020   Procedure: TRANSESOPHAGEAL ECHOCARDIOGRAM (TEE);  Surgeon: Quintella Reichert, MD;  [facility] ENDOSCOPY;  Service: Cardiovascular;  Laterality: N/A;   Social History:  reports that she quit smoking about 14 months ago. Her smoking use included cigarettes. She has a 65.00 pack-year smoking history. She has never used smokeless tobacco. She reports that she does not drink alcohol and does not use drugs.  Allergies  Allergen Reactions   Codeine Nausea And Vomiting    Family History  Problem Relation Age of Onset   Breast cancer Sister    Breast cancer Sister     Prior to Admission medications   Medication Sig Start Date End Date Taking? Authorizing Provider  albuterol (VENTOLIN HFA) 108 (90 Base) MCG/ACT inhaler Inhale 2 puffs into the lungs every 4 (four) hours as needed for wheezing. 06/08/20   [provider]  apixaban (ELIQUIS) 5 MG TABS tablet Take 1 tablet (5 mg total) by mouth 2 (two) times daily. 10/17/21   Bensimhon, Bevelyn Buckles, MD  atorvastatin (LIPITOR) 40 MG tablet Take 1 tablet by mouth once daily 07/26/21   Wendall Stade, MD  cyanocobalamin (,VITAMIN B-12,) 1000 MCG/ML injection Inject 1,000 mcg into the muscle every 30 (thirty) days. 06/11/16   [provider]  empagliflozin (JARDIANCE) 10 MG TABS tablet Take 1 tablet (10 mg total) by mouth  daily before breakfast. 09/20/21   Bensimhon, Shaune Pascal, MD  ENTRESTO 24-26 MG Take 1 tablet by mouth twice daily 07/25/21   Bensimhon, Shaune Pascal, MD  furosemide (LASIX) 20 MG tablet Take 1 tablet (20 mg total) by mouth as needed. Leg edema 11/04/20   Clegg, Amy D, NP  nitroGLYCERIN (NITROSTAT) 0.4 MG SL tablet Place 1 tablet (0.4 mg total) under the tongue every 5 (five) minutes x 3 doses as needed for chest pain. 10/11/20   Tommie Raymond, NP  ondansetron (ZOFRAN) 4 MG tablet Take 4 mg by mouth 3 (three) times daily as needed. 11/22/20   [provider]  pantoprazole (PROTONIX) 40 MG tablet Take 1 tablet (40 mg total) by mouth 2 (two) times daily. 08/23/21   Bensimhon, Shaune Pascal, MD    Physical Exam: Vitals:   11/28/21 1500 11/28/21 1515 11/28/21 1615 11/28/21 1648  BP: 123/78 (!) 139/107 (!) 130/92   Pulse: 81 87 72   Resp: (!) 21 18 20    Temp:    97.8 F (36.6 C)  TempSrc:    Oral  SpO2: 100% 96% 100%   Weight:      Height:       General:  Appears calm and comfortable and is in NAD, on RA Eyes:  normal lids, iris; does not make eye contact due to apparent visual impairment from macular degeneration ENT:  grossly normal hearing, lips & tongue, mmm; artificial upper and absent lower dentition Neck:  no LAD, masses or thyromegaly; + JVD Cardiovascular:  RRR, no m/r/g. 2+ L>R LE edema.  Respiratory:   CTA bilaterally with no wheezes/rales/rhonchi.  Normal respiratory effort. Abdomen:  soft, NT, ND Skin:  no rash or induration seen on limited exam Musculoskeletal:  grossly normal tone BUE/BLE, good ROM, no bony abnormality Psychiatric:  grossly normal mood and affect, speech fluent and appropriate, AOx3 Neurologic:  CN 2-12 grossly intact, moves all extremities in coordinated fashion   Radiological Exams on Admission: Independently reviewed - see discussion in A/P where applicable  DG Chest 2 View  Result Date: 11/28/2021 CLINICAL DATA:  Palpitations EXAM: CHEST - 2 VIEW COMPARISON:  Chest x-ray dated March 21, 2021 FINDINGS: Cardiac and mediastinal contours are unchanged. Stable biapical pleural-parenchymal scarring. Small right pleural effusion, unchanged when compared with the prior. Mild bibasilar linear opacities, likely due to scarring or atelectasis. Background emphysema. Vague nodular opacity of the left upper lobe which is new when compared with prior exam. No evidence of pneumothorax. IMPRESSION: 1. Indeterminate vague nodular opacity of the left upper lobe which is new when compared with prior exam.  Recommend chest CT to evaluate for pulmonary nodule. 2. Unchanged small right pleural effusion. Electronically Signed   By: Yetta Glassman M.D.   On: 11/28/2021 12:06    EKG: Independently reviewed.  Afib with rate 130; nonspecific ST changes that are concerning for ischemia   Labs on Admission: I have personally reviewed the available labs and imaging studies at the time of the admission.  Pertinent labs:    K+ 3.4 BUN 20/Creatinine 1.63/GFR 32; 15/1.16/49 on 2/10 BNP 1930.1; 863.2 on 03/21/21 HS troponin 21, 24 WBC 6.5 Hgb 10.1 - stable   Assessment and Plan: Principal Problem:   Acute on chronic systolic (congestive) heart failure (HCC) Active Problems:   COPD (chronic obstructive pulmonary disease) (HCC)   HLD (hyperlipidemia)   Paroxysmal atrial fibrillation with RVR (HCC)   Acute kidney injury superimposed on chronic kidney disease (HCC)   Abnormal  chest x-ray   Macular degeneration   MCI (mild cognitive impairment)   DNR (do not resuscitate)    Acute on chronic systolic CHF -Patient with known h/o chronic systolic CHF presenting with worsening SOB and edema -Prior echo in 09/2021 showed EF 45-50% -CXR without obvious pulmonary edema -Mildly elevated BNP compared to prior -Mild acute decompensated CHF seems probable as diagnosis, likely exacerbated by afib with RVR -Will place in observation status with telemetry, as there are no current findings necessitating admission  -Will request echocardiogram -CHF order set utilized -Cardiology consulted -Was given Lasix 20 mg x 1 in ER and will repeat with 20 mg IV BID -Hold Jardiance and Entresto for now given AKI  Atrial Fibrillation with RVR -Patient with known afib; RVR is likely contributing to CHF exacerbation -Will plan to place in observation status in progressive care for amiodarone as per cardiology; this was previously stopped due to severe COPD -Continue home Eliquis (dose adjusted due to renal  dysfunction).  AKI on stage 3a CKD -Mild worsening compared to baseline - likely associated with decreased renal perfusion in the setting of RVR and CHF -Will trend with diuresis and rate control  Abnormal CXR -Consider inpatient vs. Outpatient CT to evaluate possible nodule  COPD -Quit smoking a year ago -Continue albuterol prn  Macular degeneration -She is not apparently on eye drops at this time  MCI -Not evident on admission -Delirium precautions ordered  HLD -Continue Lipitor  DNR -I have discussed code status with the patient and her daughter and  they are in agreement that the patient would not desire resuscitation and would prefer to die a natural death should that situation arise. -She will need a gold out of facility DNR form at the time of discharge    Advance Care Planning:   Code Status: DNR   Consults: Cardiology; CHF navigator; [facility] team  DVT Prophylaxis: Eliquis  Family Communication: Daughter was present throughout evaluation  Severity of Illness: The appropriate patient status for this patient is OBSERVATION. Observation status is judged to be reasonable and necessary in order to provide the required intensity of service to ensure the patient's safety. The patient's presenting symptoms, physical exam findings, and initial radiographic and laboratory data in the context of their medical condition is felt to place them at decreased risk for further clinical deterioration. Furthermore, it is anticipated that the patient will be medically stable for discharge from the hospital within 2 midnights of admission.   Author: Jonah Blue, MD 11/28/2021 6:37 PM  For on call review [email or web address].

## 2021-11-28 NOTE — ED Notes (Signed)
Patient needed to go to bathroom after receiving IV lasix. This RN recommended patient to use a bed pan or purewick instead of getting up and using the bathroom, as her heart rate is 120's. Patient stated she did not care and that her heart rate is normally in the 150's. NT [facility] waiting outside of bathroom door for patient.

## 2021-11-28 NOTE — ED Provider Notes (Signed)
[facility] EMERGENCY DEPARTMENT Provider Note   CSN: [id number] Arrival date & time: 11/28/21  1103     History  Chief Complaint  Patient presents with   Weakness    Laurie James is a 76 y.o. female.  HPI 76 year old female presents with weakness and elevated HR. started this morning but has been on and off problem for months.  This morning her heart rate was bouncing up and down it was really high and she was feeling overall weak.  She denies chest pain or shortness of breath.  She feels like her chronic leg swelling is less than it normally is today.  For a few months she has been getting full easily and not eating as well.  She has a chronic cough but no new cough.  No fevers.  Home Medications Prior to Admission medications   Medication Sig Start Date End Date Taking? Authorizing Provider  albuterol (VENTOLIN HFA) 108 (90 Base) MCG/ACT inhaler Inhale 2 puffs into the lungs every 4 (four) hours as needed for wheezing. 06/08/20   [provider]  apixaban (ELIQUIS) 5 MG TABS tablet Take 1 tablet (5 mg total) by mouth 2 (two) times daily. 10/17/21   Bensimhon, Bevelyn Buckles, MD  atorvastatin (LIPITOR) 40 MG tablet Take 1 tablet by mouth once daily 07/26/21   Wendall Stade, MD  cyanocobalamin (,VITAMIN B-12,) 1000 MCG/ML injection Inject 1,000 mcg into the muscle every 30 (thirty) days. 06/11/16   [provider]  empagliflozin (JARDIANCE) 10 MG TABS tablet Take 1 tablet (10 mg total) by mouth daily before breakfast. 09/20/21   Bensimhon, Bevelyn Buckles, MD  ENTRESTO 24-26 MG Take 1 tablet by mouth twice daily 07/25/21   Bensimhon, Bevelyn Buckles, MD  furosemide (LASIX) 20 MG tablet Take 1 tablet (20 mg total) by mouth as needed. Leg edema 11/04/20   Clegg, Amy D, NP  nitroGLYCERIN (NITROSTAT) 0.4 MG SL tablet Place 1 tablet (0.4 mg total) under the tongue every 5 (five) minutes x 3 doses as needed for chest pain. 10/11/20   Filbert Schilder, NP  ondansetron  (ZOFRAN) 4 MG tablet Take 4 mg by mouth 3 (three) times daily as needed. 11/22/20   [provider]  pantoprazole (PROTONIX) 40 MG tablet Take 1 tablet (40 mg total) by mouth 2 (two) times daily. 08/23/21   Bensimhon, Bevelyn Buckles, MD      Allergies    Codeine    Review of Systems   Review of Systems  Constitutional:  Negative for fever.  Respiratory:  Negative for shortness of breath.   Cardiovascular:  Positive for palpitations and leg swelling. Negative for chest pain.    Physical Exam Updated Vital Signs BP (!) 116/91 (BP Location: Left Arm)   Pulse 61   Temp 97.8 F (36.6 C) (Oral)   Resp (!) 22   Ht 4\' 8"  (1.422 m)   Wt 49.4 kg   SpO2 100%   BMI 24.44 kg/m  Physical Exam Vitals and nursing note reviewed.  Constitutional:      Appearance: She is well-developed.  HENT:     Head: Normocephalic and atraumatic.  Neck:     Vascular: JVD present.  Cardiovascular:     Rate and Rhythm: Tachycardia present. Rhythm irregular.     Heart sounds: Normal heart sounds.  Pulmonary:     Effort: Pulmonary effort is normal.     Breath sounds: Normal breath sounds.  Abdominal:  Palpations: Abdomen is soft.     Tenderness: There is no abdominal tenderness.  Musculoskeletal:     Right lower leg: Edema present.     Left lower leg: Edema present.     Comments: Both ankles/feet are swollen. Patient feels like this is better than it was yesterday.  Skin:    General: Skin is warm and dry.  Neurological:     Mental Status: She is alert.     ED Results / Procedures / Treatments   Labs (all labs ordered are listed, but only abnormal results are displayed) Labs Reviewed  BASIC METABOLIC PANEL - Abnormal; Notable for the following components:      Result Value   Potassium 3.4 (*)    Creatinine, Ser 1.63 (*)    GFR, Estimated 32 (*)    All other components within normal limits  CBC - Abnormal; Notable for the following components:   RBC 3.14 (*)    Hemoglobin 10.1 (*)     HCT 31.9 (*)    MCV 101.6 (*)    All other components within normal limits  TROPONIN I (HIGH SENSITIVITY) - Abnormal; Notable for the following components:   Troponin I (High Sensitivity) 21 (*)    All other components within normal limits  MAGNESIUM  BRAIN NATRIURETIC PEPTIDE  TROPONIN I (HIGH SENSITIVITY)    EKG EKG Interpretation  Date/Time:  Monday November 28 2021 11:17:04 EDT Ventricular Rate:  130 PR Interval:    QRS Duration: 86 QT Interval:  294 QTC Calculation: 432 R Axis:   54 Text Interpretation: Atrial fibrillation with rapid ventricular response with premature ventricular or aberrantly conducted complexes Septal infarct , age undetermined Marked ST abnormality, possible inferior subendocardial injury Confirmed by Pricilla Loveless [phone]) on 11/28/2021 12:30:00 PM  Radiology DG Chest 2 View  Result Date: 11/28/2021 CLINICAL DATA:  Palpitations EXAM: CHEST - 2 VIEW COMPARISON:  Chest x-ray dated March 21, 2021 FINDINGS: Cardiac and mediastinal contours are unchanged. Stable biapical pleural-parenchymal scarring. Small right pleural effusion, unchanged when compared with the prior. Mild bibasilar linear opacities, likely due to scarring or atelectasis. Background emphysema. Vague nodular opacity of the left upper lobe which is new when compared with prior exam. No evidence of pneumothorax. IMPRESSION: 1. Indeterminate vague nodular opacity of the left upper lobe which is new when compared with prior exam. Recommend chest CT to evaluate for pulmonary nodule. 2. Unchanged small right pleural effusion. Electronically Signed   By: Allegra Lai M.D.   On: 11/28/2021 12:06    Procedures Procedures    Medications Ordered in ED Medications  diltiazem (CARDIZEM) injection 10 mg (has no administration in time range)    ED Course/ Medical Decision Making/ A&P                           Medical Decision Making Amount and/or Complexity of Data Reviewed Labs: ordered.     Details: Troponins mildly elevated but flat.  Kidney function at 1.63 is consistent with acute kidney injury.  BNP of 1900 is much higher than most recent of 800 last year. Radiology: independent interpretation performed.    Details: No pulmonary edema. ECG/medicine tests: independent interpretation performed.    Details: A-fib with RVR and chronic ST changes  Risk Prescription drug management. Decision regarding hospitalization.   From a rate control perspective she is doing better after a dose of IV diltiazem.  However overall she seems to have congestive heart failure exacerbation.  While she is not distressed, also has worsening kidney function.  Based on exam I think she is more fluid overloaded rather than a hypovolemia cause of her AKI.  We will give a dose of IV Lasix.  Cardiology consulted and they will see patient but would like medicine admission.  Discussed with Dr. Ophelia Charter for admission.        Final Clinical Impression(s) / ED Diagnoses Final diagnoses:  None    Rx / DC Orders ED Discharge Orders     None         Pricilla Loveless, MD 11/28/21 [phone]

## 2021-11-28 NOTE — ED Provider Triage Note (Signed)
Emergency Medicine Provider Triage Evaluation Note  Laurie James , a 76 y.o. female  was evaluated in triage.  Pt complains of increasing weakness and some fluid retention over the last 4 to 5 days.  Hx of A-fib, on Eliquis and Entresto.  Endorses palpitations as well.  Denies shortness of breath or chest pain.  Review of Systems  Positive:  Negative: See above  Physical Exam  BP (!) 119/107   Pulse 63   Temp 98.8 F (37.1 C)   Resp 16   Ht 4\' 8"  (1.422 m)   Wt 49.4 kg   SpO2 100%   BMI 24.44 kg/m  Gen:   Awake, no distress   Resp:  Normal effort  MSK:   Moves extremities without difficulty  Other:  Irregularly irregular, CTAB.  Abdomen soft, nontender.  Chest non-TTP.  Medical Decision Making  Medically screening exam initiated at 11:18 AM.  Appropriate orders placed.  Porcha Deblanc Zenovia Jarred Dubas was informed that the remainder of the evaluation will be completed by another provider, this initial triage assessment does not replace that evaluation, and the importance of remaining in the ED until their evaluation is complete.     Annie Paras, PA-C 11/28/21 1120

## 2021-11-28 NOTE — ED Notes (Signed)
Pt had 20g in right forearm from EMS.  Pt stated that it was hurting and this RN noticed the hematoma around the IV.  IV taken out and a new one started in her left AC.

## 2021-11-28 NOTE — Consult Note (Addendum)
Advanced Heart Failure Team Consult Note   PCP:  Georgann Housekeeper, MD  PCP-Cardiology: Charlton Haws, MD    AHF: Dr. Gala Romney   Reason for Consultation: Acute on Chronic Systolic Heart Failure and Atrial Fibrillation w/ RVR    HPI:    Pt is being seen for a/c CHF and atrial fibrillation w/ RVR, at the request of Jonah Blue, Internal Medicine.   Laurie James is a 76 y.o. woman with severe COPD, blindness, malnutrition/frailty, HTN, systolic HF due to NICM (diagnosed 7/22) and atrial fibrillation.    Admitted 6/22 with new onset AF. EF normal and RV dilated. Cath with nonobstructive CAD. Sent home with [facility] and rate control w/ plans for outpatient DCCV after 3 weeks of a/c.   Readmitted 7/22 with acute HF -> cardiogenic shock. TEE EF 20% RV moderately down RA massively dilated. Heavy smoke in RA/LA and aortic root. DCCV canceled. Initial Co-ox 38% and required milrinone. Had right sided thoracentesis on 07/13 and subsequent CXRs showed some reaccumulation. Diuresed with IV lasix. Po lasix PRN at discharge.  D/c weight 99 lbs. Ultimately spontaneously converted back to NSR.    Seen in HF Clinic in 10/22 for f/u. Mild progress. Weight up to 104. ReDS 26%. EKG showed NSR.    Echo 03/21/21 when back in NSR showed improved EF 55-60% RV dilated with normal function. Severe TR.   Seen in AHF 5/23 for f/u and was back in Afib w/ CVR (had been off amiodarone). She was asymptomatic. Plan was to f/u 4 wks later w/ f/u echo and plan for  re initiation of amiodarone followed by DCCV if EF was down. Echo 6/23 showed mildly reduced LVEF, 45-50%, RV normal, severe TR. I don't see that she ever followed up after her echo.   She now presents to the ED w/ complaint of progressive exertional weakness and dyspnea over the last week. Found to be in Afib w/ RVR in the 150s.  BNP also elevated at 1900. BMP shows AKI w/ SCr 1.63 (b/l 1.16), CO2 20. CXR no frank edema + for small uncharged rt pleural effusion.  There is also indeterminate vague nodular opacity of the left upper lobe which is new when compared with prior exam. Recommend chest CT to evaluate for pulmonary nodule.  K 3.4, Mg 2.1, WBC 6.5, hgb 10.1 c/w b/l. Hs trop 21>>24. SBPs low 100s-130s.   She was given bolus of IV Cardizem 10 mg x 1 for rate control + 20 mg IV Lasix and 40 mEq of KCl. AHF team consulted for  further management.   She reports full compliance w/ Eliquis. No missed doses in the last month. Denies CP.     Cardiac Studies: - Echo (10/06/20): EF 55-60% - TEE (7/22): EF 20% RV moderately down RA massively dilated. Heavy smoke in RA/LA and aortic root.  - Echo 12/22 EF 55-60%, RV normal, severe MR  - Echo (6/23) EF 45-50%, RV normal. Severe TR     - LHC (10/07/2020) showed mild 40% segmental RCA stenosis as well as 50 to 60% second diagonal branch stenosis   RHC 6/22 RA 3 PA 17/2 (8) PCWP 7 PVR  0.25 WU Fick 2.6/2.0 (LVEF was low)     Review of Systems: [y] = yes, [ ]  = no   General: Weight gain [ ] ; Weight loss [ ] ; Anorexia [ ] ; Fatigue [Y ]; Fever [ ] ; Chills [ ] ; Weakness [Y ]  Cardiac: Chest pain/pressure [ ] ; Resting SOB [ ] ;  Exertional SOB [Y ]; Orthopnea [ ] ; Pedal Edema [ ] ; Palpitations [ ] ; Syncope [ ] ; Presyncope [ ] ; Paroxysmal nocturnal dyspnea[ ]   Pulmonary: Cough [ ] ; Wheezing[ ] ; Hemoptysis[ ] ; Sputum [ ] ; Snoring [ ]   GI: Vomiting[ ] ; Dysphagia[ ] ; Melena[ ] ; Hematochezia [ ] ; Heartburn[ ] ; Abdominal pain [ ] ; Constipation [ ] ; Diarrhea [ ] ; BRBPR [ ]   GU: Hematuria[ ] ; Dysuria [ ] ; Nocturia[ ]   Vascular: Pain in legs with walking [ ] ; Pain in feet with lying flat [ ] ; Non-healing sores [ ] ; Stroke [ ] ; TIA [ ] ; Slurred speech [ ] ;  Neuro: Headaches[ ] ; Vertigo[ ] ; Seizures[ ] ; Paresthesias[ ] ;Blurred vision [ ] ; Diplopia [ ] ; Vision changes [ ]   Ortho/Skin: Arthritis [ ] ; Joint pain [ ] ; Muscle pain [ ] ; Joint swelling [ ] ; Back Pain [ ] ; Rash [ ]   Psych: Depression[ ] ; Anxiety[ ]   Heme:  Bleeding problems [ ] ; Clotting disorders [ ] ; Anemia [ ]   Endocrine: Diabetes [ ] ; Thyroid dysfunction[ ]    Home Medications Prior to Admission medications   Medication Sig Start Date End Date Taking? Authorizing Provider  albuterol (VENTOLIN HFA) 108 (90 Base) MCG/ACT inhaler Inhale 2 puffs into the lungs every 4 (four) hours as needed for wheezing. 06/08/20   [provider]  apixaban (ELIQUIS) 5 MG TABS tablet Take 1 tablet (5 mg total) by mouth 2 (two) times daily. 10/17/21   Teiana Hajduk, Bevelyn Buckles, MD  atorvastatin (LIPITOR) 40 MG tablet Take 1 tablet by mouth once daily 07/26/21   Wendall Stade, MD  cyanocobalamin (,VITAMIN B-12,) 1000 MCG/ML injection Inject 1,000 mcg into the muscle every 30 (thirty) days. 06/11/16   [provider]  empagliflozin (JARDIANCE) 10 MG TABS tablet Take 1 tablet (10 mg total) by mouth daily before breakfast. 09/20/21   Sharlisa Hollifield, Bevelyn Buckles, MD  ENTRESTO 24-26 MG Take 1 tablet by mouth twice daily 07/25/21   Korbyn Chopin, Bevelyn Buckles, MD  furosemide (LASIX) 20 MG tablet Take 1 tablet (20 mg total) by mouth as needed. Leg edema 11/04/20   Clegg, Amy D, NP  nitroGLYCERIN (NITROSTAT) 0.4 MG SL tablet Place 1 tablet (0.4 mg total) under the tongue every 5 (five) minutes x 3 doses as needed for chest pain. 10/11/20   Filbert Schilder, NP  ondansetron (ZOFRAN) 4 MG tablet Take 4 mg by mouth 3 (three) times daily as needed. 11/22/20   [provider]  pantoprazole (PROTONIX) 40 MG tablet Take 1 tablet (40 mg total) by mouth 2 (two) times daily. 08/23/21   Diago Haik, Bevelyn Buckles, MD    Past Medical History: Past Medical History:  Diagnosis Date   AMD (age related macular degeneration)    COPD (chronic obstructive pulmonary disease) (HCC)    Depression    Hiatal hernia    Intermittent low back pain    Memory impairment    Midsternal chest pain    a. 12/2011 Cardiac CTA Ca++ score of 103.3 (80th %), LAD <50p/m, RCA 50-75.   Osteoporosis    Vitamin B 12  deficiency     Past Surgical History: Past Surgical History:  Procedure Laterality Date   ABDOMINAL HYSTERECTOMY     IR THORACENTESIS ASP PLEURAL SPACE W/IMG GUIDE  10/20/2020   RIGHT/LEFT HEART CATH AND CORONARY ANGIOGRAPHY N/A 10/07/2020   Procedure: RIGHT/LEFT HEART CATH AND CORONARY ANGIOGRAPHY;  Surgeon: Runell Gess, MD;  Location: [facility] INVASIVE CV LAB;  Service: Cardiovascular;  Laterality: N/A;   TEE WITHOUT  CARDIOVERSION N/A 10/25/2020   Procedure: TRANSESOPHAGEAL ECHOCARDIOGRAM (TEE);  Surgeon: Quintella Reichert, MD;  Location: [facility] ENDOSCOPY;  Service: Cardiovascular;  Laterality: N/A;    Family History:  Family History  Problem Relation Age of Onset   Breast cancer Sister    Breast cancer Sister     Social History: Social History   Socioeconomic History   Marital status: Married    Spouse name: Not on file   Number of children: Not on file   Years of education: Not on file   Highest education level: Not on file  Occupational History   Not on file  Tobacco Use   Smoking status: Every Day    Packs/day: 1.00    Years: 50.00    Total pack years: 50.00    Types: Cigarettes   Smokeless tobacco: Never  Vaping Use   Vaping Use: Never used  Substance and Sexual Activity   Alcohol use: No    Alcohol/week: 0.0 standard drinks of alcohol    Comment: rarely   Drug use: No   Sexual activity: Not on file  Other Topics Concern   Not on file  Social History Narrative   Not on file   Social Determinants of Health   Financial Resource Strain: Not on file  Food Insecurity: Not on file  Transportation Needs: No Transportation Needs (10/28/2020)   PRAPARE - Administrator, Civil Service (Medical): No    Lack of Transportation (Non-Medical): No  Physical Activity: Not on file  Stress: Not on file  Social Connections: Not on file    Allergies:  Allergies  Allergen Reactions   Codeine Nausea And Vomiting    Objective:    Vital Signs:   Temp:  [97.8  F (36.6 C)-98.8 F (37.1 C)] 97.8 F (36.6 C) (08/21 1243) Pulse Rate:  [61-90] 87 (08/21 1515) Resp:  [16-22] 18 (08/21 1515) BP: (99-139)/(69-107) 139/107 (08/21 1515) SpO2:  [96 %-100 %] 96 % (08/21 1515) Weight:  [49.4 kg] 49.4 kg (08/21 1108)   Filed Weights   11/28/21 1108  Weight: 49.4 kg     Physical Exam     General: elderly Female. Face flushed appearance No respiratory difficulty HEENT: Normal Neck: Supple. JVD 6-7 cm. Carotids 2+ bilat; no bruits. No lymphadenopathy or thyromegaly appreciated. Cor: PMI nondisplaced. Irregularly irregular rhythm and tachy rate. 2/6 TR murmur  Lungs: decreased BS at the bases  Abdomen: Soft, nontender, nondistended. No hepatosplenomegaly. No bruits or masses. Good bowel sounds. Extremities: No cyanosis, clubbing, rash, trace b/l ankle edema Neuro: Alert & oriented x 3, cranial nerves grossly intact. moves all 4 extremities w/o difficulty. Affect pleasant.   Telemetry   Afib w/ RVR 120s   EKG   Afib 130 bpm w/ PVC personally reviewed   Labs     Basic Metabolic Panel: Recent Labs  Lab 11/28/21 1130  NA 140  K 3.4*  CL 107  CO2 24  GLUCOSE 94  BUN 20  CREATININE 1.63*  CALCIUM 9.0  MG 2.1    Liver Function Tests: No results for input(s): "AST", "ALT", "ALKPHOS", "BILITOT", "PROT", "ALBUMIN" in the last 168 hours. No results for input(s): "LIPASE", "AMYLASE" in the last 168 hours. No results for input(s): "AMMONIA" in the last 168 hours.  CBC: Recent Labs  Lab 11/28/21 1130  WBC 6.5  HGB 10.1*  HCT 31.9*  MCV 101.6*  PLT 274    Cardiac Enzymes: No results for input(s): "CKTOTAL", "CKMB", "CKMBINDEX", "TROPONINI" in the  last 168 hours.  BNP: BNP (last 3 results) Recent Labs    03/21/21 1000 11/28/21 1157  BNP 863.2* 1,930.1*    ProBNP (last 3 results) No results for input(s): "PROBNP" in the last 8760 hours.   CBG: No results for input(s): "GLUCAP" in the last 168 hours.  Coagulation  Studies: No results for input(s): "LABPROT", "INR" in the last 72 hours.  Imaging: DG Chest 2 View  Result Date: 11/28/2021 CLINICAL DATA:  Palpitations EXAM: CHEST - 2 VIEW COMPARISON:  Chest x-ray dated March 21, 2021 FINDINGS: Cardiac and mediastinal contours are unchanged. Stable biapical pleural-parenchymal scarring. Small right pleural effusion, unchanged when compared with the prior. Mild bibasilar linear opacities, likely due to scarring or atelectasis. Background emphysema. Vague nodular opacity of the left upper lobe which is new when compared with prior exam. No evidence of pneumothorax. IMPRESSION: 1. Indeterminate vague nodular opacity of the left upper lobe which is new when compared with prior exam. Recommend chest CT to evaluate for pulmonary nodule. 2. Unchanged small right pleural effusion. Electronically Signed   By: Allegra Lai M.D.   On: 11/28/2021 12:06     Patient Profile   76 y/o female w/ history of systolic heart failure due to nonischemic/ tachymediated CM from rapid Afib w/ prior hospitalization c/b cardiogenic shock and ultimate improvement in LVEF after restoration/maintain of NSR. Also w/ severe COPD, blindness, malnutrition/frailty, HTN, presenting to ED w/ a/c CHF, recurrent Afib w/ RVR and AKI.   Assessment/Plan   1. Afib w/ RVR - first diagnosed 09/2020, cath nonobstructive CAD. Chemical conversion w/ amiodarone - PO amiodarone ultimately discontinued (h/o severe COPD) - recurrent afib detected 5/23 but was well rate controlled - now w/ RVR up to 150s, symptomatic w/ weakness - reports full compliance w/ Eliquis. No misses doses in the last 30 days - start amio gtt, 150 mg bolus x 1 followed by gtt at 60/hr  - if no chemical conversion, will need DCCV - she does not tolerate Afib well (prior h/o tachy mediated CM and cardiogenic shock). Long term suppression w/ amiodarone not ideal w/ COPD. Will ask EP to see for possible ablation  - continue Eliquis  5 mg bid - keep K > 4.0 and Mg > 2.0   2. Acute on Chronic Systolic Heart Failure - NICM - Echo 6/22 EF 20% RV moderately down RA massively dilated. Tachymediated from rapid Afib. LHC w/ nonobstructive CAD - Echo 12/22 EF normalized w/ restoration and maintenance of NSR, 55-60%, RV normal - Echo 5/23 EF back down, 45-50% (recurrent Afib w/ CVR) - NYHA Class III + mild volume overload on exam, in setting of persistent Afib w/ now RVR - Agree w/ IV Lasix (20 mg given in ED) + good urinary response - restoration of NSR imperative. See plan above - repeat limited echo to r/o progressive LV dysfunction  - hold Entresto w/ AKI. IV hydralazine PRN for BP  - Continue Jardiance 10 mg daily   3. AKI - SCr 1.6 (b/l 1.1) - monitor w/ diuresis - ? Low output, if further rise will place PICC to check Co-ox   4. Hypokalemia - K 3.4 - K supp given - Keep K > 4.0 and Mg >2.0   5. Hypertension  - moderately elevated - holding Entresto w/AKI - PRN IV hydralazine   6. LUL Nodular Opacity - noted on CXR  - former smoker - further imaging/ w/u per primary team   7. Tricuspid Regurgitation - severe on prior  Echo and LHC - ? Candidate for percutaneous TV clip   8. Blindness    Robbie Lis, PA-C 11/28/2021, 3:45 PM  Advanced Heart Failure Team Pager [phone] (M-F; 7a - 5p)  Please contact [facility] for night-coverage after hours (4p -7a ) and weekends on [email or web address]   Patient seen and examined with the above-signed Advanced Practice Provider and/or Housestaff. I personally reviewed laboratory data, imaging studies and relevant notes. I independently examined the patient and formulated the important aspects of the plan. I have edited the note to reflect any of my changes or salient points. I have personally discussed the plan with the patient and/or family.  76 y/o woman as above with history of severe COPD, blindness, PAF and chronic systolic HF due to tachy-mediated CM with  recovered EF.   Recently seen in clinic and had recurrent AF. F/u showed EF beginning to drop again .  Presented to ER today with AF with RVR and recurrent HF. Hs trop negative  General:  Frail appearing. No resp difficulty HEENT: normal Neck: supple. 9-10 + prominent v waves Carotids 2+ bilat; no bruits. No lymphadenopathy or thryomegaly appreciated. Cor: PMI nondisplaced. Irregular tachy  No rubs, gallops or murmurs. Lungs: basilar crackles  Abdomen: soft, nontender, nondistended. No hepatosplenomegaly. No bruits or masses. Good bowel sounds. Extremities: no cyanosis, clubbing, rash, 1+ edema Neuro: alert & orientedx3, cranial nerves grossly intact. moves all 4 extremities w/o difficulty. Affect pleasant  She is not tolerating AF well and concern for potential recurrent tachy-induced-CM.  Mildly volume overloaded. Diurese gently. Start amio gtt. Repeat echo. Continue Eliquis (has not missed doses). Plan DC-CV tomorrow.   Arvilla Meres, MD  5:31 PM

## 2021-11-28 NOTE — ED Triage Notes (Signed)
Complaining of weakness and hx of afib.  Patient HR up to 156 in afib rhythm.  Patient on blood thinners.  20g RFA . CBG 117.

## 2021-11-29 ENCOUNTER — Observation Stay (HOSPITAL_BASED_OUTPATIENT_CLINIC_OR_DEPARTMENT_OTHER): Payer: 59

## 2021-11-29 DIAGNOSIS — I5023 Acute on chronic systolic (congestive) heart failure: Secondary | ICD-10-CM

## 2021-11-29 LAB — CBC
HCT: 27.5 % — ABNORMAL LOW (ref 36.0–46.0)
Hemoglobin: 9.2 g/dL — ABNORMAL LOW (ref 12.0–15.0)
MCH: 32.6 pg (ref 26.0–34.0)
MCHC: 33.5 g/dL (ref 30.0–36.0)
MCV: 97.5 fL (ref 80.0–100.0)
Platelets: 226 10*3/uL (ref 150–400)
RBC: 2.82 MIL/uL — ABNORMAL LOW (ref 3.87–5.11)
RDW: 13 % (ref 11.5–15.5)
WBC: 6.4 10*3/uL (ref 4.0–10.5)
nRBC: 0 % (ref 0.0–0.2)

## 2021-11-29 LAB — BASIC METABOLIC PANEL
Anion gap: 11 (ref 5–15)
BUN: 24 mg/dL — ABNORMAL HIGH (ref 8–23)
CO2: 22 mmol/L (ref 22–32)
Calcium: 8.6 mg/dL — ABNORMAL LOW (ref 8.9–10.3)
Chloride: 106 mmol/L (ref 98–111)
Creatinine, Ser: 1.58 mg/dL — ABNORMAL HIGH (ref 0.44–1.00)
GFR, Estimated: 34 mL/min — ABNORMAL LOW (ref >60)
Glucose, Bld: 101 mg/dL — ABNORMAL HIGH (ref 70–99)
Potassium: 3.9 mmol/L (ref 3.5–5.1)
Sodium: 139 mmol/L (ref 135–145)

## 2021-11-29 LAB — ECHOCARDIOGRAM COMPLETE
Area-P 1/2: 2.93 cm2
Calc EF: 39 %
Height: 56 in
MV M vel: 4.48 m/s
MV Peak grad: 80.3 mmHg
P 1/2 time: 542 msec
Radius: 0.3 cm
S' Lateral: 4 cm
Single Plane A2C EF: 36.9 %
Single Plane A4C EF: 41.4 %
Weight: 1840 oz

## 2021-11-29 MED ORDER — POTASSIUM CHLORIDE CRYS ER 20 MEQ PO TBCR: 40.0000 meq | EXTENDED_RELEASE_TABLET | Freq: Once | ORAL | Status: DC

## 2021-11-29 MED ORDER — AMIODARONE HCL IN DEXTROSE 360-4.14 MG/200ML-% IV SOLN
30.0000 mg/h | INTRAVENOUS | Status: DC
  Administered 2021-11-29 (×2): 30 mg/h via INTRAVENOUS
  Filled 2021-11-29 (×2): qty 200

## 2021-11-29 MED ORDER — AMIODARONE HCL 200 MG PO TABS
200.0000 mg | ORAL_TABLET | Freq: Two times a day (BID) | ORAL | Status: DC
  Administered 2021-11-29: 200 mg via ORAL
  Filled 2021-11-29: qty 1

## 2021-11-29 MED ORDER — POTASSIUM CHLORIDE CRYS ER 20 MEQ PO TBCR
20.0000 meq | EXTENDED_RELEASE_TABLET | Freq: Once | ORAL | Status: AC
  Administered 2021-11-29: 20 meq via ORAL
  Filled 2021-11-29: qty 1

## 2021-11-29 MED ORDER — ORAL CARE MOUTH RINSE
15.0000 mL | OROMUCOSAL | Status: DC | PRN
Start: 2021-11-29 — End: 2021-11-30

## 2021-11-29 NOTE — Progress Notes (Addendum)
Advanced Heart Failure Rounding Note  PCP-Cardiologist: Charlton Haws, MD   Subjective:   Presented to ED yesterday with weakness and dyspnea x1 week. Was in afib RVR in the 150's. IV Cardizem 10 mg x 1 for rate control + 20 mg IV Lasix and 40 mEq of KCl given while in ED   Amiodarone gtt started yesterday, chemically converted her out of RVR  Echo pending today  Feels much better today. Denies CP and SOB.    Objective:   Weight Range: 52.2 kg Body mass index is 25.78 kg/m.   Vital Signs:   Temp:  [97.8 F (36.6 C)-98.8 F (37.1 C)] 98.2 F (36.8 C) (08/22 0414) Pulse Rate:  [54-120] 61 (08/22 0414) Resp:  [16-22] 18 (08/21 2047) BP: (99-139)/(53-107) 116/69 (08/22 0414) SpO2:  [96 %-100 %] 100 % (08/22 0414) Weight:  [49.4 kg-52.2 kg] 52.2 kg (08/22 0500)    Weight change: Filed Weights   11/28/21 1108 11/29/21 0500  Weight: 49.4 kg 52.2 kg    Intake/Output:   Intake/Output Summary (Last 24 hours) at 11/29/2021 0744 Last data filed at 11/29/2021 0600 Gross per 24 hour  Intake 371.05 ml  Output 450 ml  Net -78.95 ml      Physical Exam    General:  chronically ill appearing. No respiratory difficulty HEENT: normal Neck: supple. No JVD. Carotids 2+ bilat; no bruits. No lymphadenopathy or thyromegaly appreciated. Cor: PMI nondisplaced. Regular rate & rhythm. TR murmur. No rubs, gallops. Lungs: clear Abdomen: soft, nontender, nondistended. No hepatosplenomegaly. No bruits or masses. Good bowel sounds. Extremities: no cyanosis, clubbing, rash, trace L ankle edema  Neuro: alert & oriented x 3, cranial nerves grossly intact. moves all 4 extremities w/o difficulty. Affect pleasant.   Telemetry   Sinus arrhythmia 60's (Personally reviewed)    EKG    Afib 130 bpm w/ PVC  Labs    CBC Recent Labs    11/28/21 1130 11/29/21 0338  WBC 6.5 6.4  HGB 10.1* 9.2*  HCT 31.9* 27.5*  MCV 101.6* 97.5  PLT 274 226   Basic Metabolic Panel Recent Labs     11/28/21 1130 11/29/21 0338  NA 140 139  K 3.4* 3.9  CL 107 106  CO2 24 22  GLUCOSE 94 101*  BUN 20 24*  CREATININE 1.63* 1.58*  CALCIUM 9.0 8.6*  MG 2.1  --    Liver Function Tests No results for input(s): "AST", "ALT", "ALKPHOS", "BILITOT", "PROT", "ALBUMIN" in the last 72 hours. No results for input(s): "LIPASE", "AMYLASE" in the last 72 hours. Cardiac Enzymes No results for input(s): "CKTOTAL", "CKMB", "CKMBINDEX", "TROPONINI" in the last 72 hours.  BNP: BNP (last 3 results) Recent Labs    03/21/21 1000 11/28/21 1157  BNP 863.2* 1,930.1*    ProBNP (last 3 results) No results for input(s): "PROBNP" in the last 8760 hours.   D-Dimer No results for input(s): "DDIMER" in the last 72 hours. Hemoglobin A1C No results for input(s): "HGBA1C" in the last 72 hours. Fasting Lipid Panel No results for input(s): "CHOL", "HDL", "LDLCALC", "TRIG", "CHOLHDL", "LDLDIRECT" in the last 72 hours. Thyroid Function Tests Recent Labs    11/28/21 1402  TSH 4.419    Other results:   Imaging    DG Chest 2 View  Result Date: 11/28/2021 CLINICAL DATA:  Palpitations EXAM: CHEST - 2 VIEW COMPARISON:  Chest x-ray dated March 21, 2021 FINDINGS: Cardiac and mediastinal contours are unchanged. Stable biapical pleural-parenchymal scarring. Small right pleural effusion, unchanged when  compared with the prior. Mild bibasilar linear opacities, likely due to scarring or atelectasis. Background emphysema. Vague nodular opacity of the left upper lobe which is new when compared with prior exam. No evidence of pneumothorax. IMPRESSION: 1. Indeterminate vague nodular opacity of the left upper lobe which is new when compared with prior exam. Recommend chest CT to evaluate for pulmonary nodule. 2. Unchanged small right pleural effusion. Electronically Signed   By: Allegra Lai M.D.   On: 11/28/2021 12:06     Medications:     Scheduled Medications:  apixaban  2.5 mg Oral BID   atorvastatin   40 mg Oral Daily   furosemide  20 mg Intravenous BID   pantoprazole  40 mg Oral BID   sodium chloride flush  3 mL Intravenous Q12H    Infusions:  amiodarone 30 mg/hr (11/29/21 0646)    PRN Medications: acetaminophen **OR** acetaminophen, albuterol, hydrALAZINE, ondansetron **OR** ondansetron (ZOFRAN) IV, mouth rinse    Patient Profile   76 y/o female w/ history of systolic heart failure due to nonischemic/ tachymediated CM from rapid Afib w/ prior hospitalization c/b cardiogenic shock and ultimate improvement in LVEF after restoration/maintain of NSR. Also w/ severe COPD, blindness, malnutrition/frailty, HTN, presenting to ED w/ a/c CHF, recurrent Afib w/ RVR and AKI.   Assessment/Plan   1. Afib w/ RVR - first diagnosed 09/2020, cath nonobstructive CAD. Chemical conversion w/ amiodarone - PO amiodarone ultimately discontinued (h/o severe COPD) - recurrent afib detected 5/23 but was well rate controlled - presented w/ RVR up to 150s, symptomatic w/ weakness - reports full compliance w/ Eliquis. No misses doses in the last 30 days - Continue Amiodarone gtt at 30/hr. Chemically converted out of RVR, rate in 60's. EKG pending. - she does not tolerate Afib well (prior h/o tachy mediated CM and cardiogenic shock). Long term suppression w/ amiodarone not ideal w/ COPD. EP to see for possible ablation  - continue Eliquis 5 mg bid - keep K > 4.0 and Mg > 2.0    2. Acute on Chronic Systolic Heart Failure - NICM - Echo 6/22 EF 20% RV moderately down RA massively dilated. Tachymediated from rapid Afib. LHC w/ nonobstructive CAD - Echo 12/22 EF normalized w/ restoration and maintenance of NSR, 55-60%, RV normal - Echo 5/23 EF back down, 45-50% (recurrent Afib w/ CVR) - NYHA Class III + mild volume overload on exam, in setting of persistent Afib w/ now RVR - Continue lasix 20mg  BID - repeat limited echo to r/o progressive LV dysfunction today - hold Entresto w/ AKI. IV hydralazine PRN for BP   - Continue Jardiance 10 mg daily    3. AKI - SCr 1.6-> 1.58 (b/l 1.1) - monitor w/ diuresis   4. Hypokalemia - K 3.9 - replace K  - Keep K > 4.0 and Mg >2.0    5. Hypertension  - moderately elevated - holding Entresto w/AKI - PRN IV hydralazine    6. LUL Nodular Opacity - noted on CXR  - former smoker - further imaging/ w/u per primary team    7. Tricuspid Regurgitation - severe on prior Echo and LHC - ? Candidate for percutaneous TV clip    8. Blindness   Length of Stay: 0  , AGACNP-BC  11/29/2021, 7:44 AM  Advanced Heart Failure Team Pager [phone] (M-F; 7a - 5p)  Please contact [facility] for night-coverage after hours (5p -7a ) and weekends on [email or web address]  Patient seen and examined with the above-signed  Advanced Practice Provider and/or Housestaff. I personally reviewed laboratory data, imaging studies and relevant notes. I independently examined the patient and formulated the important aspects of the plan. I have edited the note to reflect any of my changes or salient points. I have personally discussed the plan with the patient and/or family.  Back in NSR on IV amio. Diuresing well. SOB much improved. No orthopnea or PND.   General:  Sitting up in bed  No resp difficulty HEENT: normal Neck: supple. CVP 6-7 Carotids 2+ bilat; no bruits. No lymphadenopathy or thryomegaly appreciated. Cor: PMI nondisplaced. Regular rate & rhythm. No rubs, gallops or murmurs. Lungs: clear Abdomen: soft, nontender, nondistended. No hepatosplenomegaly. No bruits or masses. Good bowel sounds. Extremities: no cyanosis, clubbing, rash, tr edema Neuro: alert & orientedx3, cranial nerves grossly intact. moves all 4 extremities w/o difficulty. Affect pleasant  Will continue IV amio. Continue  iv lasix today. Repeat echo. Possibly home tomorrow.   Arvilla Meres, MD  5:24 PM

## 2021-11-29 NOTE — TOC Initial Note (Signed)
Transition of Care [facility]) - Initial/Assessment Note    Patient Details  Name: Laurie James MRN: [id number] Date of Birth: 1945-12-20  Transition [facility]) CM/SW Contact:    Elliot Cousin, RN Phone Number: [phone] 11/29/2021, 12:53 PM  Clinical Narrative:                 HF TOC CM spoke to pt at bedside. States dtr and husband assist her at home. States he has scale at home to weigh. And follow a low sodium heart healthy diet. Had [facility] in the past for [facility].   Expected Discharge Plan: Home/Self Care Barriers to Discharge: Continued Medical Work up   Patient Goals and CMS Choice Patient states their goals for this hospitalization and ongoing recovery are:: patient wants to remain independent CMS Medicare.gov Compare Post Acute Care list provided to:: Patient    Expected Discharge Plan and Services Expected Discharge Plan: Home/Self Care   Discharge Planning Services: CM Consult   Living arrangements for the past 2 months: Single Family Home   Prior Living Arrangements/Services Living arrangements for the past 2 months: Single Family Home Lives with:: Spouse Patient language and need for interpreter reviewed:: Yes Do you feel safe going back to the place where you live?: Yes      Need for Family Participation in Patient Care: No (Comment) Care giver support system in place?: Yes (comment) Current home services: DME (hospital bed, rollator, bedside commode) Criminal Activity/Legal Involvement Pertinent to Current Situation/Hospitalization: No - Comment as needed  Activities of Daily Living      Permission Sought/Granted Permission sought to share information with : Case Manager, Family Supports, PCP Permission granted to share information with : Yes, Verbal Permission Granted  Share Information with NAME: Kryssa Risenhoover     Permission granted to share info w Relationship: husband  Permission granted to share info w  Contact Information: [phone]  Emotional Assessment Appearance:: Appears stated age Attitude/Demeanor/Rapport: Engaged Affect (typically observed): Accepting Orientation: : Oriented to Self, Oriented to Place, Oriented to  Time, Oriented to Situation   Psych Involvement: No (comment)  Admission diagnosis:  Paroxysmal atrial fibrillation (HCC) [I48.0] Acute on chronic systolic congestive heart failure (HCC) [I50.23] Atrial fibrillation with RVR (HCC) [I48.91] Acute kidney injury (HCC) [N17.9] Acute on chronic systolic (congestive) heart failure (HCC) [I50.23] Patient Active Problem List   Diagnosis Date Noted   Acute on chronic systolic (congestive) heart failure (HCC) 11/28/2021   Paroxysmal atrial fibrillation with RVR (HCC) 11/28/2021   Acute kidney injury superimposed on chronic kidney disease (HCC) 11/28/2021   Abnormal chest x-ray 11/28/2021   Macular degeneration 11/28/2021   MCI (mild cognitive impairment) 11/28/2021   DNR (do not resuscitate) 11/28/2021   OSA (obstructive sleep apnea)    Protein-calorie malnutrition, severe 10/22/2020   Depression    Acute on chronic diastolic CHF (congestive heart failure) (HCC)    Bilateral pleural effusion 10/19/2020   Atrial fibrillation (HCC) 10/11/2020   Pulmonary HTN (HCC) 10/11/2020   Tricuspid regurgitation 10/11/2020   HLD (hyperlipidemia) 10/11/2020   Hypotension 10/11/2020   Hypokalemia 10/11/2020   Chest pain at rest 10/05/2020   CAD (coronary artery disease) 04/24/2014   COPD (chronic obstructive pulmonary disease) (HCC) 04/24/2014   PCP:  Georgann Housekeeper, MD Pharmacy:   [facility] [address], [place] - [address] [facility] [place] [address] Phone: [phone] Fax: [phone]     Social Determinants of Health (SDOH) Interventions  Readmission Risk Interventions     No data to display

## 2021-11-29 NOTE — Care Management Obs Status (Signed)
MEDICARE OBSERVATION STATUS NOTIFICATION   Patient Details  Name: Laurie James MRN: [id number] Date of Birth: 06/10/1945   Medicare Observation Status Notification Given:  Yes    Elliot Cousin, RN 11/29/2021, 12:12 PM

## 2021-11-29 NOTE — Progress Notes (Signed)
PROGRESS NOTE    Laurie James  [id number]:[id number] DOB: [birth year] DOA: 11/28/2021 PCP: Georgann Housekeeper, MD  Zenovia Jarred Laurie James is a 76 y.o. female with medical history significant of COPD; afib on Eliquis; chronic systolic CHF; macular degeneration; and MCI presented to the ED with generalized weakness, racing heart, dyspnea on exertion and scant edema.  In the ED she was noted to be in A-fib RVR, started on diltiazem gtt., also mild AKI and CHF  Subjective: -Feels better overall, breathing is improving, wants her family to bring the charger for her hearing aids  Assessment and Plan:  Acute on chronic systolic CHF Severe TR -Prior echo in 09/2021 showed EF 45-50% -Clinically appears close to euvolemic, advanced heart failure team following, on low-dose IV Lasix -Continue Jardiance, Entresto on hold, BMP in a.m. -Follow-up repeat echo   Atrial Fibrillation with RVR -Patient with known afib; RVR  likely contributing to CHF exacerbation -Started on amiodarone gtt. yesterday, converted to sinus rhythm this morning, poor candidate for long-term amiodarone with COPD -Continue Eliquis   AKI on stage 3a CKD -Mild worsening compared to baseline - likely associated with decreased renal perfusion in the setting of RVR and CHF -Will trend with diuresis and rate control   Abnormal CXR, possible nodule -Outpatient follow-up recommended   COPD -Stable, quit smoking 1 year ago -Continue albuterol prn   Macular degeneration Blindness   MCI -Not evident on admission -Delirium precautions ordered   HLD -Continue Lipitor     CODE STATUS:   Code Status: DNR    Consults: Cardiology;  DVT Prophylaxis: Eliquis  Family Communication: Discussed with patient in detail, no family at bedside Disposition Plan: Home likely 1 to 2 days  Consultants: Cardiology   Procedures:   Antimicrobials:    Objective: Vitals:   11/28/21 2312 11/29/21 0414 11/29/21 0500 11/29/21  0923  BP: (!) 108/53 116/69  107/74  Pulse: 68 61  63  Resp:    18  Temp:  98.2 F (36.8 C)  (!) 97.4 F (36.3 C)  TempSrc:  Oral  Oral  SpO2:  100%  100%  Weight:   52.2 kg   Height:        Intake/Output Summary (Last 24 hours) at 11/29/2021 1418 Last data filed at 11/29/2021 0600 Gross per 24 hour  Intake 371.05 ml  Output 450 ml  Net -78.95 ml   Filed Weights   11/28/21 1108 11/29/21 0500  Weight: 49.4 kg 52.2 kg    Examination:  General exam: Pleasant chronically ill blind female sitting up in bed, AAOx3, no distress HEENT: No JVD CVS: S1-S2, regular rhythm Lungs: Clear bilaterally Abdomen: Soft, nontender, bowel sounds present Extremities: Trace edema  Skin: No rashes Psychiatry:  Mood & affect appropriate.     Data Reviewed:   CBC: Recent Labs  Lab 11/28/21 1130 11/29/21 0338  WBC 6.5 6.4  HGB 10.1* 9.2*  HCT 31.9* 27.5*  MCV 101.6* 97.5  PLT 274 226   Basic Metabolic Panel: Recent Labs  Lab 11/28/21 1130 11/29/21 0338  NA 140 139  K 3.4* 3.9  CL 107 106  CO2 24 22  GLUCOSE 94 101*  BUN 20 24*  CREATININE 1.63* 1.58*  CALCIUM 9.0 8.6*  MG 2.1  --    GFR: Estimated Creatinine Clearance: 20.4 mL/min (A) (by C-G formula based on SCr of 1.58 mg/dL (H)). Liver Function Tests: No results for input(s): "AST", "ALT", "ALKPHOS", "BILITOT", "PROT", "ALBUMIN" in the last 168 hours. No  results for input(s): "LIPASE", "AMYLASE" in the last 168 hours. No results for input(s): "AMMONIA" in the last 168 hours. Coagulation Profile: No results for input(s): "INR", "PROTIME" in the last 168 hours. Cardiac Enzymes: No results for input(s): "CKTOTAL", "CKMB", "CKMBINDEX", "TROPONINI" in the last 168 hours. BNP (last 3 results) No results for input(s): "PROBNP" in the last 8760 hours. HbA1C: No results for input(s): "HGBA1C" in the last 72 hours. CBG: No results for input(s): "GLUCAP" in the last 168 hours. Lipid Profile: No results for input(s):  "CHOL", "HDL", "LDLCALC", "TRIG", "CHOLHDL", "LDLDIRECT" in the last 72 hours. Thyroid Function Tests: Recent Labs    11/28/21 1402  TSH 4.419   Anemia Panel: No results for input(s): "VITAMINB12", "FOLATE", "FERRITIN", "TIBC", "IRON", "RETICCTPCT" in the last 72 hours. Urine analysis:    Component Value Date/Time   COLORURINE YELLOW 05/20/2021 1117   APPEARANCEUR CLEAR 05/20/2021 1117   LABSPEC 1.010 05/20/2021 1117   PHURINE 6.0 05/20/2021 1117   GLUCOSEU >=500 (A) 05/20/2021 1117   HGBUR MODERATE (A) 05/20/2021 1117   BILIRUBINUR NEGATIVE 05/20/2021 1117   KETONESUR NEGATIVE 05/20/2021 1117   PROTEINUR 30 (A) 05/20/2021 1117   NITRITE NEGATIVE 05/20/2021 1117   LEUKOCYTESUR NEGATIVE 05/20/2021 1117   Sepsis Labs: @LABRCNTIP (procalcitonin:4,lacticidven:4)  )No results found for this or any previous visit (from the past 240 hour(s)).   Radiology Studies:)    DG Chest 2 View  Result Date: 11/28/2021 CLINICAL DATA:  Palpitations EXAM: CHEST - 2 VIEW COMPARISON:  Chest x-ray dated March 21, 2021 FINDINGS: Cardiac and mediastinal contours are unchanged. Stable biapical pleural-parenchymal scarring. Small right pleural effusion, unchanged when compared with the prior. Mild bibasilar linear opacities, likely due to scarring or atelectasis. Background emphysema. Vague nodular opacity of the left upper lobe which is new when compared with prior exam. No evidence of pneumothorax. IMPRESSION: 1. Indeterminate vague nodular opacity of the left upper lobe which is new when compared with prior exam. Recommend chest CT to evaluate for pulmonary nodule. 2. Unchanged small right pleural effusion. Electronically Signed   By: March 23, 2021 M.D.   On: 11/28/2021 12:06     Scheduled Meds:  apixaban  2.5 mg Oral BID   atorvastatin  40 mg Oral Daily   furosemide  20 mg Intravenous BID   pantoprazole  40 mg Oral BID   sodium chloride flush  3 mL Intravenous Q12H   Continuous Infusions:   amiodarone 30 mg/hr (11/29/21 1114)     LOS: 0 days    Time spent: 12/01/21   , MD [facility]   11/29/2021, 2:18 PM

## 2021-11-29 NOTE — Progress Notes (Signed)
  Echocardiogram 2D Echocardiogram has been performed.  Maren Reamer 11/29/2021, 10:40 AM

## 2021-11-29 NOTE — Progress Notes (Signed)
Heart Failure Navigator Progress Note  Assessed for Heart & Vascular TOC clinic readiness.  Patient does not meet criteria due to Advanced Heart Failure Team..     Augie Vane, BSN, RN Heart Failure Nurse Navigator Secure Chat Only   

## 2021-11-29 NOTE — Progress Notes (Signed)
Mobility Specialist - Progress Note   11/29/21 1506  Mobility  Activity Ambulated with assistance in hallway  Level of Assistance Contact guard assist, steadying assist  Assistive Device Other (Comment) (HHA)  Distance Ambulated (ft) 250 ft  Activity Response Tolerated well  $Mobility charge 1 Mobility    Pre-mobility: 118/71(82) BP During mobility: 97 HR Post-mobility: 86 HR, 121/67(82) BP  Pt was received in bed and agreeable to mobility. Pt c/o weakness in legs during. Noticeably SOB with exertion. Pt was returned to bed with all needs met.  Larey Seat

## 2021-11-30 ENCOUNTER — Other Ambulatory Visit (HOSPITAL_COMMUNITY): Payer: Self-pay

## 2021-11-30 DIAGNOSIS — R9389 Abnormal findings on diagnostic imaging of other specified body structures: Secondary | ICD-10-CM

## 2021-11-30 DIAGNOSIS — N179 Acute kidney failure, unspecified: Secondary | ICD-10-CM

## 2021-11-30 DIAGNOSIS — N189 Chronic kidney disease, unspecified: Secondary | ICD-10-CM | POA: Diagnosis not present

## 2021-11-30 DIAGNOSIS — J410 Simple chronic bronchitis: Secondary | ICD-10-CM

## 2021-11-30 DIAGNOSIS — I5023 Acute on chronic systolic (congestive) heart failure: Secondary | ICD-10-CM | POA: Diagnosis not present

## 2021-11-30 LAB — BASIC METABOLIC PANEL
Anion gap: 10 (ref 5–15)
BUN: 27 mg/dL — ABNORMAL HIGH (ref 8–23)
CO2: 23 mmol/L (ref 22–32)
Calcium: 8.8 mg/dL — ABNORMAL LOW (ref 8.9–10.3)
Chloride: 103 mmol/L (ref 98–111)
Creatinine, Ser: 1.79 mg/dL — ABNORMAL HIGH (ref 0.44–1.00)
GFR, Estimated: 29 mL/min — ABNORMAL LOW (ref >60)
Glucose, Bld: 91 mg/dL (ref 70–99)
Potassium: 3.8 mmol/L (ref 3.5–5.1)
Sodium: 136 mmol/L (ref 135–145)

## 2021-11-30 MED ORDER — APIXABAN 2.5 MG PO TABS
2.5000 mg | ORAL_TABLET | Freq: Two times a day (BID) | ORAL | 0 refills | Status: DC
  Filled 2021-11-30: qty 60, 30d supply, fill #0

## 2021-11-30 MED ORDER — AMIODARONE HCL 200 MG PO TABS
200.0000 mg | ORAL_TABLET | Freq: Two times a day (BID) | ORAL | Status: DC
  Administered 2021-11-30: 200 mg via ORAL
  Filled 2021-11-30: qty 1

## 2021-11-30 MED ORDER — AMIODARONE HCL 200 MG PO TABS
200.0000 mg | ORAL_TABLET | Freq: Two times a day (BID) | ORAL | 0 refills | Status: DC
  Filled 2021-11-30: qty 60, 30d supply, fill #0

## 2021-11-30 MED ORDER — POTASSIUM CHLORIDE CRYS ER 20 MEQ PO TBCR
40.0000 meq | EXTENDED_RELEASE_TABLET | Freq: Once | ORAL | Status: AC
Start: 2021-11-30 — End: 2021-11-30
  Administered 2021-11-30: 40 meq via ORAL
  Filled 2021-11-30: qty 2

## 2021-11-30 MED ORDER — FUROSEMIDE 20 MG PO TABS
20.0000 mg | ORAL_TABLET | Freq: Every day | ORAL | Status: DC
  Filled 2021-11-30: qty 1

## 2021-11-30 NOTE — Progress Notes (Addendum)
Advanced Heart Failure Rounding Note  PCP-Cardiologist: Jenkins Rouge, MD   Subjective:   Presented to ED yesterday with weakness and dyspnea x1 week. Was in afib RVR in the 150's. IV Cardizem 10 mg x 1 for rate control + 20 mg IV Lasix and 40 mEq of KCl given while in ED   Amiodarone gtt started 8/21, chemically converted out of RVR  Echo yesterday showed:  LVEF: 40-45%, RV function mildly reduced RV mod enlarged. L/R atria severely dilated. Mild MR, Mod TR  Feels much better today. Able to ambulate in hall yesterday. Denies CP and SOB.    Objective:   Weight Range: 50.8 kg Body mass index is 25.09 kg/m.   Vital Signs:   Temp:  [97.4 F (36.3 C)-98.2 F (36.8 C)] 98.2 F (36.8 C) (08/23 0352) Pulse Rate:  [57-69] 60 (08/23 0352) Resp:  [16-18] 17 (08/23 0352) BP: (107-127)/(63-76) 119/63 (08/23 0352) SpO2:  [100 %] 100 % (08/23 0352) Weight:  [50.8 kg] 50.8 kg (08/23 0358) Last BM Date : 11/29/21  Weight change: Filed Weights   11/28/21 1108 11/29/21 0500 11/30/21 0358  Weight: 49.4 kg 52.2 kg 50.8 kg    Intake/Output:   Intake/Output Summary (Last 24 hours) at 11/30/2021 0737 Last data filed at 11/30/2021 0500 Gross per 24 hour  Intake 344.3 ml  Output 1100 ml  Net -755.7 ml      Physical Exam    General:  well appearing. Looks stated age. No respiratory difficulty HEENT: slightly flushed face Neck: supple. JVD ~6-7 cm. Carotids 2+ bilat; no bruits. No lymphadenopathy or thyromegaly appreciated. Cor: PMI nondisplaced. Regular rate & rhythm. No rubs, gallops. TR murmur. Lungs: clear Abdomen: soft, nontender, nondistended. No hepatosplenomegaly. No bruits or masses. Good bowel sounds. Extremities: no cyanosis, clubbing, rash, edema  Neuro: alert & oriented x 3, cranial nerves grossly intact. moves all 4 extremities w/o difficulty. Affect pleasant.   Telemetry   SB/NSR 58- 60's, PVC's 0-1/hr (Personally reviewed)    EKG   No new EKG to  review  Labs    CBC Recent Labs    11/28/21 1130 11/29/21 0338  WBC 6.5 6.4  HGB 10.1* 9.2*  HCT 31.9* 27.5*  MCV 101.6* 97.5  PLT 274 [id number]   Basic Metabolic Panel Recent Labs    11/28/21 1130 11/29/21 0338 11/30/21 0431  NA 140 139 136  K 3.4* 3.9 3.8  CL 107 106 103  CO2 24 22 23   GLUCOSE 94 101* 91  BUN 20 24* 27*  CREATININE 1.63* 1.58* 1.79*  CALCIUM 9.0 8.6* 8.8*  MG 2.1  --   --    Liver Function Tests No results for input(s): "AST", "ALT", "ALKPHOS", "BILITOT", "PROT", "ALBUMIN" in the last 72 hours. No results for input(s): "LIPASE", "AMYLASE" in the last 72 hours. Cardiac Enzymes No results for input(s): "CKTOTAL", "CKMB", "CKMBINDEX", "TROPONINI" in the last 72 hours.  BNP: BNP (last 3 results) Recent Labs    03/21/21 1000 11/28/21 1157  BNP 863.2* 1,930.1*    ProBNP (last 3 results) No results for input(s): "PROBNP" in the last 8760 hours.   D-Dimer No results for input(s): "DDIMER" in the last 72 hours. Hemoglobin A1C No results for input(s): "HGBA1C" in the last 72 hours. Fasting Lipid Panel No results for input(s): "CHOL", "HDL", "LDLCALC", "TRIG", "CHOLHDL", "LDLDIRECT" in the last 72 hours. Thyroid Function Tests Recent Labs    11/28/21 1402  TSH 4.419    Other results:   Imaging  ECHOCARDIOGRAM COMPLETE  Result Date: 11/29/2021    ECHOCARDIOGRAM REPORT   Patient Name:   Laurie James Date of Exam: 11/29/2021 Medical Rec #:  [id number]                   Height:       56.0 in Accession #:    [id number]                  Weight:       115.0 lb Date of Birth:  12-23-1945                   BSA:          1.403 m Patient Age:    76 years                    BP:           116/69 mmHg Patient Gender: F                           HR:           66 bpm. Exam Location:  Inpatient Procedure: 2D Echo, Color Doppler and Cardiac Doppler Indications:     CHF  History:         Patient has prior history of Echocardiogram examinations,  most                  recent 09/09/2021. COPD; Arrythmias:Atrial Fibrillation.  Sonographer:     Greer Pickerel Sonographer#2:   Bernadene Person RDCS Referring Phys:  [place] Diagnosing Phys: [place] IMPRESSIONS  1. Hypokinetic basal-mid anterior/anteroseptal wall. Left ventricular ejection fraction, by estimation, is 40 to 45%. The left ventricle has mildly decreased function. The left ventricle demonstrates global hypokinesis. Left ventricular diastolic parameters are consistent with Grade II diastolic dysfunction (pseudonormalization).  2. Right ventricular systolic function is mildly reduced. The right ventricular size is moderately enlarged. There is moderately elevated pulmonary artery systolic pressure. The estimated right ventricular systolic pressure is 0000000 mmHg.  3. Left atrial size was severely dilated.  4. Right atrial size was severely dilated.  5. The mitral valve is normal in structure. Mild mitral valve regurgitation.  6. Tricuspid valve regurgitation is moderate.  7. The aortic valve was not well visualized. Aortic valve regurgitation is trivial.  8. The inferior vena cava is dilated in size with >50% respiratory variability, suggesting right atrial pressure of 8 mmHg. Comparison(s): No significant change from prior study. FINDINGS  Left Ventricle: Hypokinetic basal-mid anterior/anteroseptal wall. Left ventricular ejection fraction, by estimation, is 40 to 45%. The left ventricle has mildly decreased function. The left ventricle demonstrates global hypokinesis. The left ventricular  internal cavity size was normal in size. There is no left ventricular hypertrophy. Left ventricular diastolic parameters are consistent with Grade II diastolic dysfunction (pseudonormalization). Right Ventricle: The right ventricular size is moderately enlarged. Right ventricular systolic function is mildly reduced. There is moderately elevated pulmonary artery systolic pressure. The tricuspid regurgitant  velocity is 3.39 m/s, and with an assumed right atrial pressure of 8 mmHg, the estimated right ventricular systolic pressure is 0000000 mmHg. Left Atrium: Left atrial size was severely dilated. Right Atrium: Right atrial size was severely dilated. Pericardium: Trivial pericardial effusion is present. Mitral Valve: The mitral valve is normal in structure. Mild mitral valve regurgitation. Tricuspid Valve: The tricuspid valve is normal in structure. Tricuspid valve regurgitation is  moderate. Aortic Valve: The aortic valve was not well visualized. Aortic valve regurgitation is trivial. Aortic regurgitation PHT measures 542 msec. Pulmonic Valve: The pulmonic valve was normal in structure. Pulmonic valve regurgitation is not visualized. Aorta: The aortic root and ascending aorta are structurally normal, with no evidence of dilitation. Venous: The inferior vena cava is dilated in size with greater than 50% respiratory variability, suggesting right atrial pressure of 8 mmHg.  LEFT VENTRICLE PLAX 2D LVIDd:         4.50 cm     Diastology LVIDs:         4.00 cm     LV e' medial:    6.32 cm/s LV PW:         0.80 cm     LV E/e' medial:  10.6 LV IVS:        1.00 cm     LV e' lateral:   10.30 cm/s LVOT diam:     2.00 cm     LV E/e' lateral: 6.5 LV SV:         27 LV SV Index:   19 LVOT Area:     3.14 cm  LV Volumes (MOD) LV vol d, MOD A2C: 66.6 ml LV vol d, MOD A4C: 68.9 ml LV vol s, MOD A2C: 42.0 ml LV vol s, MOD A4C: 40.4 ml LV SV MOD A2C:     24.6 ml LV SV MOD A4C:     68.9 ml LV SV MOD BP:      26.9 ml RIGHT VENTRICLE RV Basal diam:  4.15 cm RV Mid diam:    3.70 cm RV S prime:     9.41 cm/s TAPSE (M-mode): 1.7 cm LEFT ATRIUM           Index        RIGHT ATRIUM           Index LA diam:      3.60 cm 2.57 cm/m   RA Area:     32.70 cm LA Vol (A4C): 65.6 ml 46.75 ml/m  RA Volume:   133.00 ml 94.79 ml/m  AORTIC VALVE             PULMONIC VALVE LVOT Vmax:   41.40 cm/s  PR End Diast Vel: 2.41 msec LVOT Vmean:  25.700 cm/s LVOT VTI:     0.085 m AI PHT:      542 msec  AORTA Ao Root diam: 3.00 cm Ao Asc diam:  3.10 cm MITRAL VALVE                  TRICUSPID VALVE MV Area (PHT): 2.93 cm       TR Peak grad:   46.0 mmHg MV Decel Time: 259 msec       TR Vmax:        339.00 cm/s MR Peak grad:    80.3 mmHg MR Mean grad:    51.0 mmHg    SHUNTS MR Vmax:         448.00 cm/s  Systemic VTI:  0.09 m MR Vmean:        337.0 cm/s   Systemic Diam: 2.00 cm MR PISA:         0.57 cm MR PISA Eff ROA: 5 mm MR PISA Radius:  0.30 cm MV E velocity: 67.10 cm/s MV A velocity: 29.50 cm/s MV E/A ratio:  2.27 Halford Decamp signed by Carolan Clines Signature Date/Time: 11/29/2021/10:52:24 AM    Final (Updated)  Medications:     Scheduled Medications:  apixaban  2.5 mg Oral BID   atorvastatin  40 mg Oral Daily   furosemide  20 mg Intravenous BID   pantoprazole  40 mg Oral BID   sodium chloride flush  3 mL Intravenous Q12H    Infusions:  amiodarone 30 mg/hr (11/30/21 0221)    PRN Medications: acetaminophen **OR** acetaminophen, albuterol, hydrALAZINE, ondansetron **OR** ondansetron (ZOFRAN) IV, mouth rinse    Patient Profile   76 y/o female w/ history of systolic heart failure due to nonischemic/ tachymediated CM from rapid Afib w/ prior hospitalization c/b cardiogenic shock and ultimate improvement in LVEF after restoration/maintain of NSR. Also w/ severe COPD, blindness, malnutrition/frailty, HTN, presenting to ED w/ a/c CHF, recurrent Afib w/ RVR and AKI.   Assessment/Plan   1. Afib w/ RVR - first diagnosed 09/2020, cath nonobstructive CAD. Chemical conversion w/ amiodarone - PO amiodarone ultimately discontinued (h/o severe COPD) - recurrent afib detected 5/23 but was well rate controlled - presented w/ RVR up to 150s, symptomatic w/ weakness - reports full compliance w/ Eliquis. No misses doses in the last 30 days - Amiodarone gtt at 30/hr, transition to PO 200 mg BID. Chemically converted out of RVR, rate in 60's.  - she  does not tolerate Afib well (prior h/o tachy mediated CM and cardiogenic shock). Long term suppression w/ amiodarone not ideal w/ COPD. EP to see outpatient for possible ablation  - continue Eliquis 5 mg bid - keep K > 4.0 and Mg > 2.0    2. Acute on Chronic Systolic Heart Failure - NICM - Echo 6/22 EF 20% RV moderately down RA massively dilated. Tachymediated from rapid Afib. LHC w/ nonobstructive CAD - Echo 12/22 EF normalized w/ restoration and maintenance of NSR, 55-60%, RV normal - Echo 5/23 EF back down, 45-50% (recurrent Afib w/ CVR) - NYHA Class III + mild volume overload on admission, in setting of persistent Afib w/ now RVR - Volume stable on exam, down 3lbs.  - Stop IV lasix 20mg  BID with worsening kidney function, can restart PO tomorrow - limited echo yesterday: LVEF: 40-45%, RV function mildly reduced RV mod enlarged. L/R atria severely dilated. Mild MR, Mod TR - hold Entresto w/ AKI. IV hydralazine PRN for BP  - Continue Jardiance 10 mg daily    3. AKI - SCr 1.6-> 1.79 (b/l 1.1) - monitor w/ diuresis   4. Hypokalemia - K 3.8 - replace K  - Keep K > 4.0 and Mg >2.0    5. Hypertension  - moderately elevated - holding Entresto w/AKI - PRN IV hydralazine    6. LUL Nodular Opacity - noted on CXR  - former smoker - further imaging/ w/u per primary team    7. Tricuspid Regurgitation - severe on prior Echo and LHC - ? Candidate for percutaneous TV clip    8. Blindness    Cardiac meds at discharge:  Hold Jardiance until 8/26 Ok to restart lasix PRN 8/26 Hold Entresto until follow-up  Amiodarone 200 BID Eliquis 2.5 BID  Ok to d/c from AHF standpoint. Follow-up scheduled 9/1 in AHF clinic.   Length of Stay: [place], AGACNP-BC  11/30/2021, 7:37 AM  Advanced Heart Failure Team Pager [phone] (M-F; 7a - 5p)  Please contact [facility] for night-coverage after hours (5p -7a ) and weekends on [email or web address]  Patient seen and examined with the  above-signed Advanced Practice Provider and/or Housestaff. I personally reviewed laboratory data, imaging studies  and relevant notes. I independently examined the patient and formulated the important aspects of the plan. I have edited the note to reflect any of my changes or salient points. I have personally discussed the plan with the patient and/or family.  Remains in NSR on amio. Feels much better. Has diuresed well. Fluid down ReDS 22%. Up walking room without problem  However has mild AKI.   Adamant she wants to go home today.   General:  Sitting up in bed No resp difficulty HEENT: normal Neck: supple. no JVD. Carotids 2+ bilat; no bruits. No lymphadenopathy or thryomegaly appreciated. Cor: PMI nondisplaced. Regular rate & rhythm. No rubs, gallops or murmurs. Lungs: clear Abdomen: soft, nontender, nondistended. No hepatosplenomegaly. No bruits or masses. Good bowel sounds. Extremities: no cyanosis, clubbing, rash, edema Neuro: alert & orientedx3, cranial nerves grossly intact. moves all 4 extremities w/o difficulty. Affect pleasant  Sitting up in bed. Now in NSR. Volume low but stable. Mild AKI.   I think she can go home today on oral amio.   Hold Entresto for now. Will resume in Clinic. Restart Jardiance on Saturday. D/w Dr. Salina April, MD  2:33 PM

## 2021-11-30 NOTE — Assessment & Plan Note (Signed)
Nodule on her left upper lobe, recommendations for outpatient CT chest.

## 2021-11-30 NOTE — Progress Notes (Signed)
REDS VEST / Clip  READING=   22%  Device FITTING TASKS: Clip Station = B   Leota Sauers Pharm.D. CPP, BCPS Clinical Pharmacist [phone] 11/30/2021 12:49 PM

## 2021-11-30 NOTE — Assessment & Plan Note (Signed)
No clinical signs of exacerbation  

## 2021-11-30 NOTE — Assessment & Plan Note (Signed)
CKD stage 3a.  Patient had diuresis with IV furosemide, at the time of her discharge her renal function has a serum cr of 1,79 with K at 3.8 and serum bicarbonate at 23.   Plan to hold on entresoto and SGLT 2 inh for now, resume furosemide as needed on 08/26. Follow up renal function and electrolytes as outpatient.

## 2021-11-30 NOTE — Discharge Summary (Signed)
Physician Discharge Summary   Patient: Laurie James MRN: [id number] DOB: 12-29-1945  Admit date:     11/28/2021  Discharge date: 11/30/21  Discharge Physician: Coralie Keens   PCP: Georgann Housekeeper, MD   Recommendations at discharge:    Holding entresoto and SGLT 2 inh until outpatient follow up on renal function. Plan to resume with as needed furosemide on 12/03/21. Continue with oral load of amiodarone 200 mg bid, further dose adjustments as outpatient.  Anticoagulation with apixaban.  Follow up CT chest as outpatient for left upper lobe pulmonary nodule.   I spoke with patient's son at the bedside, we talked in detail about patient's condition, plan of care and prognosis and all questions were addressed.    Discharge Diagnoses: Principal Problem:   Acute on chronic systolic (congestive) heart failure (HCC) Active Problems:   Paroxysmal atrial fibrillation with RVR (HCC)   Acute kidney injury superimposed on chronic kidney disease (HCC)   COPD (chronic obstructive pulmonary disease) (HCC)   HLD (hyperlipidemia)   Abnormal chest x-ray   Macular degeneration   MCI (mild cognitive impairment)   DNR (do not resuscitate)  Resolved Problems:   * No resolved hospital problems. [facility] Course: Laurie James was admitted to the hospital with the working diagnosis of decompensated heart failure.   76 yo female with the past medical history of heart failure, atrial fibrillation, COPD and macular degeneration who presented with generalized weakness. Positive dyspnea on exertion, and lower extremity edema. Positive weight gain 4 to 5 lbs recently. On her initial physical examination her blood pressure was 123/78. HR 81, RR 21 and 02 saturation 96%, lungs with no wheezing or rhonchi, heart with S1 and S2 present and rhythmic, abdomen not distended and positive lower extremity edema.   Na 140, K 3,4 CL 107 bicarbonate 24, glucose 94 bun 20 cr 1,63  BNP 1,930 High  sensitive troponin 21 and 24  Wbc 6,5 hgb 10,1 plt 274   Chest radiograph with left upper lobe small nodule.  Small bilateral pleural effusions, more right than left.   EKG 130 bpm, normal axis, normal intervals, atrial fibrillation rhythm with PAC, no significant ST segment or T wave changes.   Patient was placed on diltiazem drip for rate control and received furosemide for diuresis.   Cardiology was consulted and patient was transitioned to amiodarone IV, converting back to sinus rhythm.   Patient will continue amiodarone as outpatient and will follow up with cardiology.    Assessment and Plan: * Acute on chronic systolic (congestive) heart failure (HCC) Echocardiogram with reduced LV systolic function with EF 40 to 45%, global hypokinesis, RV systolic function with mild reduction, moderate elevated pulmonary systolic pressure. Estimated RVSP 54, severe dilatation on bilateral atriums. Moderate TR.  Patient was placed on furosemide for diuresis, negative fluid balance was achieved, -714 ml, with improvement in her symptoms  At the time of her discharge patient will continue taking furosemide as needed for volume overload, starting on 08/26. Holding entresto and SGLT 2 inh until outpatient follow up due to acute kidney injury.   Paroxysmal atrial fibrillation with RVR (HCC) Patient has converted to sinus rhythm with improvement of her symptoms.  Plan to continue amiodarone load with oral regimen 200 mg bid and follow up as outpatient for further reduction in dose. Anticoagulation with apixaban, lowe dose 2,5 mg bid.   Acute kidney injury superimposed on chronic kidney disease (HCC) CKD stage 3a.  Patient had diuresis with IV  furosemide, at the time of her discharge her renal function has a serum cr of 1,79 with K at 3.8 and serum bicarbonate at 23.   Plan to hold on entresoto and SGLT 2 inh for now, resume furosemide as needed on 08/26. Follow up renal function and electrolytes  as outpatient.   COPD (chronic obstructive pulmonary disease) (HCC) No clinical signs of exacerbation   HLD (hyperlipidemia) Continue with statin therapy.   Abnormal chest x-ray Nodule on her left upper lobe, recommendations for outpatient CT chest.          Consultants: cardiology  Procedures performed: non  Disposition: Home Diet recommendation:  Cardiac diet DISCHARGE MEDICATION: Allergies as of 11/30/2021       Reactions   Codeine Nausea And Vomiting, Other (See Comments)   Upsets the stomach        Medication List     STOP taking these medications    empagliflozin 10 MG Tabs tablet Commonly known as: Jardiance   Entresto 24-26 MG Generic drug: sacubitril-valsartan       TAKE these medications    albuterol 108 (90 Base) MCG/ACT inhaler Commonly known as: VENTOLIN HFA Inhale 2 puffs into the lungs every 4 (four) hours as needed for wheezing.   amiodarone 200 MG tablet Commonly known as: PACERONE Take 1 tablet (200 mg total) by mouth 2 (two) times daily.   apixaban 2.5 MG Tabs tablet Commonly known as: ELIQUIS Take 1 tablet (2.5 mg total) by mouth 2 (two) times daily. What changed:  medication strength how much to take   atorvastatin 40 MG tablet Commonly known as: LIPITOR Take 1 tablet by mouth once daily   cyanocobalamin 1000 MCG/ML injection Commonly known as: VITAMIN B12 Inject 1,000 mcg into the muscle every 30 (thirty) days.   furosemide 20 MG tablet Commonly known as: LASIX Take 1 tablet (20 mg total) by mouth as needed. Leg edema What changed:  reasons to take this additional instructions   nitroGLYCERIN 0.4 MG SL tablet Commonly known as: NITROSTAT Place 1 tablet (0.4 mg total) under the tongue every 5 (five) minutes x 3 doses as needed for chest pain.   ondansetron 4 MG tablet Commonly known as: ZOFRAN Take 4 mg by mouth 3 (three) times daily as needed for nausea or vomiting.   pantoprazole 40 MG tablet Commonly known  as: PROTONIX Take 1 tablet (40 mg total) by mouth 2 (two) times daily.        Follow-up Information     [facility] Follow up.   Specialty: Cardiology Why: 12/09/21 at 3:30 PM   [facility], Entrance C (Dr. Bensimhon's office) Contact information: [address] [id number] [place] [address] [phone]               Discharge Exam: Filed Weights   11/28/21 1108 11/29/21 0500 11/30/21 0358  Weight: 49.4 kg 52.2 kg 50.8 kg   BP 121/70 (BP Location: Right Arm)   Pulse (!) 58   Temp 98.3 F (36.8 C) (Oral)   Resp 17   Ht 4\' 8"  (1.422 m)   Wt 50.8 kg   SpO2 100%   BMI 25.09 kg/m   Patient is feeling well with no chest pain or dyspnea, lower extremity edema has resolved.  Neurology awake and alert ENT with mild pallor Cardiovascular with S1 and S2 present and rhythmic with no gallops, rubs or murmurs No JVD  Respiratory with no rales or wheezing Abdomen with no distention  No lower extremity edama  Condition at discharge: stable  The results of significant diagnostics from this hospitalization (including imaging, microbiology, ancillary and laboratory) are listed below for reference.   Imaging Studies: ECHOCARDIOGRAM COMPLETE  Result Date: 11/29/2021    ECHOCARDIOGRAM REPORT   Patient Name:   [facility] Date of Exam: 11/29/2021 Medical Rec #:  [id number]                   Height:       56.0 in Accession #:    [id number]                  Weight:       115.0 lb Date of Birth:  1945/11/22                   BSA:          1.403 m Patient Age:    36 years                    BP:           116/69 mmHg Patient Gender: F                           HR:           66 bpm. Exam Location:  Inpatient Procedure: 2D Echo, Color Doppler and Cardiac Doppler Indications:     CHF  History:         Patient has prior history of Echocardiogram examinations, most                   recent 09/09/2021. COPD; Arrythmias:Atrial Fibrillation.  Sonographer:     Greer Pickerel Sonographer#2:   Bernadene Person RDCS Referring Phys:  [place] Diagnosing Phys: [place] IMPRESSIONS  1. Hypokinetic basal-mid anterior/anteroseptal wall. Left ventricular ejection fraction, by estimation, is 40 to 45%. The left ventricle has mildly decreased function. The left ventricle demonstrates global hypokinesis. Left ventricular diastolic parameters are consistent with Grade II diastolic dysfunction (pseudonormalization).  2. Right ventricular systolic function is mildly reduced. The right ventricular size is moderately enlarged. There is moderately elevated pulmonary artery systolic pressure. The estimated right ventricular systolic pressure is 0000000 mmHg.  3. Left atrial size was severely dilated.  4. Right atrial size was severely dilated.  5. The mitral valve is normal in structure. Mild mitral valve regurgitation.  6. Tricuspid valve regurgitation is moderate.  7. The aortic valve was not well visualized. Aortic valve regurgitation is trivial.  8. The inferior vena cava is dilated in size with >50% respiratory variability, suggesting right atrial pressure of 8 mmHg. Comparison(s): No significant change from prior study. FINDINGS  Left Ventricle: Hypokinetic basal-mid anterior/anteroseptal wall. Left ventricular ejection fraction, by estimation, is 40 to 45%. The left ventricle has mildly decreased function. The left ventricle demonstrates global hypokinesis. The left ventricular  internal cavity size was normal in size. There is no left ventricular hypertrophy. Left ventricular diastolic parameters are consistent with Grade II diastolic dysfunction (pseudonormalization). Right Ventricle: The right ventricular size is moderately enlarged. Right ventricular systolic function is mildly reduced. There is moderately elevated pulmonary artery systolic pressure. The tricuspid regurgitant velocity is  3.39 m/s, and with an assumed right atrial pressure of 8 mmHg, the estimated right ventricular systolic pressure is 0000000 mmHg. Left Atrium: Left atrial  size was severely dilated. Right Atrium: Right atrial size was severely dilated. Pericardium: Trivial pericardial effusion is present. Mitral Valve: The mitral valve is normal in structure. Mild mitral valve regurgitation. Tricuspid Valve: The tricuspid valve is normal in structure. Tricuspid valve regurgitation is moderate. Aortic Valve: The aortic valve was not well visualized. Aortic valve regurgitation is trivial. Aortic regurgitation PHT measures 542 msec. Pulmonic Valve: The pulmonic valve was normal in structure. Pulmonic valve regurgitation is not visualized. Aorta: The aortic root and ascending aorta are structurally normal, with no evidence of dilitation. Venous: The inferior vena cava is dilated in size with greater than 50% respiratory variability, suggesting right atrial pressure of 8 mmHg.  LEFT VENTRICLE PLAX 2D LVIDd:         4.50 cm     Diastology LVIDs:         4.00 cm     LV e' medial:    6.32 cm/s LV PW:         0.80 cm     LV E/e' medial:  10.6 LV IVS:        1.00 cm     LV e' lateral:   10.30 cm/s LVOT diam:     2.00 cm     LV E/e' lateral: 6.5 LV SV:         27 LV SV Index:   19 LVOT Area:     3.14 cm  LV Volumes (MOD) LV vol d, MOD A2C: 66.6 ml LV vol d, MOD A4C: 68.9 ml LV vol s, MOD A2C: 42.0 ml LV vol s, MOD A4C: 40.4 ml LV SV MOD A2C:     24.6 ml LV SV MOD A4C:     68.9 ml LV SV MOD BP:      26.9 ml RIGHT VENTRICLE RV Basal diam:  4.15 cm RV Mid diam:    3.70 cm RV S prime:     9.41 cm/s TAPSE (M-mode): 1.7 cm LEFT ATRIUM           Index        RIGHT ATRIUM           Index LA diam:      3.60 cm 2.57 cm/m   RA Area:     32.70 cm LA Vol (A4C): 65.6 ml 46.75 ml/m  RA Volume:   133.00 ml 94.79 ml/m  AORTIC VALVE             PULMONIC VALVE LVOT Vmax:   41.40 cm/s  PR End Diast Vel: 2.41 msec LVOT Vmean:  25.700 cm/s LVOT VTI:    0.085 m AI  PHT:      542 msec  AORTA Ao Root diam: 3.00 cm Ao Asc diam:  3.10 cm MITRAL VALVE                  TRICUSPID VALVE MV Area (PHT): 2.93 cm       TR Peak grad:   46.0 mmHg MV Decel Time: 259 msec       TR Vmax:        339.00 cm/s MR Peak grad:    80.3 mmHg MR Mean grad:    51.0 mmHg    SHUNTS MR Vmax:         448.00 cm/s  Systemic VTI:  0.09 m MR Vmean:        337.0 cm/s   Systemic Diam: 2.00 cm MR PISA:         0.57 cm  MR PISA Eff ROA: 5 mm MR PISA Radius:  0.30 cm MV E velocity: 67.10 cm/s MV A velocity: 29.50 cm/s MV E/A ratio:  2.27 Phineas Inches Electronically signed by Phineas Inches Signature Date/Time: 11/29/2021/10:52:24 AM    Final (Updated)    DG Chest 2 View  Result Date: 11/28/2021 CLINICAL DATA:  Palpitations EXAM: CHEST - 2 VIEW COMPARISON:  Chest x-ray dated March 21, 2021 FINDINGS: Cardiac and mediastinal contours are unchanged. Stable biapical pleural-parenchymal scarring. Small right pleural effusion, unchanged when compared with the prior. Mild bibasilar linear opacities, likely due to scarring or atelectasis. Background emphysema. Vague nodular opacity of the left upper lobe which is new when compared with prior exam. No evidence of pneumothorax. IMPRESSION: 1. Indeterminate vague nodular opacity of the left upper lobe which is new when compared with prior exam. Recommend chest CT to evaluate for pulmonary nodule. 2. Unchanged small right pleural effusion. Electronically Signed   By: Yetta Glassman M.D.   On: 11/28/2021 12:06    Microbiology: Results for orders placed or performed during the hospital encounter of 05/20/21  Urine Culture     Status: None   Collection Time: 05/20/21  1:12 PM   Specimen: Urine, Clean Catch  Result Value Ref Range Status   Specimen Description URINE, CLEAN CATCH  Final   Special Requests NONE  Final   Culture   Final    NO GROWTH Performed at [facility], [place] [address] [place], [place] [address]    Report Status 05/21/2021 FINAL  Final     Labs: CBC: Recent Labs  Lab 11/28/21 1130 11/29/21 0338  WBC 6.5 6.4  HGB 10.1* 9.2*  HCT 31.9* 27.5*  MCV 101.6* 97.5  PLT 274 [id number]   Basic Metabolic Panel: Recent Labs  Lab 11/28/21 1130 11/29/21 0338 11/30/21 0431  NA 140 139 136  K 3.4* 3.9 3.8  CL 107 106 103  CO2 24 22 23   GLUCOSE 94 101* 91  BUN 20 24* 27*  CREATININE 1.63* 1.58* 1.79*  CALCIUM 9.0 8.6* 8.8*  MG 2.1  --   --    Liver Function Tests: No results for input(s): "AST", "ALT", "ALKPHOS", "BILITOT", "PROT", "ALBUMIN" in the last 168 hours. CBG: No results for input(s): "GLUCAP" in the last 168 hours.  Discharge time spent: greater than 30 minutes.  Signed: Tawni Millers, MD [facility] 11/30/2021

## 2021-11-30 NOTE — Progress Notes (Signed)
Pt safely discharged. Discharge packet provided with teach-back method. VS wnL and as per flow. IVs removed, Pt verbalized understanding. All questions and concerns addressed. Son present for transport. TOC medications picked up and education provided.

## 2021-11-30 NOTE — Hospital Course (Signed)
Laurie James was admitted to the hospital with the working diagnosis of decompensated heart failure.   76 yo female with the past medical history of heart failure, atrial fibrillation, COPD and macular degeneration who presented with generalized weakness. Positive dyspnea on exertion, and lower extremity edema. Positive weight gain 4 to 5 lbs recently. On her initial physical examination her blood pressure was 123/78. HR 81, RR 21 and 02 saturation 96%, lungs with no wheezing or rhonchi, heart with S1 and S2 present and rhythmic, abdomen not distended and positive lower extremity edema.   Na 140, K 3,4 CL 107 bicarbonate 24, glucose 94 bun 20 cr 1,63  BNP 1,930 High sensitive troponin 21 and 24  Wbc 6,5 hgb 10,1 plt 274   Chest radiograph with left upper lobe small nodule.  Small bilateral pleural effusions, more right than left.   EKG 130 bpm, normal axis, normal intervals, atrial fibrillation rhythm with PAC, no significant ST segment or T wave changes.   Patient was placed on diltiazem drip for rate control and received furosemide for diuresis.   Cardiology was consulted and patient was transitioned to amiodarone IV, converting back to sinus rhythm.   Patient will continue amiodarone as outpatient and will follow up with cardiology.

## 2021-11-30 NOTE — Assessment & Plan Note (Signed)
Echocardiogram with reduced LV systolic function with EF 40 to 45%, global hypokinesis, RV systolic function with mild reduction, moderate elevated pulmonary systolic pressure. Estimated RVSP 54, severe dilatation on bilateral atriums. Moderate TR.  Patient was placed on furosemide for diuresis, negative fluid balance was achieved, -714 ml, with improvement in her symptoms  At the time of her discharge patient will continue taking furosemide as needed for volume overload, starting on 08/26. Holding entresto and SGLT 2 inh until outpatient follow up due to acute kidney injury.

## 2021-11-30 NOTE — Assessment & Plan Note (Signed)
Continue with statin therapy.  ?

## 2021-11-30 NOTE — Assessment & Plan Note (Signed)
Patient has converted to sinus rhythm with improvement of her symptoms.  Plan to continue amiodarone load with oral regimen 200 mg bid and follow up as outpatient for further reduction in dose. Anticoagulation with apixaban, lowe dose 2,5 mg bid.

## 2021-12-09 ENCOUNTER — Encounter (HOSPITAL_COMMUNITY): Payer: 59

## 2021-12-26 ENCOUNTER — Other Ambulatory Visit (HOSPITAL_COMMUNITY): Payer: Self-pay

## 2021-12-26 MED ORDER — AMIODARONE HCL 200 MG PO TABS: 200.0000 mg | ORAL_TABLET | Freq: Two times a day (BID) | ORAL | 11 refills | Status: DC

## 2021-12-26 NOTE — Telephone Encounter (Signed)
Meds ordered this encounter  Medications   amiodarone (PACERONE) 200 MG tablet    Sig: Take 1 tablet (200 mg total) by mouth 2 (two) times daily.    Dispense:  60 tablet    Refill:  11

## 2021-12-30 ENCOUNTER — Ambulatory Visit (HOSPITAL_COMMUNITY)
Admission: RE | Admit: 2021-12-30 | Discharge: 2021-12-30 | Disposition: A | Discharge status: Home / Self Care | Payer: 59 | Source: Ambulatory Visit | Attending: Family Medicine | Admitting: Family Medicine

## 2021-12-30 ENCOUNTER — Encounter (HOSPITAL_COMMUNITY): Payer: Self-pay

## 2021-12-30 VITALS — BP 138/80 | HR 66 | Wt 112.4 lb

## 2021-12-30 DIAGNOSIS — E46 Unspecified protein-calorie malnutrition: Secondary | ICD-10-CM | POA: Diagnosis not present

## 2021-12-30 DIAGNOSIS — I110 Hypertensive heart disease with heart failure: Secondary | ICD-10-CM | POA: Diagnosis not present

## 2021-12-30 DIAGNOSIS — I4891 Unspecified atrial fibrillation: Secondary | ICD-10-CM | POA: Diagnosis not present

## 2021-12-30 DIAGNOSIS — J449 Chronic obstructive pulmonary disease, unspecified: Secondary | ICD-10-CM | POA: Insufficient documentation

## 2021-12-30 DIAGNOSIS — Z7901 Long term (current) use of anticoagulants: Secondary | ICD-10-CM | POA: Insufficient documentation

## 2021-12-30 DIAGNOSIS — Z87891 Personal history of nicotine dependence: Secondary | ICD-10-CM | POA: Insufficient documentation

## 2021-12-30 DIAGNOSIS — I2510 Atherosclerotic heart disease of native coronary artery without angina pectoris: Secondary | ICD-10-CM | POA: Diagnosis not present

## 2021-12-30 DIAGNOSIS — I428 Other cardiomyopathies: Secondary | ICD-10-CM | POA: Insufficient documentation

## 2021-12-30 DIAGNOSIS — R911 Solitary pulmonary nodule: Secondary | ICD-10-CM

## 2021-12-30 DIAGNOSIS — N179 Acute kidney failure, unspecified: Secondary | ICD-10-CM | POA: Diagnosis not present

## 2021-12-30 DIAGNOSIS — R54 Age-related physical debility: Secondary | ICD-10-CM | POA: Insufficient documentation

## 2021-12-30 DIAGNOSIS — I5032 Chronic diastolic (congestive) heart failure: Secondary | ICD-10-CM | POA: Diagnosis not present

## 2021-12-30 DIAGNOSIS — I5022 Chronic systolic (congestive) heart failure: Secondary | ICD-10-CM

## 2021-12-30 DIAGNOSIS — I10 Essential (primary) hypertension: Secondary | ICD-10-CM

## 2021-12-30 DIAGNOSIS — I4819 Other persistent atrial fibrillation: Secondary | ICD-10-CM

## 2021-12-30 DIAGNOSIS — Z79899 Other long term (current) drug therapy: Secondary | ICD-10-CM | POA: Insufficient documentation

## 2021-12-30 DIAGNOSIS — I071 Rheumatic tricuspid insufficiency: Secondary | ICD-10-CM

## 2021-12-30 LAB — BASIC METABOLIC PANEL
Anion gap: 9 (ref 5–15)
BUN: 17 mg/dL (ref 8–23)
CO2: 24 mmol/L (ref 22–32)
Calcium: 8.7 mg/dL — ABNORMAL LOW (ref 8.9–10.3)
Chloride: 106 mmol/L (ref 98–111)
Creatinine, Ser: 1.25 mg/dL — ABNORMAL HIGH (ref 0.44–1.00)
GFR, Estimated: 45 mL/min — ABNORMAL LOW (ref >60)
Glucose, Bld: 96 mg/dL (ref 70–99)
Potassium: 3.2 mmol/L — ABNORMAL LOW (ref 3.5–5.1)
Sodium: 139 mmol/L (ref 135–145)

## 2021-12-30 LAB — BRAIN NATRIURETIC PEPTIDE: B Natriuretic Peptide: 816.1 pg/mL — ABNORMAL HIGH (ref 0.0–100.0)

## 2021-12-30 LAB — MAGNESIUM: Magnesium: 2.1 mg/dL (ref 1.7–2.4)

## 2021-12-30 MED ORDER — FUROSEMIDE 20 MG PO TABS: 20.0000 mg | ORAL_TABLET | ORAL | 6 refills | Status: DC | PRN

## 2021-12-30 MED ORDER — ENTRESTO 24-26 MG PO TABS: 1.0000 | ORAL_TABLET | Freq: Two times a day (BID) | ORAL | 6 refills | Status: DC

## 2021-12-30 NOTE — Patient Instructions (Addendum)
Thank you for coming in today  Labs were done today, if any labs are abnormal the clinic will call you No news is good news  RESTART Entresto 24/26 1 tablet twice daily   You have been referred to EP office they will call you for further appointment details  Your lasix has been refilled  Your physician recommends that you schedule a follow-up appointment in:  3-4 weeks in clinic 12 weeks with Dr. Haroldine James  At the [facility], you and your health needs are our priority. As part of our continuing mission to provide you with exceptional heart care, we have created designated Provider Care Teams. These Care Teams include your primary Cardiologist (physician) and Advanced Practice Providers (APPs- Physician Assistants and Nurse Practitioners) who all work together to provide you with the care you need, when you need it.     Do the following things EVERYDAY: Weigh yourself in the morning before breakfast. Write it down and keep it in a log. Take your medicines as prescribed Eat low salt foods--Limit salt (sodium) to 2000 mg per day.  Stay as active as you can everyday Limit all fluids for the day to less than 2 liters   You may see any of the following providers on your designated Care Team at your next follow up: Dr Glori Bickers Dr Loralie Champagne Dr. Roxana Hires, NP Lyda Jester, [place] [facility] [place], [place] Forestine Na, NP Audry Riles, PharmD   Please be sure to bring in all your medications bottles to every appointment.   If you have any questions or concerns before your next appointment please send [place] a message through [place] or call our office at [phone].    TO LEAVE A MESSAGE FOR THE NURSE SELECT OPTION 2, PLEASE LEAVE A MESSAGE INCLUDING: YOUR NAME DATE OF BIRTH CALL BACK NUMBER REASON FOR CALL**this is important as we prioritize the call backs  YOU WILL RECEIVE A CALL BACK THE SAME DAY AS LONG AS YOU CALL  BEFORE 4:00 PM

## 2021-12-30 NOTE — Progress Notes (Addendum)
Advanced Heart Failure Clinic Note    PCP: Wenda Low, MD PCP-Cardiologist: Jenkins Rouge, MD  HF Cardiologist: Dr. Haroldine Laws  HPI: Laurie James is a 76 y.o. woman with severe COPD, blindness, malnutrition/frailty, HTN, systolic HF due to NICM (diagnosed 7/22) and atrial fibrillation.   Admitted 6/22 with new onset AF. EF normal and RV dilated. Cath with nonobstructive CAD. Sent home with [facility] and rate control w/ plans for outpatient DCCV after 3 weeks of a/c.   Readmitted 7/22 with acute HF -> cardiogenic shock. TEE EF 20% RV moderately down RA massively dilated. Heavy smoke in RA/LA and aortic root. DCCV canceled. Initial Co-ox 38% and required milrinone. Had right sided thoracentesis on 07/13 and subsequent CXRs showed some reaccumulation. Diuresed with IV lasix. Po lasix PRN at discharge.  D/c weight 99 lbs.  Echo 03/21/21: EF 55-60% RV dilated with normal function. Severe TR. Personally reviewed  Admitted 8/23 with afib RVR in the 150's. Given IV Cardizem 10 mg x 1 for rate control + IV Lasix. Eventually started amiodarone gtt and chemically converted out of RVR. Echo 8/23  showed EF: 40-45%, RV function mildly reduced RV mod enlarged. L/R atria severely dilated. Mild MR, Mod TR. Drips weaned, and oral amio started. Not ideal given COPD and EP outpatient follow up arranged. GDMT limited by mild AKI and Entresto held. Discharged home, weight 112 lbs.  Today she returns for post hospital HF follow up with her daughter. Overall feeling fine. No SOB with ADLs and getting around the house. Does not walk up stairs. Denies palpitations, abnormal bleeding, CP, dizziness, edema, or PND/Orthopnea. Appetite ok. No fever or chills. Weight at home 110-112 pounds. Remains off Jardiance and Entresto since d/c. Uses lasix 2x/week.  Cardiac Studies:  - Echo (8/23): EF 40-45%, grade II DD, RV mildly reduced, moderate TR  - Echo (6/23): EF 45-50%, RV ok, severe TR  - Echo (10/06/20): EF 55-60 %    - LHC (10/07/2020) showed mild 40% segmental RCA stenosis as well as 50 to 60% second diagonal branch stenosis  RHC 6/22 RA 3 PA 17/2 (8) PCWP 7 PVR  0.25 WU Fick 2.6/2.0 (LVEF was low)  - TEE (7/22): EF 20% RV moderately down RA massively dilated. Heavy smoke in RA/LA and aortic root.   Past Medical History:  Diagnosis Date   AMD (age related macular degeneration)    COPD (chronic obstructive pulmonary disease) ([facility])    Depression    Hiatal hernia    Intermittent low back pain    Memory impairment    Midsternal chest pain    a. 12/2011 Cardiac CTA Ca++ score of 103.3 (80th %), LAD <50p/m, RCA 50-75.   Osteoporosis    PAF (paroxysmal atrial fibrillation) ([place]) 09/2020   Vitamin B 12 deficiency    Current Outpatient Medications  Medication Sig Dispense Refill   albuterol (VENTOLIN HFA) 108 (90 Base) MCG/ACT inhaler Inhale 2 puffs into the lungs every 4 (four) hours as needed for wheezing.     amiodarone (PACERONE) 200 MG tablet Take 1 tablet (200 mg total) by mouth 2 (two) times daily. 60 tablet 11   apixaban (ELIQUIS) 2.5 MG TABS tablet Take 1 tablet (2.5 mg total) by mouth 2 (two) times daily. 60 tablet 0   atorvastatin (LIPITOR) 40 MG tablet Take 1 tablet by mouth once daily 90 tablet 3   cyanocobalamin (,VITAMIN B-12,) 1000 MCG/ML injection Inject 1,000 mcg into the muscle every 30 (thirty) days.  3   furosemide (LASIX) 20  MG tablet Take 1 tablet (20 mg total) by mouth as needed. Leg edema 30 tablet 6   nitroGLYCERIN (NITROSTAT) 0.4 MG SL tablet Place 1 tablet (0.4 mg total) under the tongue every 5 (five) minutes x 3 doses as needed for chest pain. 25 tablet 0   ondansetron (ZOFRAN) 4 MG tablet Take 4 mg by mouth 3 (three) times daily as needed for nausea or vomiting.     pantoprazole (PROTONIX) 40 MG tablet Take 1 tablet (40 mg total) by mouth 2 (two) times daily. 180 tablet 3   No current facility-administered medications for this encounter.   Allergies  Allergen  Reactions   Codeine Nausea And Vomiting and Other (See Comments)    Upsets the stomach   Social History   Socioeconomic History   Marital status: Married    Spouse name: Not on file   Number of children: Not on file   Years of education: Not on file   Highest education level: Not on file  Occupational History   Occupation: retired  Tobacco Use   Smoking status: Former    Packs/day: 1.00    Years: 65.00    Total pack years: 65.00    Types: Cigarettes    Quit date: 09/2020    Years since quitting: 1.3   Smokeless tobacco: Never  Vaping Use   Vaping Use: Never used  Substance and Sexual Activity   Alcohol use: No    Alcohol/week: 0.0 standard drinks of alcohol    Comment: rarely   Drug use: No   Sexual activity: Not on file  Other Topics Concern   Not on file  Social History Narrative   Not on file   Social Determinants of Health   Financial Resource Strain: Not on file  Food Insecurity: Not on file  Transportation Needs: No Transportation Needs (10/28/2020)   PRAPARE - Hydrologist (Medical): No    Lack of Transportation (Non-Medical): No  Physical Activity: Not on file  Stress: Not on file  Social Connections: Not on file  Intimate Partner Violence: Not on file   Family History  Problem Relation Age of Onset   Breast cancer Sister    Breast cancer Sister    BP 138/80   Pulse 66   Wt 51 kg (112 lb 6.4 oz)   SpO2 98%   BMI 25.20 kg/m   Wt Readings from Last 3 Encounters:  12/30/21 51 kg (112 lb 6.4 oz)  11/30/21 50.8 kg (111 lb 14.4 oz)  08/09/21 49.4 kg (109 lb)   PHYSICAL EXAM: General:  NAD. No resp difficulty, arrived in [facility] HEENT: Blind Neck: Supple. No JVD. Carotids 2+ bilat; no bruits. No lymphadenopathy or thryomegaly appreciated. Cor: PMI nondisplaced. Regular rate & rhythm. No rubs, gallops, 2/6 TR Lungs: Clear Abdomen: Soft, nontender, nondistended. No hepatosplenomegaly. No bruits or masses. Good bowel  sounds. Extremities: No cyanosis, clubbing, rash, edema Neuro: Alert & oriented x 3, cranial nerves grossly intact. Moves all 4 extremities w/o difficulty. Affect pleasant.  ECG (personally reviewed): NSR with PVCs, 64 bpm  ASSESSMENT & PLAN: 1. Afib  - first diagnosed 09/2020, cath nonobstructive CAD. Chemical conversion w/ amiodarone - PO amiodarone ultimately discontinued (h/o severe COPD) - Recurrent afib detected 5/23 but was well rate controlled - Recent admit with AF  w/ RVR up to 150s, symptomatic w/ weakness. Converted on amio gtt.  - she does not tolerate Afib well (prior h/o tachy mediated CM and cardiogenic shock).  Long term suppression w/ amiodarone not ideal w/ COPD.  - Continue amiodarone. - Continue Eliquis 5 mg bid. - Refer to EP to discussion ablation candidacy. - Check BMET and Mag   2. Chronic Systolic Heart Failure - NICM - Echo (6/22): EF 20% RV moderately down RA massively dilated. Tachymediated from rapid Afib. LHC w/ nonobstructive CAD - Echo (12/22): EF normalized w/ restoration and maintenance of NSR, 55-60%, RV normal - Echo (5/23): EF back down, 45-50% (recurrent Afib w/ CVR) - Ltd echo (8/23): EF 40-45%, RV mildly reduced, L/R atria severely dilated. Mild MR, Mod TR - Improved NYHA II. Volume OK today. - Restart Entresto 24/26 mg bid. - Continue Lasix 20 mg PRN. Refill today. - Add Jardiance next. - Labs today. BMET in 7-10 days.   3. H/o AKI - Baseline SCr 1.1 - Labs today.   4. H/o Hypokalemia - Labs today.   5. Hypertension  - Mildly elevated today. - Restart Entresto as above.    6. LUL Nodular Opacity - Noted on CXR  - Former smoker - Further work up per PCP   7. Tricuspid Regurgitation - Severe on prior Echo and LHC - ? Candidate for percutaneous TV clip .   Follow up in 3-4 weeks with APP and 12 weeks with Dr. Haroldine Laws.  Allena Katz, FNP-BC 12/30/21

## 2022-01-02 ENCOUNTER — Telehealth (HOSPITAL_COMMUNITY): Payer: Self-pay

## 2022-01-02 DIAGNOSIS — I5022 Chronic systolic (congestive) heart failure: Secondary | ICD-10-CM

## 2022-01-02 MED ORDER — POTASSIUM CHLORIDE CRYS ER 20 MEQ PO TBCR
20.0000 meq | EXTENDED_RELEASE_TABLET | Freq: Every day | ORAL | 0 refills | Status: DC
Start: 2022-01-02 — End: 2022-01-12

## 2022-01-02 NOTE — Telephone Encounter (Addendum)
-----   Message from Rafael Bihari, [place] sent at 12/30/2021  5:05 PM EDT ----- K is low. Please make sure she takes 20 KCL when she takes her PRN lasix (she will need a Rx for the KCL).  Please take 40 kcl x 1 today then start 20 KCL daily, thereafter. Please repeat BMET in 7-10 days   Spoke with patient. Pt aware, agreeable, and verbalized understanding. Rx sent to pharmacy. Pt to take 2 tab (43mEq) KCl today, then 1 tab (32mEq) daily starting 9/26. Lab draw scheduled for 10/4 @ 3:15pm, pt confirmed.

## 2022-01-04 DIAGNOSIS — M810 Age-related osteoporosis without current pathological fracture: Secondary | ICD-10-CM | POA: Diagnosis not present

## 2022-01-04 DIAGNOSIS — I4891 Unspecified atrial fibrillation: Secondary | ICD-10-CM | POA: Diagnosis not present

## 2022-01-04 DIAGNOSIS — I5022 Chronic systolic (congestive) heart failure: Secondary | ICD-10-CM | POA: Diagnosis not present

## 2022-01-04 DIAGNOSIS — Z23 Encounter for immunization: Secondary | ICD-10-CM | POA: Diagnosis not present

## 2022-01-04 DIAGNOSIS — D6869 Other thrombophilia: Secondary | ICD-10-CM | POA: Diagnosis not present

## 2022-01-04 DIAGNOSIS — E538 Deficiency of other specified B group vitamins: Secondary | ICD-10-CM | POA: Diagnosis not present

## 2022-01-04 DIAGNOSIS — I700 Atherosclerosis of aorta: Secondary | ICD-10-CM | POA: Diagnosis not present

## 2022-01-04 DIAGNOSIS — R911 Solitary pulmonary nodule: Secondary | ICD-10-CM | POA: Diagnosis not present

## 2022-01-04 DIAGNOSIS — J449 Chronic obstructive pulmonary disease, unspecified: Secondary | ICD-10-CM | POA: Diagnosis not present

## 2022-01-04 DIAGNOSIS — I2510 Atherosclerotic heart disease of native coronary artery without angina pectoris: Secondary | ICD-10-CM | POA: Diagnosis not present

## 2022-01-04 DIAGNOSIS — I270 Primary pulmonary hypertension: Secondary | ICD-10-CM | POA: Diagnosis not present

## 2022-01-04 NOTE — Addendum Note (Signed)
Encounter addended by: Scarlette Calico, RN on: 01/04/2022 5:10 PM  Actions taken: Order list changed, Diagnosis association updated

## 2022-01-11 ENCOUNTER — Ambulatory Visit (HOSPITAL_COMMUNITY)
Admission: RE | Admit: 2022-01-11 | Discharge: 2022-01-11 | Disposition: A | Discharge status: Home / Self Care | Payer: 59 | Source: Ambulatory Visit | Attending: Internal Medicine | Admitting: Internal Medicine

## 2022-01-11 DIAGNOSIS — I5022 Chronic systolic (congestive) heart failure: Secondary | ICD-10-CM | POA: Diagnosis present

## 2022-01-11 LAB — BASIC METABOLIC PANEL
Anion gap: 8 (ref 5–15)
BUN: 37 mg/dL — ABNORMAL HIGH (ref 8–23)
CO2: 21 mmol/L — ABNORMAL LOW (ref 22–32)
Calcium: 9 mg/dL (ref 8.9–10.3)
Chloride: 110 mmol/L (ref 98–111)
Creatinine, Ser: 2.11 mg/dL — ABNORMAL HIGH (ref 0.44–1.00)
GFR, Estimated: 24 mL/min — ABNORMAL LOW (ref >60)
Glucose, Bld: 97 mg/dL (ref 70–99)
Potassium: 5.6 mmol/L — ABNORMAL HIGH (ref 3.5–5.1)
Sodium: 139 mmol/L (ref 135–145)

## 2022-01-12 ENCOUNTER — Encounter (HOSPITAL_COMMUNITY): Payer: Self-pay

## 2022-01-12 ENCOUNTER — Ambulatory Visit (HOSPITAL_COMMUNITY)
Admission: RE | Admit: 2022-01-12 | Discharge: 2022-01-12 | Disposition: A | Discharge status: Home / Self Care | Payer: 59 | Source: Ambulatory Visit | Attending: Family Medicine | Admitting: Family Medicine

## 2022-01-12 ENCOUNTER — Telehealth (HOSPITAL_COMMUNITY): Payer: Self-pay | Admitting: Surgery

## 2022-01-12 VITALS — BP 122/78 | HR 89 | Wt 110.4 lb

## 2022-01-12 DIAGNOSIS — N179 Acute kidney failure, unspecified: Secondary | ICD-10-CM | POA: Diagnosis not present

## 2022-01-12 DIAGNOSIS — E46 Unspecified protein-calorie malnutrition: Secondary | ICD-10-CM | POA: Insufficient documentation

## 2022-01-12 DIAGNOSIS — R911 Solitary pulmonary nodule: Secondary | ICD-10-CM | POA: Diagnosis not present

## 2022-01-12 DIAGNOSIS — Z7901 Long term (current) use of anticoagulants: Secondary | ICD-10-CM | POA: Diagnosis not present

## 2022-01-12 DIAGNOSIS — I428 Other cardiomyopathies: Secondary | ICD-10-CM | POA: Insufficient documentation

## 2022-01-12 DIAGNOSIS — N1832 Chronic kidney disease, stage 3b: Secondary | ICD-10-CM | POA: Diagnosis not present

## 2022-01-12 DIAGNOSIS — E875 Hyperkalemia: Secondary | ICD-10-CM | POA: Diagnosis not present

## 2022-01-12 DIAGNOSIS — I480 Paroxysmal atrial fibrillation: Secondary | ICD-10-CM | POA: Diagnosis not present

## 2022-01-12 DIAGNOSIS — I081 Rheumatic disorders of both mitral and tricuspid valves: Secondary | ICD-10-CM | POA: Insufficient documentation

## 2022-01-12 DIAGNOSIS — Z87891 Personal history of nicotine dependence: Secondary | ICD-10-CM | POA: Insufficient documentation

## 2022-01-12 DIAGNOSIS — I5022 Chronic systolic (congestive) heart failure: Secondary | ICD-10-CM

## 2022-01-12 DIAGNOSIS — R54 Age-related physical debility: Secondary | ICD-10-CM | POA: Insufficient documentation

## 2022-01-12 DIAGNOSIS — I2510 Atherosclerotic heart disease of native coronary artery without angina pectoris: Secondary | ICD-10-CM | POA: Insufficient documentation

## 2022-01-12 DIAGNOSIS — J449 Chronic obstructive pulmonary disease, unspecified: Secondary | ICD-10-CM | POA: Insufficient documentation

## 2022-01-12 DIAGNOSIS — I130 Hypertensive heart and chronic kidney disease with heart failure and stage 1 through stage 4 chronic kidney disease, or unspecified chronic kidney disease: Secondary | ICD-10-CM | POA: Insufficient documentation

## 2022-01-12 LAB — BASIC METABOLIC PANEL
Anion gap: 9 (ref 5–15)
BUN: 34 mg/dL — ABNORMAL HIGH (ref 8–23)
CO2: 21 mmol/L — ABNORMAL LOW (ref 22–32)
Calcium: 9.1 mg/dL (ref 8.9–10.3)
Chloride: 104 mmol/L (ref 98–111)
Creatinine, Ser: 1.56 mg/dL — ABNORMAL HIGH (ref 0.44–1.00)
GFR, Estimated: 34 mL/min — ABNORMAL LOW (ref >60)
Glucose, Bld: 93 mg/dL (ref 70–99)
Potassium: 5.1 mmol/L (ref 3.5–5.1)
Sodium: 134 mmol/L — ABNORMAL LOW (ref 135–145)

## 2022-01-12 NOTE — Progress Notes (Addendum)
Advanced Heart Failure Clinic Note    PCP: Laurie Low, MD PCP-Cardiologist: Laurie Rouge, MD  HF Cardiologist: Dr. Haroldine James  HPI: Laurie James is a 76 y.o. woman with severe COPD, blindness, malnutrition/frailty, HTN, systolic HF due to NICM (diagnosed 7/22) and atrial fibrillation.   Admitted 6/22 with new onset AF. EF normal and RV dilated. Cath with nonobstructive CAD. Sent home with [facility] and rate control w/ plans for outpatient DCCV after 3 weeks of a/c.   Readmitted 7/22 with acute HF -> cardiogenic shock. TEE EF 20% RV moderately down RA massively dilated. Heavy smoke in RA/LA and aortic root. DCCV canceled. Initial Co-ox 38% and required milrinone. Had right sided thoracentesis on 07/13 and subsequent CXRs showed some reaccumulation. Diuresed with IV lasix. Po lasix PRN at discharge.  D/c weight 99 lbs.  Echo 03/21/21: EF 55-60% RV dilated with normal function. Severe TR. Personally reviewed  Admitted 8/23 with afib RVR in the 150's. Given IV Cardizem 10 mg x 1 for rate control + IV Lasix. Eventually started amiodarone gtt and chemically converted out of RVR. Echo 8/23  showed EF: 40-45%, RV function mildly reduced RV mod enlarged. L/R atria severely dilated. Mild MR, Mod TR. Drips weaned, and oral amio started. Not ideal given COPD and EP outpatient follow up arranged. GDMT limited by mild AKI and Entresto held. Discharged home, weight 112 lbs.  She was seen back for f/u 9/22 and was doing well. Wt was stable. EKG showed NSR. AKI had resolved. Scr back to b/l, Scr down to 1.25. Laurie James was restarted. Instructed to f/u in 2-3 wks for reassessment and further titration of meds.   Of note, she had f/u BMP done yesterday, 10/4. This showed AKI and hyperkalemia. Scr increased from 1.25>>2.11. K elevated at 5.6. Unfortunately, he has already taken her AM dose of Entresto and KCl this morning.   She returns today for f/u. Here w/ daughter. Reports some fatigue and lightheadedness. Dry  on exam. Eating regularly but not drinking much fluids. BP 122/78. Breathing is ok. Stable NYHA Class II. Denies CP. EKG shows 2nd degree AVB, type 1. QTc 449 ms.  She has EP appt scheduled 10/4 to discuss possible afib ablation.     Cardiac Studies:  - Echo (8/23): EF 40-45%, grade II DD, RV mildly reduced, moderate TR  - Echo (6/23): EF 45-50%, RV ok, severe TR  - Echo (10/06/20): EF 55-60 %   - LHC (10/07/2020) showed mild 40% segmental RCA stenosis as well as 50 to 60% second diagonal branch stenosis  RHC 6/22 RA 3 PA 17/2 (8) PCWP 7 PVR  0.25 WU Fick 2.6/2.0 (LVEF was James)  - TEE (7/22): EF 20% RV moderately down RA massively dilated. Heavy smoke in RA/LA and aortic root.   Past Medical History:  Diagnosis Date   AMD (age related macular degeneration)    COPD (chronic obstructive pulmonary disease) ([place])    Depression    Hiatal hernia    Intermittent James back pain    Memory impairment    Midsternal chest pain    a. 12/2011 Cardiac CTA Ca++ score of 103.3 (80th %), LAD <50p/m, RCA 50-75.   Osteoporosis    PAF (paroxysmal atrial fibrillation) ([place]) 09/2020   Vitamin B 12 deficiency    Current Outpatient Medications  Medication Sig Dispense Refill   albuterol (VENTOLIN HFA) 108 (90 Base) MCG/ACT inhaler Inhale 2 puffs into the lungs every 4 (four) hours as needed for wheezing.     amiodarone (  PACERONE) 200 MG tablet Take 1 tablet (200 mg total) by mouth 2 (two) times daily. 60 tablet 11   apixaban (ELIQUIS) 2.5 MG TABS tablet Take 1 tablet (2.5 mg total) by mouth 2 (two) times daily. 60 tablet 0   atorvastatin (LIPITOR) 40 MG tablet Take 1 tablet by mouth once daily 90 tablet 3   cyanocobalamin (,VITAMIN B-12,) 1000 MCG/ML injection Inject 1,000 mcg into the muscle every 30 (thirty) days.  3   furosemide (LASIX) 20 MG tablet Take 1 tablet (20 mg total) by mouth as needed. Leg edema 30 tablet 6   nitroGLYCERIN (NITROSTAT) 0.4 MG SL tablet Place 1 tablet (0.4 mg total) under  the tongue every 5 (five) minutes x 3 doses as needed for chest pain. 25 tablet 0   ondansetron (ZOFRAN) 4 MG tablet Take 4 mg by mouth 3 (three) times daily as needed for nausea or vomiting.     pantoprazole (PROTONIX) 40 MG tablet Take 1 tablet (40 mg total) by mouth 2 (two) times daily. 180 tablet 3   potassium chloride SA (KLOR-CON M) 20 MEQ tablet Take 1 tablet (20 mEq total) by mouth daily. Take 73mEq (2 tablets) on 01/02/22. Then start 10mEq (1 tab) daily on 01/03/22. 30 tablet 0   sacubitril-valsartan (ENTRESTO) 24-26 MG Take 1 tablet by mouth 2 (two) times daily. 60 tablet 6   No current facility-administered medications for this visit.   Allergies  Allergen Reactions   Codeine Nausea And Vomiting and Other (See Comments)    Upsets the stomach   Social History   Socioeconomic History   Marital status: Married    Spouse name: Not on file   Number of children: Not on file   Years of education: Not on file   Highest education level: Not on file  Occupational History   Occupation: retired  Tobacco Use   Smoking status: Former    Packs/day: 1.00    Years: 65.00    Total pack years: 65.00    Types: Cigarettes    Quit date: 09/2020    Years since quitting: 1.3   Smokeless tobacco: Never  Vaping Use   Vaping Use: Never used  Substance and Sexual Activity   Alcohol use: No    Alcohol/week: 0.0 standard drinks of alcohol    Comment: rarely   Drug use: No   Sexual activity: Not on file  Other Topics Concern   Not on file  Social History Narrative   Not on file   Social Determinants of Health   Financial Resource Strain: Not on file  Food Insecurity: Not on file  Transportation Needs: No Transportation Needs (10/28/2020)   PRAPARE - Hydrologist (Medical): No    Lack of Transportation (Non-Medical): No  Physical Activity: Not on file  Stress: Not on file  Social Connections: Not on file  Intimate Partner Violence: Not on file   Family  History  Problem Relation Age of Onset   Breast cancer Sister    Breast cancer Sister    There were no vitals taken for this visit.  Wt Readings from Last 3 Encounters:  12/30/21 51 kg (112 lb 6.4 oz)  11/30/21 50.8 kg (111 lb 14.4 oz)  08/09/21 49.4 kg (109 lb)   PHYSICAL EXAM: General:  elderly, frail appearing. No respiratory difficulty HEENT: normal Neck: supple. no JVD. Carotids 2+ bilat; no bruits. No lymphadenopathy or thyromegaly appreciated. Cor: PMI nondisplaced. Regular rate & rhythm. No rubs, gallops  or murmurs. Lungs: clear Abdomen: soft, nontender, nondistended. No hepatosplenomegaly. No bruits or masses. Good bowel sounds. Extremities: no cyanosis, clubbing, rash, thin extremities, no edema Neuro: alert & oriented x 3, cranial nerves grossly intact. moves all 4 extremities w/o difficulty. Affect pleasant.   ECG (personally reviewed): 2nd degree type I AVB, QTc 449 ms.   ASSESSMENT & PLAN: 1. PAF  - first diagnosed 09/2020, cath nonobstructive CAD. Chemical conversion w/ amiodarone - PO amiodarone ultimately discontinued (h/o severe COPD) - Recurrent afib detected 5/23 but was well rate controlled - Recent admit with AF  w/ RVR up to 150s, symptomatic w/ weakness. Converted on amio gtt.  - she does not tolerate Afib well (prior h/o tachy mediated CM and cardiogenic shock). Long term suppression w/ amiodarone not ideal w/ COPD.  - 2nd deg type 1 AVB on EKG today in setting of hyperkalemia. No Afib. Continue amiodarone 200 mg bid for now, until EP appt 10/24 to discuss ablation. QTc ok on EKG  - Continue Eliquis 5 mg bid.  Denies abnormal bleeding.   2. Chronic Systolic Heart Failure - NICM - Echo (6/22): EF 20% RV moderately down RA massively dilated. Tachymediated from rapid Afib. LHC w/ nonobstructive CAD - Echo (12/22): EF normalized w/ restoration and maintenance of NSR, 55-60%, RV normal - Echo (5/23): EF back down, 45-50% (recurrent Afib w/ CVR) - Ltd echo  (8/23): EF 40-45%, RV mildly reduced, L/R atria severely dilated. Mild MR, Mod TR - Improved NYHA II. Dry on exam. Labs show AKI and hyperkalemia.  - Stop Entresto and KCl w/ AKI and hyperkalemia  - Continue Lasix 20 mg PRN.  - Will not restart Jardiance yet w/ AKI  - STAT BMP today    3. AKI on CKD Stage IIIB and Hyperkalemia - BMP yesterday w/ SCr 1.24>>2.11. K 5.6 (took AM dose of Entresto + KCL this morning). EKG w/ 2nd degree type I AVB. QTc 449 ms - Repeat STAT BMP. Pt given samples of Lokelma, in the event repeat K is still elevated. Depending on level of elevation, pt may need to go to ER for treatment. Pt and daughter aware and will expect telephone call w/ recs once labs resulted - Stop Entresto and KCl  - encouraged to increase fluid intake today and tomorrow    4. Hypertension   - Controlled on current regimen -  Stop Entresto per above    5. LUL Nodular Opacity - Noted on CXR  - Former smoker - Further work up per PCP   6. Tricuspid Regurgitation - Severe on prior Echo and LHC - ? Candidate for percutaneous TV clip .   Keep f/u w/ EP on 10/24 to discuss possible Afib ablation. Keep w/ Dr. Haroldine James 12/26.   Lyda Jester, PA-C  01/12/22

## 2022-01-12 NOTE — Telephone Encounter (Signed)
-----   Message from Jessica M Milford, FNP sent at 01/12/2022  9:08 AM EDT ----- Kidney function worse and K elevated. Stop Entresto and any KCL supplements  Needs a dose of Lokelma 5 mg x 1 today. Repeat BMET tomorrow.   Please call 

## 2022-01-12 NOTE — Telephone Encounter (Signed)
I attempted to reach patient and her husband answered.  I left a message for a return call.

## 2022-01-12 NOTE — Progress Notes (Signed)
Medication Samples have been provided to the patient.  Drug name: lokelma       Strength:        Qty: 11 packets (1 box)  LOT: [id number]  Exp.Date: 06/08/2023  Dosing instructions: one packet as directed (dissolve one packet in 1 oz of water and then drink)   The patient has been instructed regarding the correct time, dose, and frequency of taking this medication, including desired effects and most common side effects.   Kerry Dory 10:59 AM 01/12/2022

## 2022-01-12 NOTE — Patient Instructions (Signed)
STOP Entresto  STOP Potassium  Labs today We will only contact you if something comes back abnormal or we need to make some changes. Otherwise no news is good news!  Keep cardiology follow up as scheduled   Do the following things EVERYDAY: Weigh yourself in the morning before breakfast. Write it down and keep it in a log. Take your medicines as prescribed Eat low salt foods--Limit salt (sodium) to 2000 mg per day.  Stay as active as you can everyday Limit all fluids for the day to less than 2 liters   At the [facility], you and your health needs are our priority. As part of our continuing mission to provide you with exceptional heart care, we have created designated Provider Care Teams. These Care Teams include your primary Cardiologist (physician) and Advanced Practice Providers (APPs- Physician Assistants and Nurse Practitioners) who all work together to provide you with the care you need, when you need it.   You may see any of the following providers on your designated Care Team at your next follow up: Dr Glori Bickers Dr Loralie Champagne Dr. Roxana Hires, NP Lyda Jester, [place] [facility] [place], [place] Forestine Na, NP Audry Riles, PharmD   Please be sure to bring in all your medications bottles to every appointment.

## 2022-01-13 ENCOUNTER — Telehealth (HOSPITAL_COMMUNITY): Payer: Self-pay

## 2022-01-13 DIAGNOSIS — I5022 Chronic systolic (congestive) heart failure: Secondary | ICD-10-CM

## 2022-01-13 NOTE — Telephone Encounter (Signed)
-----   Message from Rafael Bihari, [place] sent at 01/12/2022  9:08 AM EDT ----- Kidney function worse and K elevated. Stop Entresto and any KCL supplements  Needs a dose of Lokelma 5 mg x 1 today. Repeat BMET tomorrow.   Please call

## 2022-01-13 NOTE — Telephone Encounter (Signed)
Changes were made during office visit. Lab appt scheduled, lab order entered.   Orders Placed This Encounter  Procedures   Basic Metabolic Panel (BMET)    Standing Status:   Future    Standing Expiration Date:   01/14/2023    Order Specific Question:   Release to patient    Answer:   Immediate

## 2022-01-17 ENCOUNTER — Other Ambulatory Visit: Payer: Self-pay | Admitting: Pharmacist

## 2022-01-17 ENCOUNTER — Ambulatory Visit (HOSPITAL_COMMUNITY)
Admission: RE | Admit: 2022-01-17 | Discharge: 2022-01-17 | Disposition: A | Discharge status: Home / Self Care | Payer: 59 | Source: Ambulatory Visit | Attending: Cardiology | Admitting: Cardiology

## 2022-01-17 DIAGNOSIS — I5022 Chronic systolic (congestive) heart failure: Secondary | ICD-10-CM | POA: Insufficient documentation

## 2022-01-17 DIAGNOSIS — I4819 Other persistent atrial fibrillation: Secondary | ICD-10-CM

## 2022-01-17 LAB — BASIC METABOLIC PANEL
Anion gap: 7 (ref 5–15)
BUN: 26 mg/dL — ABNORMAL HIGH (ref 8–23)
CO2: 22 mmol/L (ref 22–32)
Calcium: 8.7 mg/dL — ABNORMAL LOW (ref 8.9–10.3)
Chloride: 107 mmol/L (ref 98–111)
Creatinine, Ser: 1.53 mg/dL — ABNORMAL HIGH (ref 0.44–1.00)
GFR, Estimated: 35 mL/min — ABNORMAL LOW (ref >60)
Glucose, Bld: 101 mg/dL — ABNORMAL HIGH (ref 70–99)
Potassium: 4.4 mmol/L (ref 3.5–5.1)
Sodium: 136 mmol/L (ref 135–145)

## 2022-01-17 MED ORDER — APIXABAN 2.5 MG PO TABS: 2.5000 mg | ORAL_TABLET | Freq: Two times a day (BID) | ORAL | 11 refills | Status: DC

## 2022-01-31 ENCOUNTER — Encounter (HOSPITAL_COMMUNITY): Payer: 59

## 2022-01-31 ENCOUNTER — Ambulatory Visit: Payer: Medicare Other | Attending: Cardiovascular Disease | Admitting: Cardiovascular Disease

## 2022-01-31 ENCOUNTER — Encounter: Payer: Self-pay | Admitting: Cardiovascular Disease

## 2022-01-31 VITALS — BP 110/80 | HR 76 | Ht <= 58 in | Wt 115.0 lb

## 2022-01-31 DIAGNOSIS — I4819 Other persistent atrial fibrillation: Secondary | ICD-10-CM

## 2022-01-31 MED ORDER — METOPROLOL TARTRATE 50 MG PO TABS: 50.0000 mg | ORAL_TABLET | Freq: Once | ORAL | 0 refills | Status: DC | PRN

## 2022-01-31 NOTE — Progress Notes (Signed)
Cardiology Office Note:    Date:  01/31/2022   ID:  Laurie James, DOB 23-Dec-1945, MRN [id number]  PCP:  Wenda Low, [place] [facility] Cardiologist:  Jenkins Rouge, MD Electrophysiologist:  Melida Quitter, MD     Referring MD: Rafael Bihari, FNP   History of Present Illness:    Laurie James Laurie James is a 76 y.o. female with a hx documented below -- including CHFrEF -- referred to electrophysiology clinic for management of atrial fibrillation.  Atrial fibrillation was first diagnosed in June 2022 and managed with amiodarone.  Amiodarone was discontinued due to severe COPD.  A-fib recurred in May 2023 but was well rate controlled at that time.  She had recurrence off of amiodarone, admitted with A-fib and RVR with rates up to 150s, CHF.  Amiodarone was restarted, but she was thought not to be a candidate to to potential pulmonary toxicity  Past Medical History:  Diagnosis Date   AMD (age related macular degeneration)    COPD (chronic obstructive pulmonary disease) ([place])    Depression    Hiatal hernia    Intermittent low back pain    Memory impairment    Midsternal chest pain    a. 12/2011 Cardiac CTA Ca++ score of 103.3 (80th %), LAD <50p/m, RCA 50-75.   Osteoporosis    PAF (paroxysmal atrial fibrillation) ([facility]) 09/2020   Vitamin B 12 deficiency     Past Surgical History:  Procedure Laterality Date   ABDOMINAL HYSTERECTOMY     IR THORACENTESIS ASP PLEURAL SPACE W/IMG GUIDE  10/20/2020   RIGHT/LEFT HEART CATH AND CORONARY ANGIOGRAPHY N/A 10/07/2020   Procedure: RIGHT/LEFT HEART CATH AND CORONARY ANGIOGRAPHY;  Surgeon: Lorretta Harp, MD;  Location: [facility];  Service: Cardiovascular;  Laterality: N/A;   TEE WITHOUT CARDIOVERSION N/A 10/25/2020   Procedure: TRANSESOPHAGEAL ECHOCARDIOGRAM (TEE);  Surgeon: Sueanne Margarita, MD;  Location: [facility] ENDOSCOPY;  Service: Cardiovascular;  Laterality: N/A;    Current  Medications: Current Meds  Medication Sig   albuterol (VENTOLIN HFA) 108 (90 Base) MCG/ACT inhaler Inhale 2 puffs into the lungs every 4 (four) hours as needed for wheezing.   amiodarone (PACERONE) 200 MG tablet Take 1 tablet (200 mg total) by mouth 2 (two) times daily.   apixaban (ELIQUIS) 2.5 MG TABS tablet Take 1 tablet (2.5 mg total) by mouth 2 (two) times daily.   atorvastatin (LIPITOR) 40 MG tablet Take 1 tablet by mouth once daily   cyanocobalamin (,VITAMIN B-12,) 1000 MCG/ML injection Inject 1,000 mcg into the muscle every 30 (thirty) days.   furosemide (LASIX) 20 MG tablet Take 1 tablet (20 mg total) by mouth as needed. Leg edema   nitroGLYCERIN (NITROSTAT) 0.4 MG SL tablet Place 1 tablet (0.4 mg total) under the tongue every 5 (five) minutes x 3 doses as needed for chest pain.   ondansetron (ZOFRAN) 4 MG tablet Take 4 mg by mouth 3 (three) times daily as needed for nausea or vomiting.   pantoprazole (PROTONIX) 40 MG tablet Take 1 tablet (40 mg total) by mouth 2 (two) times daily.     Allergies:   Codeine   Social History   Socioeconomic History   Marital status: Married    Spouse name: Not on file   Number of children: Not on file   Years of education: Not on file   Highest education level: Not on file  Occupational History   Occupation: retired  Tobacco Use  Smoking status: Former    Packs/day: 1.00    Years: 65.00    Total pack years: 65.00    Types: Cigarettes    Quit date: 09/2020    Years since quitting: 1.3   Smokeless tobacco: Never  Vaping Use   Vaping Use: Never used  Substance and Sexual Activity   Alcohol use: No    Alcohol/week: 0.0 standard drinks of alcohol    Comment: rarely   Drug use: No   Sexual activity: Not on file  Other Topics Concern   Not on file  Social History Narrative   Not on file   Social Determinants of Health   Financial Resource Strain: Not on file  Food Insecurity: Not on file  Transportation Needs: No Transportation  Needs (10/28/2020)   PRAPARE - Hydrologist (Medical): No    Lack of Transportation (Non-Medical): No  Physical Activity: Not on file  Stress: Not on file  Social Connections: Not on file     Family History: The patient's family history includes Breast cancer in her sister and sister.  ROS:   Please see the history of present illness.    All other systems reviewed and are negative.  EKGs/Labs/Other Studies Reviewed:     - Echo (8/23): EF 40-45%, grade II DD, RV mildly reduced, moderate TR   - Echo (6/23): EF 45-50%, RV ok, severe TR   - Echo (10/06/20): EF 55-60 %    - LHC (10/07/2020) showed mild 40% segmental RCA stenosis as well as 50 to 60% second diagonal branch stenosis   RHC 6/22 RA 3 PA 17/2 (8) PCWP 7 PVR  0.25 WU Fick 2.6/2.0 (LVEF was low)   - TEE (7/22): EF 20% RV moderately down RA massively dilated. Heavy smoke in RA/LA and aortic root.   EKG:  Last EKG results: today - suspect sinus rhythm with low atrial amplitude. Artifact precludes interpretation.   Recent Labs: 03/21/2021: ALT 17 11/28/2021: TSH 4.419 11/29/2021: Hemoglobin 9.2; Platelets 226 12/30/2021: B Natriuretic Peptide 816.1; Magnesium 2.1 01/17/2022: BUN 26; Creatinine, Ser 1.53; Potassium 4.4; Sodium 136           Physical Exam:    VS:  BP 110/80 (BP Location: Left Arm, Patient Position: Sitting, Cuff Size: Normal)   Pulse 76   Ht 4\' 8"  (1.422 m)   Wt 115 lb (52.2 kg)   SpO2 97%   BMI 25.78 kg/m     Wt Readings from Last 3 Encounters:  01/31/22 115 lb (52.2 kg)  01/12/22 110 lb 6.4 oz (50.1 kg)  12/30/21 112 lb 6.4 oz (51 kg)     GEN:  Well nourished, well developed in no acute distress CARDIAC: RRR, no murmurs, rubs, gallops RESPIRATORY:  Normal work of breathing MUSCULOSKELETAL: no edema    ASSESSMENT & PLAN:    Atrial fibrillation: paroxysmal, symptomatic with CHF, uncontrolled rates. Qtc is > 450 today (on amiodarone), and she is a poor  candidate for long term amiodarone. I agree ablation is the preferred option. We discussed the indication, rationale, logistics, anticipated benefits, and potential risks of the ablation procedure including but not limited to -- bleed at the groin access site, chest pain, damage to nearby organs such as the diaphragm, lungs, or esophagus, need for a drainage tube, or prolonged hospitalization. I explained that the risk for stroke, heart attack, need for open chest surgery, or even death is very low but not zero. She expressed understanding and wishes to  proceed.  Chronic CHFrEF: EF initially 20%, normalized with rhythm control, dropped again with recurrent AF and RVR. Suspect tachy strongly contributing. Appears euvolemic today. GDMT per CHF team has been complicated by AKI and hyperkalemia.   AKI with CKD, hyperkalemia: will recheck labs closer to procedure. If Cr still limited, will likely need to consider TEE for LAA clearance.          Medication Adjustments/Labs and Tests Ordered: Current medicines are reviewed at length with the patient today.  Concerns regarding medicines are outlined above.  Orders Placed This Encounter  Procedures   EKG 12-Lead   No orders of the defined types were placed in this encounter.    Signed, Melida Quitter, MD  01/31/2022 2:49 PM    [place]

## 2022-01-31 NOTE — Addendum Note (Signed)
Addended by: Bernestine Amass on: 01/31/2022 03:19 PM   Modules accepted: Orders

## 2022-01-31 NOTE — Patient Instructions (Signed)
Medication Instructions:  Your physician recommends that you continue on your current medications as directed. Please refer to the Current Medication list given to you today.  *If you need a refill on your cardiac medications before your next appointment, please call your pharmacy*   Lab Work: BMET and CBC If you have labs (blood work) drawn today and your tests are completely normal, you will receive your results only by: MyChart Message (if you have MyChart) OR A paper copy in the mail If you have any lab test that is abnormal or we need to change your treatment, we will call you to review the results.   Testing/Procedures: Your physician has requested that you have cardiac CT. Cardiac computed tomography (CT) is a painless test that uses an x-ray machine to take clear, detailed pictures of your heart. For further information please visit www.cardiosmart.org. Please follow instruction sheet as given.  Your physician has recommended that you have an ablation. Catheter ablation is a medical procedure used to treat some cardiac arrhythmias (irregular heartbeats). During catheter ablation, a long, thin, flexible tube is put into a blood vessel in your groin (upper thigh), or neck. This tube is called an ablation catheter. It is then guided to your heart through the blood vessel. Radio frequency waves destroy small areas of heart tissue where abnormal heartbeats may cause an arrhythmia to start. Please see the instruction sheet given to you today.  Follow-Up: At [facility], you and your health needs are our priority.  As part of our continuing mission to provide you with exceptional heart care, we have created designated Provider Care Teams.  These Care Teams include your primary Cardiologist (physician) and Advanced Practice Providers (APPs -  Physician Assistants and Nurse Practitioners) who all work together to provide you with the care you need, when you need it.  Your next  appointment:   See instruction letter  Important Information About Sugar       

## 2022-02-09 ENCOUNTER — Telehealth: Payer: Self-pay

## 2022-02-09 NOTE — Patient Outreach (Signed)
  Care Coordination   02/09/2022 Name: Laurie James MRN: [id number] DOB: [birth year]   Care Coordination Outreach Attempts:  An unsuccessful telephone outreach was attempted today to offer the patient information about available care coordination services as a benefit of their health plan.   Follow Up Plan:  Additional outreach attempts will be made to offer the patient care coordination information and services.   Encounter Outcome:  No Answer  Care Coordination Interventions Activated:  No   Care Coordination Interventions:  No, not indicated    Enzo Montgomery, RN,BSN,CCM [facility] Telephonic Care Management Coordinator Direct Phone: [phone] Toll Free: [phone] Fax: [phone]

## 2022-02-10 DIAGNOSIS — S81812A Laceration without foreign body, left lower leg, initial encounter: Secondary | ICD-10-CM | POA: Diagnosis not present

## 2022-02-14 ENCOUNTER — Ambulatory Visit: Payer: Medicare Other | Attending: Cardiovascular Disease

## 2022-02-14 DIAGNOSIS — I4819 Other persistent atrial fibrillation: Secondary | ICD-10-CM

## 2022-02-15 LAB — CBC WITH DIFFERENTIAL/PLATELET
Basophils Absolute: 0 10*3/uL (ref 0.0–0.2)
Basos: 0 %
EOS (ABSOLUTE): 1.7 10*3/uL — ABNORMAL HIGH (ref 0.0–0.4)
Eos: 20 %
Hematocrit: 27.4 % — ABNORMAL LOW (ref 34.0–46.6)
Hemoglobin: 9.2 g/dL — ABNORMAL LOW (ref 11.1–15.9)
Immature Grans (Abs): 0 10*3/uL (ref 0.0–0.1)
Immature Granulocytes: 0 %
Lymphocytes Absolute: 1.3 10*3/uL (ref 0.7–3.1)
Lymphs: 14 %
MCH: 31 pg (ref 26.6–33.0)
MCHC: 33.6 g/dL (ref 31.5–35.7)
MCV: 92 fL (ref 79–97)
Monocytes Absolute: 0.6 10*3/uL (ref 0.1–0.9)
Monocytes: 7 %
Neutrophils Absolute: 5.1 10*3/uL (ref 1.4–7.0)
Neutrophils: 59 %
Platelets: 242 10*3/uL (ref 150–450)
RBC: 2.97 x10E6/uL — ABNORMAL LOW (ref 3.77–5.28)
RDW: 14.7 % (ref 11.7–15.4)
WBC: 8.8 10*3/uL (ref 3.4–10.8)

## 2022-02-15 LAB — BASIC METABOLIC PANEL
BUN/Creatinine Ratio: 15 (ref 12–28)
BUN: 18 mg/dL (ref 8–27)
CO2: 21 mmol/L (ref 20–29)
Calcium: 9.1 mg/dL (ref 8.7–10.3)
Chloride: 105 mmol/L (ref 96–106)
Creatinine, Ser: 1.22 mg/dL — ABNORMAL HIGH (ref 0.57–1.00)
Glucose: 80 mg/dL (ref 70–99)
Potassium: 4.3 mmol/L (ref 3.5–5.2)
Sodium: 139 mmol/L (ref 134–144)
eGFR: 46 mL/min/{1.73_m2} — ABNORMAL LOW (ref >59)

## 2022-02-20 DIAGNOSIS — R3121 Asymptomatic microscopic hematuria: Secondary | ICD-10-CM | POA: Diagnosis not present

## 2022-02-20 DIAGNOSIS — R82998 Other abnormal findings in urine: Secondary | ICD-10-CM | POA: Diagnosis not present

## 2022-02-28 ENCOUNTER — Telehealth (HOSPITAL_COMMUNITY): Payer: Self-pay | Admitting: Emergency Medicine

## 2022-02-28 NOTE — Telephone Encounter (Signed)
Reaching out to patient to offer assistance regarding upcoming cardiac imaging study; pt verbalizes understanding of appt date/time, parking situation and where to check in, pre-test NPO status and medications ordered, and verified current allergies; name and call back number provided for further questions should they arise Rockwell Alexandria RN Navigator Cardiac Imaging [facility] [phone] office [phone] cell   Arrival 845 Holding lasix, taking metoprolol if HR > 80

## 2022-03-01 ENCOUNTER — Ambulatory Visit (HOSPITAL_BASED_OUTPATIENT_CLINIC_OR_DEPARTMENT_OTHER)
Admission: RE | Admit: 2022-03-01 | Discharge: 2022-03-01 | Disposition: A | Discharge status: Home / Self Care | Payer: Medicare Other | Source: Ambulatory Visit | Attending: Cardiovascular Disease | Admitting: Cardiovascular Disease

## 2022-03-01 DIAGNOSIS — I4819 Other persistent atrial fibrillation: Secondary | ICD-10-CM | POA: Diagnosis not present

## 2022-03-01 MED ORDER — IOHEXOL 350 MG/ML SOLN
100.0000 mL | Freq: Once | INTRAVENOUS | Status: AC | PRN
  Administered 2022-03-01: 80 mL via INTRAVENOUS

## 2022-03-07 NOTE — Anesthesia Preprocedure Evaluation (Signed)
Anesthesia Evaluation  Patient identified by MRN, date of birth, ID band Patient awake    Reviewed: Allergy & Precautions, NPO status , Patient's Chart, lab work & pertinent test results  History of Anesthesia Complications Negative for: history of anesthetic complications  Airway Mallampati: III  TM Distance: >3 FB Neck ROM: Limited    Dental  (+) Edentulous Upper, Edentulous Lower   Pulmonary neg shortness of breath, sleep apnea (patient denies) , COPD (no recent inhaler use), neg recent URI, former smoker   Pulmonary exam normal breath sounds clear to auscultation       Cardiovascular pulmonary hypertension(-) angina + CAD and +CHF (EF 40-45%, grade II diastolic dysfunction; mildly reduced RV function)  + dysrhythmias (prolonged QT) Atrial Fibrillation + Valvular Problems/Murmurs (mild MR, moderate TR) MR  Rhythm:Irregular Rate:Normal  TTE 11/29/2021: IMPRESSIONS     1. Hypokinetic basal-mid anterior/anteroseptal wall. Left ventricular  ejection fraction, by estimation, is 40 to 45%. The left ventricle has  mildly decreased function. The left ventricle demonstrates global  hypokinesis. Left ventricular diastolic  parameters are consistent with Grade II diastolic dysfunction  (pseudonormalization).   2. Right ventricular systolic function is mildly reduced. The right  ventricular size is moderately enlarged. There is moderately elevated  pulmonary artery systolic pressure. The estimated right ventricular  systolic pressure is 54.0 mmHg.   3. Left atrial size was severely dilated.   4. Right atrial size was severely dilated.   5. The mitral valve is normal in structure. Mild mitral valve  regurgitation.   6. Tricuspid valve regurgitation is moderate.   7. The aortic valve was not well visualized. Aortic valve regurgitation  is trivial.   8. The inferior vena cava is dilated in size with >50% respiratory  variability,  suggesting right atrial pressure of 8 mmHg.   Heart cath 10/07/2020:  2nd Diag lesion is 60% stenosed.  Prox RCA lesion is 40% stenosed.  Ost RCA lesion is 30% stenosed.    Neuro/Psych neg Seizures PSYCHIATRIC DISORDERS  Depression    Mild cognitive impairment    GI/Hepatic Neg liver ROS, hiatal hernia,GERD  Controlled,,  Endo/Other  negative endocrine ROS    Renal/GU CRFRenal disease     Musculoskeletal   Abdominal  (+) - obese  Peds  Hematology negative hematology ROS (+)   Anesthesia Other Findings Osteoporosis    Reproductive/Obstetrics                             Anesthesia Physical Anesthesia Plan  ASA: 4  Anesthesia Plan: General   Post-op Pain Management:    Induction: Intravenous  PONV Risk Score and Plan: 3 and Ondansetron, Dexamethasone and Treatment may vary due to age or medical condition  Airway Management Planned: Oral ETT  Additional Equipment:   Intra-op Plan:   Post-operative Plan:   Informed Consent: I have reviewed the patients History and Physical, chart, labs and discussed the procedure including the risks, benefits and alternatives for the proposed anesthesia with the patient or authorized representative who has indicated his/her understanding and acceptance.     Dental advisory given  Plan Discussed with: CRNA and Anesthesiologist  Anesthesia Plan Comments: (Risks of general anesthesia discussed including, but not limited to, sore throat, hoarse voice, chipped/damaged teeth, injury to vocal cords, nausea and vomiting, allergic reactions, lung infection, heart attack, stroke, and death. All questions answered.  Patient reports she does not have a DNR. )  Anesthesia Quick Evaluation  

## 2022-03-07 NOTE — Pre-Procedure Instructions (Signed)
Instructed patient on the following items: Arrival time 0630 Nothing to eat or drink after midnight No meds AM of procedure Responsible person to drive you home and stay with you for 24 hrs  Have you missed any doses of anti-coagulant Eliquis- hasn't missed any doses.   

## 2022-03-08 ENCOUNTER — Encounter (HOSPITAL_COMMUNITY)
Admission: RE | Disposition: A | Discharge status: Home / Self Care | Payer: Self-pay | Source: Ambulatory Visit | Attending: Cardiovascular Disease

## 2022-03-08 ENCOUNTER — Ambulatory Visit (HOSPITAL_COMMUNITY): Payer: Medicare Other | Admitting: Anesthesiology

## 2022-03-08 ENCOUNTER — Encounter (HOSPITAL_COMMUNITY): Payer: Self-pay | Admitting: Cardiovascular Disease

## 2022-03-08 ENCOUNTER — Ambulatory Visit (HOSPITAL_BASED_OUTPATIENT_CLINIC_OR_DEPARTMENT_OTHER): Payer: Medicare Other | Admitting: Anesthesiology

## 2022-03-08 ENCOUNTER — Ambulatory Visit (HOSPITAL_COMMUNITY)
Admission: RE | Admit: 2022-03-08 | Discharge: 2022-03-08 | Disposition: A | Discharge status: Home / Self Care | Payer: Medicare Other | Source: Ambulatory Visit | Attending: Cardiovascular Disease | Admitting: Cardiovascular Disease

## 2022-03-08 ENCOUNTER — Other Ambulatory Visit: Payer: Self-pay

## 2022-03-08 DIAGNOSIS — I110 Hypertensive heart disease with heart failure: Secondary | ICD-10-CM

## 2022-03-08 DIAGNOSIS — I480 Paroxysmal atrial fibrillation: Secondary | ICD-10-CM | POA: Insufficient documentation

## 2022-03-08 DIAGNOSIS — I5030 Unspecified diastolic (congestive) heart failure: Secondary | ICD-10-CM

## 2022-03-08 DIAGNOSIS — N189 Chronic kidney disease, unspecified: Secondary | ICD-10-CM | POA: Diagnosis not present

## 2022-03-08 DIAGNOSIS — E875 Hyperkalemia: Secondary | ICD-10-CM | POA: Insufficient documentation

## 2022-03-08 DIAGNOSIS — I2510 Atherosclerotic heart disease of native coronary artery without angina pectoris: Secondary | ICD-10-CM | POA: Diagnosis not present

## 2022-03-08 DIAGNOSIS — I4891 Unspecified atrial fibrillation: Secondary | ICD-10-CM

## 2022-03-08 DIAGNOSIS — I5022 Chronic systolic (congestive) heart failure: Secondary | ICD-10-CM | POA: Insufficient documentation

## 2022-03-08 DIAGNOSIS — M810 Age-related osteoporosis without current pathological fracture: Secondary | ICD-10-CM | POA: Diagnosis not present

## 2022-03-08 DIAGNOSIS — J449 Chronic obstructive pulmonary disease, unspecified: Secondary | ICD-10-CM | POA: Diagnosis not present

## 2022-03-08 DIAGNOSIS — Z87891 Personal history of nicotine dependence: Secondary | ICD-10-CM | POA: Diagnosis not present

## 2022-03-08 DIAGNOSIS — N179 Acute kidney failure, unspecified: Secondary | ICD-10-CM | POA: Insufficient documentation

## 2022-03-08 HISTORY — PX: ATRIAL FIBRILLATION ABLATION: EP1191

## 2022-03-08 LAB — POCT ACTIVATED CLOTTING TIME
Activated Clotting Time: 266 seconds
Activated Clotting Time: 287 seconds

## 2022-03-08 SURGERY — ATRIAL FIBRILLATION ABLATION
Anesthesia: General

## 2022-03-08 MED ORDER — HEPARIN (PORCINE) IN NACL 1000-0.9 UT/500ML-% IV SOLN
INTRAVENOUS | Status: AC
  Filled 2022-03-08: qty 500

## 2022-03-08 MED ORDER — PHENYLEPHRINE HCL-NACL 20-0.9 MG/250ML-% IV SOLN
INTRAVENOUS | Status: DC | PRN
  Administered 2022-03-08: 40 ug/min via INTRAVENOUS

## 2022-03-08 MED ORDER — HEPARIN (PORCINE) IN NACL 1000-0.9 UT/500ML-% IV SOLN
INTRAVENOUS | Status: DC | PRN
  Administered 2022-03-08 (×4): 500 mL

## 2022-03-08 MED ORDER — DOBUTAMINE INFUSION FOR EP/ECHO/NUC (1000 MCG/ML)
INTRAVENOUS | Status: AC
  Filled 2022-03-08: qty 250

## 2022-03-08 MED ORDER — ROCURONIUM BROMIDE 10 MG/ML (PF) SYRINGE
PREFILLED_SYRINGE | INTRAVENOUS | Status: DC | PRN
  Administered 2022-03-08: 30 mg via INTRAVENOUS

## 2022-03-08 MED ORDER — DEXAMETHASONE SODIUM PHOSPHATE 10 MG/ML IJ SOLN
INTRAMUSCULAR | Status: DC | PRN
  Administered 2022-03-08: 5 mg via INTRAVENOUS

## 2022-03-08 MED ORDER — LIDOCAINE 2% (20 MG/ML) 5 ML SYRINGE
INTRAMUSCULAR | Status: DC | PRN
  Administered 2022-03-08: 40 mg via INTRAVENOUS

## 2022-03-08 MED ORDER — SODIUM CHLORIDE 0.9 % IV SOLN: INTRAVENOUS | Status: DC

## 2022-03-08 MED ORDER — ONDANSETRON HCL 4 MG/2ML IJ SOLN
INTRAMUSCULAR | Status: DC | PRN
  Administered 2022-03-08: 4 mg via INTRAVENOUS

## 2022-03-08 MED ORDER — ONDANSETRON HCL 4 MG/2ML IJ SOLN: 4.0000 mg | Freq: Four times a day (QID) | INTRAMUSCULAR | Status: DC | PRN

## 2022-03-08 MED ORDER — SODIUM CHLORIDE 0.9% FLUSH: 3.0000 mL | INTRAVENOUS | Status: DC | PRN

## 2022-03-08 MED ORDER — PROPOFOL 10 MG/ML IV BOLUS
INTRAVENOUS | Status: DC | PRN
  Administered 2022-03-08: 60 mg via INTRAVENOUS

## 2022-03-08 MED ORDER — COLCHICINE 0.6 MG PO TABS: 0.6000 mg | ORAL_TABLET | Freq: Two times a day (BID) | ORAL | 0 refills | Status: DC

## 2022-03-08 MED ORDER — SODIUM CHLORIDE 0.9% FLUSH: 3.0000 mL | Freq: Two times a day (BID) | INTRAVENOUS | Status: DC

## 2022-03-08 MED ORDER — HEPARIN SODIUM (PORCINE) 1000 UNIT/ML IJ SOLN
INTRAMUSCULAR | Status: DC | PRN
  Administered 2022-03-08: 1000 [IU] via INTRAVENOUS

## 2022-03-08 MED ORDER — ACETAMINOPHEN 325 MG PO TABS: 650.0000 mg | ORAL_TABLET | ORAL | Status: DC | PRN

## 2022-03-08 MED ORDER — SODIUM CHLORIDE 0.9 % IV SOLN: 250.0000 mL | INTRAVENOUS | Status: DC | PRN

## 2022-03-08 MED ORDER — SUGAMMADEX SODIUM 200 MG/2ML IV SOLN
INTRAVENOUS | Status: DC | PRN
  Administered 2022-03-08 (×2): 50 mg via INTRAVENOUS

## 2022-03-08 MED ORDER — PROTAMINE SULFATE 10 MG/ML IV SOLN
INTRAVENOUS | Status: DC | PRN
  Administered 2022-03-08 (×2): 10 mg via INTRAVENOUS
  Administered 2022-03-08: 25 mg via INTRAVENOUS
  Administered 2022-03-08: 5 mg via INTRAVENOUS

## 2022-03-08 MED ORDER — HEPARIN SODIUM (PORCINE) 1000 UNIT/ML IJ SOLN
INTRAMUSCULAR | Status: DC | PRN
  Administered 2022-03-08 (×2): 2000 [IU] via INTRAVENOUS
  Administered 2022-03-08: 8000 [IU] via INTRAVENOUS

## 2022-03-08 MED ORDER — DOBUTAMINE INFUSION FOR EP/ECHO/NUC (1000 MCG/ML)
INTRAVENOUS | Status: DC | PRN
  Administered 2022-03-08: 20 ug/kg/min via INTRAVENOUS

## 2022-03-08 MED ORDER — FENTANYL CITRATE (PF) 250 MCG/5ML IJ SOLN
INTRAMUSCULAR | Status: DC | PRN
  Administered 2022-03-08: 50 ug via INTRAVENOUS

## 2022-03-08 MED ORDER — PHENYLEPHRINE 80 MCG/ML (10ML) SYRINGE FOR IV PUSH (FOR BLOOD PRESSURE SUPPORT)
PREFILLED_SYRINGE | INTRAVENOUS | Status: DC | PRN
  Administered 2022-03-08: 80 ug via INTRAVENOUS
  Administered 2022-03-08: 40 ug via INTRAVENOUS
  Administered 2022-03-08: 80 ug via INTRAVENOUS

## 2022-03-08 SURGICAL SUPPLY — 19 items
CATH 8FR REPROCESSED SOUNDSTAR (CATHETERS) ×1 IMPLANT
CATH 8FR SOUNDSTAR REPROCESSED (CATHETERS) IMPLANT
CATH ABLAT QDOT MICRO BI TC FJ (CATHETERS) IMPLANT
CATH OCTARAY 2.0 F 3-3-3-3-3 (CATHETERS) IMPLANT
CATH PIGTAIL STEERABLE D1 8.7 (WIRE) IMPLANT
CATH S-M CIRCA TEMP PROBE (CATHETERS) IMPLANT
CATH WEB BI DIR CSDF CRV REPRO (CATHETERS) IMPLANT
CLOSURE PERCLOSE PROSTYLE (VASCULAR PRODUCTS) IMPLANT
COVER SWIFTLINK CONNECTOR (BAG) ×2 IMPLANT
DEVICE CLOSURE MYNXGRIP 6/7F (Vascular Products) IMPLANT
PACK EP LATEX FREE (CUSTOM PROCEDURE TRAY) ×1
PACK EP LF (CUSTOM PROCEDURE TRAY) ×2 IMPLANT
PAD DEFIB RADIO PHYSIO CONN (PAD) ×2 IMPLANT
PATCH CARTO3 (PAD) IMPLANT
SHEATH CARTO VIZIGO SM CVD (SHEATH) IMPLANT
SHEATH PINNACLE 8F 10CM (SHEATH) IMPLANT
SHEATH PINNACLE 9F 10CM (SHEATH) IMPLANT
SHEATH PROBE COVER 6X72 (BAG) IMPLANT
TUBING SMART ABLATE COOLFLOW (TUBING) IMPLANT

## 2022-03-08 NOTE — Anesthesia Procedure Notes (Signed)
Procedure Name: Intubation Date/Time: 03/08/2022 8:20 AM  Performed by: Betha Loa, CRNAPre-anesthesia Checklist: Patient identified, Emergency Drugs available, Suction available and Patient being monitored Patient Re-evaluated:Patient Re-evaluated prior to induction Oxygen Delivery Method: Circle System Utilized Preoxygenation: Pre-oxygenation with 100% oxygen Induction Type: IV induction Ventilation: Mask ventilation without difficulty and Oral airway inserted - appropriate to patient size Laryngoscope Size: Mac and 3 Grade View: Grade I Tube type: Oral Tube size: 7.0 mm Number of attempts: 1 Airway Equipment and Method: Stylet and Oral airway Placement Confirmation: ETT inserted through vocal cords under direct vision, positive ETCO2 and breath sounds checked- equal and bilateral Secured at: 21 cm Tube secured with: Tape Dental Injury: Teeth and Oropharynx as per pre-operative assessment  Comments: Head/neck remain in position of comfort per pt

## 2022-03-08 NOTE — Discharge Instructions (Signed)

## 2022-03-08 NOTE — H&P (Signed)
Cardiology Office Note:    Date:  03/08/2022   ID:  Laurie James, DOB September 29, 1945, MRN [id number]  PCP:  Georgann Housekeeper, MD   [facility] Providers Cardiologist:  Charlton Haws, MD Electrophysiologist:  Maurice Small, MD     Referring MD: No ref. provider found   History of Present Illness:    Laurie James is a 76 y.o. female with a hx documented below -- including CHFrEF -- referred to electrophysiology clinic for management of atrial fibrillation. She presents today for ablation.  Atrial fibrillation was first diagnosed in June 2022 and managed with amiodarone.  Amiodarone was discontinued due to severe COPD.  A-fib recurred in May 2023 but was well rate controlled at that time.  She had recurrence off of amiodarone, admitted with A-fib and RVR with rates up to 150s, CHF.  Amiodarone was restarted, but she was thought not to be a candidate to to potential pulmonary toxicity.  She has not missed any doses of anticoagulation since her CT. No changes in medications or diagnoses.  Past Medical History:  Diagnosis Date   AMD (age related macular degeneration)    COPD (chronic obstructive pulmonary disease) ([facility])    Depression    Hiatal hernia    Intermittent low back pain    Memory impairment    Midsternal chest pain    a. 12/2011 Cardiac CTA Ca++ score of 103.3 (80th %), LAD <50p/m, RCA 50-75.   Osteoporosis    PAF (paroxysmal atrial fibrillation) ([facility]) 09/2020   Vitamin B 12 deficiency     Past Surgical History:  Procedure Laterality Date   ABDOMINAL HYSTERECTOMY     IR THORACENTESIS ASP PLEURAL SPACE W/IMG GUIDE  10/20/2020   RIGHT/LEFT HEART CATH AND CORONARY ANGIOGRAPHY N/A 10/07/2020   Procedure: RIGHT/LEFT HEART CATH AND CORONARY ANGIOGRAPHY;  Surgeon: Runell Gess, MD;  Location: [facility];  Service: Cardiovascular;  Laterality: N/A;   TEE WITHOUT CARDIOVERSION N/A 10/25/2020   Procedure: TRANSESOPHAGEAL  ECHOCARDIOGRAM (TEE);  Surgeon: Quintella Reichert, MD;  Location: [facility] ENDOSCOPY;  Service: Cardiovascular;  Laterality: N/A;    Current Medications: Current Meds  Medication Sig   amiodarone (PACERONE) 200 MG tablet Take 1 tablet (200 mg total) by mouth 2 (two) times daily.   apixaban (ELIQUIS) 2.5 MG TABS tablet Take 1 tablet (2.5 mg total) by mouth 2 (two) times daily.   atorvastatin (LIPITOR) 40 MG tablet Take 1 tablet by mouth once daily   cyanocobalamin (,VITAMIN B-12,) 1000 MCG/ML injection Inject 1,000 mcg into the muscle every 30 (thirty) days.   furosemide (LASIX) 20 MG tablet Take 1 tablet (20 mg total) by mouth as needed. Leg edema   pantoprazole (PROTONIX) 40 MG tablet Take 1 tablet (40 mg total) by mouth 2 (two) times daily.     Allergies:   Codeine   Social History   Socioeconomic History   Marital status: Married    Spouse name: Not on file   Number of children: Not on file   Years of education: Not on file   Highest education level: Not on file  Occupational History   Occupation: retired  Tobacco Use   Smoking status: Former    Packs/day: 1.00    Years: 65.00    Total pack years: 65.00    Types: Cigarettes    Quit date: 09/2020    Years since quitting: 1.4   Smokeless tobacco: Never  Vaping Use   Vaping Use: Never used  Substance and Sexual Activity   Alcohol use: No    Alcohol/week: 0.0 standard drinks of alcohol    Comment: rarely   Drug use: No   Sexual activity: Not on file  Other Topics Concern   Not on file  Social History Narrative   Not on file   Social Determinants of Health   Financial Resource Strain: Not on file  Food Insecurity: Not on file  Transportation Needs: No Transportation Needs (10/28/2020)   PRAPARE - Hydrologist (Medical): No    Lack of Transportation (Non-Medical): No  Physical Activity: Not on file  Stress: Not on file  Social Connections: Not on file     Family History: The patient's  family history includes Breast cancer in her sister and sister.  ROS:   Please see the history of present illness.    All other systems reviewed and are negative.  EKGs/Labs/Other Studies Reviewed:     - Echo (8/23): EF 40-45%, grade II DD, RV mildly reduced, moderate TR   - Echo (6/23): EF 45-50%, RV ok, severe TR   - Echo (10/06/20): EF 55-60 %    - LHC (10/07/2020) showed mild 40% segmental RCA stenosis as well as 50 to 60% second diagonal branch stenosis   RHC 6/22 RA 3 PA 17/2 (8) PCWP 7 PVR  0.25 WU Fick 2.6/2.0 (LVEF was low)   - TEE (7/22): EF 20% RV moderately down RA massively dilated. Heavy smoke in RA/LA and aortic root.   EKG:  Last EKG results: today - suspect sinus rhythm with low atrial amplitude. Artifact precludes interpretation.   Recent Labs: 03/21/2021: ALT 17 11/28/2021: TSH 4.419 12/30/2021: B Natriuretic Peptide 816.1; Magnesium 2.1 02/14/2022: BUN 18; Creatinine, Ser 1.22; Hemoglobin 9.2; Platelets 242; Potassium 4.3; Sodium 139           Physical Exam:    VS:  BP (!) 157/87   Pulse 68   Temp (!) 97.5 F (36.4 C) (Temporal)   Ht 4\' 8"  (1.422 m)   Wt 50.3 kg   SpO2 100%   BMI 24.89 kg/m     Wt Readings from Last 3 Encounters:  03/08/22 50.3 kg  01/31/22 52.2 kg  01/12/22 50.1 kg     GEN:  Well nourished, well developed in no acute distress CARDIAC: RRR, no murmurs, rubs, gallops RESPIRATORY:  Normal work of breathing MUSCULOSKELETAL: no edema    ASSESSMENT & PLAN:    Atrial fibrillation: paroxysmal, symptomatic with CHF, uncontrolled rates. We discussed the indication, rationale, logistics, anticipated benefits, and potential risks of the ablation procedure including but not limited to -- bleed at the groin access site, chest pain, damage to nearby organs such as the diaphragm, lungs, or esophagus, need for a drainage tube, or prolonged hospitalization. I explained that the risk for stroke, heart attack, need for open chest  surgery, or even death is very low but not zero. She expressed understanding and wishes to proceed.  Chronic CHFrEF: EF initially 20%, normalized with rhythm control, dropped again with recurrent AF and RVR. Suspect tachy strongly contributing. Appears euvolemic today. GDMT per CHF team has been complicated by AKI and hyperkalemia.   AKI with CKD, hyperkalemia:           Medication Adjustments/Labs and Tests Ordered: Current medicines are reviewed at length with the patient today.  Concerns regarding medicines are outlined above.  Orders Placed This Encounter  Procedures   Informed Consent Details: Physician/Practitioner Attestation; Transcribe to  consent form and obtain patient signature   Initiate Pre-op Protocol   Void on call to EP Lab   Confirm CBC and BMP (or CMP) results within 7 days for inpatient and 30 days for outpatient:   Clip right and left femoral area PM before surgery   Clip right internal jugular area PM before surgery   Pre-admission testing diagnosis   EP STUDY   EKG 12-Lead   Insert peripheral IV   Meds ordered this encounter  Medications   0.9 %  sodium chloride infusion     Signed, Melida Quitter, MD  03/08/2022 7:47 AM    [place]

## 2022-03-08 NOTE — Anesthesia Postprocedure Evaluation (Signed)
Anesthesia Post Note  Patient: [facility]  Procedure(s) Performed: ATRIAL FIBRILLATION ABLATION     Patient location during evaluation: PACU Anesthesia Type: General Level of consciousness: awake Pain management: pain level controlled Vital Signs Assessment: post-procedure vital signs reviewed and stable Respiratory status: spontaneous breathing, nonlabored ventilation and respiratory function stable Cardiovascular status: blood pressure returned to baseline and stable Postop Assessment: no apparent nausea or vomiting Anesthetic complications: no   There were no known notable events for this encounter.  Last Vitals:  Vitals:   03/08/22 1145 03/08/22 1200  BP: 119/66 120/67  Pulse: 62 62  Resp: 15 17  Temp:    SpO2: 99% 98%    Last Pain:  Vitals:   03/08/22 1103  TempSrc:   PainSc: 0-No pain                 Linton Rump

## 2022-03-08 NOTE — Transfer of Care (Signed)
Immediate Anesthesia Transfer of Care Note  Patient: Reverie Vaquera [facility]  Procedure(s) Performed: ATRIAL FIBRILLATION ABLATION  Patient Location: PACU and Cath Lab  Anesthesia Type:General  Level of Consciousness: awake, patient cooperative, and responds to stimulation  Airway & Oxygen Therapy: Patient Spontanous Breathing and Patient connected to nasal cannula oxygen  Post-op Assessment: Report given to RN, Post -op Vital signs reviewed and stable, and Patient moving all extremities X 4  Post vital signs: Reviewed and stable  Last Vitals:  Vitals Value Taken Time  BP 98/52 03/08/22 1022  Temp    Pulse 59 03/08/22 1023  Resp 17 03/08/22 1023  SpO2 100 % 03/08/22 1023  Vitals shown include unvalidated device data.  Last Pain:  Vitals:   03/08/22 0646  TempSrc: Temporal  PainSc: 0-No pain      Patients Stated Pain Goal: 3 (03/08/22 8850)  Complications: There were no known notable events for this encounter.

## 2022-03-08 NOTE — Anesthesia Procedure Notes (Signed)
Arterial Line Insertion Start/End11/29/2023 7:42 AM, 03/08/2022 7:50 AM Performed by: Lonia Mad, CRNA, CRNA  Patient location: Pre-op. Preanesthetic checklist: patient identified, IV checked, site marked, risks and benefits discussed, surgical consent, monitors and equipment checked, pre-op evaluation, timeout performed and anesthesia consent Lidocaine 1% used for infiltration Left, radial was placed Catheter size: 20 G Hand hygiene performed  and maximum sterile barriers used  Allen's test indicative of satisfactory collateral circulation Attempts: 2 (1st attempt,unable to thread wire into radial artery completely. 2nd catheter attempt more proximal with success.) Procedure performed without using ultrasound guided technique. Following insertion, Biopatch and dressing applied. Post procedure assessment: normal  Patient tolerated the procedure well with no immediate complications.

## 2022-03-09 ENCOUNTER — Other Ambulatory Visit: Payer: Self-pay

## 2022-03-09 ENCOUNTER — Emergency Department (HOSPITAL_COMMUNITY)
Admission: EM | Admit: 2022-03-09 | Discharge: 2022-03-10 | Disposition: A | Discharge status: Home / Self Care | Payer: Medicare Other | Attending: Emergency Medicine | Admitting: Emergency Medicine

## 2022-03-09 DIAGNOSIS — D649 Anemia, unspecified: Secondary | ICD-10-CM | POA: Insufficient documentation

## 2022-03-09 DIAGNOSIS — Z7901 Long term (current) use of anticoagulants: Secondary | ICD-10-CM | POA: Diagnosis not present

## 2022-03-09 DIAGNOSIS — R6889 Other general symptoms and signs: Secondary | ICD-10-CM | POA: Diagnosis not present

## 2022-03-09 DIAGNOSIS — I2510 Atherosclerotic heart disease of native coronary artery without angina pectoris: Secondary | ICD-10-CM | POA: Insufficient documentation

## 2022-03-09 DIAGNOSIS — D72829 Elevated white blood cell count, unspecified: Secondary | ICD-10-CM | POA: Diagnosis not present

## 2022-03-09 DIAGNOSIS — R531 Weakness: Secondary | ICD-10-CM | POA: Diagnosis present

## 2022-03-09 DIAGNOSIS — R001 Bradycardia, unspecified: Secondary | ICD-10-CM | POA: Insufficient documentation

## 2022-03-09 DIAGNOSIS — J449 Chronic obstructive pulmonary disease, unspecified: Secondary | ICD-10-CM | POA: Insufficient documentation

## 2022-03-09 DIAGNOSIS — Z743 Need for continuous supervision: Secondary | ICD-10-CM | POA: Diagnosis not present

## 2022-03-09 DIAGNOSIS — I5020 Unspecified systolic (congestive) heart failure: Secondary | ICD-10-CM | POA: Insufficient documentation

## 2022-03-09 DIAGNOSIS — R079 Chest pain, unspecified: Secondary | ICD-10-CM | POA: Diagnosis not present

## 2022-03-09 DIAGNOSIS — Z79899 Other long term (current) drug therapy: Secondary | ICD-10-CM | POA: Insufficient documentation

## 2022-03-09 DIAGNOSIS — R0789 Other chest pain: Secondary | ICD-10-CM | POA: Diagnosis not present

## 2022-03-09 DIAGNOSIS — I499 Cardiac arrhythmia, unspecified: Secondary | ICD-10-CM | POA: Diagnosis not present

## 2022-03-09 DIAGNOSIS — N179 Acute kidney failure, unspecified: Secondary | ICD-10-CM | POA: Diagnosis not present

## 2022-03-09 LAB — CBC WITH DIFFERENTIAL/PLATELET
Abs Immature Granulocytes: 0.06 10*3/uL (ref 0.00–0.07)
Basophils Absolute: 0 10*3/uL (ref 0.0–0.1)
Basophils Relative: 0 %
Eosinophils Absolute: 0.7 10*3/uL — ABNORMAL HIGH (ref 0.0–0.5)
Eosinophils Relative: 6 %
HCT: 26.8 % — ABNORMAL LOW (ref 36.0–46.0)
Hemoglobin: 8.6 g/dL — ABNORMAL LOW (ref 12.0–15.0)
Immature Granulocytes: 1 %
Lymphocytes Relative: 9 %
Lymphs Abs: 1.1 10*3/uL (ref 0.7–4.0)
MCH: 31.6 pg (ref 26.0–34.0)
MCHC: 32.1 g/dL (ref 30.0–36.0)
MCV: 98.5 fL (ref 80.0–100.0)
Monocytes Absolute: 0.7 10*3/uL (ref 0.1–1.0)
Monocytes Relative: 6 %
Neutro Abs: 9.6 10*3/uL — ABNORMAL HIGH (ref 1.7–7.7)
Neutrophils Relative %: 78 %
Platelets: 215 10*3/uL (ref 150–400)
RBC: 2.72 MIL/uL — ABNORMAL LOW (ref 3.87–5.11)
RDW: 16.6 % — ABNORMAL HIGH (ref 11.5–15.5)
WBC: 12.2 10*3/uL — ABNORMAL HIGH (ref 4.0–10.5)
nRBC: 0 % (ref 0.0–0.2)

## 2022-03-09 LAB — COMPREHENSIVE METABOLIC PANEL
ALT: 77 U/L — ABNORMAL HIGH (ref 0–44)
AST: 69 U/L — ABNORMAL HIGH (ref 15–41)
Albumin: 3 g/dL — ABNORMAL LOW (ref 3.5–5.0)
Alkaline Phosphatase: 73 U/L (ref 38–126)
Anion gap: 7 (ref 5–15)
BUN: 37 mg/dL — ABNORMAL HIGH (ref 8–23)
CO2: 20 mmol/L — ABNORMAL LOW (ref 22–32)
Calcium: 8.6 mg/dL — ABNORMAL LOW (ref 8.9–10.3)
Chloride: 112 mmol/L — ABNORMAL HIGH (ref 98–111)
Creatinine, Ser: 2.06 mg/dL — ABNORMAL HIGH (ref 0.44–1.00)
GFR, Estimated: 25 mL/min — ABNORMAL LOW (ref >60)
Glucose, Bld: 109 mg/dL — ABNORMAL HIGH (ref 70–99)
Potassium: 4.5 mmol/L (ref 3.5–5.1)
Sodium: 139 mmol/L (ref 135–145)
Total Bilirubin: 0.4 mg/dL (ref 0.3–1.2)
Total Protein: 6.6 g/dL (ref 6.5–8.1)

## 2022-03-09 LAB — TROPONIN I (HIGH SENSITIVITY): Troponin I (High Sensitivity): 1105 ng/L (ref <18)

## 2022-03-09 LAB — PROTIME-INR
INR: 1.7 — ABNORMAL HIGH (ref 0.8–1.2)
Prothrombin Time: 19.4 seconds — ABNORMAL HIGH (ref 11.4–15.2)

## 2022-03-09 LAB — CBG MONITORING, ED: Glucose-Capillary: 92 mg/dL (ref 70–99)

## 2022-03-09 MED ORDER — ONDANSETRON HCL 4 MG/2ML IJ SOLN: 4.0000 mg | Freq: Once | INTRAMUSCULAR | Status: DC

## 2022-03-09 NOTE — ED Triage Notes (Signed)
Patient Laurie James [facility]. Patients family thinks she was having a stroke because she is sleepy and staring off into space. [employer] states on arrival to scene patient had no deficits. Patient is alert and oriented x4. She states she has no complaints. Patient did have an cardiac ablation yesterday.

## 2022-03-09 NOTE — ED Provider Notes (Signed)
[facility] EMERGENCY DEPARTMENT Provider Note   CSN: [id number] Arrival date & time: 03/09/22  2146     History  Chief Complaint  Patient presents with   Weakness    Laurie James is a 76 y.o. female who presents via EMS because her husband was concerned that she was "staring off in space" and falling asleep regularly.  Patient is history atrial fibrillation previous anticoagulated on Eliquis with last dose this morning .  Per family she had recurrent atrial fibrillation with multiple episodes of RVR and reportedly underwent ablation yesterday, which was reportedly uncomplicated. Was reportedly started on colchicine following procedure.  She did state she was short of breath with exertion today.  No chest pain.  No palpitations.  In addition to the above listed history is history of CHF, COPD, memory impairment.  Patient is asymptomatic and requests to be discharged home.  HPI     Home Medications Prior to Admission medications   Medication Sig Start Date End Date Taking? Authorizing Provider  albuterol (VENTOLIN HFA) 108 (90 Base) MCG/ACT inhaler Inhale 2 puffs into the lungs every 4 (four) hours as needed for wheezing. 06/08/20   [provider]  amiodarone (PACERONE) 200 MG tablet Take 1 tablet (200 mg total) by mouth 2 (two) times daily. 12/26/21   Bensimhon, Bevelyn Buckles, MD  apixaban (ELIQUIS) 2.5 MG TABS tablet Take 1 tablet (2.5 mg total) by mouth 2 (two) times daily. 01/17/22   Bensimhon, Bevelyn Buckles, MD  atorvastatin (LIPITOR) 40 MG tablet Take 1 tablet by mouth once daily 07/26/21   Wendall Stade, MD  colchicine 0.6 MG tablet Take 1 tablet (0.6 mg total) by mouth 2 (two) times daily for 5 days. 03/08/22 03/13/22  Graciella Freer, PA-C  cyanocobalamin (,VITAMIN B-12,) 1000 MCG/ML injection Inject 1,000 mcg into the muscle every 30 (thirty) days. 06/11/16   [provider]  furosemide (LASIX) 20 MG tablet Take 1 tablet (20 mg  total) by mouth as needed. Leg edema 12/30/21   Jacklynn Ganong, FNP  metoprolol tartrate (LOPRESSOR) 50 MG tablet Take 1 tablet (50 mg total) by mouth Once PRN for up to 1 dose (Take one tablet two hours prior to CT scan if heart rate is greater than 80). 01/31/22   Mealor, Roberts Gaudy, MD  nitroGLYCERIN (NITROSTAT) 0.4 MG SL tablet Place 1 tablet (0.4 mg total) under the tongue every 5 (five) minutes x 3 doses as needed for chest pain. 10/11/20   Filbert Schilder, NP  ondansetron (ZOFRAN) 4 MG tablet Take 4 mg by mouth 3 (three) times daily as needed for nausea or vomiting. 11/22/20   [provider]  pantoprazole (PROTONIX) 40 MG tablet Take 1 tablet (40 mg total) by mouth 2 (two) times daily. 08/23/21   Bensimhon, Bevelyn Buckles, MD      Allergies    Codeine    Review of Systems   Review of Systems  Constitutional:  Positive for fatigue.  Respiratory: Negative.    Cardiovascular: Negative.   Neurological:  Positive for weakness.    Physical Exam Updated Vital Signs BP (!) 97/58   Pulse (!) 44   Temp (!) 97.3 F (36.3 C) (Oral)   Resp 19   Ht 4\' 8"  (1.422 m)   Wt 50 kg   SpO2 99%   BMI 24.71 kg/m  Physical Exam Vitals and nursing note reviewed.  Constitutional:      Appearance: She is not ill-appearing or toxic-appearing.  HENT:     Head: Normocephalic and atraumatic.     Mouth/Throat:     Mouth: Mucous membranes are moist.     Pharynx: No oropharyngeal exudate or posterior oropharyngeal erythema.  Eyes:     General:        Right eye: No discharge.        Left eye: No discharge.     Extraocular Movements: Extraocular movements intact.     Conjunctiva/sclera: Conjunctivae normal.     Pupils: Pupils are equal, round, and reactive to light.  Cardiovascular:     Rate and Rhythm: Regular rhythm. Bradycardia present.     Pulses: Normal pulses.     Heart sounds: Normal heart sounds. No murmur heard. Pulmonary:     Effort: Pulmonary effort is normal. No respiratory  distress.     Breath sounds: Normal breath sounds. No wheezing or rales.  Abdominal:     General: Bowel sounds are normal. There is no distension.     Palpations: Abdomen is soft.     Tenderness: There is no abdominal tenderness. There is no guarding or rebound.  Musculoskeletal:        General: No deformity.     Cervical back: Neck supple.     Right lower leg: No edema.     Left lower leg: No edema.  Skin:    General: Skin is warm and dry.     Capillary Refill: Capillary refill takes less than 2 seconds.  Neurological:     General: No focal deficit present.     Mental Status: She is alert and oriented to person, place, and time. Mental status is at baseline.  Psychiatric:        Mood and Affect: Mood normal.     ED Results / Procedures / Treatments   Labs (all labs ordered are listed, but only abnormal results are displayed) Labs Reviewed  CBC WITH DIFFERENTIAL/PLATELET - Abnormal; Notable for the following components:      Result Value   WBC 12.2 (*)    RBC 2.72 (*)    Hemoglobin 8.6 (*)    HCT 26.8 (*)    RDW 16.6 (*)    Neutro Abs 9.6 (*)    Eosinophils Absolute 0.7 (*)    All other components within normal limits  COMPREHENSIVE METABOLIC PANEL - Abnormal; Notable for the following components:   Chloride 112 (*)    CO2 20 (*)    Glucose, Bld 109 (*)    BUN 37 (*)    Creatinine, Ser 2.06 (*)    Calcium 8.6 (*)    Albumin 3.0 (*)    AST 69 (*)    ALT 77 (*)    GFR, Estimated 25 (*)    All other components within normal limits  PROTIME-INR - Abnormal; Notable for the following components:   Prothrombin Time 19.4 (*)    INR 1.7 (*)    All other components within normal limits  TROPONIN I (HIGH SENSITIVITY) - Abnormal; Notable for the following components:   Troponin I (High Sensitivity) 1,105 (*)    All other components within normal limits  TROPONIN I (HIGH SENSITIVITY) - Abnormal; Notable for the following components:   Troponin I (High Sensitivity) 936 (*)     All other components within normal limits  CBG MONITORING, ED    EKG None  Radiology No results found.  Procedures .Critical Care  Performed by: Paris Lore, PA-C Authorized by: Paris Lore, PA-C   Critical  care provider statement:    Critical care time (minutes):  45   Critical care was time spent personally by me on the following activities:  Development of treatment plan with patient or surrogate, discussions with consultants, evaluation of patient's response to treatment, examination of patient, obtaining history from patient or surrogate, ordering and performing treatments and interventions, ordering and review of laboratory studies, ordering and review of radiographic studies, pulse oximetry and re-evaluation of patient's condition    Medications Ordered in ED Medications  lactated ringers bolus 1,000 mL (0 mLs Intravenous Stopped 03/10/22 0133)    ED Course/ Medical Decision Making/ A&P Clinical Course as of 03/10/22 0210  Fri Mar 10, 2022  0004 Consult to cardiology fellow Dr. Hetty Ely who will come see the patient the bedside but favors that changes are all likely related to ablation yesterday including elevation in troponin and bradycardia which he states will improve with colchicine which was prescribed at time of discharge yesterday.  I appreciate his collaboration in the care of this patient. [RS]  0136 Consult to cardiology fellow, Dr.Cosiano who states that patient's presentation with bradycardia and elevated troponins in the context of 24 hours status post cardiac ablation for atrial fibrillation are normal presentations.  Given the patient is asymptomatic, well-appearing with normal vital signs aside from the bradycardia, [RS]  0137  he does not feel she requires any further admission to the hospital.  He will contact her primary cardiologist to establish close outpatient follow-up for the patient, but does not feel she requires any further workup  from a Cardiologic standpoint.  I appreciate his collaboration of care with patient.  He does state that even if her second troponin is rising he does not feel to be Cardiologic indication for admission.  I appreciate his collaboration in the care of this patient. [RS]    Clinical Course User Index [RS] Ernie Sagrero, Eugene Gavia, PA-C                           Medical Decision Making 76 year old female with history of atrial fibrillation underwent an ablation yesterday presents with episodes of weakness and generalized fatigue as well as shortness of breath with exertion today.  Bradycardic to the 40s, vital signs normal.  Cardiopulmonary exam with sinus bradycardia, abdominal exam unremarkable, peripheral vascular status is reassuring.  The differential diagnosis of weakness includes but is not limited to: Neurologic causes: GBS, myasthenia gravis, CVA, MS, ALS, transverse myelitis, spinal cord injury, CVA, botulism Other causes: ACS, Arrhythmia, syncope, orthostatic hypotension, sepsis, hypoglycemia, electrolyte disturbance, hypothyroidism, respiratory failure, symptomatic anemia, dehydration, heat injury, polypharmacy, malignancy.     Amount and/or Complexity of Data Reviewed Labs: ordered.    Details: CBC with mild leukocytosis of 12.2, anemia with hemoglobin of 8.6 down from 9.23 weeks ago.  CMP with AKI with creatinine of 2.06, baseline near 1.5.  Additionally his transaminases AST/ALT 69/77.Troponin elevated to 1105, downtrending to 936 on repeat eval.  INR elevated to 1.7. ECG/medicine tests:     Details: EKG with bradycardia no STEMI     Consultation to cardiologist as above.  Clinically, workup consistent with patient who is 24 hours status post cardiac ablation for A-fib.  Given she is asymptomatic and has history of junctional bradycardia in the past, cardiology does not feel there is indication for admission at this time.  Clinical concern for emergent underlying etiology or  further ED workup or inpatient management is exceedingly low.  Regarding patient's  mild AKI was administered fluid bolus in the ED and has urinated.   Laurie James and her family  voiced understanding of her medical evaluation and treatment plan. Each of their questions answered to their expressed satisfaction.  Return precautions were given.  Patient is well-appearing, stable, and was discharged in good condition.  This chart was dictated using voice recognition software, Dragon. Despite the best efforts of this provider to proofread and correct errors, errors may still occur which can change documentation meaning.     Final Clinical Impression(s) / ED Diagnoses Final diagnoses:  Bradycardia    Rx / DC Orders ED Discharge Orders     None         Paris Lore, PA-C 03/10/22 0210    Gilda Crease, MD 03/10/22 [phone]

## 2022-03-10 ENCOUNTER — Encounter (HOSPITAL_COMMUNITY): Payer: Self-pay

## 2022-03-10 LAB — TROPONIN I (HIGH SENSITIVITY): Troponin I (High Sensitivity): 936 ng/L (ref <18)

## 2022-03-10 MED ORDER — SODIUM CHLORIDE 0.9 % IV BOLUS: 500.0000 mL | Freq: Once | INTRAVENOUS | Status: DC

## 2022-03-10 MED ORDER — LACTATED RINGERS IV BOLUS
1000.0000 mL | Freq: Once | INTRAVENOUS | Status: AC
  Administered 2022-03-10: 1000 mL via INTRAVENOUS

## 2022-03-10 NOTE — Consult Note (Signed)
Cardiology Consultation   Patient ID: Laurie James MRN: [id number]; DOB: 07-06-1945  Admit date: 03/09/2022 Date of Consult: 03/10/2022  PCP:  Georgann Housekeeper, MD   [facility] Providers Cardiologist:  Charlton Haws, MD  Electrophysiologist:  Maurice Small, MD       Patient Profile:   Laurie James is a 76 y.o. female with a hx of COPD, nonobstructive CAD, paroxysmal A-fib, and heart failure with reduced ejection fraction who is being seen 03/10/2022 for the evaluation of bradycardia at the request of ED.  History of Present Illness:   Laurie James has a history of paroxysmal A-fib complicated by heart failure with reduced ejection fraction thought secondary to tachyarrhythmias.  Was managed with amiodarone but this was discontinued in the setting of severe obstructive pulmonary disease.  Given her reduced ejection fraction and A-fib she underwent pulmonary vein isolation yesterday with successful result and no complication.  Today, her husband noticed that she was more lethargic and not responsive and therefore called EMS.  In speaking with Laurie James, she states that she was sleeping and remembered hearing her husband call out for her but that she just wanted to sleep and she was ignoring him.  She denies ever losing consciousness or having syncope since her procedure.  Of note, she has a history of sinus bradycardia and junctional rhythm in the past that have been asymptomatic.  She very much denies any worsening chest pain, shortness of breath, syncope or presyncope, palpitations since her ablation.  Past Medical History:  Diagnosis Date   AMD (age related macular degeneration)    COPD (chronic obstructive pulmonary disease) (HCC)    Depression    Hiatal hernia    Intermittent low back pain    Memory impairment    Midsternal chest pain    a. 12/2011 Cardiac CTA Ca++ score of 103.3 (80th %), LAD <50p/m, RCA 50-75.   Osteoporosis    PAF  (paroxysmal atrial fibrillation) (HCC) 09/2020   Vitamin B 12 deficiency    Past Surgical History:  Procedure Laterality Date   ABDOMINAL HYSTERECTOMY     ATRIAL FIBRILLATION ABLATION N/A 03/08/2022   Procedure: ATRIAL FIBRILLATION ABLATION;  Surgeon: Maurice Small, MD;  Location: [facility] INVASIVE CV LAB;  Service: Cardiovascular;  Laterality: N/A;   IR THORACENTESIS ASP PLEURAL SPACE W/IMG GUIDE  10/20/2020   RIGHT/LEFT HEART CATH AND CORONARY ANGIOGRAPHY N/A 10/07/2020   Procedure: RIGHT/LEFT HEART CATH AND CORONARY ANGIOGRAPHY;  Surgeon: Runell Gess, MD;  Location: [facility] INVASIVE CV LAB;  Service: Cardiovascular;  Laterality: N/A;   TEE WITHOUT CARDIOVERSION N/A 10/25/2020   Procedure: TRANSESOPHAGEAL ECHOCARDIOGRAM (TEE);  Surgeon: Quintella Reichert, MD;  Location: [facility] ENDOSCOPY;  Service: Cardiovascular;  Laterality: N/A;     Home Medications:  Prior to Admission medications   Medication Sig Start Date End Date Taking? Authorizing Provider  albuterol (VENTOLIN HFA) 108 (90 Base) MCG/ACT inhaler Inhale 2 puffs into the lungs every 4 (four) hours as needed for wheezing. 06/08/20   [provider]  amiodarone (PACERONE) 200 MG tablet Take 1 tablet (200 mg total) by mouth 2 (two) times daily. 12/26/21   Bensimhon, Bevelyn Buckles, MD  apixaban (ELIQUIS) 2.5 MG TABS tablet Take 1 tablet (2.5 mg total) by mouth 2 (two) times daily. 01/17/22   Bensimhon, Bevelyn Buckles, MD  atorvastatin (LIPITOR) 40 MG tablet Take 1 tablet by mouth once daily 07/26/21   Wendall Stade, MD  colchicine 0.6 MG tablet Take  1 tablet (0.6 mg total) by mouth 2 (two) times daily for 5 days. 03/08/22 03/13/22  Graciella Freer, PA-C  cyanocobalamin (,VITAMIN B-12,) 1000 MCG/ML injection Inject 1,000 mcg into the muscle every 30 (thirty) days. 06/11/16   [provider]  furosemide (LASIX) 20 MG tablet Take 1 tablet (20 mg total) by mouth as needed. Leg edema 12/30/21   Jacklynn Ganong, FNP  metoprolol tartrate  (LOPRESSOR) 50 MG tablet Take 1 tablet (50 mg total) by mouth Once PRN for up to 1 dose (Take one tablet two hours prior to CT scan if heart rate is greater than 80). 01/31/22   Mealor, Roberts Gaudy, MD  nitroGLYCERIN (NITROSTAT) 0.4 MG SL tablet Place 1 tablet (0.4 mg total) under the tongue every 5 (five) minutes x 3 doses as needed for chest pain. 10/11/20   Filbert Schilder, NP  ondansetron (ZOFRAN) 4 MG tablet Take 4 mg by mouth 3 (three) times daily as needed for nausea or vomiting. 11/22/20   [provider]  pantoprazole (PROTONIX) 40 MG tablet Take 1 tablet (40 mg total) by mouth 2 (two) times daily. 08/23/21   Bensimhon, Bevelyn Buckles, MD    Inpatient Medications: Scheduled Meds:  Continuous Infusions:  lactated ringers     PRN Meds:   Allergies:    Allergies  Allergen Reactions   Codeine Nausea And Vomiting and Other (See Comments)    Upsets the stomach    Social History:   Social History   Socioeconomic History   Marital status: Married    Spouse name: Not on file   Number of children: Not on file   Years of education: Not on file   Highest education level: Not on file  Occupational History   Occupation: retired  Tobacco Use   Smoking status: Former    Packs/day: 1.00    Years: 65.00    Total pack years: 65.00    Types: Cigarettes    Quit date: 09/2020    Years since quitting: 1.5   Smokeless tobacco: Never  Vaping Use   Vaping Use: Never used  Substance and Sexual Activity   Alcohol use: No    Alcohol/week: 0.0 standard drinks of alcohol    Comment: rarely   Drug use: No   Sexual activity: Not on file  Other Topics Concern   Not on file  Social History Narrative   Not on file   Social Determinants of Health   Financial Resource Strain: Not on file  Food Insecurity: Not on file  Transportation Needs: No Transportation Needs (10/28/2020)   PRAPARE - Administrator, Civil Service (Medical): No    Lack of Transportation (Non-Medical): No   Physical Activity: Not on file  Stress: Not on file  Social Connections: Not on file  Intimate Partner Violence: Not on file    Family History:    Family History  Problem Relation Age of Onset   Breast cancer Sister    Breast cancer Sister      ROS:  Please see the history of present illness.  All other ROS reviewed and negative.     Physical Exam/Data:   Vitals:   03/09/22 2158 03/09/22 2159 03/09/22 2245 03/09/22 2300  BP:   115/72 (!) 111/58  Pulse: (!) 49  (!) 49 (!) 44  Resp: 18  (!) 22 17  Temp: (!) 97.3 F (36.3 C)     TempSrc: Oral     SpO2: 99% 99% 100% 98%  Weight:      Height:       No intake or output data in the 24 hours ending 03/10/22 0016    03/09/2022    9:55 PM 03/08/2022    6:46 AM 01/31/2022    2:10 PM  Last 3 Weights  Weight (lbs) 110 lb 3.7 oz 111 lb 115 lb  Weight (kg) 50 kg 50.349 kg 52.164 kg     Body mass index is 24.71 kg/m.  General:  Well nourished, well developed, in no acute distress HEENT: normal Neck: no JVD Vascular: No carotid bruits; Distal pulses 2+ bilaterally Cardiac:  normal S1, S2; regular rhythm, bradycardic; no murmur  Lungs:  clear to auscultation bilaterally, no wheezing, rhonchi or rales  Abd: soft, nontender, no hepatomegaly  Ext: no edema Musculoskeletal:  No deformities, BUE and BLE strength normal and equal Skin: warm and dry  Neuro:  CNs 2-12 intact, no focal abnormalities noted Psych:  Normal affect   EKG:  The EKG was personally reviewed and demonstrates: Ectopic atrial rhythm with wandering pacemaker with bradycardia Telemetry:  Telemetry was personally reviewed and demonstrates: Remittent times of sinus bradycardia as well as ectopic atrial rhythm.  Possible junctional rhythm.  Relevant CV Studies: - Echo (8/23): EF 40-45%, grade II DD, RV mildly reduced, moderate TR   - Echo (6/23): EF 45-50%, RV ok, severe TR   - Echo (10/06/20): EF 55-60 %    - LHC (10/07/2020) showed mild 40% segmental RCA  stenosis as well as 50 to 60% second diagonal branch stenosis   RHC 6/22 RA 3 PA 17/2 (8) PCWP 7 PVR  0.25 WU Fick 2.6/2.0 (LVEF was low)   - TEE (7/22): EF 20% RV moderately down RA massively dilated. Heavy smoke in RA/LA and aortic root.    EKG:  Last EKG results: today - suspect sinus rhythm with low atrial amplitude. Artifact precludes interpretation.   Laboratory Data:  High Sensitivity Troponin:   Recent Labs  Lab 03/09/22 2240  TROPONINIHS 1,105*     Chemistry Recent Labs  Lab 03/09/22 2240  NA 139  K 4.5  CL 112*  CO2 20*  GLUCOSE 109*  BUN 37*  CREATININE 2.06*  CALCIUM 8.6*  GFRNONAA 25*  ANIONGAP 7    Recent Labs  Lab 03/09/22 2240  PROT 6.6  ALBUMIN 3.0*  AST 69*  ALT 77*  ALKPHOS 73  BILITOT 0.4   Lipids No results for input(s): "CHOL", "TRIG", "HDL", "LABVLDL", "LDLCALC", "CHOLHDL" in the last 168 hours.  Hematology Recent Labs  Lab 03/09/22 2240  WBC 12.2*  RBC 2.72*  HGB 8.6*  HCT 26.8*  MCV 98.5  MCH 31.6  MCHC 32.1  RDW 16.6*  PLT 215   Thyroid No results for input(s): "TSH", "FREET4" in the last 168 hours.  BNPNo results for input(s): "BNP", "PROBNP" in the last 168 hours.  DDimer No results for input(s): "DDIMER" in the last 168 hours.   Radiology/Studies:  No results found.   Assessment and Plan:   Sinus bradycardia with intermittent ectopic atrial rhythm  Patient with a history of sinus bradycardia and intermittent junctional arrhythmia who presents postop day 1 status post pulmonary vein isolation found to have bradycardia with intermittent sinus bradycardia and ectopic atrial rhythm likely in the setting of atrial inflammation from her ablation.  Reassuringly, she has no signs of chronotropic incompetence or syncope.  From a rate and rhythm standpoint her blood pressure stable and she is asymptomatic and is therefore safe for  discharge home.  Would advise holding her home metoprolol until she follows up with EP as an  outpatient.  2.   Myocardial injury secondary to ablation Troponin elevation most consistent with myocardial injury secondary ablation given the absence of chest pain or ischemic EKG changes.  Risk Assessment/Risk Scores:         [facility] will sign off.   Medication Recommendations: Hold metoprolol until following up with outpatient cardiologist Other recommendations (labs, testing, etc):   Follow up as an outpatient:  Dr. Nelly Laurence  For questions or updates, please contact [facility] Please consult [email or web address] for contact info under    Signed, Thana Ates, MD  03/10/2022 12:16 AM

## 2022-03-10 NOTE — Discharge Instructions (Signed)
You are seen in the ER today for your shortness of breath with exertion and fatigue.  While there were some new changes with your EKG and your cardiac blood work, the cardiologist feels it is most likely related to the ablation you underwent yesterday.  Given you are feeling well and you have close outpatient follow-up there is no further workup warranted near this time.  There is no indication for you to be admitted to the hospital.  Please continue to take the colchicine that was prescribed to at the time of your ablation and follow-up closely with your cardiologist.  Return to ER with any severe symptoms.

## 2022-03-13 ENCOUNTER — Telehealth: Payer: Self-pay | Admitting: Cardiovascular Disease

## 2022-03-13 NOTE — Telephone Encounter (Signed)
Spoke with the patient who reports nausea, vomiting and diarrhea since her ablation. She has not taken any zofran which I have advised her to do. She did just complete her course of colchicine. Advised that symptoms could possibly be a side effect from the medicine and should hopefully resolve now that she is no longer taking. She will call back if no improvement.

## 2022-03-13 NOTE — Telephone Encounter (Signed)
Pt states that since having Ablation last week she has been vomiting as well as having diarrhea. She would like a callback regarding this matter. Please advise

## 2022-03-30 ENCOUNTER — Encounter (HOSPITAL_COMMUNITY): Payer: Self-pay

## 2022-03-30 ENCOUNTER — Emergency Department (HOSPITAL_COMMUNITY)
Admission: EM | Admit: 2022-03-30 | Discharge: 2022-03-31 | Disposition: A | Discharge status: Home / Self Care | Payer: Medicare Other | Attending: Emergency Medicine | Admitting: Emergency Medicine

## 2022-03-30 ENCOUNTER — Other Ambulatory Visit: Payer: Self-pay

## 2022-03-30 ENCOUNTER — Emergency Department (HOSPITAL_COMMUNITY): Payer: Medicare Other

## 2022-03-30 DIAGNOSIS — I480 Paroxysmal atrial fibrillation: Secondary | ICD-10-CM | POA: Diagnosis not present

## 2022-03-30 DIAGNOSIS — R109 Unspecified abdominal pain: Secondary | ICD-10-CM | POA: Diagnosis not present

## 2022-03-30 DIAGNOSIS — R1084 Generalized abdominal pain: Secondary | ICD-10-CM | POA: Insufficient documentation

## 2022-03-30 DIAGNOSIS — R042 Hemoptysis: Secondary | ICD-10-CM | POA: Insufficient documentation

## 2022-03-30 DIAGNOSIS — R918 Other nonspecific abnormal finding of lung field: Secondary | ICD-10-CM | POA: Diagnosis not present

## 2022-03-30 DIAGNOSIS — Z79899 Other long term (current) drug therapy: Secondary | ICD-10-CM | POA: Insufficient documentation

## 2022-03-30 DIAGNOSIS — J90 Pleural effusion, not elsewhere classified: Secondary | ICD-10-CM | POA: Diagnosis not present

## 2022-03-30 DIAGNOSIS — J439 Emphysema, unspecified: Secondary | ICD-10-CM | POA: Diagnosis not present

## 2022-03-30 DIAGNOSIS — Z7901 Long term (current) use of anticoagulants: Secondary | ICD-10-CM | POA: Diagnosis not present

## 2022-03-30 DIAGNOSIS — Z743 Need for continuous supervision: Secondary | ICD-10-CM | POA: Diagnosis not present

## 2022-03-30 DIAGNOSIS — R58 Hemorrhage, not elsewhere classified: Secondary | ICD-10-CM | POA: Diagnosis not present

## 2022-03-30 LAB — PROTIME-INR
INR: 1.7 — ABNORMAL HIGH (ref 0.8–1.2)
Prothrombin Time: 19.9 seconds — ABNORMAL HIGH (ref 11.4–15.2)

## 2022-03-30 LAB — BASIC METABOLIC PANEL
Anion gap: 8 (ref 5–15)
BUN: 24 mg/dL — ABNORMAL HIGH (ref 8–23)
CO2: 23 mmol/L (ref 22–32)
Calcium: 8.8 mg/dL — ABNORMAL LOW (ref 8.9–10.3)
Chloride: 105 mmol/L (ref 98–111)
Creatinine, Ser: 1.29 mg/dL — ABNORMAL HIGH (ref 0.44–1.00)
GFR, Estimated: 43 mL/min — ABNORMAL LOW (ref >60)
Glucose, Bld: 103 mg/dL — ABNORMAL HIGH (ref 70–99)
Potassium: 4.1 mmol/L (ref 3.5–5.1)
Sodium: 136 mmol/L (ref 135–145)

## 2022-03-30 LAB — CBC WITH DIFFERENTIAL/PLATELET
Abs Immature Granulocytes: 0.03 10*3/uL (ref 0.00–0.07)
Basophils Absolute: 0 10*3/uL (ref 0.0–0.1)
Basophils Relative: 0 %
Eosinophils Absolute: 1.1 10*3/uL — ABNORMAL HIGH (ref 0.0–0.5)
Eosinophils Relative: 15 %
HCT: 28.6 % — ABNORMAL LOW (ref 36.0–46.0)
Hemoglobin: 8.9 g/dL — ABNORMAL LOW (ref 12.0–15.0)
Immature Granulocytes: 0 %
Lymphocytes Relative: 12 %
Lymphs Abs: 0.9 10*3/uL (ref 0.7–4.0)
MCH: 29.8 pg (ref 26.0–34.0)
MCHC: 31.1 g/dL (ref 30.0–36.0)
MCV: 95.7 fL (ref 80.0–100.0)
Monocytes Absolute: 0.4 10*3/uL (ref 0.1–1.0)
Monocytes Relative: 6 %
Neutro Abs: 5.1 10*3/uL (ref 1.7–7.7)
Neutrophils Relative %: 67 %
Platelets: 294 10*3/uL (ref 150–400)
RBC: 2.99 MIL/uL — ABNORMAL LOW (ref 3.87–5.11)
RDW: 15.7 % — ABNORMAL HIGH (ref 11.5–15.5)
WBC: 7.6 10*3/uL (ref 4.0–10.5)
nRBC: 0 % (ref 0.0–0.2)

## 2022-03-30 LAB — APTT: aPTT: 38 seconds — ABNORMAL HIGH (ref 24–36)

## 2022-03-30 NOTE — ED Notes (Addendum)
Pt sitting with family at bedside. Pt states she has coughed up a small amount of blood x2 since she has been here which is a decrease in the amount that she was coughing up at home. Spoke to CT and was told pt was next on CT list. Pt updated.

## 2022-03-30 NOTE — ED Provider Triage Note (Signed)
Emergency Medicine Provider Triage Evaluation Note  Wilhelmenia Addis [name] , a 76 y.o. female  was evaluated in triage.  Pt complains of hemoptysis which began approximately 2 hours prior to arrival.  Patient takes Eliquis for persistent atrial fibrillation.  She states she had an ablation at the beginning of this month for the same.  She denies any recent illness, chest pain, shortness of breath, abdominal pain, nausea, vomiting.  Review of Systems  Positive: As above Negative: As above  Physical Exam  Ht 4\' 8"  (1.422 m)   Wt 49.9 kg   BMI 24.66 kg/m  Gen:   Awake, no distress   Resp:  Normal effort  MSK:   Moves extremities without difficulty  Other:    Medical Decision Making  Medically screening exam initiated at 5:36 PM.  Appropriate orders placed.  Dorisann Schwanke Zenovia Jarred Detore was informed that the remainder of the evaluation will be completed by another provider, this initial triage assessment does not replace that evaluation, and the importance of remaining in the ED until their evaluation is complete.  EMS reported a significant other blood in the patient's trash can and pink-tinged sputum noted during transport   Annie Paras, Darrick Grinder 03/30/22 1738

## 2022-03-30 NOTE — ED Triage Notes (Signed)
Pt BIB EMS for 2 episodes of hemoptysis.Pt does take eliquis. Pt had an ablation for a.fib about a month ago. Pt is has some vision impairment.

## 2022-03-31 ENCOUNTER — Emergency Department (HOSPITAL_COMMUNITY): Payer: Medicare Other

## 2022-03-31 DIAGNOSIS — R109 Unspecified abdominal pain: Secondary | ICD-10-CM | POA: Diagnosis not present

## 2022-03-31 LAB — POC OCCULT BLOOD, ED: Fecal Occult Bld: NEGATIVE

## 2022-03-31 MED ORDER — IOHEXOL 350 MG/ML SOLN
75.0000 mL | Freq: Once | INTRAVENOUS | Status: AC | PRN
  Administered 2022-03-31: 75 mL via INTRAVENOUS

## 2022-03-31 NOTE — Discharge Instructions (Signed)
Please keep your appointment with your cardiologist for Tuesday.  I would like for you to please return to the emergency department for any worsening symptoms including if your hemoptysis returns and is consistent.

## 2022-03-31 NOTE — ED Provider Notes (Signed)
[facility] EMERGENCY DEPARTMENT Provider Note   CSN: [id number] Arrival date & time: 03/30/22  1726     History Chief Complaint  Patient presents with   Hemoptysis    Laurie James is a 76 y.o. female patient with history of paroxysmal atrial fibrillation on Eliquis and COPD who presents to the emergency department today for further evaluation of hemoptysis.  This started yesterday.  She states that she had an atrial fibrillation ablation roughly a month ago.  Patient has been in the emergency department for almost 18 hours.  She has had 2 small episodes of hemoptysis today.  She states that she does not cough but she feels in her throat spits up the blood.  She is also having some abdominal pain generally.  She denies any chest pain, shortness of breath, fever, chills.  She has not missed any doses of her Eliquis.  HPI     Home Medications Prior to Admission medications   Medication Sig Start Date End Date Taking? Authorizing Provider  albuterol (VENTOLIN HFA) 108 (90 Base) MCG/ACT inhaler Inhale 2 puffs into the lungs every 4 (four) hours as needed for wheezing. 06/08/20   [provider]  amiodarone (PACERONE) 200 MG tablet Take 1 tablet (200 mg total) by mouth 2 (two) times daily. 12/26/21   Bensimhon, Bevelyn Buckles, MD  apixaban (ELIQUIS) 2.5 MG TABS tablet Take 1 tablet (2.5 mg total) by mouth 2 (two) times daily. 01/17/22   Bensimhon, Bevelyn Buckles, MD  atorvastatin (LIPITOR) 40 MG tablet Take 1 tablet by mouth once daily 07/26/21   Wendall Stade, MD  colchicine 0.6 MG tablet Take 1 tablet (0.6 mg total) by mouth 2 (two) times daily for 5 days. 03/08/22 03/13/22  Graciella Freer, PA-C  cyanocobalamin (,VITAMIN B-12,) 1000 MCG/ML injection Inject 1,000 mcg into the muscle every 30 (thirty) days. 06/11/16   [provider]  furosemide (LASIX) 20 MG tablet Take 1 tablet (20 mg total) by mouth as needed. Leg edema 12/30/21   Jacklynn Ganong, FNP  metoprolol tartrate (LOPRESSOR) 50 MG tablet Take 1 tablet (50 mg total) by mouth Once PRN for up to 1 dose (Take one tablet two hours prior to CT scan if heart rate is greater than 80). 01/31/22   Mealor, Roberts Gaudy, MD  nitroGLYCERIN (NITROSTAT) 0.4 MG SL tablet Place 1 tablet (0.4 mg total) under the tongue every 5 (five) minutes x 3 doses as needed for chest pain. 10/11/20   Filbert Schilder, NP  ondansetron (ZOFRAN) 4 MG tablet Take 4 mg by mouth 3 (three) times daily as needed for nausea or vomiting. 11/22/20   [provider]  pantoprazole (PROTONIX) 40 MG tablet Take 1 tablet (40 mg total) by mouth 2 (two) times daily. 08/23/21   Bensimhon, Bevelyn Buckles, MD      Allergies    Codeine    Review of Systems   Review of Systems  All other systems reviewed and are negative.   Physical Exam Updated Vital Signs BP (!) 148/74 (BP Location: Right Arm)   Pulse 64   Temp (!) 97.5 F (36.4 C) (Oral)   Resp 17   Ht 4\' 8"  (1.422 m)   Wt 49.9 kg   SpO2 100%   BMI 24.66 kg/m  Physical Exam Vitals and nursing note reviewed.  Constitutional:      General: She is not in acute distress.    Appearance: Normal appearance.  HENT:  Head: Normocephalic and atraumatic.  Eyes:     General:        Right eye: No discharge.        Left eye: No discharge.  Cardiovascular:     Comments: Regular rate and rhythm.  S1/S2 are distinct without any evidence of murmur, rubs, or gallops.  Radial pulses are 2+ bilaterally.  Dorsalis pedis pulses are 2+ bilaterally.  No evidence of pedal edema. Pulmonary:     Comments: Clear to auscultation bilaterally.  Normal effort.  No respiratory distress.  No evidence of wheezes, rales, or rhonchi heard throughout. Abdominal:     General: Abdomen is flat. Bowel sounds are normal. There is no distension.     Tenderness: There is no guarding or rebound.     Comments: Mild generalized abdominal tenderness.  Genitourinary:    Comments: Rectal exam revealed  good rectal tone.  No obvious external or internal hemorrhoids.  No evidence of anal fissure.  There was brown stool in the rectal vault. Musculoskeletal:        General: Normal range of motion.     Cervical back: Neck supple.  Skin:    General: Skin is warm and dry.     Findings: No rash.  Neurological:     General: No focal deficit present.     Mental Status: She is alert.  Psychiatric:        Mood and Affect: Mood normal.        Behavior: Behavior normal.     ED Results / Procedures / Treatments   Labs (all labs ordered are listed, but only abnormal results are displayed) Labs Reviewed  CBC WITH DIFFERENTIAL/PLATELET - Abnormal; Notable for the following components:      Result Value   RBC 2.99 (*)    Hemoglobin 8.9 (*)    HCT 28.6 (*)    RDW 15.7 (*)    Eosinophils Absolute 1.1 (*)    All other components within normal limits  PROTIME-INR - Abnormal; Notable for the following components:   Prothrombin Time 19.9 (*)    INR 1.7 (*)    All other components within normal limits  APTT - Abnormal; Notable for the following components:   aPTT 38 (*)    All other components within normal limits  BASIC METABOLIC PANEL - Abnormal; Notable for the following components:   Glucose, Bld 103 (*)    BUN 24 (*)    Creatinine, Ser 1.29 (*)    Calcium 8.8 (*)    GFR, Estimated 43 (*)    All other components within normal limits  I-STAT CHEM 8, ED  POC OCCULT BLOOD, ED    EKG None  Radiology CT ABDOMEN PELVIS W CONTRAST  Result Date: 03/31/2022 CLINICAL DATA:  Abdominal pain EXAM: CT ABDOMEN AND PELVIS WITH CONTRAST TECHNIQUE: Multidetector CT imaging of the abdomen and pelvis was performed using the standard protocol following bolus administration of intravenous contrast. RADIATION DOSE REDUCTION: This exam was performed according to the departmental dose-optimization program which includes automated exposure control, adjustment of the mA and/or kV according to patient size  and/or use of iterative reconstruction technique. CONTRAST:  12mL OMNIPAQUE IOHEXOL 350 MG/ML SOLN COMPARISON:  05/20/2021 FINDINGS: Lower chest: There is minimal right pleural effusion with significant interval decrease. Centrilobular and panlobular emphysema is seen. Honeycombing is seen in the periphery of both lungs suggesting chronic interstitial lung disease with scarring. Heart is enlarged in size. There is reflux of contrast into hepatic veins suggesting right  heart dysfunction. Hepatobiliary: There is no significant dilation of bile ducts. There is increased density in the lumen of gallbladder. There is no wall thickening. There is no fluid around the gallbladder. Pancreas: There is slight prominence of pancreatic duct. No focal abnormalities are seen. Spleen: Unremarkable. Adrenals/Urinary Tract: Left adrenal is slightly larger than right. There is no hydronephrosis. Right renal pelvis is more prominent than the left. Similar finding was seen in the previous study. Possible tiny renal cysts are seen. There is cortical thinning. There are no demonstrable renal or ureteral stones. Urinary bladder is unremarkable. Stomach/Bowel: Small hiatal hernia is seen. There is mild symmetric wall thickening in antrum of the stomach, possibly due to incomplete distention. Small bowel loops are not dilated. Appendix is difficult to visualize. In image 33 of series 6, there is a small caliber tubular structure in pericecal region. There is mild diffuse wall thickening in cecum and ascending colon. There is incomplete distention of sigmoid. There is no pericolic stranding or fluid collection. Vascular/Lymphatic: Extensive atherosclerotic plaques and calcifications are seen in the aorta and its major branches. No new significant lymphadenopathy is seen. Reproductive: Uterus is not seen.  There are no adnexal masses. Other: There is no ascites or pneumoperitoneum. Musculoskeletal: Degenerative changes are noted in lumbar  spine. Dextroscoliosis is seen in the lumbar spine. IMPRESSION: There is no evidence intestinal obstruction or pneumoperitoneum. There is no hydronephrosis. There is mild diffuse wall thickening in cecum and ascending colon suggesting possible nonspecific colitis. There is no pericolic stranding or fluid collection. Minimal right pleural effusion. Chronic interstitial lung disease with scarring. Small hiatal hernia. There is increased attenuation in the lumen of gallbladder suggesting presence of sludge or tiny stones. Other findings as described in the body of the report. Electronically Signed   By: Ernie Avena M.D.   On: 03/31/2022 12:33   CT CHEST WO CONTRAST  Result Date: 03/30/2022 CLINICAL DATA:  Hemoptysis EXAM: CT CHEST WITHOUT CONTRAST TECHNIQUE: Multidetector CT imaging of the chest was performed following the standard protocol without IV contrast. RADIATION DOSE REDUCTION: This exam was performed according to the departmental dose-optimization program which includes automated exposure control, adjustment of the mA and/or kV according to patient size and/or use of iterative reconstruction technique. COMPARISON:  CT 03/01/2022, chest x-ray 11/29/2018. CT 10/20/2020, 10/09/2016 FINDINGS: Cardiovascular: Limited evaluation without intravenous contrast. Moderate aortic atherosclerosis. No aneurysm. Coronary vascular calcification. Normal cardiac size. No pericardial effusion. Mediastinum/Nodes: Midline trachea. No thyroid mass. No suspicious lymph nodes. Esophagus within normal limits. Lungs/Pleura: Emphysema. Pleural and parenchymal scarring at the apices. Scattered bilateral slightly irregular pulmonary nodules are stable compared with multiple prior exams consistent with benign finding. Small right-sided pleural effusion. Upper Abdomen: No acute finding. Dense liver parenchyma may relate to history of amiodarone use. No acute finding. Musculoskeletal: No acute osseous abnormality. IMPRESSION:  1. Emphysema. Small right-sided pleural effusion. No acute airspace disease. 2. Apical pleural and parenchymal scarring with stable scattered pulmonary nodules compared to previous exams. No imaging follow-up recommended. . Aortic Atherosclerosis (ICD10-I70.0) and Emphysema (ICD10-J43.9). Electronically Signed   By: Jasmine Pang M.D.   On: 03/30/2022 21:34    Procedures Procedures    Medications Ordered in ED Medications  iohexol (OMNIPAQUE) 350 MG/ML injection 75 mL (75 mLs Intravenous Contrast Given 03/31/22 1213)    ED Course/ Medical Decision Making/ A&P Clinical Course as of 03/31/22 1314  Fri Mar 31, 2022  1112 CBC with Differential(!) Is evidence of anemia but in comparison to patient's baseline this  is normal. [CF]  1112 Basic metabolic panel(!) BMP reveals no electrolyte abnormalities.  There is an elevated creatinine but this seems to be at the patient's baseline. [CF]  1113 Coagulation studies are prolonged in the setting of Eliquis.  These are at the patient's baseline even improved from previous results. [CF]  1113 POC occult blood, ED Occult negative. [CF]  1256 I ordered CT scans of the chest and abdomen and pelvis.  There is no clinical signs of acute abdomen.  There is no evidence of pneumonia or other etiology to suggest a cause of her hemoptysis today. [CF]  1309 On reevaluation, patient is feeling better.  She is abdominal pain-free and has not had any hemoptysis since being back in the treatment room. [CF]    Clinical Course User Index [CF] Teressa Lower, PA-C                           Medical Decision Making Laurie James is a 76 y.o. female patient who presents to the emergency department today for further evaluation of hemoptysis.  Patient has been here a total of almost 20 hours.  She was seen and evaluated initially up in triage where she had a CT scan of the chest without contrast and lab work ordered.  Her coagulation studies were prolonged  in the setting of Eliquis.  CT chest did not show any signs of pneumonia.  Please see ED course for further review.  Patient was complaining of abdominal pain when she got back to the treatment room.  Given her age and tenderness I ultimately decided to get a CT scan of the abdomen.  There was no clinical signs of acute abdomen and CT scan did not reveal any significant signs of such.  As highlighted in ED course, on reevaluation, patient was feeling better and asking to be discharged to go home and eat.  This is reasonable.  She has not had any recurrent hemoptysis episodes.  She is going to follow-up with her cardiologist on Tuesday as she already has an appointment.  Strict return precautions were discussed.  All questions or concerns addressed with her and family at bedside.  She is safe for discharge at this time.   Amount and/or Complexity of Data Reviewed Labs:  Decision-making details documented in ED Course. Radiology: ordered.  Risk Prescription drug management. Risk Details: Patient does not meet inpatient criteria at this time.  She is overall well-appearing.  She will follow-up with her cardiologist and PCP in the outpatient setting.    Final Clinical Impression(s) / ED Diagnoses Final diagnoses:  Hemoptysis  Generalized abdominal pain    Rx / DC Orders ED Discharge Orders     None         Teressa Lower, [place] 03/31/22 1315    Cathren Laine, MD 04/05/22 1625

## 2022-04-04 ENCOUNTER — Ambulatory Visit (HOSPITAL_COMMUNITY)
Admission: RE | Admit: 2022-04-04 | Discharge: 2022-04-04 | Disposition: A | Discharge status: Home / Self Care | Payer: Medicare Other | Source: Ambulatory Visit | Attending: Internal Medicine | Admitting: Internal Medicine

## 2022-04-04 ENCOUNTER — Ambulatory Visit (HOSPITAL_BASED_OUTPATIENT_CLINIC_OR_DEPARTMENT_OTHER)
Admit: 2022-04-04 | Discharge: 2022-04-04 | Disposition: A | Discharge status: Home / Self Care | Payer: Medicare Other | Attending: Physician Assistant | Admitting: Physician Assistant

## 2022-04-04 ENCOUNTER — Encounter (HOSPITAL_COMMUNITY): Payer: Self-pay | Admitting: Physician Assistant

## 2022-04-04 VITALS — BP 134/80 | HR 72 | Ht <= 58 in | Wt 110.6 lb

## 2022-04-04 VITALS — BP 118/82 | HR 67 | Wt 110.0 lb

## 2022-04-04 DIAGNOSIS — Z6824 Body mass index (BMI) 24.0-24.9, adult: Secondary | ICD-10-CM | POA: Diagnosis not present

## 2022-04-04 DIAGNOSIS — N179 Acute kidney failure, unspecified: Secondary | ICD-10-CM

## 2022-04-04 DIAGNOSIS — I2510 Atherosclerotic heart disease of native coronary artery without angina pectoris: Secondary | ICD-10-CM | POA: Insufficient documentation

## 2022-04-04 DIAGNOSIS — I071 Rheumatic tricuspid insufficiency: Secondary | ICD-10-CM | POA: Diagnosis not present

## 2022-04-04 DIAGNOSIS — I428 Other cardiomyopathies: Secondary | ICD-10-CM | POA: Insufficient documentation

## 2022-04-04 DIAGNOSIS — I130 Hypertensive heart and chronic kidney disease with heart failure and stage 1 through stage 4 chronic kidney disease, or unspecified chronic kidney disease: Secondary | ICD-10-CM | POA: Diagnosis not present

## 2022-04-04 DIAGNOSIS — E46 Unspecified protein-calorie malnutrition: Secondary | ICD-10-CM | POA: Diagnosis not present

## 2022-04-04 DIAGNOSIS — R54 Age-related physical debility: Secondary | ICD-10-CM | POA: Insufficient documentation

## 2022-04-04 DIAGNOSIS — I480 Paroxysmal atrial fibrillation: Secondary | ICD-10-CM | POA: Diagnosis not present

## 2022-04-04 DIAGNOSIS — Z7901 Long term (current) use of anticoagulants: Secondary | ICD-10-CM | POA: Diagnosis not present

## 2022-04-04 DIAGNOSIS — D6869 Other thrombophilia: Secondary | ICD-10-CM

## 2022-04-04 DIAGNOSIS — J449 Chronic obstructive pulmonary disease, unspecified: Secondary | ICD-10-CM | POA: Insufficient documentation

## 2022-04-04 DIAGNOSIS — I5022 Chronic systolic (congestive) heart failure: Secondary | ICD-10-CM | POA: Diagnosis not present

## 2022-04-04 DIAGNOSIS — Z79899 Other long term (current) drug therapy: Secondary | ICD-10-CM | POA: Diagnosis not present

## 2022-04-04 DIAGNOSIS — N1832 Chronic kidney disease, stage 3b: Secondary | ICD-10-CM | POA: Insufficient documentation

## 2022-04-04 NOTE — Progress Notes (Signed)
Primary Care Physician: Georgann Housekeeper, MD Primary Cardiologist: Dr Eden Emms  Primary Electrophysiologist: Dr Nelly Laurence Referring Physician: Dr Nelly Laurence [facility]: Dr Excell Seltzer Laurie James is a 76 y.o. female with a history of COPD, systolic CHF, HTN, aortic atherosclerosis, atrial fibrillation who presents for follow up in the [facility]. Atrial fibrillation was first diagnosed in June 2022 and managed with amiodarone. Amiodarone was discontinued due to severe COPD. A-fib recurred in May 2023 but was well rate controlled at that time. She had recurrence off of amiodarone, admitted with A-fib and RVR with rates up to 150s, CHF. Amiodarone was restarted, but she was thought not to be a candidate to to potential pulmonary toxicity. Patient is on Eliquis for a CHADS2VASC score of 6. She was evaluated by Dr Nelly Laurence and underwent afib ablation 03/08/22. She was seen at the ED 03/09/22 with lethargy. By the time EMS got her to the hospital she was at her baseline. She was also seen at the ED 03/31/22 for two small episodes of hemoptysis. Chest CT was negative for any acute process.   On follow up today, patient reports that she is doing well today. Her hemoptysis has resolved. She denies any fever, malaise, or chest pain. No groin issues. She remains in SR.   Today, she denies symptoms of palpitations, chest pain, shortness of breath, orthopnea, PND, lower extremity edema, dizziness, presyncope, syncope, snoring, daytime somnolence, bleeding, or neurologic sequela. The patient is tolerating medications without difficulties and is otherwise without complaint today.    Atrial Fibrillation Risk Factors:  she does not have symptoms or diagnosis of sleep apnea. she does not have a history of rheumatic fever.   she has a BMI of Body mass index is 24.8 kg/m.Marland Kitchen Filed Weights   04/04/22 1315  Weight: 50.2 kg    Family History  Problem Relation Age of Onset    Breast cancer Sister    Breast cancer Sister      Atrial Fibrillation Management history:  Previous antiarrhythmic drugs: amiodarone  Previous cardioversions: none Previous ablations: 03/08/22 CHADS2VASC score: 6 Anticoagulation history: Eliquis   Past Medical History:  Diagnosis Date   AMD (age related macular degeneration)    COPD (chronic obstructive pulmonary disease) (HCC)    Depression    Hiatal hernia    Intermittent low back pain    Memory impairment    Midsternal chest pain    a. 12/2011 Cardiac CTA Ca++ score of 103.3 (80th %), LAD <50p/m, RCA 50-75.   Osteoporosis    PAF (paroxysmal atrial fibrillation) (HCC) 09/2020   Vitamin B 12 deficiency    Past Surgical History:  Procedure Laterality Date   ABDOMINAL HYSTERECTOMY     ATRIAL FIBRILLATION ABLATION N/A 03/08/2022   Procedure: ATRIAL FIBRILLATION ABLATION;  Surgeon: Maurice Small, MD;  Location: [facility] INVASIVE CV LAB;  Service: Cardiovascular;  Laterality: N/A;   IR THORACENTESIS ASP PLEURAL SPACE W/IMG GUIDE  10/20/2020   RIGHT/LEFT HEART CATH AND CORONARY ANGIOGRAPHY N/A 10/07/2020   Procedure: RIGHT/LEFT HEART CATH AND CORONARY ANGIOGRAPHY;  Surgeon: Runell Gess, MD;  Location: [facility] INVASIVE CV LAB;  Service: Cardiovascular;  Laterality: N/A;   TEE WITHOUT CARDIOVERSION N/A 10/25/2020   Procedure: TRANSESOPHAGEAL ECHOCARDIOGRAM (TEE);  Surgeon: Quintella Reichert, MD;  Location: [facility] ENDOSCOPY;  Service: Cardiovascular;  Laterality: N/A;    Current Outpatient Medications  Medication Sig Dispense Refill   albuterol (VENTOLIN HFA) 108 (90 Base) MCG/ACT inhaler Inhale 2 puffs  into the lungs every 4 (four) hours as needed for wheezing.     amiodarone (PACERONE) 200 MG tablet Take 1 tablet (200 mg total) by mouth 2 (two) times daily. 60 tablet 11   apixaban (ELIQUIS) 2.5 MG TABS tablet Take 1 tablet (2.5 mg total) by mouth 2 (two) times daily. 60 tablet 11   atorvastatin (LIPITOR) 40 MG tablet Take 1 tablet by  mouth once daily 90 tablet 3   cyanocobalamin (,VITAMIN B-12,) 1000 MCG/ML injection Inject 1,000 mcg into the muscle every 30 (thirty) days.  3   furosemide (LASIX) 20 MG tablet Take 1 tablet (20 mg total) by mouth as needed. Leg edema 30 tablet 6   metoprolol tartrate (LOPRESSOR) 50 MG tablet Take 1 tablet (50 mg total) by mouth Once PRN for up to 1 dose (Take one tablet two hours prior to CT scan if heart rate is greater than 80). 1 tablet 0   nitroGLYCERIN (NITROSTAT) 0.4 MG SL tablet Place 1 tablet (0.4 mg total) under the tongue every 5 (five) minutes x 3 doses as needed for chest pain. 25 tablet 0   ondansetron (ZOFRAN) 4 MG tablet Take 4 mg by mouth 3 (three) times daily as needed for nausea or vomiting.     pantoprazole (PROTONIX) 40 MG tablet Take 1 tablet (40 mg total) by mouth 2 (two) times daily. 180 tablet 3   colchicine 0.6 MG tablet Take 1 tablet (0.6 mg total) by mouth 2 (two) times daily for 5 days. 10 tablet 0   No current facility-administered medications for this encounter.    Allergies  Allergen Reactions   Codeine Nausea And Vomiting and Other (See Comments)    Upsets the stomach    Social History   Socioeconomic History   Marital status: Married    Spouse name: Not on file   Number of children: Not on file   Years of education: Not on file   Highest education level: Not on file  Occupational History   Occupation: retired  Tobacco Use   Smoking status: Former    Packs/day: 1.00    Years: 65.00    Total pack years: 65.00    Types: Cigarettes    Quit date: 09/2020    Years since quitting: 1.5   Smokeless tobacco: Never   Tobacco comments:    Former smoker 04/04/22  Vaping Use   Vaping Use: Never used  Substance and Sexual Activity   Alcohol use: No    Alcohol/week: 0.0 standard drinks of alcohol    Comment: rarely   Drug use: No   Sexual activity: Not on file  Other Topics Concern   Not on file  Social History Narrative   Not on file   Social  Determinants of Health   Financial Resource Strain: Not on file  Food Insecurity: Not on file  Transportation Needs: No Transportation Needs (10/28/2020)   PRAPARE - Hydrologist (Medical): No    Lack of Transportation (Non-Medical): No  Physical Activity: Not on file  Stress: Not on file  Social Connections: Not on file  Intimate Partner Violence: Not on file     ROS- All systems are reviewed and negative except as per the HPI above.  Physical Exam: Vitals:   04/04/22 1315  BP: 134/80  Pulse: 72  Weight: 50.2 kg  Height: 4\' 8"  (1.422 m)    GEN- The patient is a well appearing elderly female, alert and oriented x 3 today.  Head- normocephalic, atraumatic Eyes-  Sclera clear, conjunctiva pink Ears- hearing intact Oropharynx- clear Neck- supple  Lungs- Clear to ausculation bilaterally, normal work of breathing Heart- Regular rate and rhythm, no murmurs, rubs or gallops  GI- soft, NT, ND, + BS Extremities- no clubbing, cyanosis, or edema MS- no significant deformity or atrophy Skin- no rash or lesion Psych- euthymic mood, full affect Neuro- strength and sensation are intact  Wt Readings from Last 3 Encounters:  04/04/22 50.2 kg  03/30/22 49.9 kg  03/09/22 50 kg    EKG today demonstrates  SR with short PR vs ectopic atrial rhythm, ST changes (baseline) Vent. rate 72 BPM PR interval 114 ms QRS duration 106 ms QT/QTcB 440/481 ms  Echo 11/29/21 demonstrated   1. Hypokinetic basal-mid anterior/anteroseptal wall. Left ventricular  ejection fraction, by estimation, is 40 to 45%. The left ventricle has  mildly decreased function. The left ventricle demonstrates global  hypokinesis. Left ventricular diastolic parameters are consistent with Grade II diastolic dysfunction (pseudonormalization).   2. Right ventricular systolic function is mildly reduced. The right  ventricular size is moderately enlarged. There is moderately elevated  pulmonary  artery systolic pressure. The estimated right ventricular  systolic pressure is 0000000 mmHg.   3. Left atrial size was severely dilated.   4. Right atrial size was severely dilated.   5. The mitral valve is normal in structure. Mild mitral valve  regurgitation.   6. Tricuspid valve regurgitation is moderate.   7. The aortic valve was not well visualized. Aortic valve regurgitation  is trivial.   8. The inferior vena cava is dilated in size with >50% respiratory  variability, suggesting right atrial pressure of 8 mmHg.   Comparison(s): No significant change from prior study.   Epic records are reviewed at length today  CHA2DS2-VASc Score = 6  The patient's score is based upon: CHF History: 1 HTN History: 1 Diabetes History: 0 Stroke History: 0 Vascular Disease History: 1 (aortic atherosclerosis) Age Score: 2 Gender Score: 1       ASSESSMENT AND PLAN: 1. Paroxysmal Atrial Fibrillation (ICD10:  I48.0) The patient's CHA2DS2-VASc score is 6, indicating a 9.7% annual risk of stroke.   S/p afib ablation 03/08/22 Patient appears to be maintaining SR.  Continue amiodarone 200 mg BID for now. Continue Eliquis 2.5 mg BID with no missed doses for 3 months post ablation.  Low suspicion for esophageal fistula with negative CT scan in ED, resolution of hemoptysis, and no other symptoms.   2. Secondary Hypercoagulable State (ICD10:  D68.69) The patient is at significant risk for stroke/thromboembolism based upon her CHA2DS2-VASc Score of 6.  Continue Apixaban (Eliquis).   3. Chronic HFrEF Suspected tachycardia mediated. Followed in the [facility], has visit later today with Dr Haroldine Laws. Fluid status appears stable today.  4. HTN Stable, no changes today.   Follow up with Dr Haroldine Laws and Dr Myles Gip as scheduled.    [facility] [address] [place], [place] [address] [phone] 04/04/2022 1:39 PM

## 2022-04-04 NOTE — Patient Instructions (Signed)
Medication Changes:  None, continue current medications  Lab Work:  none  Testing/Procedures:  Your physician has requested that you have an echocardiogram. Echocardiography is a painless test that uses sound waves to create images of your heart. It provides your doctor with information about the size and shape of your heart and how well your heart's chambers and valves are working. This procedure takes approximately one hour. There are no restrictions for this procedure. Please do NOT wear cologne, perfume, aftershave, or lotions (deodorant is allowed). Please arrive 15 minutes prior to your appointment time. IN 2 MONTHS WITH FOLLOW-UP APPOINTMENT  Referrals:  NONE  Special Instructions // Education:  Do the following things EVERYDAY: Weigh yourself in the morning before breakfast. Write it down and keep it in a log. Take your medicines as prescribed Eat low salt foods--Limit salt (sodium) to 2000 mg per day.  Stay as active as you can everyday Limit all fluids for the day to less than 2 liters   Follow-Up in: 2 MONTHS WITH AN ECHOCARDIOGRAM  At the [facility], you and your health needs are our priority. We have a designated team specialized in the treatment of Heart Failure. This Care Team includes your primary Heart Failure Specialized Cardiologist (physician), Advanced Practice Providers (APPs- Physician Assistants and Nurse Practitioners), and Pharmacist who all work together to provide you with the care you need, when you need it.   You may see any of the following providers on your designated Care Team at your next follow up:  Dr. Arvilla Meres Dr. Marca Ancona Dr. Marcos Eke, NP Robbie Lis, [place] [facility] [place], [place] Brynda Peon, NP Karle Plumber, PharmD   Please be sure to bring in all your medications bottles to every appointment.   Need to Contact us:  If you have any questions or concerns before your  next appointment please send [place] a message through [place] or call our office at [phone].    TO LEAVE A MESSAGE FOR THE NURSE SELECT OPTION 2, PLEASE LEAVE A MESSAGE INCLUDING: YOUR NAME DATE OF BIRTH CALL BACK NUMBER REASON FOR CALL**this is important as we prioritize the call backs  YOU WILL RECEIVE A CALL BACK THE SAME DAY AS LONG AS YOU CALL BEFORE 4:00 PM

## 2022-04-04 NOTE — Progress Notes (Signed)
Advanced [facility] Failure Clinic Note    PCP: Georgann Housekeeper, MD PCP-Cardiologist: Charlton Haws, MD  HF Cardiologist: Dr. Gala Romney  HPI: Laurie James is a 76 y.o. woman with severe COPD, blindness, malnutrition/frailty, HTN, systolic HF due to NICM (diagnosed 7/22) and atrial fibrillation.   Admitted 6/22 with new onset AF. EF normal and RV dilated. Cath with nonobstructive CAD. Sent home with [facility] and rate control w/ plans for outpatient DCCV after 3 weeks of a/c.   Readmitted 7/22 with acute HF -> cardiogenic shock. TEE EF 20% RV moderately down RA massively dilated. Heavy smoke in RA/LA and aortic root. DCCV canceled. Initial Co-ox 38% and required milrinone. Had right sided thoracentesis on 07/13 and subsequent CXRs showed some reaccumulation. Diuresed with IV lasix. Po lasix PRN at discharge.  D/c weight 99 lbs.  Echo 03/21/21: EF 55-60% RV dilated with normal function. Severe TR. Personally reviewed  Admitted 8/23 with afib RVR in the 150's. Given IV Cardizem 10 mg x 1 for rate control + IV Lasix. Eventually started amiodarone gtt and chemically converted out of RVR. Echo 8/23  showed EF: 40-45%, RV function mildly reduced RV mod enlarged. L/R atria severely dilated. Mild MR, Mod TR. Drips weaned, and oral amio started. Not ideal given COPD and EP outpatient follow up arranged. GDMT limited by mild AKI and Entresto held. Discharged home, weight 112 lbs.  She was seen back for f/u 9/22 and was doing well. Wt was stable. EKG showed NSR. AKI had resolved. Scr back to b/l, Scr down to 1.25. Sherryll Burger was restarted. Instructed to f/u in 2-3 wks for reassessment and further titration of meds.   In 10/23 had AKI and hyperkalemia. Scr increased from 1.25>>2.11. Entresto and spiro stopped. F/u check 03/30/22 SCr 1.29  AF ablation 03/08/22   Seen last week in ED for mild hemoptysis. CT chest ok. Now resolved.   She returns today for f/u. Here w/ daughter. Still fatigued. But able to do ADLs. No  CP. SOB with mild activity. Minimal edema. No CP. Remains in NSR. No bleeding   Cardiac Studies:  - Echo (8/23): EF 40-45%, grade II DD, RV mildly reduced, moderate TR  - Echo (6/23): EF 45-50%, RV ok, severe TR  - Echo (10/06/20): EF 55-60 %   - LHC (10/07/2020) showed mild 40% segmental RCA stenosis as well as 50 to 60% second diagonal branch stenosis  RHC 6/22 RA 3 PA 17/2 (8) PCWP 7 PVR  0.25 WU Fick 2.6/2.0 (LVEF was low)  - TEE (7/22): EF 20% RV moderately down RA massively dilated. Heavy smoke in RA/LA and aortic root.   Past Medical History:  Diagnosis Date   AMD (age related macular degeneration)    COPD (chronic obstructive pulmonary disease) (HCC)    Depression    Hiatal hernia    Intermittent low back pain    Memory impairment    Midsternal chest pain    a. 12/2011 Cardiac CTA Ca++ score of 103.3 (80th %), LAD <50p/m, RCA 50-75.   Osteoporosis    PAF (paroxysmal atrial fibrillation) (HCC) 09/2020   Vitamin B 12 deficiency    Current Outpatient Medications  Medication Sig Dispense Refill   albuterol (VENTOLIN HFA) 108 (90 Base) MCG/ACT inhaler Inhale 2 puffs into the lungs every 4 (four) hours as needed for wheezing.     amiodarone (PACERONE) 200 MG tablet Take 1 tablet (200 mg total) by mouth 2 (two) times daily. 60 tablet 11   apixaban (ELIQUIS) 2.5 MG TABS  tablet Take 1 tablet (2.5 mg total) by mouth 2 (two) times daily. 60 tablet 11   atorvastatin (LIPITOR) 40 MG tablet Take 1 tablet by mouth once daily 90 tablet 3   cyanocobalamin (,VITAMIN B-12,) 1000 MCG/ML injection Inject 1,000 mcg into the muscle every 30 (thirty) days.  3   furosemide (LASIX) 20 MG tablet Take 1 tablet (20 mg total) by mouth as needed. Leg edema 30 tablet 6   nitroGLYCERIN (NITROSTAT) 0.4 MG SL tablet Place 1 tablet (0.4 mg total) under the tongue every 5 (five) minutes x 3 doses as needed for chest pain. 25 tablet 0   ondansetron (ZOFRAN) 4 MG tablet Take 4 mg by mouth 3 (three) times  daily as needed for nausea or vomiting.     pantoprazole (PROTONIX) 40 MG tablet Take 1 tablet (40 mg total) by mouth 2 (two) times daily. 180 tablet 3   colchicine 0.6 MG tablet Take 1 tablet (0.6 mg total) by mouth 2 (two) times daily for 5 days. 10 tablet 0   No current facility-administered medications for this encounter.   Allergies  Allergen Reactions   Codeine Nausea And Vomiting and Other (See Comments)    Upsets the stomach   Social History   Socioeconomic History   Marital status: Married    Spouse name: Not on file   Number of children: Not on file   Years of education: Not on file   Highest education level: Not on file  Occupational History   Occupation: retired  Tobacco Use   Smoking status: Former    Packs/day: 1.00    Years: 65.00    Total pack years: 65.00    Types: Cigarettes    Quit date: 09/2020    Years since quitting: 1.5   Smokeless tobacco: Never   Tobacco comments:    Former smoker 04/04/22  Vaping Use   Vaping Use: Never used  Substance and Sexual Activity   Alcohol use: No    Alcohol/week: 0.0 standard drinks of alcohol    Comment: rarely   Drug use: No   Sexual activity: Not on file  Other Topics Concern   Not on file  Social History Narrative   Not on file   Social Determinants of Health   Financial Resource Strain: Not on file  Food Insecurity: Not on file  Transportation Needs: No Transportation Needs (10/28/2020)   PRAPARE - Administrator, Civil Service (Medical): No    Lack of Transportation (Non-Medical): No  Physical Activity: Not on file  Stress: Not on file  Social Connections: Not on file  Intimate Partner Violence: Not on file   Family History  Problem Relation Age of Onset   Breast cancer Sister    Breast cancer Sister    BP 118/82   Pulse 67   Wt 49.9 kg (110 lb)   SpO2 98%   BMI 24.66 kg/m   Wt Readings from Last 3 Encounters:  04/04/22 49.9 kg (110 lb)  04/04/22 50.2 kg (110 lb 9.6 oz)   03/30/22 49.9 kg (110 lb)   PHYSICAL EXAM: General:  elderly, frail appearing. No respiratory difficulty HEENT: normal Neck: supple. JVP 8  Carotids 2+ bilat; no bruits. No lymphadenopathy or thryomegaly appreciated. Cor: PMI nondisplaced. Regular rate & rhythm. No rubs, gallops or murmurs. Lungs: decreased air movement throughout  Abdomen: soft, nontender, nondistended. No hepatosplenomegaly. No bruits or masses. Good bowel sounds. Extremities: no cyanosis, clubbing, rash, tr-1+ edema Neuro: alert & orientedx3,  cranial nerves grossly intact. moves all 4 extremities w/o difficulty. Affect pleasant  ASSESSMENT & PLAN:  1. PAF  - PO amiodarone ultimately discontinued (h/o severe COPD) - Recurrent afib detected 5/23 but was well rate controlled - she does not tolerate Afib well (prior h/o tachy mediated CM and cardiogenic shock). Long term suppression w/ amiodarone not ideal w/ COPD.  -s/p AF RFA 11/23 - Remains in NSR - Continue Eliquis 5 mg bid.  Denies abnormal bleeding.   2. Chronic Systolic Heart Failure - NICM - Echo (6/22): EF 20% RV moderately down RA massively dilated. Tachymediated from rapid Afib. LHC w/ nonobstructive CAD - Echo (12/22): EF normalized w/ restoration and maintenance of NSR, 55-60%, RV normal - Echo (5/23): EF back down, 45-50% (recurrent Afib w/ CVR) - Limited echo (8/23): EF 40-45%, RV mildly reduced, L/R atria severely dilated. Mild MR, Mod TR - NYHA II-III - Off Entresto, spiro and Jardiance with recurrent AKI and hyperkalemia. Has failed twice. Will not rechallenge unless EF drops back down. If rechallenge consider Farxiga 5  - Continue Lasix 20 mg PRN.    3. AKI on CKD Stage IIIB and Hyperkalemia - Had AKI in 10/23 Entresto and Jardiance stopped - now improved   4. Hypertension   - Controlled on current regimen -  Stop Entresto per above    5. Tricuspid Regurgitation - Severe on prior Echo and LHC - Will need repeat echo now that she is back  in NSR    Arvilla Meres, MD  2:25 PM

## 2022-04-05 ENCOUNTER — Ambulatory Visit (HOSPITAL_COMMUNITY): Payer: Medicare Other | Admitting: Nurse Practitioner

## 2022-04-28 DIAGNOSIS — J069 Acute upper respiratory infection, unspecified: Secondary | ICD-10-CM | POA: Diagnosis not present

## 2022-04-28 DIAGNOSIS — E119 Type 2 diabetes mellitus without complications: Secondary | ICD-10-CM | POA: Diagnosis not present

## 2022-04-28 DIAGNOSIS — R0600 Dyspnea, unspecified: Secondary | ICD-10-CM | POA: Diagnosis not present

## 2022-04-28 DIAGNOSIS — J209 Acute bronchitis, unspecified: Secondary | ICD-10-CM | POA: Diagnosis not present

## 2022-04-28 DIAGNOSIS — R42 Dizziness and giddiness: Secondary | ICD-10-CM | POA: Diagnosis not present

## 2022-04-28 DIAGNOSIS — R110 Nausea: Secondary | ICD-10-CM | POA: Diagnosis not present

## 2022-05-01 ENCOUNTER — Inpatient Hospital Stay (HOSPITAL_COMMUNITY)
Admission: EM | Admit: 2022-05-01 | Discharge: 2022-05-03 | DRG: 292 | Disposition: A | Discharge status: Home / Self Care | Payer: Medicare PPO | Attending: Internal Medicine | Admitting: Internal Medicine

## 2022-05-01 ENCOUNTER — Emergency Department (HOSPITAL_COMMUNITY): Payer: Medicare PPO

## 2022-05-01 ENCOUNTER — Other Ambulatory Visit: Payer: Self-pay

## 2022-05-01 DIAGNOSIS — M109 Gout, unspecified: Secondary | ICD-10-CM | POA: Diagnosis present

## 2022-05-01 DIAGNOSIS — Z885 Allergy status to narcotic agent status: Secondary | ICD-10-CM | POA: Diagnosis not present

## 2022-05-01 DIAGNOSIS — E871 Hypo-osmolality and hyponatremia: Secondary | ICD-10-CM

## 2022-05-01 DIAGNOSIS — I480 Paroxysmal atrial fibrillation: Secondary | ICD-10-CM | POA: Diagnosis not present

## 2022-05-01 DIAGNOSIS — J90 Pleural effusion, not elsewhere classified: Secondary | ICD-10-CM | POA: Insufficient documentation

## 2022-05-01 DIAGNOSIS — K828 Other specified diseases of gallbladder: Secondary | ICD-10-CM | POA: Diagnosis not present

## 2022-05-01 DIAGNOSIS — R1110 Vomiting, unspecified: Secondary | ICD-10-CM | POA: Diagnosis not present

## 2022-05-01 DIAGNOSIS — K5900 Constipation, unspecified: Secondary | ICD-10-CM | POA: Diagnosis present

## 2022-05-01 DIAGNOSIS — J449 Chronic obstructive pulmonary disease, unspecified: Secondary | ICD-10-CM | POA: Diagnosis not present

## 2022-05-01 DIAGNOSIS — Z87891 Personal history of nicotine dependence: Secondary | ICD-10-CM | POA: Diagnosis not present

## 2022-05-01 DIAGNOSIS — R6889 Other general symptoms and signs: Secondary | ICD-10-CM | POA: Diagnosis not present

## 2022-05-01 DIAGNOSIS — Z0389 Encounter for observation for other suspected diseases and conditions ruled out: Secondary | ICD-10-CM | POA: Diagnosis not present

## 2022-05-01 DIAGNOSIS — J439 Emphysema, unspecified: Secondary | ICD-10-CM | POA: Diagnosis not present

## 2022-05-01 DIAGNOSIS — E876 Hypokalemia: Secondary | ICD-10-CM | POA: Diagnosis present

## 2022-05-01 DIAGNOSIS — R7989 Other specified abnormal findings of blood chemistry: Secondary | ICD-10-CM

## 2022-05-01 DIAGNOSIS — Z7901 Long term (current) use of anticoagulants: Secondary | ICD-10-CM

## 2022-05-01 DIAGNOSIS — Z66 Do not resuscitate: Secondary | ICD-10-CM | POA: Diagnosis not present

## 2022-05-01 DIAGNOSIS — H543 Unqualified visual loss, both eyes: Secondary | ICD-10-CM | POA: Diagnosis present

## 2022-05-01 DIAGNOSIS — K219 Gastro-esophageal reflux disease without esophagitis: Secondary | ICD-10-CM | POA: Diagnosis not present

## 2022-05-01 DIAGNOSIS — Z803 Family history of malignant neoplasm of breast: Secondary | ICD-10-CM

## 2022-05-01 DIAGNOSIS — E785 Hyperlipidemia, unspecified: Secondary | ICD-10-CM | POA: Diagnosis present

## 2022-05-01 DIAGNOSIS — I499 Cardiac arrhythmia, unspecified: Secondary | ICD-10-CM | POA: Diagnosis not present

## 2022-05-01 DIAGNOSIS — Z743 Need for continuous supervision: Secondary | ICD-10-CM | POA: Diagnosis not present

## 2022-05-01 DIAGNOSIS — Z79899 Other long term (current) drug therapy: Secondary | ICD-10-CM

## 2022-05-01 DIAGNOSIS — J918 Pleural effusion in other conditions classified elsewhere: Secondary | ICD-10-CM | POA: Diagnosis not present

## 2022-05-01 DIAGNOSIS — R1032 Left lower quadrant pain: Secondary | ICD-10-CM

## 2022-05-01 DIAGNOSIS — M810 Age-related osteoporosis without current pathological fracture: Secondary | ICD-10-CM | POA: Diagnosis present

## 2022-05-01 DIAGNOSIS — I5043 Acute on chronic combined systolic (congestive) and diastolic (congestive) heart failure: Principal | ICD-10-CM | POA: Diagnosis present

## 2022-05-01 DIAGNOSIS — R001 Bradycardia, unspecified: Secondary | ICD-10-CM | POA: Diagnosis not present

## 2022-05-01 DIAGNOSIS — I081 Rheumatic disorders of both mitral and tricuspid valves: Secondary | ICD-10-CM | POA: Diagnosis present

## 2022-05-01 DIAGNOSIS — R109 Unspecified abdominal pain: Secondary | ICD-10-CM

## 2022-05-01 DIAGNOSIS — I10 Essential (primary) hypertension: Secondary | ICD-10-CM | POA: Diagnosis not present

## 2022-05-01 LAB — LIPASE, BLOOD: Lipase: 28 U/L (ref 11–51)

## 2022-05-01 LAB — COMPREHENSIVE METABOLIC PANEL
ALT: 205 U/L — ABNORMAL HIGH (ref 0–44)
ALT: 243 U/L — ABNORMAL HIGH (ref 0–44)
AST: 259 U/L — ABNORMAL HIGH (ref 15–41)
AST: 308 U/L — ABNORMAL HIGH (ref 15–41)
Albumin: 2.2 g/dL — ABNORMAL LOW (ref 3.5–5.0)
Albumin: 2.6 g/dL — ABNORMAL LOW (ref 3.5–5.0)
Alkaline Phosphatase: 106 U/L (ref 38–126)
Alkaline Phosphatase: 111 U/L (ref 38–126)
Anion gap: 7 (ref 5–15)
Anion gap: 9 (ref 5–15)
BUN: 16 mg/dL (ref 8–23)
BUN: 17 mg/dL (ref 8–23)
CO2: 19 mmol/L — ABNORMAL LOW (ref 22–32)
CO2: 20 mmol/L — ABNORMAL LOW (ref 22–32)
Calcium: 6.5 mg/dL — ABNORMAL LOW (ref 8.9–10.3)
Calcium: 7.7 mg/dL — ABNORMAL LOW (ref 8.9–10.3)
Chloride: 107 mmol/L (ref 98–111)
Chloride: 100 mmol/L (ref 98–111)
Creatinine, Ser: 0.98 mg/dL (ref 0.44–1.00)
Creatinine, Ser: 1.04 mg/dL — ABNORMAL HIGH (ref 0.44–1.00)
GFR, Estimated: 60 mL/min — ABNORMAL LOW (ref >60)
GFR, Estimated: 56 mL/min — ABNORMAL LOW (ref >60)
Glucose, Bld: 85 mg/dL (ref 70–99)
Glucose, Bld: 95 mg/dL (ref 70–99)
Potassium: 2.9 mmol/L — ABNORMAL LOW (ref 3.5–5.1)
Potassium: 3.9 mmol/L (ref 3.5–5.1)
Sodium: 133 mmol/L — ABNORMAL LOW (ref 135–145)
Sodium: 129 mmol/L — ABNORMAL LOW (ref 135–145)
Total Bilirubin: 1.2 mg/dL (ref 0.3–1.2)
Total Bilirubin: 1.4 mg/dL — ABNORMAL HIGH (ref 0.3–1.2)
Total Protein: 5.5 g/dL — ABNORMAL LOW (ref 6.5–8.1)
Total Protein: 6.4 g/dL — ABNORMAL LOW (ref 6.5–8.1)

## 2022-05-01 LAB — MAGNESIUM: Magnesium: 1.8 mg/dL (ref 1.7–2.4)

## 2022-05-01 LAB — URINALYSIS, ROUTINE W REFLEX MICROSCOPIC
Bacteria, UA: NONE SEEN
Bilirubin Urine: NEGATIVE
Glucose, UA: 50 mg/dL — AB
Ketones, ur: NEGATIVE mg/dL
Leukocytes,Ua: NEGATIVE
Nitrite: NEGATIVE
Protein, ur: 100 mg/dL — AB
Specific Gravity, Urine: 1.029 (ref 1.005–1.030)
pH: 5 (ref 5.0–8.0)

## 2022-05-01 LAB — BRAIN NATRIURETIC PEPTIDE: B Natriuretic Peptide: 700 pg/mL — ABNORMAL HIGH (ref 0.0–100.0)

## 2022-05-01 LAB — CBC
HCT: 27.9 % — ABNORMAL LOW (ref 36.0–46.0)
Hemoglobin: 8.8 g/dL — ABNORMAL LOW (ref 12.0–15.0)
MCH: 29.1 pg (ref 26.0–34.0)
MCHC: 31.5 g/dL (ref 30.0–36.0)
MCV: 92.4 fL (ref 80.0–100.0)
Platelets: 324 10*3/uL (ref 150–400)
RBC: 3.02 MIL/uL — ABNORMAL LOW (ref 3.87–5.11)
RDW: 16.2 % — ABNORMAL HIGH (ref 11.5–15.5)
WBC: 9.3 10*3/uL (ref 4.0–10.5)
nRBC: 0 % (ref 0.0–0.2)

## 2022-05-01 LAB — TROPONIN I (HIGH SENSITIVITY)
Troponin I (High Sensitivity): 26 ng/L — ABNORMAL HIGH (ref <18)
Troponin I (High Sensitivity): 21 ng/L — ABNORMAL HIGH (ref <18)

## 2022-05-01 MED ORDER — COLCHICINE 0.6 MG PO TABS: 0.6000 mg | ORAL_TABLET | Freq: Two times a day (BID) | ORAL | Status: DC

## 2022-05-01 MED ORDER — SODIUM CHLORIDE 0.9 % IV SOLN: INTRAVENOUS | Status: DC

## 2022-05-01 MED ORDER — ACETAMINOPHEN 325 MG PO TABS: 650.0000 mg | ORAL_TABLET | Freq: Four times a day (QID) | ORAL | Status: DC | PRN

## 2022-05-01 MED ORDER — TRAZODONE HCL 50 MG PO TABS: 25.0000 mg | ORAL_TABLET | Freq: Every evening | ORAL | Status: DC | PRN

## 2022-05-01 MED ORDER — CYANOCOBALAMIN 1000 MCG/ML IJ SOLN: 1000.0000 ug | INTRAMUSCULAR | Status: DC

## 2022-05-01 MED ORDER — ONDANSETRON HCL 4 MG/2ML IJ SOLN
4.0000 mg | Freq: Four times a day (QID) | INTRAMUSCULAR | Status: DC | PRN
  Administered 2022-05-02: 4 mg via INTRAVENOUS
  Filled 2022-05-01: qty 2

## 2022-05-01 MED ORDER — FUROSEMIDE 10 MG/ML IJ SOLN
40.0000 mg | Freq: Two times a day (BID) | INTRAMUSCULAR | Status: DC
  Administered 2022-05-02 (×3): 40 mg via INTRAVENOUS
  Filled 2022-05-01 (×3): qty 4

## 2022-05-01 MED ORDER — MAGNESIUM HYDROXIDE 400 MG/5ML PO SUSP: 30.0000 mL | Freq: Every day | ORAL | Status: DC | PRN

## 2022-05-01 MED ORDER — ATORVASTATIN CALCIUM 40 MG PO TABS
40.0000 mg | ORAL_TABLET | Freq: Every day | ORAL | Status: DC
  Administered 2022-05-02 – 2022-05-03 (×2): 40 mg via ORAL
  Filled 2022-05-01 (×2): qty 1

## 2022-05-01 MED ORDER — AMIODARONE HCL 200 MG PO TABS
200.0000 mg | ORAL_TABLET | Freq: Two times a day (BID) | ORAL | Status: DC
  Administered 2022-05-01 – 2022-05-03 (×4): 200 mg via ORAL
  Filled 2022-05-01 (×4): qty 1

## 2022-05-01 MED ORDER — POTASSIUM CHLORIDE 10 MEQ/100ML IV SOLN
10.0000 meq | Freq: Once | INTRAVENOUS | Status: AC
  Administered 2022-05-01: 10 meq via INTRAVENOUS
  Filled 2022-05-01: qty 100

## 2022-05-01 MED ORDER — ENOXAPARIN SODIUM 30 MG/0.3ML IJ SOSY
30.0000 mg | PREFILLED_SYRINGE | INTRAMUSCULAR | Status: DC
  Administered 2022-05-02: 30 mg via SUBCUTANEOUS
  Filled 2022-05-01: qty 0.3

## 2022-05-01 MED ORDER — ACETAMINOPHEN 650 MG RE SUPP: 650.0000 mg | Freq: Four times a day (QID) | RECTAL | Status: DC | PRN

## 2022-05-01 MED ORDER — NITROGLYCERIN 0.4 MG SL SUBL: 0.4000 mg | SUBLINGUAL_TABLET | SUBLINGUAL | Status: DC | PRN

## 2022-05-01 MED ORDER — PANTOPRAZOLE SODIUM 40 MG PO TBEC
40.0000 mg | DELAYED_RELEASE_TABLET | Freq: Two times a day (BID) | ORAL | Status: DC
  Administered 2022-05-01 – 2022-05-03 (×4): 40 mg via ORAL
  Filled 2022-05-01 (×4): qty 1

## 2022-05-01 MED ORDER — ALBUTEROL SULFATE (2.5 MG/3ML) 0.083% IN NEBU: 3.0000 mL | INHALATION_SOLUTION | RESPIRATORY_TRACT | Status: DC | PRN

## 2022-05-01 MED ORDER — IOHEXOL 350 MG/ML SOLN
75.0000 mL | Freq: Once | INTRAVENOUS | Status: AC | PRN
  Administered 2022-05-01: 75 mL via INTRAVENOUS

## 2022-05-01 MED ORDER — ONDANSETRON HCL 4 MG PO TABS: 4.0000 mg | ORAL_TABLET | Freq: Four times a day (QID) | ORAL | Status: DC | PRN

## 2022-05-01 MED ORDER — ONDANSETRON HCL 4 MG/2ML IJ SOLN
4.0000 mg | Freq: Once | INTRAMUSCULAR | Status: AC | PRN
  Administered 2022-05-01: 4 mg via INTRAVENOUS
  Filled 2022-05-01: qty 2

## 2022-05-01 MED ORDER — SODIUM CHLORIDE 0.9 % IV BOLUS
1000.0000 mL | Freq: Once | INTRAVENOUS | Status: AC
  Administered 2022-05-01: 1000 mL via INTRAVENOUS

## 2022-05-01 NOTE — ED Provider Notes (Signed)
[place] Provider Note   CSN: [id number] Arrival date & time: 05/01/22  1630     History  Chief Complaint  Patient presents with   Nausea   Emesis    Laurie James Laurie James is a 77 y.o. female hx of afib on eliquis, here presenting with nausea and vomiting and abdominal pain.  Patient has recurrent pleural effusions.  Patient states that she has been vomiting since yesterday.  Has left lower abdominal pain as well.  Also has some dysuria.  The history is provided by the patient.       Home Medications Prior to Admission medications   Medication Sig Start Date End Date Taking? Authorizing Provider  albuterol (VENTOLIN HFA) 108 (90 Base) MCG/ACT inhaler Inhale 2 puffs into the lungs every 4 (four) hours as needed for wheezing. 06/08/20   [provider]  amiodarone (PACERONE) 200 MG tablet Take 1 tablet (200 mg total) by mouth 2 (two) times daily. 12/26/21   Bensimhon, Shaune Pascal, MD  apixaban (ELIQUIS) 2.5 MG TABS tablet Take 1 tablet (2.5 mg total) by mouth 2 (two) times daily. 01/17/22   Bensimhon, Shaune Pascal, MD  atorvastatin (LIPITOR) 40 MG tablet Take 1 tablet by mouth once daily 07/26/21   Josue Hector, MD  colchicine 0.6 MG tablet Take 1 tablet (0.6 mg total) by mouth 2 (two) times daily for 5 days. 03/08/22 03/13/22  Shirley Friar, PA-C  cyanocobalamin (,VITAMIN B-12,) 1000 MCG/ML injection Inject 1,000 mcg into the muscle every 30 (thirty) days. 06/11/16   [provider]  furosemide (LASIX) 20 MG tablet Take 1 tablet (20 mg total) by mouth as needed. Leg edema 12/30/21   Rafael Bihari, FNP  nitroGLYCERIN (NITROSTAT) 0.4 MG SL tablet Place 1 tablet (0.4 mg total) under the tongue every 5 (five) minutes x 3 doses as needed for chest pain. 10/11/20   Tommie Raymond, NP  ondansetron (ZOFRAN) 4 MG tablet Take 4 mg by mouth 3 (three) times daily as needed for nausea or vomiting. 11/22/20   [provider]  pantoprazole (PROTONIX) 40 MG tablet Take 1 tablet (40 mg total) by mouth 2 (two) times daily. 08/23/21   Bensimhon, Shaune Pascal, MD      Allergies    Codeine    Review of Systems   Review of Systems  Gastrointestinal:  Positive for abdominal pain and vomiting.  All other systems reviewed and are negative.   Physical Exam Updated Vital Signs BP (!) 154/68 (BP Location: Left Arm)   Pulse (!) 51   Temp 97.8 F (36.6 C) (Oral)   Resp 15   SpO2 100%  Physical Exam Vitals and nursing note reviewed.  Constitutional:      Appearance: Normal appearance.  HENT:     Head: Normocephalic.     Nose: Nose normal.     Mouth/Throat:     Mouth: Mucous membranes are dry.  Eyes:     Extraocular Movements: Extraocular movements intact.     Pupils: Pupils are equal, round, and reactive to light.  Cardiovascular:     Rate and Rhythm: Normal rate and regular rhythm.     Pulses: Normal pulses.     Heart sounds: Normal heart sounds.  Pulmonary:     Comments: Crackles in the right base Abdominal:     General: Abdomen is flat.     Palpations: Abdomen is soft.  Musculoskeletal:  General: Normal range of motion.     Cervical back: Normal range of motion and neck supple.  Skin:    General: Skin is warm.  Neurological:     General: No focal deficit present.     Mental Status: She is alert and oriented to person, place, and time.  Psychiatric:        Mood and Affect: Mood normal.        Behavior: Behavior normal.     ED Results / Procedures / Treatments   Labs (all labs ordered are listed, but only abnormal results are displayed) Labs Reviewed  COMPREHENSIVE METABOLIC PANEL - Abnormal; Notable for the following components:      Result Value   Sodium 133 (*)    Potassium 2.9 (*)    CO2 19 (*)    Calcium 6.5 (*)    Total Protein 5.5 (*)    Albumin 2.2 (*)    AST 259 (*)    ALT 205 (*)    GFR, Estimated 60 (*)    All other components within normal limits  CBC -  Abnormal; Notable for the following components:   RBC 3.02 (*)    Hemoglobin 8.8 (*)    HCT 27.9 (*)    RDW 16.2 (*)    All other components within normal limits  URINALYSIS, ROUTINE W REFLEX MICROSCOPIC - Abnormal; Notable for the following components:   APPearance HAZY (*)    Glucose, UA 50 (*)    Hgb urine dipstick SMALL (*)    Protein, ur 100 (*)    All other components within normal limits  COMPREHENSIVE METABOLIC PANEL - Abnormal; Notable for the following components:   Sodium 129 (*)    CO2 20 (*)    Creatinine, Ser 1.04 (*)    Calcium 7.7 (*)    Total Protein 6.4 (*)    Albumin 2.6 (*)    AST 308 (*)    ALT 243 (*)    Total Bilirubin 1.4 (*)    GFR, Estimated 56 (*)    All other components within normal limits  TROPONIN I (HIGH SENSITIVITY) - Abnormal; Notable for the following components:   Troponin I (High Sensitivity) 26 (*)    All other components within normal limits  TROPONIN I (HIGH SENSITIVITY) - Abnormal; Notable for the following components:   Troponin I (High Sensitivity) 21 (*)    All other components within normal limits  LIPASE, BLOOD  MAGNESIUM  BRAIN NATRIURETIC PEPTIDE    EKG None  Radiology DG Chest 2 View  Result Date: 05/01/2022 CLINICAL DATA:  Pleural effusion vomiting EXAM: CHEST - 2 VIEW COMPARISON:  11/28/2021, CT 03/30/2022 FINDINGS: Moderate right and small moderate left pleural effusions. Emphysema. Airspace disease at both lung bases. Borderline cardiomegaly with aortic atherosclerosis. Pleuroparenchymal scarring at the apices. No pneumothorax IMPRESSION: Moderate right and small-to-moderate left pleural effusions with airspace disease at both lung bases. Emphysema Electronically Signed   By: Donavan Foil M.D.   On: 05/01/2022 20:40   CT ABDOMEN PELVIS W CONTRAST  Result Date: 05/01/2022 CLINICAL DATA:  Abdominal pain. EXAM: CT ABDOMEN AND PELVIS WITH CONTRAST TECHNIQUE: Multidetector CT imaging of the abdomen and pelvis was performed  using the standard protocol following bolus administration of intravenous contrast. RADIATION DOSE REDUCTION: This exam was performed according to the departmental dose-optimization program which includes automated exposure control, adjustment of the mA and/or kV according to patient size and/or use of iterative reconstruction technique. CONTRAST:  70mL OMNIPAQUE IOHEXOL  350 MG/ML SOLN COMPARISON:  March 31, 2022 FINDINGS: Lower chest: Mild fibrotic and atelectatic changes are seen within the bilateral lung bases. There is a moderate sized right pleural effusion. A small left pleural effusion is also seen. Hepatobiliary: No focal liver abnormality is seen. A mild amount increased attenuation is noted within the dependent portion of a moderately distended gallbladder. There is no evidence of gallbladder wall thickening or biliary dilatation. Pancreas: Unremarkable. No pancreatic ductal dilatation or surrounding inflammatory changes. Spleen: Normal in size without focal abnormality. Adrenals/Urinary Tract: The right adrenal gland is poorly visualized. The left adrenal gland is slightly nodular in appearance. Kidneys are normal in size, without focal lesions. There is predominantly stable moderate to marked severity dilatation of the right renal pelvis and intrarenal calices within the lower pole of the right kidney. No renal calculi are identified. Bladder is unremarkable. Stomach/Bowel: A small, stable hiatal hernia is seen. The appendix is not clearly identified. A large amount of stool is seen within the cecum and proximal to mid ascending colon. No evidence of bowel wall thickening, distention, or inflammatory changes. Vascular/Lymphatic: Aortic atherosclerosis. No enlarged abdominal or pelvic lymph nodes. Reproductive: Status post hysterectomy. No adnexal masses. Other: No abdominal wall hernia or abnormality. No abdominopelvic ascites. Musculoskeletal: There is marked severity dextroscoliosis of the lumbar  spine with marked severity multilevel degenerative changes also seen. IMPRESSION: 1. Moderate sized right pleural effusion. 2. Small left pleural effusion. 3. Predominantly stable dilatation of the right renal pelvis and intrarenal calices within the lower pole of the right kidney. 4. Small, stable hiatal hernia. 5. Large stool burden within the cecum and proximal to mid ascending colon. 6. Marked severity dextroscoliosis of the lumbar spine with marked severity multilevel degenerative changes. 7. Aortic atherosclerosis. Aortic Atherosclerosis (ICD10-I70.0). Electronically Signed   By: Aram Candela M.D.   On: 05/01/2022 17:56    Procedures Procedures    Medications Ordered in ED Medications  ondansetron (ZOFRAN) injection 4 mg (has no administration in time range)  sodium chloride 0.9 % bolus 1,000 mL (0 mLs Intravenous Stopped 05/01/22 1827)  iohexol (OMNIPAQUE) 350 MG/ML injection 75 mL (75 mLs Intravenous Contrast Given 05/01/22 1726)  potassium chloride 10 mEq in 100 mL IVPB (0 mEq Intravenous Stopped 05/01/22 1947)    ED Course/ Medical Decision Making/ A&P                             Medical Decision Making Kayla Deshaies Jennette Dubin Laverdure is a 77 y.o. female here presenting with abdominal pain.  Consider colitis versus UTI versus pyelonephritis.  Plan to get CBC and CMP and UA and CT abdomen pelvis.  Will hydrate and reassess  10 pm CT showed bilateral pleural effusions.  Chest x-ray showed moderate right to small and moderate left pleural effusions.  Patient has recurrent pleural effusions.  Unclear what the etiology is.  Her LFTs are also elevated.  Initially her potassium was low at 2.9 and supplemented now is normal.  At this point hospitalist to admit for IR guided thoracentesis.   Problems Addressed: Abdominal pain, unspecified abdominal location: acute illness or injury Pleural effusion: acute illness or injury  Amount and/or Complexity of Data Reviewed Labs: ordered.  Decision-making details documented in ED Course. Radiology: ordered and independent interpretation performed. Decision-making details documented in ED Course.  Risk Prescription drug management.    Final Clinical Impression(s) / ED Diagnoses Final diagnoses:  None    Rx /  DC Orders ED Discharge Orders     None         Charlynne Pander, MD 05/01/22 2205

## 2022-05-01 NOTE — Assessment & Plan Note (Signed)
-  This is likely secondary to acute on chronic combined systolic and diastolic CHF. - She will be admitted to a medical telemetry bed. - Will continue diuresis with IV Lasix. - IR consult to be obtained for paracentesis.

## 2022-05-01 NOTE — Assessment & Plan Note (Signed)
-  We will continue to amiodarone and Eliquis.

## 2022-05-01 NOTE — ED Triage Notes (Signed)
PT arrived from home with c/o n/v since Friday at 1700. Pt has lower abdominal pain. 4mg  Zofran given en route.

## 2022-05-01 NOTE — Assessment & Plan Note (Signed)
-  We will continue statin therapy. 

## 2022-05-01 NOTE — Assessment & Plan Note (Addendum)
-  Will continue diuresis with IV Lasix. - We will continue Entresto. - We will follow serial troponins. - Last 2D echo on 11/29/2021 revealed an EF of 40 to 45% with grade 2 diastolic dysfunction, severely dilated left and right atria, mild mitral regurgitation and moderate tricuspid regurgitation.

## 2022-05-01 NOTE — H&P (Addendum)
[place]   PATIENT NAME: Laurie James    MR#:  [id number]  DATE OF BIRTH:  Nov 27, 1945  DATE OF ADMISSION:  05/01/2022  PRIMARY CARE PHYSICIAN: Georgann Housekeeper, MD   Patient is coming from: Home  REQUESTING/REFERRING PHYSICIAN: Charlynne Pander, MD   CHIEF COMPLAINT:   Chief Complaint  Patient presents with   Nausea   Emesis    HISTORY OF PRESENT ILLNESS:  Laurie James is a 77 y.o. Caucasian female with medical history significant for COPD, depression, paroxysmal atrial fibrillation and low back pain and bilateral blindness who presented to the emergency room with acute onset of nausea and vomiting with associated left lower quadrant abdominal pain since Friday.  She admitted to recent dyspnea with associated cough productive of clear sputum as well as wheezing.  No chest pain or palpitations.  No diarrhea or melena or bright red bleeding per rectum.  No bilious vomitus or hematemesis.  No fever or chills.  She has mild dysuria without urinary frequency or urgency or flank pain.  ED Course: Upon presentation to the emergency room, BP was 141/78 with otherwise normal vital signs.  Labs revealed revealed hyponatremia of 133 and later 129.  Potassium was 2.9 and later 3.9 and CO2 19 and later 20.  Calcium was 6.5 and later 7.7.  Albumin 2.2 L 2.6 AST 259 and later 308, ALT 205 elevated to 43.  Total protein was 5.5 and later 6.4.  Total bili was 1.2 and later 1.4 BNP was 700.  High sensitive troponin I was 2621.  CBC showed hemoglobin 8.8 and hematocrit 27.9.  UA showed 50 glucose and 100 protein with otherwise within normal. EKG as reviewed by me : Sinus bradycardia with a rate of 57 with anterolateral and inferior T wave inversion. Imaging: Abdominal and pelvic CT scan with contrast. 1. Moderate sized right pleural effusion. 2. Small left pleural effusion. 3. Predominantly stable dilatation of the right renal pelvis and intrarenal calices within the lower  pole of the right kidney. 4. Small, stable hiatal hernia. 5. Large stool burden within the cecum and proximal to mid ascending colon. 6. Marked severity dextroscoliosis of the lumbar spine with marked severity multilevel degenerative changes. 7. Aortic atherosclerosis.  2 view chest x-ray showed moderate right and small to moderate left pleural effusion with airspace disease at both lung bases and emphysema.  The patient was given 1 L bolus of IV normal saline and 10 mill equivalent IV potassium chloride as well as 4 mg of IV Zofran.  She will be admitted to a medical telemetry bed for further evaluation and management.   PAST MEDICAL HISTORY:   Past Medical History:  Diagnosis Date   AMD (age related macular degeneration)    COPD (chronic obstructive pulmonary disease) (HCC)    Depression    Hiatal hernia    Intermittent low back pain    Memory impairment    Midsternal chest pain    a. 12/2011 Cardiac CTA Ca++ score of 103.3 (80th %), LAD <50p/m, RCA 50-75.   Osteoporosis    PAF (paroxysmal atrial fibrillation) (HCC) 09/2020   Vitamin B 12 deficiency     PAST SURGICAL HISTORY:   Past Surgical History:  Procedure Laterality Date   ABDOMINAL HYSTERECTOMY     ATRIAL FIBRILLATION ABLATION N/A 03/08/2022   Procedure: ATRIAL FIBRILLATION ABLATION;  Surgeon: Maurice Small, MD;  Location: [facility];  Service: Cardiovascular;  Laterality: N/A;   IR  THORACENTESIS ASP PLEURAL SPACE W/IMG GUIDE  10/20/2020   RIGHT/LEFT HEART CATH AND CORONARY ANGIOGRAPHY N/A 10/07/2020   Procedure: RIGHT/LEFT HEART CATH AND CORONARY ANGIOGRAPHY;  Surgeon: Lorretta Harp, MD;  Location: [facility];  Service: Cardiovascular;  Laterality: N/A;   TEE WITHOUT CARDIOVERSION N/A 10/25/2020   Procedure: TRANSESOPHAGEAL ECHOCARDIOGRAM (TEE);  Surgeon: Sueanne Margarita, MD;  Location: [facility] ENDOSCOPY;  Service: Cardiovascular;  Laterality: N/A;    SOCIAL HISTORY:   Social History   Tobacco  Use   Smoking status: Former    Packs/day: 1.00    Years: 65.00    Total pack years: 65.00    Types: Cigarettes    Quit date: 09/2020    Years since quitting: 1.6   Smokeless tobacco: Never   Tobacco comments:    Former smoker 04/04/22  Substance Use Topics   Alcohol use: No    Alcohol/week: 0.0 standard drinks of alcohol    Comment: rarely    FAMILY HISTORY:   Family History  Problem Relation Age of Onset   Breast cancer Sister    Breast cancer Sister     DRUG ALLERGIES:   Allergies  Allergen Reactions   Codeine Nausea And Vomiting and Other (See Comments)    Upsets the stomach    REVIEW OF SYSTEMS:   ROS As per history of present illness. All pertinent systems were reviewed above. Constitutional, HEENT, cardiovascular, respiratory, GI, GU, musculoskeletal, neuro, psychiatric, endocrine, integumentary and hematologic systems were reviewed and are otherwise negative/unremarkable except for positive findings mentioned above in the HPI.   MEDICATIONS AT HOME:   Prior to Admission medications   Medication Sig Start Date End Date Taking? Authorizing Provider  albuterol (VENTOLIN HFA) 108 (90 Base) MCG/ACT inhaler Inhale 2 puffs into the lungs every 4 (four) hours as needed for wheezing. 06/08/20   [provider]  amiodarone (PACERONE) 200 MG tablet Take 1 tablet (200 mg total) by mouth 2 (two) times daily. 12/26/21   Bensimhon, Shaune Pascal, MD  apixaban (ELIQUIS) 2.5 MG TABS tablet Take 1 tablet (2.5 mg total) by mouth 2 (two) times daily. 01/17/22   Bensimhon, Shaune Pascal, MD  atorvastatin (LIPITOR) 40 MG tablet Take 1 tablet by mouth once daily 07/26/21   Josue Hector, MD  colchicine 0.6 MG tablet Take 1 tablet (0.6 mg total) by mouth 2 (two) times daily for 5 days. 03/08/22 03/13/22  Shirley Friar, PA-C  cyanocobalamin (,VITAMIN B-12,) 1000 MCG/ML injection Inject 1,000 mcg into the muscle every 30 (thirty) days. 06/11/16   [provider]   furosemide (LASIX) 20 MG tablet Take 1 tablet (20 mg total) by mouth as needed. Leg edema 12/30/21   Rafael Bihari, FNP  nitroGLYCERIN (NITROSTAT) 0.4 MG SL tablet Place 1 tablet (0.4 mg total) under the tongue every 5 (five) minutes x 3 doses as needed for chest pain. 10/11/20   Tommie Raymond, NP  ondansetron (ZOFRAN) 4 MG tablet Take 4 mg by mouth 3 (three) times daily as needed for nausea or vomiting. 11/22/20   [provider]  pantoprazole (PROTONIX) 40 MG tablet Take 1 tablet (40 mg total) by mouth 2 (two) times daily. 08/23/21   Bensimhon, Shaune Pascal, MD      VITAL SIGNS:  Blood pressure (!) 149/68, pulse (!) 52, temperature 98 F (36.7 C), temperature source Oral, resp. rate 18, height 4\' 8"  (1.422 m), weight 49.9 kg, SpO2 96 %.  PHYSICAL EXAMINATION:  Physical Exam  GENERAL:  77 y.o.-year-old Caucasian female patient lying in the bed with no acute distress.  EYES: Bilateral eye blindness.  No scleral icterus. Extraocular muscles intact.  HEENT: Head atraumatic, normocephalic. Oropharynx and nasopharynx clear.  NECK:  Supple, no jugular venous distention. No thyroid enlargement, no tenderness.  LUNGS: Diminished bibasilar breath sounds.  No use of accessory muscles of respiration.  CARDIOVASCULAR: Regular rate and rhythm, S1, S2 normal. No murmurs, rubs, or gallops.  ABDOMEN: Soft, nondistended, nontender. Bowel sounds present. No organomegaly or mass.  EXTREMITIES: No pedal edema, cyanosis, or clubbing.  NEUROLOGIC: Cranial nerves II through XII are intact except for bilateral eye blindness. Muscle strength 5/5 in all extremities. Sensation intact. Gait not checked.  PSYCHIATRIC: The patient is alert and oriented x 3.  Normal affect and good eye contact. SKIN: No obvious rash, lesion, or ulcer.   LABORATORY PANEL:   CBC Recent Labs  Lab 05/01/22 1635  WBC 9.3  HGB 8.8*  HCT 27.9*  PLT 324    ------------------------------------------------------------------------------------------------------------------  Chemistries  Recent Labs  Lab 05/01/22 1815  NA 129*  K 3.9  CL 100  CO2 20*  GLUCOSE 95  BUN 17  CREATININE 1.04*  CALCIUM 7.7*  MG 1.8  AST 308*  ALT 243*  ALKPHOS 111  BILITOT 1.4*   ------------------------------------------------------------------------------------------------------------------  Cardiac Enzymes No results for input(s): "TROPONINI" in the last 168 hours. ------------------------------------------------------------------------------------------------------------------  RADIOLOGY:  DG Chest 2 View  Result Date: 05/01/2022 CLINICAL DATA:  Pleural effusion vomiting EXAM: CHEST - 2 VIEW COMPARISON:  11/28/2021, CT 03/30/2022 FINDINGS: Moderate right and small moderate left pleural effusions. Emphysema. Airspace disease at both lung bases. Borderline cardiomegaly with aortic atherosclerosis. Pleuroparenchymal scarring at the apices. No pneumothorax IMPRESSION: Moderate right and small-to-moderate left pleural effusions with airspace disease at both lung bases. Emphysema Electronically Signed   By: Donavan Foil M.D.   On: 05/01/2022 20:40   CT ABDOMEN PELVIS W CONTRAST  Result Date: 05/01/2022 CLINICAL DATA:  Abdominal pain. EXAM: CT ABDOMEN AND PELVIS WITH CONTRAST TECHNIQUE: Multidetector CT imaging of the abdomen and pelvis was performed using the standard protocol following bolus administration of intravenous contrast. RADIATION DOSE REDUCTION: This exam was performed according to the departmental dose-optimization program which includes automated exposure control, adjustment of the mA and/or kV according to patient size and/or use of iterative reconstruction technique. CONTRAST:  33mL OMNIPAQUE IOHEXOL 350 MG/ML SOLN COMPARISON:  March 31, 2022 FINDINGS: Lower chest: Mild fibrotic and atelectatic changes are seen within the bilateral lung  bases. There is a moderate sized right pleural effusion. A small left pleural effusion is also seen. Hepatobiliary: No focal liver abnormality is seen. A mild amount increased attenuation is noted within the dependent portion of a moderately distended gallbladder. There is no evidence of gallbladder wall thickening or biliary dilatation. Pancreas: Unremarkable. No pancreatic ductal dilatation or surrounding inflammatory changes. Spleen: Normal in size without focal abnormality. Adrenals/Urinary Tract: The right adrenal gland is poorly visualized. The left adrenal gland is slightly nodular in appearance. Kidneys are normal in size, without focal lesions. There is predominantly stable moderate to marked severity dilatation of the right renal pelvis and intrarenal calices within the lower pole of the right kidney. No renal calculi are identified. Bladder is unremarkable. Stomach/Bowel: A small, stable hiatal hernia is seen. The appendix is not clearly identified. A large amount of stool is seen within the cecum and proximal to mid ascending colon. No evidence of bowel wall thickening, distention, or inflammatory changes. Vascular/Lymphatic: Aortic atherosclerosis. No  enlarged abdominal or pelvic lymph nodes. Reproductive: Status post hysterectomy. No adnexal masses. Other: No abdominal wall hernia or abnormality. No abdominopelvic ascites. Musculoskeletal: There is marked severity dextroscoliosis of the lumbar spine with marked severity multilevel degenerative changes also seen. IMPRESSION: 1. Moderate sized right pleural effusion. 2. Small left pleural effusion. 3. Predominantly stable dilatation of the right renal pelvis and intrarenal calices within the lower pole of the right kidney. 4. Small, stable hiatal hernia. 5. Large stool burden within the cecum and proximal to mid ascending colon. 6. Marked severity dextroscoliosis of the lumbar spine with marked severity multilevel degenerative changes. 7. Aortic  atherosclerosis. Aortic Atherosclerosis (ICD10-I70.0). Electronically Signed   By: Aram Candela M.D.   On: 05/01/2022 17:56      IMPRESSION AND PLAN:  Assessment and Plan: Bilateral pleural effusion - This is likely secondary to acute on chronic combined systolic and diastolic CHF. - She will be admitted to a medical telemetry bed. - Will continue diuresis with IV Lasix. - IR consult to be obtained for paracentesis.  Acute on chronic combined systolic and diastolic CHF (congestive heart failure) (HCC) - Will continue diuresis with IV Lasix. - We will continue Entresto. - We will follow serial troponins. - Last 2D echo on 11/29/2021 revealed an EF of 40 to 45% with grade 2 diastolic dysfunction, severely dilated left and right atria, mild mitral regurgitation and moderate tricuspid regurgitation.  Left lower quadrant abdominal pain - No acute findings on abdominal CT except for constipation. - We will manage her constipation. - This could be contributing to her nausea and vomiting.  She could also be having mild associated enteritis.  Hypokalemia - We will replace potassium and check magnesium level.  Hyponatremia - Will obtain hyponatremia workup. - This is likely hypervolemic. - We will follow BMP with diuresis.  Elevated LFTs - This could be related to congestive hepatopathy. - We will follow LFTs with diuresis.  Gout - We will continue colchicine.  GERD without esophagitis - We will continue PPI therapy.  Paroxysmal atrial fibrillation (HCC) - We will continue to amiodarone and Eliquis.  Dyslipidemia - We will continue statin therapy.    DVT prophylaxis: Eliquis. Advanced Care Planning:  Code Status: The patient is DNR/DNI.   Family Communication:  The plan of care was discussed in details with the patient (and family). I answered all questions. The patient agreed to proceed with the above mentioned plan. Further management will depend upon hospital  course. Disposition Plan: Back to previous home environment Consults called: IR. All the records are reviewed and case discussed with ED provider.  Status is: Inpatient   At the time of the admission, it appears that the appropriate admission status for this patient is inpatient.  This is judged to be reasonable and necessary in order to provide the required intensity of service to ensure the patient's safety given the presenting symptoms, physical exam findings and initial radiographic and laboratory data in the context of comorbid conditions.  The patient requires inpatient status due to high intensity of service, high risk of further deterioration and high frequency of surveillance required.  I certify that at the time of admission, it is my clinical judgment that the patient will require inpatient hospital care extending more than 2 midnights.                            Dispo: The patient is from: Home  Anticipated d/c is to: Home              Patient currently is not medically stable to d/c.              Difficult to place patient: No  Hannah Beat M.D on 05/02/2022 at 12:07 AM  [facility]   From 7 PM-7 AM, contact night-coverage [email or web address]  CC: Primary care physician; Georgann Housekeeper, MD

## 2022-05-02 ENCOUNTER — Inpatient Hospital Stay (HOSPITAL_COMMUNITY): Payer: Medicare PPO

## 2022-05-02 DIAGNOSIS — J90 Pleural effusion, not elsewhere classified: Secondary | ICD-10-CM | POA: Diagnosis not present

## 2022-05-02 DIAGNOSIS — E871 Hypo-osmolality and hyponatremia: Secondary | ICD-10-CM

## 2022-05-02 DIAGNOSIS — I5043 Acute on chronic combined systolic (congestive) and diastolic (congestive) heart failure: Secondary | ICD-10-CM | POA: Diagnosis not present

## 2022-05-02 DIAGNOSIS — R109 Unspecified abdominal pain: Secondary | ICD-10-CM

## 2022-05-02 DIAGNOSIS — M109 Gout, unspecified: Secondary | ICD-10-CM | POA: Insufficient documentation

## 2022-05-02 DIAGNOSIS — K219 Gastro-esophageal reflux disease without esophagitis: Secondary | ICD-10-CM

## 2022-05-02 DIAGNOSIS — I480 Paroxysmal atrial fibrillation: Secondary | ICD-10-CM

## 2022-05-02 DIAGNOSIS — E876 Hypokalemia: Secondary | ICD-10-CM

## 2022-05-02 DIAGNOSIS — R1032 Left lower quadrant pain: Secondary | ICD-10-CM

## 2022-05-02 DIAGNOSIS — R7989 Other specified abnormal findings of blood chemistry: Secondary | ICD-10-CM

## 2022-05-02 HISTORY — PX: IR THORACENTESIS ASP PLEURAL SPACE W/IMG GUIDE: IMG5380

## 2022-05-02 LAB — AMYLASE, PLEURAL OR PERITONEAL FLUID: Amylase, Fluid: 23 U/L

## 2022-05-02 LAB — BASIC METABOLIC PANEL
Anion gap: 10 (ref 5–15)
BUN: 16 mg/dL (ref 8–23)
CO2: 20 mmol/L — ABNORMAL LOW (ref 22–32)
Calcium: 8 mg/dL — ABNORMAL LOW (ref 8.9–10.3)
Chloride: 101 mmol/L (ref 98–111)
Creatinine, Ser: 1.06 mg/dL — ABNORMAL HIGH (ref 0.44–1.00)
GFR, Estimated: 54 mL/min — ABNORMAL LOW (ref >60)
Glucose, Bld: 99 mg/dL (ref 70–99)
Potassium: 3.8 mmol/L (ref 3.5–5.1)
Sodium: 131 mmol/L — ABNORMAL LOW (ref 135–145)

## 2022-05-02 LAB — CBC
HCT: 28.2 % — ABNORMAL LOW (ref 36.0–46.0)
Hemoglobin: 9.1 g/dL — ABNORMAL LOW (ref 12.0–15.0)
MCH: 29.3 pg (ref 26.0–34.0)
MCHC: 32.3 g/dL (ref 30.0–36.0)
MCV: 90.7 fL (ref 80.0–100.0)
Platelets: 331 10*3/uL (ref 150–400)
RBC: 3.11 MIL/uL — ABNORMAL LOW (ref 3.87–5.11)
RDW: 16.5 % — ABNORMAL HIGH (ref 11.5–15.5)
WBC: 8 10*3/uL (ref 4.0–10.5)
nRBC: 0 % (ref 0.0–0.2)

## 2022-05-02 LAB — BODY FLUID CELL COUNT WITH DIFFERENTIAL
Eos, Fluid: 0 %
Lymphs, Fluid: 51 %
Monocyte-Macrophage-Serous Fluid: 22 % — ABNORMAL LOW (ref 50–90)
Neutrophil Count, Fluid: 27 % — ABNORMAL HIGH (ref 0–25)
Total Nucleated Cell Count, Fluid: 138 cu mm (ref 0–1000)

## 2022-05-02 LAB — PROTEIN, PLEURAL OR PERITONEAL FLUID: Total protein, fluid: <3 g/dL

## 2022-05-02 LAB — ALBUMIN, PLEURAL OR PERITONEAL FLUID: Albumin, Fluid: <1.5 g/dL

## 2022-05-02 LAB — LACTATE DEHYDROGENASE, PLEURAL OR PERITONEAL FLUID: LD, Fluid: 67 U/L — ABNORMAL HIGH (ref 3–23)

## 2022-05-02 LAB — NA AND K (SODIUM & POTASSIUM), RAND UR
Potassium Urine: 23 mmol/L
Sodium, Ur: 140 mmol/L

## 2022-05-02 LAB — TSH: TSH: 8.871 u[IU]/mL — ABNORMAL HIGH (ref 0.350–4.500)

## 2022-05-02 MED ORDER — ORAL CARE MOUTH RINSE: 15.0000 mL | OROMUCOSAL | Status: DC | PRN

## 2022-05-02 MED ORDER — APIXABAN 2.5 MG PO TABS
2.5000 mg | ORAL_TABLET | Freq: Two times a day (BID) | ORAL | Status: DC
  Administered 2022-05-02 – 2022-05-03 (×2): 2.5 mg via ORAL
  Filled 2022-05-02 (×3): qty 1

## 2022-05-02 MED ORDER — MAGNESIUM HYDROXIDE 400 MG/5ML PO SUSP
30.0000 mL | Freq: Every day | ORAL | Status: DC
  Administered 2022-05-02: 30 mL via ORAL
  Filled 2022-05-02: qty 30

## 2022-05-02 MED ORDER — LIDOCAINE HCL 1 % IJ SOLN
INTRAMUSCULAR | Status: AC
  Administered 2022-05-02: 10 mL
  Filled 2022-05-02: qty 20

## 2022-05-02 MED ORDER — BISACODYL 10 MG RE SUPP
10.0000 mg | Freq: Once | RECTAL | Status: DC
  Filled 2022-05-02 (×2): qty 1

## 2022-05-02 NOTE — Evaluation (Signed)
Physical Therapy Evaluation and Discharge Patient Details Name: Laurie James MRN: [id number] DOB: 07/26/1945 Today's Date: 05/02/2022  History of Present Illness  77 y.o. Caucasian female admitted 1/22 with acute onset of nausea and vomiting with associated left lower quadrant abdominal pain. s/p Right-sided thoracentesis for symptom relief with 700 mL transudate drained.PMHx: COPD, depression, paroxysmal atrial fibrillation and low back pain and bilateral blindness who presented to the emergency room  Clinical Impression  Patient evaluated by Physical Therapy with no further acute PT needs identified. All education has been completed and the patient has no further questions. Patient and son report she is back to baseline with mobility. Relatively sedentary at baseline but independent with ADLs, she does not walk further than room-to-room at home. Has RW at home but does not use; declined use today and required min assist to ambulate in hallway due to festinating gait with anterior LOB. SpO2 97% on RA while ambulating, HR in 70s-80s throughout, denies SOB. Declines any further PT services during admission and no HHPT follow-up. Eager to return home. Has 24/7 supervision available. Pt would in fact benefit from OPPT and RW use however patient unlikely to follow-up. PT is signing off. Thank you for this referral.        Recommendations for follow up therapy are one component of a multi-disciplinary discharge planning process, led by the attending physician.  Recommendations may be updated based on patient status, additional functional criteria and insurance authorization.  Follow Up Recommendations Outpatient PT (For balance and gait; declined HHPT)      Assistance Recommended at Discharge Intermittent Supervision/Assistance  Patient can return home with the following  A little help with walking and/or transfers;Assistance with cooking/housework;Assist for transportation;Help with  stairs or ramp for entrance    Equipment Recommendations None recommended by PT  Recommendations for Other Services       Functional Status Assessment Patient has had a recent decline in their functional status and demonstrates the ability to make significant improvements in function in a reasonable and predictable amount of time.     Precautions / Restrictions Precautions Precautions: Fall Restrictions Weight Bearing Restrictions: No      Mobility  Bed Mobility Overal bed mobility: Modified Independent             General bed mobility comments: extra time    Transfers Overall transfer level: Needs assistance Equipment used: None Transfers: Sit to/from Stand Sit to Stand: Supervision           General transfer comment: Supervision for safety, effortful to rise but preformed without physical assist from low bed setting.    Ambulation/Gait Ambulation/Gait assistance: Min assist Gait Distance (Feet): 100 Feet Assistive device: 1 person hand held assist Gait Pattern/deviations: Step-through pattern, Festinating Gait velocity: decr Gait velocity interpretation: 1.31 - 2.62 ft/sec, indicative of limited community ambulator   General Gait Details: Initially ambulating without assist, as she fatigued required up to min assist for balance and hand held support.. Cues for awareness of festinating gait and for upright posture as tolerated. Due to leaning forward so heavily pt momentum acellerates and she has a difficult time self correcting. Family reports baseline and that she typically only walks room to room at home. Would greatly benefit from RW use but declines.  Stairs            Wheelchair Mobility    Modified Rankin (Stroke Patients Only)       Balance Overall balance assessment: Mild deficits observed, not formally  tested                                           Pertinent Vitals/Pain Pain Assessment Pain Assessment: No/denies  pain    Home Living Family/patient expects to be discharged to:: Private residence Living Arrangements: Spouse/significant other Available Help at Discharge: Family;Available 24 hours/day Type of Home: House Home Access: Stairs to enter Entrance Stairs-Rails: Right;Left (Hold column/ post) Entrance Stairs-Number of Steps: 2 Alternate Level Stairs-Number of Steps: flight Home Layout: Two level;Able to live on main level with bedroom/bathroom Home Equipment: Rolling Walker (2 wheels);Cane - single point;Shower seat;Wheelchair - manual      Prior Function Prior Level of Function : Independent/Modified Independent             Mobility Comments: reports ind, relatively sedentary household distances only       Hand Dominance   Dominant Hand: Right    Extremity/Trunk Assessment   Upper Extremity Assessment Upper Extremity Assessment: Defer to OT evaluation    Lower Extremity Assessment Lower Extremity Assessment: Generalized weakness    Cervical / Trunk Assessment Cervical / Trunk Assessment: Kyphotic (Severe)  Communication   Communication: No difficulties  Cognition Arousal/Alertness: Awake/alert Behavior During Therapy: WFL for tasks assessed/performed Overall Cognitive Status: Within Functional Limits for tasks assessed                                          General Comments General comments (skin integrity, edema, etc.): Educated on awareness of balance deficits, safety, and findings. SpO2 97% on RA throughout session while ambualting. HR in 70s-80s. Denies SOB    Exercises     Assessment/Plan    PT Assessment Patient does not need any further PT services  PT Problem List         PT Treatment Interventions      PT Goals (Current goals can be found in the Care Plan section)  Acute Rehab PT Goals Patient Stated Goal: Go home as soon as possible PT Goal Formulation: All assessment and education complete, DC therapy    Frequency        Co-evaluation               AM-PAC PT "6 Clicks" Mobility  Outcome Measure Help needed turning from your back to your side while in a flat bed without using bedrails?: None Help needed moving from lying on your back to sitting on the side of a flat bed without using bedrails?: None Help needed moving to and from a bed to a chair (including a wheelchair)?: A Little Help needed standing up from a chair using your arms (e.g., wheelchair or bedside chair)?: A Little Help needed to walk in hospital room?: A Little Help needed climbing 3-5 steps with a railing? : A Little 6 Click Score: 20    End of Session Equipment Utilized During Treatment: Gait belt Activity Tolerance: Patient tolerated treatment well Patient left: in bed;with call bell/phone within reach;with bed alarm set;with family/visitor present   PT Visit Diagnosis: Unsteadiness on feet (R26.81);Other abnormalities of gait and mobility (R26.89);Muscle weakness (generalized) (M62.81);Difficulty in walking, not elsewhere classified (R26.2)    Time: 1440-1500 PT Time Calculation (min) (ACUTE ONLY): 20 min   Charges:   PT Evaluation $PT Eval Low Complexity: 1  Low          Kathlyn [place], PT, DPT Physical Therapist Acute Rehabilitation Services [facility] Outpatient Rehabilitation Services [facility]   Berton Mount 05/02/2022, 3:21 PM

## 2022-05-02 NOTE — ED Notes (Signed)
Pt OTF in IR. 

## 2022-05-02 NOTE — Assessment & Plan Note (Signed)
-  We will continue colchicine. 

## 2022-05-02 NOTE — Progress Notes (Signed)
Patient seen and examined.  She was in the emergency room.  Her son was at the bedside.  Admitted early morning hours by nighttime hospitalist with the following.  77 year old with history of COPD, depression, paroxysmal A-fib, bilateral blindness, has history of combined heart failure and chronic dyspnea presented with acute onset of nausea vomiting and left lower quadrant abdominal pain since Friday.  In the emergency room she was on room air.  Abdominal and pelvic CT scan showed moderate-sized right pleural effusion, small size left pleural effusion, large stool burden within the cecum and proximal to mid ascending colon.  Chest x-ray with pleural effusion but no acute findings.  Initially given IV fluid boluses, potassium was replaced.  Patient was admitted to the hospital.  Nausea vomiting and abdominal pain: Likely due to constipation.  Dose of milk of magnesia, Dulcolax suppository now.  If no improvement, will use lactulose.  She is usually constipated and last bowel movement was 3 days ago.  Gradually improving.  Allow regular diet and encourage oral intake of liquids.  Will discontinue IV fluids due to below.  Acute on chronic combined heart failure: Suspected.  Patient known ejection fraction 40 to 45%.  She is chronically dyspneic. Right-sided thoracentesis for symptom relief.  700 mL transudate drained. Continue IV Lasix.  Repeat echocardiogram today.  Work with PT OT.  Likely home with home health tomorrow.   Total time spent: 35 minutes.  Same-day admit.  No charge visit.  Patient's son updated at the bedside.

## 2022-05-02 NOTE — TOC Initial Note (Signed)
Transition of Care (TOC) - Initial/Assessment Note   Spoke to patient and son at bedside. PT recommended OP PT ( patient declined HHPT) . NCM explained HHPT and OP PT. Patient declined both .   NCM explained if she changes her mind prior to discharge NCM can arrange, if she changes her mind after discharge PCP can arrange. Patient and son voiced understanding.   Patient has family assistance at home. Patient has walker at home  Patient Details  Name: Laurie James MRN: [id number] Date of Birth: Mar 13, 1946  Transition of Care [facility]) CM/SW Contact:    Marilu Favre, RN Phone Number: 05/02/2022, 4:07 PM  Clinical Narrative:                   Expected Discharge Plan: Home/Self Care Barriers to Discharge: Continued Medical Work up   Patient Goals and CMS Choice Patient states their goals for this hospitalization and ongoing recovery are:: to return to home          Expected Discharge Plan and Services   Discharge Planning Services: CM Consult   Living arrangements for the past 2 months: Single Family Home                 DME Arranged: N/A DME Agency: NA       HH Arranged: Patient Refused HH          Prior Living Arrangements/Services Living arrangements for the past 2 months: Single Family Home Lives with:: Spouse Patient language and need for interpreter reviewed:: Yes Do you feel safe going back to the place where you live?: Yes      Need for Family Participation in Patient Care: Yes (Comment) Care giver support system in place?: Yes (comment) Current home services: DME Criminal Activity/Legal Involvement Pertinent to Current Situation/Hospitalization: No - Comment as needed  Activities of Daily Living Home Assistive Devices/Equipment: Dentures (specify type), Hearing aid (upper and lower dentures at home) ADL Screening (condition at time of admission) Patient's cognitive ability adequate to safely complete daily activities?: Yes Is the  patient deaf or have difficulty hearing?: No Does the patient have difficulty seeing, even when wearing glasses/contacts?: Yes Does the patient have difficulty concentrating, remembering, or making decisions?: No Patient able to express need for assistance with ADLs?: Yes Does the patient have difficulty dressing or bathing?: No Independently performs ADLs?: Yes (appropriate for developmental age) Does the patient have difficulty walking or climbing stairs?: No Weakness of Legs: None Weakness of Arms/Hands: None  Permission Sought/Granted   Permission granted to share information with : Yes, Verbal Permission Granted  Share Information with NAME: son Laurie James           Emotional Assessment Appearance:: Appears stated age Attitude/Demeanor/Rapport: Engaged Affect (typically observed): Calm Orientation: : Oriented to Self, Oriented to Place, Oriented to  Time, Oriented to Situation Alcohol / Substance Use: Not Applicable Psych Involvement: No (comment)  Admission diagnosis:  Pleural effusion [J90] Pleural effusion on left [J90] Abdominal pain, unspecified abdominal location [R10.9] Patient Active Problem List   Diagnosis Date Noted   GERD without esophagitis 05/02/2022   Gout 05/02/2022   Left lower quadrant abdominal pain 05/02/2022   Elevated LFTs 05/02/2022   Hyponatremia 05/02/2022   Pleural effusion on left 05/01/2022   Acute on chronic combined systolic and diastolic CHF (congestive heart failure) ([place]) 05/01/2022   Dyslipidemia 05/01/2022   Paroxysmal atrial fibrillation ([place]) 05/01/2022   Secondary hypercoagulable state ([place]) 04/04/2022   Acute on chronic systolic (  congestive) heart failure ([place]) 11/28/2021   Paroxysmal atrial fibrillation with RVR ([place]) 11/28/2021   Acute kidney injury superimposed on chronic kidney disease ([place]) 11/28/2021   Abnormal chest x-ray 11/28/2021   Macular degeneration 11/28/2021   MCI (mild cognitive impairment) 11/28/2021   DNR (do  not resuscitate) 11/28/2021   OSA (obstructive sleep apnea)    Protein-calorie malnutrition, severe 10/22/2020   Depression    Acute on chronic diastolic CHF (congestive heart failure) ([place])    Bilateral pleural effusion 10/19/2020   Atrial fibrillation ([place]) 10/11/2020   Pulmonary HTN ([place]) 10/11/2020   Tricuspid regurgitation 10/11/2020   HLD (hyperlipidemia) 10/11/2020   Hypotension 10/11/2020   Hypokalemia 10/11/2020   Chest pain at rest 10/05/2020   CAD (coronary artery disease) 04/24/2014   COPD (chronic obstructive pulmonary disease) ([place]) 04/24/2014   PCP:  Wenda Low, MD Pharmacy:   [facility] [address], [place] [address] Phone: [phone] Fax: [phone]     Social Determinants of Health (SDOH) Social History: SDOH Screenings   Food Insecurity: No Food Insecurity (05/02/2022)  Housing: Low Risk  (05/02/2022)  Transportation Needs: No Transportation Needs (05/02/2022)  Utilities: Not At Risk (05/02/2022)  Tobacco Use: Medium Risk (05/02/2022)   SDOH Interventions:     Readmission Risk Interventions     No data to display

## 2022-05-02 NOTE — Evaluation (Signed)
Occupational Therapy Evaluation and Discharge Patient Details Name: Laurie James MRN: [id number] DOB: [birth year] Today's Date: 05/02/2022   History of Present Illness 77 y.o. Caucasian female admitted 1/22 with acute onset of nausea and vomiting with associated left lower quadrant abdominal pain. s/p Right-sided thoracentesis for symptom relief with 700 mL transudate drained.PMHx: COPD, depression, paroxysmal atrial fibrillation and low back pain and bilateral blindness who presented to the emergency room   Clinical Impression   This 77 yo female admitted with above presents to acute OT in speaking to her and her son about PLOF and what she is able to do now as well as A she has at home and equipment available to her at home that she is back to her baseline and they do not foresee any issues with her getting back to her normal routine in an environment she is familiar with. No further OT needs post evaluation, we will D/C from acute OT without recommendation for follow up. Did make them aware if pt did start having issues with basics/mobility that her primary care doctor can write an order for OT and/or PT therapy.     Recommendations for follow up therapy are one component of a multi-disciplinary discharge planning process, led by the attending physician.  Recommendations may be updated based on patient status, additional functional criteria and insurance authorization.   Follow Up Recommendations  No OT follow up     Assistance Recommended at Discharge PRN     Functional Status Assessment  Patient has not had a recent decline in their functional status (pt and son report pt back at baseline)  Equipment Recommendations  None recommended by OT       Precautions / Restrictions Precautions Precautions: Fall Precaution Comments: blind from macular degeneration (can see shapes and tell light from dark, but no details) Restrictions Weight Bearing Restrictions: No              ADL either performed or assessed with clinical judgement   ADL                                         General ADL Comments: She and son report that they do not foresee any issues with basic ADLs once she is back at home where she knows her surroundings/familiar where everything is. Also reports walks minimal distances at home so the increasing gait with head forward posterior that was seen with PT does no happen at home. Made them aware they might want to contact services for the blind to see if there was anything they had to offer to the patient. Pt reports she uses Alexa at home to find out information she wants to know.     Vision Baseline Vision/History: 6 Macular Degeneration Ability to See in Adequate Light: 4 Severely impaired Additional Comments: Can only see shapes (not details), can tell light from dark            Pertinent Vitals/Pain Pain Assessment Pain Assessment: No/denies pain     Hand Dominance Right   Extremity/Trunk Assessment Upper Extremity Assessment Upper Extremity Assessment: Overall WFL for tasks assessed      Communication Communication Communication: No difficulties   Cognition Arousal/Alertness: Awake/alert Behavior During Therapy: WFL for tasks assessed/performed Overall Cognitive Status: Within Functional Limits for tasks assessed  Home Living Family/patient expects to be discharged to:: Private residence Living Arrangements: Spouse/significant other Available Help at Discharge: Family;Available 24 hours/day Type of Home: House Home Access: Stairs to enter [employer] of Steps: 2 Entrance Stairs-Rails: Right;Left Home Layout: Two level;Able to live on main level with bedroom/bathroom Alternate Level Stairs-Number of Steps: flight Alternate Level Stairs-Rails: Left Bathroom Shower/Tub: Walk-in Psychologist, prison and probation services: Standard      Home Equipment: Conservation officer, nature (2 wheels);Cane - single point;Shower seat;Wheelchair - manual          Prior Functioning/Environment Prior Level of Function : Independent/Modified Independent             Mobility Comments: reports ind, relatively sedentary household distances only ADLs Comments: reports she can do all of her basic ADLs but her husband does A with dressing just because it is easier        OT Problem List: Impaired vision/perception         OT Goals(Current goals can be found in the care plan section) Acute Rehab OT Goals Patient Stated Goal: to go home tomorrow OT Goal Formulation: With patient/family         AM-PAC OT "6 Clicks" Daily Activity     Outcome Measure Help from another person eating meals?: None Help from another person taking care of personal grooming?: None Help from another person toileting, which includes using toliet, bedpan, or urinal?: None Help from another person bathing (including washing, rinsing, drying)?: None Help from another person to put on and taking off regular upper body clothing?: None Help from another person to put on and taking off regular lower body clothing?: None 6 Click Score: 24   End of Session    Activity Tolerance: Patient tolerated treatment well Patient left: in bed;with call bell/phone within reach;with family/visitor present  OT Visit Diagnosis: Low vision, both eyes (H54.2)                Time: 3474-2595 OT Time Calculation (min): 11 min Charges:  OT General Charges $OT Visit: 1 Visit OT Evaluation $OT Eval Low Complexity: 1 Low  [address], OTR/L [facility] Re[facility] [phone] Office [phone]    Almon Register 05/02/2022, 4:09 PM

## 2022-05-02 NOTE — Assessment & Plan Note (Signed)
-  This could be related to congestive hepatopathy. - We will follow LFTs with diuresis.

## 2022-05-02 NOTE — Assessment & Plan Note (Signed)
-  We will continue PPI therapy.

## 2022-05-02 NOTE — ED Notes (Signed)
ED TO INPATIENT HANDOFF REPORT  ED Nurse Name and Phone #: [phone]  S Name/Age/Gender Laurie James 77 y.o. female Room/Bed: 042C/042C  Code Status   Code Status: Full Code  Home/SNF/Other Home Patient oriented to: self, place, time, and situation Is this baseline? Yes   Triage Complete: Triage complete  Chief Complaint Pleural effusion on left [J90]  Triage Note PT arrived from home with c/o n/v since Friday at 59. Pt has lower abdominal pain. 4mg  Zofran given en route.   Allergies Allergies  Allergen Reactions   Codeine Nausea And Vomiting and Other (See Comments)    Upsets the stomach    Level of Care/Admitting Diagnosis ED Disposition     ED Disposition  Admit   Condition  --   Comment  Hospital Area: [facility] [[id number]]  Level of Care: Telemetry Medical [104]  May admit patient to [facility] or [facility] if equivalent level of care is available:: No  Covid Evaluation: Asymptomatic - no recent exposure (last 10 days) testing not required  Diagnosis: Pleural effusion on left [176160]  Admitting Physician: Christel Mormon [[id number]]  Attending Physician: Christel Mormon [[id number]]  Certification:: I certify this patient will need inpatient services for at least 2 midnights  Estimated Length of Stay: 2          B Medical/Surgery History Past Medical History:  Diagnosis Date   AMD (age related macular degeneration)    COPD (chronic obstructive pulmonary disease) ([place])    Depression    Hiatal hernia    Intermittent low back pain    Memory impairment    Midsternal chest pain    a. 12/2011 Cardiac CTA Ca++ score of 103.3 (80th %), LAD <50p/m, RCA 50-75.   Osteoporosis    PAF (paroxysmal atrial fibrillation) ([place]) 09/2020   Vitamin B 12 deficiency    Past Surgical History:  Procedure Laterality Date   ABDOMINAL HYSTERECTOMY     ATRIAL FIBRILLATION ABLATION N/A 03/08/2022   Procedure: ATRIAL FIBRILLATION ABLATION;   Surgeon: Melida Quitter, MD;  Location: [facility];  Service: Cardiovascular;  Laterality: N/A;   IR THORACENTESIS ASP PLEURAL SPACE W/IMG GUIDE  10/20/2020   IR THORACENTESIS ASP PLEURAL SPACE W/IMG GUIDE  05/02/2022   RIGHT/LEFT HEART CATH AND CORONARY ANGIOGRAPHY N/A 10/07/2020   Procedure: RIGHT/LEFT HEART CATH AND CORONARY ANGIOGRAPHY;  Surgeon: Lorretta Harp, MD;  Location: [facility];  Service: Cardiovascular;  Laterality: N/A;   TEE WITHOUT CARDIOVERSION N/A 10/25/2020   Procedure: TRANSESOPHAGEAL ECHOCARDIOGRAM (TEE);  Surgeon: Sueanne Margarita, MD;  Location: [facility];  Service: Cardiovascular;  Laterality: N/A;     A IV Location/Drains/Wounds Patient Lines/Drains/Airways Status     Active Line/Drains/Airways     Name Placement date Placement time Site Days   Peripheral IV 05/01/22 20 G Anterior;Left;Proximal Forearm 05/01/22  1632  Forearm  1   External Urinary Catheter 05/01/22  2300  --  1   Wound / Incision (Open or Dehisced) 10/20/20 Skin tear Pretibial Left 3cm skin tear to left shin 10/20/20  1055  Pretibial  559            Intake/Output Last 24 hours No intake or output data in the 24 hours ending 05/02/22 1157  Labs/Imaging Results for orders placed or performed during the hospital encounter of 05/01/22 (from the past 48 hour(s))  Lipase, blood     Status: None   Collection Time: 05/01/22  4:35 PM  Result Value Ref Range   Lipase 28 11 - 51 U/L    Comment: Performed at [facility], [address], [place], [place] [address]  Comprehensive metabolic panel     Status: Abnormal   Collection Time: 05/01/22  4:35 PM  Result Value Ref Range   Sodium 133 (L) 135 - 145 mmol/L   Potassium 2.9 (L) 3.5 - 5.1 mmol/L   Chloride 107 98 - 111 mmol/L   CO2 19 (L) 22 - 32 mmol/L   Glucose, Bld 85 70 - 99 mg/dL    Comment: Glucose reference range applies only to samples taken after fasting for at least 8 hours.   BUN 16 8 - 23 mg/dL    Creatinine, Ser 9.56 0.44 - 1.00 mg/dL   Calcium 6.5 (L) 8.9 - 10.3 mg/dL   Total Protein 5.5 (L) 6.5 - 8.1 g/dL   Albumin 2.2 (L) 3.5 - 5.0 g/dL   AST 387 (H) 15 - 41 U/L   ALT 205 (H) 0 - 44 U/L   Alkaline Phosphatase 106 38 - 126 U/L   Total Bilirubin 1.2 0.3 - 1.2 mg/dL   GFR, Estimated 60 (L) >60 mL/min    Comment: (NOTE) Calculated using the CKD-EPI Creatinine Equation (2021)    Anion gap 7 5 - 15    Comment: Performed at [facility], [address] [place], [place] [address]  CBC     Status: Abnormal   Collection Time: 05/01/22  4:35 PM  Result Value Ref Range   WBC 9.3 4.0 - 10.5 K/uL   RBC 3.02 (L) 3.87 - 5.11 MIL/uL   Hemoglobin 8.8 (L) 12.0 - 15.0 g/dL   HCT 29.5 (L) 18.8 - 41.6 %   MCV 92.4 80.0 - 100.0 fL   MCH 29.1 26.0 - 34.0 pg   MCHC 31.5 30.0 - 36.0 g/dL   RDW 60.6 (H) 30.1 - 60.1 %   Platelets 324 150 - 400 K/uL   nRBC 0.0 0.0 - 0.2 %    Comment: Performed at [facility], [address], [place], [place] [address]  Brain natriuretic peptide     Status: Abnormal   Collection Time: 05/01/22  4:35 PM  Result Value Ref Range   B Natriuretic Peptide 700.0 (H) 0.0 - 100.0 pg/mL    Comment: Performed at [facility], [address] [place], [place] [address]  Comprehensive metabolic panel     Status: Abnormal   Collection Time: 05/01/22  6:15 PM  Result Value Ref Range   Sodium 129 (L) 135 - 145 mmol/L   Potassium 3.9 3.5 - 5.1 mmol/L   Chloride 100 98 - 111 mmol/L   CO2 20 (L) 22 - 32 mmol/L   Glucose, Bld 95 70 - 99 mg/dL    Comment: Glucose reference range applies only to samples taken after fasting for at least 8 hours.   BUN 17 8 - 23 mg/dL   Creatinine, Ser 2.02 (H) 0.44 - 1.00 mg/dL   Calcium 7.7 (L) 8.9 - 10.3 mg/dL   Total Protein 6.4 (L) 6.5 - 8.1 g/dL   Albumin 2.6 (L) 3.5 - 5.0 g/dL   AST 542 (H) 15 - 41 U/L   ALT 243 (H) 0 - 44 U/L   Alkaline Phosphatase 111 38 - 126 U/L   Total Bilirubin 1.4 (H) 0.3 - 1.2 mg/dL   GFR,  Estimated 56 (L) >60 mL/min    Comment: (NOTE) Calculated using  the CKD-EPI Creatinine Equation (2021)    Anion gap 9 5 - 15    Comment: Performed at [facility], [address], [place], [place] [address]  Magnesium     Status: None   Collection Time: 05/01/22  6:15 PM  Result Value Ref Range   Magnesium 1.8 1.7 - 2.4 mg/dL    Comment: Performed at [facility], [address] [place], [place] [address]  Troponin I (High Sensitivity)     Status: Abnormal   Collection Time: 05/01/22  6:15 PM  Result Value Ref Range   Troponin I (High Sensitivity) 26 (H) <18 ng/L    Comment: (NOTE) Elevated high sensitivity troponin I (hsTnI) values and significant  changes across serial measurements may suggest ACS but many other  chronic and acute conditions are known to elevate hsTnI results.  Refer to the "Links" section for chest pain algorithms and additional  guidance. Performed at [facility], [address] [place], [place] [address]   Urinalysis, Routine w reflex microscopic Urine, Clean Catch     Status: Abnormal   Collection Time: 05/01/22  7:22 PM  Result Value Ref Range   Color, Urine YELLOW YELLOW   APPearance HAZY (A) CLEAR   Specific Gravity, Urine 1.029 1.005 - 1.030   pH 5.0 5.0 - 8.0   Glucose, UA 50 (A) NEGATIVE mg/dL   Hgb urine dipstick SMALL (A) NEGATIVE   Bilirubin Urine NEGATIVE NEGATIVE   Ketones, ur NEGATIVE NEGATIVE mg/dL   Protein, ur 294 (A) NEGATIVE mg/dL   Nitrite NEGATIVE NEGATIVE   Leukocytes,Ua NEGATIVE NEGATIVE   RBC / HPF 0-5 0 - 5 RBC/hpf   WBC, UA 0-5 0 - 5 WBC/hpf   Bacteria, UA NONE SEEN NONE SEEN   Squamous Epithelial / HPF 0-5 0 - 5 /HPF   Mucus PRESENT     Comment: Performed at [facility], [address], [place], [place] [address]  Troponin I (High Sensitivity)     Status: Abnormal   Collection Time: 05/01/22  7:55 PM  Result Value Ref Range   Troponin I (High Sensitivity) 21 (H) <18 ng/L    Comment:  (NOTE) Elevated high sensitivity troponin I (hsTnI) values and significant  changes across serial measurements may suggest ACS but many other  chronic and acute conditions are known to elevate hsTnI results.  Refer to the "Links" section for chest pain algorithms and additional  guidance. Performed at [facility], [address] [place], [place] [address]   TSH     Status: Abnormal   Collection Time: 05/01/22  7:55 PM  Result Value Ref Range   TSH 8.871 (H) 0.350 - 4.500 uIU/mL    Comment: Performed by a 3rd Generation assay with a functional sensitivity of <=0.01 uIU/mL. Performed at [facility], [address], [place], [place] [address]   Basic metabolic panel     Status: Abnormal   Collection Time: 05/02/22  4:25 AM  Result Value Ref Range   Sodium 131 (L) 135 - 145 mmol/L   Potassium 3.8 3.5 - 5.1 mmol/L   Chloride 101 98 - 111 mmol/L   CO2 20 (L) 22 - 32 mmol/L   Glucose, Bld 99 70 - 99 mg/dL    Comment: Glucose reference range applies only to samples taken after fasting for at least 8 hours.   BUN 16 8 - 23 mg/dL   Creatinine, Ser 2.75 (H) 0.44 - 1.00  mg/dL   Calcium 8.0 (L) 8.9 - 10.3 mg/dL   GFR, Estimated 54 (L) >60 mL/min    Comment: (NOTE) Calculated using the CKD-EPI Creatinine Equation (2021)    Anion gap 10 5 - 15    Comment: Performed at [facility], [address], [place], [place] [address]  CBC     Status: Abnormal   Collection Time: 05/02/22  4:25 AM  Result Value Ref Range   WBC 8.0 4.0 - 10.5 K/uL   RBC 3.11 (L) 3.87 - 5.11 MIL/uL   Hemoglobin 9.1 (L) 12.0 - 15.0 g/dL   HCT 97.3 (L) 53.2 - 99.2 %   MCV 90.7 80.0 - 100.0 fL   MCH 29.3 26.0 - 34.0 pg   MCHC 32.3 30.0 - 36.0 g/dL   RDW 42.6 (H) 83.4 - 19.6 %   Platelets 331 150 - 400 K/uL   nRBC 0.0 0.0 - 0.2 %    Comment: Performed at [facility], [address], [place], [place] [address]   IR THORACENTESIS ASP PLEURAL SPACE W/IMG GUIDE  Result Date:  05/02/2022 INDICATION: Shortness of breath. Right-sided pleural effusion. Request for diagnostic and therapeutic thoracentesis. EXAM: ULTRASOUND GUIDED RIGHT THORACENTESIS MEDICATIONS: 1% plain lidocaine, 5 mL COMPLICATIONS: None immediate. PROCEDURE: An ultrasound guided thoracentesis was thoroughly discussed with the patient and questions answered. The benefits, risks, alternatives and complications were also discussed. The patient understands and wishes to proceed with the procedure. Written consent was obtained. Ultrasound was performed to localize and mark an adequate pocket of fluid in the right chest. The area was then prepped and draped in the normal sterile fashion. 1% Lidocaine was used for local anesthesia. Under ultrasound guidance a 6 Fr Safe-T-Centesis catheter was introduced. Thoracentesis was performed. The catheter was removed and a dressing applied. FINDINGS: A total of approximately 700 mL of clear yellow fluid was removed. Samples were sent to the laboratory as requested by the clinical team. IMPRESSION: Successful ultrasound guided right thoracentesis yielding 700 mL of pleural fluid. Read by: Brayton El PA-C Electronically Signed   By: Judie Petit.  Shick M.D.   On: 05/02/2022 10:23   DG Chest 1 View  Result Date: 05/02/2022 CLINICAL DATA:  Status post right thoracentesis. EXAM: CHEST  1 VIEW COMPARISON:  May 01, 2022. FINDINGS: No pneumothorax status post right thoracentesis. No significant right pleural effusion is noted. IMPRESSION: No pneumothorax status post right thoracentesis. Electronically Signed   By: Lupita Raider M.D.   On: 05/02/2022 09:52   DG Chest 2 View  Result Date: 05/01/2022 CLINICAL DATA:  Pleural effusion vomiting EXAM: CHEST - 2 VIEW COMPARISON:  11/28/2021, CT 03/30/2022 FINDINGS: Moderate right and small moderate left pleural effusions. Emphysema. Airspace disease at both lung bases. Borderline cardiomegaly with aortic atherosclerosis. Pleuroparenchymal scarring  at the apices. No pneumothorax IMPRESSION: Moderate right and small-to-moderate left pleural effusions with airspace disease at both lung bases. Emphysema Electronically Signed   By: Jasmine Pang M.D.   On: 05/01/2022 20:40   CT ABDOMEN PELVIS W CONTRAST  Result Date: 05/01/2022 CLINICAL DATA:  Abdominal pain. EXAM: CT ABDOMEN AND PELVIS WITH CONTRAST TECHNIQUE: Multidetector CT imaging of the abdomen and pelvis was performed using the standard protocol following bolus administration of intravenous contrast. RADIATION DOSE REDUCTION: This exam was performed according to the departmental dose-optimization program which includes automated exposure control, adjustment of the mA and/or kV according to patient size and/or use of iterative reconstruction technique. CONTRAST:  88mL OMNIPAQUE IOHEXOL 350  MG/ML SOLN COMPARISON:  March 31, 2022 FINDINGS: Lower chest: Mild fibrotic and atelectatic changes are seen within the bilateral lung bases. There is a moderate sized right pleural effusion. A small left pleural effusion is also seen. Hepatobiliary: No focal liver abnormality is seen. A mild amount increased attenuation is noted within the dependent portion of a moderately distended gallbladder. There is no evidence of gallbladder wall thickening or biliary dilatation. Pancreas: Unremarkable. No pancreatic ductal dilatation or surrounding inflammatory changes. Spleen: Normal in size without focal abnormality. Adrenals/Urinary Tract: The right adrenal gland is poorly visualized. The left adrenal gland is slightly nodular in appearance. Kidneys are normal in size, without focal lesions. There is predominantly stable moderate to marked severity dilatation of the right renal pelvis and intrarenal calices within the lower pole of the right kidney. No renal calculi are identified. Bladder is unremarkable. Stomach/Bowel: A small, stable hiatal hernia is seen. The appendix is not clearly identified. A large amount of stool  is seen within the cecum and proximal to mid ascending colon. No evidence of bowel wall thickening, distention, or inflammatory changes. Vascular/Lymphatic: Aortic atherosclerosis. No enlarged abdominal or pelvic lymph nodes. Reproductive: Status post hysterectomy. No adnexal masses. Other: No abdominal wall hernia or abnormality. No abdominopelvic ascites. Musculoskeletal: There is marked severity dextroscoliosis of the lumbar spine with marked severity multilevel degenerative changes also seen. IMPRESSION: 1. Moderate sized right pleural effusion. 2. Small left pleural effusion. 3. Predominantly stable dilatation of the right renal pelvis and intrarenal calices within the lower pole of the right kidney. 4. Small, stable hiatal hernia. 5. Large stool burden within the cecum and proximal to mid ascending colon. 6. Marked severity dextroscoliosis of the lumbar spine with marked severity multilevel degenerative changes. 7. Aortic atherosclerosis. Aortic Atherosclerosis (ICD10-I70.0). Electronically Signed   By: Virgina Norfolk M.D.   On: 05/01/2022 17:56    Pending Labs Unresulted Labs (From admission, onward)     Start     Ordered   05/01/22 2355  Na and K (sodium & potassium), rand urine  Add-on,   AD        05/01/22 2354   05/01/22 2355  Osmolality, urine  Add-on,   AD        05/01/22 2354   05/01/22 2355  Osmolality  Add-on,   AD        05/01/22 2354            Vitals/Pain Today's Vitals   05/02/22 0427 05/02/22 0630 05/02/22 0831 05/02/22 1015  BP: (!) 149/73 (!) 141/73  128/73  Pulse: (!) 59 (!) 58  61  Resp:  19  20  Temp: 98.7 F (37.1 C)  97.8 F (36.6 C)   TempSrc: Oral  Oral   SpO2: 98% 96%  99%  Weight:      Height:      PainSc: 0-No pain   0-No pain    Isolation Precautions No active isolations  Medications Medications  amiodarone (PACERONE) tablet 200 mg (200 mg Oral Given 05/02/22 1016)  atorvastatin (LIPITOR) tablet 40 mg (40 mg Oral Given 05/02/22 1016)   nitroGLYCERIN (NITROSTAT) SL tablet 0.4 mg (has no administration in time range)  pantoprazole (PROTONIX) EC tablet 40 mg (40 mg Oral Given 05/02/22 1017)  cyanocobalamin (VITAMIN B12) injection 1,000 mcg (has no administration in time range)  albuterol (PROVENTIL) (2.5 MG/3ML) 0.083% nebulizer solution 3 mL (has no administration in time range)  enoxaparin (LOVENOX) injection 30 mg (30 mg Subcutaneous Given 05/02/22 1016)  acetaminophen (TYLENOL) tablet 650 mg (has no administration in time range)    Or  acetaminophen (TYLENOL) suppository 650 mg (has no administration in time range)  traZODone (DESYREL) tablet 25 mg (has no administration in time range)  ondansetron (ZOFRAN) tablet 4 mg ( Oral See Alternative 05/02/22 1022)    Or  ondansetron (ZOFRAN) injection 4 mg (4 mg Intravenous Given 05/02/22 1022)  furosemide (LASIX) injection 40 mg (40 mg Intravenous Given 05/02/22 1017)  magnesium hydroxide (MILK OF MAGNESIA) suspension 30 mL (30 mLs Oral Given 05/02/22 1016)  ondansetron (ZOFRAN) injection 4 mg (4 mg Intravenous Given 05/01/22 2310)  sodium chloride 0.9 % bolus 1,000 mL (0 mLs Intravenous Stopped 05/01/22 1827)  iohexol (OMNIPAQUE) 350 MG/ML injection 75 mL (75 mLs Intravenous Contrast Given 05/01/22 1726)  potassium chloride 10 mEq in 100 mL IVPB (0 mEq Intravenous Stopped 05/01/22 1947)  lidocaine (XYLOCAINE) 1 % (with pres) injection (10 mLs  Given 05/02/22 0930)    Mobility walks with person assist     Focused Assessments    R Recommendations: See Admitting Provider Note  Report given to:   Additional Notes:

## 2022-05-02 NOTE — Assessment & Plan Note (Signed)
-  Will obtain hyponatremia workup. - This is likely hypervolemic. - We will follow BMP with diuresis.

## 2022-05-02 NOTE — ED Notes (Signed)
Pt purewick was out of place and the pt and bed were soaking wet through brief and chux.  Suction was also found to be turned up way to far. Pt was cleaned, linens changed and purewick placed again.  It seems to need to be tucked under her a little bit to suck properly.

## 2022-05-02 NOTE — Progress Notes (Signed)
Heart Failure Navigator Progress Note  Assessed for Heart & Vascular TOC clinic readiness.  Patient does not meet criteria due to Advanced Heart Failure Team patient.   Navigator will sign off at this time.    Caitland Porchia, BSN, RN Heart Failure Nurse Navigator Secure Chat Only   

## 2022-05-02 NOTE — Assessment & Plan Note (Signed)
-  No acute findings on abdominal CT except for constipation. - We will manage her constipation. - This could be contributing to her nausea and vomiting.  She could also be having mild associated enteritis.

## 2022-05-02 NOTE — Assessment & Plan Note (Signed)
-  We will replace potassium and check magnesium level. 

## 2022-05-03 DIAGNOSIS — R1032 Left lower quadrant pain: Secondary | ICD-10-CM | POA: Diagnosis not present

## 2022-05-03 DIAGNOSIS — I5043 Acute on chronic combined systolic (congestive) and diastolic (congestive) heart failure: Secondary | ICD-10-CM | POA: Diagnosis not present

## 2022-05-03 LAB — BASIC METABOLIC PANEL
Anion gap: 9 (ref 5–15)
BUN: 14 mg/dL (ref 8–23)
CO2: 25 mmol/L (ref 22–32)
Calcium: 7.9 mg/dL — ABNORMAL LOW (ref 8.9–10.3)
Chloride: 97 mmol/L — ABNORMAL LOW (ref 98–111)
Creatinine, Ser: 1.27 mg/dL — ABNORMAL HIGH (ref 0.44–1.00)
GFR, Estimated: 44 mL/min — ABNORMAL LOW (ref >60)
Glucose, Bld: 88 mg/dL (ref 70–99)
Potassium: 3.2 mmol/L — ABNORMAL LOW (ref 3.5–5.1)
Sodium: 131 mmol/L — ABNORMAL LOW (ref 135–145)

## 2022-05-03 LAB — CBC WITH DIFFERENTIAL/PLATELET
Abs Immature Granulocytes: 0.03 10*3/uL (ref 0.00–0.07)
Basophils Absolute: 0 10*3/uL (ref 0.0–0.1)
Basophils Relative: 0 %
Eosinophils Absolute: 0.4 10*3/uL (ref 0.0–0.5)
Eosinophils Relative: 5 %
HCT: 26.7 % — ABNORMAL LOW (ref 36.0–46.0)
Hemoglobin: 9 g/dL — ABNORMAL LOW (ref 12.0–15.0)
Immature Granulocytes: 0 %
Lymphocytes Relative: 11 %
Lymphs Abs: 0.8 10*3/uL (ref 0.7–4.0)
MCH: 28.9 pg (ref 26.0–34.0)
MCHC: 33.7 g/dL (ref 30.0–36.0)
MCV: 85.9 fL (ref 80.0–100.0)
Monocytes Absolute: 0.7 10*3/uL (ref 0.1–1.0)
Monocytes Relative: 9 %
Neutro Abs: 5.7 10*3/uL (ref 1.7–7.7)
Neutrophils Relative %: 75 %
Platelets: 307 10*3/uL (ref 150–400)
RBC: 3.11 MIL/uL — ABNORMAL LOW (ref 3.87–5.11)
RDW: 16.3 % — ABNORMAL HIGH (ref 11.5–15.5)
WBC: 7.6 10*3/uL (ref 4.0–10.5)
nRBC: 0 % (ref 0.0–0.2)

## 2022-05-03 LAB — OSMOLALITY, URINE: Osmolality, Ur: 398 mOsm/kg (ref 300–900)

## 2022-05-03 LAB — MAGNESIUM: Magnesium: 2.1 mg/dL (ref 1.7–2.4)

## 2022-05-03 MED ORDER — FUROSEMIDE 40 MG PO TABS
40.0000 mg | ORAL_TABLET | Freq: Every day | ORAL | Status: DC
  Administered 2022-05-03: 40 mg via ORAL
  Filled 2022-05-03: qty 1

## 2022-05-03 MED ORDER — POTASSIUM CHLORIDE CRYS ER 20 MEQ PO TBCR
40.0000 meq | EXTENDED_RELEASE_TABLET | Freq: Two times a day (BID) | ORAL | Status: DC
  Administered 2022-05-03: 40 meq via ORAL
  Filled 2022-05-03: qty 2

## 2022-05-03 MED ORDER — POTASSIUM CHLORIDE CRYS ER 20 MEQ PO TBCR: 40.0000 meq | EXTENDED_RELEASE_TABLET | Freq: Every day | ORAL | 0 refills | Status: DC

## 2022-05-03 MED ORDER — CYANOCOBALAMIN 1000 MCG/ML IJ SOLN
1000.0000 ug | Freq: Once | INTRAMUSCULAR | Status: AC
  Administered 2022-05-03: 1000 ug via INTRAMUSCULAR
  Filled 2022-05-03: qty 1

## 2022-05-03 MED ORDER — POLYETHYLENE GLYCOL 3350 17 G PO PACK: 17.0000 g | PACK | Freq: Every day | ORAL | 0 refills | Status: DC

## 2022-05-03 MED ORDER — POLYETHYLENE GLYCOL 3350 17 G PO PACK
17.0000 g | PACK | Freq: Every day | ORAL | Status: DC
  Administered 2022-05-03: 17 g via ORAL
  Filled 2022-05-03: qty 1

## 2022-05-03 NOTE — Discharge Summary (Signed)
Physician Discharge Summary  Laurie James DQQ:[id number] DOB: September 16, 1945 DOA: 05/01/2022  PCP: Laurie Housekeeper, MD  Admit date: 05/01/2022 Discharge date: 05/03/2022  Admitted From: Home Disposition: Home with family,  Recommendations for Outpatient Follow-up:  Follow up with PCP in 1-2 weeks Please obtain BMP/CBC in one week Follow-up with cardiology as scheduled  Home Health: PT/OT suggested, patient declined Equipment/Devices: None  Discharge Condition: Fair CODE STATUS: Full code Diet recommendation: Low-salt diet  Discharge summary: 77 year old with history of COPD, depression, paroxysmal A-fib, bilateral blindness, has history of combined heart failure and chronic dyspnea presented with acute onset of nausea vomiting and left lower quadrant abdominal pain since Friday.  In the emergency room she was on room air.  Abdominal and pelvic CT scan showed moderate-sized right pleural effusion, small size left pleural effusion, large stool burden within the cecum and proximal to mid ascending colon.  Chest x-ray with pleural effusion but no acute findings.  Initially given IV fluid boluses, potassium was replaced.  Patient was admitted to the hospital for observation.   Nausea vomiting and abdominal pain: Likely due to constipation.  Asymptomatic today.  Received MiraLAX and Dulcolax suppository with multiple bowel movements.  Currently tolerating regular diet.  Advised dietary modification and laxative use and avoid constipation.   Acute on chronic combined heart failure: Suspected.  Patient known ejection fraction 40 to 45%.  She is chronically dyspneic. Underwent right-sided thoracentesis with transudate, 700 mL removed with much clinical improvement. Given a dose of IV Lasix.  She is euvolemic today. Ordered 2D echocardiogram, however patient did not want to wait for this as she has one scheduled next month with Dr. Gala Romney. She will resume her Lasix 20 mg daily mostly  takes as needed.  On atorvastatin.  Potassium replacement for next 3 days. Patient is not on beta-blockers or ACE inhibitors due to intolerance.  Paroxysmal atrial fibrillation: Sinus rhythm.  On amiodarone 200 mg twice daily, therapeutic on Eliquis.  Stable to discharge with family.  Declined home health therapies.   Discharge Diagnoses:  Active Problems:   Bilateral pleural effusion   Acute on chronic combined systolic and diastolic CHF (congestive heart failure) (HCC)   Left lower quadrant abdominal pain   Hypokalemia   Elevated LFTs   Hyponatremia   Dyslipidemia   Paroxysmal atrial fibrillation (HCC)   GERD without esophagitis   Gout    Discharge Instructions  Discharge Instructions     Diet - low sodium heart healthy   Complete by: As directed    Discharge instructions   Complete by: As directed    Take adequate laxatives and stool softeners to avoid constipation.   Increase activity slowly   Complete by: As directed       Allergies as of 05/03/2022       Reactions   Codeine Nausea And Vomiting, Other (See Comments)   Upsets the stomach        Medication List     STOP taking these medications    colchicine 0.6 MG tablet       TAKE these medications    albuterol 108 (90 Base) MCG/ACT inhaler Commonly known as: VENTOLIN HFA Inhale 2 puffs into the lungs every 4 (four) hours as needed for wheezing.   amiodarone 200 MG tablet Commonly known as: PACERONE Take 1 tablet (200 mg total) by mouth 2 (two) times daily.   apixaban 2.5 MG Tabs tablet Commonly known as: ELIQUIS Take 1 tablet (2.5 mg total) by mouth 2 (  two) times daily.   atorvastatin 40 MG tablet Commonly known as: LIPITOR Take 1 tablet by mouth once daily   cyanocobalamin 1000 MCG/ML injection Commonly known as: VITAMIN B12 Inject 1,000 mcg into the muscle every 30 (thirty) days.   furosemide 20 MG tablet Commonly known as: LASIX Take 1 tablet (20 mg total) by mouth as needed. Leg  edema   nitroGLYCERIN 0.4 MG SL tablet Commonly known as: NITROSTAT Place 1 tablet (0.4 mg total) under the tongue every 5 (five) minutes x 3 doses as needed for chest pain.   ondansetron 4 MG tablet Commonly known as: ZOFRAN Take 4 mg by mouth 3 (three) times daily as needed for nausea or vomiting.   pantoprazole 40 MG tablet Commonly known as: PROTONIX Take 1 tablet (40 mg total) by mouth 2 (two) times daily.   polyethylene glycol 17 g packet Commonly known as: MIRALAX / GLYCOLAX Take 17 g by mouth daily. Start taking on: May 04, 2022   potassium chloride SA 20 MEQ tablet Commonly known as: KLOR-CON M Take 2 tablets (40 mEq total) by mouth daily for 3 days.        Allergies  Allergen Reactions   Codeine Nausea And Vomiting and Other (See Comments)    Upsets the stomach    Consultations: None   Procedures/Studies: IR THORACENTESIS ASP PLEURAL SPACE W/IMG GUIDE  Result Date: 05/02/2022 INDICATION: Shortness of breath. Right-sided pleural effusion. Request for diagnostic and therapeutic thoracentesis. EXAM: ULTRASOUND GUIDED RIGHT THORACENTESIS MEDICATIONS: 1% plain lidocaine, 5 mL COMPLICATIONS: None immediate. PROCEDURE: An ultrasound guided thoracentesis was thoroughly discussed with the patient and questions answered. The benefits, risks, alternatives and complications were also discussed. The patient understands and wishes to proceed with the procedure. Written consent was obtained. Ultrasound was performed to localize and mark an adequate pocket of fluid in the right chest. The area was then prepped and draped in the normal sterile fashion. 1% Lidocaine was used for local anesthesia. Under ultrasound guidance a 6 Fr Safe-T-Centesis catheter was introduced. Thoracentesis was performed. The catheter was removed and a dressing applied. FINDINGS: A total of approximately 700 mL of clear yellow fluid was removed. Samples were sent to the laboratory as requested by the  clinical team. IMPRESSION: Successful ultrasound guided right thoracentesis yielding 700 mL of pleural fluid. Read by: Ascencion Dike PA-C Electronically Signed   By: Jerilynn Mages.  Shick M.D.   On: 05/02/2022 10:23   DG Chest 1 View  Result Date: 05/02/2022 CLINICAL DATA:  Status post right thoracentesis. EXAM: CHEST  1 VIEW COMPARISON:  May 01, 2022. FINDINGS: No pneumothorax status post right thoracentesis. No significant right pleural effusion is noted. IMPRESSION: No pneumothorax status post right thoracentesis. Electronically Signed   By: Marijo Conception M.D.   On: 05/02/2022 09:52   DG Chest 2 View  Result Date: 05/01/2022 CLINICAL DATA:  Pleural effusion vomiting EXAM: CHEST - 2 VIEW COMPARISON:  11/28/2021, CT 03/30/2022 FINDINGS: Moderate right and small moderate left pleural effusions. Emphysema. Airspace disease at both lung bases. Borderline cardiomegaly with aortic atherosclerosis. Pleuroparenchymal scarring at the apices. No pneumothorax IMPRESSION: Moderate right and small-to-moderate left pleural effusions with airspace disease at both lung bases. Emphysema Electronically Signed   By: Donavan Foil M.D.   On: 05/01/2022 20:40   CT ABDOMEN PELVIS W CONTRAST  Result Date: 05/01/2022 CLINICAL DATA:  Abdominal pain. EXAM: CT ABDOMEN AND PELVIS WITH CONTRAST TECHNIQUE: Multidetector CT imaging of the abdomen and pelvis was performed using the standard  protocol following bolus administration of intravenous contrast. RADIATION DOSE REDUCTION: This exam was performed according to the departmental dose-optimization program which includes automated exposure control, adjustment of the mA and/or kV according to patient size and/or use of iterative reconstruction technique. CONTRAST:  36mL OMNIPAQUE IOHEXOL 350 MG/ML SOLN COMPARISON:  March 31, 2022 FINDINGS: Lower chest: Mild fibrotic and atelectatic changes are seen within the bilateral lung bases. There is a moderate sized right pleural effusion. A  small left pleural effusion is also seen. Hepatobiliary: No focal liver abnormality is seen. A mild amount increased attenuation is noted within the dependent portion of a moderately distended gallbladder. There is no evidence of gallbladder wall thickening or biliary dilatation. Pancreas: Unremarkable. No pancreatic ductal dilatation or surrounding inflammatory changes. Spleen: Normal in size without focal abnormality. Adrenals/Urinary Tract: The right adrenal gland is poorly visualized. The left adrenal gland is slightly nodular in appearance. Kidneys are normal in size, without focal lesions. There is predominantly stable moderate to marked severity dilatation of the right renal pelvis and intrarenal calices within the lower pole of the right kidney. No renal calculi are identified. Bladder is unremarkable. Stomach/Bowel: A small, stable hiatal hernia is seen. The appendix is not clearly identified. A large amount of stool is seen within the cecum and proximal to mid ascending colon. No evidence of bowel wall thickening, distention, or inflammatory changes. Vascular/Lymphatic: Aortic atherosclerosis. No enlarged abdominal or pelvic lymph nodes. Reproductive: Status post hysterectomy. No adnexal masses. Other: No abdominal wall hernia or abnormality. No abdominopelvic ascites. Musculoskeletal: There is marked severity dextroscoliosis of the lumbar spine with marked severity multilevel degenerative changes also seen. IMPRESSION: 1. Moderate sized right pleural effusion. 2. Small left pleural effusion. 3. Predominantly stable dilatation of the right renal pelvis and intrarenal calices within the lower pole of the right kidney. 4. Small, stable hiatal hernia. 5. Large stool burden within the cecum and proximal to mid ascending colon. 6. Marked severity dextroscoliosis of the lumbar spine with marked severity multilevel degenerative changes. 7. Aortic atherosclerosis. Aortic Atherosclerosis (ICD10-I70.0).  Electronically Signed   By: Virgina Norfolk M.D.   On: 05/01/2022 17:56   (Echo, Carotid, EGD, Colonoscopy, ERCP)    Subjective: Patient seen in the morning rounds.  Granddaughter at the bedside.  Later she had bowel movements after MiraLAX.  Patient is eager to go home.  She tells me she feels fine and does not need to wait for echocardiogram.  She can do this as outpatient next month.  Patient tells me her breathing is much better after thoracentesis.   Discharge Exam: Vitals:   05/03/22 0452 05/03/22 0737  BP: 123/65 116/66  Pulse: 62 61  Resp: 16 16  Temp: 98.4 F (36.9 C) 98.6 F (37 C)  SpO2: 97% 98%   Vitals:   05/02/22 2023 05/03/22 0037 05/03/22 0452 05/03/22 0737  BP: (!) 104/55 126/70 123/65 116/66  Pulse: (!) 55 60 62 61  Resp: 18 18 16 16   Temp: 98.7 F (37.1 C) 98.5 F (36.9 C) 98.4 F (36.9 C) 98.6 F (37 C)  TempSrc: Oral Oral Oral Oral  SpO2: 99% 99% 97% 98%  Weight:      Height:        General: Pt is alert, awake, not in acute distress Looks comfortable.  Interactive.  Blind in both eyes with waving of hands only. Cardiovascular: RRR, S1/S2 +, no rubs, no gallops Respiratory: CTA bilaterally, no wheezing, no rhonchi Abdominal: Soft, NT, ND, bowel sounds + Extremities: no  edema, no cyanosis    The results of significant diagnostics from this hospitalization (including imaging, microbiology, ancillary and laboratory) are listed below for reference.     Microbiology: No results found for this or any previous visit (from the past 240 hour(s)).   Labs: BNP (last 3 results) Recent Labs    11/28/21 1157 12/30/21 1456 05/01/22 1635  BNP 1,930.1* 816.1* 700.0*   Basic Metabolic Panel: Recent Labs  Lab 05/01/22 1635 05/01/22 1815 05/02/22 0425 05/03/22 0119  NA 133* 129* 131* 131*  K 2.9* 3.9 3.8 3.2*  CL 107 100 101 97*  CO2 19* 20* 20* 25  GLUCOSE 85 95 99 88  BUN 16 17 16 14   CREATININE 0.98 1.04* 1.06* 1.27*  CALCIUM 6.5* 7.7* 8.0*  7.9*  MG  --  1.8  --  2.1   Liver Function Tests: Recent Labs  Lab 05/01/22 1635 05/01/22 1815  AST 259* 308*  ALT 205* 243*  ALKPHOS 106 111  BILITOT 1.2 1.4*  PROT 5.5* 6.4*  ALBUMIN 2.2* 2.6*   Recent Labs  Lab 05/01/22 1635  LIPASE 28   No results for input(s): "AMMONIA" in the last 168 hours. CBC: Recent Labs  Lab 05/01/22 1635 05/02/22 0425 05/03/22 0119  WBC 9.3 8.0 7.6  NEUTROABS  --   --  5.7  HGB 8.8* 9.1* 9.0*  HCT 27.9* 28.2* 26.7*  MCV 92.4 90.7 85.9  PLT 324 331 307   Cardiac Enzymes: No results for input(s): "CKTOTAL", "CKMB", "CKMBINDEX", "TROPONINI" in the last 168 hours. BNP: Invalid input(s): "POCBNP" CBG: No results for input(s): "GLUCAP" in the last 168 hours. D-Dimer No results for input(s): "DDIMER" in the last 72 hours. Hgb A1c No results for input(s): "HGBA1C" in the last 72 hours. Lipid Profile No results for input(s): "CHOL", "HDL", "LDLCALC", "TRIG", "CHOLHDL", "LDLDIRECT" in the last 72 hours. Thyroid function studies Recent Labs    05/01/22 1955  TSH 8.871*   Anemia work up No results for input(s): "VITAMINB12", "FOLATE", "FERRITIN", "TIBC", "IRON", "RETICCTPCT" in the last 72 hours. Urinalysis    Component Value Date/Time   COLORURINE YELLOW 05/01/2022 1922   APPEARANCEUR HAZY (A) 05/01/2022 1922   LABSPEC 1.029 05/01/2022 1922   PHURINE 5.0 05/01/2022 1922   GLUCOSEU 50 (A) 05/01/2022 1922   HGBUR SMALL (A) 05/01/2022 1922   BILIRUBINUR NEGATIVE 05/01/2022 1922   KETONESUR NEGATIVE 05/01/2022 1922   PROTEINUR 100 (A) 05/01/2022 1922   NITRITE NEGATIVE 05/01/2022 1922   LEUKOCYTESUR NEGATIVE 05/01/2022 1922   Sepsis Labs Recent Labs  Lab 05/01/22 1635 05/02/22 0425 05/03/22 0119  WBC 9.3 8.0 7.6   Microbiology No results found for this or any previous visit (from the past 240 hour(s)).   Time coordinating discharge:  32 minutes  SIGNED:   05/05/22, MD  [facility] 05/03/2022, 1:11 PM

## 2022-05-03 NOTE — TOC Transition Note (Signed)
Transition of Care [facility]) - CM/SW Discharge Note   Patient Details  Name: Laurie James MRN: [id number] Date of Birth: 25-Feb-1946  [facility] CM/SW Contact:  Tom-Johnson, Renea Ee, RN Phone Number: 05/03/2022, 1:39 PM   Clinical Narrative:     Patient is scheduled for discharge today. Outpatient f/u info on AVS. Declined Home health PT/OT as well as outpatient PT/OT. Denies any other needs. Family to transport at discharge. No Further TOC needs noted.       Final next level of care: Home/Self Care Barriers to Discharge: Barriers Resolved   Patient Goals and CMS Choice      Discharge Placement                  Patient to be transferred to facility by: Family      Discharge Plan and Services Additional resources added to the After Visit Summary for     Discharge Planning Services: CM Consult            DME Arranged: N/A DME Agency: NA       HH Arranged: Patient Refused [facility]          Social Determinants of Health (SDOH) Interventions SDOH Screenings   Food Insecurity: No Food Insecurity (05/02/2022)  Housing: Low Risk  (05/02/2022)  Transportation Needs: No Transportation Needs (05/02/2022)  Utilities: Not At Risk (05/02/2022)  Tobacco Use: Medium Risk (05/02/2022)     Readmission Risk Interventions     No data to display

## 2022-05-03 NOTE — Progress Notes (Signed)
Pt discharged to home. DC instructions given with grand daughter at bedside. No concerns voiced. Pt encouraged to stop by pharmacy and pick up meds that were e-prescribed by MD. Voiced understanding. Pt left unit in wheelchair pushed by hospital volunteer. Left in stable condition.

## 2022-05-03 NOTE — Plan of Care (Signed)

## 2022-05-03 NOTE — Discharge Instructions (Signed)

## 2022-05-04 LAB — PATHOLOGIST SMEAR REVIEW

## 2022-05-17 DIAGNOSIS — I509 Heart failure, unspecified: Secondary | ICD-10-CM | POA: Diagnosis not present

## 2022-05-18 ENCOUNTER — Encounter (HOSPITAL_COMMUNITY): Payer: Self-pay | Admitting: *Deleted

## 2022-05-24 DIAGNOSIS — I700 Atherosclerosis of aorta: Secondary | ICD-10-CM | POA: Diagnosis not present

## 2022-05-24 DIAGNOSIS — I270 Primary pulmonary hypertension: Secondary | ICD-10-CM | POA: Diagnosis not present

## 2022-05-24 DIAGNOSIS — I5043 Acute on chronic combined systolic (congestive) and diastolic (congestive) heart failure: Secondary | ICD-10-CM | POA: Diagnosis not present

## 2022-05-24 DIAGNOSIS — S81812A Laceration without foreign body, left lower leg, initial encounter: Secondary | ICD-10-CM | POA: Diagnosis not present

## 2022-05-24 DIAGNOSIS — J449 Chronic obstructive pulmonary disease, unspecified: Secondary | ICD-10-CM | POA: Diagnosis not present

## 2022-05-24 DIAGNOSIS — M109 Gout, unspecified: Secondary | ICD-10-CM | POA: Diagnosis not present

## 2022-05-24 DIAGNOSIS — L089 Local infection of the skin and subcutaneous tissue, unspecified: Secondary | ICD-10-CM | POA: Diagnosis not present

## 2022-05-24 DIAGNOSIS — E538 Deficiency of other specified B group vitamins: Secondary | ICD-10-CM | POA: Diagnosis not present

## 2022-05-24 DIAGNOSIS — D649 Anemia, unspecified: Secondary | ICD-10-CM | POA: Diagnosis not present

## 2022-05-29 NOTE — Progress Notes (Signed)
Advanced Heart Failure Clinic Note    PCP: Laurie Low, MD Primary Cardiologist: Laurie Rouge, MD  HF Cardiologist: Dr. Haroldine James  HPI: Laurie James is a 77 y.o. woman with severe COPD, blindness, malnutrition/frailty, HTN, systolic HF due to NICM (diagnosed 7/22) and atrial fibrillation.   Admitted 6/22 with new onset AF. EF normal and RV dilated. Cath with nonobstructive CAD. Sent home with [facility] and rate control w/ plans for outpatient DCCV after 3 weeks of a/c.   Readmitted 7/22 with acute HF -> cardiogenic shock. TEE EF 20% RV moderately down RA massively dilated. Heavy smoke in RA/LA and aortic root. DCCV canceled. Initial Co-ox 38% and required milrinone. Had right sided thoracentesis on 07/13 and subsequent CXRs showed some reaccumulation. Diuresed with IV lasix. Po lasix PRN at discharge.  D/c weight 99 lbs.  Echo 03/21/21: EF 55-60% RV dilated with normal function. Severe TR. Personally reviewed  Admitted 8/23 with afib RVR in the 150's. Given IV Cardizem 10 mg x 1 for rate control + IV Lasix. Eventually started amiodarone gtt and chemically converted out of RVR. Echo 8/23  showed EF: 40-45%, RV function mildly reduced RV mod enlarged. L/R atria severely dilated. Mild MR, Mod TR. Drips weaned, and oral amio started. Not ideal given COPD and EP outpatient follow up arranged. GDMT limited by mild AKI and Entresto held. Discharged home, weight 112 lbs.  She was seen back for f/u 9/22 and was doing well. Wt was stable. EKG showed NSR. AKI had resolved. Scr back to b/l, Scr down to 1.25. Delene James was restarted. Instructed to f/u in 2-3 wks for reassessment and further titration of meds.   In 10/23 had AKI and hyperkalemia. Scr increased from 1.25>>2.11. Entresto and spiro stopped. F/u check 03/30/22 SCr 1.29  AF ablation 03/08/22   Seen 03/31/22  in ED for mild hemoptysis. CT chest ok. Now resolved.   Follow up 12/23, NYHA II-III. She returns today for f/u. Here w/ daughter. Still  fatigued. But able to do ADLs. No CP. SOB with mild activity. Minimal edema. No CP. Remains in NSR. No bleeding  Admitted 1/24 with N/V. CT abd/pelvis showed moderate righ-sized pleural effusion, large stool burden. Given IVF and received laxatives. Underwent R thora yielding 700 ml of fluid. She did not want to stay for echo. She declined HH, and was discharged home. Stable from HF perspective.  Today she returns for HF follow up. Overall feeling fine. She does not have SOB with ADLs or walking on flat ground if she takes her time. Physically limited by leg weakness and poor eyesight. She has occasional dizziness. Denies palpitations, abnormal bleeding, CP, edema, or PND/Orthopnea. Appetite ok. No fever or chills. Weight at home 109 pounds. Taking all medications. Has not needed Lasix recently.  Echo today 05/31/22, EF appears on my read at least stable from previous, maybe a little higher. Official results pending.   Cardiac Studies:  - Echo (8/23): EF 40-45%, grade II DD, RV mildly reduced, moderate TR  - Echo (6/23): EF 45-50%, RV ok, severe TR  - Echo (10/06/20): EF 55-60 %   - LHC (10/07/20) showed mild 40% segmental RCA stenosis as well as 50 to 60% second diagonal branch stenosis  RHC 6/22 RA 3 PA 17/2 (8) PCWP 7 PVR  0.25 WU Fick 2.6/2.0 (LVEF was James)  - TEE (7/22): EF 20% RV moderately down RA massively dilated. Heavy smoke in RA/LA and aortic root.   Past Medical History:  Diagnosis Date   AMD (  age related macular degeneration)    COPD (chronic obstructive pulmonary disease) ([place])    Depression    Hiatal hernia    Intermittent James back pain    Memory impairment    Midsternal chest pain    a. 12/2011 Cardiac CTA Ca++ score of 103.3 (80th %), LAD <50p/m, RCA 50-75.   Osteoporosis    PAF (paroxysmal atrial fibrillation) ([place]) 09/2020   Vitamin B 12 deficiency    Current Outpatient Medications  Medication Sig Dispense Refill   albuterol (VENTOLIN HFA) 108 (90 Base)  MCG/ACT inhaler Inhale 2 puffs into the lungs every 4 (four) hours as needed for wheezing.     amiodarone (PACERONE) 200 MG tablet Take 1 tablet (200 mg total) by mouth 2 (two) times daily. 60 tablet 11   apixaban (ELIQUIS) 2.5 MG TABS tablet Take 1 tablet (2.5 mg total) by mouth 2 (two) times daily. 60 tablet 11   atorvastatin (LIPITOR) 40 MG tablet Take 1 tablet by mouth once daily (Patient taking differently: Take 40 mg by mouth daily.) 90 tablet 3   cyanocobalamin (,VITAMIN B-12,) 1000 MCG/ML injection Inject 1,000 mcg into the muscle every 30 (thirty) days.  3   furosemide (LASIX) 20 MG tablet Take 1 tablet (20 mg total) by mouth as needed. Leg edema 30 tablet 6   nitroGLYCERIN (NITROSTAT) 0.4 MG SL tablet Place 1 tablet (0.4 mg total) under the tongue every 5 (five) minutes x 3 doses as needed for chest pain. 25 tablet 0   ondansetron (ZOFRAN) 4 MG tablet Take 4 mg by mouth 3 (three) times daily as needed for nausea or vomiting.     pantoprazole (PROTONIX) 40 MG tablet Take 1 tablet (40 mg total) by mouth 2 (two) times daily. 180 tablet 3   polyethylene glycol (MIRALAX / GLYCOLAX) 17 g packet Take 17 g by mouth daily. 14 each 0   potassium chloride SA (KLOR-CON M) 20 MEQ tablet Take 2 tablets (40 mEq total) by mouth daily for 3 days. 6 tablet 0   No current facility-administered medications for this encounter.   Allergies  Allergen Reactions   Codeine Nausea And Vomiting and Other (See Comments)    Upsets the stomach   Social History   Socioeconomic History   Marital status: Married    Spouse name: Not on file   Number of children: Not on file   Years of education: Not on file   Highest education level: Not on file  Occupational History   Occupation: retired  Tobacco Use   Smoking status: Former    Packs/day: 1.00    Years: 65.00    Total pack years: 65.00    Types: Cigarettes    Quit date: 09/2020    Years since quitting: 1.7   Smokeless tobacco: Never   Tobacco comments:     Former smoker 04/04/22  Vaping Use   Vaping Use: Never used  Substance and Sexual Activity   Alcohol use: No    Alcohol/week: 0.0 standard drinks of alcohol    Comment: rarely   Drug use: No   Sexual activity: Not Currently  Other Topics Concern   Not on file  Social History Narrative   Not on file   Social Determinants of Health   Financial Resource Strain: Not on file  Food Insecurity: No Food Insecurity (05/02/2022)   Hunger Vital Sign    Worried About [place] in the Last Year: Never true    Ran Out of Food  in the Last Year: Never true  Transportation Needs: No Transportation Needs (05/02/2022)   PRAPARE - Hydrologist (Medical): No    Lack of Transportation (Non-Medical): No  Physical Activity: Not on file  Stress: Not on file  Social Connections: Not on file  Intimate Partner Violence: Not At Risk (05/02/2022)   Humiliation, Afraid, Rape, and Kick questionnaire    Fear of Current or Ex-Partner: No    Emotionally Abused: No    Physically Abused: No    Sexually Abused: No   Family History  Problem Relation Age of Onset   Breast cancer Sister    Breast cancer Sister    BP 110/70   Pulse 63   Wt 49.4 kg (109 lb)   SpO2 98%   BMI 24.44 kg/m   Wt Readings from Last 3 Encounters:  05/31/22 49.4 kg (109 lb)  05/02/22 52.1 kg (114 lb 13.8 oz)  04/04/22 49.9 kg (110 lb)   PHYSICAL EXAM: General:  NAD. No resp difficult, arrived in [facility], frail HEENT: Normal Neck: Supple. No JVD. Carotids 2+ bilat; no bruits. No lymphadenopathy or thryomegaly appreciated. Cor: PMI nondisplaced. Regular rate & rhythm. No rubs, gallops or murmurs. Lungs: Clear Abdomen: Soft, nontender, nondistended. No hepatosplenomegaly. No bruits or masses. Good bowel sounds. Extremities: No cyanosis, clubbing, rash, edema Neuro: Alert & oriented x 3, cranial nerves grossly intact. Moves all 4 extremities w/o difficulty. Affect pleasant.  ECG (personally  reviewed): NSR 63 bpm  ASSESSMENT & PLAN:  1. PAF  - PO amiodarone ultimately discontinued (h/o severe COPD) - Recurrent afib detected 5/23 but was well rate controlled - she does not tolerate Afib well (prior h/o tachy mediated CM and cardiogenic shock). Long term suppression w/ amiodarone not ideal w/ COPD.  - s/p AF RFA 11/23 - Remains in NSR on ECG today. - Continue Eliquis 5 mg bid.  Denies abnormal bleeding.  - She has EP follow up next week - CBC today.  2. Chronic Systolic Heart Failure - NICM - Echo (6/22): EF 20% RV moderately down RA massively dilated. Tachymediated from rapid Afib.  - LHC w/ nonobstructive CAD - Echo (12/22): EF normalized w/ restoration and maintenance of NSR, 55-60%, RV normal - Echo (5/23): EF back down, 45-50% (recurrent Afib w/ CVR) - Limited echo (8/23): EF 40-45%, RV mildly reduced, L/R atria severely dilated. Mild MR, Mod TR - NYHA II-III, limited by physical deconditioning. Volume OK today. - Continue Lasix 20 mg PRN. - Off Entresto, spiro and Jardiance with recurrent AKI and hyperkalemia; failed x 2.  Will not rechallenge unless EF drops back down. If re-challenge, consider Farxiga 5  - Repeat echo today, results pending. - Labs today.  3. H/o AKI on CKD Stage IIIB and Hyperkalemia - Had AKI in 10/23 Entresto and Jardiance stopped - Now improved  - Labs today.  4. Hypertension  - Controlled on current regimen - No change.   5. Tricuspid Regurgitation - Severe on prior Echo and LHC - Echo today.  Follow up in 3-4 months with Dr. Haroldine James   Rafael Bihari, FNP  11:59 AM

## 2022-05-31 ENCOUNTER — Ambulatory Visit (HOSPITAL_COMMUNITY)
Admission: RE | Admit: 2022-05-31 | Discharge: 2022-05-31 | Disposition: A | Discharge status: Home / Self Care | Payer: Medicare PPO | Source: Ambulatory Visit

## 2022-05-31 ENCOUNTER — Ambulatory Visit (HOSPITAL_COMMUNITY)
Admission: RE | Admit: 2022-05-31 | Discharge: 2022-05-31 | Disposition: A | Discharge status: Home / Self Care | Payer: Medicare PPO | Source: Ambulatory Visit | Attending: Family Medicine

## 2022-05-31 ENCOUNTER — Encounter (HOSPITAL_COMMUNITY): Payer: Self-pay

## 2022-05-31 VITALS — BP 110/70 | HR 63 | Wt 109.0 lb

## 2022-05-31 DIAGNOSIS — Z6824 Body mass index (BMI) 24.0-24.9, adult: Secondary | ICD-10-CM | POA: Diagnosis not present

## 2022-05-31 DIAGNOSIS — I10 Essential (primary) hypertension: Secondary | ICD-10-CM | POA: Diagnosis not present

## 2022-05-31 DIAGNOSIS — N179 Acute kidney failure, unspecified: Secondary | ICD-10-CM | POA: Diagnosis not present

## 2022-05-31 DIAGNOSIS — E875 Hyperkalemia: Secondary | ICD-10-CM | POA: Diagnosis not present

## 2022-05-31 DIAGNOSIS — I130 Hypertensive heart and chronic kidney disease with heart failure and stage 1 through stage 4 chronic kidney disease, or unspecified chronic kidney disease: Secondary | ICD-10-CM | POA: Diagnosis not present

## 2022-05-31 DIAGNOSIS — I5022 Chronic systolic (congestive) heart failure: Secondary | ICD-10-CM

## 2022-05-31 DIAGNOSIS — J449 Chronic obstructive pulmonary disease, unspecified: Secondary | ICD-10-CM | POA: Insufficient documentation

## 2022-05-31 DIAGNOSIS — E46 Unspecified protein-calorie malnutrition: Secondary | ICD-10-CM | POA: Insufficient documentation

## 2022-05-31 DIAGNOSIS — R42 Dizziness and giddiness: Secondary | ICD-10-CM | POA: Diagnosis not present

## 2022-05-31 DIAGNOSIS — N1832 Chronic kidney disease, stage 3b: Secondary | ICD-10-CM | POA: Insufficient documentation

## 2022-05-31 DIAGNOSIS — I480 Paroxysmal atrial fibrillation: Secondary | ICD-10-CM | POA: Diagnosis not present

## 2022-05-31 DIAGNOSIS — I071 Rheumatic tricuspid insufficiency: Secondary | ICD-10-CM

## 2022-05-31 DIAGNOSIS — Z87891 Personal history of nicotine dependence: Secondary | ICD-10-CM | POA: Insufficient documentation

## 2022-05-31 DIAGNOSIS — R54 Age-related physical debility: Secondary | ICD-10-CM | POA: Diagnosis not present

## 2022-05-31 DIAGNOSIS — I428 Other cardiomyopathies: Secondary | ICD-10-CM | POA: Insufficient documentation

## 2022-05-31 DIAGNOSIS — I2510 Atherosclerotic heart disease of native coronary artery without angina pectoris: Secondary | ICD-10-CM | POA: Insufficient documentation

## 2022-05-31 DIAGNOSIS — Z79899 Other long term (current) drug therapy: Secondary | ICD-10-CM | POA: Diagnosis not present

## 2022-05-31 DIAGNOSIS — Z7901 Long term (current) use of anticoagulants: Secondary | ICD-10-CM | POA: Insufficient documentation

## 2022-05-31 LAB — ECHOCARDIOGRAM COMPLETE
AV Vena cont: 0.3 cm
Area-P 1/2: 2.03 cm2
MV M vel: 3.01 m/s
MV Peak grad: 36.2 mmHg
P 1/2 time: 514 msec
S' Lateral: 3.4 cm

## 2022-05-31 LAB — CBC
HCT: 30 % — ABNORMAL LOW (ref 36.0–46.0)
Hemoglobin: 9.7 g/dL — ABNORMAL LOW (ref 12.0–15.0)
MCH: 28.7 pg (ref 26.0–34.0)
MCHC: 32.3 g/dL (ref 30.0–36.0)
MCV: 88.8 fL (ref 80.0–100.0)
Platelets: 277 10*3/uL (ref 150–400)
RBC: 3.38 MIL/uL — ABNORMAL LOW (ref 3.87–5.11)
RDW: 18.7 % — ABNORMAL HIGH (ref 11.5–15.5)
WBC: 7.5 10*3/uL (ref 4.0–10.5)
nRBC: 0 % (ref 0.0–0.2)

## 2022-05-31 LAB — BASIC METABOLIC PANEL
Anion gap: 8 (ref 5–15)
BUN: 15 mg/dL (ref 8–23)
CO2: 24 mmol/L (ref 22–32)
Calcium: 8.3 mg/dL — ABNORMAL LOW (ref 8.9–10.3)
Chloride: 104 mmol/L (ref 98–111)
Creatinine, Ser: 1.11 mg/dL — ABNORMAL HIGH (ref 0.44–1.00)
GFR, Estimated: 52 mL/min — ABNORMAL LOW (ref >60)
Glucose, Bld: 90 mg/dL (ref 70–99)
Potassium: 3.8 mmol/L (ref 3.5–5.1)
Sodium: 136 mmol/L (ref 135–145)

## 2022-05-31 NOTE — Patient Instructions (Signed)
Labs done today. We will contact you only if your labs are abnormal.  No medication changes were made. Please continue all current medications as prescribed.  Your physician recommends that you schedule a follow-up appointment in: 3 months with Dr. Haroldine Laws. Please contact our office in April to schedule a May appointment.   If you have any questions or concerns before your next appointment please send [place] a message through [place] or call our office at [phone].    TO LEAVE A MESSAGE FOR THE NURSE SELECT OPTION 2, PLEASE LEAVE A MESSAGE INCLUDING: YOUR NAME DATE OF BIRTH CALL BACK NUMBER REASON FOR CALL**this is important as we prioritize the call backs  YOU WILL RECEIVE A CALL BACK THE SAME DAY AS LONG AS YOU CALL BEFORE 4:00 PM   Do the following things EVERYDAY: Weigh yourself in the morning before breakfast. Write it down and keep it in a log. Take your medicines as prescribed Eat low salt foods--Limit salt (sodium) to 2000 mg per day.  Stay as active as you can everyday Limit all fluids for the day to less than 2 liters   At the [facility], you and your health needs are our priority. As part of our continuing mission to provide you with exceptional heart care, we have created designated Provider Care Teams. These Care Teams include your primary Cardiologist (physician) and Advanced Practice Providers (APPs- Physician Assistants and Nurse Practitioners) who all work together to provide you with the care you need, when you need it.   You may see any of the following providers on your designated Care Team at your next follow up: Dr Glori Bickers Dr Haynes Kerns, NP Lyda Jester, [place] Audry Riles, PharmD   Please be sure to bring in all your medications bottles to every appointment.

## 2022-06-05 ENCOUNTER — Telehealth (HOSPITAL_COMMUNITY): Payer: Self-pay

## 2022-06-05 ENCOUNTER — Other Ambulatory Visit (HOSPITAL_COMMUNITY): Payer: Self-pay

## 2022-06-05 DIAGNOSIS — I5022 Chronic systolic (congestive) heart failure: Secondary | ICD-10-CM

## 2022-06-05 NOTE — Telephone Encounter (Signed)
Patient and son aware of labs and medication changes

## 2022-06-08 ENCOUNTER — Ambulatory Visit: Payer: Medicare PPO | Attending: Cardiovascular Disease | Admitting: Cardiovascular Disease

## 2022-06-08 ENCOUNTER — Encounter: Payer: Self-pay | Admitting: Cardiovascular Disease

## 2022-06-08 VITALS — BP 112/68 | HR 66 | Ht <= 58 in | Wt 108.0 lb

## 2022-06-08 DIAGNOSIS — I4819 Other persistent atrial fibrillation: Secondary | ICD-10-CM | POA: Diagnosis not present

## 2022-06-08 NOTE — Patient Instructions (Signed)
Medication Instructions:  Your physician has recommended you make the following change in your medication: STOP Amiodarone  *If you need a refill on your cardiac medications before your next appointment, please call your pharmacy*   Lab Work: None ordered   Testing/Procedures: None ordered   Follow-Up: At [facility], you and your health needs are our priority.  As part of our continuing mission to provide you with exceptional heart care, we have created designated Provider Care Teams.  These Care Teams include your primary Cardiologist (physician) and Advanced Practice Providers (APPs -  Physician Assistants and Nurse Practitioners) who all work together to provide you with the care you need, when you need it.  We recommend signing up for the patient portal called "MyChart".  Sign up information is provided on this After Visit Summary.  MyChart is used to connect with patients for Virtual Visits (Telemedicine).  Patients are able to view lab/test results, encounter notes, upcoming appointments, etc.  Non-urgent messages can be sent to your provider as well.   To learn more about what you can do with MyChart, go to [email or web address].    Your next appointment:   3 month(s)  The format for your next appointment:   In Person  Provider:   Doralee Albino, MD    Thank you for choosing [facility]!!   [phone]

## 2022-06-08 NOTE — Progress Notes (Signed)
Cardiology Office Note:    Date:  06/08/2022   ID:  Laurie James, DOB 03-17-1946, MRN [id number]  PCP:  Laurie James, [place] Providers Cardiologist:  Laurie Rouge, MD Electrophysiologist:  Laurie Quitter, MD     Referring MD: Laurie Low, MD   History of Present Illness:    Laurie Schoenwetter Daraly James is a 77 y.o. female with a hx documented below -- including CHFrEF -- referred to electrophysiology clinic for management of atrial fibrillation.  Atrial fibrillation was first diagnosed in June 2022 and managed with amiodarone.  Amiodarone was discontinued due to severe COPD.  A-fib recurred in May 2023 but was well rate controlled at that time.  She had recurrence off of amiodarone, admitted with A-fib and RVR with rates up to 150s, CHF.  Amiodarone was restarted, but she was thought not to be a candidate to to potential pulmonary toxicity.  She underwent uncomplicated atrial fibrillation ablation in November 2024.  She reports that since the ablation she has felt very fatigued and weak.  She thinks that her symptoms are worse now than they were prior to the ablation.  Admitted for heart failure in January but remained in sinus rhythm throughout the admission.  Past Medical History:  Diagnosis Date   AMD (age related macular degeneration)    COPD (chronic obstructive pulmonary disease) ([place])    Depression    Hiatal hernia    Intermittent James back pain    Memory impairment    Midsternal chest pain    a. 12/2011 Cardiac CTA Ca++ score of 103.3 (80th %), LAD <50p/m, RCA 50-75.   Osteoporosis    PAF (paroxysmal atrial fibrillation) ([place]) 09/2020   Vitamin B 12 deficiency     Past Surgical History:  Procedure Laterality Date   ABDOMINAL HYSTERECTOMY     ATRIAL FIBRILLATION ABLATION N/A 03/08/2022   Procedure: ATRIAL FIBRILLATION ABLATION;  Surgeon: Laurie Quitter, MD;  Location: [place] [facility];  Service: Cardiovascular;  Laterality:  N/A;   IR THORACENTESIS ASP PLEURAL SPACE W/IMG GUIDE  10/20/2020   IR THORACENTESIS ASP PLEURAL SPACE W/IMG GUIDE  05/02/2022   RIGHT/LEFT HEART CATH AND CORONARY ANGIOGRAPHY N/A 10/07/2020   Procedure: RIGHT/LEFT HEART CATH AND CORONARY ANGIOGRAPHY;  Surgeon: Laurie Harp, MD;  Location: [place] [facility];  Service: Cardiovascular;  Laterality: N/A;   TEE WITHOUT CARDIOVERSION N/A 10/25/2020   Procedure: TRANSESOPHAGEAL ECHOCARDIOGRAM (TEE);  Surgeon: Laurie Margarita, MD;  Location: [facility] ENDOSCOPY;  Service: Cardiovascular;  Laterality: N/A;    Current Medications: Current Meds  Medication Sig   albuterol (VENTOLIN HFA) 108 (90 Base) MCG/ACT inhaler Inhale 2 puffs into the lungs every 4 (four) hours as needed for wheezing.   amiodarone (PACERONE) 200 MG tablet Take 1 tablet (200 mg total) by mouth 2 (two) times daily.   apixaban (ELIQUIS) 2.5 MG TABS tablet Take 1 tablet (2.5 mg total) by mouth 2 (two) times daily.   atorvastatin (LIPITOR) 40 MG tablet Take 1 tablet by mouth once daily (Patient taking differently: Take 40 mg by mouth daily.)   cyanocobalamin (,VITAMIN B-12,) 1000 MCG/ML injection Inject 1,000 mcg into the muscle every 30 (thirty) days.   furosemide (LASIX) 20 MG tablet Take 1 tablet (20 mg total) by mouth as needed. Leg edema   nitroGLYCERIN (NITROSTAT) 0.4 MG SL tablet Place 1 tablet (0.4 mg total) under the tongue every 5 (five) minutes x 3 doses as needed for chest pain.  ondansetron (ZOFRAN) 4 MG tablet Take 4 mg by mouth 3 (three) times daily as needed for nausea or vomiting.   pantoprazole (PROTONIX) 40 MG tablet Take 1 tablet (40 mg total) by mouth 2 (two) times daily.   polyethylene glycol (MIRALAX / GLYCOLAX) 17 g packet Take 17 g by mouth daily.     Allergies:   Codeine   Social History   Socioeconomic History   Marital status: Married    Spouse name: Not on file   Number of children: Not on file   Years of education: Not on file   Highest education  level: Not on file  Occupational History   Occupation: retired  Tobacco Use   Smoking status: Former    Packs/day: 1.00    Years: 65.00    Total pack years: 65.00    Types: Cigarettes    Quit date: 09/2020    Years since quitting: 1.7   Smokeless tobacco: Never   Tobacco comments:    Former smoker 04/04/22  Vaping Use   Vaping Use: Never used  Substance and Sexual Activity   Alcohol use: No    Alcohol/week: 0.0 standard drinks of alcohol    Comment: rarely   Drug use: No   Sexual activity: Not Currently  Other Topics Concern   Not on file  Social History Narrative   Not on file   Social Determinants of Health   Financial Resource Strain: Not on file  Food Insecurity: No Food Insecurity (05/02/2022)   Hunger Vital Sign    Worried About Running Out of Food in the Last Year: Never true    [place] in the Last Year: Never true  Transportation Needs: No Transportation Needs (05/02/2022)   PRAPARE - Hydrologist (Medical): No    Lack of Transportation (Non-Medical): No  Physical Activity: Not on file  Stress: Not on file  Social Connections: Not on file     Family History: The patient's family history includes Breast cancer in her sister and sister.  ROS:   Please see the history of present illness.    All other systems reviewed and are negative.  EKGs/Labs/Other Studies Reviewed:     - Echo (8/23): EF 40-45%, grade II DD, RV mildly reduced, moderate TR   - Echo (6/23): EF 45-50%, RV ok, severe TR   - Echo (10/06/20): EF 55-60 %    - LHC (10/07/2020) showed mild 40% segmental RCA stenosis as well as 50 to 60% second diagonal branch stenosis   RHC 6/22 RA 3 PA 17/2 (8) PCWP 7 PVR  0.25 WU Fick 2.6/2.0 (LVEF was James)   - TEE (7/22): EF 20% RV moderately down RA massively dilated. Heavy smoke in RA/LA and aortic root.   EKG:  Last EKG results: today - suspect sinus rhythm with James atrial amplitude. Artifact precludes  interpretation.   Recent Labs: 05/01/2022: ALT 243; B Natriuretic Peptide 700.0; TSH 8.871 05/03/2022: Magnesium 2.1 05/31/2022: BUN 15; Creatinine, Ser 1.11; Hemoglobin 9.7; Platelets 277; Potassium 3.8; Sodium 136           Physical Exam:    VS:  BP 112/68   Pulse 66   Ht '4\' 8"'$  (1.422 m)   Wt 108 lb (49 kg)   SpO2 97%   BMI 24.21 kg/m     Wt Readings from Last 3 Encounters:  06/08/22 108 lb (49 kg)  05/31/22 109 lb (49.4 kg)  05/02/22 114 lb 13.8 oz (  52.1 kg)     GEN:  Well nourished, well developed in no acute distress CARDIAC: RRR, no murmurs, rubs, gallops RESPIRATORY:  Normal work of breathing MUSCULOSKELETAL: no edema    ASSESSMENT & PLAN:    Atrial fibrillation: paroxysmal, symptomatic with CHF, uncontrolled rates.  Maintaining sinus rhythm after ablation in Nov 2024. Stop amiodarone since she has recovered from A-fib ablation at this point.  Chronic CHFrEF: EF initially 20%, normalized with rhythm control, dropped again with recurrent AF and RVR. Suspect tachy strongly contributing. Appears euvolemic today. GDMT per CHF team has been complicated by AKI and hyperkalemia.   AKI with CKD, hyperkalemia:          Medication Adjustments/Labs and Tests Ordered: Current medicines are reviewed at length with the patient today.  Concerns regarding medicines are outlined above.  No orders of the defined types were placed in this encounter.  No orders of the defined types were placed in this encounter.    Signed, Laurie Quitter, MD  06/08/2022 12:04 PM    [place]

## 2022-06-12 NOTE — Addendum Note (Signed)
Addended by: Michelle Nasuti on: 06/12/2022 06:57 AM   Modules accepted: Orders

## 2022-07-30 ENCOUNTER — Other Ambulatory Visit: Payer: Self-pay | Admitting: Cardiovascular Disease

## 2022-08-06 DIAGNOSIS — R051 Acute cough: Secondary | ICD-10-CM | POA: Diagnosis not present

## 2022-08-06 DIAGNOSIS — R0981 Nasal congestion: Secondary | ICD-10-CM | POA: Diagnosis not present

## 2022-08-06 DIAGNOSIS — J019 Acute sinusitis, unspecified: Secondary | ICD-10-CM | POA: Diagnosis not present

## 2022-09-04 ENCOUNTER — Other Ambulatory Visit: Payer: Self-pay | Admitting: Cardiovascular Disease

## 2022-09-04 ENCOUNTER — Other Ambulatory Visit (HOSPITAL_COMMUNITY): Payer: Self-pay | Admitting: Internal Medicine

## 2022-09-05 ENCOUNTER — Other Ambulatory Visit: Payer: Self-pay | Admitting: *Deleted

## 2022-09-05 ENCOUNTER — Other Ambulatory Visit: Payer: Self-pay

## 2022-09-05 ENCOUNTER — Emergency Department (HOSPITAL_COMMUNITY): Payer: Medicare PPO

## 2022-09-05 ENCOUNTER — Other Ambulatory Visit (HOSPITAL_COMMUNITY): Payer: Self-pay

## 2022-09-05 ENCOUNTER — Encounter (HOSPITAL_COMMUNITY): Payer: Self-pay

## 2022-09-05 ENCOUNTER — Inpatient Hospital Stay (HOSPITAL_COMMUNITY)
Admission: EM | Admit: 2022-09-05 | Discharge: 2022-09-08 | DRG: 286 | Disposition: A | Discharge status: Home Health | Payer: Medicare PPO | Attending: Internal Medicine | Admitting: Internal Medicine

## 2022-09-05 DIAGNOSIS — I130 Hypertensive heart and chronic kidney disease with heart failure and stage 1 through stage 4 chronic kidney disease, or unspecified chronic kidney disease: Secondary | ICD-10-CM | POA: Diagnosis present

## 2022-09-05 DIAGNOSIS — J90 Pleural effusion, not elsewhere classified: Secondary | ICD-10-CM | POA: Diagnosis not present

## 2022-09-05 DIAGNOSIS — I071 Rheumatic tricuspid insufficiency: Secondary | ICD-10-CM | POA: Diagnosis present

## 2022-09-05 DIAGNOSIS — Z87891 Personal history of nicotine dependence: Secondary | ICD-10-CM

## 2022-09-05 DIAGNOSIS — M109 Gout, unspecified: Secondary | ICD-10-CM | POA: Diagnosis present

## 2022-09-05 DIAGNOSIS — I272 Pulmonary hypertension, unspecified: Secondary | ICD-10-CM | POA: Diagnosis present

## 2022-09-05 DIAGNOSIS — E875 Hyperkalemia: Secondary | ICD-10-CM | POA: Diagnosis present

## 2022-09-05 DIAGNOSIS — I5043 Acute on chronic combined systolic (congestive) and diastolic (congestive) heart failure: Secondary | ICD-10-CM | POA: Diagnosis present

## 2022-09-05 DIAGNOSIS — I2781 Cor pulmonale (chronic): Secondary | ICD-10-CM | POA: Diagnosis present

## 2022-09-05 DIAGNOSIS — I480 Paroxysmal atrial fibrillation: Secondary | ICD-10-CM | POA: Diagnosis present

## 2022-09-05 DIAGNOSIS — I5023 Acute on chronic systolic (congestive) heart failure: Secondary | ICD-10-CM | POA: Diagnosis not present

## 2022-09-05 DIAGNOSIS — G4733 Obstructive sleep apnea (adult) (pediatric): Secondary | ICD-10-CM | POA: Diagnosis present

## 2022-09-05 DIAGNOSIS — I2583 Coronary atherosclerosis due to lipid rich plaque: Secondary | ICD-10-CM

## 2022-09-05 DIAGNOSIS — J9601 Acute respiratory failure with hypoxia: Secondary | ICD-10-CM | POA: Diagnosis present

## 2022-09-05 DIAGNOSIS — H547 Unspecified visual loss: Secondary | ICD-10-CM | POA: Diagnosis present

## 2022-09-05 DIAGNOSIS — I428 Other cardiomyopathies: Secondary | ICD-10-CM | POA: Diagnosis present

## 2022-09-05 DIAGNOSIS — I509 Heart failure, unspecified: Secondary | ICD-10-CM | POA: Diagnosis not present

## 2022-09-05 DIAGNOSIS — L719 Rosacea, unspecified: Secondary | ICD-10-CM | POA: Diagnosis present

## 2022-09-05 DIAGNOSIS — E871 Hypo-osmolality and hyponatremia: Secondary | ICD-10-CM | POA: Diagnosis present

## 2022-09-05 DIAGNOSIS — N1831 Chronic kidney disease, stage 3a: Secondary | ICD-10-CM

## 2022-09-05 DIAGNOSIS — E785 Hyperlipidemia, unspecified: Secondary | ICD-10-CM | POA: Diagnosis present

## 2022-09-05 DIAGNOSIS — I371 Nonrheumatic pulmonary valve insufficiency: Secondary | ICD-10-CM | POA: Diagnosis present

## 2022-09-05 DIAGNOSIS — M810 Age-related osteoporosis without current pathological fracture: Secondary | ICD-10-CM | POA: Diagnosis present

## 2022-09-05 DIAGNOSIS — R6889 Other general symptoms and signs: Secondary | ICD-10-CM | POA: Diagnosis not present

## 2022-09-05 DIAGNOSIS — I10 Essential (primary) hypertension: Secondary | ICD-10-CM | POA: Diagnosis not present

## 2022-09-05 DIAGNOSIS — J410 Simple chronic bronchitis: Secondary | ICD-10-CM | POA: Diagnosis not present

## 2022-09-05 DIAGNOSIS — Z90710 Acquired absence of both cervix and uterus: Secondary | ICD-10-CM

## 2022-09-05 DIAGNOSIS — I5032 Chronic diastolic (congestive) heart failure: Secondary | ICD-10-CM | POA: Diagnosis present

## 2022-09-05 DIAGNOSIS — E876 Hypokalemia: Secondary | ICD-10-CM | POA: Diagnosis present

## 2022-09-05 DIAGNOSIS — Z7901 Long term (current) use of anticoagulants: Secondary | ICD-10-CM

## 2022-09-05 DIAGNOSIS — F32A Depression, unspecified: Secondary | ICD-10-CM | POA: Diagnosis present

## 2022-09-05 DIAGNOSIS — J449 Chronic obstructive pulmonary disease, unspecified: Secondary | ICD-10-CM | POA: Diagnosis present

## 2022-09-05 DIAGNOSIS — I110 Hypertensive heart disease with heart failure: Secondary | ICD-10-CM | POA: Diagnosis not present

## 2022-09-05 DIAGNOSIS — H3530 Unspecified macular degeneration: Secondary | ICD-10-CM | POA: Diagnosis present

## 2022-09-05 DIAGNOSIS — J441 Chronic obstructive pulmonary disease with (acute) exacerbation: Secondary | ICD-10-CM | POA: Diagnosis present

## 2022-09-05 DIAGNOSIS — I5033 Acute on chronic diastolic (congestive) heart failure: Secondary | ICD-10-CM | POA: Diagnosis present

## 2022-09-05 DIAGNOSIS — I2510 Atherosclerotic heart disease of native coronary artery without angina pectoris: Secondary | ICD-10-CM | POA: Diagnosis present

## 2022-09-05 DIAGNOSIS — R0602 Shortness of breath: Secondary | ICD-10-CM | POA: Diagnosis not present

## 2022-09-05 DIAGNOSIS — Z885 Allergy status to narcotic agent status: Secondary | ICD-10-CM | POA: Diagnosis not present

## 2022-09-05 DIAGNOSIS — G3184 Mild cognitive impairment, so stated: Secondary | ICD-10-CM | POA: Diagnosis present

## 2022-09-05 DIAGNOSIS — N1832 Chronic kidney disease, stage 3b: Secondary | ICD-10-CM | POA: Diagnosis present

## 2022-09-05 DIAGNOSIS — Z79899 Other long term (current) drug therapy: Secondary | ICD-10-CM

## 2022-09-05 DIAGNOSIS — K219 Gastro-esophageal reflux disease without esophagitis: Secondary | ICD-10-CM | POA: Diagnosis not present

## 2022-09-05 DIAGNOSIS — R001 Bradycardia, unspecified: Secondary | ICD-10-CM | POA: Diagnosis present

## 2022-09-05 DIAGNOSIS — Z803 Family history of malignant neoplasm of breast: Secondary | ICD-10-CM

## 2022-09-05 LAB — CBC
HCT: 32.1 % — ABNORMAL LOW (ref 36.0–46.0)
Hemoglobin: 9.7 g/dL — ABNORMAL LOW (ref 12.0–15.0)
MCH: 27.2 pg (ref 26.0–34.0)
MCHC: 30.2 g/dL (ref 30.0–36.0)
MCV: 90.2 fL (ref 80.0–100.0)
Platelets: 281 K/uL (ref 150–400)
RBC: 3.56 MIL/uL — ABNORMAL LOW (ref 3.87–5.11)
RDW: 18.3 % — ABNORMAL HIGH (ref 11.5–15.5)
WBC: 7.4 K/uL (ref 4.0–10.5)
nRBC: 0 % (ref 0.0–0.2)

## 2022-09-05 LAB — BRAIN NATRIURETIC PEPTIDE: B Natriuretic Peptide: 1069.2 pg/mL — ABNORMAL HIGH (ref 0.0–100.0)

## 2022-09-05 LAB — BASIC METABOLIC PANEL WITH GFR
Anion gap: 10 (ref 5–15)
BUN: 17 mg/dL (ref 8–23)
CO2: 22 mmol/L (ref 22–32)
Calcium: 8.6 mg/dL — ABNORMAL LOW (ref 8.9–10.3)
Chloride: 104 mmol/L (ref 98–111)
Creatinine, Ser: 1 mg/dL (ref 0.44–1.00)
GFR, Estimated: 58 mL/min — ABNORMAL LOW
Glucose, Bld: 140 mg/dL — ABNORMAL HIGH (ref 70–99)
Potassium: 3.4 mmol/L — ABNORMAL LOW (ref 3.5–5.1)
Sodium: 136 mmol/L (ref 135–145)

## 2022-09-05 LAB — MAGNESIUM: Magnesium: 2 mg/dL (ref 1.7–2.4)

## 2022-09-05 MED ORDER — FUROSEMIDE 10 MG/ML IJ SOLN
40.0000 mg | Freq: Two times a day (BID) | INTRAMUSCULAR | Status: DC
  Administered 2022-09-06: 40 mg via INTRAVENOUS
  Filled 2022-09-05: qty 4

## 2022-09-05 MED ORDER — ACETAMINOPHEN 650 MG RE SUPP: 650.0000 mg | Freq: Four times a day (QID) | RECTAL | Status: DC | PRN

## 2022-09-05 MED ORDER — SODIUM CHLORIDE 0.9% FLUSH
3.0000 mL | Freq: Two times a day (BID) | INTRAVENOUS | Status: DC
  Administered 2022-09-05 – 2022-09-08 (×5): 3 mL via INTRAVENOUS

## 2022-09-05 MED ORDER — ACETAMINOPHEN 325 MG PO TABS
650.0000 mg | ORAL_TABLET | Freq: Four times a day (QID) | ORAL | Status: DC | PRN
  Administered 2022-09-05 – 2022-09-07 (×3): 650 mg via ORAL
  Filled 2022-09-05 (×4): qty 2

## 2022-09-05 MED ORDER — APIXABAN 2.5 MG PO TABS: 5.0000 mg | ORAL_TABLET | Freq: Two times a day (BID) | ORAL | Status: DC

## 2022-09-05 MED ORDER — POLYETHYLENE GLYCOL 3350 17 G PO PACK
17.0000 g | PACK | Freq: Every day | ORAL | Status: DC | PRN
  Filled 2022-09-05: qty 1

## 2022-09-05 MED ORDER — CARVEDILOL 3.125 MG PO TABS
3.1250 mg | ORAL_TABLET | Freq: Two times a day (BID) | ORAL | Status: DC
  Administered 2022-09-05 – 2022-09-06 (×2): 3.125 mg via ORAL
  Filled 2022-09-05 (×4): qty 1

## 2022-09-05 MED ORDER — PANTOPRAZOLE SODIUM 40 MG PO TBEC
40.0000 mg | DELAYED_RELEASE_TABLET | Freq: Two times a day (BID) | ORAL | Status: DC
  Administered 2022-09-06 – 2022-09-08 (×5): 40 mg via ORAL
  Filled 2022-09-05 (×5): qty 1

## 2022-09-05 MED ORDER — ATORVASTATIN CALCIUM 40 MG PO TABS: 40.0000 mg | ORAL_TABLET | Freq: Every day | ORAL | 0 refills | Status: DC

## 2022-09-05 MED ORDER — PANTOPRAZOLE SODIUM 40 MG PO TBEC: 40.0000 mg | DELAYED_RELEASE_TABLET | Freq: Two times a day (BID) | ORAL | 3 refills | Status: DC

## 2022-09-05 MED ORDER — ATORVASTATIN CALCIUM 40 MG PO TABS
40.0000 mg | ORAL_TABLET | Freq: Every day | ORAL | Status: DC
  Administered 2022-09-06 – 2022-09-08 (×3): 40 mg via ORAL
  Filled 2022-09-05 (×3): qty 1

## 2022-09-05 MED ORDER — POTASSIUM CHLORIDE CRYS ER 20 MEQ PO TBCR
40.0000 meq | EXTENDED_RELEASE_TABLET | Freq: Once | ORAL | Status: AC
  Administered 2022-09-05: 40 meq via ORAL
  Filled 2022-09-05: qty 2

## 2022-09-05 MED ORDER — ALBUTEROL SULFATE (2.5 MG/3ML) 0.083% IN NEBU: 3.0000 mL | INHALATION_SOLUTION | RESPIRATORY_TRACT | Status: DC | PRN

## 2022-09-05 MED ORDER — FUROSEMIDE 10 MG/ML IJ SOLN
40.0000 mg | Freq: Once | INTRAMUSCULAR | Status: AC
  Administered 2022-09-05: 40 mg via INTRAVENOUS
  Filled 2022-09-05: qty 4

## 2022-09-05 NOTE — Consult Note (Signed)
Cardiology Consultation   Patient ID: Laurie James MRN: [id number]; DOB: [birth year]  Admit date: 09/05/2022 Date of Consult: 09/05/2022  PCP:  Georgann Housekeeper, MD   [facility] Providers Cardiologist:  Charlton Haws, MD  Electrophysiologist:  Maurice Small, MD       Patient Profile:   Laurie James Laurie James is a 77 y.o. female with a hx of severe COPD, blindness, malnutrition/frailty, HTN, NICM, combined heart failure, and A-fib  who is being seen 09/05/2022 for the evaluation of heart failure at the request of Dr. Rosalia Hammers.  History of Present Illness:   Ms. Reicher has an extensive cardiac history and has been followed by Dr. Eden Emms and the heart failure clinic.    She was admitted 6/22 with new onset AF. EF normal and RV dilated. Cath with nonobstructive CAD. Sent home with [facility] and rate control w/ plans for outpatient DCCV after 3 weeks of a/c.   Readmitted 7/22 with acute HF -> cardiogenic shock. TEE EF 20% RV moderately down RA massively dilated. She required milrinone. Had right sided thoracentesis on 07/13 and subsequent CXRs showed some reaccumulation. Diuresed with IV lasix. Po lasix PRN at discharge.  D/c weight 99 lbs.   Echo 03/21/21: EF 55-60% RV dilated with normal function. Severe TR.    Admitted 8/23 with afib RVR in the 150's. Echo 8/23  showed EF: 40-45%, RV function mildly reduced RV mod enlarged. L/R atria severely dilated. Mild MR, Mod TR. Converted with amio. GDMT limited by mild AKI and Entresto held. Discharged home, weight 112 lbs.    Successful AF ablation 03/08/22      Admitted 1/24 with N/V. CT abd/pelvis showed moderate right-sized pleural effusion. Underwent R thora yielding 700 ml of fluid.    Echo 05/31/22 showed EF 50-55% with no regional wall abnormalities. Severely dilated Left atrium and right atrium. Mild MR, Severe TR, Mild AR  Today 09/05/22 the patient presents to the ED with complaints of worsening shortness of  breath and generalized weakness over the last couple of months. She denies chest pain. She states that she last took her PRN lasix at home 2 weeks ago. ED labs: K 3.4, BNP 1069.2, Hgb 9.7. Chest x-ray showed new moderate right pleural effusion, small left pleural effusion.   Past Medical History:  Diagnosis Date   AMD (age related macular degeneration)    COPD (chronic obstructive pulmonary disease) (HCC)    Depression    Hiatal hernia    Intermittent low back pain    Memory impairment    Midsternal chest pain    a. 12/2011 Cardiac CTA Ca++ score of 103.3 (80th %), LAD <50p/m, RCA 50-75.   Osteoporosis    PAF (paroxysmal atrial fibrillation) (HCC) 09/2020   Vitamin B 12 deficiency     Past Surgical History:  Procedure Laterality Date   ABDOMINAL HYSTERECTOMY     ATRIAL FIBRILLATION ABLATION N/A 03/08/2022   Procedure: ATRIAL FIBRILLATION ABLATION;  Surgeon: Maurice Small, MD;  Location: [facility] INVASIVE CV LAB;  Service: Cardiovascular;  Laterality: N/A;   IR THORACENTESIS ASP PLEURAL SPACE W/IMG GUIDE  10/20/2020   IR THORACENTESIS ASP PLEURAL SPACE W/IMG GUIDE  05/02/2022   RIGHT/LEFT HEART CATH AND CORONARY ANGIOGRAPHY N/A 10/07/2020   Procedure: RIGHT/LEFT HEART CATH AND CORONARY ANGIOGRAPHY;  Surgeon: Runell Gess, MD;  Location: [facility] INVASIVE CV LAB;  Service: Cardiovascular;  Laterality: N/A;   TEE WITHOUT CARDIOVERSION N/A 10/25/2020   Procedure: TRANSESOPHAGEAL ECHOCARDIOGRAM (TEE);  Surgeon:  Quintella Reichert, MD;  Location: [facility] ENDOSCOPY;  Service: Cardiovascular;  Laterality: N/A;     Home Medications:  Prior to Admission medications   Medication Sig Start Date End Date Taking? Authorizing Provider  albuterol (VENTOLIN HFA) 108 (90 Base) MCG/ACT inhaler Inhale 2 puffs into the lungs every 4 (four) hours as needed for wheezing. 06/08/20   [provider]  apixaban (ELIQUIS) 2.5 MG TABS tablet Take 1 tablet (2.5 mg total) by mouth 2 (two) times daily. 01/17/22    Bensimhon, Bevelyn Buckles, MD  atorvastatin (LIPITOR) 40 MG tablet Take 1 tablet (40 mg total) by mouth daily. 09/05/22   Wendall Stade, MD  cyanocobalamin (,VITAMIN B-12,) 1000 MCG/ML injection Inject 1,000 mcg into the muscle every 30 (thirty) days. 06/11/16   [provider]  furosemide (LASIX) 20 MG tablet Take 1 tablet (20 mg total) by mouth as needed. Leg edema 12/30/21   Jacklynn Ganong, FNP  nitroGLYCERIN (NITROSTAT) 0.4 MG SL tablet Place 1 tablet (0.4 mg total) under the tongue every 5 (five) minutes x 3 doses as needed for chest pain. 10/11/20   Filbert Schilder, NP  ondansetron (ZOFRAN) 4 MG tablet Take 4 mg by mouth 3 (three) times daily as needed for nausea or vomiting. 11/22/20   [provider]  pantoprazole (PROTONIX) 40 MG tablet Take 1 tablet (40 mg total) by mouth 2 (two) times daily. 09/05/22   Bensimhon, Bevelyn Buckles, MD  polyethylene glycol (MIRALAX / GLYCOLAX) 17 g packet Take 17 g by mouth daily. 05/04/22   Dorcas Carrow, MD  potassium chloride SA (KLOR-CON M) 20 MEQ tablet Take 2 tablets (40 mEq total) by mouth daily for 3 days. 05/03/22 05/06/22  Dorcas Carrow, MD    Inpatient Medications: Scheduled Meds:  furosemide  40 mg Intravenous Once   Continuous Infusions:  PRN Meds:   Allergies:    Allergies  Allergen Reactions   Codeine Nausea And Vomiting and Other (See Comments)    Upsets the stomach    Social History:   Social History   Socioeconomic History   Marital status: Married    Spouse name: Not on file   Number of children: Not on file   Years of education: Not on file   Highest education level: Not on file  Occupational History   Occupation: retired  Tobacco Use   Smoking status: Former    Packs/day: 1.00    Years: 65.00    Additional pack years: 0.00    Total pack years: 65.00    Types: Cigarettes    Quit date: 09/2020    Years since quitting: 1.9   Smokeless tobacco: Never   Tobacco comments:    Former smoker 04/04/22  Vaping  Use   Vaping Use: Never used  Substance and Sexual Activity   Alcohol use: No    Alcohol/week: 0.0 standard drinks of alcohol    Comment: rarely   Drug use: No   Sexual activity: Not Currently  Other Topics Concern   Not on file  Social History Narrative   Not on file   Social Determinants of Health   Financial Resource Strain: Not on file  Food Insecurity: No Food Insecurity (05/02/2022)   Hunger Vital Sign    Worried About Running Out of Food in the Last Year: Never true    Ran Out of Food in the Last Year: Never true  Transportation Needs: No Transportation Needs (05/02/2022)   PRAPARE - Transportation    Lack of  Transportation (Medical): No    Lack of Transportation (Non-Medical): No  Physical Activity: Not on file  Stress: Not on file  Social Connections: Not on file  Intimate Partner Violence: Not At Risk (05/02/2022)   Humiliation, Afraid, Rape, and Kick questionnaire    Fear of Current or Ex-Partner: No    Emotionally Abused: No    Physically Abused: No    Sexually Abused: No    Family History:    Family History  Problem Relation Age of Onset   Breast cancer Sister    Breast cancer Sister      ROS:  Please see the history of present illness.  All other ROS reviewed and negative.     Physical Exam/Data:   Vitals:   09/05/22 1239 09/05/22 1240 09/05/22 1242  BP:  (!) 165/91   Pulse:  65   Resp:  17   Temp:  98.3 F (36.8 C)   TempSrc:  Oral   SpO2: 95% 99%   Weight:   48.1 kg  Height:   4\' 8"  (1.422 m)   No intake or output data in the 24 hours ending 09/05/22 1444    09/05/2022   12:42 PM 06/08/2022   11:30 AM 05/31/2022   11:41 AM  Last 3 Weights  Weight (lbs) 106 lb 108 lb 109 lb  Weight (kg) 48.081 kg 48.988 kg 49.442 kg     Body mass index is 23.76 kg/m.  General:  Thin, well developed, in no acute distress Neck: JVD present Cardiac:  normal S1, S2; RRR; loud systolic murmur  Lungs:  clear to auscultation bilaterally, absent right lung  base, no wheezing, rhonchi or rales  Abd: soft, nontender Ext: no edema Skin: warm and dry  Neuro:  CNs 2-12 intact, no focal abnormalities noted Psych:  Normal affect   EKG:  The EKG was personally reviewed and demonstrates:  Sinus rhythm HR 73, with non specific changes   Telemetry:  Telemetry was personally reviewed and demonstrates:  NSR HR 60-70  Relevant CV Studies: Chest x-ray 09/05/22 IMPRESSION: 1. New moderate right pleural effusion. 2. Small left pleural effusion, slightly increased in size when compared with the prior exam.    Laboratory Data:  Chemistry Recent Labs  Lab 09/05/22 1251  NA 136  K 3.4*  CL 104  CO2 22  GLUCOSE 140*  BUN 17  CREATININE 1.00  CALCIUM 8.6*  GFRNONAA 58*  ANIONGAP 10      Hematology Recent Labs  Lab 09/05/22 1251  WBC 7.4  RBC 3.56*  HGB 9.7*  HCT 32.1*  MCV 90.2  MCH 27.2  MCHC 30.2  RDW 18.3*  PLT 281    BNP Recent Labs  Lab 09/05/22 1251  BNP 1,069.2*    Radiology/Studies:  DG Chest 2 View  Result Date: 09/05/2022 CLINICAL DATA:  Shortness of breath EXAM: CHEST - 2 VIEW COMPARISON:  Chest x-ray dated May 02, 2018 FINDINGS: Cardiac and mediastinal contours are unchanged. Unchanged biapical pleural-parenchymal scarring. New moderate right pleural effusion. Small left pleural effusion, slightly increased in size when compared with the prior exam. No evidence of pneumothorax. Levocurvature of the thoracic spine. IMPRESSION: 1. New moderate right pleural effusion. 2. Small left pleural effusion, slightly increased in size when compared with the prior exam. Electronically Signed   By: Allegra Lai M.D.   On: 09/05/2022 13:25     Assessment and Plan:   Acute on chronic HFrEF Acute hypoxic resp failure NICM -- presented with increasing shortness  of breath and weakness -- fluid overloaded on exam, BNP 1069.2 -- EF 50-55% on echo 05/31/22 -- repeat echo -- has been taken off jardiance, spiro, and entresto  in the past due to AKI and hyperkalemia -- will add low dose coreg 3.125mg  BID, will be limited by heart rate -- lasix 40mg  IV BID ordered by primary team -- will schedule for RHC tomorrow  Moderate right pleural effusion -- recurrent, has required thoracentesis in the past -- likely contributing to resp status -- Will schedule for thoracentesis with IR   HTN -- add coreg as above   A-fib s/p ablation 02/2022 -- on eliquis listed as 2.5mg  BID but does not meet criteria for reduced dose -- hold for possible thoracentesis and RHC -- per discussion with MD rule out pulmonary vein stenosis at the time of RHC given history of ablation   Severe TR -- on echo 05/2022    Risk Assessment/Risk Scores:    New York Heart Association (NYHA) Functional Class NYHA Class III        For questions or updates, please contact [facility] Please consult [email or web address] for contact info under    Signed, Osborne Oman, RN, Student NP  09/05/2022 2:44 PM

## 2022-09-05 NOTE — ED Provider Notes (Signed)
Laurie James Provider Note   CSN: [id number] Arrival date & time: 09/05/22  1238     History {Add pertinent medical, surgical, social history, OB history to HPI:1} Chief Complaint  Patient presents with   Shortness of Breath    Laurie James is a 77 y.o. female.  HPI 77 year old female history of A-fib, CHF, presents with increased dyspnea over the past several months.  She reports taking medications as prescribed.  She denies any recent changes in medication.  She denies any chest pain.  She states that she has had worsening shortness of breath.  She denies fever, chills, cough.  Home Medications Prior to Admission medications   Medication Sig Start Date End Date Taking? Authorizing Provider  albuterol (VENTOLIN HFA) 108 (90 Base) MCG/ACT inhaler Inhale 2 puffs into the lungs every 4 (four) hours as needed for wheezing. 06/08/20   [provider]  apixaban (ELIQUIS) 2.5 MG TABS tablet Take 1 tablet (2.5 mg total) by mouth 2 (two) times daily. 01/17/22   Bensimhon, Bevelyn Buckles, MD  atorvastatin (LIPITOR) 40 MG tablet Take 1 tablet (40 mg total) by mouth daily. 09/05/22   Wendall Stade, MD  cyanocobalamin (,VITAMIN B-12,) 1000 MCG/ML injection Inject 1,000 mcg into the muscle every 30 (thirty) days. 06/11/16   [provider]  furosemide (LASIX) 20 MG tablet Take 1 tablet (20 mg total) by mouth as needed. Leg edema 12/30/21   Jacklynn Ganong, FNP  nitroGLYCERIN (NITROSTAT) 0.4 MG SL tablet Place 1 tablet (0.4 mg total) under the tongue every 5 (five) minutes x 3 doses as needed for chest pain. 10/11/20   Filbert Schilder, NP  ondansetron (ZOFRAN) 4 MG tablet Take 4 mg by mouth 3 (three) times daily as needed for nausea or vomiting. 11/22/20   [provider]  pantoprazole (PROTONIX) 40 MG tablet Take 1 tablet (40 mg total) by mouth 2 (two) times daily. 09/05/22   Bensimhon, Bevelyn Buckles, MD  polyethylene glycol  (MIRALAX / GLYCOLAX) 17 g packet Take 17 g by mouth daily. 05/04/22   Dorcas Carrow, MD  potassium chloride SA (KLOR-CON M) 20 MEQ tablet Take 2 tablets (40 mEq total) by mouth daily for 3 days. 05/03/22 05/06/22  Dorcas Carrow, MD      Allergies    Codeine    Review of Systems   Review of Systems  Physical Exam Updated Vital Signs BP (!) 165/91 (BP Location: Right Arm)   Pulse 65   Temp 98.3 F (36.8 C) (Oral)   Resp 17   Ht 1.422 m (4\' 8" )   Wt 48.1 kg   SpO2 99%   BMI 23.76 kg/m  Physical Exam Vitals reviewed.  HENT:     Head: Normocephalic.     Mouth/Throat:     Mouth: Mucous membranes are moist.  Cardiovascular:     Rate and Rhythm: Normal rate and regular rhythm.  Pulmonary:     Comments: Some decreased breath sounds at right base Abdominal:     Palpations: Abdomen is soft.  Musculoskeletal:     Cervical back: Normal range of motion.     Right lower leg: Edema present.     Left lower leg: Edema present.     Comments: Trace edema bilateral lower extremities  Skin:    General: Skin is warm.     Capillary Refill: Capillary refill takes less than 2 seconds.  Neurological:     General: No focal deficit  present.     Mental Status: She is alert.  Psychiatric:        Mood and Affect: Mood normal.     ED Results / Procedures / Treatments   Labs (all labs ordered are listed, but only abnormal results are displayed) Labs Reviewed  BASIC METABOLIC PANEL - Abnormal; Notable for the following components:      Result Value   Potassium 3.4 (*)    Glucose, Bld 140 (*)    Calcium 8.6 (*)    GFR, Estimated 58 (*)    All other components within normal limits  CBC - Abnormal; Notable for the following components:   RBC 3.56 (*)    Hemoglobin 9.7 (*)    HCT 32.1 (*)    RDW 18.3 (*)    All other components within normal limits  BRAIN NATRIURETIC PEPTIDE - Abnormal; Notable for the following components:   B Natriuretic Peptide 1,069.2 (*)    All other components  within normal limits    EKG EKG Interpretation  Date/Time:  Tuesday Sep 05 2022 12:45:23 EDT Ventricular Rate:  73 PR Interval:  128 QRS Duration: 92 QT Interval:  432 QTC Calculation: 475 R Axis:   49 Text Interpretation: Sinus rhythm with Premature atrial complexes ST & T wave abnormality, consider lateral ischemia Abnormal ECG When compared with ECG of 31-May-2022 12:00, PREVIOUS ECG IS PRESENT No significant change since last tracing Confirmed by Margarita Grizzle [phone]) on 09/05/2022 2:14:42 PM  Radiology DG Chest 2 View  Result Date: 09/05/2022 CLINICAL DATA:  Shortness of breath EXAM: CHEST - 2 VIEW COMPARISON:  Chest x-Syla Devoss dated May 02, 2018 FINDINGS: Cardiac and mediastinal contours are unchanged. Unchanged biapical pleural-parenchymal scarring. New moderate right pleural effusion. Small left pleural effusion, slightly increased in size when compared with the prior exam. No evidence of pneumothorax. Levocurvature of the thoracic spine. IMPRESSION: 1. New moderate right pleural effusion. 2. Small left pleural effusion, slightly increased in size when compared with the prior exam. Electronically Signed   By: Allegra Lai M.D.   On: 09/05/2022 13:25    Procedures Procedures  {Document cardiac monitor, telemetry assessment procedure when appropriate:1}  Medications Ordered in ED Medications  furosemide (LASIX) injection 40 mg (has no administration in time range)    ED Course/ Medical Decision Making/ A&P   {   Click here for ABCD2, HEART and other calculatorsREFRESH Note before signing :1}                          Medical Decision Making Amount and/or Complexity of Data Reviewed Labs: ordered. Radiology: ordered.  Risk Prescription drug management.  77 year old female history of A-fib, CHF presents with increasing dyspnea.  Here in the emergency department she is evaluated with chest x-Lindsey Hommel which shows moderate right-sided effusion with increased interstitial  markings Hemoglobin is 9.7 BNP is elevated at 1069 Patient is hypertensive blood pressure 165/91 Heart rate is normal at 65. Patient is diuresed here with 40 mg of Lasix Plan consult cardiology for further evaluation and management Cardiology will see and evaluate They request medicine admission   Note from cardiology office and last echo findings below Atrial fibrillation was first diagnosed in June 2022 and managed with amiodarone.  Amiodarone was discontinued due to severe COPD.  A-fib recurred in May 2023 but was well rate controlled at that time.  She had recurrence off of amiodarone, admitted with A-fib and RVR with rates up to 150s, CHF.  Amiodarone was restarted, but she was thought not to be a candidate to to potential pulmonary toxicity.  She underwent uncomplicated atrial fibrillation ablation in November 2024.  She reports that since the ablation she has felt very fatigued and weak.  She thinks that her symptoms are worse now than they were prior to the ablation.  Admitted for heart failure in January but remained in sinus rhythm throughout the admission. Echo 2/ 21/24  INDINGS   Left Ventricle: Left ventricular ejection fraction, by estimation, is 50  to 55%. The left ventricle has low normal function. The left ventricle has  no regional wall motion abnormalities. The left ventricular internal  cavity size was normal in size.  There is no left ventricular hypertrophy. Left ventricular diastolic  parameters are indeterminate.  {Document critical care time when appropriate:1} {Document review of labs and clinical decision tools ie heart score, Chads2Vasc2 etc:1}  {Document your independent review of radiology images, and any outside records:1} {Document your discussion with family members, caretakers, and with consultants:1} {Document social determinants of health affecting pt's care:1} {Document your decision making why or why not admission, treatments were needed:1} Final  Clinical Impression(s) / ED Diagnoses Final diagnoses:  None    Rx / DC Orders ED Discharge Orders     None

## 2022-09-05 NOTE — ED Notes (Signed)
ED TO INPATIENT HANDOFF REPORT  ED Nurse Name and Phone #: [phone]  S Name/Age/Gender Laurie James 77 y.o. female Room/Bed: RESUSC/RESUSC  Code Status   Code Status: Full Code  Home/SNF/Other Home Patient oriented to: self, place, and situation Is this baseline? Yes   Triage Complete: Triage complete  Chief Complaint Acute on chronic systolic (congestive) heart failure (HCC) [I50.23]  Triage Note Pt arrives via EMS from home. Pt reports worsening sob and generalized weakness over the past couple of months. Hx of CHF. Pt denies cp or other associated symptoms.  Pt AxOx4. Pt states she is legally blind.    Allergies Allergies  Allergen Reactions   Codeine Nausea And Vomiting and Other (See Comments)    Upsets the stomach    Level of Care/Admitting Diagnosis ED Disposition     ED Disposition  Admit   Condition  --   Comment  Hospital Area: [facility] [[id number]]  Level of Care: Telemetry Cardiac [[id number]]  May place patient in observation at [facility] or [facility] if equivalent level of care is available:: No  Covid Evaluation: Asymptomatic - no recent exposure (last 10 days) testing not required  Diagnosis: Acute on chronic systolic (congestive) heart failure [facility]) [[id number]]  Admitting Physician: Synetta Fail [[id number]]  Attending Physician: Synetta Fail [phone]          B Medical/Surgery History Past Medical History:  Diagnosis Date   AMD (age related macular degeneration)    COPD (chronic obstructive pulmonary disease) (HCC)    Depression    Hiatal hernia    Intermittent low back pain    Memory impairment    Midsternal chest pain    a. 12/2011 Cardiac CTA Ca++ score of 103.3 (80th %), LAD <50p/m, RCA 50-75.   Osteoporosis    PAF (paroxysmal atrial fibrillation) (HCC) 09/2020   Paroxysmal atrial fibrillation with RVR (HCC) 11/28/2021   Vitamin B 12 deficiency    Past Surgical History:  Procedure  Laterality Date   ABDOMINAL HYSTERECTOMY     ATRIAL FIBRILLATION ABLATION N/A 03/08/2022   Procedure: ATRIAL FIBRILLATION ABLATION;  Surgeon: Maurice Small, MD;  [facility]: [facility] INVASIVE CV LAB;  Service: Cardiovascular;  Laterality: N/A;   IR THORACENTESIS ASP PLEURAL SPACE W/IMG GUIDE  10/20/2020   IR THORACENTESIS ASP PLEURAL SPACE W/IMG GUIDE  05/02/2022   RIGHT/LEFT HEART CATH AND CORONARY ANGIOGRAPHY N/A 10/07/2020   Procedure: RIGHT/LEFT HEART CATH AND CORONARY ANGIOGRAPHY;  Surgeon: Runell Gess, MD;  Location: [facility] INVASIVE CV LAB;  Service: Cardiovascular;  Laterality: N/A;   TEE WITHOUT CARDIOVERSION N/A 10/25/2020   Procedure: TRANSESOPHAGEAL ECHOCARDIOGRAM (TEE);  Surgeon: Quintella Reichert, MD;  Location: [facility] ENDOSCOPY;  Service: Cardiovascular;  Laterality: N/A;     A IV Location/Drains/Wounds Patient Lines/Drains/Airways Status     Active Line/Drains/Airways     Name Placement date Placement time Site Days   Peripheral IV 09/05/22 20 G Anterior;Left Forearm 09/05/22  1528  Forearm  less than 1   Wound / Incision (Open or Dehisced) 10/20/20 Skin tear Pretibial Left 3cm skin tear to left shin 10/20/20  1055  Pretibial  685            Intake/Output Last 24 hours No intake or output data in the 24 hours ending 09/05/22 1529  Labs/Imaging Results for orders placed or performed during the hospital encounter of 09/05/22 (from the past 48 hour(s))  Basic metabolic panel     Status: Abnormal  Collection Time: 09/05/22 12:51 PM  Result Value Ref Range   Sodium 136 135 - 145 mmol/L   Potassium 3.4 (L) 3.5 - 5.1 mmol/L   Chloride 104 98 - 111 mmol/L   CO2 22 22 - 32 mmol/L   Glucose, Bld 140 (H) 70 - 99 mg/dL    Comment: Glucose reference range applies only to samples taken after fasting for at least 8 hours.   BUN 17 8 - 23 mg/dL   Creatinine, Ser 1.61 0.44 - 1.00 mg/dL   Calcium 8.6 (L) 8.9 - 10.3 mg/dL   GFR, Estimated 58 (L) >60 mL/min    Comment:  (NOTE) Calculated using the CKD-EPI Creatinine Equation (2021)    Anion gap 10 5 - 15    Comment: Performed at [facility], [address], [place], [place] [address]  CBC     Status: Abnormal   Collection Time: 09/05/22 12:51 PM  Result Value Ref Range   WBC 7.4 4.0 - 10.5 K/uL   RBC 3.56 (L) 3.87 - 5.11 MIL/uL   Hemoglobin 9.7 (L) 12.0 - 15.0 g/dL   HCT 54.0 (L) 98.1 - 19.1 %   MCV 90.2 80.0 - 100.0 fL   MCH 27.2 26.0 - 34.0 pg   MCHC 30.2 30.0 - 36.0 g/dL   RDW 47.8 (H) 29.5 - 62.1 %   Platelets 281 150 - 400 K/uL   nRBC 0.0 0.0 - 0.2 %    Comment: Performed at [facility], [address], [place], [place] [address]  Brain natriuretic peptide     Status: Abnormal   Collection Time: 09/05/22 12:51 PM  Result Value Ref Range   B Natriuretic Peptide 1,069.2 (H) 0.0 - 100.0 pg/mL    Comment: Performed at [facility], [address] [place], [place] [address]   DG Chest 2 View  Result Date: 09/05/2022 CLINICAL DATA:  Shortness of breath EXAM: CHEST - 2 VIEW COMPARISON:  Chest x-ray dated May 02, 2018 FINDINGS: Cardiac and mediastinal contours are unchanged. Unchanged biapical pleural-parenchymal scarring. New moderate right pleural effusion. Small left pleural effusion, slightly increased in size when compared with the prior exam. No evidence of pneumothorax. Levocurvature of the thoracic spine. IMPRESSION: 1. New moderate right pleural effusion. 2. Small left pleural effusion, slightly increased in size when compared with the prior exam. Electronically Signed   By: Allegra Lai M.D.   On: 09/05/2022 13:25    Pending Labs Unresulted Labs (From admission, onward)     Start     Ordered   09/06/22 0500  Comprehensive metabolic panel  Tomorrow morning,   R        09/05/22 1524   09/06/22 0500  CBC  Tomorrow morning,   R        09/05/22 1524   09/05/22 1525  Magnesium  Add-on,   AD        09/05/22 1524            Vitals/Pain Today's Vitals    09/05/22 1239 09/05/22 1240 09/05/22 1242  BP:  (!) 165/91   Pulse:  65   Resp:  17   Temp:  98.3 F (36.8 C)   TempSrc:  Oral   SpO2: 95% 99%   Weight:   106 lb (48.1 kg)  Height:   4\' 8"  (1.422 m)  PainSc:   0-No pain    Isolation Precautions No active isolations  Medications Medications  furosemide (LASIX) injection 40 mg (  has no administration in time range)  atorvastatin (LIPITOR) tablet 40 mg (has no administration in time range)  pantoprazole (PROTONIX) EC tablet 40 mg (has no administration in time range)  apixaban (ELIQUIS) tablet 2.5 mg (has no administration in time range)  albuterol (VENTOLIN HFA) 108 (90 Base) MCG/ACT inhaler 2 puff (has no administration in time range)  furosemide (LASIX) injection 40 mg (has no administration in time range)  sodium chloride flush (NS) 0.9 % injection 3 mL (has no administration in time range)  acetaminophen (TYLENOL) tablet 650 mg (has no administration in time range)    Or  acetaminophen (TYLENOL) suppository 650 mg (has no administration in time range)  polyethylene glycol (MIRALAX / GLYCOLAX) packet 17 g (has no administration in time range)  potassium chloride SA (KLOR-CON M) CR tablet 40 mEq (has no administration in time range)    Mobility walks with person assist     Focused Assessments Pulmonary Assessment Handoff:  Lung sounds:  diminished O2 Device: Room Air      R Recommendations: See Admitting Provider Note  Report given to:   Additional Notes: [id number]

## 2022-09-05 NOTE — ED Triage Notes (Addendum)
Pt arrives via EMS from home. Pt reports worsening sob and generalized weakness over the past couple of months. Hx of CHF. Pt denies cp or other associated symptoms.  Pt AxOx4. Pt states she is legally blind.

## 2022-09-05 NOTE — H&P (Addendum)
History and Physical   Briseis Hey Cletis Ingber ZOX:[id number] DOB: 1945/11/30 DOA: 09/05/2022  PCP: Georgann Housekeeper, MD   Patient coming from: Home  Chief Complaint: Shortness of breath  HPI: Laurie James is a 77 y.o. female with medical history significant of mild cognitive impairment, tricuspid regurgitation, CHF, OSA, pulmonary hypertension, hyperlipidemia, gout, GERD, paroxysmal A-fib, macular degeneration, depression, COPD, CAD presenting with shortness of breath.  Patient reports gradual worsening shortness of breath for the past several months.  She also is reporting some generalized weakness/fatigue.  She has not noticed any increased edema.  She has not noticed any orthopnea however she has an inclined bed at home and has not laid flat.  She additionally has not noticed any significant weight gain.  She denies fevers, chills, chest pain, abdominal pain, constipation, diarrhea, nausea, vomiting.  ED Course: Vital signs in the ED notable for blood pressure in the 160s.  Lab workup included BMP with potassium 3.4, glucose 140, calcium 8.6.  CBC with hemoglobin stable 9.7.  BNP elevated to 1069.  Chest x-ray with new moderate right pleural effusion and small left pleural effusion that is slightly increased from previous.  Patient received Lasix 40 mg IV in the ED.  Cardiology consulted and will see the patient.  Review of Systems: As per HPI otherwise all other systems reviewed and are negative.  Past Medical History:  Diagnosis Date   AMD (age related macular degeneration)    COPD (chronic obstructive pulmonary disease) (HCC)    Depression    Hiatal hernia    Intermittent low back pain    Memory impairment    Midsternal chest pain    a. 12/2011 Cardiac CTA Ca++ score of 103.3 (80th %), LAD <50p/m, RCA 50-75.   Osteoporosis    PAF (paroxysmal atrial fibrillation) (HCC) 09/2020   Paroxysmal atrial fibrillation with RVR (HCC) 11/28/2021   Vitamin B 12 deficiency      Past Surgical History:  Procedure Laterality Date   ABDOMINAL HYSTERECTOMY     ATRIAL FIBRILLATION ABLATION N/A 03/08/2022   Procedure: ATRIAL FIBRILLATION ABLATION;  Surgeon: Maurice Small, MD;  Location: [facility] INVASIVE CV LAB;  Service: Cardiovascular;  Laterality: N/A;   IR THORACENTESIS ASP PLEURAL SPACE W/IMG GUIDE  10/20/2020   IR THORACENTESIS ASP PLEURAL SPACE W/IMG GUIDE  05/02/2022   RIGHT/LEFT HEART CATH AND CORONARY ANGIOGRAPHY N/A 10/07/2020   Procedure: RIGHT/LEFT HEART CATH AND CORONARY ANGIOGRAPHY;  Surgeon: Runell Gess, MD;  Location: [facility] INVAS[facility] CV LAB;  Service: Cardiovascular;  Laterality: N/A;   TEE WITHOUT CARDIOVERSION N/A 10/25/2020   Procedure: TRANSESOPHAGEAL ECHOCARDIOGRAM (TEE);  Surgeon: Quintella Reichert, MD;  Location: [facility] ENDOSCOPY;  Service: Cardiovascular;  Laterality: N/A;    Social History  reports that she quit smoking about 1 years ago. Her smoking use included cigarettes. She has a 65.00 pack-year smoking history. She has never used smokeless tobacco. She reports that she does not drink alcohol and does not use drugs.  Allergies  Allergen Reactions   Codeine Nausea And Vomiting and Other (See Comments)    Upsets the stomach    Family History  Problem Relation Age of Onset   Breast cancer Sister    Breast cancer Sister   Reviewed on admission  Prior to Admission medications   Medication Sig Start Date End Date Taking? Authorizing Provider  albuterol (VENTOLIN HFA) 108 (90 Base) MCG/ACT inhaler Inhale 2 puffs into the lungs every 4 (four) hours as needed for wheezing. 06/08/20  [provider]  apixaban (ELIQUIS) 2.5 MG TABS tablet Take 1 tablet (2.5 mg total) by mouth 2 (two) times daily. 01/17/22   Bensimhon, Bevelyn Buckles, MD  atorvastatin (LIPITOR) 40 MG tablet Take 1 tablet (40 mg total) by mouth daily. 09/05/22   Wendall Stade, MD  cyanocobalamin (,VITAMIN B-12,) 1000 MCG/ML injection Inject 1,000 mcg into the muscle every 30  (thirty) days. 06/11/16   [provider]  furosemide (LASIX) 20 MG tablet Take 1 tablet (20 mg total) by mouth as needed. Leg edema 12/30/21   Jacklynn Ganong, FNP  nitroGLYCERIN (NITROSTAT) 0.4 MG SL tablet Place 1 tablet (0.4 mg total) under the tongue every 5 (five) minutes x 3 doses as needed for chest pain. 10/11/20   Filbert Schilder, NP  ondansetron (ZOFRAN) 4 MG tablet Take 4 mg by mouth 3 (three) times daily as needed for nausea or vomiting. 11/22/20   [provider]  pantoprazole (PROTONIX) 40 MG tablet Take 1 tablet (40 mg total) by mouth 2 (two) times daily. 09/05/22   Bensimhon, Bevelyn Buckles, MD  polyethylene glycol (MIRALAX / GLYCOLAX) 17 g packet Take 17 g by mouth daily. 05/04/22   Dorcas Carrow, MD  potassium chloride SA (KLOR-CON M) 20 MEQ tablet Take 2 tablets (40 mEq total) by mouth daily for 3 days. 05/03/22 05/06/22  Dorcas Carrow, MD    Physical Exam: Vitals:   09/05/22 1239 09/05/22 1240 09/05/22 1242  BP:  (!) 165/91   Pulse:  65   Resp:  17   Temp:  98.3 F (36.8 C)   TempSrc:  Oral   SpO2: 95% 99%   Weight:   48.1 kg  Height:   4\' 8"  (1.422 m)    Physical Exam Constitutional:      General: She is not in acute distress.    Appearance: Normal appearance.  HENT:     Head: Normocephalic and atraumatic.     Mouth/Throat:     Mouth: Mucous membranes are moist.     Pharynx: Oropharynx is clear.  Eyes:     Extraocular Movements: Extraocular movements intact.     Pupils: Pupils are equal, round, and reactive to light.  Cardiovascular:     Rate and Rhythm: Normal rate and regular rhythm.     Pulses: Normal pulses.     Heart sounds: Normal heart sounds.  Pulmonary:     Effort: Pulmonary effort is normal. No respiratory distress.     Breath sounds: Rales present.  Abdominal:     General: Bowel sounds are normal. There is no distension.     Palpations: Abdomen is soft.     Tenderness: There is no abdominal tenderness.  Musculoskeletal:         General: No swelling or deformity.  Skin:    General: Skin is warm and dry.  Neurological:     General: No focal deficit present.     Mental Status: Mental status is at baseline.    Labs on Admission: I have personally reviewed following labs and imaging studies  CBC: Recent Labs  Lab 09/05/22 1251  WBC 7.4  HGB 9.7*  HCT 32.1*  MCV 90.2  PLT 281    Basic Metabolic Panel: Recent Labs  Lab 09/05/22 1251  NA 136  K 3.4*  CL 104  CO2 22  GLUCOSE 140*  BUN 17  CREATININE 1.00  CALCIUM 8.6*    GFR: Estimated Creatinine Clearance: 31 mL/min (by C-G formula based on SCr of  1 mg/dL).  Liver Function Tests: No results for input(s): "AST", "ALT", "ALKPHOS", "BILITOT", "PROT", "ALBUMIN" in the last 168 hours.  Urine analysis:    Component Value Date/Time   COLORURINE YELLOW 05/01/2022 1922   APPEARANCEUR HAZY (A) 05/01/2022 1922   LABSPEC 1.029 05/01/2022 1922   PHURINE 5.0 05/01/2022 1922   GLUCOSEU 50 (A) 05/01/2022 1922   HGBUR SMALL (A) 05/01/2022 1922   BILIRUBINUR NEGATIVE 05/01/2022 1922   KETONESUR NEGATIVE 05/01/2022 1922   PROTEINUR 100 (A) 05/01/2022 1922   NITRITE NEGATIVE 05/01/2022 1922   LEUKOCYTESUR NEGATIVE 05/01/2022 1922    Radiological Exams on Admission: DG Chest 2 View  Result Date: 09/05/2022 CLINICAL DATA:  Shortness of breath EXAM: CHEST - 2 VIEW COMPARISON:  Chest x-ray dated May 02, 2018 FINDINGS: Cardiac and mediastinal contours are unchanged. Unchanged biapical pleural-parenchymal scarring. New moderate right pleural effusion. Small left pleural effusion, slightly increased in size when compared with the prior exam. No evidence of pneumothorax. Levocurvature of the thoracic spine. IMPRESSION: 1. New moderate right pleural effusion. 2. Small left pleural effusion, slightly increased in size when compared with the prior exam. Electronically Signed   By: Allegra Lai M.D.   On: 09/05/2022 13:25    EKG: Independently reviewed.  Sinus  rhythm at 73 bpm.  Some baseline wander.  PAC noted.  Assessment/Plan Principal Problem:   Acute on chronic systolic (congestive) heart failure (HCC) Active Problems:   COPD (chronic obstructive pulmonary disease) (HCC)   HLD (hyperlipidemia)   Macular degeneration   MCI (mild cognitive impairment)   CAD (coronary artery disease)   Tricuspid regurgitation   Depression   OSA (obstructive sleep apnea)   Paroxysmal atrial fibrillation (HCC)   GERD without esophagitis   Gout   Acute on chronic systolic CHF > Last echo in February with EF 50-55%, indeterminate diastolic function, normal RV function. > Patient with gradually worsening shortness of breath for a month or so. > Does have underlying A-fib but is status post ablation in November of last year. > Chest x-ray with new moderate right pleural effusion and slightly increased small left pleural effusion. > BNP elevated to 1069. > Rales on exam > Cardiology consulted in the ED - Monitor on telemetry overnight - Appreciate cardiology recommendations - Continue with 40 mg IV Lasix twice daily - Trend renal function and electrolytes - Check magnesium - Strict I's and O's, daily weights - Recent echo, so we will hold off on repeat - Supplement potassium as below  Pleural effusion > Has history of recurrent pleural effusion requiring thoracentesis in the past. > Has mild worsening of small left pleural effusion with new moderate right pleural effusion > May benefit from repeat thoracentesis as this is likely contributing to her symptoms. - Cardiology has ordered IR evaluation  Hypokalemia > Mildly low potassium at 3.4. - 40 mill equivalents supplemental potassium - Check magnesium  Paroxysmal atrial fibrillation > Diagnosed in 2022, was on amiodarone but this was discontinued due to COPD, had recurrence in 2023 and then an episode with RVR.  Now status post ablation in November.   - Continue home  Eliquis  Hyperlipidemia CAD - Continue home atorvastatin  GERD - Continue home PPI  OSA Pulmonary hypertension > History of these noted, not on CPAP.  COPD - As needed albuterol  DVT prophylaxis: Eliquis Code Status:   Full Family Communication:  None on admission  Disposition Plan:   Patient is from:  Home  Anticipated DC to:  Home  Anticipated DC date:  1 to 5 days  Anticipated DC barriers: None  Consults called:  Cardiology Admission status:  Observation, telemetry  Severity of Illness: The appropriate patient status for this patient is OBSERVATION. Observation status is judged to be reasonable and necessary in order to provide the required intensity of service to ensure the patient's safety. The patient's presenting symptoms, physical exam findings, and initial radiographic and laboratory data in the context of their medical condition is felt to place them at decreased risk for further clinical deterioration. Furthermore, it is anticipated that the patient will be medically stable for discharge from the hospital within 2 midnights of admission.    Synetta Fail MD [facility]  How to contact the [facility] Attending or Consulting provider 7A - 7P or covering provider during after hours 7P -7A, for this patient?   Check the care team in [facility] and look for a) attending/consulting [facility] provider listed and b) the [facility] team listed Log into [email or web address] and use 's universal password to access. If you do not have the password, please contact the hospital operator. Locate the [facility] provider you are looking for under [facility] and page to a number that you can be directly reached. If you still have difficulty reaching the provider, please page the [facility] (Director on Call) for the Hospitalists listed on amion for assistance.  09/05/2022, 3:26 PM

## 2022-09-05 NOTE — ED Notes (Signed)
Patient transported to X-ray 

## 2022-09-06 ENCOUNTER — Encounter (HOSPITAL_COMMUNITY): Payer: Self-pay | Admitting: Cardiology

## 2022-09-06 ENCOUNTER — Observation Stay (HOSPITAL_COMMUNITY): Payer: Medicare PPO

## 2022-09-06 ENCOUNTER — Encounter (HOSPITAL_COMMUNITY)
Admission: EM | Disposition: A | Discharge status: Home Health | Payer: Self-pay | Source: Home / Self Care | Attending: Internal Medicine

## 2022-09-06 DIAGNOSIS — I130 Hypertensive heart and chronic kidney disease with heart failure and stage 1 through stage 4 chronic kidney disease, or unspecified chronic kidney disease: Secondary | ICD-10-CM | POA: Diagnosis present

## 2022-09-06 DIAGNOSIS — E785 Hyperlipidemia, unspecified: Secondary | ICD-10-CM | POA: Diagnosis present

## 2022-09-06 DIAGNOSIS — I272 Pulmonary hypertension, unspecified: Secondary | ICD-10-CM | POA: Diagnosis present

## 2022-09-06 DIAGNOSIS — K219 Gastro-esophageal reflux disease without esophagitis: Secondary | ICD-10-CM | POA: Diagnosis present

## 2022-09-06 DIAGNOSIS — Z87891 Personal history of nicotine dependence: Secondary | ICD-10-CM | POA: Diagnosis not present

## 2022-09-06 DIAGNOSIS — J9601 Acute respiratory failure with hypoxia: Secondary | ICD-10-CM | POA: Diagnosis present

## 2022-09-06 DIAGNOSIS — I5043 Acute on chronic combined systolic (congestive) and diastolic (congestive) heart failure: Secondary | ICD-10-CM | POA: Diagnosis present

## 2022-09-06 DIAGNOSIS — I5023 Acute on chronic systolic (congestive) heart failure: Secondary | ICD-10-CM | POA: Diagnosis not present

## 2022-09-06 DIAGNOSIS — G3184 Mild cognitive impairment, so stated: Secondary | ICD-10-CM | POA: Diagnosis present

## 2022-09-06 DIAGNOSIS — E876 Hypokalemia: Secondary | ICD-10-CM | POA: Diagnosis present

## 2022-09-06 DIAGNOSIS — Z885 Allergy status to narcotic agent status: Secondary | ICD-10-CM | POA: Diagnosis not present

## 2022-09-06 DIAGNOSIS — I2781 Cor pulmonale (chronic): Secondary | ICD-10-CM | POA: Diagnosis present

## 2022-09-06 DIAGNOSIS — I5033 Acute on chronic diastolic (congestive) heart failure: Secondary | ICD-10-CM | POA: Diagnosis present

## 2022-09-06 DIAGNOSIS — J449 Chronic obstructive pulmonary disease, unspecified: Secondary | ICD-10-CM | POA: Diagnosis present

## 2022-09-06 DIAGNOSIS — H3530 Unspecified macular degeneration: Secondary | ICD-10-CM | POA: Diagnosis present

## 2022-09-06 DIAGNOSIS — G4733 Obstructive sleep apnea (adult) (pediatric): Secondary | ICD-10-CM | POA: Diagnosis present

## 2022-09-06 DIAGNOSIS — M109 Gout, unspecified: Secondary | ICD-10-CM | POA: Diagnosis present

## 2022-09-06 DIAGNOSIS — J90 Pleural effusion, not elsewhere classified: Secondary | ICD-10-CM | POA: Diagnosis not present

## 2022-09-06 DIAGNOSIS — I509 Heart failure, unspecified: Secondary | ICD-10-CM | POA: Insufficient documentation

## 2022-09-06 DIAGNOSIS — I480 Paroxysmal atrial fibrillation: Secondary | ICD-10-CM | POA: Diagnosis present

## 2022-09-06 DIAGNOSIS — I371 Nonrheumatic pulmonary valve insufficiency: Secondary | ICD-10-CM | POA: Diagnosis present

## 2022-09-06 DIAGNOSIS — E871 Hypo-osmolality and hyponatremia: Secondary | ICD-10-CM | POA: Diagnosis present

## 2022-09-06 DIAGNOSIS — I071 Rheumatic tricuspid insufficiency: Secondary | ICD-10-CM | POA: Diagnosis present

## 2022-09-06 DIAGNOSIS — E875 Hyperkalemia: Secondary | ICD-10-CM | POA: Diagnosis present

## 2022-09-06 DIAGNOSIS — N1832 Chronic kidney disease, stage 3b: Secondary | ICD-10-CM | POA: Diagnosis present

## 2022-09-06 DIAGNOSIS — I2510 Atherosclerotic heart disease of native coronary artery without angina pectoris: Secondary | ICD-10-CM | POA: Diagnosis present

## 2022-09-06 DIAGNOSIS — J410 Simple chronic bronchitis: Secondary | ICD-10-CM | POA: Diagnosis not present

## 2022-09-06 DIAGNOSIS — F32A Depression, unspecified: Secondary | ICD-10-CM | POA: Diagnosis present

## 2022-09-06 DIAGNOSIS — I428 Other cardiomyopathies: Secondary | ICD-10-CM | POA: Diagnosis present

## 2022-09-06 HISTORY — PX: RIGHT HEART CATH: CATH118263

## 2022-09-06 LAB — POCT I-STAT EG7
Acid-Base Excess: 1 mmol/L (ref 0.0–2.0)
Acid-Base Excess: 1 mmol/L (ref 0.0–2.0)
Bicarbonate: 26.1 mmol/L (ref 20.0–28.0)
Bicarbonate: 26.3 mmol/L (ref 20.0–28.0)
Calcium, Ion: 1.19 mmol/L (ref 1.15–1.40)
Calcium, Ion: 1.2 mmol/L (ref 1.15–1.40)
HCT: 29 % — ABNORMAL LOW (ref 36.0–46.0)
HCT: 29 % — ABNORMAL LOW (ref 36.0–46.0)
Hemoglobin: 9.9 g/dL — ABNORMAL LOW (ref 12.0–15.0)
Hemoglobin: 9.9 g/dL — ABNORMAL LOW (ref 12.0–15.0)
O2 Saturation: 41 %
O2 Saturation: 41 %
Potassium: 4.6 mmol/L (ref 3.5–5.1)
Potassium: 4.6 mmol/L (ref 3.5–5.1)
Sodium: 142 mmol/L (ref 135–145)
Sodium: 142 mmol/L (ref 135–145)
TCO2: 27 mmol/L (ref 22–32)
TCO2: 28 mmol/L (ref 22–32)
pCO2, Ven: 43.3 mmHg — ABNORMAL LOW (ref 44–60)
pCO2, Ven: 43.4 mmHg — ABNORMAL LOW (ref 44–60)
pH, Ven: 7.388 (ref 7.25–7.43)
pH, Ven: 7.391 (ref 7.25–7.43)
pO2, Ven: 24 mmHg — CL (ref 32–45)
pO2, Ven: 24 mmHg — CL (ref 32–45)

## 2022-09-06 LAB — COMPREHENSIVE METABOLIC PANEL
ALT: 13 U/L (ref 0–44)
AST: 22 U/L (ref 15–41)
Albumin: 2.7 g/dL — ABNORMAL LOW (ref 3.5–5.0)
Alkaline Phosphatase: 76 U/L (ref 38–126)
Anion gap: 8 (ref 5–15)
BUN: 18 mg/dL (ref 8–23)
CO2: 24 mmol/L (ref 22–32)
Calcium: 8.5 mg/dL — ABNORMAL LOW (ref 8.9–10.3)
Chloride: 107 mmol/L (ref 98–111)
Creatinine, Ser: 1.16 mg/dL — ABNORMAL HIGH (ref 0.44–1.00)
GFR, Estimated: 49 mL/min — ABNORMAL LOW (ref >60)
Glucose, Bld: 89 mg/dL (ref 70–99)
Potassium: 4 mmol/L (ref 3.5–5.1)
Sodium: 139 mmol/L (ref 135–145)
Total Bilirubin: 1.3 mg/dL — ABNORMAL HIGH (ref 0.3–1.2)
Total Protein: 6.9 g/dL (ref 6.5–8.1)

## 2022-09-06 LAB — CBC
HCT: 27.1 % — ABNORMAL LOW (ref 36.0–46.0)
Hemoglobin: 8.5 g/dL — ABNORMAL LOW (ref 12.0–15.0)
MCH: 27.7 pg (ref 26.0–34.0)
MCHC: 31.4 g/dL (ref 30.0–36.0)
MCV: 88.3 fL (ref 80.0–100.0)
Platelets: 238 10*3/uL (ref 150–400)
RBC: 3.07 MIL/uL — ABNORMAL LOW (ref 3.87–5.11)
RDW: 18.5 % — ABNORMAL HIGH (ref 11.5–15.5)
WBC: 6.4 10*3/uL (ref 4.0–10.5)
nRBC: 0 % (ref 0.0–0.2)

## 2022-09-06 LAB — APTT: aPTT: 140 seconds — ABNORMAL HIGH (ref 24–36)

## 2022-09-06 SURGERY — RIGHT HEART CATH
Anesthesia: LOCAL

## 2022-09-06 MED ORDER — LIDOCAINE HCL 1 % IJ SOLN
INTRAMUSCULAR | Status: AC
  Filled 2022-09-06: qty 20

## 2022-09-06 MED ORDER — SODIUM CHLORIDE 0.9 % IV SOLN: INTRAVENOUS | Status: DC

## 2022-09-06 MED ORDER — ONDANSETRON HCL 4 MG/2ML IJ SOLN
INTRAMUSCULAR | Status: DC | PRN
  Administered 2022-09-06: 4 mg via INTRAVENOUS

## 2022-09-06 MED ORDER — SODIUM CHLORIDE 0.9 % IV SOLN
12.5000 mg | Freq: Four times a day (QID) | INTRAVENOUS | Status: DC | PRN
  Filled 2022-09-06: qty 0.5

## 2022-09-06 MED ORDER — FENTANYL CITRATE (PF) 100 MCG/2ML IJ SOLN
INTRAMUSCULAR | Status: AC
  Filled 2022-09-06: qty 2

## 2022-09-06 MED ORDER — HEPARIN (PORCINE) IN NACL 1000-0.9 UT/500ML-% IV SOLN
INTRAVENOUS | Status: DC | PRN
  Administered 2022-09-06: 500 mL

## 2022-09-06 MED ORDER — ONDANSETRON HCL 4 MG/2ML IJ SOLN
4.0000 mg | Freq: Four times a day (QID) | INTRAMUSCULAR | Status: DC | PRN
  Administered 2022-09-07: 4 mg via INTRAVENOUS
  Filled 2022-09-06 (×2): qty 2

## 2022-09-06 MED ORDER — LIDOCAINE HCL (PF) 1 % IJ SOLN
INTRAMUSCULAR | Status: AC
  Filled 2022-09-06: qty 30

## 2022-09-06 MED ORDER — ONDANSETRON HCL 4 MG/2ML IJ SOLN
INTRAMUSCULAR | Status: AC
  Filled 2022-09-06: qty 2

## 2022-09-06 MED ORDER — LIDOCAINE HCL (PF) 1 % IJ SOLN
INTRAMUSCULAR | Status: DC | PRN
  Administered 2022-09-06: 2 mL

## 2022-09-06 MED ORDER — HEPARIN (PORCINE) 25000 UT/250ML-% IV SOLN
550.0000 [IU]/h | INTRAVENOUS | Status: DC
  Administered 2022-09-06: 700 [IU]/h via INTRAVENOUS
  Filled 2022-09-06: qty 250

## 2022-09-06 MED ORDER — FENTANYL CITRATE (PF) 100 MCG/2ML IJ SOLN
INTRAMUSCULAR | Status: DC | PRN
  Administered 2022-09-06 (×2): 25 ug via INTRAVENOUS

## 2022-09-06 SURGICAL SUPPLY — 13 items
CATH BALLN WEDGE 5F 110CM (CATHETERS) IMPLANT
CATH SWAN GANZ 7F STRAIGHT (CATHETERS) IMPLANT
COVER SWIFTLINK CONNECTOR (BAG) IMPLANT
GUIDEWIRE .025 260CM (WIRE) IMPLANT
KIT MICROPUNCTURE NIT STIFF (SHEATH) IMPLANT
PACK CARDIAC CATHETERIZATION (CUSTOM PROCEDURE TRAY) ×2 IMPLANT
SHEATH GLIDE SLENDER 4/5FR (SHEATH) IMPLANT
SHEATH PINNACLE 5F 10CM (SHEATH) IMPLANT
SHEATH PINNACLE 7F 10CM (SHEATH) IMPLANT
TRANSDUCER W/STOPCOCK (MISCELLANEOUS) ×2 IMPLANT
TUBING ART PRESS 72  MALE/FEM (TUBING) ×1
TUBING ART PRESS 72 MALE/FEM (TUBING) IMPLANT
WIRE EMERALD 3MM-J .025X260CM (WIRE) IMPLANT

## 2022-09-06 NOTE — H&P (View-Only) (Signed)
 Cardiology:  Bensimohn/Laurie James  Subjective:  Still weak with dyspnea For IR thoracentesis this am and cath  Objective:  Vitals:   09/05/22 1935 09/06/22 0000 09/06/22 0445 09/06/22 0750  BP: 124/74 119/83 117/80 (!) 156/84  Pulse: 60 (!) 57 (!) 54 (!) 58  Resp: 18 18 18 16  Temp: 97.7 F (36.5 C) 97.7 F (36.5 C) 98.2 F (36.8 C) 98.5 F (36.9 C)  TempSrc: Oral Oral Oral Oral  SpO2: 99% 99% 99% 98%  Weight:   47.1 kg   Height:        Intake/Output from previous day:  Intake/Output Summary (Last 24 hours) at 09/06/2022 0752 Last data filed at 09/06/2022 0600 Gross per 24 hour  Intake 492.09 ml  Output 1050 ml  Net -557.91 ml    Physical Exam:  Rosacea poor vision Decrease BS mid/base right JVP elevated no murmur Abdomen benign Plus one edema with excoriations on legs Palpable pedal pulses   Lab Results: Basic Metabolic Panel: Recent Labs    09/05/22 1251 09/06/22 0103  NA 136 139  K 3.4* 4.0  CL 104 107  CO2 22 24  GLUCOSE 140* 89  BUN 17 18  CREATININE 1.00 1.16*  CALCIUM 8.6* 8.5*  MG 2.0  --    Liver Function Tests: Recent Labs    09/06/22 0103  AST 22  ALT 13  ALKPHOS 76  BILITOT 1.3*  PROT 6.9  ALBUMIN 2.7*   No results for input(s): "LIPASE", "AMYLASE" in the last 72 hours. CBC: Recent Labs    09/05/22 1251 09/06/22 0103  WBC 7.4 6.4  HGB 9.7* 8.5*  HCT 32.1* 27.1*  MCV 90.2 88.3  PLT 281 238     Imaging: DG Chest 2 View  Result Date: 09/05/2022 CLINICAL DATA:  Shortness of breath EXAM: CHEST - 2 VIEW COMPARISON:  Chest x-ray dated May 02, 2018 FINDINGS: Cardiac and mediastinal contours are unchanged. Unchanged biapical pleural-parenchymal scarring. New moderate right pleural effusion. Small left pleural effusion, slightly increased in size when compared with the prior exam. No evidence of pneumothorax. Levocurvature of the thoracic spine. IMPRESSION: 1. New moderate right pleural effusion. 2. Small left pleural effusion,  slightly increased in size when compared with the prior exam. Electronically Signed   By: Leah  Strickland M.D.   On: 09/05/2022 13:25    Cardiac Studies:  ECG: NSR/PAC;s no afib   Telemetry:NSR  Echo: EF 50-55%   Medications:    atorvastatin  40 mg Oral Daily   carvedilol  3.125 mg Oral BID WC   furosemide  40 mg Intravenous BID   pantoprazole  40 mg Oral BID   sodium chloride flush  3 mL Intravenous Q12H      sodium chloride 10 mL/hr at 09/06/22 0505    Assessment/Plan:   CHF:  disproportionate to LVEF reduction. She has had recurrent right pleural effusoin post ablation in November continue laisx IR thoracentesis this am Prior tap with transudate in January She has severe TR and moderate pulmonary HTN likely from COPD Not clear that she would be a [facility] candidate  For repeat right heart cath today with both right and left PCWP's consider CTA to assess for PV stenosis post ablation The left lower PV was small to start with but would likely need 2 or more stenotic veins to cause issues GDMT per CHF pending results of right heart cath Currently on coreg and laisx Dr Bensimohn has stopped Jardiacne, aldactone and entresto in past for repeated   renal failure and hyperkalemia  PAF:  maintaining NSR post ablation Has severe bi atrial enlargement Resume eliquis post thoracentesis and right heart cath   Laurie James 09/06/2022, 7:52 AM    

## 2022-09-06 NOTE — Assessment & Plan Note (Signed)
Follow up as outpatient.  

## 2022-09-06 NOTE — Assessment & Plan Note (Signed)
No signs of exacerbation, continue oxymetry monitoring.  Patient has chronic hypoxemic respiratory failure and is on home 02 (3 L/min).  

## 2022-09-06 NOTE — Assessment & Plan Note (Signed)
Continue with atorvastatin 

## 2022-09-06 NOTE — Progress Notes (Signed)
Pharmacy requests that RN call Pharmacy on 09/07/22 when time of Thoracentesis is known. Heparin needs to be stopped 1 hour before procedure on 5/30 per pharmacy. This will be passed to night shift RN in verbal handoff this evening.

## 2022-09-06 NOTE — Assessment & Plan Note (Addendum)
Amiodarone has been resumed for rate control. Anticoagulation with apixaban.  Patient had ablation in November 2023   Plan to continue with 200 mg of amiodarone daily until follow up with Dr Nelly Laurence, then possible change to 100 mg daily.

## 2022-09-06 NOTE — Progress Notes (Signed)
Contacted MD to clarify order for Droplet Precautions. MD states precautions can be discontinued.

## 2022-09-06 NOTE — Assessment & Plan Note (Addendum)
Patient had thoracentesis on 04/2022, removed 700 cc. (Transudate).   05/30 right thoracentesis 900 cc fluid removed, transudate per light's criteria.  Plan to keep negative fluid balance.  Patient with pulmonary hypertension, possible class 1 or class 3.

## 2022-09-06 NOTE — Progress Notes (Signed)
ANTICOAGULATION CONSULT NOTE - Initial Consult  Pharmacy Consult for heparin Indication: atrial fibrillation  Allergies  Allergen Reactions   Codeine Nausea And Vomiting and Other (See Comments)    Upsets the stomach    Patient Measurements: Height: 4\' 8"  (142.2 cm) Weight: 47.1 kg (103 lb 13.4 oz) IBW/kg (Calculated) : 36.3 Heparin Dosing Weight: 46kg  Vital Signs: Temp: 97.6 F (36.4 C) (05/29 1054) Temp Source: Oral (05/29 1054) BP: 143/70 (05/29 1054) Pulse Rate: 63 (05/29 1054)  Labs: Recent Labs    09/05/22 1251 09/06/22 0103  HGB 9.7* 8.5*  HCT 32.1* 27.1*  PLT 281 238  CREATININE 1.00 1.16*    Estimated Creatinine Clearance: 26.4 mL/min (A) (by C-G formula based on SCr of 1.16 mg/dL (H)).   Medical History: Past Medical History:  Diagnosis Date   AMD (age related macular degeneration)    COPD (chronic obstructive pulmonary disease) (HCC)    Depression    Hiatal hernia    Intermittent low back pain    Memory impairment    Midsternal chest pain    a. 12/2011 Cardiac CTA Ca++ score of 103.3 (80th %), LAD <50p/m, RCA 50-75.   Osteoporosis    PAF (paroxysmal atrial fibrillation) (HCC) 09/2020   Paroxysmal atrial fibrillation with RVR (HCC) 11/28/2021   Vitamin B 12 deficiency       Assessment: 77 yo W with afib on apixaban PTA, last dose 5/28 9am. Patient had RHC 5/29 with sheath removed at 10am. Pharmacy consulted for heparin to begin 4hr after cath and to stop 1hr after thoracentesis planned 5/30.    Asked RN to notify pharmacy of thoracentesis time 5/30.   Goal of Therapy:  Heparin level 0.3-0.7 units/ml Monitor platelets by anticoagulation protocol: Yes   Plan:  Start heparin 700 units/hr at 14:00 Stop 1 hour prior to thoracentesis  F/u 8hr APTT  F/u aPTT until correlates with heparin level  Monitor daily aPTT, heparin level, CBC Monitor for signs/symptoms of bleeding    Alphia Moh, PharmD, BCPS, BCCP Clinical Pharmacist  Please check  AMION for all [facility] Pharmacy phone numbers After 10:00 PM, call Main Pharmacy [phone]

## 2022-09-06 NOTE — Progress Notes (Signed)
   IR unable to get consent and pt now with sedation onboard with cardiac cath Rt.  Will resume Heparin and then stop tomorrow 1 hr prior to thoracentesis.   Dr. Eden Emms aware. RN made aware as well.   Nada Boozer, FNP-C At [facility]  Pgr:[phone] or after 5pm and on weekends call [phone] 09/06/2022.

## 2022-09-06 NOTE — Progress Notes (Signed)
Cardiology:  Bensimohn/Beckam Abdulaziz  Subjective:  Still weak with dyspnea For IR thoracentesis this am and cath  Objective:  Vitals:   09/05/22 1935 09/06/22 0000 09/06/22 0445 09/06/22 0750  BP: 124/74 119/83 117/80 (!) 156/84  Pulse: 60 (!) 57 (!) 54 (!) 58  Resp: 18 18 18 16   Temp: 97.7 F (36.5 C) 97.7 F (36.5 C) 98.2 F (36.8 C) 98.5 F (36.9 C)  TempSrc: Oral Oral Oral Oral  SpO2: 99% 99% 99% 98%  Weight:   47.1 kg   Height:        Intake/Output from previous day:  Intake/Output Summary (Last 24 hours) at 09/06/2022 0752 Last data filed at 09/06/2022 0600 Gross per 24 hour  Intake 492.09 ml  Output 1050 ml  Net -557.91 ml    Physical Exam:  Rosacea poor vision Decrease BS mid/base right JVP elevated no murmur Abdomen benign Plus one edema with excoriations on legs Palpable pedal pulses   Lab Results: Basic Metabolic Panel: Recent Labs    09/05/22 1251 09/06/22 0103  NA 136 139  K 3.4* 4.0  CL 104 107  CO2 22 24  GLUCOSE 140* 89  BUN 17 18  CREATININE 1.00 1.16*  CALCIUM 8.6* 8.5*  MG 2.0  --    Liver Function Tests: Recent Labs    09/06/22 0103  AST 22  ALT 13  ALKPHOS 76  BILITOT 1.3*  PROT 6.9  ALBUMIN 2.7*   No results for input(s): "LIPASE", "AMYLASE" in the last 72 hours. CBC: Recent Labs    09/05/22 1251 09/06/22 0103  WBC 7.4 6.4  HGB 9.7* 8.5*  HCT 32.1* 27.1*  MCV 90.2 88.3  PLT 281 238     Imaging: DG Chest 2 View  Result Date: 09/05/2022 CLINICAL DATA:  Shortness of breath EXAM: CHEST - 2 VIEW COMPARISON:  Chest x-ray dated May 02, 2018 FINDINGS: Cardiac and mediastinal contours are unchanged. Unchanged biapical pleural-parenchymal scarring. New moderate right pleural effusion. Small left pleural effusion, slightly increased in size when compared with the prior exam. No evidence of pneumothorax. Levocurvature of the thoracic spine. IMPRESSION: 1. New moderate right pleural effusion. 2. Small left pleural effusion,  slightly increased in size when compared with the prior exam. Electronically Signed   By: Allegra Lai M.D.   On: 09/05/2022 13:25    Cardiac Studies:  ECG: NSR/PAC;s no afib   Telemetry:NSR  Echo: EF 50-55%   Medications:    atorvastatin  40 mg Oral Daily   carvedilol  3.125 mg Oral BID WC   furosemide  40 mg Intravenous BID   pantoprazole  40 mg Oral BID   sodium chloride flush  3 mL Intravenous Q12H      sodium chloride 10 mL/hr at 09/06/22 0505    Assessment/Plan:   CHF:  disproportionate to LVEF reduction. She has had recurrent right pleural effusoin post ablation in November continue laisx IR thoracentesis this am Prior tap with transudate in January She has severe TR and moderate pulmonary HTN likely from COPD Not clear that she would be a Tri Clip candidate  For repeat right heart cath today with both right and left PCWP's consider CTA to assess for PV stenosis post ablation The left lower PV was small to start with but would likely need 2 or more stenotic veins to cause issues GDMT per CHF pending results of right heart cath Currently on coreg and laisx Dr Teressa Lower has stopped Jardiacne, aldactone and entresto in past for repeated  renal failure and hyperkalemia  PAF:  maintaining NSR post ablation Has severe bi atrial enlargement Resume eliquis post thoracentesis and right heart cath   Charlton Haws 09/06/2022, 7:52 AM

## 2022-09-06 NOTE — Progress Notes (Signed)
Progress Note   Patient: Laurie James ZOX:[id number] DOB: 1946-03-12 DOA: 09/05/2022     0 DOS: the patient was seen and examined on 09/06/2022   Brief hospital course: Mrs. Whobrey was admitted to the hospital with the working diagnosis of heart failure exacerbation.   77 yo female with the past medical history of heart failure, pulmonary hypertension, atrial fibrillation, COPD, coronary artery disease, and cognitive impairment who presented with dyspnea. Reported worsening dyspnea for several months, along with lower extremity edema that prompted to come to the hospital. On her initial physical examination her blood pressure was 165/91, HR 65. RR 17 and 02 saturation 99%, lungs had rales bilaterally, no wheezing, heart with S1 and S2 present and rhythmic, abdomen with no distention and no lower extremity edema.   Na 136, K 3,4 CL 104 bicarbonate 22, glucose 140 bun 17 cr 1,0  Mg 2.0  BNP 1,069  Wbc 7.4 hgb 9,7 plt 281   Chest radiograph with right rotation, mild hilar vascular congestion, positive bilateral pleural effusions, more right (moderate) than left (small).   EKG 73 bpm, normal axis, normal intervals, sinus rhythm with PAC, no significant ST segment changes, negative T wave V 4 to V 6.   Patient was placed on furosemide for diuresis.  IR consulted for right thoracentesis.   Assessment and Plan: * Acute on chronic diastolic CHF (congestive heart failure) (HCC) Echocardiogram with preserved LV systolic function EF 50 to 55%, no LVH, RV with moderate dilatation, RVSP 50,0 mmHg. LA and RA with severe dilatation, severe tricuspid regurgitation, mild to moderate pulmonic regurgitation.   05/28 cardiac catheterization.  RV 37/4  PA 43/10 mean 21 PCWP 11 PVR 6.5 woods. Cardiac output 2,6 and index 1,9 per Fick.   Acute on chronic core pulmonale.  Pulmonary hypertension.   Urine output documented 1,050 ml Systolic blood pressure 121 to 126 mmHg.   Plan to  continue blood pressure monitoring.  Considering low PCWP and low cardiac output, will hold on second dose of furosemide (she did receive 40 mg this am).   Recurrent right pleural effusion Patient had thoracentesis on 04/2022, removed 700 cc. (Transudate).   Plan to repeat thoracentesis, check fluid analysis.    PAF (paroxysmal atrial fibrillation) (HCC) Rate controlled with carvedilol.  Patient is on heparin IV for anticoagulation.   COPD (chronic obstructive pulmonary disease) (HCC) No signs of exacerbation, continue oxymetry monitoring.   HLD (hyperlipidemia) Continue with atorvastatin.   CAD (coronary artery disease) No chest pain.  Continue with atorvastatin.   Macular degeneration Follow up as outpatient.   GERD without esophagitis Continue with pantoprazole.         Subjective: Patient with no chest pain, continue to have dyspnea, no lower extremity edema.   Physical Exam: Vitals:   09/06/22 1054 09/06/22 1100 09/06/22 1200 09/06/22 1300  BP: (!) 143/70 (!) 141/84 131/73 (!) 126/57  Pulse: 63 (!) 57 (!) 55 63  Resp: 19     Temp: 97.6 F (36.4 C)     TempSrc: Oral     SpO2: 97% 96% 95% 98%  Weight:      Height:       Neurology awake and alert ENT with mild pallor Cardiovascular with S1 and S2 present and regular, positive systolic murmur at the right lower sternal border, no gallops, No JVD Lower extremities with no edema and warm Respiratory with no rales or wheezing, no rhonchi Abdomen with no distention  Data Reviewed:    Family  Communication: no family at the bedside   Disposition: Status is: Inpatient Remains inpatient appropriate because: right paracentesis   Planned Discharge Destination: Home      Author: Coralie Keens, MD 09/06/2022 2:20 PM  For on call review [email or web address].

## 2022-09-06 NOTE — Assessment & Plan Note (Addendum)
Echocardiogram with preserved LV systolic function EF 50 to 55%, no LVH, RV with moderate dilatation, RVSP 50,0 mmHg. LA and RA with severe dilatation, severe tricuspid regurgitation, mild to moderate pulmonic regurgitation.   05/28 cardiac catheterization.  RV 37/4  PA 43/10 mean 21 PCWP 11 PVR 6.5 woods. Cardiac output 2,6 and index 1,9 per Fick.   Acute on chronic core pulmonale.  Pulmonary hypertension.   Patient was placed on furosemide for diuresis, negative fluid balance was achieved, she lost 1 kg during her hospitalization.   Limited pharmacologic options due to history of renal failure and hyperkalemia. She has been off SGLT 2 inh, entresto and spironolactone.  Carvedilol was held due to bradycardia, heart rate less than 60 bpm.   Plan to continue furosemide for diuresis.

## 2022-09-06 NOTE — Progress Notes (Signed)
Patient returned from cath lab at 1023 on 09/06/22 via bed.

## 2022-09-06 NOTE — Assessment & Plan Note (Signed)
No chest pain.  Continue with atorvastatin.   

## 2022-09-06 NOTE — Hospital Course (Addendum)
Mrs. Stoecklein was admitted to the hospital with the working diagnosis of heart failure exacerbation, recurrent right pleural effusion.   77 yo female with the past medical history of heart failure, pulmonary hypertension, atrial fibrillation, COPD, coronary artery disease, and macular degeneration who presented with dyspnea. Reported worsening dyspnea for several months, along with lower extremity edema that prompted her to come to the hospital. On her initial physical examination her blood pressure was 165/91, HR 65. RR 17 and 02 saturation 99%, lungs had rales bilaterally, no wheezing, heart with S1 and S2 present and rhythmic, abdomen with no distention and no lower extremity edema.   Na 136, K 3,4 CL 104 bicarbonate 22, glucose 140 bun 17 cr 1,0  Mg 2.0  BNP 1,069  Wbc 7.4 hgb 9,7 plt 281   Chest radiograph with right rotation, mild hilar vascular congestion, positive bilateral pleural effusions, more right (moderate) than left (small).   EKG 73 bpm, normal axis, normal intervals, sinus rhythm with PAC, no significant ST segment changes, negative T wave V 4 to V 6.   Patient was placed on furosemide for diuresis.  IR consulted for right thoracentesis.   05/29 cardiac catheterization with pulmonary hypertension, precapillary. 05/30 left thoracentesis 900 cc.  05/31 volume status has improved, patient will follow up as outpatient with advance heart failure.

## 2022-09-06 NOTE — Progress Notes (Signed)
ANTICOAGULATION CONSULT NOTE  Pharmacy Consult for heparin Indication: atrial fibrillation Brief A/P: aPTT supratherapeutic Decrease Heparin rate  Allergies  Allergen Reactions   Codeine Nausea And Vomiting and Other (See Comments)    Upsets the stomach    Patient Measurements: Height: 4\' 8"  (142.2 cm) Weight: 47.1 kg (103 lb 13.4 oz) IBW/kg (Calculated) : 36.3 Heparin Dosing Weight: 46kg  Vital Signs: Temp: 98 F (36.7 C) (05/29 1924) Temp Source: Oral (05/29 1924) BP: 112/63 (05/29 1924) Pulse Rate: 60 (05/29 1924)  Labs: Recent Labs    09/05/22 1251 09/06/22 0103 09/06/22 0959 09/06/22 1000 09/06/22 2124  HGB 9.7* 8.5* 9.9* 9.9*  --   HCT 32.1* 27.1* 29.0* 29.0*  --   PLT 281 238  --   --   --   APTT  --   --   --   --  140*  CREATININE 1.00 1.16*  --   --   --      Estimated Creatinine Clearance: 26.4 mL/min (A) (by C-G formula based on SCr of 1.16 mg/dL (H)).  Assessment: 77 y.o. female with h/o Afib, Eliquis on hold, for heparin  Goal of Therapy:  Heparin level 0.3-0.7 units/ml Monitor platelets by anticoagulation protocol: Yes   Plan:  Decrease Heparin 550 units/hr Check aPTT in 8 hours   Geannie Risen, PharmD, BCPS

## 2022-09-06 NOTE — Assessment & Plan Note (Signed)
Continue with pantoprazole/.  

## 2022-09-06 NOTE — Progress Notes (Signed)
Patient transported to cath lab at 0817 on 09/05/22 in bed.

## 2022-09-06 NOTE — Interval H&P Note (Signed)
History and Physical Interval Note:  09/06/2022 8:42 AM  Laurie James  has presented today for surgery, with the diagnosis of heart failure.  The various methods of treatment have been discussed with the patient and family. After consideration of risks, benefits and other options for treatment, the patient has consented to  Procedure(s): RIGHT HEART CATH (N/A) as a surgical intervention.  The patient's history has been reviewed, patient examined, no change in status, stable for surgery.  I have reviewed the patient's chart and labs.  Questions were answered to the patient's satisfaction.     [name]

## 2022-09-07 ENCOUNTER — Inpatient Hospital Stay (HOSPITAL_COMMUNITY): Payer: Medicare PPO

## 2022-09-07 DIAGNOSIS — J90 Pleural effusion, not elsewhere classified: Secondary | ICD-10-CM | POA: Diagnosis not present

## 2022-09-07 DIAGNOSIS — I272 Pulmonary hypertension, unspecified: Secondary | ICD-10-CM | POA: Diagnosis not present

## 2022-09-07 DIAGNOSIS — J410 Simple chronic bronchitis: Secondary | ICD-10-CM | POA: Diagnosis not present

## 2022-09-07 DIAGNOSIS — I5033 Acute on chronic diastolic (congestive) heart failure: Secondary | ICD-10-CM | POA: Diagnosis not present

## 2022-09-07 DIAGNOSIS — I480 Paroxysmal atrial fibrillation: Secondary | ICD-10-CM | POA: Diagnosis not present

## 2022-09-07 HISTORY — PX: IR THORACENTESIS ASP PLEURAL SPACE W/IMG GUIDE: IMG5380

## 2022-09-07 LAB — BASIC METABOLIC PANEL
Anion gap: 7 (ref 5–15)
BUN: 25 mg/dL — ABNORMAL HIGH (ref 8–23)
CO2: 26 mmol/L (ref 22–32)
Calcium: 8.5 mg/dL — ABNORMAL LOW (ref 8.9–10.3)
Chloride: 102 mmol/L (ref 98–111)
Creatinine, Ser: 1.35 mg/dL — ABNORMAL HIGH (ref 0.44–1.00)
GFR, Estimated: 41 mL/min — ABNORMAL LOW (ref >60)
Glucose, Bld: 119 mg/dL — ABNORMAL HIGH (ref 70–99)
Potassium: 3.8 mmol/L (ref 3.5–5.1)
Sodium: 135 mmol/L (ref 135–145)

## 2022-09-07 LAB — BODY FLUID CELL COUNT WITH DIFFERENTIAL
Lymphs, Fluid: 51 %
Monocyte-Macrophage-Serous Fluid: 37 % — ABNORMAL LOW (ref 50–90)

## 2022-09-07 LAB — CBC
HCT: 29.2 % — ABNORMAL LOW (ref 36.0–46.0)
Hemoglobin: 9.1 g/dL — ABNORMAL LOW (ref 12.0–15.0)
MCH: 28.3 pg (ref 26.0–34.0)
MCHC: 31.2 g/dL (ref 30.0–36.0)
MCV: 90.7 fL (ref 80.0–100.0)
Platelets: 257 10*3/uL (ref 150–400)
RBC: 3.22 MIL/uL — ABNORMAL LOW (ref 3.87–5.11)
RDW: 18.3 % — ABNORMAL HIGH (ref 11.5–15.5)
WBC: 7.3 10*3/uL (ref 4.0–10.5)
nRBC: 0 % (ref 0.0–0.2)

## 2022-09-07 LAB — GLUCOSE, PLEURAL OR PERITONEAL FLUID

## 2022-09-07 LAB — GRAM STAIN

## 2022-09-07 LAB — LACTATE DEHYDROGENASE, PLEURAL OR PERITONEAL FLUID: LD, Fluid: 80 U/L — ABNORMAL HIGH (ref 3–23)

## 2022-09-07 LAB — PROTEIN, PLEURAL OR PERITONEAL FLUID: Total protein, fluid: 3.9 g/dL

## 2022-09-07 LAB — LACTATE DEHYDROGENASE: LDH: 160 U/L (ref 98–192)

## 2022-09-07 MED ORDER — APIXABAN 2.5 MG PO TABS
2.5000 mg | ORAL_TABLET | Freq: Two times a day (BID) | ORAL | Status: DC
  Administered 2022-09-07 – 2022-09-08 (×3): 2.5 mg via ORAL
  Filled 2022-09-07 (×3): qty 1

## 2022-09-07 MED ORDER — AMIODARONE HCL 200 MG PO TABS
200.0000 mg | ORAL_TABLET | Freq: Every day | ORAL | Status: DC
  Administered 2022-09-07 – 2022-09-08 (×2): 200 mg via ORAL
  Filled 2022-09-07 (×2): qty 1

## 2022-09-07 MED ORDER — LIDOCAINE HCL 1 % IJ SOLN
INTRAMUSCULAR | Status: AC
  Filled 2022-09-07: qty 20

## 2022-09-07 NOTE — Plan of Care (Signed)
?  Problem: Education: ?Goal: Ability to demonstrate management of disease process will improve ?Outcome: Progressing ?Goal: Individualized Educational Video(s) ?Outcome: Progressing ?  ?

## 2022-09-07 NOTE — Progress Notes (Signed)
ANTICOAGULATION CONSULT NOTE - Initial Consult  Pharmacy Consult for heparin Indication: atrial fibrillation  Allergies  Allergen Reactions   Codeine Nausea And Vomiting and Other (See Comments)    Upsets the stomach    Patient Measurements: Height: 4\' 8"  (142.2 cm) Weight: 47.8 kg (105 lb 6.4 oz) IBW/kg (Calculated) : 36.3 Heparin Dosing Weight: 46kg  Vital Signs: Temp: 98.4 F (36.9 C) (05/30 0751) Temp Source: Oral (05/30 0751) BP: 117/70 (05/30 0751) Pulse Rate: 59 (05/30 0751)  Labs: Recent Labs    09/05/22 1251 09/06/22 0103 09/06/22 0959 09/06/22 1000 09/06/22 2124  HGB 9.7* 8.5* 9.9* 9.9*  --   HCT 32.1* 27.1* 29.0* 29.0*  --   PLT 281 238  --   --   --   APTT  --   --   --   --  140*  CREATININE 1.00 1.16*  --   --   --      Estimated Creatinine Clearance: 26.6 mL/min (A) (by C-G formula based on SCr of 1.16 mg/dL (H)).   Medical History: Past Medical History:  Diagnosis Date   AMD (age related macular degeneration)    COPD (chronic obstructive pulmonary disease) (HCC)    Depression    Hiatal hernia    Intermittent low back pain    Memory impairment    Midsternal chest pain    a. 12/2011 Cardiac CTA Ca++ score of 103.3 (80th %), LAD <50p/m, RCA 50-75.   Osteoporosis    PAF (paroxysmal atrial fibrillation) (HCC) 09/2020   Paroxysmal atrial fibrillation with RVR (HCC) 11/28/2021   Vitamin B 12 deficiency       Assessment: 77 yo W with afib on apixaban PTA, last dose 5/28 9am. Patient had RHC 5/29 with sheath removed at 10am. Pharmacy consulted for heparin to begin 4hr after cath and to stop 1hr after thoracentesis planned 5/30.    Heparin was stopped at 7am for planned thoracentesis around 8-8:30. Will hold on repeat aPTT and follow up plan after procedure.   Goal of Therapy:  Heparin level 0.3-0.7 units/ml Monitor platelets by anticoagulation protocol: Yes   Plan:  F/u restart heparin vs apixaban    Alphia Moh, PharmD, BCPS,  [facility] Clinical Pharmacist  Please check AMION for all [facility] Pharmacy phone numbers After 10:00 PM, call Main Pharmacy [phone]

## 2022-09-07 NOTE — Progress Notes (Signed)
RN relayed to patient and patient son that MD ordered a PT/OT eval. RN asked patient if they would like to have dinner in the chair. Patient relayed "Not now, maybe later, I would like nausea medication". IV zofran adminstered.

## 2022-09-07 NOTE — Progress Notes (Signed)
Order to stopped heparin gtt one hour prior to thoracentesis. Heparin gtt stopped at 0700 per night shift RN per MD order. Procedure is at [phone].

## 2022-09-07 NOTE — Progress Notes (Addendum)
Progress Note   Patient: Laurie James AVW:[id number] DOB: 06-Mar-1946 DOA: 09/05/2022     1 DOS: the patient was seen and examined on 09/07/2022   Brief hospital course: Laurie James was admitted to the hospital with the working diagnosis of heart failure exacerbation, recurrent right pleural effusion.   78 yo female with the past medical history of heart failure, pulmonary hypertension, atrial fibrillation, COPD, coronary artery disease, and cognitive impairment who presented with dyspnea. Reported worsening dyspnea for several months, along with lower extremity edema that prompted to come to the hospital. On her initial physical examination her blood pressure was 165/91, HR 65. RR 17 and 02 saturation 99%, lungs had rales bilaterally, no wheezing, heart with S1 and S2 present and rhythmic, abdomen with no distention and no lower extremity edema.   Na 136, K 3,4 CL 104 bicarbonate 22, glucose 140 bun 17 cr 1,0  Mg 2.0  BNP 1,069  Wbc 7.4 hgb 9,7 plt 281   Chest radiograph with right rotation, mild hilar vascular congestion, positive bilateral pleural effusions, more right (moderate) than left (small).   EKG 73 bpm, normal axis, normal intervals, sinus rhythm with PAC, no significant ST segment changes, negative T wave V 4 to V 6.   Patient was placed on furosemide for diuresis.  IR consulted for right thoracentesis.   05/29 cardiac catheterization with pulmonary hypertension, precapillary. 05/30 left thoracentesis 900 cc.   Assessment and Plan: * Acute on chronic diastolic CHF (congestive heart failure) (HCC) Echocardiogram with preserved LV systolic function EF 50 to 55%, no LVH, RV with moderate dilatation, RVSP 50,0 mmHg. LA and RA with severe dilatation, severe tricuspid regurgitation, mild to moderate pulmonic regurgitation.   05/28 cardiac catheterization.  RV 37/4  PA 43/10 mean 21 PCWP 11 PVR 6.5 woods. Cardiac output 2,6 and index 1,9 per Fick.   Acute on  chronic core pulmonale.  Pulmonary hypertension.   Urine output documented 1,025 ml Systolic blood pressure 112 to 129 mmHg.   Plan to continue blood pressure monitoring.  Hold on furosemide for now.   Recurrent right pleural effusion Patient had thoracentesis on 04/2022, removed 700 cc. (Transudate).   05/30 right thoracentesis 900 cc fluid removed, transudate per light's criteria.  Plan to keep negative fluid balance.  Patient with pulmonary hypertension, possible class 1.   PAF (paroxysmal atrial fibrillation) (HCC) Telemetry with atrial fibrillation with rate of 50 bpm.  Plan to resume anticoagulation with apixaban. Patient on carvedilol (place holding parameters) and amiodarone.   COPD (chronic obstructive pulmonary disease) (HCC) No signs of exacerbation, continue oxymetry monitoring.   HLD (hyperlipidemia) Continue with atorvastatin.   CAD (coronary artery disease) No chest pain.  Continue with atorvastatin.   Macular degeneration Follow up as outpatient.   GERD without esophagitis Continue with pantoprazole.       Subjective: Patient sp thoracentesis with improvement in her dyspnea, positive chest pain at the site of thoracentesis, no edema or PND.   Physical Exam: Vitals:   09/07/22 0500 09/07/22 0751 09/07/22 0950 09/07/22 1223  BP:  117/70 129/70 112/66  Pulse:  (!) 59 60 60  Resp:  18 16 18   Temp:  98.4 F (36.9 C)  98.2 F (36.8 C)  TempSrc:  Oral  Oral  SpO2:  97%  99%  Weight: 47.8 kg     Height:       Neurology awake and alert ENT with mild pallor Cardiovascular with S1 and S2 present, irregularly irregular with positive  systolic murmur at the right lower sternal border  No JVD No lower extremity edema Respiratory with no rales or wheezing, no rhonchi.  Abdomen with no distention  Data Reviewed:    Family Communication: no family at the bedside   Disposition: Status is: Inpatient Remains inpatient appropriate because: recurrent  effusion. Possible dc home tomorrow   Planned Discharge Destination: Home    Author: Coralie Keens, MD 09/07/2022 1:54 PM  For on call review [email or web address].

## 2022-09-07 NOTE — Procedures (Signed)
PROCEDURE SUMMARY:  Successful US guided right thoracentesis. Yielded 900 ml of dark yellow fluid. Pt tolerated procedure well. No immediate complications.  Specimen sent for labs. CXR ordered; pending  EBL < 2 mL  Mickie Kay, NP 09/07/2022 10:07 AM

## 2022-09-07 NOTE — Progress Notes (Signed)
Patient arrived back from IR post thoracentesis. 0/10 pain. Patient HR is trending 54-59. Held 0800 coreg. Notified cards. Spoke to [place] from cards, Verbal order to continue to hold coreg.

## 2022-09-07 NOTE — TOC Initial Note (Signed)
Transition of Care [facility]) - Initial/Assessment Note    Patient Details  Name: Laurie James MRN: [id number] Date of Birth: 1945/11/12  Transition of Care [facility]) CM/SW Contact:    Leone Haven, RN Phone Number: 09/07/2022, 3:29 PM  Clinical Narrative:                 From home with spouse, she is legally blind, she has a w/chair, walker, and a cane but do not use them.  She has PCP on file and insurance.  Her son Fraser Din or daughter Tresa Endo will transport home at [employer].  She gets meds from [place] in [place], her support person is her spouse and son and daughter. She is s/p Thoracentesis today, had a cath yesterday, she Is on IV lasix.  She does not have any HH services at this time.    Expected Discharge Plan: Home w Home Health Services Barriers to Discharge: Continued Medical Work up   Patient Goals and CMS Choice Patient states their goals for this hospitalization and ongoing recovery are:: return home   Choice offered to / list presented to : NA      Expected Discharge Plan and Services In-house Referral: NA Discharge Planning Services: CM Consult Post Acute Care Choice: NA Living arrangements for the past 2 months: Single Family Home                 DME Arranged: N/A DME Agency: NA       HH Arranged: NA          Prior Living Arrangements/Services Living arrangements for the past 2 months: Single Family Home Lives with:: Spouse Patient language and need for interpreter reviewed:: Yes Do you feel safe going back to the place where you live?: Yes      Need for Family Participation in Patient Care: Yes (Comment) Care giver support system in place?: Yes (comment) Current home services: DME (walker, cane, w/chair) Criminal Activity/Legal Involvement Pertinent to Current Situation/Hospitalization: No - Comment as needed  Activities of Daily Living Home Assistive Devices/Equipment: Grab bars in shower, Shower chair with back ADL Screening  (condition at time of admission) Patient's cognitive ability adequate to safely complete daily activities?: No Is the patient deaf or have difficulty hearing?: Yes Does the patient have difficulty seeing, even when wearing glasses/contacts?: Yes Does the patient have difficulty concentrating, remembering, or making decisions?: No Patient able to express need for assistance with ADLs?: No Does the patient have difficulty dressing or bathing?: Yes Independently performs ADLs?: No Communication: Independent Dressing (OT): Needs assistance Is this a change from baseline?: Pre-admission baseline Grooming: Needs assistance Is this a change from baseline?: Pre-admission baseline Feeding: Needs assistance Is this a change from baseline?: Pre-admission baseline Bathing: Needs assistance Is this a change from baseline?: Pre-admission baseline Toileting: Needs assistance Is this a change from baseline?: Pre-admission baseline In/Out Bed: Needs assistance Is this a change from baseline?: Pre-admission baseline Walks in Home: Needs assistance Is this a change from baseline?: Pre-admission baseline Does the patient have difficulty walking or climbing stairs?: Yes Weakness of Legs: None Weakness of Arms/Hands: None  Permission Sought/Granted      Share Information with NAME: Fraser Din and Tresa Endo  Permission granted to share info w AGENCY: yes  Permission granted to share info w Relationship: son and daughter     Emotional Assessment Appearance:: Appears stated age Attitude/Demeanor/Rapport: Engaged Affect (typically observed): Appropriate Orientation: : Oriented to Self, Oriented to Place, Oriented to  Time, Oriented to  Situation Alcohol / Substance Use: Not Applicable Psych Involvement: No (comment)  Admission diagnosis:  Acute on chronic systolic (congestive) heart failure (HCC) [I50.23] Heart failure (HCC) [I50.9] Patient Active Problem List   Diagnosis Date Noted   Heart failure  (HCC) 09/06/2022   Acute on chronic diastolic CHF (congestive heart failure) (HCC) 09/05/2022   GERD without esophagitis 05/02/2022   Gout 05/02/2022   Left lower quadrant abdominal pain 05/02/2022   Elevated LFTs 05/02/2022   Hyponatremia 05/02/2022   Recurrent right pleural effusion 05/01/2022   PAF (paroxysmal atrial fibrillation) (HCC) 05/01/2022   Secondary hypercoagulable state (HCC) 04/04/2022   Chronic systolic CHF (congestive heart failure) (HCC) 11/28/2021   Macular degeneration 11/28/2021   MCI (mild cognitive impairment) 11/28/2021   OSA (obstructive sleep apnea)    Protein-calorie malnutrition, severe 10/22/2020   Depression    Bilateral pleural effusion 10/19/2020   Pulmonary HTN (HCC) 10/11/2020   Tricuspid regurgitation 10/11/2020   HLD (hyperlipidemia) 10/11/2020   Hypotension 10/11/2020   Hypokalemia 10/11/2020   Chest pain at rest 10/05/2020   CAD (coronary artery disease) 04/24/2014   COPD (chronic obstructive pulmonary disease) (HCC) 04/24/2014   PCP:  Georgann Housekeeper, MD Pharmacy:   [facility] [address] [place] - [address] [facility] [place] [address] Phone: [phone] Fax: [phone]     Social Determinants of Health (SDOH) Social History: SDOH Screenings   Food Insecurity: No Food Insecurity (09/05/2022)  Housing: Patient Unable To Answer (09/05/2022)  Transportation Needs: No Transportation Needs (09/05/2022)  Utilities: Not At Risk (09/05/2022)  Tobacco Use: Medium Risk (09/07/2022)   SDOH Interventions:     Readmission Risk Interventions    09/07/2022    3:25 PM  Readmission Risk Prevention Plan  Transportation Screening Complete  PCP or Specialist Appt within 5-7 Days Complete  Home Care Screening Complete  Medication Review (RN CM) Complete

## 2022-09-07 NOTE — Progress Notes (Signed)
Cardiology:  Bensimohn/Rahaf Carbonell  Subjective:  Dyspnea better For thoracentesis this am   Objective:  Vitals:   09/07/22 0056 09/07/22 0355 09/07/22 0500 09/07/22 0751  BP: 112/67 120/75  117/70  Pulse:  (!) 54  (!) 59  Resp: 20 18  18   Temp: 98.1 F (36.7 C) 98 F (36.7 C)  98.4 F (36.9 C)  TempSrc: Oral Oral  Oral  SpO2:  96%  97%  Weight: 47.8 kg  47.8 kg   Height:        Intake/Output from previous day:  Intake/Output Summary (Last 24 hours) at 09/07/2022 0850 Last data filed at 09/07/2022 0800 Gross per 24 hour  Intake 1188.8 ml  Output 1025 ml  Net 163.8 ml    Physical Exam:  Rosacea poor vision Decrease BS mid/base right JVP elevated no murmur Abdomen benign Plus one edema with excoriations on legs Palpable pedal pulses   Lab Results: Basic Metabolic Panel: Recent Labs    09/05/22 1251 09/06/22 0103 09/06/22 0959 09/06/22 1000  NA 136 139 142 142  K 3.4* 4.0 4.6 4.6  CL 104 107  --   --   CO2 22 24  --   --   GLUCOSE 140* 89  --   --   BUN 17 18  --   --   CREATININE 1.00 1.16*  --   --   CALCIUM 8.6* 8.5*  --   --   MG 2.0  --   --   --    Liver Function Tests: Recent Labs    09/06/22 0103  AST 22  ALT 13  ALKPHOS 76  BILITOT 1.3*  PROT 6.9  ALBUMIN 2.7*   No results for input(s): "LIPASE", "AMYLASE" in the last 72 hours. CBC: Recent Labs    09/05/22 1251 09/06/22 0103 09/06/22 0959 09/06/22 1000  WBC 7.4 6.4  --   --   HGB 9.7* 8.5* 9.9* 9.9*  HCT 32.1* 27.1* 29.0* 29.0*  MCV 90.2 88.3  --   --   PLT 281 238  --   --      Imaging: CARDIAC CATHETERIZATION  Result Date: 09/06/2022 HEMODYNAMICS: RA:   4 mmHg (mean) RV:   37/2-4 mmHg PA:   43/10 mmHg (21 mean) L-PCWP:  11 mmHg (mean) R-PCWP: 3-5    Estimated Fick CO/CI   2.6 L/min, 1.9 L/min/m2    TPG    17  mmHg  (Based on R PCWP)   PVR     6.5 Wood Units PAPi      8  IMPRESSION: Low pre and post capillary filling pressures ~13mmHg difference in R-PCWP & L-PCWP Severely  elevated PVR Severely reduced cardiac output / cardiac index by Serina Cowper Sabharwal 10:19 AM  DG Chest 2 View  Result Date: 09/05/2022 CLINICAL DATA:  Shortness of breath EXAM: CHEST - 2 VIEW COMPARISON:  Chest x-ray dated May 02, 2018 FINDINGS: Cardiac and mediastinal contours are unchanged. Unchanged biapical pleural-parenchymal scarring. New moderate right pleural effusion. Small left pleural effusion, slightly increased in size when compared with the prior exam. No evidence of pneumothorax. Levocurvature of the thoracic spine. IMPRESSION: 1. New moderate right pleural effusion. 2. Small left pleural effusion, slightly increased in size when compared with the prior exam. Electronically Signed   By: Allegra Lai M.D.   On: 09/05/2022 13:25    Cardiac Studies:  ECG: NSR/PAC;s no afib   Telemetry:NSR  Echo: EF 50-55%   Medications:    atorvastatin  40 mg Oral Daily   carvedilol  3.125 mg Oral BID WC   pantoprazole  40 mg Oral BID   sodium chloride flush  3 mL Intravenous Q12H        Assessment/Plan:   CHF:  disproportionate to LVEF reduction. She has had recurrent right pleural effusoin post ablation in November continue laisx IR thoracentesis this am Prior tap with transudate in January She has severe TR and moderate pulmonary HTN likely from COPD Not clear that she would be a Tri Clip candidate  Right heart cath yesterday with elevated left sided PCWP compared to right  PVR elevatied with low CO. The left lower PV was small to start with but would likely need 2 or more stenotic veins to cause issues GDMT per CHF pending results of right heart cath Currently on coreg and laisx Dr Teressa Lower has stopped Jardiacne, aldactone and entresto in past for repeated renal failure and hyperkalemia  PAF:  maintaining NSR post ablation Has severe bi atrial enlargement Resume eliquis post thoracentesis and right heart cath   Patient wishes to go home tomorrow will arrange PV CTA as outpatient  Possible d/c pending thoracentesis and CXR in am   Charlton Haws 09/07/2022, 8:50 AM

## 2022-09-08 ENCOUNTER — Ambulatory Visit: Payer: Medicare PPO | Attending: Cardiovascular Disease | Admitting: Cardiovascular Disease

## 2022-09-08 ENCOUNTER — Other Ambulatory Visit (HOSPITAL_COMMUNITY): Payer: Self-pay

## 2022-09-08 DIAGNOSIS — I480 Paroxysmal atrial fibrillation: Secondary | ICD-10-CM | POA: Diagnosis not present

## 2022-09-08 DIAGNOSIS — N1831 Chronic kidney disease, stage 3a: Secondary | ICD-10-CM

## 2022-09-08 DIAGNOSIS — N1832 Chronic kidney disease, stage 3b: Secondary | ICD-10-CM

## 2022-09-08 DIAGNOSIS — J90 Pleural effusion, not elsewhere classified: Secondary | ICD-10-CM | POA: Diagnosis not present

## 2022-09-08 DIAGNOSIS — I5033 Acute on chronic diastolic (congestive) heart failure: Secondary | ICD-10-CM | POA: Diagnosis not present

## 2022-09-08 LAB — BASIC METABOLIC PANEL
Anion gap: 7 (ref 5–15)
BUN: 24 mg/dL — ABNORMAL HIGH (ref 8–23)
CO2: 26 mmol/L (ref 22–32)
Calcium: 8.4 mg/dL — ABNORMAL LOW (ref 8.9–10.3)
Chloride: 101 mmol/L (ref 98–111)
Creatinine, Ser: 1.38 mg/dL — ABNORMAL HIGH (ref 0.44–1.00)
GFR, Estimated: 40 mL/min — ABNORMAL LOW (ref >60)
Glucose, Bld: 101 mg/dL — ABNORMAL HIGH (ref 70–99)
Potassium: 3.8 mmol/L (ref 3.5–5.1)
Sodium: 134 mmol/L — ABNORMAL LOW (ref 135–145)

## 2022-09-08 LAB — CULTURE, BODY FLUID W GRAM STAIN -BOTTLE

## 2022-09-08 LAB — PATHOLOGIST SMEAR REVIEW: Path Review: REACTIVE

## 2022-09-08 MED ORDER — FUROSEMIDE 20 MG PO TABS
20.0000 mg | ORAL_TABLET | Freq: Every day | ORAL | Status: DC
  Administered 2022-09-08: 20 mg via ORAL
  Filled 2022-09-08: qty 1

## 2022-09-08 MED ORDER — AMIODARONE HCL 200 MG PO TABS
200.0000 mg | ORAL_TABLET | Freq: Every day | ORAL | 0 refills | Status: DC
  Filled 2022-09-08: qty 30, 30d supply, fill #0

## 2022-09-08 MED ORDER — CARVEDILOL 3.125 MG PO TABS
3.1250 mg | ORAL_TABLET | Freq: Two times a day (BID) | ORAL | 0 refills | Status: DC
  Filled 2022-09-08: qty 30, 15d supply, fill #0

## 2022-09-08 NOTE — Progress Notes (Signed)
Cardiology:  Laurie James/Laurie James  Subjective:  Better post thoracentesis   Objective:  Vitals:   09/07/22 1937 09/08/22 0001 09/08/22 0553 09/08/22 0733  BP: 131/78 (!) 142/76 127/76 136/72  Pulse: 62 (!) 109 60 (!) 59  Resp: 17 17 17 17   Temp: 97.8 F (36.6 C) 97.8 F (36.6 C) 97.9 F (36.6 C) 97.9 F (36.6 C)  TempSrc: Oral Oral Oral Oral  SpO2: 100% 98% 99% 99%  Weight:   47.4 kg   Height:        Intake/Output from previous day:  Intake/Output Summary (Last 24 hours) at 09/08/2022 0957 Last data filed at 09/08/2022 0804 Gross per 24 hour  Intake 476 ml  Output --  Net 476 ml    Physical Exam:  Rosacea poor vision Decrease improved aeration right base  JVP elevated no murmur Abdomen benign Plus one edema with excoriations on legs Palpable pedal pulses   Lab Results: Basic Metabolic Panel: Recent Labs    09/05/22 1251 09/06/22 0103 09/07/22 0812 09/08/22 0026  NA 136   < > 135 134*  K 3.4*   < > 3.8 3.8  CL 104   < > 102 101  CO2 22   < > 26 26  GLUCOSE 140*   < > 119* 101*  BUN 17   < > 25* 24*  CREATININE 1.00   < > 1.35* 1.38*  CALCIUM 8.6*   < > 8.5* 8.4*  MG 2.0  --   --   --    < > = values in this interval not displayed.   Liver Function Tests: Recent Labs    09/06/22 0103  AST 22  ALT 13  ALKPHOS 76  BILITOT 1.3*  PROT 6.9  ALBUMIN 2.7*   No results for input(s): "LIPASE", "AMYLASE" in the last 72 hours. CBC: Recent Labs    09/06/22 0103 09/06/22 0959 09/06/22 1000 09/07/22 0812  WBC 6.4  --   --  7.3  HGB 8.5*   < > 9.9* 9.1*  HCT 27.1*   < > 29.0* 29.2*  MCV 88.3  --   --  90.7  PLT 238  --   --  257   < > = values in this interval not displayed.     Imaging: DG Chest 1 View  Result Date: 09/07/2022 CLINICAL DATA:  Status post thoracentesis EXAM: PORTABLE CHEST 1 VIEW COMPARISON:  09/05/2022 FINDINGS: Previously seen right pleural effusion as reduced significantly consistent with the recent thoracentesis. No  pneumothorax is noted. Small residual effusions are noted bilaterally. No focal confluent infiltrate is seen. No acute bony abnormality is noted. IMPRESSION: No pneumothorax following right thoracentesis. Electronically Signed   By: Alcide Clever M.D.   On: 09/07/2022 15:44   IR THORACENTESIS ASP PLEURAL SPACE W/IMG GUIDE  Result Date: 09/07/2022 INDICATION: Patient with a history of heart failure with recurrent pleural effusions. Interventional radiology asked to perform a diagnostic and therapeutic thoracentesis. EXAM: ULTRASOUND GUIDED THORACENTESIS MEDICATIONS: 1% lidocaine 10 mL COMPLICATIONS: None immediate. PROCEDURE: An ultrasound guided thoracentesis was thoroughly discussed with the patient and questions answered. The benefits, risks, alternatives and complications were also discussed. The patient understands and wishes to proceed with the procedure. Written consent was obtained. Ultrasound was performed to localize and mark an adequate pocket of fluid in the right chest. The area was then prepped and draped in the normal sterile fashion. 1% Lidocaine was used for local anesthesia. Under ultrasound guidance a 6 Fr Safe-T-Centesis catheter was  introduced. Thoracentesis was performed. The catheter was removed and a dressing applied. FINDINGS: A total of approximately 900 mL of dark yellow fluid was removed. Samples were sent to the laboratory as requested by the clinical team. IMPRESSION: Successful ultrasound guided right thoracentesis yielding 900 mL of pleural fluid. Procedure performed by Alwyn Ren NP Electronically Signed   By: Richarda Overlie M.D.   On: 09/07/2022 13:32    Cardiac Studies:  ECG: NSR/PAC;s no afib   Telemetry:NSR  Echo: EF 50-55%   Medications:    amiodarone  200 mg Oral Daily   apixaban  2.5 mg Oral BID   atorvastatin  40 mg Oral Daily   carvedilol  3.125 mg Oral BID WC   pantoprazole  40 mg Oral BID   sodium chloride flush  3 mL Intravenous Q12H         Assessment/Plan:   CHF:  disproportionate to LVEF reduction. She has had recurrent right pleural effusoin post ablation in November continue laisx IR thoracentesis this am Prior tap with transudate in January She has severe TR and moderate pulmonary HTN likely from COPD Not clear that she would be a Tri Clip candidate  Right heart cath yesterday with elevated left sided PCWP compared to right  PVR elevatied with low CO. The left lower PV was small to start with but would likely need 2 or more stenotic veins to cause issues GDMT per CHF pending results of right heart cath Currently on coreg and laisx Dr Teressa Lower has stopped Jardiacne, aldactone and entresto in past for repeated renal failure and hyperkalemia  PAF:  maintaining NSR post ablation Has severe bi atrial enlargement Resume eliquis post thoracentesis and right heart cath   She can be d/c today will arrange outpatient PV CTA and f/u with Dr Nelly Laurence and Laurie James BMET and BNP in 2 weeks  Laurie James 09/08/2022, 9:57 AM

## 2022-09-08 NOTE — Discharge Summary (Signed)
Physician Discharge Summary   Patient: Laurie James MRN: [id number] DOB: 06-Apr-1946  Admit date:     09/05/2022  Discharge date: 09/08/22  Discharge Physician: Coralie Keens   PCP: Georgann Housekeeper, MD   Recommendations at discharge:    Patient will continue diuresis with furosemide.  Patient has been placed on carvedilol for heart failure but developed bradycardia and had to be stopped. She has limited pharmacologic options due to renal failure and risk of hyperkalemia. Patient has been resumed on amiodarone 200 mg daily until follow up with Dr Nelly Laurence, then probably plan to change to 100 mg daily.  Follow up as outpatient with Dr Donette Larry in 7 to 10 days Follow up with Cardiology as scheduled.   Discharge Diagnoses: Principal Problem:   Acute on chronic diastolic CHF (congestive heart failure) (HCC) Active Problems:   Recurrent right pleural effusion   PAF (paroxysmal atrial fibrillation) (HCC)   COPD (chronic obstructive pulmonary disease) (HCC)   HLD (hyperlipidemia)   CAD (coronary artery disease)   Macular degeneration   GERD without esophagitis  Resolved Problems:   * No resolved hospital problems. [facility]: Laurie James was admitted to the hospital with the working diagnosis of heart failure exacerbation, recurrent right pleural effusion.   77 yo female with the past medical history of heart failure, pulmonary hypertension, atrial fibrillation, COPD, coronary artery disease, and macular degeneration who presented with dyspnea. Reported worsening dyspnea for several months, along with lower extremity edema that prompted her to come to the hospital. On her initial physical examination her blood pressure was 165/91, HR 65. RR 17 and 02 saturation 99%, lungs had rales bilaterally, no wheezing, heart with S1 and S2 present and rhythmic, abdomen with no distention and no lower extremity edema.   Na 136, K 3,4 CL 104 bicarbonate 22, glucose 140 bun 17  cr 1,0  Mg 2.0  BNP 1,069  Wbc 7.4 hgb 9,7 plt 281   Chest radiograph with right rotation, mild hilar vascular congestion, positive bilateral pleural effusions, more right (moderate) than left (small).   EKG 73 bpm, normal axis, normal intervals, sinus rhythm with PAC, no significant ST segment changes, negative T wave V 4 to V 6.   Patient was placed on furosemide for diuresis.  IR consulted for right thoracentesis.   05/29 cardiac catheterization with pulmonary hypertension, precapillary. 05/30 left thoracentesis 900 cc.  05/31 volume status has improved, patient will follow up as outpatient with advance heart failure.   Assessment and Plan: * Acute on chronic diastolic CHF (congestive heart failure) (HCC) Echocardiogram with preserved LV systolic function EF 50 to 55%, no LVH, RV with moderate dilatation, RVSP 50,0 mmHg. LA and RA with severe dilatation, severe tricuspid regurgitation, mild to moderate pulmonic regurgitation.   05/28 cardiac catheterization.  RV 37/4  PA 43/10 mean 21 PCWP 11 PVR 6.5 woods. Cardiac output 2,6 and index 1,9 per Fick.   Acute on chronic core pulmonale.  Pulmonary hypertension.   Patient was placed on furosemide for diuresis, negative fluid balance was achieved, she lost 1 kg during her hospitalization.   Limited pharmacologic options due to history of renal failure and hyperkalemia. She has been off SGLT 2 inh, entresto and spironolactone.  Carvedilol was held due to bradycardia, heart rate less than 60 bpm.   Plan to continue furosemide for diuresis.   Recurrent right pleural effusion Patient had thoracentesis on 04/2022, removed 700 cc. (Transudate).   05/30 right thoracentesis 900 cc fluid  removed, transudate per light's criteria.  Plan to keep negative fluid balance.  Patient with pulmonary hypertension, possible class 1 or class 3.   PAF (paroxysmal atrial fibrillation) (HCC) Amiodarone has been resumed for rate  control. Anticoagulation with apixaban.  Patient had ablation in November 2023   Plan to continue with 200 mg of amiodarone daily until follow up with Dr Nelly Laurence, then possible change to 100 mg daily.   CKD stage 3b, GFR 30-44 ml/min (HCC) Hyponatremia.   Renal function stable, at the time of her discharge serum cr at 1,38, with K at 3,8 and serum bicarbonate at 26. Na 134.  Plan to follow up renal function as outpatient.   COPD (chronic obstructive pulmonary disease) (HCC) No signs of exacerbation, continue oxymetry monitoring.   HLD (hyperlipidemia) Continue with atorvastatin.   CAD (coronary artery disease) No chest pain.  Continue with atorvastatin.   Macular degeneration Follow up as outpatient.   GERD without esophagitis Continue with pantoprazole.         Consultants: cardiology, IR  Procedures performed: right thoracentesis   Disposition: Home Diet recommendation:  Cardiac diet DISCHARGE MEDICATION: Allergies as of 09/08/2022       Reactions   Codeine Nausea And Vomiting, Other (See Comments)   Upsets the stomach        Medication List     TAKE these medications    albuterol 108 (90 Base) MCG/ACT inhaler Commonly known as: VENTOLIN HFA Inhale 2 puffs into the lungs every 4 (four) hours as needed for wheezing.   amiodarone 200 MG tablet Commonly known as: PACERONE Take 1 tablet (200 mg total) by mouth daily. Start taking on: September 09, 2022   apixaban 2.5 MG Tabs tablet Commonly known as: ELIQUIS Take 1 tablet (2.5 mg total) by mouth 2 (two) times daily.   atorvastatin 40 MG tablet Commonly known as: LIPITOR Take 1 tablet (40 mg total) by mouth daily.   carvedilol 3.125 MG tablet Commonly known as: COREG Take 1 tablet (3.125 mg total) by mouth 2 (two) times daily with a meal.   cyanocobalamin 1000 MCG/ML injection Commonly known as: VITAMIN B12 Inject 1,000 mcg into the muscle every 30 (thirty) days.   furosemide 20 MG tablet Commonly  known as: LASIX Take 1 tablet (20 mg total) by mouth as needed. Leg edema   nitroGLYCERIN 0.4 MG SL tablet Commonly known as: NITROSTAT Place 1 tablet (0.4 mg total) under the tongue every 5 (five) minutes x 3 doses as needed for chest pain.   ondansetron 4 MG tablet Commonly known as: ZOFRAN Take 4 mg by mouth 3 (three) times daily as needed for nausea or vomiting.   pantoprazole 40 MG tablet Commonly known as: PROTONIX Take 1 tablet (40 mg total) by mouth 2 (two) times daily.   polyethylene glycol 17 g packet Commonly known as: MIRALAX / GLYCOLAX Take 17 g by mouth daily.        Follow-up Information     Georgann Housekeeper, MD Follow up on 09/14/2022.   Specialty: Internal Medicine Why: 2:30 for hospital follow up Contact information: [address] [place] [address] [phone]                Discharge Exam: Ceasar Mons Weights   09/07/22 0056 09/07/22 0500 09/08/22 0553  Weight: 47.8 kg 47.8 kg 47.4 kg   BP 136/72 (BP Location: Right Arm)   Pulse (!) 59   Temp 97.9 F (36.6 C) (Oral)   Resp  17   Ht 4\' 8"  (1.422 m)   Wt 47.4 kg   SpO2 99%   BMI 23.45 kg/m   Patient is feeling better, dyspnea has improved, no PND or orthopnea  Neurology awake and alert ENT with mild pallor Cardiovascular with S1 and S2 present and regular with gallops, rubs or murmurs No JVD No lower extremity edema Respiratory with no rales or wheezing, no rhonchi Abdomen with no distention   Condition at discharge: stable  The results of significant diagnostics from this hospitalization (including imaging, microbiology, ancillary and laboratory) are listed below for reference.   Imaging Studies: DG Chest 1 View  Result Date: 09/07/2022 CLINICAL DATA:  Status post thoracentesis EXAM: PORTABLE CHEST 1 VIEW COMPARISON:  09/05/2022 FINDINGS: Previously seen right pleural effusion as reduced significantly consistent with the recent thoracentesis. No pneumothorax is  noted. Small residual effusions are noted bilaterally. No focal confluent infiltrate is seen. No acute bony abnormality is noted. IMPRESSION: No pneumothorax following right thoracentesis. Electronically Signed   By: Alcide Clever M.D.   On: 09/07/2022 15:44   IR THORACENTESIS ASP PLEURAL SPACE W/IMG GUIDE  Result Date: 09/07/2022 INDICATION: Patient with a history of heart failure with recurrent pleural effusions. Interventional radiology asked to perform a diagnostic and therapeutic thoracentesis. EXAM: ULTRASOUND GUIDED THORACENTESIS MEDICATIONS: 1% lidocaine 10 mL COMPLICATIONS: None immediate. PROCEDURE: An ultrasound guided thoracentesis was thoroughly discussed with the patient and questions answered. The benefits, risks, alternatives and complications were also discussed. The patient understands and wishes to proceed with the procedure. Written consent was obtained. Ultrasound was performed to localize and mark an adequate pocket of fluid in the right chest. The area was then prepped and draped in the normal sterile fashion. 1% Lidocaine was used for local anesthesia. Under ultrasound guidance a 6 Fr Safe-T-Centesis catheter was introduced. Thoracentesis was performed. The catheter was removed and a dressing applied. FINDINGS: A total of approximately 900 mL of dark yellow fluid was removed. Samples were sent to the laboratory as requested by the clinical team. IMPRESSION: Successful ultrasound guided right thoracentesis yielding 900 mL of pleural fluid. Procedure performed by Alwyn Ren NP Electronically Signed   By: Richarda Overlie M.D.   On: 09/07/2022 13:32   CARDIAC CATHETERIZATION  Result Date: 09/06/2022 HEMODYNAMICS: RA:   4 mmHg (mean) RV:   37/2-4 mmHg PA:   43/10 mmHg (21 mean) L-PCWP:  11 mmHg (mean) R-PCWP: 3-5    Estimated Fick CO/CI   2.6 L/min, 1.9 L/min/m2    TPG    17  mmHg  (Based on R PCWP)   PVR     6.5 Wood Units PAPi      8  IMPRESSION: Low pre and post capillary filling  pressures ~61mmHg difference in R-PCWP & L-PCWP Severely elevated PVR Severely reduced cardiac output / cardiac index by Serina Cowper Sabharwal 10:19 AM  DG Chest 2 View  Result Date: 09/05/2022 CLINICAL DATA:  Shortness of breath EXAM: CHEST - 2 VIEW COMPARISON:  Chest x-ray dated May 02, 2018 FINDINGS: Cardiac and mediastinal contours are unchanged. Unchanged biapical pleural-parenchymal scarring. New moderate right pleural effusion. Small left pleural effusion, slightly increased in size when compared with the prior exam. No evidence of pneumothorax. Levocurvature of the thoracic spine. IMPRESSION: 1. New moderate right pleural effusion. 2. Small left pleural effusion, slightly increased in size when compared with the prior exam. Electronically Signed   By: Allegra Lai M.D.   On: 09/05/2022 13:25    Microbiology: Results for  orders placed or performed during the hospital encounter of 09/05/22  Culture, body fluid w Gram Stain-bottle     Status: None (Preliminary result)   Collection Time: 09/07/22  9:00 AM   Specimen: Thoracentesis  Result Value Ref Range Status   Specimen Description THORACENTESIS  Final   Special Requests NONE  Final   Culture   Final    NO GROWTH < 24 HOURS Performed at [facility], [address], [place], [place] [address]    Report Status PENDING  Incomplete  Gram stain     Status: None   Collection Time: 09/07/22  9:00 AM   Specimen: Thoracentesis  Result Value Ref Range Status   Specimen Description THORACENTESIS  Final   Special Requests NONE  Final   Gram Stain   Final    RARE WBC PRESENT, PREDOMINANTLY MONONUCLEAR NO ORGANISMS SEEN Performed at [facility], [address] [place], [place] [address]    Report Status 09/07/2022 FINAL  Final    Labs: CBC: Recent Labs  Lab 09/05/22 1251 09/06/22 0103 09/06/22 0959 09/06/22 1000 09/07/22 0812  WBC 7.4 6.4  --   --  7.3  HGB 9.7* 8.5* 9.9* 9.9* 9.1*  HCT 32.1* 27.1* 29.0*  29.0* 29.2*  MCV 90.2 88.3  --   --  90.7  PLT 281 238  --   --  257   Basic Metabolic Panel: Recent Labs  Lab 09/05/22 1251 09/06/22 0103 09/06/22 0959 09/06/22 1000 09/07/22 0812 09/08/22 0026  NA 136 139 142 142 135 134*  K 3.4* 4.0 4.6 4.6 3.8 3.8  CL 104 107  --   --  102 101  CO2 22 24  --   --  26 26  GLUCOSE 140* 89  --   --  119* 101*  BUN 17 18  --   --  25* 24*  CREATININE 1.00 1.16*  --   --  1.35* 1.38*  CALCIUM 8.6* 8.5*  --   --  8.5* 8.4*  MG 2.0  --   --   --   --   --    Liver Function Tests: Recent Labs  Lab 09/06/22 0103  AST 22  ALT 13  ALKPHOS 76  BILITOT 1.3*  PROT 6.9  ALBUMIN 2.7*   CBG: No results for input(s): "GLUCAP" in the last 168 hours.  Discharge time spent: greater than 30 minutes.  Signed: Coralie Keens, MD [facility] 09/08/2022

## 2022-09-08 NOTE — Evaluation (Signed)
Physical Therapy Evaluation Patient Details Name: Laurie James MRN: [id number] DOB: 1945-07-27 Today's Date: 09/08/2022  History of Present Illness  Pt is a 77 year old female admitted on 09/05/22 for SOB. Past medical history significant of mild cognitive impairment, tricuspid regurgitation, CHF, OSA, pulmonary hypertension, hyperlipidemia, gout, GERD, paroxysmal A-fib, macular degeneration, depression, COPD, CAD.  Clinical Impression  Pt presents with admitting diagnosis above. Pt was able to ambulate short distance in room independently stating that this is how far she walks at home. Pt presents at or near baseline mobility. Pt has no further acute PT needs and will be signing off. Re consult PT if mobility status changes.        Recommendations for follow up therapy are one component of a multi-disciplinary discharge planning process, led by the attending physician.  Recommendations may be updated based on patient status, additional functional criteria and insurance authorization.  Follow Up Recommendations       Assistance Recommended at Discharge PRN  Patient can return home with the following  Assist for transportation;Direct supervision/assist for medications management;Assistance with cooking/housework    Equipment Recommendations None recommended by PT  Recommendations for Other Services       Functional Status Assessment Patient has had a recent decline in their functional status and demonstrates the ability to make significant improvements in function in a reasonable and predictable amount of time.     Precautions / Restrictions Precautions Precautions: Fall;Other (comment) Precaution Comments: Pt states that she is legally blind Restrictions Weight Bearing Restrictions: No      Mobility  Bed Mobility Overal bed mobility: Modified Independent             General bed mobility comments: HOB elevated    Transfers Overall transfer level:  Independent Equipment used: None                    Ambulation/Gait Ambulation/Gait assistance: Independent Gait Distance (Feet): 15 Feet Assistive device: None Gait Pattern/deviations: WFL(Within Functional Limits), Trunk flexed Gait velocity: decreased     General Gait Details: Pt states that she only ambulates short distances at home and feels that she is at her baseline.  Stairs Stairs: Yes Stairs assistance: Independent Stair Management: One rail Left, Forwards, Alternating pattern Number of Stairs: 3 General stair comments: mimicked stairs in room by having pt hold onto bed rail and march in place.  Wheelchair Mobility    Modified Rankin (Stroke Patients Only)       Balance Overall balance assessment: No apparent balance deficits (not formally assessed)                                           Pertinent Vitals/Pain Pain Assessment Pain Assessment: No/denies pain    Home Living Family/patient expects to be discharged to:: Private residence Living Arrangements: Spouse/significant other Available Help at Discharge: Family;Available 24 hours/day Type of Home: House Home Access: Stairs to enter Entrance Stairs-Rails: None (Pt states there are "columns" on both sides but she cannot reach both at the same time) Entrance Stairs-Number of Steps: 3 Alternate Level Stairs-Number of Steps: flight Home Layout: Two level;Able to live on main level with bedroom/bathroom Home Equipment: Rolling Walker (2 wheels);Cane - single point;Shower seat;Wheelchair - manual;Grab bars - tub/shower;Hospital bed      Prior Function Prior Level of Function : Independent/Modified Independent  Mobility Comments: reports ind, relatively sedentary household distances only ADLs Comments: reports she can do all of her basic ADLs but her husband does A with dressing just because it is easier     Hand Dominance   Dominant Hand: Right     Extremity/Trunk Assessment   Upper Extremity Assessment Upper Extremity Assessment: Overall WFL for tasks assessed    Lower Extremity Assessment Lower Extremity Assessment: Overall WFL for tasks assessed    Cervical / Trunk Assessment Cervical / Trunk Assessment: Normal  Communication   Communication: No difficulties  Cognition Arousal/Alertness: Awake/alert Behavior During Therapy: WFL for tasks assessed/performed Overall Cognitive Status: Within Functional Limits for tasks assessed                                          General Comments General comments (skin integrity, edema, etc.): VSS on RA    Exercises     Assessment/Plan    PT Assessment Patient does not need any further PT services  PT Problem List         PT Treatment Interventions      PT Goals (Current goals can be found in the Care Plan section)       Frequency       Co-evaluation               AM-PAC PT "6 Clicks" Mobility  Outcome Measure Help needed turning from your back to your side while in a flat bed without using bedrails?: None Help needed moving from lying on your back to sitting on the side of a flat bed without using bedrails?: None Help needed moving to and from a bed to a chair (including a wheelchair)?: None Help needed standing up from a chair using your arms (e.g., wheelchair or bedside chair)?: None Help needed to walk in hospital room?: None Help needed climbing 3-5 steps with a railing? : None 6 Click Score: 24    End of Session Equipment Utilized During Treatment: Gait belt Activity Tolerance: Patient tolerated treatment well Patient left: in bed;with call bell/phone within reach Nurse Communication: Mobility status PT Visit Diagnosis: Other abnormalities of gait and mobility (R26.89)    Time: 4540-9811 PT Time Calculation (min) (ACUTE ONLY): 13 min   Charges:   PT Evaluation $PT Eval Low Complexity: 1 Low          Danisha Brassfield B, PT,  DPT Acute Rehab Services [phone]   Gladys Damme 09/08/2022, 9:28 AM

## 2022-09-08 NOTE — TOC Transition Note (Signed)
Transition of Care [facility]) - CM/SW Discharge Note   Patient Details  Name: Laurie James MRN: [id number] Date of Birth: December 05, 1945  [facility] CM/SW Contact:  Leone Haven, RN Phone Number: 09/08/2022, 11:35 AM   Clinical Narrative:    Patient is for dc today, daughter is at the bedside to transport her home. She states does not need any HH at this time.  TOC to fill her meds.      Barriers to Discharge: Continued Medical Work up   Patient Goals and CMS Choice   Choice offered to / list presented to : NA  Discharge Placement                         Discharge Plan and Services Additional resources added to the After Visit Summary for   In-house Referral: NA Discharge Planning Services: CM Consult Post Acute Care Choice: NA          DME Arranged: N/A DME Agency: NA       HH Arranged: NA          Social Determinants of Health (SDOH) Interventions SDOH Screenings   Food Insecurity: No Food Insecurity (09/05/2022)  Housing: Patient Unable To Answer (09/05/2022)  Transportation Needs: No Transportation Needs (09/05/2022)  Utilities: Not At Risk (09/05/2022)  Tobacco Use: Medium Risk (09/07/2022)     Readmission Risk Interventions    09/07/2022    3:25 PM  Readmission Risk Prevention Plan  Transportation Screening Complete  PCP or Specialist Appt within 5-7 Days Complete  Home Care Screening Complete  Medication Review (RN CM) Complete

## 2022-09-08 NOTE — Assessment & Plan Note (Signed)
Hyponatremia.   Renal function stable, at the time of her discharge serum cr at 1,38, with K at 3,8 and serum bicarbonate at 26. Na 134.  Plan to follow up renal function as outpatient.

## 2022-09-08 NOTE — Progress Notes (Signed)
OT Cancellation Note  Patient Details Name: Laurie James MRN: [id number] DOB: 1945-06-13   Cancelled Treatment:    Reason Eval/Treat Not Completed: OT screened, no needs identified, will sign off  Suzanna Obey 09/08/2022, 11:53 AM 09/08/2022  RP, OTR/L  Acute Rehabilitation Services  Office:  [phone]

## 2022-09-10 LAB — CULTURE, BODY FLUID W GRAM STAIN -BOTTLE

## 2022-09-11 ENCOUNTER — Telehealth: Payer: Self-pay | Admitting: Cardiovascular Disease

## 2022-09-11 LAB — LACTATE DEHYDROGENASE, PLEURAL OR PERITONEAL FLUID

## 2022-09-11 LAB — BODY FLUID CELL COUNT WITH DIFFERENTIAL
Eos, Fluid: 1 %
Neutrophil Count, Fluid: 11 % (ref 0–25)
Total Nucleated Cell Count, Fluid: 93 cu mm (ref 0–1000)

## 2022-09-11 LAB — GLUCOSE, PLEURAL OR PERITONEAL FLUID: Glucose, Fluid: 105 mg/dL

## 2022-09-11 LAB — PROTEIN, PLEURAL OR PERITONEAL FLUID

## 2022-09-11 NOTE — Telephone Encounter (Signed)
-----   Message from Laurie Stade, MD sent at 09/08/2022 10:20 AM EDT ----- This is patient that needs TOC for CHF and recurrent pleural effusions post ablation with Mealor and has seen DB for CHF  [place] Dec she needs PV CTA in 2 weeks or so would draw BMET/BNP prior to CTA ? Is PV stenosis post ablation I can read

## 2022-09-11 NOTE — Telephone Encounter (Signed)
TOC per Dr. Eden Emms has been scheduled for 06/12 at 1:30pm with Perlie Gold, PA.

## 2022-09-11 NOTE — Telephone Encounter (Signed)
Patient contacted regarding discharge from [facility] on 09/08/2022.  Patient understands to follow up with provider Perlie Gold, PA-C on 09/20/2022 at 1:30 PM at [facility] . Patient understands discharge instructions? Yes. Patient understands medications and regiment? Yes. Patient understands to bring all medications to this visit? Yes.  Ask patient:  Are you enrolled in My Chart: No. If no ask patient if they would like to enroll. Patient declined, states she is legally blind and cannot view MyChart.

## 2022-09-12 LAB — CULTURE, BODY FLUID W GRAM STAIN -BOTTLE: Culture: NO GROWTH

## 2022-09-14 DIAGNOSIS — D6869 Other thrombophilia: Secondary | ICD-10-CM | POA: Diagnosis not present

## 2022-09-14 DIAGNOSIS — G319 Degenerative disease of nervous system, unspecified: Secondary | ICD-10-CM | POA: Diagnosis not present

## 2022-09-14 DIAGNOSIS — I270 Primary pulmonary hypertension: Secondary | ICD-10-CM | POA: Diagnosis not present

## 2022-09-14 DIAGNOSIS — N1831 Chronic kidney disease, stage 3a: Secondary | ICD-10-CM | POA: Diagnosis not present

## 2022-09-14 DIAGNOSIS — I5033 Acute on chronic diastolic (congestive) heart failure: Secondary | ICD-10-CM | POA: Diagnosis not present

## 2022-09-14 DIAGNOSIS — E46 Unspecified protein-calorie malnutrition: Secondary | ICD-10-CM | POA: Diagnosis not present

## 2022-09-14 DIAGNOSIS — I4891 Unspecified atrial fibrillation: Secondary | ICD-10-CM | POA: Diagnosis not present

## 2022-09-14 DIAGNOSIS — F331 Major depressive disorder, recurrent, moderate: Secondary | ICD-10-CM | POA: Diagnosis not present

## 2022-09-14 DIAGNOSIS — I2781 Cor pulmonale (chronic): Secondary | ICD-10-CM | POA: Diagnosis not present

## 2022-09-20 ENCOUNTER — Ambulatory Visit: Payer: Medicare PPO | Admitting: Cardiology

## 2022-09-21 ENCOUNTER — Other Ambulatory Visit: Payer: Self-pay | Admitting: Cardiovascular Disease

## 2022-09-26 ENCOUNTER — Ambulatory Visit (HOSPITAL_COMMUNITY)
Admission: RE | Admit: 2022-09-26 | Discharge: 2022-09-26 | Disposition: A | Discharge status: Home / Self Care | Payer: Medicare PPO | Source: Ambulatory Visit | Attending: Internal Medicine | Admitting: Internal Medicine

## 2022-09-26 ENCOUNTER — Encounter (HOSPITAL_COMMUNITY): Payer: Self-pay | Admitting: Internal Medicine

## 2022-09-26 VITALS — BP 130/70 | HR 64 | Wt 106.6 lb

## 2022-09-26 DIAGNOSIS — Z7901 Long term (current) use of anticoagulants: Secondary | ICD-10-CM | POA: Diagnosis not present

## 2022-09-26 DIAGNOSIS — N1832 Chronic kidney disease, stage 3b: Secondary | ICD-10-CM | POA: Insufficient documentation

## 2022-09-26 DIAGNOSIS — I071 Rheumatic tricuspid insufficiency: Secondary | ICD-10-CM

## 2022-09-26 DIAGNOSIS — I700 Atherosclerosis of aorta: Secondary | ICD-10-CM | POA: Diagnosis not present

## 2022-09-26 DIAGNOSIS — I130 Hypertensive heart and chronic kidney disease with heart failure and stage 1 through stage 4 chronic kidney disease, or unspecified chronic kidney disease: Secondary | ICD-10-CM | POA: Diagnosis not present

## 2022-09-26 DIAGNOSIS — E46 Unspecified protein-calorie malnutrition: Secondary | ICD-10-CM | POA: Insufficient documentation

## 2022-09-26 DIAGNOSIS — H547 Unspecified visual loss: Secondary | ICD-10-CM | POA: Insufficient documentation

## 2022-09-26 DIAGNOSIS — I428 Other cardiomyopathies: Secondary | ICD-10-CM | POA: Insufficient documentation

## 2022-09-26 DIAGNOSIS — Z79899 Other long term (current) drug therapy: Secondary | ICD-10-CM | POA: Diagnosis not present

## 2022-09-26 DIAGNOSIS — R0602 Shortness of breath: Secondary | ICD-10-CM | POA: Diagnosis not present

## 2022-09-26 DIAGNOSIS — I5022 Chronic systolic (congestive) heart failure: Secondary | ICD-10-CM | POA: Diagnosis not present

## 2022-09-26 DIAGNOSIS — I480 Paroxysmal atrial fibrillation: Secondary | ICD-10-CM

## 2022-09-26 DIAGNOSIS — J449 Chronic obstructive pulmonary disease, unspecified: Secondary | ICD-10-CM | POA: Diagnosis not present

## 2022-09-26 DIAGNOSIS — Z6823 Body mass index (BMI) 23.0-23.9, adult: Secondary | ICD-10-CM | POA: Insufficient documentation

## 2022-09-26 DIAGNOSIS — J90 Pleural effusion, not elsewhere classified: Secondary | ICD-10-CM

## 2022-09-26 DIAGNOSIS — I2721 Secondary pulmonary arterial hypertension: Secondary | ICD-10-CM

## 2022-09-26 DIAGNOSIS — I2510 Atherosclerotic heart disease of native coronary artery without angina pectoris: Secondary | ICD-10-CM | POA: Diagnosis not present

## 2022-09-26 DIAGNOSIS — Z950 Presence of cardiac pacemaker: Secondary | ICD-10-CM | POA: Diagnosis not present

## 2022-09-26 DIAGNOSIS — Z87891 Personal history of nicotine dependence: Secondary | ICD-10-CM | POA: Insufficient documentation

## 2022-09-26 DIAGNOSIS — E875 Hyperkalemia: Secondary | ICD-10-CM | POA: Insufficient documentation

## 2022-09-26 LAB — COMPREHENSIVE METABOLIC PANEL
ALT: 16 U/L (ref 0–44)
AST: 25 U/L (ref 15–41)
Albumin: 2.8 g/dL — ABNORMAL LOW (ref 3.5–5.0)
Alkaline Phosphatase: 81 U/L (ref 38–126)
Anion gap: 10 (ref 5–15)
BUN: 21 mg/dL (ref 8–23)
CO2: 23 mmol/L (ref 22–32)
Calcium: 8.6 mg/dL — ABNORMAL LOW (ref 8.9–10.3)
Chloride: 103 mmol/L (ref 98–111)
Creatinine, Ser: 1.01 mg/dL — ABNORMAL HIGH (ref 0.44–1.00)
GFR, Estimated: 57 mL/min — ABNORMAL LOW (ref >60)
Glucose, Bld: 86 mg/dL (ref 70–99)
Potassium: 3.5 mmol/L (ref 3.5–5.1)
Sodium: 136 mmol/L (ref 135–145)
Total Bilirubin: 0.9 mg/dL (ref 0.3–1.2)
Total Protein: 7.4 g/dL (ref 6.5–8.1)

## 2022-09-26 LAB — BRAIN NATRIURETIC PEPTIDE: B Natriuretic Peptide: 836.7 pg/mL — ABNORMAL HIGH (ref 0.0–100.0)

## 2022-09-26 NOTE — Addendum Note (Signed)
Encounter addended by: Dolores Patty, MD on: 09/26/2022 1:33 PM  Actions taken: Clinical Note Signed

## 2022-09-26 NOTE — Patient Instructions (Signed)
Medication Changes:  No medication changes today   Lab Work:  Labs done today, your results will be available in MyChart, we will contact you for abnormal readings.   Testing/Procedures:  CHEST XRAY TODAY   Follow-Up in: 4 MONTHS- AROUND OCTOBER 2024-PLEASE CALL OUR OFFICE TO ARRANGE IN AUGUST 2024 NUMBER TO THE OFFICE IS [phone] OPTION 2  At the [facility], you and your health needs are our priority. We have a designated team specialized in the treatment of Heart Failure. This Care Team includes your primary Heart Failure Specialized Cardiologist (physician), Advanced Practice Providers (APPs- Physician Assistants and Nurse Practitioners), and Pharmacist who all work together to provide you with the care you need, when you need it.   You may see any of the following providers on your designated Care Team at your next follow up:  Dr. Arvilla Meres Dr. Marca Ancona Dr. Marcos Eke, NP Robbie Lis, [place] [facility] [place], [place] Brynda Peon, NP Karle Plumber, PharmD   Please be sure to bring in all your medications bottles to every appointment.   Need to Contact us:  If you have any questions or concerns before your next appointment please send [place] a message through [place] or call our office at [phone].    TO LEAVE A MESSAGE FOR THE NURSE SELECT OPTION 2, PLEASE LEAVE A MESSAGE INCLUDING: YOUR NAME DATE OF BIRTH CALL BACK NUMBER REASON FOR CALL**this is important as we prioritize the call backs  YOU WILL RECEIVE A CALL BACK THE SAME DAY AS LONG AS YOU CALL BEFORE 4:00 PM

## 2022-09-26 NOTE — Progress Notes (Addendum)
Advanced Heart Failure Clinic Note    PCP: Georgann Housekeeper, MD Primary Cardiologist: Charlton Haws, MD  HF Cardiologist: Dr. Gala Romney  HPI: Laurie James is a 77 y.o. woman with severe COPD, blindness, malnutrition/frailty, HTN, systolic HF due to NICM (diagnosed 7/22) and atrial fibrillation.   Admitted 6/22 with new onset AF. EF normal and RV dilated. Cath with nonobstructive CAD. Sent home with [facility] and rate control w/ plans for outpatient DCCV after 3 weeks of a/c.   Readmitted 7/22 with acute HF -> cardiogenic shock. TEE EF 20% RV moderately down RA massively dilated. Heavy smoke in RA/LA and aortic root. DCCV canceled. Initial Co-ox 38% and required milrinone. Had right sided thoracentesis on 07/13 and subsequent CXRs showed some reaccumulation. Diuresed with IV lasix. Po lasix PRN at discharge.  D/c weight 99 lbs.  Echo 03/21/21: EF 55-60% RV dilated with normal function. Severe TR.   Admitted 8/23 with afib RVR in the 150's. Echo 8/23  showed EF: 40-45%, RV function mildly reduced. Treated with amio.   AF ablation 03/08/22   Seen 03/31/22  in ED for mild hemoptysis. CT chest ok. Now resolved.   1/24 R thoracentesis   Echo 05/31/22, EF 50-55% severe TR  Admitted 5/24 with ADHF and recurrent R pleural effusion. Underwent R thora (00cc - transudative).  RHC 09/05/22 RA 4 PA 43/10 (21) PCWP 11 PVR 6.5 Fick 2.6/1.9  She presents today for hospital f/u with her granddaughter. Breathing much better. Can do basic activities without too much problem but gets fatigued  and SOB with any more activity. No edema, orthopnea or PND.   Cardiac Studies:  - Echo (8/23): EF 40-45%, grade II DD, RV mildly reduced, moderate TR  - Echo (6/23): EF 45-50%, RV ok, severe TR  - Echo (10/06/20): EF 55-60 %   - LHC (10/07/20) showed mild 40% segmental RCA stenosis as well as 50 to 60% second diagonal branch stenosis  RHC 6/22 RA 3 PA 17/2 (8) PCWP 7 PVR  0.25 WU Fick 2.6/2.0 (LVEF was low)  -  TEE (7/22): EF 20% RV moderately down RA massively dilated. Heavy smoke in RA/LA and aortic root.   Past Medical History:  Diagnosis Date   AMD (age related macular degeneration)    COPD (chronic obstructive pulmonary disease) (HCC)    Depression    Hiatal hernia    Intermittent low back pain    Memory impairment    Midsternal chest pain    a. 12/2011 Cardiac CTA Ca++ score of 103.3 (80th %), LAD <50p/m, RCA 50-75.   Osteoporosis    PAF (paroxysmal atrial fibrillation) (HCC) 09/2020   Paroxysmal atrial fibrillation with RVR (HCC) 11/28/2021   Vitamin B 12 deficiency    Current Outpatient Medications  Medication Sig Dispense Refill   albuterol (VENTOLIN HFA) 108 (90 Base) MCG/ACT inhaler Inhale 2 puffs into the lungs every 4 (four) hours as needed for wheezing.     amiodarone (PACERONE) 200 MG tablet Take 1 tablet (200 mg total) by mouth daily. 30 tablet 0   apixaban (ELIQUIS) 2.5 MG TABS tablet Take 1 tablet (2.5 mg total) by mouth 2 (two) times daily. 60 tablet 11   atorvastatin (LIPITOR) 40 MG tablet Take 1 tablet by mouth once daily 90 tablet 3   cyanocobalamin (,VITAMIN B-12,) 1000 MCG/ML injection Inject 1,000 mcg into the muscle every 30 (thirty) days.  3   furosemide (LASIX) 20 MG tablet Take 1 tablet (20 mg total) by mouth as needed. Leg edema  30 tablet 6   nitroGLYCERIN (NITROSTAT) 0.4 MG SL tablet Place 1 tablet (0.4 mg total) under the tongue every 5 (five) minutes x 3 doses as needed for chest pain. 25 tablet 0   ondansetron (ZOFRAN) 4 MG tablet Take 4 mg by mouth 3 (three) times daily as needed for nausea or vomiting.     pantoprazole (PROTONIX) 40 MG tablet Take 1 tablet (40 mg total) by mouth 2 (two) times daily. 180 tablet 3   No current facility-administered medications for this encounter.   Allergies  Allergen Reactions   Codeine Nausea And Vomiting and Other (See Comments)    Upsets the stomach   Social History   Socioeconomic History   Marital status: Married     Spouse name: Not on file   Number of children: Not on file   Years of education: Not on file   Highest education level: Not on file  Occupational History   Occupation: retired  Tobacco Use   Smoking status: Former    Packs/day: 1.00    Years: 65.00    Additional pack years: 0.00    Total pack years: 65.00    Types: Cigarettes    Quit date: 09/2020    Years since quitting: 2.0   Smokeless tobacco: Never   Tobacco comments:    Former smoker 04/04/22  Vaping Use   Vaping Use: Never used  Substance and Sexual Activity   Alcohol use: No    Alcohol/week: 0.0 standard drinks of alcohol    Comment: rarely   Drug use: No   Sexual activity: Not Currently  Other Topics Concern   Not on file  Social History Narrative   Not on file   Social Determinants of Health   Financial Resource Strain: Not on file  Food Insecurity: No Food Insecurity (09/05/2022)   Hunger Vital Sign    Worried About Running Out of Food in the Last Year: Never true    Ran Out of Food in the Last Year: Never true  Transportation Needs: No Transportation Needs (09/05/2022)   PRAPARE - Administrator, Civil Service (Medical): No    Lack of Transportation (Non-Medical): No  Physical Activity: Not on file  Stress: Not on file  Social Connections: Not on file  Intimate Partner Violence: Not At Risk (09/05/2022)   Humiliation, Afraid, Rape, and Kick questionnaire    Fear of Current or Ex-Partner: No    Emotionally Abused: No    Physically Abused: No    Sexually Abused: No   Family History  Problem Relation Age of Onset   Breast cancer Sister    Breast cancer Sister    BP 130/70   Pulse 64   Wt 48.4 kg (106 lb 9.6 oz)   SpO2 100%   BMI 23.90 kg/m   Wt Readings from Last 3 Encounters:  09/26/22 48.4 kg (106 lb 9.6 oz)  09/08/22 47.4 kg (104 lb 9.6 oz)  06/08/22 49 kg (108 lb)   PHYSICAL EXAM: General:  NAD. No resp difficult, arrived in [facility], frail HEENT: normal Neck: supple. no JVD.  Carotids 2+ bilat; no bruits. No lymphadenopathy or thryomegaly appreciated. Cor: PMI nondisplaced. Irregular rate & rhythm. No rubs, gallops or murmurs. Lungs: clear decreased throughout  Abdomen: soft, nontender, nondistended. No hepatosplenomegaly. No bruits or masses. Good bowel sounds. Extremities: no cyanosis, clubbing, rash, edema Neuro: alert & orientedx3, cranial nerves grossly intact. moves all 4 extremities w/o difficulty. Affect pleasant . ECG: Sinus with frequent  PACs. Personally reviewed  ASSESSMENT & PLAN:  1. Chronic Systolic Heart Failure - NICM - Echo (6/22): EF 20% RV moderately down RA massively dilated. Tachymediated from rapid Afib.  - LHC w/ nonobstructive CAD - Echo (12/22): EF normalized w/ restoration and maintenance of NSR, 55-60%, RV normal - Echo (5/23): EF back down, 45-50% (recurrent Afib w/ CVR) - Limited echo (8/23): EF 40-45%, RV mildly reduced, L/R atria severely dilated. Mild MR, Mod TR - Echo 2/24 EF 50-55% RV ok severe TR - RHC 09/05/22 RA 4 PA 43/10 (21) PCWP 11 PVR 6.5 Fick 2.6/1.9 - NYHA III, limited by physical deconditioning and COPD. Volume OK today. - Continue Lasix 20 mg PRN. - Off Entresto, spiro and Jardiance with recurrent AKI and hyperkalemia; failed x 2.  Will not rechallenge unless EF drops back down.  - Stable today. Check labs  2. PAH with severe TR - Echo 2/24 EF 50-55% RV ok severe TR - RHC 09/05/22 RA 4 PA 43/10 (21) PCWP 11 PVR 6.5 Fick 2.6/1.9 - Suspect primarily WHO Group 3 PH in setting of advanced COPD. Not candidate for selective pulmonary vasodilators - Will review with Structural team regarding candidacy for TV clip with functional TR. (She is reluctant to go to [place])  3. PAF  - she does not tolerate Afib well (prior h/o tachy mediated CM and cardiogenic shock). Long term suppression w/ amiodarone not ideal w/ COPD.  - s/p AF RFA 11/23 - followed in AF Clinic. Maintained on amio 200 daily - Remains in NSR on ECG  today. - Continue Eliquis 5 mg bid.  Denies abnormal bleeding.   4. Recurrent R pleural effusion - s/p tap 1/24 and 5/24 (transudative) - suspect hydro-pneumothorax in setting of probable cardiac cirrhosis (hepatic u/s 2022 suggestive of cirrhosis. CT 2024 no mention of cirrhosis) - may need pleurex but she is reluctant about this   5. H/o AKI on CKD Stage IIIB and Hyperkalemia - Had AKI in 10/23 Sherryll Burger and Jardiance stopped - Labs today    Arvilla Meres, MD  1:33 PM

## 2022-10-10 ENCOUNTER — Telehealth (HOSPITAL_COMMUNITY): Payer: Self-pay | Admitting: Cardiology

## 2022-10-10 MED ORDER — AMIODARONE HCL 200 MG PO TABS: 200.0000 mg | ORAL_TABLET | Freq: Every day | ORAL | 6 refills | Status: DC

## 2022-10-10 NOTE — Telephone Encounter (Signed)
Pt called to request refills °

## 2022-10-17 DIAGNOSIS — Z1331 Encounter for screening for depression: Secondary | ICD-10-CM | POA: Diagnosis not present

## 2022-10-17 DIAGNOSIS — D6869 Other thrombophilia: Secondary | ICD-10-CM | POA: Diagnosis not present

## 2022-10-17 DIAGNOSIS — H548 Legal blindness, as defined in USA: Secondary | ICD-10-CM | POA: Diagnosis not present

## 2022-10-17 DIAGNOSIS — I5022 Chronic systolic (congestive) heart failure: Secondary | ICD-10-CM | POA: Diagnosis not present

## 2022-10-17 DIAGNOSIS — D649 Anemia, unspecified: Secondary | ICD-10-CM | POA: Diagnosis not present

## 2022-10-17 DIAGNOSIS — N1831 Chronic kidney disease, stage 3a: Secondary | ICD-10-CM | POA: Diagnosis not present

## 2022-10-17 DIAGNOSIS — E785 Hyperlipidemia, unspecified: Secondary | ICD-10-CM | POA: Diagnosis not present

## 2022-10-17 DIAGNOSIS — I2781 Cor pulmonale (chronic): Secondary | ICD-10-CM | POA: Diagnosis not present

## 2022-10-17 DIAGNOSIS — Z0000 Encounter for general adult medical examination without abnormal findings: Secondary | ICD-10-CM | POA: Diagnosis not present

## 2022-10-17 DIAGNOSIS — E538 Deficiency of other specified B group vitamins: Secondary | ICD-10-CM | POA: Diagnosis not present

## 2022-10-27 ENCOUNTER — Inpatient Hospital Stay (HOSPITAL_COMMUNITY)
Admission: EM | Admit: 2022-10-27 | Discharge: 2022-10-30 | DRG: 291 | Disposition: A | Discharge status: Home / Self Care | Payer: Medicare PPO | Attending: Internal Medicine | Admitting: Internal Medicine

## 2022-10-27 ENCOUNTER — Encounter (HOSPITAL_COMMUNITY): Payer: Self-pay

## 2022-10-27 ENCOUNTER — Emergency Department (HOSPITAL_COMMUNITY): Payer: Medicare PPO

## 2022-10-27 DIAGNOSIS — I071 Rheumatic tricuspid insufficiency: Secondary | ICD-10-CM | POA: Diagnosis present

## 2022-10-27 DIAGNOSIS — J42 Unspecified chronic bronchitis: Principal | ICD-10-CM

## 2022-10-27 DIAGNOSIS — Z87891 Personal history of nicotine dependence: Secondary | ICD-10-CM

## 2022-10-27 DIAGNOSIS — I428 Other cardiomyopathies: Secondary | ICD-10-CM | POA: Diagnosis present

## 2022-10-27 DIAGNOSIS — J9811 Atelectasis: Secondary | ICD-10-CM | POA: Diagnosis present

## 2022-10-27 DIAGNOSIS — F32A Depression, unspecified: Secondary | ICD-10-CM | POA: Diagnosis present

## 2022-10-27 DIAGNOSIS — W228XXA Striking against or struck by other objects, initial encounter: Secondary | ICD-10-CM | POA: Diagnosis present

## 2022-10-27 DIAGNOSIS — Z79899 Other long term (current) drug therapy: Secondary | ICD-10-CM

## 2022-10-27 DIAGNOSIS — E538 Deficiency of other specified B group vitamins: Secondary | ICD-10-CM | POA: Diagnosis present

## 2022-10-27 DIAGNOSIS — R5381 Other malaise: Secondary | ICD-10-CM | POA: Diagnosis present

## 2022-10-27 DIAGNOSIS — J441 Chronic obstructive pulmonary disease with (acute) exacerbation: Secondary | ICD-10-CM | POA: Diagnosis present

## 2022-10-27 DIAGNOSIS — Z66 Do not resuscitate: Secondary | ICD-10-CM | POA: Diagnosis not present

## 2022-10-27 DIAGNOSIS — E876 Hypokalemia: Secondary | ICD-10-CM | POA: Diagnosis present

## 2022-10-27 DIAGNOSIS — N1832 Chronic kidney disease, stage 3b: Secondary | ICD-10-CM | POA: Diagnosis present

## 2022-10-27 DIAGNOSIS — I5043 Acute on chronic combined systolic (congestive) and diastolic (congestive) heart failure: Secondary | ICD-10-CM | POA: Diagnosis not present

## 2022-10-27 DIAGNOSIS — J90 Pleural effusion, not elsewhere classified: Secondary | ICD-10-CM | POA: Diagnosis not present

## 2022-10-27 DIAGNOSIS — D631 Anemia in chronic kidney disease: Secondary | ICD-10-CM | POA: Diagnosis not present

## 2022-10-27 DIAGNOSIS — R0602 Shortness of breath: Secondary | ICD-10-CM | POA: Diagnosis not present

## 2022-10-27 DIAGNOSIS — Z7901 Long term (current) use of anticoagulants: Secondary | ICD-10-CM

## 2022-10-27 DIAGNOSIS — R531 Weakness: Secondary | ICD-10-CM | POA: Diagnosis not present

## 2022-10-27 DIAGNOSIS — E039 Hypothyroidism, unspecified: Secondary | ICD-10-CM | POA: Diagnosis present

## 2022-10-27 DIAGNOSIS — H353190 Nonexudative age-related macular degeneration, unspecified eye, stage unspecified: Secondary | ICD-10-CM | POA: Diagnosis present

## 2022-10-27 DIAGNOSIS — I509 Heart failure, unspecified: Secondary | ICD-10-CM

## 2022-10-27 DIAGNOSIS — Z90710 Acquired absence of both cervix and uterus: Secondary | ICD-10-CM

## 2022-10-27 DIAGNOSIS — R000 Tachycardia, unspecified: Secondary | ICD-10-CM | POA: Diagnosis not present

## 2022-10-27 DIAGNOSIS — I130 Hypertensive heart and chronic kidney disease with heart failure and stage 1 through stage 4 chronic kidney disease, or unspecified chronic kidney disease: Principal | ICD-10-CM | POA: Diagnosis present

## 2022-10-27 DIAGNOSIS — I5041 Acute combined systolic (congestive) and diastolic (congestive) heart failure: Secondary | ICD-10-CM | POA: Diagnosis not present

## 2022-10-27 DIAGNOSIS — K219 Gastro-esophageal reflux disease without esophagitis: Secondary | ICD-10-CM | POA: Diagnosis present

## 2022-10-27 DIAGNOSIS — D509 Iron deficiency anemia, unspecified: Secondary | ICD-10-CM | POA: Diagnosis present

## 2022-10-27 DIAGNOSIS — R17 Unspecified jaundice: Secondary | ICD-10-CM | POA: Diagnosis not present

## 2022-10-27 DIAGNOSIS — Z885 Allergy status to narcotic agent status: Secondary | ICD-10-CM

## 2022-10-27 DIAGNOSIS — I272 Pulmonary hypertension, unspecified: Secondary | ICD-10-CM | POA: Diagnosis not present

## 2022-10-27 DIAGNOSIS — J918 Pleural effusion in other conditions classified elsewhere: Secondary | ICD-10-CM | POA: Diagnosis present

## 2022-10-27 DIAGNOSIS — Z1152 Encounter for screening for COVID-19: Secondary | ICD-10-CM | POA: Diagnosis not present

## 2022-10-27 DIAGNOSIS — S5011XA Contusion of right forearm, initial encounter: Secondary | ICD-10-CM | POA: Diagnosis present

## 2022-10-27 DIAGNOSIS — I480 Paroxysmal atrial fibrillation: Secondary | ICD-10-CM | POA: Diagnosis present

## 2022-10-27 DIAGNOSIS — Z48813 Encounter for surgical aftercare following surgery on the respiratory system: Secondary | ICD-10-CM | POA: Diagnosis not present

## 2022-10-27 DIAGNOSIS — I5021 Acute systolic (congestive) heart failure: Secondary | ICD-10-CM | POA: Diagnosis not present

## 2022-10-27 DIAGNOSIS — I2510 Atherosclerotic heart disease of native coronary artery without angina pectoris: Secondary | ICD-10-CM | POA: Diagnosis present

## 2022-10-27 DIAGNOSIS — M810 Age-related osteoporosis without current pathological fracture: Secondary | ICD-10-CM | POA: Diagnosis present

## 2022-10-27 DIAGNOSIS — J449 Chronic obstructive pulmonary disease, unspecified: Secondary | ICD-10-CM | POA: Diagnosis not present

## 2022-10-27 LAB — CBC
HCT: 27.7 % — ABNORMAL LOW (ref 36.0–46.0)
Hemoglobin: 8.3 g/dL — ABNORMAL LOW (ref 12.0–15.0)
MCH: 27.4 pg (ref 26.0–34.0)
MCHC: 30 g/dL (ref 30.0–36.0)
MCV: 91.4 fL (ref 80.0–100.0)
Platelets: 290 10*3/uL (ref 150–400)
RBC: 3.03 MIL/uL — ABNORMAL LOW (ref 3.87–5.11)
RDW: 18.2 % — ABNORMAL HIGH (ref 11.5–15.5)
WBC: 9.1 10*3/uL (ref 4.0–10.5)
nRBC: 0 % (ref 0.0–0.2)

## 2022-10-27 LAB — BASIC METABOLIC PANEL
Anion gap: 8 (ref 5–15)
BUN: 20 mg/dL (ref 8–23)
CO2: 23 mmol/L (ref 22–32)
Calcium: 8.6 mg/dL — ABNORMAL LOW (ref 8.9–10.3)
Chloride: 107 mmol/L (ref 98–111)
Creatinine, Ser: 1.17 mg/dL — ABNORMAL HIGH (ref 0.44–1.00)
GFR, Estimated: 48 mL/min — ABNORMAL LOW (ref >60)
Glucose, Bld: 95 mg/dL (ref 70–99)
Potassium: 3.5 mmol/L (ref 3.5–5.1)
Sodium: 138 mmol/L (ref 135–145)

## 2022-10-27 LAB — BRAIN NATRIURETIC PEPTIDE: B Natriuretic Peptide: 1045 pg/mL — ABNORMAL HIGH (ref 0.0–100.0)

## 2022-10-27 LAB — SARS CORONAVIRUS 2 BY RT PCR: SARS Coronavirus 2 by RT PCR: NEGATIVE

## 2022-10-27 LAB — TROPONIN I (HIGH SENSITIVITY): Troponin I (High Sensitivity): 21 ng/L — ABNORMAL HIGH (ref <18)

## 2022-10-27 MED ORDER — FUROSEMIDE 10 MG/ML IJ SOLN
40.0000 mg | Freq: Once | INTRAMUSCULAR | Status: AC
  Administered 2022-10-27: 40 mg via INTRAVENOUS
  Filled 2022-10-27: qty 4

## 2022-10-27 MED ORDER — IPRATROPIUM-ALBUTEROL 0.5-2.5 (3) MG/3ML IN SOLN
3.0000 mL | Freq: Once | RESPIRATORY_TRACT | Status: AC
  Administered 2022-10-27: 3 mL via RESPIRATORY_TRACT

## 2022-10-27 NOTE — H&P (Addendum)
History and Physical    [name] QIH:[id number] DOB: 1945-09-28 DOA: 10/27/2022  PCP: Georgann Housekeeper, MD  Patient coming from: Home  I have personally briefly reviewed patient's old medical records in [facility]  Chief Complaint:  sob x 3 days   HPI: Laurie James is a 77 y.o. female with medical history significant of pulmonary hypertension ,severe COPD not on home O2, blindness, chronic debility,systolic HF due to NICM (diagnosed 7/22), recurrent right sided pleural effusion s/p thoracentesis 5/30 as well as  atrial fibrillation. Patient presents to ED with 3 days of increasing sob/doe and weakness.  Patient notes no fever/ no chills no increase cough or wheezing from baseline. She denies chest pain n/v/d.  She has noted increase swelling in her lower extremities however. She notes she is not compliant with her salt intake but noted compliance with medications as well as fluid intake.   ED Course:  Vitals: Afeb , BP 150/63, Hr 63, rr 20 , sat 95% on ra  Wbc 9.1, hgb 8.3 (9.1),  plt 290 Na 138, K 3.5, CL107, CO2  23, cr 1.17, Calcium 8.6 GFR 48 EKG:  NSR, lateral twave inversions no significant change from prior  OVF:IEPPIRJJOA: 1. Enlarging right greater than left pleural effusions with associated bibasilar atelectasis. No overt pulmonary edema. 2. Chronic obstructive pulmonary disease.  CE 21 Respiratory panel Neg  BNP 1045 ( 836)  Tx duoneb, lasix 40mg  iv  Review of Systems: As per HPI otherwise 10 point review of systems negative.   Past Medical History:  Diagnosis Date   AMD (age related macular degeneration)    COPD (chronic obstructive pulmonary disease) (HCC)    Depression    Hiatal hernia    Intermittent low back pain    Memory impairment    Midsternal chest pain    a. 12/2011 Cardiac CTA Ca++ score of 103.3 (80th %), LAD <50p/m, RCA 50-75.   Osteoporosis    PAF (paroxysmal atrial fibrillation) (HCC) 09/2020   Paroxysmal  atrial fibrillation with RVR (HCC) 11/28/2021   Vitamin B 12 deficiency     Past Surgical History:  Procedure Laterality Date   ABDOMINAL HYSTERECTOMY     ATRIAL FIBRILLATION ABLATION N/A 03/08/2022   Procedure: ATRIAL FIBRILLATION ABLATION;  Surgeon: Maurice Small, MD;  Location: [facility] INVASIVE CV LAB;  Service: Cardiovascular;  Laterality: N/A;   IR THORACENTESIS ASP PLEURAL SPACE W/IMG GUIDE  10/20/2020   IR THORACENTESIS ASP PLEURAL SPACE W/IMG GUIDE  05/02/2022   IR THORACENTESIS ASP PLEURAL SPACE W/IMG GUIDE  09/07/2022   RIGHT HEART CATH N/A 09/06/2022   Procedure: RIGHT HEART CATH;  Surgeon: Dorthula Nettles, DO;  Location: [facility] INVASIVE CV LAB;  Service: Cardiovascular;  Laterality: N/A;   RIGHT/LEFT HEART CATH AND CORONARY ANGIOGRAPHY N/A 10/07/2020   Procedure: RIGHT/LEFT HEART CATH AND CORONARY ANGIOGRAPHY;  Surgeon: Runell Gess, MD;  Location: [facility] INVASIVE CV LAB;  Service: Cardiovascular;  Laterality: N/A;   TEE WITHOUT CARDIOVERSION N/A 10/25/2020   Procedure: TRANSESOPHAGEAL ECHOCARDIOGRAM (TEE);  Surgeon: Quintella Reichert, MD;  Location: [facility] ENDOSCOPY;  Service: Cardiovascular;  Laterality: N/A;     reports that she quit smoking about 2 years ago. Her smoking use included cigarettes. She started smoking about 67 years ago. She has a 65 pack-year smoking history. She has never used smokeless tobacco. She reports that she does not drink alcohol and does not use drugs.  Allergies  Allergen Reactions   Codeine Nausea And Vomiting and  Other (See Comments)    Upsets the stomach    Family History  Problem Relation Age of Onset   Breast cancer Sister    Breast cancer Sister     Prior to Admission medications   Medication Sig Start Date End Date Taking? Authorizing Provider  albuterol (VENTOLIN HFA) 108 (90 Base) MCG/ACT inhaler Inhale 2 puffs into the lungs every 4 (four) hours as needed for wheezing. 06/08/20  Yes [provider]  amiodarone (PACERONE) 200 MG  tablet Take 1 tablet (200 mg total) by mouth daily. 10/10/22  Yes Bensimhon, Bevelyn Buckles, MD  apixaban (ELIQUIS) 2.5 MG TABS tablet Take 1 tablet (2.5 mg total) by mouth 2 (two) times daily. 01/17/22  Yes Bensimhon, Bevelyn Buckles, MD  atorvastatin (LIPITOR) 40 MG tablet Take 1 tablet by mouth once daily 09/21/22  Yes Wendall Stade, MD  cyanocobalamin (,VITAMIN B-12,) 1000 MCG/ML injection Inject 1,000 mcg into the muscle every 30 (thirty) days. 06/11/16  Yes [provider]  furosemide (LASIX) 20 MG tablet Take 1 tablet (20 mg total) by mouth as needed. Leg edema Patient taking differently: Take 20 mg by mouth daily as needed for fluid. 12/30/21  Yes Milford, Anderson Malta, FNP  nitroGLYCERIN (NITROSTAT) 0.4 MG SL tablet Place 1 tablet (0.4 mg total) under the tongue every 5 (five) minutes x 3 doses as needed for chest pain. 10/11/20  Yes Georgie Chard D, NP  ondansetron (ZOFRAN) 4 MG tablet Take 4 mg by mouth 3 (three) times daily as needed for nausea or vomiting. 11/22/20  Yes [provider]  pantoprazole (PROTONIX) 40 MG tablet Take 1 tablet (40 mg total) by mouth 2 (two) times daily. 09/05/22  Yes Bensimhon, Bevelyn Buckles, MD    Physical Exam: Vitals:   10/27/22 1830 10/27/22 1845 10/27/22 1900 10/27/22 2159  BP: (!) 152/68 (!) 166/74 (!) 158/80   Pulse: 72  86   Resp: 18 19 18    Temp:    98 F (36.7 C)  TempSrc:      SpO2: 100% 100% 100%     Constitutional: NAD, calm, comfortable Vitals:   10/27/22 1830 10/27/22 1845 10/27/22 1900 10/27/22 2159  BP: (!) 152/68 (!) 166/74 (!) 158/80   Pulse: 72  86   Resp: 18 19 18    Temp:    98 F (36.7 C)  TempSrc:      SpO2: 100% 100% 100%    Eyes: PERRL, lids and conjunctivae normal ENMT: Mucous membranes are moist. Posterior pharynx clear of any exudate or lesions.Normal dentition.  Neck: normal, supple, no masses, no thyromegaly Respiratory: diminished at base. Normal respiratory effort. No accessory muscle use.  Cardiovascular: Regular  rate and rhythm, no murmurs / rubs / gallops. trace extremity edema. 2+ pedal pulses. Abdomen: no tenderness, no masses palpated. No hepatosplenomegaly. Bowel sounds positive.  Musculoskeletal: no clubbing / cyanosis. No joint deformity upper and lower extremities. Good ROM, no contractures. Normal muscle tone.  Skin: no rashes, lesions, ulcers. No induration Neurologic: CN 2-12 grossly intact. Sensation intact, . Strength 5/5 in all 4.  Psychiatric: Normal judgment and insight. Alert and oriented x 3. Normal mood.    Labs on Admission: I have personally reviewed following labs and imaging studies  CBC: Recent Labs  Lab 10/27/22 1449  WBC 9.1  HGB 8.3*  HCT 27.7*  MCV 91.4  PLT 290   Basic Metabolic Panel: Recent Labs  Lab 10/27/22 1449  NA 138  K 3.5  CL 107  CO2 23  GLUCOSE 95  BUN 20  CREATININE 1.17*  CALCIUM 8.6*   GFR: CrCl cannot be calculated (Unknown ideal weight.). Liver Function Tests: No results for input(s): "AST", "ALT", "ALKPHOS", "BILITOT", "PROT", "ALBUMIN" in the last 168 hours. No results for input(s): "LIPASE", "AMYLASE" in the last 168 hours. No results for input(s): "AMMONIA" in the last 168 hours. Coagulation Profile: No results for input(s): "INR", "PROTIME" in the last 168 hours. Cardiac Enzymes: No results for input(s): "CKTOTAL", "CKMB", "CKMBINDEX", "TROPONINI" in the last 168 hours. BNP (last 3 results) No results for input(s): "PROBNP" in the last 8760 hours. HbA1C: No results for input(s): "HGBA1C" in the last 72 hours. CBG: No results for input(s): "GLUCAP" in the last 168 hours. Lipid Profile: No results for input(s): "CHOL", "HDL", "LDLCALC", "TRIG", "CHOLHDL", "LDLDIRECT" in the last 72 hours. Thyroid Function Tests: No results for input(s): "TSH", "T4TOTAL", "FREET4", "T3FREE", "THYROIDAB" in the last 72 hours. Anemia Panel: No results for input(s): "VITAMINB12", "FOLATE", "FERRITIN", "TIBC", "IRON", "RETICCTPCT" in the last 72  hours. Urine analysis:    Component Value Date/Time   COLORURINE YELLOW 05/01/2022 1922   APPEARANCEUR HAZY (A) 05/01/2022 1922   LABSPEC 1.029 05/01/2022 1922   PHURINE 5.0 05/01/2022 1922   GLUCOSEU 50 (A) 05/01/2022 1922   HGBUR SMALL (A) 05/01/2022 1922   BILIRUBINUR NEGATIVE 05/01/2022 1922   KETONESUR NEGATIVE 05/01/2022 1922   PROTEINUR 100 (A) 05/01/2022 1922   NITRITE NEGATIVE 05/01/2022 1922   LEUKOCYTESUR NEGATIVE 05/01/2022 1922    Radiological Exams on Admission: DG Chest 2 View  Result Date: 10/27/2022 CLINICAL DATA:  Shortness of breath for 3 days. EXAM: CHEST - 2 VIEW COMPARISON:  Radiographs 09/26/2022, 09/07/2022 and 09/05/2022. Chest CT 03/30/2022. FINDINGS: Stable cardiomegaly and aortic atherosclerosis. There are enlarging right greater than left pleural effusions with associated dependent opacities at both lung bases, likely reflecting atelectasis. Vascular congestion without overt pulmonary edema. No evidence of pneumothorax. There is underlying chronic obstructive pulmonary disease with biapical scarring. The bones appear unchanged. IMPRESSION: 1. Enlarging right greater than left pleural effusions with associated bibasilar atelectasis. No overt pulmonary edema. 2. Chronic obstructive pulmonary disease. Electronically Signed   By: Carey Bullocks M.D.   On: 10/27/2022 15:29    EKG: Independently reviewed. See above   Assessment/Plan   Acute on chronic systolic heart failure /NICM -BJ47% -lasix 40 mg iv bid  -strict I/o / daily weights - Off Entresto, spiro and Jardiance due to history of recurrent AKI and hyperkalemia; failed x 2 per cards note   -echo in am  -ekg without hyperacute st -twave changes  -cardiology consult in am    Recurrent Right side Pleural effusion -presumed due to possible cardiac cirrhosis per cards - f/u in am with patient status  -can consider  thoracentesis  base on response to diurectics  CAD -most recent cath notes  nonobstructive disease   Atrial Fibrillation -continue on amiodarone -continue on Eliquis  Pulmonary Hypertension  -severe TR on Echo -due to advanced COPD not candidate for pulmonary vasodilators  CKDIIIb -at baseline    Hypertension  -note on BP medications at home   COPD not on home O2 -continue albuterol  -not on controller medications   Chronic debility -pt /ot    GERD -ppi  DVT prophylaxis: Eliquis Code Status: DNR/ as discussed per patient wishes in event of cardiac arrest  Family Communication: DTR  Sampson Si [phone] Disposition Plan: patient  expected to be admitted greater than 2 midnights  Consults called: n/a Admission  status: cardiac tele   Lurline Del MD [facility]   If 7PM-7AM, please contact night-coverage [email or web address] Password [facility]  10/27/2022, 11:27 PM

## 2022-10-27 NOTE — ED Notes (Signed)
Pt alert, NAD, calm, interactive. EDP into room. Here for similar in May. States, "feel better than I did in May. Do not feel like I need to be admitted". Denies pain, fever, syncope, nausea. Endorses some sob and productive cough. "Feel better than on arrival".

## 2022-10-27 NOTE — ED Triage Notes (Signed)
SHOB x 3 days, states that her levothyroxine causes her to get short of breath and she took it today. Pt has been using albuterol inhaler and it provides her some relief. Medic does not notices any wheezing or stridor. Otherwise pt is table and has no other complaints.    Medic vitals  138/82 74hr 16rr 98%ra 109bgl

## 2022-10-27 NOTE — ED Notes (Signed)
Pt alert, NAD, calm, interactive, resps e/u, family arrives now to [facility]. Ventricular rhythm, HR 63

## 2022-10-27 NOTE — ED Provider Notes (Signed)
[place] EMERGENCY DEPARTMENT AT [facility] Provider Note   CSN: [id number] Arrival date & time: 10/27/22  1427     History  Chief Complaint  Patient presents with   Shortness of Breath    Laurie James is a 77 y.o. female with history of A-fib on Eliquis, COPD (quit smoking 2 years ago, not on home O2), blindness, NICM, on Lasix as needed, presented to ED with complaint of shortness of breath.  Patient reports he began to feel very short of breath earlier today.  She says it is worse with exertion.  At rest it seems under control.  She denies chest pain or pressure.  She does not use breathing treatments at home.  She says she takes Lasix "only as needed" and has not been taking it regularly.  She denies leg swelling.  She does note a hematoma to her right forearm when she struck her arm on a car door 3 days ago.  Last cardiology evaluation 1 month ago.  She was noted to have a history of 2022 of cardiogenic shock with an EF of 20%, which subsequently has improved on repeat echocardiograms.  He has a history of A-fib status post ablation in November 2023, but continues on Eliquis and amiodarone for ongoing A-fib.  She also with a history of recurring pleural effusions with IR thoracentesis in January 2024  HPI     Home Medications Prior to Admission medications   Medication Sig Start Date End Date Taking? Authorizing Provider  albuterol (VENTOLIN HFA) 108 (90 Base) MCG/ACT inhaler Inhale 2 puffs into the lungs every 4 (four) hours as needed for wheezing. 06/08/20  Yes [provider]  amiodarone (PACERONE) 200 MG tablet Take 1 tablet (200 mg total) by mouth daily. 10/10/22  Yes Bensimhon, Bevelyn Buckles, MD  apixaban (ELIQUIS) 2.5 MG TABS tablet Take 1 tablet (2.5 mg total) by mouth 2 (two) times daily. 01/17/22  Yes Bensimhon, Bevelyn Buckles, MD  atorvastatin (LIPITOR) 40 MG tablet Take 1 tablet by mouth once daily 09/21/22  Yes Wendall Stade, MD  cyanocobalamin  (,VITAMIN B-12,) 1000 MCG/ML injection Inject 1,000 mcg into the muscle every 30 (thirty) days. 06/11/16  Yes [provider]  furosemide (LASIX) 20 MG tablet Take 1 tablet (20 mg total) by mouth as needed. Leg edema Patient taking differently: Take 20 mg by mouth daily as needed for fluid. 12/30/21  Yes Milford, Anderson Malta, FNP  nitroGLYCERIN (NITROSTAT) 0.4 MG SL tablet Place 1 tablet (0.4 mg total) under the tongue every 5 (five) minutes x 3 doses as needed for chest pain. 10/11/20  Yes Georgie Chard D, NP  ondansetron (ZOFRAN) 4 MG tablet Take 4 mg by mouth 3 (three) times daily as needed for nausea or vomiting. 11/22/20  Yes [provider]  pantoprazole (PROTONIX) 40 MG tablet Take 1 tablet (40 mg total) by mouth 2 (two) times daily. 09/05/22  Yes Bensimhon, Bevelyn Buckles, MD      Allergies    Codeine    Review of Systems   Review of Systems  Physical Exam Updated Vital Signs BP (!) 158/80   Pulse 86   Temp 98 F (36.7 C)   Resp 18   SpO2 100%  Physical Exam Constitutional:      General: She is not in acute distress. HENT:     Head: Normocephalic and atraumatic.  Eyes:     Conjunctiva/sclera: Conjunctivae normal.     Pupils: Pupils are equal, round, and  reactive to light.  Cardiovascular:     Rate and Rhythm: Normal rate and regular rhythm.  Pulmonary:     Effort: Pulmonary effort is normal. No respiratory distress.     Comments: Expiratory wheezing bilaterally, diminished breath sounds in the lung bases Abdominal:     General: There is no distension.     Tenderness: There is no abdominal tenderness.  Musculoskeletal:     Comments: Tender hematoma to the right forearm  Skin:    General: Skin is warm and dry.  Neurological:     General: No focal deficit present.     Mental Status: She is alert. Mental status is at baseline.  Psychiatric:        Mood and Affect: Mood normal.        Behavior: Behavior normal.     ED Results / Procedures / Treatments    Labs (all labs ordered are listed, but only abnormal results are displayed) Labs Reviewed  BASIC METABOLIC PANEL - Abnormal; Notable for the following components:      Result Value   Creatinine, Ser 1.17 (*)    Calcium 8.6 (*)    GFR, Estimated 48 (*)    All other components within normal limits  CBC - Abnormal; Notable for the following components:   RBC 3.03 (*)    Hemoglobin 8.3 (*)    HCT 27.7 (*)    RDW 18.2 (*)    All other components within normal limits  BRAIN NATRIURETIC PEPTIDE - Abnormal; Notable for the following components:   B Natriuretic Peptide 1,045.0 (*)    All other components within normal limits  SARS CORONAVIRUS 2 BY RT PCR  TROPONIN I (HIGH SENSITIVITY)    EKG EKG Interpretation Date/Time:  Friday October 27 2022 14:36:05 EDT Ventricular Rate:  63 PR Interval:  132 QRS Duration:  94 QT Interval:  462 QTC Calculation: 472 R Axis:   44  Text Interpretation: Normal sinus rhythm Septal infarct , age undetermined ST & T wave abnormality, consider inferior ischemia ST & T wave abnormality, consider anterolateral ischemia Abnormal ECG When compared with ECG of 26-Sep-2022 12:12, PREVIOUS ECG IS PRESENT No significant change since last tracing Confirmed by Margarita Grizzle [phone]) on 10/27/2022 2:48:22 PM  Radiology DG Chest 2 View  Result Date: 10/27/2022 CLINICAL DATA:  Shortness of breath for 3 days. EXAM: CHEST - 2 VIEW COMPARISON:  Radiographs 09/26/2022, 09/07/2022 and 09/05/2022. Chest CT 03/30/2022. FINDINGS: Stable cardiomegaly and aortic atherosclerosis. There are enlarging right greater than left pleural effusions with associated dependent opacities at both lung bases, likely reflecting atelectasis. Vascular congestion without overt pulmonary edema. No evidence of pneumothorax. There is underlying chronic obstructive pulmonary disease with biapical scarring. The bones appear unchanged. IMPRESSION: 1. Enlarging right greater than left pleural effusions with  associated bibasilar atelectasis. No overt pulmonary edema. 2. Chronic obstructive pulmonary disease. Electronically Signed   By: Carey Bullocks M.D.   On: 10/27/2022 15:29    Procedures Procedures    Medications Ordered in ED Medications  ipratropium-albuterol (DUONEB) 0.5-2.5 (3) MG/3ML nebulizer solution 3 mL (3 mLs Nebulization Given 10/27/22 1846)  furosemide (LASIX) injection 40 mg (40 mg Intravenous Given 10/27/22 1851)    ED Course/ Medical Decision Making/ A&P Clinical Course as of 10/27/22 2231  Fri Oct 27, 2022  2130 Admitted to hospitalist [MT]    Clinical Course User Index [MT] Renaye Rakers Kermit Balo, MD  Medical Decision Making Amount and/or Complexity of Data Reviewed Labs: ordered. Radiology: ordered.  Risk Prescription drug management. Decision regarding hospitalization.   This patient presents to the ED with concern for shortness of breath. This involves an extensive number of treatment options, and is a complaint that carries with it a high risk of complications and morbidity.  The differential diagnosis includes pneumonia versus pleural effusion for COPD exacerbation versus anemia versus other  Co-morbidities that complicate the patient evaluation: History of COPD, heart failure, and high risk of exacerbations  Additional history obtained from the patient's daughter at bedside  External records from outside source obtained and reviewed including cardiology office evaluation 1 month ago  I ordered and personally interpreted labs.  The pertinent results include:  hgb 8.3 (baseline 9-10), WBC wnl; Cr at baseline.  I ordered imaging studies including x-ray of the chest I independently visualized and interpreted imaging which showed moderate-sized right-sided pleural effusion I agree with the radiologist interpretation  The patient was maintained on a cardiac monitor.  I personally viewed and interpreted the cardiac monitored which  showed an underlying rhythm of: Rate controlled atrial fibrillation  Per my interpretation the patient's ECG shows rate controlled atrial fibrillation without acute ischemic findings  I ordered medication including Lasix for pulmonary edema and DuoNeb for suspected COPD exacerbation  I have reviewed the patients home medicines and have made adjustments as needed  Test Considered: Lower suspicion for acute PE.  Patient is compliant with her Eliquis.  After the interventions noted above, I reevaluated the patient and found that they have: stayed the same   Dispostion:  After consideration of the diagnostic results and the patients response to treatment, I feel that the patent would benefit from medical admission for continued monitoring overnight after diuresis, for breathing treatment, and for IR guided thoracentesis in the morning         Final Clinical Impression(s) / ED Diagnoses Final diagnoses:  COPD exacerbation (HCC)  Pleural effusion    Rx / DC Orders ED Discharge Orders     None         Terald Sleeper, MD 10/27/22 2231

## 2022-10-28 ENCOUNTER — Inpatient Hospital Stay (HOSPITAL_COMMUNITY): Payer: Medicare PPO

## 2022-10-28 DIAGNOSIS — I5021 Acute systolic (congestive) heart failure: Secondary | ICD-10-CM

## 2022-10-28 DIAGNOSIS — I5043 Acute on chronic combined systolic (congestive) and diastolic (congestive) heart failure: Secondary | ICD-10-CM

## 2022-10-28 LAB — COMPREHENSIVE METABOLIC PANEL
ALT: 19 U/L (ref 0–44)
AST: 32 U/L (ref 15–41)
Albumin: 3 g/dL — ABNORMAL LOW (ref 3.5–5.0)
Alkaline Phosphatase: 68 U/L (ref 38–126)
Anion gap: 11 (ref 5–15)
BUN: 19 mg/dL (ref 8–23)
CO2: 23 mmol/L (ref 22–32)
Calcium: 8.6 mg/dL — ABNORMAL LOW (ref 8.9–10.3)
Chloride: 103 mmol/L (ref 98–111)
Creatinine, Ser: 1.36 mg/dL — ABNORMAL HIGH (ref 0.44–1.00)
GFR, Estimated: 40 mL/min — ABNORMAL LOW (ref >60)
Glucose, Bld: 95 mg/dL (ref 70–99)
Potassium: 3.5 mmol/L (ref 3.5–5.1)
Sodium: 137 mmol/L (ref 135–145)
Total Bilirubin: 1.1 mg/dL (ref 0.3–1.2)
Total Protein: 7.3 g/dL (ref 6.5–8.1)

## 2022-10-28 LAB — ECHOCARDIOGRAM COMPLETE
AR max vel: 2.77 cm2
AV Area VTI: 2.59 cm2
AV Area mean vel: 2.66 cm2
AV Mean grad: 2 mmHg
AV Peak grad: 3.1 mmHg
AV Vena cont: 0.3 cm
Ao pk vel: 0.88 m/s
Area-P 1/2: 2.73 cm2
Height: 56 in
S' Lateral: 3.1 cm
Weight: 1820.12 oz

## 2022-10-28 LAB — RETICULOCYTES
Immature Retic Fract: 21.7 % — ABNORMAL HIGH (ref 2.3–15.9)
RBC.: 3.06 MIL/uL — ABNORMAL LOW (ref 3.87–5.11)
Retic Count, Absolute: 63.3 10*3/uL (ref 19.0–186.0)
Retic Ct Pct: 2.1 % (ref 0.4–3.1)

## 2022-10-28 LAB — CBC
HCT: 28 % — ABNORMAL LOW (ref 36.0–46.0)
Hemoglobin: 8.5 g/dL — ABNORMAL LOW (ref 12.0–15.0)
MCH: 27.2 pg (ref 26.0–34.0)
MCHC: 30.4 g/dL (ref 30.0–36.0)
MCV: 89.5 fL (ref 80.0–100.0)
Platelets: 282 10*3/uL (ref 150–400)
RBC: 3.13 MIL/uL — ABNORMAL LOW (ref 3.87–5.11)
RDW: 18.2 % — ABNORMAL HIGH (ref 11.5–15.5)
WBC: 8 10*3/uL (ref 4.0–10.5)
nRBC: 0 % (ref 0.0–0.2)

## 2022-10-28 LAB — IRON AND TIBC
Iron: 32 ug/dL (ref 28–170)
Saturation Ratios: 7 % — ABNORMAL LOW (ref 10.4–31.8)
TIBC: 494 ug/dL — ABNORMAL HIGH (ref 250–450)
UIBC: 462 ug/dL

## 2022-10-28 LAB — FOLATE: Folate: 17.5 ng/mL (ref >5.9)

## 2022-10-28 LAB — FERRITIN: Ferritin: 15 ng/mL (ref 11–307)

## 2022-10-28 LAB — T4, FREE: Free T4: 0.57 ng/dL — ABNORMAL LOW (ref 0.61–1.12)

## 2022-10-28 LAB — VITAMIN B12: Vitamin B-12: 642 pg/mL (ref 180–914)

## 2022-10-28 LAB — TSH: TSH: 41.541 u[IU]/mL — ABNORMAL HIGH (ref 0.350–4.500)

## 2022-10-28 LAB — TROPONIN I (HIGH SENSITIVITY): Troponin I (High Sensitivity): 28 ng/L — ABNORMAL HIGH (ref <18)

## 2022-10-28 MED ORDER — FUROSEMIDE 10 MG/ML IJ SOLN
40.0000 mg | Freq: Two times a day (BID) | INTRAMUSCULAR | Status: DC
  Administered 2022-10-28 (×2): 40 mg via INTRAVENOUS
  Filled 2022-10-28 (×2): qty 4

## 2022-10-28 MED ORDER — ATORVASTATIN CALCIUM 40 MG PO TABS
40.0000 mg | ORAL_TABLET | Freq: Every day | ORAL | Status: DC
  Administered 2022-10-28 – 2022-10-30 (×3): 40 mg via ORAL
  Filled 2022-10-28 (×3): qty 1

## 2022-10-28 MED ORDER — LIDOCAINE HCL (PF) 1 % IJ SOLN
5.0000 mL | Freq: Once | INTRAMUSCULAR | Status: AC
  Administered 2022-10-28: 5 mL via INTRADERMAL

## 2022-10-28 MED ORDER — AMIODARONE HCL 200 MG PO TABS
200.0000 mg | ORAL_TABLET | Freq: Every day | ORAL | Status: DC
  Administered 2022-10-28 – 2022-10-30 (×3): 200 mg via ORAL
  Filled 2022-10-28 (×3): qty 1

## 2022-10-28 MED ORDER — APIXABAN 2.5 MG PO TABS
2.5000 mg | ORAL_TABLET | Freq: Two times a day (BID) | ORAL | Status: DC
  Administered 2022-10-28 – 2022-10-30 (×6): 2.5 mg via ORAL
  Filled 2022-10-28 (×6): qty 1

## 2022-10-28 MED ORDER — ALBUTEROL SULFATE (2.5 MG/3ML) 0.083% IN NEBU: 3.0000 mL | INHALATION_SOLUTION | RESPIRATORY_TRACT | Status: DC | PRN

## 2022-10-28 MED ORDER — PANTOPRAZOLE SODIUM 40 MG PO TBEC
40.0000 mg | DELAYED_RELEASE_TABLET | Freq: Two times a day (BID) | ORAL | Status: DC
  Administered 2022-10-28 – 2022-10-30 (×6): 40 mg via ORAL
  Filled 2022-10-28 (×6): qty 1

## 2022-10-28 MED ORDER — ACETAMINOPHEN 325 MG PO TABS
650.0000 mg | ORAL_TABLET | Freq: Four times a day (QID) | ORAL | Status: DC | PRN
  Administered 2022-10-28 – 2022-10-29 (×2): 650 mg via ORAL
  Filled 2022-10-28 (×3): qty 2

## 2022-10-28 MED ORDER — NITROGLYCERIN 0.4 MG SL SUBL: 0.4000 mg | SUBLINGUAL_TABLET | SUBLINGUAL | Status: DC | PRN

## 2022-10-28 MED ORDER — FUROSEMIDE 10 MG/ML IJ SOLN: 40.0000 mg | Freq: Every day | INTRAMUSCULAR | Status: DC

## 2022-10-28 MED ORDER — PERFLUTREN LIPID MICROSPHERE
1.0000 mL | INTRAVENOUS | Status: AC | PRN
  Administered 2022-10-28: 5 mL via INTRAVENOUS

## 2022-10-28 MED ORDER — ACETAMINOPHEN 650 MG RE SUPP: 650.0000 mg | Freq: Four times a day (QID) | RECTAL | Status: DC | PRN

## 2022-10-28 NOTE — Progress Notes (Signed)
Patient called RN concerned about code status. She wishes to be a full code. I explained to her being full code includes compression, medications, and a breathing tube to keep her alive in the event her heart stops beating/she stops breathing. Pt stated, "Yes do anything you need to to keep me alive." MD notified.

## 2022-10-28 NOTE — Procedures (Signed)
PROCEDURE SUMMARY:  Successful US guided right thoracentesis. Yielded 1.0 liters of yellow fluid. Pt tolerated procedure well. No immediate complications.  Specimen was not sent for labs. CXR ordered.  EBL < 5 mL  Hoyt Koch PA-C 10/28/2022 9:52 AM

## 2022-10-28 NOTE — Progress Notes (Signed)
*  PRELIMINARY RESULTS* Echocardiogram 2D Echocardiogram has been performed.  Laddie Aquas 10/28/2022, 2:11 PM

## 2022-10-28 NOTE — Plan of Care (Signed)

## 2022-10-28 NOTE — Progress Notes (Signed)
The patient is admitted from ED to 3 E. 08. A & O x 4 . Denied any acute pain. She's oriented to her room,ascom and call bell. She voiced no concern. Staff will continue to monitor.

## 2022-10-28 NOTE — ED Notes (Signed)
ED TO INPATIENT HANDOFF REPORT  ED Nurse Name and Phone #:   S Name/Age/Gender Laurie James 77 y.o. female Room/Bed: 009C/009C  Code Status   Code Status: DNR  Home/SNF/Other Home Patient oriented to: self, place, time, and situation Is this baseline? Yes   Triage Complete: Triage complete  Chief Complaint Acute CHF (congestive heart failure) (HCC) [I50.9]  Triage Note SHOB x 3 days, states that her levothyroxine causes her to get short of breath and she took it today. Pt has been using albuterol inhaler and it provides her some relief. Medic does not notices any wheezing or stridor. Otherwise pt is table and has no other complaints.    Medic vitals  138/82 74hr 16rr 98%ra 109bgl       Allergies Allergies  Allergen Reactions   Codeine Nausea And Vomiting and Other (See Comments)    Upsets the stomach    Level of Care/Admitting Diagnosis ED Disposition     ED Disposition  Admit   Condition  --   Comment  Hospital [facility]: [facility] [[id number]]  Level of Care: Telemetry Cardiac [[id number]]  May admit patient to [facility] or [facility] if equivalent level of care is available:: No  Covid Evaluation: Asymptomatic - no recent exposure (last 10 days) testing not required  Diagnosis: Acute CHF (congestive heart failure) [facility]) [[id number]]  Admitting Physician: Lurline Del [[id number]]  Attending Physician: Lurline Del [[id number]]  Certification:: I certify this patient will need inpatient services for at least 2 midnights  Estimated Length of Stay: 3          B Medical/Surgery History Past Medical History:  Diagnosis Date   AMD (age related macular degeneration)    COPD (chronic obstructive pulmonary disease) (HCC)    Depression    Hiatal hernia    Intermittent low back pain    Memory impairment    Midsternal chest pain    a. 12/2011 Cardiac CTA Ca++ score of 103.3 (80th %), LAD <50p/m, RCA 50-75.   Osteoporosis     PAF (paroxysmal atrial fibrillation) (HCC) 09/2020   Paroxysmal atrial fibrillation with RVR (HCC) 11/28/2021   Vitamin B 12 deficiency    Past Surgical History:  Procedure Laterality Date   ABDOMINAL HYSTERECTOMY     ATRIAL FIBRILLATION ABLATION N/A 03/08/2022   Procedure: ATRIAL FIBRILLATION ABLATION;  Surgeon: Maurice Small, MD;  Location: [facility] INVASIVE CV LAB;  Service: Cardiovascular;  Laterality: N/A;   IR THORACENTESIS ASP PLEURAL SPACE W/IMG GUIDE  10/20/2020   IR THORACENTESIS ASP PLEURAL SPACE W/IMG GUIDE  05/02/2022   IR THORACENTESIS ASP PLEURAL SPACE W/IMG GUIDE  09/07/2022   RIGHT HEART CATH N/A 09/06/2022   Procedure: RIGHT HEART CATH;  Surgeon: Dorthula Nettles, DO;  Location: [facility] INVASIVE CV LAB;  Service: Cardiovascular;  Laterality: N/A;   RIGHT/LEFT HEART CATH AND CORONARY ANGIOGRAPHY N/A 10/07/2020   Procedure: RIGHT/LEFT HEART CATH AND CORONARY ANGIOGRAPHY;  Surgeon: Runell Gess, MD;  Location: [facility] INVASIVE CV LAB;  Service: Cardiovascular;  Laterality: N/A;   TEE WITHOUT CARDIOVERSION N/A 10/25/2020   Procedure: TRANSESOPHAGEAL ECHOCARDIOGRAM (TEE);  Surgeon: Quintella Reichert, MD;  Location: [facility] ENDOSCOPY;  Service: Cardiovascular;  Laterality: N/A;     A IV Location/Drains/Wounds Patient Lines/Drains/Airways Status     Active Line/Drains/Airways     Name Placement date Placement time Site Days   Peripheral IV 10/27/22 20 G 1" Anterior;Left Forearm 10/27/22  1849  Forearm  1   Wound /  Incision (Open or Dehisced) 10/20/20 Skin tear Pretibial Left 3cm skin tear to left shin 10/20/20  1055  Pretibial  738   Wound / Incision (Open or Dehisced) 09/05/22 Skin tear Ankle Anterior;Left Skin Tear 09/05/22  1745  Ankle  53            Intake/Output Last 24 hours  Intake/Output Summary (Last 24 hours) at 10/28/2022 0154 Last data filed at 10/28/2022 0000 Gross per 24 hour  Intake --  Output 850 ml  Net -850 ml    Labs/Imaging Results for orders placed or  performed during the hospital encounter of 10/27/22 (from the past 48 hour(s))  Basic metabolic panel     Status: Abnormal   Collection Time: 10/27/22  2:49 PM  Result Value Ref Range   Sodium 138 135 - 145 mmol/L   Potassium 3.5 3.5 - 5.1 mmol/L   Chloride 107 98 - 111 mmol/L   CO2 23 22 - 32 mmol/L   Glucose, Bld 95 70 - 99 mg/dL    Comment: Glucose reference range applies only to samples taken after fasting for at least 8 hours.   BUN 20 8 - 23 mg/dL   Creatinine, Ser 1.61 (H) 0.44 - 1.00 mg/dL   Calcium 8.6 (L) 8.9 - 10.3 mg/dL   GFR, Estimated 48 (L) >60 mL/min    Comment: (NOTE) Calculated using the CKD-EPI Creatinine Equation (2021)    Anion gap 8 5 - 15    Comment: Performed at [facility], [address] [place], [place] [address]  CBC     Status: Abnormal   Collection Time: 10/27/22  2:49 PM  Result Value Ref Range   WBC 9.1 4.0 - 10.5 K/uL   RBC 3.03 (L) 3.87 - 5.11 MIL/uL   Hemoglobin 8.3 (L) 12.0 - 15.0 g/dL   HCT 54.0 (L) 98.1 - 19.1 %   MCV 91.4 80.0 - 100.0 fL   MCH 27.4 26.0 - 34.0 pg   MCHC 30.0 30.0 - 36.0 g/dL   RDW 47.8 (H) 29.5 - 62.1 %   Platelets 290 150 - 400 K/uL   nRBC 0.0 0.0 - 0.2 %    Comment: Performed at [facility], [address] [place], [place] [address]  SARS Coronavirus 2 by RT PCR (hospital order, performed in [facility] hospital lab) *cepheid single result test* Anterior Nasal Swab     Status: None   Collection Time: 10/27/22  6:09 PM   Specimen: Anterior Nasal Swab  Result Value Ref Range   SARS Coronavirus 2 by RT PCR NEGATIVE NEGATIVE    Comment: Performed at [facility], [address] [place], [place] [address]  Brain natriuretic peptide     Status: Abnormal   Collection Time: 10/27/22  6:45 PM  Result Value Ref Range   B Natriuretic Peptide 1,045.0 (H) 0.0 - 100.0 pg/mL    Comment: Performed at [facility], [address], [place], [place] [address]  Troponin I (High Sensitivity)     Status:  Abnormal   Collection Time: 10/27/22 10:09 PM  Result Value Ref Range   Troponin I (High Sensitivity) 21 (H) <18 ng/L    Comment: (NOTE) Elevated high sensitivity troponin I (hsTnI) values and significant  changes across serial measurements may suggest ACS but many other  chronic and acute conditions are known to elevate hsTnI results.  Refer to the "Links" section for chest pain algorithms and additional  guidance. Performed at  [facility], [address], [place], [place] [address]   CBC     Status: Abnormal   Collection Time: 10/28/22 12:29 AM  Result Value Ref Range   WBC 8.0 4.0 - 10.5 K/uL   RBC 3.13 (L) 3.87 - 5.11 MIL/uL   Hemoglobin 8.5 (L) 12.0 - 15.0 g/dL   HCT 60.4 (L) 54.0 - 98.1 %   MCV 89.5 80.0 - 100.0 fL   MCH 27.2 26.0 - 34.0 pg   MCHC 30.4 30.0 - 36.0 g/dL   RDW 19.1 (H) 47.8 - 29.5 %   Platelets 282 150 - 400 K/uL   nRBC 0.0 0.0 - 0.2 %    Comment: Performed at [facility], [address] [place], [place] [address]  Vitamin B12     Status: None   Collection Time: 10/28/22 12:29 AM  Result Value Ref Range   Vitamin B-12 642 180 - 914 pg/mL    Comment: (NOTE) This assay is not validated for testing neonatal or myeloproliferative syndrome specimens for Vitamin B12 levels. Performed at [facility], [address], [place], [place] [address]   Reticulocytes     Status: Abnormal   Collection Time: 10/28/22 12:29 AM  Result Value Ref Range   Retic Ct Pct 2.1 0.4 - 3.1 %   RBC. 3.06 (L) 3.87 - 5.11 MIL/uL   Retic Count, Absolute 63.3 19.0 - 186.0 K/uL   Immature Retic Fract 21.7 (H) 2.3 - 15.9 %    Comment: Performed at [facility], [address] [place], [place] [address]   DG Chest 2 View  Result Date: 10/27/2022 CLINICAL DATA:  Shortness of breath for 3 days. EXAM: CHEST - 2 VIEW COMPARISON:  Radiographs 09/26/2022, 09/07/2022 and 09/05/2022. Chest CT 03/30/2022. FINDINGS: Stable cardiomegaly and aortic atherosclerosis.  There are enlarging right greater than left pleural effusions with associated dependent opacities at both lung bases, likely reflecting atelectasis. Vascular congestion without overt pulmonary edema. No evidence of pneumothorax. There is underlying chronic obstructive pulmonary disease with biapical scarring. The bones appear unchanged. IMPRESSION: 1. Enlarging right greater than left pleural effusions with associated bibasilar atelectasis. No overt pulmonary edema. 2. Chronic obstructive pulmonary disease. Electronically Signed   By: Carey Bullocks M.D.   On: 10/27/2022 15:29    Pending Labs Unresulted Labs (From admission, onward)     Start     Ordered   10/28/22 0500  Comprehensive metabolic panel  Tomorrow morning,   R        10/28/22 0003   10/28/22 0029  Folate  Once,   R        10/28/22 0029   10/28/22 0029  TSH  Once,   R        10/28/22 0029   10/28/22 0004  Iron and TIBC  (Anemia Panel (PNL))  Once,   R        10/28/22 0003   10/28/22 0004  Ferritin  (Anemia Panel (PNL))  Once,   R        10/28/22 0003   Unscheduled  Occult blood card to lab, stool  As needed,   R      10/28/22 0003            Vitals/Pain Today's Vitals   10/28/22 0045 10/28/22 0100 10/28/22 0115 10/28/22 0148  BP: 132/70 133/68 (!) 147/64   Pulse: 62 (!) 59 (!) 57   Resp: 19 13 19    Temp:  98.4 F (36.9 C)  TempSrc:      SpO2: 100% 100% 100%   PainSc:        Isolation Precautions No active isolations  Medications Medications  amiodarone (PACERONE) tablet 200 mg (has no administration in time range)  atorvastatin (LIPITOR) tablet 40 mg (has no administration in time range)  nitroGLYCERIN (NITROSTAT) SL tablet 0.4 mg (has no administration in time range)  pantoprazole (PROTONIX) EC tablet 40 mg (40 mg Oral Given 10/28/22 0122)  apixaban (ELIQUIS) tablet 2.5 mg (2.5 mg Oral Given 10/28/22 0122)  albuterol (PROVENTIL) (2.5 MG/3ML) 0.083% nebulizer solution 3 mL (has no administration in time  range)  furosemide (LASIX) injection 40 mg (40 mg Intravenous Given 10/28/22 0122)  acetaminophen (TYLENOL) tablet 650 mg (has no administration in time range)    Or  acetaminophen (TYLENOL) suppository 650 mg (has no administration in time range)  ipratropium-albuterol (DUONEB) 0.5-2.5 (3) MG/3ML nebulizer solution 3 mL (3 mLs Nebulization Given 10/27/22 1846)  furosemide (LASIX) injection 40 mg (40 mg Intravenous Given 10/27/22 1851)    Mobility walks     Focused Assessments    R Recommendations: See Admitting Provider Note  Report given to:   Additional Notes:

## 2022-10-28 NOTE — Plan of Care (Signed)
  Problem: Education: Goal: Ability to demonstrate management of disease process will improve Outcome: Progressing Goal: Ability to verbalize understanding of medication therapies will improve Outcome: Progressing   Problem: Activity: Goal: Capacity to carry out activities will improve Outcome: Progressing   Problem: Cardiac: Goal: Ability to achieve and maintain adequate cardiopulmonary perfusion will improve Outcome: Progressing   

## 2022-10-28 NOTE — Progress Notes (Addendum)
PROGRESS NOTE  Laurie James [place] BTD:[id number] DOB: Dec 21, 1945 DOA: 10/27/2022 PCP: Georgann Housekeeper, MD  HPI/Recap of past 24 hours: Laurie James Laurie James [place] is a 77 y.o. female with medical history significant of pulmonary hypertension ,severe COPD not on home O2, blindness, chronic debility, systolic HF due to NICM (diagnosed 10/2020), recurrent right sided pleural effusion s/p thoracentesis 5/30 as well as atrial fibrillation. Patient presents to ED with 3 days of increasing sob/doe, weakness, worsening swelling in BLE. CXR showed enlarging right greater than left pleural effusions with associated bibasilar atelectasis, no overt pulmonary edema. BNP 1045.  Patient admitted for further management.   Today, saw pt after thoracentesis, reports feeling better. Denies any L sided chest pain, fever/chills. Noted a large swelling/hematoma noted on R forearm (reports she hit her forearm while trying to get into her daughters truck)    Assessment/Plan: Principal Problem:   Acute CHF (congestive heart failure) (HCC)   Acute on chronic systolic and diastolic heart failure /NICM BNP 1,045 ECHO on 05/2022 showed EF of 50 to 55%, no regional wall motion abnormality, severe tricuspid valve regurgitation, repeat pending  Continue IV lasix 40 mg daily Off Entresto, spiro and Jardiance due to history of recurrent AKI and hyperkalemia; failed x 2 per cards note Strict I/O, daily weights May involve cardiology pending repeat ECHO and clinical response   Recurrent right sided pleural effusion Presumed due to possible cardiac cirrhosis per cards S/p thoracentesis on 10/28/22, yielding 1 liters of yellow fluid, no specimen was sent out Supplemental O2 as needed   Paroxysmal Atrial Fibrillation Continue on amiodarone Continue on Eliquis   Anemia of CKD Iron def anemia  Hemoglobin between 8-9 Anemia panel showed sats 7%, iron 32, ferritin 15 Daily CBC   Pulmonary Hypertension  Severe  TR on Echo Due to advanced COPD not candidate for pulmonary vasodilators   CKDIIIa At baseline  Daily BMP   COPD not on home O2 Continue albuterol  Supplemental O2 as needed  Hypothyroidism TSH 41, free T4-->0.57 Reports synthroid causes her to be SOB  R forearm hematoma Reports she struck her forearm while trying to get into her daughters truck Monitor   Chronic debility pt /ot     GERD ppi    Estimated body mass index is 25.5 kg/m as calculated from the following:   Height as of this encounter: 4\' 8"  (1.422 m).   Weight as of this encounter: 51.6 kg.     Code Status: DNR  Family Communication: None at bedside  Disposition Plan: Status is: Inpatient Remains inpatient appropriate because: Level of care      Consultants: None  Procedures: Thoracentesis on 7/20  Antimicrobials: None  DVT prophylaxis:  Eliquis   Objective: Vitals:   10/28/22 0700 10/28/22 0726 10/28/22 0925 10/28/22 1010  BP:  134/68 127/65 120/63  Pulse: 60 62  66  Resp: 15 19  20   Temp:  98 F (36.7 C)  97.6 F (36.4 C)  TempSrc:  Oral  Oral  SpO2: 99% 100%  100%  Weight:      Height:    4\' 8"  (1.422 m)    Intake/Output Summary (Last 24 hours) at 10/28/2022 1339 Last data filed at 10/28/2022 0000 Gross per 24 hour  Intake --  Output 850 ml  Net -850 ml   Filed Weights   10/28/22 0619  Weight: 51.6 kg    Exam: General: NAD  Cardiovascular: S1, S2 present Respiratory: Diminished BS b/l Abdomen: Soft, nontender, nondistended, bowel  sounds present Musculoskeletal: No bilateral pedal edema noted, noted R swelling/hematoma on R forearm Skin: As mentioned above Psychiatry: Normal mood     Data Reviewed: CBC: Recent Labs  Lab 10/27/22 1449 10/28/22 0029  WBC 9.1 8.0  HGB 8.3* 8.5*  HCT 27.7* 28.0*  MCV 91.4 89.5  PLT 290 282   Basic Metabolic Panel: Recent Labs  Lab 10/27/22 1449 10/28/22 0029  NA 138 137  K 3.5 3.5  CL 107 103  CO2 23 23  GLUCOSE  95 95  BUN 20 19  CREATININE 1.17* 1.36*  CALCIUM 8.6* 8.6*   GFR: Estimated Creatinine Clearance: 23.2 mL/min (A) (by C-G formula based on SCr of 1.36 mg/dL (H)). Liver Function Tests: Recent Labs  Lab 10/28/22 0029  AST 32  ALT 19  ALKPHOS 68  BILITOT 1.1  PROT 7.3  ALBUMIN 3.0*   No results for input(s): "LIPASE", "AMYLASE" in the last 168 hours. No results for input(s): "AMMONIA" in the last 168 hours. Coagulation Profile: No results for input(s): "INR", "PROTIME" in the last 168 hours. Cardiac Enzymes: No results for input(s): "CKTOTAL", "CKMB", "CKMBINDEX", "TROPONINI" in the last 168 hours. BNP (last 3 results) No results for input(s): "PROBNP" in the last 8760 hours. HbA1C: No results for input(s): "HGBA1C" in the last 72 hours. CBG: No results for input(s): "GLUCAP" in the last 168 hours. Lipid Profile: No results for input(s): "CHOL", "HDL", "LDLCALC", "TRIG", "CHOLHDL", "LDLDIRECT" in the last 72 hours. Thyroid Function Tests: Recent Labs    10/28/22 0029 10/28/22 0823  TSH 41.541*  --   FREET4  --  0.57*   Anemia Panel: Recent Labs    10/28/22 0029 10/28/22 0823  VITAMINB12 642  --   FOLATE 17.5  --   FERRITIN  --  15  TIBC  --  494*  IRON  --  32  RETICCTPCT 2.1  --    Urine analysis:    Component Value Date/Time   COLORURINE YELLOW 05/01/2022 1922   APPEARANCEUR HAZY (A) 05/01/2022 1922   LABSPEC 1.029 05/01/2022 1922   PHURINE 5.0 05/01/2022 1922   GLUCOSEU 50 (A) 05/01/2022 1922   HGBUR SMALL (A) 05/01/2022 1922   BILIRUBINUR NEGATIVE 05/01/2022 1922   KETONESUR NEGATIVE 05/01/2022 1922   PROTEINUR 100 (A) 05/01/2022 1922   NITRITE NEGATIVE 05/01/2022 1922   LEUKOCYTESUR NEGATIVE 05/01/2022 1922   Sepsis Labs: @LABRCNTIP (procalcitonin:4,lacticidven:4)  ) Recent Results (from the past 240 hour(s))  SARS Coronavirus 2 by RT PCR (hospital order, performed in [facility] hospital lab) *cepheid single result test* Anterior Nasal Swab      Status: None   Collection Time: 10/27/22  6:09 PM   Specimen: Anterior Nasal Swab  Result Value Ref Range Status   SARS Coronavirus 2 by RT PCR NEGATIVE NEGATIVE Final    Comment: Performed at [facility], [address], [place], [place] [address]      Studies: US THORACENTESIS ASP PLEURAL SPACE W/IMG GUIDE  Result Date: 10/28/2022 INDICATION: Shortness of breath, right pleural effusion. Request for therapeutic thoracentesis. EXAM: ULTRASOUND GUIDED RIGHT THORACENTESIS MEDICATIONS: 10 mL 1% lidocaine COMPLICATIONS: None immediate. PROCEDURE: An ultrasound guided thoracentesis was thoroughly discussed with the patient and questions answered. The benefits, risks, alternatives and complications were also discussed. The patient understands and wishes to proceed with the procedure. Written consent was obtained. Ultrasound was performed to localize and mark an adequate pocket of fluid in the right chest. The area was then prepped and draped in the normal  sterile fashion. 1% Lidocaine was used for local anesthesia. Under ultrasound guidance a 6 Fr Safe-T-Centesis catheter was introduced. Thoracentesis was performed. The catheter was removed and a dressing applied. FINDINGS: A total of approximately 1.0 liters of yellow fluid was removed. IMPRESSION: Successful ultrasound guided right thoracentesis yielding 1.0 of pleural fluid. Performed by: Loyce Dys PA-C Electronically Signed   By: Malachy Moan M.D.   On: 10/28/2022 12:23   DG Chest 1 View  Result Date: 10/28/2022 CLINICAL DATA:  Status post right thoracentesis. EXAM: CHEST  1 VIEW COMPARISON:  10/27/2022 FINDINGS: Lungs are hyperexpanded. Similar biapical pleuroparenchymal scarring. Interstitial markings are diffusely coarsened with chronic features. Interval decrease in right pleural effusion without evidence for pneumothorax after thoracentesis. The cardio pericardial silhouette is enlarged. Bones are diffusely demineralized.  Telemetry leads overlie the chest. IMPRESSION: Interval decrease in right pleural effusion without evidence for pneumothorax after thoracentesis. Electronically Signed   By: Kennith Center M.D.   On: 10/28/2022 10:32   DG Chest 2 View  Result Date: 10/27/2022 CLINICAL DATA:  Shortness of breath for 3 days. EXAM: CHEST - 2 VIEW COMPARISON:  Radiographs 09/26/2022, 09/07/2022 and 09/05/2022. Chest CT 03/30/2022. FINDINGS: Stable cardiomegaly and aortic atherosclerosis. There are enlarging right greater than left pleural effusions with associated dependent opacities at both lung bases, likely reflecting atelectasis. Vascular congestion without overt pulmonary edema. No evidence of pneumothorax. There is underlying chronic obstructive pulmonary disease with biapical scarring. The bones appear unchanged. IMPRESSION: 1. Enlarging right greater than left pleural effusions with associated bibasilar atelectasis. No overt pulmonary edema. 2. Chronic obstructive pulmonary disease. Electronically Signed   By: Carey Bullocks M.D.   On: 10/27/2022 15:29    Scheduled Meds:  amiodarone  200 mg Oral Daily   apixaban  2.5 mg Oral BID   atorvastatin  40 mg Oral Daily   furosemide  40 mg Intravenous BID   pantoprazole  40 mg Oral BID    Continuous Infusions:   LOS: 1 day     Briant Cedar, MD [facility]  If 7PM-7AM, please contact night-coverage [email or web address] 10/28/2022, 1:39 PM

## 2022-10-29 DIAGNOSIS — I5041 Acute combined systolic (congestive) and diastolic (congestive) heart failure: Secondary | ICD-10-CM | POA: Diagnosis not present

## 2022-10-29 LAB — BASIC METABOLIC PANEL
Anion gap: 11 (ref 5–15)
BUN: 23 mg/dL (ref 8–23)
CO2: 25 mmol/L (ref 22–32)
Calcium: 8.4 mg/dL — ABNORMAL LOW (ref 8.9–10.3)
Chloride: 95 mmol/L — ABNORMAL LOW (ref 98–111)
Creatinine, Ser: 1.76 mg/dL — ABNORMAL HIGH (ref 0.44–1.00)
GFR, Estimated: 29 mL/min — ABNORMAL LOW (ref >60)
Glucose, Bld: 99 mg/dL (ref 70–99)
Potassium: 3.3 mmol/L — ABNORMAL LOW (ref 3.5–5.1)
Sodium: 131 mmol/L — ABNORMAL LOW (ref 135–145)

## 2022-10-29 MED ORDER — POTASSIUM CHLORIDE CRYS ER 20 MEQ PO TBCR: 40.0000 meq | EXTENDED_RELEASE_TABLET | Freq: Once | ORAL | Status: DC

## 2022-10-29 MED ORDER — IPRATROPIUM-ALBUTEROL 0.5-2.5 (3) MG/3ML IN SOLN
3.0000 mL | Freq: Four times a day (QID) | RESPIRATORY_TRACT | Status: DC
  Administered 2022-10-29 – 2022-10-30 (×2): 3 mL via RESPIRATORY_TRACT
  Filled 2022-10-29 (×2): qty 3

## 2022-10-29 MED ORDER — MOMETASONE FURO-FORMOTEROL FUM 100-5 MCG/ACT IN AERO
2.0000 | INHALATION_SPRAY | Freq: Two times a day (BID) | RESPIRATORY_TRACT | Status: DC
  Administered 2022-10-29: 2 via RESPIRATORY_TRACT
  Filled 2022-10-29: qty 8.8

## 2022-10-29 MED ORDER — POTASSIUM CHLORIDE 20 MEQ PO PACK
40.0000 meq | PACK | Freq: Once | ORAL | Status: AC
  Administered 2022-10-29: 40 meq via ORAL
  Filled 2022-10-29: qty 2

## 2022-10-29 MED ORDER — IPRATROPIUM-ALBUTEROL 0.5-2.5 (3) MG/3ML IN SOLN: 3.0000 mL | Freq: Four times a day (QID) | RESPIRATORY_TRACT | Status: DC

## 2022-10-29 NOTE — Progress Notes (Addendum)
PROGRESS NOTE  Laurie James HYQ:[id number] DOB: 1945-05-23 DOA: 10/27/2022 PCP: Georgann Housekeeper, MD  HPI/Recap of past 24 hours: Laurie James is a 77 y.o. female with medical history significant of pulmonary hypertension ,severe COPD not on home O2, blindness, chronic debility, systolic HF due to NICM (diagnosed 10/2020), recurrent right sided pleural effusion s/p thoracentesis 5/30 as well as atrial fibrillation. Patient presents to ED with 3 days of increasing sob/doe, weakness, worsening swelling in BLE. CXR showed enlarging right greater than left pleural effusions with associated bibasilar atelectasis, no overt pulmonary edema. BNP 1045.  Patient admitted for further management.    Today, pt denies any new complaints. Desaturated to 89% while ambulating on RA. Denies any chest pain    Assessment/Plan: Principal Problem:   Acute CHF (congestive heart failure) (HCC)   Acute on chronic systolic and diastolic heart failure /NICM BNP 1,045 ECHO on 05/2022 showed EF of 50 to 55%, no regional wall motion abnormality, severe tricuspid valve regurgitation, repeat pending  Held IV lasix 40 mg daily Off Entresto, spiro and Jardiance due to history of recurrent AKI and hyperkalemia; failed x 2 per cards note Strict I/O, daily weight  Hypokalemia Replace prn   Recurrent right sided pleural effusion Presumed due to possible cardiac cirrhosis per cards S/p thoracentesis on 10/28/22, yielding 1 liters of yellow fluid, no specimen was sent out Supplemental O2 as needed   Paroxysmal Atrial Fibrillation Continue on amiodarone Continue on Eliquis   Anemia of CKD Iron def anemia  Hemoglobin between 8-9 Anemia panel showed sats 7%, iron 32, ferritin 15 Daily CBC   Pulmonary Hypertension  Severe TR on Echo Due to advanced COPD not candidate for pulmonary vasodilators   AKI on CKD stage IIIa Rise in Cr, lasix held Daily BMP   COPD not on home O2 Continue  albuterol  Supplemental O2 as needed  Hypothyroidism TSH 41, free T4-->0.57 Reports synthroid causes her to be SOB (not taking any thyroid meds PTA)  R forearm hematoma Improving as per patient Reports she struck her forearm while trying to get into her daughters truck Monitor   Chronic debility pt /ot     GERD ppi    Estimated body mass index is 23.82 kg/m as calculated from the following:   Height as of this encounter: 4\' 8"  (1.422 m).   Weight as of this encounter: 48.2 kg.     Code Status: Full  Family Communication: None at bedside  Disposition Plan: Status is: Inpatient Remains inpatient appropriate because: Level of care      Consultants: None  Procedures: Thoracentesis on 7/20  Antimicrobials: None  DVT prophylaxis:  Eliquis   Objective: Vitals:   10/29/22 0449 10/29/22 0742 10/29/22 1120 10/29/22 1203  BP: (!) 111/54 106/63  (!) 104/57  Pulse: 65 79 (!) 113 84  Resp: 13 20 20 19   Temp: 97.7 F (36.5 C) 97.9 F (36.6 C)  (!) 97.4 F (36.3 C)  TempSrc: Oral Oral  Oral  SpO2: 99% 100% 99% 100%  Weight: 48.2 kg     Height:        Intake/Output Summary (Last 24 hours) at 10/29/2022 1408 Last data filed at 10/29/2022 0449 Gross per 24 hour  Intake 240 ml  Output 150 ml  Net 90 ml   Filed Weights   10/28/22 0619 10/29/22 0449  Weight: 51.6 kg 48.2 kg    Exam: General: NAD  Cardiovascular: S1, S2 present Respiratory: Diminished BS b/l Abdomen: Soft,  nontender, nondistended, bowel sounds present Musculoskeletal: No bilateral pedal edema noted, noted R swelling/hematoma on R forearm Skin: As mentioned above Psychiatry: Normal mood     Data Reviewed: CBC: Recent Labs  Lab 10/27/22 1449 10/28/22 0029  WBC 9.1 8.0  HGB 8.3* 8.5*  HCT 27.7* 28.0*  MCV 91.4 89.5  PLT 290 282   Basic Metabolic Panel: Recent Labs  Lab 10/27/22 1449 10/28/22 0029 10/29/22 0751  NA 138 137 131*  K 3.5 3.5 3.3*  CL 107 103 95*  CO2 23 23  25   GLUCOSE 95 95 99  BUN 20 19 23   CREATININE 1.17* 1.36* 1.76*  CALCIUM 8.6* 8.6* 8.4*   GFR: Estimated Creatinine Clearance: 17.4 mL/min (A) (by C-G formula based on SCr of 1.76 mg/dL (H)). Liver Function Tests: Recent Labs  Lab 10/28/22 0029  AST 32  ALT 19  ALKPHOS 68  BILITOT 1.1  PROT 7.3  ALBUMIN 3.0*   No results for input(s): "LIPASE", "AMYLASE" in the last 168 hours. No results for input(s): "AMMONIA" in the last 168 hours. Coagulation Profile: No results for input(s): "INR", "PROTIME" in the last 168 hours. Cardiac Enzymes: No results for input(s): "CKTOTAL", "CKMB", "CKMBINDEX", "TROPONINI" in the last 168 hours. BNP (last 3 results) No results for input(s): "PROBNP" in the last 8760 hours. HbA1C: No results for input(s): "HGBA1C" in the last 72 hours. CBG: No results for input(s): "GLUCAP" in the last 168 hours. Lipid Profile: No results for input(s): "CHOL", "HDL", "LDLCALC", "TRIG", "CHOLHDL", "LDLDIRECT" in the last 72 hours. Thyroid Function Tests: Recent Labs    10/28/22 0029 10/28/22 0823  TSH 41.541*  --   FREET4  --  0.57*   Anemia Panel: Recent Labs    10/28/22 0029 10/28/22 0823  VITAMINB12 642  --   FOLATE 17.5  --   FERRITIN  --  15  TIBC  --  494*  IRON  --  32  RETICCTPCT 2.1  --    Urine analysis:    Component Value Date/Time   COLORURINE YELLOW 05/01/2022 1922   APPEARANCEUR HAZY (A) 05/01/2022 1922   LABSPEC 1.029 05/01/2022 1922   PHURINE 5.0 05/01/2022 1922   GLUCOSEU 50 (A) 05/01/2022 1922   HGBUR SMALL (A) 05/01/2022 1922   BILIRUBINUR NEGATIVE 05/01/2022 1922   KETONESUR NEGATIVE 05/01/2022 1922   PROTEINUR 100 (A) 05/01/2022 1922   NITRITE NEGATIVE 05/01/2022 1922   LEUKOCYTESUR NEGATIVE 05/01/2022 1922   Sepsis Labs: @LABRCNTIP (procalcitonin:4,lacticidven:4)  ) Recent Results (from the past 240 hour(s))  SARS Coronavirus 2 by RT PCR (hospital order, performed in [facility] hospital lab) *cepheid single  result test* Anterior Nasal Swab     Status: None   Collection Time: 10/27/22  6:09 PM   Specimen: Anterior Nasal Swab  Result Value Ref Range Status   SARS Coronavirus 2 by RT PCR NEGATIVE NEGATIVE Final    Comment: Performed at [facility], [address] [place], [place] [address]      Studies: No results found.  Scheduled Meds:  amiodarone  200 mg Oral Daily   apixaban  2.5 mg Oral BID   atorvastatin  40 mg Oral Daily   ipratropium-albuterol  3 mL Nebulization Q6H   mometasone-formoterol  2 puff Inhalation BID   pantoprazole  40 mg Oral BID    Continuous Infusions:   LOS: 2 days     Briant Cedar, MD [facility]  If 7PM-7AM, please contact night-coverage [email or web address] 10/29/2022, 2:08 PM

## 2022-10-29 NOTE — Plan of Care (Signed)
Alert and oriented. Ambulated with assistance in hallways. Oxygen saturation checked while ambulating. Did not desaturate while ambulating on room air.   Problem: Education: Goal: Ability to demonstrate management of disease process will improve Outcome: Progressing Goal: Ability to verbalize understanding of medication therapies will improve Outcome: Progressing   Problem: Activity: Goal: Capacity to carry out activities will improve Outcome: Progressing   Problem: Cardiac: Goal: Ability to achieve and maintain adequate cardiopulmonary perfusion will improve Outcome: Progressing   Problem: Education: Goal: Knowledge of General Education information will improve Description: Including pain rating scale, medication(s)/side effects and non-pharmacologic comfort measures Outcome: Progressing   Problem: Health Behavior/Discharge Planning: Goal: Ability to manage health-related needs will improve Outcome: Progressing   Problem: Clinical Measurements: Goal: Ability to maintain clinical measurements within normal limits will improve Outcome: Progressing Goal: Will remain free from infection Outcome: Progressing Goal: Diagnostic test results will improve Outcome: Progressing Goal: Respiratory complications will improve Outcome: Progressing

## 2022-10-29 NOTE — Progress Notes (Signed)
SaO2 on room air at rest = 100% SaO2 on room air while ambulating = 89% After ambulating, patient oxygen SaO2 improved to 94% on room air. Did not require oxygen during ambulation.

## 2022-10-30 ENCOUNTER — Other Ambulatory Visit (HOSPITAL_COMMUNITY): Payer: Self-pay

## 2022-10-30 DIAGNOSIS — I509 Heart failure, unspecified: Secondary | ICD-10-CM | POA: Diagnosis not present

## 2022-10-30 LAB — BASIC METABOLIC PANEL
Anion gap: 9 (ref 5–15)
BUN: 23 mg/dL (ref 8–23)
CO2: 25 mmol/L (ref 22–32)
Calcium: 8.4 mg/dL — ABNORMAL LOW (ref 8.9–10.3)
Chloride: 98 mmol/L (ref 98–111)
Creatinine, Ser: 1.64 mg/dL — ABNORMAL HIGH (ref 0.44–1.00)
GFR, Estimated: 32 mL/min — ABNORMAL LOW (ref >60)
Glucose, Bld: 95 mg/dL (ref 70–99)
Potassium: 4.3 mmol/L (ref 3.5–5.1)
Sodium: 132 mmol/L — ABNORMAL LOW (ref 135–145)

## 2022-10-30 MED ORDER — BUDESONIDE-FORMOTEROL FUMARATE 80-4.5 MCG/ACT IN AERO: 2.0000 | INHALATION_SPRAY | Freq: Two times a day (BID) | RESPIRATORY_TRACT | 0 refills | Status: DC

## 2022-10-30 MED ORDER — IPRATROPIUM-ALBUTEROL 0.5-2.5 (3) MG/3ML IN SOLN: 3.0000 mL | Freq: Two times a day (BID) | RESPIRATORY_TRACT | Status: DC

## 2022-10-30 MED ORDER — MOMETASONE FURO-FORMOTEROL FUM 100-5 MCG/ACT IN AERO: 2.0000 | INHALATION_SPRAY | Freq: Two times a day (BID) | RESPIRATORY_TRACT | 0 refills | Status: DC

## 2022-10-30 NOTE — Discharge Summary (Signed)
Physician Discharge Summary   Patient: Laurie James MRN: [id number] DOB: 26-Jan-1946  Admit date:     10/27/2022  Discharge date: 10/30/22  Discharge Physician: Briant Cedar   PCP: Georgann Housekeeper, MD   Recommendations at discharge:   Follow-up with PCP in 1 week Follow-up with cardiology as scheduled  Discharge Diagnoses: Principal Problem:   Acute CHF (congestive heart failure) [facility])    Hospital Course: [name] [name] is a 77 y.o. female with medical history significant of pulmonary hypertension ,severe COPD not on home O2, blindness, chronic debility, systolic HF due to NICM (diagnosed 10/2020), recurrent right sided pleural effusion s/p thoracentesis 5/30 as well as atrial fibrillation. Patient presents to ED with 3 days of increasing sob/doe, weakness, worsening swelling in BLE. CXR showed enlarging right greater than left pleural effusions with associated bibasilar atelectasis, no overt pulmonary edema. BNP 1045.  Patient admitted for further management.     Today, patient denies any new complaints, denies any worsening SOB, chest pain, fever/chills.  Noted some tachycardia shortly after DuoNeb treatment.  Discussed in details about discharge plans, daughter at bedside, verbalized understanding.  Very eager to be discharged    Assessment and Plan:  Acute on chronic systolic and diastolic heart failure /NICM BNP 1,045 ECHO showed EF of 55 to 60%, no regional wall motion abnormality, Grade 2 DD, severe tricuspid valve regurgitation S/p IV lasix 40 mg daily--> held as Cr bumped up Off Entresto, spiro and Jardiance due to history of recurrent AKI and hyperkalemia; failed x 2 per cards note Outpatient follow up   Hypokalemia Replaced prn   Recurrent right sided pleural effusion Presumed due to possible cardiac cirrhosis per cards S/p thoracentesis on 10/28/22, yielding 1 liters of yellow fluid, no specimen was sent out   Paroxysmal Atrial  Fibrillation Continue on amiodarone Continue on Eliquis   Anemia of CKD Iron def anemia  Hemoglobin between 8-9 Anemia panel showed sats 7%, iron 32, ferritin 15 Outpt follow up   Pulmonary Hypertension  Severe TR on Echo Due to advanced COPD not candidate for pulmonary vasodilators   AKI on CKD stage IIIa Rise in Cr, although trending down Outpatient follow up   COPD not on home O2 Started on symbicort for maintenance Continue albuterol prn Patient will need referral to outpatient pulmonology for COPD management   Hypothyroidism TSH 41, free T4-->0.57 Reports synthroid causes her to be SOB (not taking any thyroid meds PTA) Refuses to take it PCP to follow up   R forearm hematoma Improved as per patient Reports she struck her forearm while trying to get into her grand-daughters truck Outpt follow up   Chronic debility   GERD ppi    Consultants: None Procedures performed: None Disposition: Home Diet recommendation:  Cardiac diet   DISCHARGE MEDICATION: Allergies as of 10/30/2022       Reactions   Codeine Nausea And Vomiting, Other (See Comments)   Upsets the stomach        Medication List     TAKE these medications    albuterol 108 (90 Base) MCG/ACT inhaler Commonly known as: VENTOLIN HFA Inhale 2 puffs into the lungs every 4 (four) hours as needed for wheezing.   amiodarone 200 MG tablet Commonly known as: PACERONE Take 1 tablet (200 mg total) by mouth daily.   apixaban 2.5 MG Tabs tablet Commonly known as: ELIQUIS Take 1 tablet (2.5 mg total) by mouth 2 (two) times daily.   atorvastatin 40 MG tablet Commonly  known as: LIPITOR Take 1 tablet by mouth once daily   budesonide-formoterol 80-4.5 MCG/ACT inhaler Commonly known as: Symbicort Inhale 2 puffs into the lungs in the morning and at bedtime.   cyanocobalamin 1000 MCG/ML injection Commonly known as: VITAMIN B12 Inject 1,000 mcg into the muscle every 30 (thirty) days.   furosemide  20 MG tablet Commonly known as: LASIX Take 1 tablet (20 mg total) by mouth as needed. Leg edema What changed:  when to take this reasons to take this additional instructions   nitroGLYCERIN 0.4 MG SL tablet Commonly known as: NITROSTAT Place 1 tablet (0.4 mg total) under the tongue every 5 (five) minutes x 3 doses as needed for chest pain.   ondansetron 4 MG tablet Commonly known as: ZOFRAN Take 4 mg by mouth 3 (three) times daily as needed for nausea or vomiting.   pantoprazole 40 MG tablet Commonly known as: PROTONIX Take 1 tablet (40 mg total) by mouth 2 (two) times daily.        Follow-up Information     [facility] Follow up on 11/10/2022.   Specialty: Cardiology Why: Please follow up in the Advanced Heart Failure Clinic 11/10/22 at 2:30  Entrance C, free valet Contact information: [address] [place] [address] [phone]        Georgann Housekeeper, MD. Go on 11/06/2022.   Specialty: Internal Medicine Why: @9 :15am Contact information: [address] [place] [address] [phone]                Discharge Exam: Ceasar Mons Weights   10/28/22 5784 10/29/22 0449 10/30/22 0438  Weight: 51.6 kg 48.2 kg 47.9 kg   General: NAD  Cardiovascular: S1, S2 present Respiratory: Diminished BS b/l Abdomen: Soft, nontender, nondistended, bowel sounds present Musculoskeletal: No bilateral pedal edema noted, noted R swelling/hematoma on R forearm  Skin: As mentioned above Psychiatry: Normal mood   Condition at discharge: stable  The results of significant diagnostics from this hospitalization (including imaging, microbiology, ancillary and laboratory) are listed below for reference.   Imaging Studies: ECHOCARDIOGRAM COMPLETE  Result Date: 10/28/2022    ECHOCARDIOGRAM REPORT   Patient Name:   Laurie James Laurie James Date of Exam: 10/28/2022 Medical Rec #:  [id number]                    Height:       56.0 in Accession #:    [id number]                  Weight:       113.8 lb Date of Birth:  06/21/1945                   BSA:          1.397 m Patient Age:    77 years                    BP:           120/63 mmHg Patient Gender: F                           HR:           73 bpm. Exam Location:  Inpatient Procedure: 2D Echo, Cardiac Doppler, Color Doppler and Intracardiac            Opacification Agent Indications:    CHF  I50.21  History:        Patient has prior history of Echocardiogram examinations, most                 recent 05/31/2022. CHF, CAD, CKD; Risk Factors:Sleep Apnea,                 Dyslipidemia and Former Smoker.  Sonographer:    Dondra Prader RVT RCS Referring Phys: [id number] SARA-MAIZ A THOMAS IMPRESSIONS  1. Left ventricular ejection fraction, by estimation, is 55 to 60%. The left ventricle has normal function. The left ventricle has no regional wall motion abnormalities. Left ventricular diastolic parameters are consistent with Grade II diastolic dysfunction (pseudonormalization).  2. Right ventricular systolic function is normal. The right ventricular size is mildly enlarged.  3. Left atrial size was severely dilated.  4. Right atrial size was severely dilated.  5. The mitral valve is normal in structure. Trivial mitral valve regurgitation. No evidence of mitral stenosis.  6. Tricuspid valve regurgitation is severe.  7. The aortic valve is tricuspid. Aortic valve regurgitation is trivial. Aortic valve sclerosis is present, with no evidence of aortic valve stenosis.  8. The inferior vena cava is normal in size with greater than 50% respiratory variability, suggesting right atrial pressure of 3 mmHg. FINDINGS  Left Ventricle: Left ventricular ejection fraction, by estimation, is 55 to 60%. The left ventricle has normal function. The left ventricle has no regional wall motion abnormalities. Definity contrast agent was given IV to delineate the left ventricular  endocardial borders. The left  ventricular internal cavity size was normal in size. There is no left ventricular hypertrophy. Left ventricular diastolic parameters are consistent with Grade II diastolic dysfunction (pseudonormalization). Right Ventricle: The right ventricular size is mildly enlarged. Right ventricular systolic function is normal. Left Atrium: Left atrial size was severely dilated. Right Atrium: Right atrial size was severely dilated. Pericardium: There is no evidence of pericardial effusion. Mitral Valve: The mitral valve is normal in structure. Trivial mitral valve regurgitation. No evidence of mitral valve stenosis. Tricuspid Valve: The tricuspid valve is normal in structure. Tricuspid valve regurgitation is severe. No evidence of tricuspid stenosis. Aortic Valve: The aortic valve is tricuspid. Aortic valve regurgitation is trivial. Aortic valve sclerosis is present, with no evidence of aortic valve stenosis. Aortic valve mean gradient measures 2.0 mmHg. Aortic valve peak gradient measures 3.1 mmHg. Aortic valve area, by VTI measures 2.59 cm. Pulmonic Valve: The pulmonic valve was normal in structure. Pulmonic valve regurgitation is mild. No evidence of pulmonic stenosis. Aorta: The aortic root is normal in size and structure. Venous: The inferior vena cava is normal in size with greater than 50% respiratory variability, suggesting right atrial pressure of 3 mmHg. IAS/Shunts: No atrial level shunt detected by color flow Doppler.  LEFT VENTRICLE PLAX 2D LVIDd:         4.50 cm   Diastology LVIDs:         3.10 cm   LV e' medial:    6.20 cm/s LV PW:         0.70 cm   LV E/e' medial:  7.1 LV IVS:        0.60 cm   LV e' lateral:   7.40 cm/s LVOT diam:     2.00 cm   LV E/e' lateral: 5.9 LV SV:         41 LV SV Index:   30 LVOT Area:     3.14 cm  RIGHT VENTRICLE  IVC RV Basal diam:  4.70 cm     IVC diam: 1.40 cm RV Mid diam:    3.50 cm RV S prime:     12.10 cm/s TAPSE (M-mode): 1.5 cm LEFT ATRIUM             Index         RIGHT ATRIUM           Index LA diam:        3.20 cm 2.29 cm/m   RA Area:     24.00 cm LA Vol (A2C):   74.1 ml 53.05 ml/m  RA Volume:   81.10 ml  58.07 ml/m LA Vol (A4C):   45.5 ml 32.58 ml/m LA Biplane Vol: 71.6 ml 51.26 ml/m  AORTIC VALVE                    PULMONIC VALVE AV Area (Vmax):    2.77 cm     PV Vmax:          1.04 m/s AV Area (Vmean):   2.66 cm     PV Peak grad:     4.3 mmHg AV Area (VTI):     2.59 cm     PR End Diast Vel: 7.62 msec AV Vmax:           87.90 cm/s AV Vmean:          55.200 cm/s AV VTI:            0.160 m AV Peak Grad:      3.1 mmHg AV Mean Grad:      2.0 mmHg LVOT Vmax:         77.60 cm/s LVOT Vmean:        46.800 cm/s LVOT VTI:          0.132 m LVOT/AV VTI ratio: 0.82 AR Vena Contracta: 0.30 cm  AORTA Ao Root diam: 3.00 cm Ao Asc diam:  2.90 cm MITRAL VALVE               TRICUSPID VALVE MV Area (PHT): 2.73 cm    TR Peak grad:   31.6 mmHg MV Decel Time: 278 msec    TR Vmax:        281.00 cm/s MV E velocity: 43.90 cm/s MV A velocity: 32.80 cm/s  SHUNTS MV E/A ratio:  1.34        Systemic VTI:  0.13 m                            Systemic Diam: 2.00 cm [name] MD Electronically signed by [name] MD Signature Date/Time: 10/28/2022/4:11:54 PM    Final    US THORACENTESIS ASP PLEURAL SPACE W/IMG GUIDE  Result Date: 10/28/2022 INDICATION: Shortness of breath, right pleural effusion. Request for therapeutic thoracentesis. EXAM: ULTRASOUND GUIDED RIGHT THORACENTESIS MEDICATIONS: 10 mL 1% lidocaine COMPLICATIONS: None immediate. PROCEDURE: An ultrasound guided thoracentesis was thoroughly discussed with the patient and questions answered. The benefits, risks, alternatives and complications were also discussed. The patient understands and wishes to proceed with the procedure. Written consent was obtained. Ultrasound was performed to localize and mark an adequate pocket of fluid in the right chest. The area was then prepped and draped in the normal sterile fashion. 1%  Lidocaine was used for local anesthesia. Under ultrasound guidance a 6 Fr Safe-T-Centesis catheter was introduced. Thoracentesis was performed. The catheter was removed and a dressing applied. FINDINGS:  A total of approximately 1.0 liters of yellow fluid was removed. IMPRESSION: Successful ultrasound guided right thoracentesis yielding 1.0 of pleural fluid. Performed by: Loyce Dys PA-C Electronically Signed   By: Malachy Moan M.D.   On: 10/28/2022 12:23   DG Chest 1 View  Result Date: 10/28/2022 CLINICAL DATA:  Status post right thoracentesis. EXAM: CHEST  1 VIEW COMPARISON:  10/27/2022 FINDINGS: Lungs are hyperexpanded. Similar biapical pleuroparenchymal scarring. Interstitial markings are diffusely coarsened with chronic features. Interval decrease in right pleural effusion without evidence for pneumothorax after thoracentesis. The cardio pericardial silhouette is enlarged. Bones are diffusely demineralized. Telemetry leads overlie the chest. IMPRESSION: Interval decrease in right pleural effusion without evidence for pneumothorax after thoracentesis. Electronically Signed   By: Kennith Center M.D.   On: 10/28/2022 10:32   DG Chest 2 View  Result Date: 10/27/2022 CLINICAL DATA:  Shortness of breath for 3 days. EXAM: CHEST - 2 VIEW COMPARISON:  Radiographs 09/26/2022, 09/07/2022 and 09/05/2022. Chest CT 03/30/2022. FINDINGS: Stable cardiomegaly and aortic atherosclerosis. There are enlarging right greater than left pleural effusions with associated dependent opacities at both lung bases, likely reflecting atelectasis. Vascular congestion without overt pulmonary edema. No evidence of pneumothorax. There is underlying chronic obstructive pulmonary disease with biapical scarring. The bones appear unchanged. IMPRESSION: 1. Enlarging right greater than left pleural effusions with associated bibasilar atelectasis. No overt pulmonary edema. 2. Chronic obstructive pulmonary disease. Electronically Signed    By: Carey Bullocks M.D.   On: 10/27/2022 15:29    Microbiology: Results for orders placed or performed during the hospital encounter of 10/27/22  SARS Coronavirus 2 by RT PCR (hospital order, performed in [facility] hospital lab) *cepheid single result test* Anterior Nasal Swab     Status: None   Collection Time: 10/27/22  6:09 PM   Specimen: Anterior Nasal Swab  Result Value Ref Range Status   SARS Coronavirus 2 by RT PCR NEGATIVE NEGATIVE Final    Comment: Performed at [facility], [address], [place], [place] [address]    Labs: CBC: Recent Labs  Lab 10/27/22 1449 10/28/22 0029  WBC 9.1 8.0  HGB 8.3* 8.5*  HCT 27.7* 28.0*  MCV 91.4 89.5  PLT 290 282   Basic Metabolic Panel: Recent Labs  Lab 10/27/22 1449 10/28/22 0029 10/29/22 0751 10/30/22 0050  NA 138 137 131* 132*  K 3.5 3.5 3.3* 4.3  CL 107 103 95* 98  CO2 23 23 25 25   GLUCOSE 95 95 99 95  BUN 20 19 23 23   CREATININE 1.17* 1.36* 1.76* 1.64*  CALCIUM 8.6* 8.6* 8.4* 8.4*   Liver Function Tests: Recent Labs  Lab 10/28/22 0029  AST 32  ALT 19  ALKPHOS 68  BILITOT 1.1  PROT 7.3  ALBUMIN 3.0*   CBG: No results for input(s): "GLUCAP" in the last 168 hours.  Discharge time spent: greater than 30 minutes.  Signed: Briant Cedar, MD [facility] 10/30/2022

## 2022-10-30 NOTE — Care Management Important Message (Signed)
Important Message  Patient Details  Name: Laurie James MRN: [id number] Date of Birth: Jun 08, 1945   Medicare Important Message Given:  Yes  Patient left prior to IM delivery will mail a copy to the home address.    Vong Garringer 10/30/2022, 4:33 PM

## 2022-10-30 NOTE — Care Management Important Message (Signed)
Important Message  Patient Details  Name: Laurie James MRN: [id number] Date of Birth: Oct 02, 1945   Medicare Important Message Given:  Yes     Dorena Bodo 10/30/2022, 4:32 PM

## 2022-10-30 NOTE — TOC Initial Note (Addendum)
Transition of Care [facility]) - Initial/Assessment Note    Patient Details  Name: Laurie James MRN: [id number] Date of Birth: 1945/08/30  Transition of Care [facility]) CM/SW Contact:    Leone Haven, RN Phone Number: 10/30/2022, 12:03 PM  Clinical Narrative:                 From home with spouse, has PCP, Dr. Donette Larry, has insurance, she states she currently does not have any HH services in place at this time.  She states she does not want a HHRN or HHPT, she states she does pretty well getting around.  She does have a hospital bed at home.  She states she self ambulates. Sampson Si , her daughter is at the bedside and will transport her home today, Tresa Endo and patient's spouse is her support system.  She states she gets her medications from [facility] in [place].   Expected Discharge Plan: Home/Self Care Barriers to Discharge: No Barriers Identified   Patient Goals and CMS Choice Patient states their goals for this hospitalization and ongoing recovery are:: return home with spouse   Choice offered to / list presented to : NA      Expected Discharge Plan and Services In-house Referral: NA Discharge Planning Services: CM Consult Post Acute Care Choice: NA   Expected Discharge Date: 10/30/22                 DME Agency: NA       HH Arranged: NA          Prior Living Arrangements/Services   Lives with:: Spouse Patient language and need for interpreter reviewed:: Yes Do you feel safe going back to the place where you live?: Yes      Need for Family Participation in Patient Care: Yes (Comment) Care giver support system in place?: Yes (comment) Current home services: DME (hospital bed,) Criminal Activity/Legal Involvement Pertinent to Current Situation/Hospitalization: No - Comment as needed  Activities of Daily Living      Permission Sought/Granted Permission sought to share information with : Case Manager Permission granted to share information with :  Yes, Verbal Permission Granted              Emotional Assessment Appearance:: Appears stated age Attitude/Demeanor/Rapport: Engaged Affect (typically observed): Appropriate Orientation: : Oriented to Self, Oriented to Place, Oriented to  Time, Oriented to Situation Alcohol / Substance Use: Not Applicable Psych Involvement: No (comment)  Admission diagnosis:  Pleural effusion [J90] COPD exacerbation (HCC) [J44.1] Acute CHF (congestive heart failure) (HCC) [I50.9] Patient Active Problem List   Diagnosis Date Noted   Acute CHF (congestive heart failure) (HCC) 10/27/2022   CKD stage 3b, GFR 30-44 ml/min (HCC) 09/08/2022   Heart failure (HCC) 09/06/2022   Acute on chronic diastolic CHF (congestive heart failure) (HCC) 09/05/2022   GERD without esophagitis 05/02/2022   Gout 05/02/2022   Left lower quadrant abdominal pain 05/02/2022   Elevated LFTs 05/02/2022   Hyponatremia 05/02/2022   Recurrent right pleural effusion 05/01/2022   PAF (paroxysmal atrial fibrillation) (HCC) 05/01/2022   Secondary hypercoagulable state (HCC) 04/04/2022   Chronic systolic CHF (congestive heart failure) (HCC) 11/28/2021   Macular degeneration 11/28/2021   MCI (mild cognitive impairment) 11/28/2021   OSA (obstructive sleep apnea)    Protein-calorie malnutrition, severe 10/22/2020   Depression    Bilateral pleural effusion 10/19/2020   Pulmonary HTN (HCC) 10/11/2020   Tricuspid regurgitation 10/11/2020   HLD (hyperlipidemia) 10/11/2020   Hypotension 10/11/2020   Hypokalemia  10/11/2020   Chest pain at rest 10/05/2020   CAD (coronary artery disease) 04/24/2014   COPD (chronic obstructive pulmonary disease) (HCC) 04/24/2014   PCP:  Georgann Housekeeper, MD Pharmacy:   [facility] [address], [place] - [address] [facility] [place] [address] Phone: [phone] Fax: [phone]     Social Determinants of Health (SDOH) Social History: SDOH Screenings   Food  Insecurity: No Food Insecurity (09/05/2022)  Housing: Patient Unable To Answer (09/05/2022)  Transportation Needs: No Transportation Needs (09/05/2022)  Utilities: Not At Risk (09/05/2022)  Tobacco Use: Medium Risk (10/27/2022)   SDOH Interventions:     Readmission Risk Interventions    09/07/2022    3:25 PM  Readmission Risk Prevention Plan  Transportation Screening Complete  PCP or Specialist Appt within 5-7 Days Complete  Home Care Screening Complete  Medication Review (RN CM) Complete

## 2022-10-30 NOTE — TOC Benefit Eligibility Note (Signed)
Pharmacy Patient Advocate Encounter  Insurance verification completed.    The patient is insured through [facility] Medicare Part D  Ran test claim for Symbicort 160 mcg and the current 30 day co-pay is $47.00.  Ran test claim for Breo Ellipta 200-25 mcg/act and the current 30 day co-pay is $47.00.   This test claim was processed through [facility]- copay amounts may vary at other pharmacies due to pharmacy/plan contracts, or as the patient moves through the different stages of their insurance plan.    Roland Earl, CPHT Pharmacy Patient Advocate Specialist [facility] Patient Advocate Team Direct Number: [phone]  Fax: [phone]

## 2022-10-30 NOTE — TOC Transition Note (Signed)
Transition of Care [facility]) - CM/SW Discharge Note   Patient Details  Name: Laurie James MRN: [id number] Date of Birth: 17-Aug-1945  [facility]) CM/SW Contact:  Leone Haven, RN Phone Number: 10/30/2022, 12:06 PM   Clinical Narrative:    For dc today and daughter , Tresa Endo at bedside will transport her home.    Final next level of care: Home/Self Care Barriers to Discharge: No Barriers Identified   Patient Goals and CMS Choice   Choice offered to / list presented to : NA  Discharge Placement                         Discharge Plan and Services Additional resources added to the After Visit Summary for   In-house Referral: NA Discharge Planning Services: CM Consult Post Acute Care Choice: NA            DME Agency: NA       HH Arranged: NA          Social Determinants of Health (SDOH) Interventions SDOH Screenings   Food Insecurity: No Food Insecurity (09/05/2022)  Housing: Patient Unable To Answer (09/05/2022)  Transportation Needs: No Transportation Needs (09/05/2022)  Utilities: Not At Risk (09/05/2022)  Tobacco Use: Medium Risk (10/27/2022)     Readmission Risk Interventions    09/07/2022    3:25 PM  Readmission Risk Prevention Plan  Transportation Screening Complete  PCP or Specialist Appt within 5-7 Days Complete  Home Care Screening Complete  Medication Review (RN CM) Complete

## 2022-10-30 NOTE — Progress Notes (Signed)
RN went over discharge instructions with patient and patient's daughter at the bedside. Patient made aware of medication changes and follow up appointments. Patient verbalize understanding. PIV removed. Tele monitor removed and CCMD notified. Transportation is in the room.

## 2022-10-30 NOTE — Progress Notes (Signed)
   10/30/22 0438  Assess: MEWS Score  Temp (!) 97.5 F (36.4 C)  BP (!) 95/57  MAP (mmHg) 65  Pulse Rate 96  ECG Heart Rate (!) 104  Resp 17  SpO2 99 %  O2 Device Room Air  Assess: MEWS Score  MEWS Temp 0  MEWS Systolic 1  MEWS Pulse 1  MEWS RR 0  MEWS LOC 0  MEWS Score 2  MEWS Score Color Yellow  Assess: if the MEWS score is Yellow or Red  Were vital signs taken at a resting state? Yes  Focused Assessment No change from prior assessment  Does the patient meet 2 or more of the SIRS criteria? No  MEWS guidelines implemented  No, other (Comment) (rechecked VS)  Assess: SIRS CRITERIA  SIRS Temperature  0  SIRS Pulse 1  SIRS Respirations  0  SIRS WBC 0  SIRS Score Sum  1

## 2022-10-30 NOTE — Progress Notes (Signed)
   10/30/22 0445  Assess: MEWS Score  Temp (!) 97.5 F (36.4 C)  BP 117/71  MAP (mmHg) 84  Resp 17  Assess: MEWS Score  MEWS Temp 0  MEWS Systolic 0  MEWS Pulse 1  MEWS RR 0  MEWS LOC 0  MEWS Score 1  MEWS Score Color Green  Assess: SIRS CRITERIA  SIRS Temperature  0  SIRS Pulse 1  SIRS Respirations  0  SIRS WBC 0  SIRS Score Sum  1

## 2022-10-30 NOTE — Progress Notes (Signed)
Heart Failure Navigator Progress Note  Assessed for Heart & Vascular TOC clinic readiness.  Patient does not meet criteria due to Advanced Heart Failure Team patient.   Navigator will sign off at this time.    Dawn Fields, BSN, RN Heart Failure Nurse Navigator Secure Chat Only   

## 2022-10-30 NOTE — Plan of Care (Signed)
  Problem: Nutrition: Goal: Adequate nutrition will be maintained Outcome: Completed/Met   Problem: Pain Managment: Goal: General experience of comfort will improve Outcome: Completed/Met

## 2022-11-10 ENCOUNTER — Ambulatory Visit (HOSPITAL_COMMUNITY)
Admission: RE | Admit: 2022-11-10 | Discharge: 2022-11-10 | Disposition: A | Discharge status: Home / Self Care | Payer: Medicare PPO | Source: Ambulatory Visit | Attending: Family Medicine | Admitting: Family Medicine

## 2022-11-10 ENCOUNTER — Encounter (HOSPITAL_COMMUNITY): Payer: Self-pay

## 2022-11-10 VITALS — BP 126/76 | HR 67 | Wt 108.2 lb

## 2022-11-10 DIAGNOSIS — Z79899 Other long term (current) drug therapy: Secondary | ICD-10-CM | POA: Insufficient documentation

## 2022-11-10 DIAGNOSIS — I480 Paroxysmal atrial fibrillation: Secondary | ICD-10-CM | POA: Insufficient documentation

## 2022-11-10 DIAGNOSIS — J90 Pleural effusion, not elsewhere classified: Secondary | ICD-10-CM | POA: Insufficient documentation

## 2022-11-10 DIAGNOSIS — H548 Legal blindness, as defined in USA: Secondary | ICD-10-CM | POA: Insufficient documentation

## 2022-11-10 DIAGNOSIS — I2721 Secondary pulmonary arterial hypertension: Secondary | ICD-10-CM | POA: Diagnosis not present

## 2022-11-10 DIAGNOSIS — Z7901 Long term (current) use of anticoagulants: Secondary | ICD-10-CM | POA: Insufficient documentation

## 2022-11-10 DIAGNOSIS — I2510 Atherosclerotic heart disease of native coronary artery without angina pectoris: Secondary | ICD-10-CM | POA: Insufficient documentation

## 2022-11-10 DIAGNOSIS — I428 Other cardiomyopathies: Secondary | ICD-10-CM | POA: Diagnosis not present

## 2022-11-10 DIAGNOSIS — I130 Hypertensive heart and chronic kidney disease with heart failure and stage 1 through stage 4 chronic kidney disease, or unspecified chronic kidney disease: Secondary | ICD-10-CM | POA: Diagnosis not present

## 2022-11-10 DIAGNOSIS — N179 Acute kidney failure, unspecified: Secondary | ICD-10-CM | POA: Diagnosis not present

## 2022-11-10 DIAGNOSIS — W19XXXA Unspecified fall, initial encounter: Secondary | ICD-10-CM | POA: Insufficient documentation

## 2022-11-10 DIAGNOSIS — Z87891 Personal history of nicotine dependence: Secondary | ICD-10-CM | POA: Insufficient documentation

## 2022-11-10 DIAGNOSIS — S5011XA Contusion of right forearm, initial encounter: Secondary | ICD-10-CM | POA: Diagnosis not present

## 2022-11-10 DIAGNOSIS — I5022 Chronic systolic (congestive) heart failure: Secondary | ICD-10-CM | POA: Diagnosis not present

## 2022-11-10 DIAGNOSIS — N1832 Chronic kidney disease, stage 3b: Secondary | ICD-10-CM | POA: Diagnosis not present

## 2022-11-10 DIAGNOSIS — J449 Chronic obstructive pulmonary disease, unspecified: Secondary | ICD-10-CM | POA: Insufficient documentation

## 2022-11-10 DIAGNOSIS — I071 Rheumatic tricuspid insufficiency: Secondary | ICD-10-CM

## 2022-11-10 DIAGNOSIS — S40021S Contusion of right upper arm, sequela: Secondary | ICD-10-CM

## 2022-11-10 LAB — BRAIN NATRIURETIC PEPTIDE: B Natriuretic Peptide: 854.2 pg/mL — ABNORMAL HIGH (ref 0.0–100.0)

## 2022-11-10 LAB — BASIC METABOLIC PANEL
Anion gap: 8 (ref 5–15)
BUN: 22 mg/dL (ref 8–23)
CO2: 21 mmol/L — ABNORMAL LOW (ref 22–32)
Calcium: 8.3 mg/dL — ABNORMAL LOW (ref 8.9–10.3)
Chloride: 104 mmol/L (ref 98–111)
Creatinine, Ser: 1.26 mg/dL — ABNORMAL HIGH (ref 0.44–1.00)
GFR, Estimated: 44 mL/min — ABNORMAL LOW (ref >60)
Glucose, Bld: 83 mg/dL (ref 70–99)
Potassium: 3.4 mmol/L — ABNORMAL LOW (ref 3.5–5.1)
Sodium: 133 mmol/L — ABNORMAL LOW (ref 135–145)

## 2022-11-10 LAB — CBC
HCT: 26.9 % — ABNORMAL LOW (ref 36.0–46.0)
Hemoglobin: 8.4 g/dL — ABNORMAL LOW (ref 12.0–15.0)
MCH: 27.5 pg (ref 26.0–34.0)
MCHC: 31.2 g/dL (ref 30.0–36.0)
MCV: 88.2 fL (ref 80.0–100.0)
Platelets: 288 10*3/uL (ref 150–400)
RBC: 3.05 MIL/uL — ABNORMAL LOW (ref 3.87–5.11)
RDW: 18.1 % — ABNORMAL HIGH (ref 11.5–15.5)
WBC: 8.5 10*3/uL (ref 4.0–10.5)
nRBC: 0 % (ref 0.0–0.2)

## 2022-11-10 NOTE — Progress Notes (Signed)
Advanced Heart Failure Clinic Note    PCP: Georgann Housekeeper, MD Primary Cardiologist: Charlton Haws, MD  HF Cardiologist: Dr. Gala Romney  HPI: Laurie James is a 77 y.o. woman with severe COPD, blindness, malnutrition/frailty, HTN, systolic HF due to NICM (diagnosed 7/22) and atrial fibrillation.   Admitted 6/22 with new onset AF. EF normal and RV dilated. Cath with nonobstructive CAD. Sent home with [facility] and rate control w/ plans for outpatient DCCV after 3 weeks of a/c.   Readmitted 7/22 with acute HF -> cardiogenic shock. TEE EF 20% RV moderately down RA massively dilated. Heavy smoke in RA/LA and aortic root. DCCV canceled. Initial Co-ox 38% and required milrinone. Had right sided thoracentesis on 07/13 and subsequent CXRs showed some reaccumulation. Diuresed with IV lasix. Po lasix PRN at discharge.  D/c weight 99 lbs.  Echo 03/21/21: EF 55-60% RV dilated with normal function. Severe TR.   Admitted 8/23 with afib RVR in the 150's. Echo 8/23  showed EF: 40-45%, RV function mildly reduced. Treated with amio.   AF ablation 03/08/22   Seen 03/31/22  in ED for mild hemoptysis. CT chest ok. Now resolved.   1/24 R thoracentesis   Echo (05/31/22): EF 50-55% severe TR  Admitted 5/24 with ADHF and recurrent R pleural effusion. Underwent R thora 900cc - transudative).  RHC 09/05/22 RA 4 PA 43/10 (21) PCWP 11 PVR 6.5 Fick 2.6/1.9  Follow up 09/26/22 with Dr. Gala Romney, NYHA III, volume OK on Lasix 20 mg PRN. Struggled tolerating GDMT with AKI, so plan to remain off unless EF declines.  Admitted 7/24 with a/c dHF. BNP 1045, diuresed with IV lasix. Lt echo showed EF 55-60%, G2DD, RV ok. Severe TR. S/p R thora 10/28/22--> 1L fluid. She was discharged home, weight 105 lbs.  Today she returns for post hospital HF follow up with her granddaughter. Overall feeling fine. She is able to get around her house and do basic ADLs, remains weak. She is legally blind. Denies palpitations, CP, dizziness, edema, or  PND/Orthopnea. Appetite ok. No fever or chills. She does not weigh at home. Taking all medications, she has restarted her Synthroid. Takes lasix rarely. Scrapped her forearm and has a hematoma. Saw PCP who stopped AC for several days and applied compression, swelling has gone down some.   Cardiac Studies: - Ltd Echo (7/24): EF 55-60%, G2DD, RV ok, severe TR.  - RHC (5/24): RA 4 PA 43/10 (21) PCWP 11 PVR 6.5 Fick 2.6/1.9  - Echo (2/24): EF 50-55%, severe TR  - Echo (8/23): EF 40-45%, grade II DD, RV mildly reduced, moderate TR  - Echo (6/23): EF 45-50%, RV ok, severe TR  - Echo (10/06/20): EF 55-60 %   - LHC (10/07/20) showed mild 40% segmental RCA stenosis as well as 50 to 60% second diagonal branch stenosis  RHC 6/22 RA 3 PA 17/2 (8) PCWP 7 PVR  0.25 WU Fick 2.6/2.0 (LVEF was low)  - TEE (7/22): EF 20% RV moderately down RA massively dilated. Heavy smoke in RA/LA and aortic root.   Past Medical History:  Diagnosis Date   AMD (age related macular degeneration)    COPD (chronic obstructive pulmonary disease) (HCC)    Depression    Hiatal hernia    Intermittent low back pain    Memory impairment    Midsternal chest pain    a. 12/2011 Cardiac CTA Ca++ score of 103.3 (80th %), LAD <50p/m, RCA 50-75.   Osteoporosis    PAF (paroxysmal atrial fibrillation) (HCC) 09/2020  Paroxysmal atrial fibrillation with RVR (HCC) 11/28/2021   Vitamin B 12 deficiency    Current Outpatient Medications  Medication Sig Dispense Refill   albuterol (VENTOLIN HFA) 108 (90 Base) MCG/ACT inhaler Inhale 2 puffs into the lungs every 4 (four) hours as needed for wheezing.     amiodarone (PACERONE) 200 MG tablet Take 1 tablet (200 mg total) by mouth daily. 30 tablet 6   apixaban (ELIQUIS) 2.5 MG TABS tablet Take 1 tablet (2.5 mg total) by mouth 2 (two) times daily. 60 tablet 11   atorvastatin (LIPITOR) 40 MG tablet Take 1 tablet by mouth once daily 90 tablet 3   budesonide-formoterol (SYMBICORT) 80-4.5  MCG/ACT inhaler Inhale 2 puffs into the lungs in the morning and at bedtime. 1 each 0   cyanocobalamin (,VITAMIN B-12,) 1000 MCG/ML injection Inject 1,000 mcg into the muscle every 30 (thirty) days.  3   furosemide (LASIX) 20 MG tablet Take 1 tablet (20 mg total) by mouth as needed. Leg edema (Patient taking differently: Take 20 mg by mouth as needed for fluid.) 30 tablet 6   nitroGLYCERIN (NITROSTAT) 0.4 MG SL tablet Place 1 tablet (0.4 mg total) under the tongue every 5 (five) minutes x 3 doses as needed for chest pain. 25 tablet 0   ondansetron (ZOFRAN) 4 MG tablet Take 4 mg by mouth 3 (three) times daily as needed for nausea or vomiting.     pantoprazole (PROTONIX) 40 MG tablet Take 1 tablet (40 mg total) by mouth 2 (two) times daily. 180 tablet 3   No current facility-administered medications for this encounter.   Allergies  Allergen Reactions   Codeine Nausea And Vomiting and Other (See Comments)    Upsets the stomach   Social History   Socioeconomic History   Marital status: Married    Spouse name: Not on file   Number of children: Not on file   Years of education: Not on file   Highest education level: Not on file  Occupational History   Occupation: retired  Tobacco Use   Smoking status: Former    Current packs/day: 0.00    Average packs/day: 1 pack/day for 65.0 years (65.0 ttl pk-yrs)    Types: Cigarettes    Start date: 09/1955    Quit date: 09/2020    Years since quitting: 2.1   Smokeless tobacco: Never   Tobacco comments:    Former smoker 04/04/22  Vaping Use   Vaping status: Never Used  Substance and Sexual Activity   Alcohol use: No    Alcohol/week: 0.0 standard drinks of alcohol    Comment: rarely   Drug use: No   Sexual activity: Not Currently  Other Topics Concern   Not on file  Social History Narrative   Not on file   Social Determinants of Health   Financial Resource Strain: Not on file  Food Insecurity: No Food Insecurity (09/05/2022)   Hunger  Vital Sign    Worried About Running Out of Food in the Last Year: Never true    Ran Out of Food in the Last Year: Never true  Transportation Needs: No Transportation Needs (09/05/2022)   PRAPARE - Administrator, Civil Service (Medical): No    Lack of Transportation (Non-Medical): No  Physical Activity: Not on file  Stress: Not on file  Social Connections: Not on file  Intimate Partner Violence: Not At Risk (09/05/2022)   Humiliation, Afraid, Rape, and Kick questionnaire    Fear of Current or Ex-Partner: No  Emotionally Abused: No    Physically Abused: No    Sexually Abused: No   Family History  Problem Relation Age of Onset   Breast cancer Sister    Breast cancer Sister    BP 126/76   Pulse 67   Wt 49.1 kg (108 lb 3.2 oz)   SpO2 100%   BMI 24.26 kg/m   Wt Readings from Last 3 Encounters:  11/10/22 49.1 kg (108 lb 3.2 oz)  10/30/22 47.9 kg (105 lb 9.6 oz)  09/26/22 48.4 kg (106 lb 9.6 oz)   PHYSICAL EXAM: General:  NAD. No resp difficulty, arrived in [facility], frail HEENT: Blind Neck: Supple. No JVD, + cv waves. Carotids 2+ bilat; no bruits. No lymphadenopathy or thryomegaly appreciated. Cor: PMI nondisplaced. Regular rate & rhythm. No rubs, gallops, 2/6 TR Lungs: Clear Abdomen: Soft, nontender, nondistended. No hepatosplenomegaly. No bruits or masses. Good bowel sounds. Extremities: No cyanosis, clubbing, rash, edema; +hematoma to R forearm Neuro: Alert & oriented x 3, cranial nerves grossly intact. Moves all 4 extremities w/o difficulty. Affect pleasant. . ECG (personally reviewed): NSR, 66 bpm  REDs: 22%  ASSESSMENT & PLAN:  1. Chronic Systolic Heart Failure - NICM - Echo (6/22): EF 20% RV moderately down RA massively dilated. Tachymediated from rapid Afib.  - LHC w/ nonobstructive CAD - Echo (12/22): EF normalized w/ restoration and maintenance of NSR, 55-60%, RV normal - Echo (5/23): EF back down, 45-50% (recurrent Afib w/ CVR) - Limited echo (8/23):  EF 40-45%, RV mildly reduced, L/R atria severely dilated. Mild MR, Mod TR - Echo 2/24 EF 50-55% RV ok severe TR - RHC 09/05/22 RA 4 PA 43/10 (21) PCWP 11 PVR 6.5 Fick 2.6/1.9 - Ltd echo (6/24): EF 55-60%, G2DD, RV ok, severe TR. - NYHA III, limited by physical deconditioning and COPD. Volume OK today. ReDs 22% - Continue Lasix 20 mg PRN. Discussed it was OK to take MWF w/ extra KCL - Off Entresto, spiro and Jardiance with recurrent AKI and hyperkalemia; failed x 2.  Will not rechallenge unless EF drops back down.  - Stable today.  - Labs today.  2. PAH with severe TR - Echo 2/24 EF 50-55% RV ok severe TR - RHC 09/05/22 RA 4 PA 43/10 (21) PCWP 11 PVR 6.5 Fick 2.6/1.9 - Suspect primarily WHO Group 3 PH in setting of advanced COPD. Not candidate for selective pulmonary vasodilators - Dr. Gala Romney to review with Structural team regarding candidacy for TV clip with functional TR. (She is reluctant to go to [place])  3. PAF  - she does not tolerate Afib well (prior h/o tachy mediated CM and cardiogenic shock). Long term suppression w/ amiodarone not ideal w/ COPD.  - s/p AF RFA 11/23 - followed in AF Clinic.  - Maintained on amio 200 mg daily. - Remains in NSR on ECG today. - Continue Eliquis 2.5 mg bid. CBC today. - LFTs ok, TSH high and Free T4 low but she was off her levothyroxine, now back on.  4. Recurrent R pleural effusion - s/p tap 1/24 and 5/24 (transudative) - Suspect hydro-pneumothorax in setting of probable cardiac cirrhosis (hepatic u/s 2022 suggestive of cirrhosis. CT 2024 no mention of cirrhosis) - May need Pleurex, but she is reluctant about this   5. H/o AKI on CKD Stage IIIb and Hyperkalemia - Had AKI in 10/23 Entresto and Jardiance stopped - Labs today  6. Right forearm hematoma - 2/2 trauma from falling against car - Neurovascularly intact, pulses 2+ on exam  today. - Check CBC, follow up with PCP  Follow up in 3 months with Dr. Tasia Catchings, as scheduled.    Jacklynn Ganong, FNP  2:50 PM

## 2022-11-10 NOTE — Patient Instructions (Addendum)
Thank you for coming in today  EKG was done today  If you had labs drawn today, any labs that are abnormal the clinic will call you No news is good news  Medications:no changes   Follow up appointments:  Your physician recommends that you schedule a follow-up appointment in:  3 months  With Dr. Gala Romney You will receive a reminder letter in the mail a few months in advance. If you don't receive a letter, please call our office to schedule the follow-up appointment.    Do the following things EVERYDAY: Weigh yourself in the morning before breakfast. Write it down and keep it in a log. Take your medicines as prescribed Eat low salt foods--Limit salt (sodium) to 2000 mg per day.  Stay as active as you can everyday Limit all fluids for the day to less than 2 liters   At the [facility], you and your health needs are our priority. As part of our continuing mission to provide you with exceptional heart care, we have created designated Provider Care Teams. These Care Teams include your primary Cardiologist (physician) and Advanced Practice Providers (APPs- Physician Assistants and Nurse Practitioners) who all work together to provide you with the care you need, when you need it.   You may see any of the following providers on your designated Care Team at your next follow up: Dr Arvilla Meres Dr Marca Ancona Dr. Marcos Eke, NP Robbie Lis, [place] [facility] [place], [place] Brynda Peon, NP Karle Plumber, PharmD   Please be sure to bring in all your medications bottles to every appointment.    Thank you for choosing [facility]  If you have any questions or concerns before your next appointment please send [place] a message through [place] or call our office at [phone].    TO LEAVE A MESSAGE FOR THE NURSE SELECT OPTION 2, PLEASE LEAVE A MESSAGE INCLUDING: YOUR NAME DATE OF BIRTH CALL BACK  NUMBER REASON FOR CALL**this is important as we prioritize the call backs  YOU WILL RECEIVE A CALL BACK THE SAME DAY AS LONG AS YOU CALL BEFORE 4:00 PM

## 2022-11-10 NOTE — Progress Notes (Signed)
ReDS Vest / Clip - 11/10/22 1400       ReDS Vest / Clip   Station Marker A    Ruler Value 24    ReDS Value Range Low volume    ReDS Actual Value 22

## 2022-11-24 DIAGNOSIS — S81801A Unspecified open wound, right lower leg, initial encounter: Secondary | ICD-10-CM | POA: Diagnosis not present

## 2022-11-29 DIAGNOSIS — E039 Hypothyroidism, unspecified: Secondary | ICD-10-CM | POA: Diagnosis not present

## 2022-11-30 ENCOUNTER — Ambulatory Visit: Payer: Medicare PPO | Admitting: Cardiology

## 2022-12-05 ENCOUNTER — Telehealth: Payer: Self-pay | Admitting: *Deleted

## 2022-12-05 ENCOUNTER — Ambulatory Visit: Payer: Medicare PPO | Admitting: Cardiology

## 2022-12-05 NOTE — Telephone Encounter (Signed)
Son returned my call, appointment has been cancelled, as pt is doing fine and will follow up with CHF clinic.

## 2022-12-05 NOTE — Telephone Encounter (Signed)
Tried to place call to pt, call wouldn't go throug.  Spoke with son, Fraser Din, [place] on file.  He will call pt to make sure she is ok, not having any problems, as she just saw CHF Clinic 11/10/22 and following back up in 3 months.  Per Perlie Gold, NP, pt doesn't need appointment today if doing ok.  Son will find out and call me back.

## 2022-12-27 ENCOUNTER — Encounter (HOSPITAL_COMMUNITY): Payer: Self-pay

## 2022-12-27 ENCOUNTER — Observation Stay (HOSPITAL_COMMUNITY): Payer: Medicare PPO

## 2022-12-27 ENCOUNTER — Other Ambulatory Visit: Payer: Self-pay

## 2022-12-27 ENCOUNTER — Emergency Department (HOSPITAL_COMMUNITY): Payer: Medicare PPO

## 2022-12-27 ENCOUNTER — Observation Stay (HOSPITAL_COMMUNITY)
Admission: EM | Admit: 2022-12-27 | Discharge: 2022-12-29 | Disposition: A | Discharge status: Home / Self Care | Payer: Medicare PPO | Attending: Internal Medicine | Admitting: Internal Medicine

## 2022-12-27 DIAGNOSIS — K219 Gastro-esophageal reflux disease without esophagitis: Secondary | ICD-10-CM | POA: Insufficient documentation

## 2022-12-27 DIAGNOSIS — I2510 Atherosclerotic heart disease of native coronary artery without angina pectoris: Secondary | ICD-10-CM | POA: Diagnosis present

## 2022-12-27 DIAGNOSIS — Z1152 Encounter for screening for COVID-19: Secondary | ICD-10-CM | POA: Diagnosis not present

## 2022-12-27 DIAGNOSIS — H538 Other visual disturbances: Secondary | ICD-10-CM | POA: Diagnosis not present

## 2022-12-27 DIAGNOSIS — Z7901 Long term (current) use of anticoagulants: Secondary | ICD-10-CM | POA: Diagnosis not present

## 2022-12-27 DIAGNOSIS — I130 Hypertensive heart and chronic kidney disease with heart failure and stage 1 through stage 4 chronic kidney disease, or unspecified chronic kidney disease: Secondary | ICD-10-CM | POA: Diagnosis not present

## 2022-12-27 DIAGNOSIS — Z79899 Other long term (current) drug therapy: Secondary | ICD-10-CM | POA: Insufficient documentation

## 2022-12-27 DIAGNOSIS — R0689 Other abnormalities of breathing: Secondary | ICD-10-CM | POA: Diagnosis not present

## 2022-12-27 DIAGNOSIS — I071 Rheumatic tricuspid insufficiency: Secondary | ICD-10-CM | POA: Diagnosis present

## 2022-12-27 DIAGNOSIS — M6281 Muscle weakness (generalized): Secondary | ICD-10-CM | POA: Insufficient documentation

## 2022-12-27 DIAGNOSIS — J441 Chronic obstructive pulmonary disease with (acute) exacerbation: Secondary | ICD-10-CM | POA: Diagnosis not present

## 2022-12-27 DIAGNOSIS — J449 Chronic obstructive pulmonary disease, unspecified: Secondary | ICD-10-CM | POA: Diagnosis present

## 2022-12-27 DIAGNOSIS — E785 Hyperlipidemia, unspecified: Secondary | ICD-10-CM | POA: Insufficient documentation

## 2022-12-27 DIAGNOSIS — I5032 Chronic diastolic (congestive) heart failure: Secondary | ICD-10-CM | POA: Diagnosis not present

## 2022-12-27 DIAGNOSIS — J168 Pneumonia due to other specified infectious organisms: Secondary | ICD-10-CM | POA: Diagnosis not present

## 2022-12-27 DIAGNOSIS — R531 Weakness: Secondary | ICD-10-CM | POA: Diagnosis not present

## 2022-12-27 DIAGNOSIS — I480 Paroxysmal atrial fibrillation: Secondary | ICD-10-CM | POA: Diagnosis present

## 2022-12-27 DIAGNOSIS — J189 Pneumonia, unspecified organism: Secondary | ICD-10-CM

## 2022-12-27 DIAGNOSIS — I509 Heart failure, unspecified: Secondary | ICD-10-CM | POA: Diagnosis not present

## 2022-12-27 DIAGNOSIS — N1831 Chronic kidney disease, stage 3a: Secondary | ICD-10-CM | POA: Diagnosis present

## 2022-12-27 DIAGNOSIS — Z87891 Personal history of nicotine dependence: Secondary | ICD-10-CM | POA: Diagnosis not present

## 2022-12-27 DIAGNOSIS — R2681 Unsteadiness on feet: Secondary | ICD-10-CM | POA: Diagnosis not present

## 2022-12-27 DIAGNOSIS — R0602 Shortness of breath: Secondary | ICD-10-CM | POA: Diagnosis not present

## 2022-12-27 DIAGNOSIS — J90 Pleural effusion, not elsewhere classified: Secondary | ICD-10-CM | POA: Diagnosis not present

## 2022-12-27 DIAGNOSIS — I700 Atherosclerosis of aorta: Secondary | ICD-10-CM | POA: Diagnosis not present

## 2022-12-27 DIAGNOSIS — R059 Cough, unspecified: Secondary | ICD-10-CM | POA: Diagnosis not present

## 2022-12-27 HISTORY — DX: Pneumonia, unspecified organism: J18.9

## 2022-12-27 HISTORY — PX: IR THORACENTESIS ASP PLEURAL SPACE W/IMG GUIDE: IMG5380

## 2022-12-27 LAB — CBC WITH DIFFERENTIAL/PLATELET
Abs Immature Granulocytes: 0.06 10*3/uL (ref 0.00–0.07)
Basophils Absolute: 0 10*3/uL (ref 0.0–0.1)
Basophils Relative: 0 %
Eosinophils Absolute: 0.9 10*3/uL — ABNORMAL HIGH (ref 0.0–0.5)
Eosinophils Relative: 7 %
HCT: 27.6 % — ABNORMAL LOW (ref 36.0–46.0)
Hemoglobin: 8.6 g/dL — ABNORMAL LOW (ref 12.0–15.0)
Immature Granulocytes: 1 %
Lymphocytes Relative: 5 %
Lymphs Abs: 0.7 10*3/uL (ref 0.7–4.0)
MCH: 27.2 pg (ref 26.0–34.0)
MCHC: 31.2 g/dL (ref 30.0–36.0)
MCV: 87.3 fL (ref 80.0–100.0)
Monocytes Absolute: 0.5 10*3/uL (ref 0.1–1.0)
Monocytes Relative: 4 %
Neutro Abs: 10.5 10*3/uL — ABNORMAL HIGH (ref 1.7–7.7)
Neutrophils Relative %: 83 %
Platelets: 313 10*3/uL (ref 150–400)
RBC: 3.16 MIL/uL — ABNORMAL LOW (ref 3.87–5.11)
RDW: 18.3 % — ABNORMAL HIGH (ref 11.5–15.5)
WBC: 12.7 10*3/uL — ABNORMAL HIGH (ref 4.0–10.5)
nRBC: 0 % (ref 0.0–0.2)

## 2022-12-27 LAB — BASIC METABOLIC PANEL WITH GFR
Anion gap: 11 (ref 5–15)
BUN: 23 mg/dL (ref 8–23)
CO2: 22 mmol/L (ref 22–32)
Calcium: 8.6 mg/dL — ABNORMAL LOW (ref 8.9–10.3)
Chloride: 105 mmol/L (ref 98–111)
Creatinine, Ser: 1.1 mg/dL — ABNORMAL HIGH (ref 0.44–1.00)
GFR, Estimated: 52 mL/min — ABNORMAL LOW (ref >60)
Glucose, Bld: 95 mg/dL (ref 70–99)
Potassium: 3.5 mmol/L (ref 3.5–5.1)
Sodium: 138 mmol/L (ref 135–145)

## 2022-12-27 LAB — LACTATE DEHYDROGENASE: LDH: 241 U/L — ABNORMAL HIGH (ref 98–192)

## 2022-12-27 LAB — PROTEIN, PLEURAL OR PERITONEAL FLUID: Total protein, fluid: 4 g/dL

## 2022-12-27 LAB — LACTATE DEHYDROGENASE, PLEURAL OR PERITONEAL FLUID: LD, Fluid: 80 U/L — ABNORMAL HIGH (ref 3–23)

## 2022-12-27 LAB — BRAIN NATRIURETIC PEPTIDE: B Natriuretic Peptide: 1147.8 pg/mL — ABNORMAL HIGH (ref 0.0–100.0)

## 2022-12-27 LAB — HIV ANTIBODY (ROUTINE TESTING W REFLEX): HIV Screen 4th Generation wRfx: NONREACTIVE

## 2022-12-27 LAB — PROTEIN, TOTAL: Total Protein: 7.1 g/dL (ref 6.5–8.1)

## 2022-12-27 LAB — PROCALCITONIN: Procalcitonin: 0.15 ng/mL

## 2022-12-27 LAB — SARS CORONAVIRUS 2 BY RT PCR: SARS Coronavirus 2 by RT PCR: NEGATIVE

## 2022-12-27 MED ORDER — IPRATROPIUM-ALBUTEROL 0.5-2.5 (3) MG/3ML IN SOLN
3.0000 mL | Freq: Once | RESPIRATORY_TRACT | Status: AC
  Administered 2022-12-27: 3 mL via RESPIRATORY_TRACT
  Filled 2022-12-27: qty 3

## 2022-12-27 MED ORDER — ACETAMINOPHEN 325 MG PO TABS: 650.0000 mg | ORAL_TABLET | Freq: Four times a day (QID) | ORAL | Status: DC | PRN

## 2022-12-27 MED ORDER — APIXABAN 2.5 MG PO TABS
2.5000 mg | ORAL_TABLET | Freq: Two times a day (BID) | ORAL | Status: DC
  Administered 2022-12-28 – 2022-12-29 (×3): 2.5 mg via ORAL
  Filled 2022-12-27 (×3): qty 1

## 2022-12-27 MED ORDER — ATORVASTATIN CALCIUM 40 MG PO TABS
40.0000 mg | ORAL_TABLET | Freq: Every day | ORAL | Status: DC
  Administered 2022-12-28 – 2022-12-29 (×2): 40 mg via ORAL
  Filled 2022-12-27 (×2): qty 1

## 2022-12-27 MED ORDER — PANTOPRAZOLE SODIUM 40 MG PO TBEC
40.0000 mg | DELAYED_RELEASE_TABLET | Freq: Two times a day (BID) | ORAL | Status: DC
  Administered 2022-12-28 – 2022-12-29 (×3): 40 mg via ORAL
  Filled 2022-12-27 (×3): qty 1

## 2022-12-27 MED ORDER — LEVOTHYROXINE SODIUM 75 MCG PO TABS
75.0000 ug | ORAL_TABLET | Freq: Every day | ORAL | Status: DC
  Administered 2022-12-29: 75 ug via ORAL
  Filled 2022-12-27: qty 1

## 2022-12-27 MED ORDER — AMIODARONE HCL 200 MG PO TABS
200.0000 mg | ORAL_TABLET | Freq: Every day | ORAL | Status: DC
  Administered 2022-12-28 – 2022-12-29 (×2): 200 mg via ORAL
  Filled 2022-12-27 (×2): qty 1

## 2022-12-27 MED ORDER — ONDANSETRON HCL 4 MG/2ML IJ SOLN
4.0000 mg | Freq: Once | INTRAMUSCULAR | Status: AC
  Administered 2022-12-27: 4 mg via INTRAVENOUS
  Filled 2022-12-27: qty 2

## 2022-12-27 MED ORDER — DOXYCYCLINE HYCLATE 100 MG PO TABS
100.0000 mg | ORAL_TABLET | Freq: Two times a day (BID) | ORAL | Status: DC
  Administered 2022-12-28: 100 mg via ORAL
  Filled 2022-12-27: qty 1

## 2022-12-27 MED ORDER — ONDANSETRON HCL 4 MG/2ML IJ SOLN: 4.0000 mg | Freq: Four times a day (QID) | INTRAMUSCULAR | Status: DC | PRN

## 2022-12-27 MED ORDER — SODIUM CHLORIDE 0.9 % IV SOLN
2.0000 g | Freq: Once | INTRAVENOUS | Status: AC
  Administered 2022-12-27: 2 g via INTRAVENOUS
  Filled 2022-12-27: qty 20

## 2022-12-27 MED ORDER — PREDNISONE 10 MG PO TABS
40.0000 mg | ORAL_TABLET | Freq: Every day | ORAL | Status: DC
  Administered 2022-12-28 – 2022-12-29 (×2): 40 mg via ORAL
  Filled 2022-12-27 (×2): qty 4

## 2022-12-27 MED ORDER — DOXYCYCLINE HYCLATE 100 MG PO TABS
100.0000 mg | ORAL_TABLET | Freq: Once | ORAL | Status: AC
  Administered 2022-12-27: 100 mg via ORAL
  Filled 2022-12-27: qty 1

## 2022-12-27 MED ORDER — ONDANSETRON HCL 4 MG PO TABS: 4.0000 mg | ORAL_TABLET | Freq: Four times a day (QID) | ORAL | Status: DC | PRN

## 2022-12-27 MED ORDER — LIDOCAINE HCL 1 % IJ SOLN
INTRAMUSCULAR | Status: AC
  Filled 2022-12-27: qty 20

## 2022-12-27 MED ORDER — SODIUM CHLORIDE 0.9 % IV SOLN
2.0000 g | INTRAVENOUS | Status: DC
  Administered 2022-12-28 – 2022-12-29 (×2): 2 g via INTRAVENOUS
  Filled 2022-12-27 (×2): qty 20

## 2022-12-27 MED ORDER — SODIUM CHLORIDE 0.9 % IV SOLN: 500.0000 mg | INTRAVENOUS | Status: DC

## 2022-12-27 MED ORDER — IPRATROPIUM-ALBUTEROL 0.5-2.5 (3) MG/3ML IN SOLN
3.0000 mL | Freq: Three times a day (TID) | RESPIRATORY_TRACT | Status: DC
  Administered 2022-12-27 – 2022-12-28 (×5): 3 mL via RESPIRATORY_TRACT
  Filled 2022-12-27 (×4): qty 3

## 2022-12-27 MED ORDER — METHYLPREDNISOLONE SODIUM SUCC 40 MG IJ SOLR
40.0000 mg | Freq: Two times a day (BID) | INTRAMUSCULAR | Status: AC
  Administered 2022-12-27 – 2022-12-28 (×2): 40 mg via INTRAVENOUS
  Filled 2022-12-27 (×2): qty 1

## 2022-12-27 MED ORDER — LIDOCAINE HCL 1 % IJ SOLN
10.0000 mL | Freq: Once | INTRAMUSCULAR | Status: AC
  Administered 2022-12-27: 10 mL via INTRADERMAL

## 2022-12-27 MED ORDER — IPRATROPIUM-ALBUTEROL 0.5-2.5 (3) MG/3ML IN SOLN: 3.0000 mL | RESPIRATORY_TRACT | Status: DC | PRN

## 2022-12-27 MED ORDER — ACETAMINOPHEN 650 MG RE SUPP: 650.0000 mg | Freq: Four times a day (QID) | RECTAL | Status: DC | PRN

## 2022-12-27 NOTE — ED Notes (Addendum)
Pt has been transported for her thoracentesis.

## 2022-12-27 NOTE — ED Notes (Signed)
Patient transported to X-ray 

## 2022-12-27 NOTE — ED Notes (Addendum)
ED TO INPATIENT HANDOFF REPORT  ED Nurse Name and Phone #: [phone]  S Name/Age/Gender Laurie James 77 y.o. female Room/Bed: 038C/038C  Code Status   Code Status: Full Code  Home/SNF/Other Home Patient oriented to: self, place, time, and situation Is this baseline? Yes   Triage Complete: Triage complete  Chief Complaint Community acquired pneumonia [J18.9]  Triage Note Pt BIB [facility] from home d/t feeling weak for past 2 days with productive cough. Pt does reports Hx of COPD, CHF & A-Fib (with past ablation) & endorses past removal of fluid from her lungs. EMS reports 94% on RA, lowest of 92%, A/Ox5, regular/unlabored breathing, & 12 L unremarkable. 154/78, 64 bpm, no PIV. Allergy to codene & does take Eliquis.    Allergies Allergies  Allergen Reactions   Codeine Nausea And Vomiting and Other (See Comments)    Upsets the stomach    Level of Care/Admitting Diagnosis ED Disposition     ED Disposition  Admit   Condition  --   Comment  Hospital Area: [facility] [[id number]]  Level of Care: Med-Surg [16]  May place patient in observation at [facility] or [facility] if equivalent level of care is available:: No  Covid Evaluation: Confirmed COVID Negative  Diagnosis: Community acquired pneumonia [782956]  Admitting Physician: Almon Hercules [[id number]]  Attending Physician: Almon Hercules [[id number]]          B Medical/Surgery History Past Medical History:  Diagnosis Date   AMD (age related macular degeneration)    COPD (chronic obstructive pulmonary disease) (HCC)    Depression    Hiatal hernia    Intermittent low back pain    Memory impairment    Midsternal chest pain    a. 12/2011 Cardiac CTA Ca++ score of 103.3 (80th %), LAD <50p/m, RCA 50-75.   Osteoporosis    PAF (paroxysmal atrial fibrillation) (HCC) 09/2020   Paroxysmal atrial fibrillation with RVR (HCC) 11/28/2021   Vitamin B 12 deficiency    Past Surgical History:   Procedure Laterality Date   ABDOMINAL HYSTERECTOMY     ATRIAL FIBRILLATION ABLATION N/A 03/08/2022   Procedure: ATRIAL FIBRILLATION ABLATION;  Surgeon: Maurice Small, MD;  Location: [facility] INVASIVE CV LAB;  Service: Cardiovascular;  Laterality: N/A;   IR THORACENTESIS ASP PLEURAL SPACE W/IMG GUIDE  10/20/2020   IR THORACENTESIS ASP PLEURAL SPACE W/IMG GUIDE  05/02/2022   IR THORACENTESIS ASP PLEURAL SPACE W/IMG GUIDE  09/07/2022   IR THORACENTESIS ASP PLEURAL SPACE W/IMG GUIDE  12/27/2022   RIGHT HEART CATH N/A 09/06/2022   Procedure: RIGHT HEART CATH;  Surgeon: Dorthula Nettles, DO;  Location: [facility]VAS[facility] CV [facility];  Service: Cardiovascular;  Laterality: N/A;   RIGHT/LEFT HEART CATH AND CORONARY ANGIOGRAPHY N/A 10/07/2020   Procedure: RIGHT/LEFT HEART CATH AND CORONARY ANGIOGRAPHY;  Surgeon: Runell Gess, MD;  Location: [facility];  Service: Cardiovascular;  Laterality: N/A;   TEE WITHOUT CARDIOVERSION N/A 10/25/2020   Procedure: TRANSESOPHAGEAL ECHOCARDIOGRAM (TEE);  Surgeon: Quintella Reichert, MD;  Location: [facility];  Service: Cardiovascular;  Laterality: N/A;     A IV Location/Drains/Wounds Patient Lines/Drains/Airways Status     Active Line/Drains/Airways     Name Placement date Placement time Site Days   Peripheral IV 12/27/22 20 G Anterior;Left Forearm 12/27/22  1043  Forearm  less than 1   Wound / Incision (Open or Dehisced) 10/20/20 Skin tear Pretibial Left 3cm skin tear to left shin 10/20/20  1055  Pretibial  798   Wound / Incision (Open or Dehisced) 09/05/22 Skin tear Ankle Anterior;Left Skin Tear 09/05/22  1745  Ankle  113            Intake/Output Last 24 hours No intake or output data in the 24 hours ending 12/27/22 1655  Labs/Imaging Results for orders placed or performed during the hospital encounter of 12/27/22 (from the past 48 hour(s))  SARS Coronavirus 2 by RT PCR (hospital order, performed in [facility] hospital lab) *cepheid single result test*  Anterior Nasal Swab     Status: None   Collection Time: 12/27/22  8:34 AM   Specimen: Anterior Nasal Swab  Result Value Ref Range   SARS Coronavirus 2 by RT PCR NEGATIVE NEGATIVE    Comment: Performed at [facility], [address], [place], [place] [address]  Brain natriuretic peptide     Status: Abnormal   Collection Time: 12/27/22 10:18 AM  Result Value Ref Range   B Natriuretic Peptide 1,147.8 (H) 0.0 - 100.0 pg/mL    Comment: Performed at [facility], [address] [place], [place] [address]  Basic metabolic panel     Status: Abnormal   Collection Time: 12/27/22 10:19 AM  Result Value Ref Range   Sodium 138 135 - 145 mmol/L   Potassium 3.5 3.5 - 5.1 mmol/L   Chloride 105 98 - 111 mmol/L   CO2 22 22 - 32 mmol/L   Glucose, Bld 95 70 - 99 mg/dL    Comment: Glucose reference range applies only to samples taken after fasting for at least 8 hours.   BUN 23 8 - 23 mg/dL   Creatinine, Ser 4.03 (H) 0.44 - 1.00 mg/dL   Calcium 8.6 (L) 8.9 - 10.3 mg/dL   GFR, Estimated 52 (L) >60 mL/min    Comment: (NOTE) Calculated using the CKD-EPI Creatinine Equation (2021)    Anion gap 11 5 - 15    Comment: Performed at [facility], [address], [place], [place] [address]  CBC with Differential     Status: Abnormal   Collection Time: 12/27/22 10:19 AM  Result Value Ref Range   WBC 12.7 (H) 4.0 - 10.5 K/uL   RBC 3.16 (L) 3.87 - 5.11 MIL/uL   Hemoglobin 8.6 (L) 12.0 - 15.0 g/dL   HCT 95.6 (L) 38.7 - 56.4 %   MCV 87.3 80.0 - 100.0 fL   MCH 27.2 26.0 - 34.0 pg   MCHC 31.2 30.0 - 36.0 g/dL   RDW 33.2 (H) 95.1 - 88.4 %   Platelets 313 150 - 400 K/uL   nRBC 0.0 0.0 - 0.2 %   Neutrophils Relative % 83 %   Neutro Abs 10.5 (H) 1.7 - 7.7 K/uL   Lymphocytes Relative 5 %   Lymphs Abs 0.7 0.7 - 4.0 K/uL   Monocytes Relative 4 %   Monocytes Absolute 0.5 0.1 - 1.0 K/uL   Eosinophils Relative 7 %   Eosinophils Absolute 0.9 (H) 0.0 - 0.5 K/uL   Basophils Relative 0 %    Basophils Absolute 0.0 0.0 - 0.1 K/uL   Immature Granulocytes 1 %   Abs Immature Granulocytes 0.06 0.00 - 0.07 K/uL    Comment: Performed at [facility], [address] [place], [place] [address]  Lactate dehydrogenase (pleural or peritoneal fluid)     Status: Abnormal   Collection Time: 12/27/22  1:24 PM  Result Value Ref Range   LD, Fluid 80 (H)  3 - 23 U/L    Comment: (NOTE) Results should be evaluated in conjunction with serum values    Fluid Type-FLDH PLEURAL     Comment: RIGHT FLUID Performed at [facility], [address], [place], [place] [address] CORRECTED ON 09/18 AT 1358: PREVIOUSLY REPORTED AS Lung, Right   Protein, pleural or peritoneal fluid     Status: None   Collection Time: 12/27/22  1:24 PM  Result Value Ref Range   Total protein, fluid 4.0 g/dL    Comment: (NOTE) No normal range established for this test Results should be evaluated in conjunction with serum values    Fluid Type-FTP PLEURAL     Comment: RIGHT FLUID Performed at [facility], [address] [place], [place] [address] CORRECTED ON 09/18 AT 1358: PREVIOUSLY REPORTED AS Lung, Right    IR THORACENTESIS ASP PLEURAL SPACE W/IMG GUIDE  Result Date: 12/27/2022 INDICATION: 77 year old female with recurrent right-sided pleural effusion due to heart failure presents with shortness of breath. Previous chest x-ray showed enlarging right pleural effusion. Request for therapeutic and diagnostic thoracentesis. EXAM: ULTRASOUND GUIDED RIGHT THORACENTESIS MEDICATIONS: 5 mL 1% lidocaine COMPLICATIONS: None immediate. PROCEDURE: An ultrasound guided thoracentesis was thoroughly discussed with the patient and questions answered. The benefits, risks, alternatives and complications were also discussed. The patient understands and wishes to proceed with the procedure. Written consent was obtained. Ultrasound was performed to localize and mark an adequate pocket of fluid in the right chest. The area  was then prepped and draped in the normal sterile fashion. 1% Lidocaine was used for local anesthesia. Under ultrasound guidance a 6 Fr Safe-T-Centesis catheter was introduced. Thoracentesis was performed. The catheter was removed and a dressing applied. FINDINGS: A total of approximately 1.3 L of hazy yellow fluid was removed. Samples were sent to the laboratory as requested by the clinical team. Post procedure chest X-ray reviewed, negative for pneumothorax. IMPRESSION: Successful ultrasound guided right thoracentesis yielding 1.3 L of pleural fluid. Performed by: Lawernce Ion, PA-C Electronically Signed   By: Gilmer Mor D.O.   On: 12/27/2022 15:11   DG Chest 1 View  Result Date: 12/27/2022 CLINICAL DATA:  Status post right thoracentesis EXAM: CHEST  1 VIEW COMPARISON:  Chest radiograph dated 12/27/2022 at 9:04 a.m. FINDINGS: Normal lung volumes. No focal consolidations. Decreased trace right pleural effusion. Similar trace left pleural effusion. No pneumothorax. Similar cardiomediastinal silhouette. No acute osseous abnormality. IMPRESSION: Decreased trace right pleural effusion status post thoracentesis. No pneumothorax. Electronically Signed   By: Agustin Cree M.D.   On: 12/27/2022 14:30   DG Chest 2 View  Result Date: 12/27/2022 CLINICAL DATA:  productive cough - 2 view requested EXAM: CHEST - 2 VIEW COMPARISON:  Chest radiograph 10/28/2022 FINDINGS: Moderate right and small left pleural effusion, increased from prior exam. There is a hazy opacity at the right lung base could represent atelectasis or infection. There is a right paratracheal opacity that likely represents a small amount of pleural fluid unchanged cardiac and mediastinal contours. No radiographically apparent displaced rib fractures. Visualized upper abdomen is unremarkable. Aortic atherosclerotic calcifications IMPRESSION: 1. Moderate right and small left pleural effusion, increased from prior exam. 2. Hazy opacity at the right lung base  could represent atelectasis or infection. Electronically Signed   By: Lorenza Cambridge M.D.   On: 12/27/2022 09:43    Pending Labs Unresulted Labs (From admission, onward)     Start     Ordered   12/27/22 1335  HIV Antibody (  routine testing w rflx)  (HIV Antibody (Routine testing w reflex) panel)  Once,   R        12/27/22 1336   12/27/22 1229  Protein, total  Once,   URGENT        12/27/22 1228   12/27/22 1228  Lactate dehydrogenase  Once,   STAT        12/27/22 1228   12/27/22 1227  Procalcitonin  Once,   URGENT       References:    Procalcitonin Lower Respiratory Tract Infection AND Sepsis Procalcitonin Algorithm   12/27/22 1226            Vitals/Pain Today's Vitals   12/27/22 1300 12/27/22 1358 12/27/22 1415 12/27/22 1541  BP:   118/66   Pulse: 65   62  Resp: 20   (!) 25  Temp:  98.2 F (36.8 C)    TempSrc:      SpO2: 99%   95%  PainSc:        Isolation Precautions No active isolations  Medications Medications  acetaminophen (TYLENOL) tablet 650 mg (has no administration in time range)    Or  acetaminophen (TYLENOL) suppository 650 mg (has no administration in time range)  methylPREDNISolone sodium succinate (SOLU-MEDROL) 40 mg/mL injection 40 mg (40 mg Intravenous Given 12/27/22 1631)    Followed by  predniSONE (DELTASONE) tablet 40 mg (has no administration in time range)  ipratropium-albuterol (DUONEB) 0.5-2.5 (3) MG/3ML nebulizer solution 3 mL (3 mLs Nebulization Given 12/27/22 1632)  ipratropium-albuterol (DUONEB) 0.5-2.5 (3) MG/3ML nebulizer solution 3 mL (has no administration in time range)  cefTRIAXone (ROCEPHIN) 2 g in sodium chloride 0.9 % 100 mL IVPB (has no administration in time range)  doxycycline (VIBRA-TABS) tablet 100 mg (has no administration in time range)  amiodarone (PACERONE) tablet 200 mg (has no administration in time range)  atorvastatin (LIPITOR) tablet 40 mg (has no administration in time range)  apixaban (ELIQUIS) tablet 2.5 mg (has no  administration in time range)  ipratropium-albuterol (DUONEB) 0.5-2.5 (3) MG/3ML nebulizer solution 3 mL (3 mLs Nebulization Given 12/27/22 1002)  cefTRIAXone (ROCEPHIN) 2 g in sodium chloride 0.9 % 100 mL IVPB (0 g Intravenous Stopped 12/27/22 1141)  doxycycline (VIBRA-TABS) tablet 100 mg (100 mg Oral Given 12/27/22 1005)  ondansetron (ZOFRAN) injection 4 mg (4 mg Intravenous Given 12/27/22 1155)  lidocaine (XYLOCAINE) 1 % (with pres) injection 10 mL (10 mLs Intradermal Given 12/27/22 1312)    Mobility walks     Focused Assessments Pulmonary Assessment Handoff:  Lung sounds:  diminished, coarse  Patient coughing up green sputum. Right Thoracentesis 1.3L drained, and samples sent to lab.  Patient on RA.         R Recommendations: See Admitting Provider Note  Report given to:   Additional Notes: Patient currently not wanting to take PO medications.

## 2022-12-27 NOTE — ED Triage Notes (Signed)
Pt BIB [facility] from home d/t feeling weak for past 2 days with productive cough. Pt does reports Hx of COPD, CHF & A-Fib (with past ablation) & endorses past removal of fluid from her lungs. EMS reports 94% on RA, lowest of 92%, A/Ox5, regular/unlabored breathing, & 12 L unremarkable. 154/78, 64 bpm, no PIV. Allergy to codene & does take Eliquis.

## 2022-12-27 NOTE — ED Provider Notes (Signed)
[place] EMERGENCY DEPARTMENT AT [facility] Provider Note   CSN: [id number] Arrival date & time: 12/27/22  0754     History  Chief Complaint  Patient presents with   Shortness of Breath   Cough    Laurie James is a 77 y.o. female with history of COPD not on home oxygen, congestive heart failure, A-fib status post ablation, on Eliquis, presenting to the ED with 2 to 3 days of productive cough and generalized weakness.  Patient denies fevers, chills, headache, diarrhea.  She presents by EMS.  She reports using albuterol inhaler "as needed".  She takes Lasix 20 mg every other day regularly for heart failure.  Per my review of external records she was most recently hospitalized in July 2 months ago for shortness of breath.  She was found to have bilateral pleural effusions, status postthoracentesis with 1 L of fluid removed, noted to be on amiodarone and Eliquis for paroxysmal A-fib.  She also had an echocardiogram with an EF of 55 to 60% at that time with severe tricuspid valve regurg.  HPI     Home Medications Prior to Admission medications   Medication Sig Start Date End Date Taking? Authorizing Provider  albuterol (VENTOLIN HFA) 108 (90 Base) MCG/ACT inhaler Inhale 2 puffs into the lungs every 4 (four) hours as needed for wheezing. 06/08/20   [provider]  amiodarone (PACERONE) 200 MG tablet Take 1 tablet (200 mg total) by mouth daily. 10/10/22   Bensimhon, Bevelyn Buckles, MD  apixaban (ELIQUIS) 2.5 MG TABS tablet Take 1 tablet (2.5 mg total) by mouth 2 (two) times daily. 01/17/22   Bensimhon, Bevelyn Buckles, MD  atorvastatin (LIPITOR) 40 MG tablet Take 1 tablet by mouth once daily 09/21/22   Wendall Stade, MD  budesonide-formoterol [facility]) 80-4.5 MCG/ACT inhaler Inhale 2 puffs into the lungs in the morning and at bedtime. 10/30/22   Briant Cedar, MD  cyanocobalamin (,VITAMIN B-12,) 1000 MCG/ML injection Inject 1,000 mcg into the muscle every 30  (thirty) days. 06/11/16   [provider]  furosemide (LASIX) 20 MG tablet Take 1 tablet (20 mg total) by mouth as needed. Leg edema Patient taking differently: Take 20 mg by mouth as needed for fluid. 12/30/21   Milford, Anderson Malta, FNP  nitroGLYCERIN (NITROSTAT) 0.4 MG SL tablet Place 1 tablet (0.4 mg total) under the tongue every 5 (five) minutes x 3 doses as needed for chest pain. 10/11/20   Filbert Schilder, NP  ondansetron (ZOFRAN) 4 MG tablet Take 4 mg by mouth 3 (three) times daily as needed for nausea or vomiting. 11/22/20   [provider]  pantoprazole (PROTONIX) 40 MG tablet Take 1 tablet (40 mg total) by mouth 2 (two) times daily. 09/05/22   Bensimhon, Bevelyn Buckles, MD      Allergies    Codeine    Review of Systems   Review of Systems  Physical Exam Updated Vital Signs BP (!) 140/68   Pulse 66   Temp 97.9 F (36.6 C) (Oral)   Resp (!) 26   SpO2 95%  Physical Exam Constitutional:      General: She is not in acute distress. HENT:     Head: Normocephalic and atraumatic.  Eyes:     Conjunctiva/sclera: Conjunctivae normal.     Pupils: Pupils are equal, round, and reactive to light.  Cardiovascular:     Rate and Rhythm: Normal rate. Rhythm irregular.  Pulmonary:     Effort: Pulmonary effort  is normal. No respiratory distress.     Comments: 92% on room air, mild wheezing expiratory bilaterally Abdominal:     General: There is no distension.     Tenderness: There is no abdominal tenderness.  Skin:    General: Skin is warm and dry.  Neurological:     General: No focal deficit present.     Mental Status: She is alert. Mental status is at baseline.  Psychiatric:        Mood and Affect: Mood normal.        Behavior: Behavior normal.     ED Results / Procedures / Treatments   Labs (all labs ordered are listed, but only abnormal results are displayed) Labs Reviewed  BASIC METABOLIC PANEL - Abnormal; Notable for the following components:      Result Value    Creatinine, Ser 1.10 (*)    Calcium 8.6 (*)    GFR, Estimated 52 (*)    All other components within normal limits  CBC WITH DIFFERENTIAL/PLATELET - Abnormal; Notable for the following components:   WBC 12.7 (*)    RBC 3.16 (*)    Hemoglobin 8.6 (*)    HCT 27.6 (*)    RDW 18.3 (*)    Neutro Abs 10.5 (*)    Eosinophils Absolute 0.9 (*)    All other components within normal limits  BRAIN NATRIURETIC PEPTIDE - Abnormal; Notable for the following components:   B Natriuretic Peptide 1,147.8 (*)    All other components within normal limits  SARS CORONAVIRUS 2 BY RT PCR  PROCALCITONIN  LACTATE DEHYDROGENASE  PROTEIN, TOTAL  LACTATE DEHYDROGENASE, PLEURAL OR PERITONEAL FLUID  PROTEIN, PLEURAL OR PERITONEAL FLUID  CYTOLOGY - NON PAP    EKG EKG Interpretation Date/Time:  Wednesday December 27 2022 08:35:06 EDT Ventricular Rate:  66 PR Interval:  156 QRS Duration:  108 QT Interval:  539 QTC Calculation: 565 R Axis:   62  Text Interpretation: Sinus rhythm Nonspecific T abnormalities, diffuse leads Prolonged QT interval Confirmed by Alvester Chou [phone]) on 12/27/2022 8:40:55 AM  Radiology DG Chest 2 View  Result Date: 12/27/2022 CLINICAL DATA:  productive cough - 2 view requested EXAM: CHEST - 2 VIEW COMPARISON:  Chest radiograph 10/28/2022 FINDINGS: Moderate right and small left pleural effusion, increased from prior exam. There is a hazy opacity at the right lung base could represent atelectasis or infection. There is a right paratracheal opacity that likely represents a small amount of pleural fluid unchanged cardiac and mediastinal contours. No radiographically apparent displaced rib fractures. Visualized upper abdomen is unremarkable. Aortic atherosclerotic calcifications IMPRESSION: 1. Moderate right and small left pleural effusion, increased from prior exam. 2. Hazy opacity at the right lung base could represent atelectasis or infection. Electronically Signed   By: Lorenza Cambridge  M.D.   On: 12/27/2022 09:43    Procedures Procedures    Medications Ordered in ED Medications  ipratropium-albuterol (DUONEB) 0.5-2.5 (3) MG/3ML nebulizer solution 3 mL (3 mLs Nebulization Given 12/27/22 1002)  cefTRIAXone (ROCEPHIN) 2 g in sodium chloride 0.9 % 100 mL IVPB (0 g Intravenous Stopped 12/27/22 1141)  doxycycline (VIBRA-TABS) tablet 100 mg (100 mg Oral Given 12/27/22 1005)  ondansetron (ZOFRAN) injection 4 mg (4 mg Intravenous Given 12/27/22 1155)  lidocaine (XYLOCAINE) 1 % (with pres) injection 10 mL (10 mLs Intradermal Given 12/27/22 1312)    ED Course/ Medical Decision Making/ A&P Clinical Course as of 12/27/22 1326  Wed Dec 27, 2022  1227 Admitted to hospitalist [MT]  Clinical Course User Index [MT] Tayonna Bacha, Kermit Balo, MD                                 Medical Decision Making Amount and/or Complexity of Data Reviewed Labs: ordered. Radiology: ordered. ECG/medicine tests: ordered.  Risk Prescription drug management. Decision regarding hospitalization.   This patient presents to the ED with concern for shortness of breath, productive cough. This involves an extensive number of treatment options, and is a complaint that carries with it a high risk of complications and morbidity.  The differential diagnosis includes current pleural effusion versus bacterial pneumonia versus viral URI versus congestive heart failure or pulmonary edema vs COPD versus other  Co-morbidities that complicate the patient evaluation: History congestive heart failure, COPD, at risk of exacerbation  Additional history obtained from EMS  External records from outside source obtained and reviewed including hospital discharge records and summary from July  I ordered and personally interpreted labs.  The pertinent results include:  WBC elevated 12.7, hgb stable anemia at 8.6, covid negative, BNP 1147 (no sig change from prior)  I ordered imaging studies including x-ray of the chest I  independently visualized and interpreted imaging which showed potential infiltrate, small right effusion I agree with the radiologist interpretation  The patient was maintained on a cardiac monitor.  I personally viewed and interpreted the cardiac monitored which showed an underlying rhythm of: NSR  Per my interpretation the patient's ECG shows NSR  I ordered medication including IV antibiotics for CAP  I have reviewed the patients home medicines and have made adjustments as needed  Test Considered: Lower suspicion for acute PE in this clinical setting.   After the interventions noted above, I reevaluated the patient and found that they have: stayed the same  Dispostion:  After consideration of the diagnostic results and the patients response to treatment, I feel that the patent would benefit from medical admission.  Clinically I suspect this is consistent with pneumonia, requiring IV antibiotics and observation in the hospital given her age, frailty and comorbidities.         Final Clinical Impression(s) / ED Diagnoses Final diagnoses:  Pneumonia due to infectious organism, unspecified laterality, unspecified part of lung    Rx / DC Orders ED Discharge Orders     None         Terald Sleeper, MD 12/27/22 1326

## 2022-12-27 NOTE — H&P (Signed)
History and Physical    Patient: Laurie James ZOX:[id number] DOB: Sep 22, 1945 DOA: 12/27/2022 DOS: the patient was seen and examined on 12/27/2022 PCP: Georgann Housekeeper, MD  Patient coming from: Home.  Lives with husband.  Uses furniture to ambulate in house.  Chief Complaint:  Chief Complaint  Patient presents with   Shortness of Breath   Cough   HPI: Laurie James Laurie James is a 77 y.o. female with blindness, diastolic CHF, severe TR, PAF on Eliquis, CAD, COPD, CKD-3 and HTN presenting with productive cough for 2 days.  Patient reports productive cough with whitish phlegm for 2 days.  Reports chest pain with cough.  She denies shortness of breath.  She denies fever or chills.  She reports runny nose that is chronic for her.  Denies sore throat.  She has some nausea but no vomiting, abdominal pain or diarrhea.  Denies UTI symptoms.  Reports compliance with her inhalers.  She reports using 2 inhalers.  She states his son had cough for 3 weeks.  Did another course.  No other sick contacts.  She has over 50-pack-year history of smoking before she quit 2 years ago.  Denies drinking alcohol or recreational drug use.  She is interested in cardiopulmonary sedation in an event of sudden cardiopulmonary arrest.  In ED, stable vitals except for mild tachypnea to 20s.  COVID-19 PCR negative.  BNP elevated to 1100 (about baseline).  Mild leukocytosis to 12.7 with left shift.  Hgb 8.7 (baseline).  CXR with moderate right and small left pleural effusion and possible RLL opacity.  Patient was started on ceftriaxone and doxycycline and hospitalist service called for admission for pneumonia and pleural effusion.    Review of Systems: As mentioned in the history of present illness. All other systems reviewed and are negative. Past Medical History:  Diagnosis Date   AMD (age related macular degeneration)    COPD (chronic obstructive pulmonary disease) (HCC)    Depression    Hiatal hernia     Intermittent low back pain    Memory impairment    Midsternal chest pain    a. 12/2011 Cardiac CTA Ca++ score of 103.3 (80th %), LAD <50p/m, RCA 50-75.   Osteoporosis    PAF (paroxysmal atrial fibrillation) (HCC) 09/2020   Paroxysmal atrial fibrillation with RVR (HCC) 11/28/2021   Vitamin B 12 deficiency    Past Surgical History:  Procedure Laterality Date   ABDOMINAL HYSTERECTOMY     ATRIAL FIBRILLATION ABLATION N/A 03/08/2022   Procedure: ATRIAL FIBRILLATION ABLATION;  Surgeon: Maurice Small, MD;  Location: [facility];  Service: Cardiovascular;  Laterality: N/A;   IR THORACENTESIS ASP PLEURAL SPACE W/IMG GUIDE  10/20/2020   IR THORACENTESIS ASP PLEURAL SPACE W/IMG GUIDE  05/02/2022   IR THORACENTESIS ASP PLEURAL SPACE W/IMG GUIDE  09/07/2022   RIGHT HEART CATH N/A 09/06/2022   Procedure: RIGHT HEART CATH;  Surgeon: Dorthula Nettles, DO;  Location: [facility];  Service: Cardiovascular;  Laterality: N/A;   RIGHT/LEFT HEART CATH AND CORONARY ANGIOGRAPHY N/A 10/07/2020   Procedure: RIGHT/LEFT HEART CATH AND CORONARY ANGIOGRAPHY;  Surgeon: Runell Gess, MD;  Location: [facility];  Service: Cardiovascular;  Laterality: N/A;   TEE WITHOUT CARDIOVERSION N/A 10/25/2020   Procedure: TRANSESOPHAGEAL ECHOCARDIOGRAM (TEE);  Surgeon: Quintella Reichert, MD;  Location: [facility];  Service: Cardiovascular;  Laterality: N/A;   Social History:  reports that she quit smoking about 2 years ago. Her smoking use included cigarettes. She  started smoking about 67 years ago. She has a 65 pack-year smoking history. She has never used smokeless tobacco. She reports that she does not drink alcohol and does not use drugs.  Allergies  Allergen Reactions   Codeine Nausea And Vomiting and Other (See Comments)    Upsets the stomach    Family History  Problem Relation Age of Onset   Breast cancer Sister    Breast cancer Sister     Prior to Admission medications   Medication Sig  Start Date End Date Taking? Authorizing Provider  albuterol (VENTOLIN HFA) 108 (90 Base) MCG/ACT inhaler Inhale 2 puffs into the lungs every 4 (four) hours as needed for wheezing. 06/08/20   [provider]  amiodarone (PACERONE) 200 MG tablet Take 1 tablet (200 mg total) by mouth daily. 10/10/22   Bensimhon, Bevelyn Buckles, MD  apixaban (ELIQUIS) 2.5 MG TABS tablet Take 1 tablet (2.5 mg total) by mouth 2 (two) times daily. 01/17/22   Bensimhon, Bevelyn Buckles, MD  atorvastatin (LIPITOR) 40 MG tablet Take 1 tablet by mouth once daily 09/21/22   Wendall Stade, MD  budesonide-formoterol [facility]) 80-4.5 MCG/ACT inhaler Inhale 2 puffs into the lungs in the morning and at bedtime. 10/30/22   Briant Cedar, MD  cyanocobalamin (,VITAMIN B-12,) 1000 MCG/ML injection Inject 1,000 mcg into the muscle every 30 (thirty) days. 06/11/16   [provider]  furosemide (LASIX) 20 MG tablet Take 1 tablet (20 mg total) by mouth as needed. Leg edema Patient taking differently: Take 20 mg by mouth as needed for fluid. 12/30/21   Milford, Anderson Malta, FNP  nitroGLYCERIN (NITROSTAT) 0.4 MG SL tablet Place 1 tablet (0.4 mg total) under the tongue every 5 (five) minutes x 3 doses as needed for chest pain. 10/11/20   Filbert Schilder, NP  ondansetron (ZOFRAN) 4 MG tablet Take 4 mg by mouth 3 (three) times daily as needed for nausea or vomiting. 11/22/20   [provider]  pantoprazole (PROTONIX) 40 MG tablet Take 1 tablet (40 mg total) by mouth 2 (two) times daily. 09/05/22   Bensimhon, Bevelyn Buckles, MD    Physical Exam: Vitals:   12/27/22 1000 12/27/22 1154 12/27/22 1200 12/27/22 1230  BP:  129/68 124/70 (!) 140/68  Pulse:  65 65 66  Resp:  (!) 27 (!) 23 (!) 26  Temp: 97.9 F (36.6 C)     TempSrc: Oral     SpO2:  98% 96% 95%   GENERAL: No apparent distress.  Nontoxic. HEENT: MMM.  Blind.  Hearing grossly intact. NECK: Supple.  No apparent JVD.  RESP:  No IWOB.  Fair aeration bilaterally.  Rhonchi  bilaterally. CVS:  RRR. Heart sounds normal.  ABD/GI/GU: BS+. Abd soft, NTND.  MSK/EXT:   No apparent deformity. Moves extremities. No edema.  SKIN: no apparent skin lesion or wound NEURO: Awake and alert. Oriented appropriately.  No apparent focal neuro deficit. PSYCH: Calm. Normal affect.   Data Reviewed: See HPI.  Assessment and Plan: Principal Problem:   COPD with acute exacerbation (HCC) Active Problems:   Bilateral pleural effusion   Chronic diastolic CHF (congestive heart failure) (HCC)   PAF (paroxysmal atrial fibrillation) (HCC)   Chronic kidney disease, stage 3a (HCC)   CAD (coronary artery disease)   Tricuspid regurgitation   Community acquired pneumonia   COPD exacerbation/possible community-acquired pneumonia: Patient presents with productive cough for 2 days.  CXR with bilateral pleural effusion and possible RLL infiltrate.  She has mild leukocytosis with  left shift.  COVID-19 PCR nonreactive. -Continue ceftriaxone and doxycycline -Systemic steroid, scheduled and as needed nebulizers -Incentive spirometry  Bilateral pleural effusion: Prior history of this.  X-ray with moderate right and small pleural effusion.  She has history of CHF but appears euvolemic.  Parapneumonic? -IR thoracocentesis with fluid studies. -Check serum LDH and total protein -Antibiotics as above  Chronic diastolic CHF/severe TR: Appears euvolemic on exam.  BNP slightly elevated but about baseline. -Continue home diuretics after med rec -Monitor fluid and respiratory status  Paroxysmal A-fib: Rate controlled.  She is on amiodarone and Eliquis -Continue home meds  CK D-3A: Stable. -Monitor  Blindness/generalized weakness: Ambulates holding to furniture's at home. -Fall precaution -PT/OT eval  GERD -Continue PPI  Hyperlipidemia Continue home statin    Advance Care Planning:   Code Status: Full Code discussed with patient.  Consults: IR  Family Communication: None at  bedside  Severity of Illness: The appropriate patient status for this patient is OBSERVATION. Observation status is judged to be reasonable and necessary in order to provide the required intensity of service to ensure the patient's safety. The patient's presenting symptoms, physical exam findings, and initial radiographic and laboratory data in the context of their medical condition is felt to place them at decreased risk for further clinical deterioration. Furthermore, it is anticipated that the patient will be medically stable for discharge from the hospital within 2 midnights of admission.   Author: Almon Hercules, MD 12/27/2022 1:44 PM  For on call review [email or web address].

## 2022-12-27 NOTE — Procedures (Signed)
PROCEDURE SUMMARY:  Successful image-guided right thoracentesis. Yielded 1.3L of hazy yellow fluid. Pt tolerated procedure well. No immediate complications. EBL = trace   Specimen was  sent for labs. CXR ordered.  Please see imaging section of Epic for full dictation.  Lynann Bologna Recie Cirrincione PA-C 12/27/2022 2:55 PM

## 2022-12-28 DIAGNOSIS — J441 Chronic obstructive pulmonary disease with (acute) exacerbation: Secondary | ICD-10-CM | POA: Diagnosis not present

## 2022-12-28 MED ORDER — ENSURE ENLIVE PO LIQD
237.0000 mL | Freq: Two times a day (BID) | ORAL | Status: DC
  Administered 2022-12-28 – 2022-12-29 (×2): 237 mL via ORAL

## 2022-12-28 MED ORDER — AZITHROMYCIN 500 MG PO TABS
500.0000 mg | ORAL_TABLET | Freq: Every day | ORAL | Status: DC
  Administered 2022-12-28 – 2022-12-29 (×2): 500 mg via ORAL
  Filled 2022-12-28 (×2): qty 1

## 2022-12-28 MED ORDER — IPRATROPIUM-ALBUTEROL 0.5-2.5 (3) MG/3ML IN SOLN
3.0000 mL | Freq: Two times a day (BID) | RESPIRATORY_TRACT | Status: DC
  Administered 2022-12-29: 3 mL via RESPIRATORY_TRACT
  Filled 2022-12-28: qty 3

## 2022-12-28 NOTE — Progress Notes (Signed)
Initial Nutrition Assessment  DOCUMENTATION CODES:   Not applicable  INTERVENTION:  Continue regular diet as ordered Ensure Enlive po BID, each supplement provides 350 kcal and 20 grams of protein. Magic cup BID with meals, each supplement provides 290 kcal and 9 grams of protein  NUTRITION DIAGNOSIS:   Increased nutrient needs related to chronic illness (COPD) as evidenced by estimated needs.  GOAL:   Patient will meet greater than or equal to 90% of their needs  MONITOR:   PO intake, Supplement acceptance, Labs, Weight trends  REASON FOR ASSESSMENT:   Consult Assessment of nutrition requirement/status  ASSESSMENT:   Pt admitted with SOB and cough d/t COPD exacerbation. PMH significant for blindness, diastolic CHF, severe TR, PAF on Eliquis, CAD, COPD, CKD 3, HTN.   9/18: s/p R sided thoracentesis- yield 1.3L   Spoke with pt and her daughter, Laurie James, at bedside. Pt reports that overall her appetite has been doing well and is at baseline. She states that on occasion if her appetite is poor, it's d/t difficulty breathing.   She eats a late breakfast around 10am which consists of rice krispies with sugar and milk. She snacks throughout the day on items such as little debbie cakes. Dinner varies but includes soft textured foods such as butter noodles, meatloaf or various types of "country" cooking. She only tries to limit her sodium intake, otherwise denies other dietary restrictions. Pt consumes little protein, fruits and vegetables unless well cooked d/t difficulty with chewing and digestion. She is edentulous and does not always wear her dentures.   Pt prefers to order her own meals with the assistance of family/nursing staff. Pt consuming chicken tenders and french fries at time of visit.   She is familiar with protein supplements but does not consume them regularly at home. Her family recently purchased them for her but she has not consumed any as of yet. She is agreeable to  receiving during admission. Encouraged intake of at least 1 per day at home after discharge d/t increased nutrition needs in the setting of COPD. She does not take MVI as they cause indigestion.   Pt reports that her weight has remained stable at 108 lbs over the last several months. Per review of documented weight history, pt's weight over the last year has fluctuated between 47-50 kg. Uncertain whether current weight is actual versus stated. Will continue to monitor trends throughout admission.   Medication: abx, prednisone, protonix, IV abx  Labs: reviewed  NUTRITION - FOCUSED PHYSICAL EXAM: Pt does not currently meet criteria for malnutrition based on dietary recall and stable weight however in the setting of multiple co-morbidities, pt is at high risk for malnutrition.  Flowsheet Row Most Recent Value  Orbital Region No depletion  Upper Arm Region Mild depletion  Thoracic and Lumbar Region No depletion  Buccal Region No depletion  Temple Region Mild depletion  Clavicle Bone Region Mild depletion  Clavicle and Acromion Bone Region Mild depletion  Scapular Bone Region No depletion  Dorsal Hand Moderate depletion  Patellar Region Moderate depletion  Anterior Thigh Region Moderate depletion  Posterior Calf Region Mild depletion  Edema (RD Assessment) None  Hair Reviewed  Eyes Other (Comment)  [bilateral blindness]  Mouth Other (Comment)  [edentulous]  Skin Other (Comment)  [very thin skin per pt]  Nails Reviewed      Diet Order:   Diet Order             Diet regular Room service appropriate? Yes; Fluid consistency:  Thin  Diet effective now                   EDUCATION NEEDS:   Education needs have been addressed  Skin:  Skin Assessment: Reviewed RN Assessment  Last BM:  PTA  Height:   Ht Readings from Last 1 Encounters:  10/28/22 4\' 8"  (1.422 m)    Weight:   Wt Readings from Last 1 Encounters:  12/28/22 49.8 kg   BMI:  Body mass index is 24.61  kg/m.  Estimated Nutritional Needs:   Kcal:  1300-1500  Protein:  65-80g  Fluid:  1.3-1.5L  Laurie James, RDN, LDN Clinical Nutrition

## 2022-12-28 NOTE — Progress Notes (Addendum)
PROGRESS NOTE  Caramia Fettig Launa Winkelman  OZH:[id number] DOB: 1945-05-28 DOA: 12/27/2022 PCP: Georgann Housekeeper, MD   Brief Narrative: Patient is a 77 year old female with blindness, diastolic CHF, severe TR, paroxysmal A-fib, coronary artery disease, COPD, CKD stage III, hypertension who presented with cough, shortness of breath for 2 days.  Reported cough for last 3 weeks.  Has 50-pack-year history of smoking before she quit 2 years ago.  On presentation she was mildly tachypneic.  COVID screen test was negative.  Elevated BNP.  Lab work showed mild leukocytosis of 12.7, hemoglobin of 8.7.  Chest x-ray showed moderate right and small left pleural effusion and possible right lower lobe opacity.  Patient was started on antibiotics.  Underwent right-sided thoracentesis on 9/18.  Assessment & Plan:  Principal Problem:   COPD with acute exacerbation (HCC) Active Problems:   Bilateral pleural effusion   Chronic diastolic CHF (congestive heart failure) (HCC)   PAF (paroxysmal atrial fibrillation) (HCC)   Chronic kidney disease, stage 3a (HCC)   CAD (coronary artery disease)   Tricuspid regurgitation   Community acquired pneumonia   COPD exacerbation/possible community-acquired pneumonia: Presented with cough, shortness of breath.  Started on azithromycin, ceftriaxone.  COVID screen test negative.  Also on steroid for COPD exacerbation.  Continue incentive spirometer bronchodilators.  This morning she feels significantly better.  On room air.  No wheezing today.  Bilateral pleural effusion: Has history of this.  X-ray showed moderate right and small left pleural effusion.  Underwent right-sided thoracentesis by radiology with removal of 1.3 pleural fluid.  Protein is 4, but this is mostly likely transudative secondary to severe TR. will follow-up cytology.  Remains on room air this morning  Chronic diastolic CHF/severe TR: Appears euvolemic.  BNP is elevated.  Continue home diuretics.  Paroxysmal  A-fib: Currently in normal sinus rhythm.  Currently on Eliquis and amiodarone.  CKD stage IIIa: Currently renal function baseline  Blindness/generalized weakness: PT/OT requested, no follow-up recommended  GERD: Continue PPI  Hyperlipidemia: Continue statin          DVT prophylaxis:apixaban (ELIQUIS) tablet 2.5 mg Start: 12/27/22 2200 apixaban (ELIQUIS) tablet 2.5 mg     Code Status: Full Code  Family Communication: Daughter at bedside  Patient status: obs  Patient is from :Home  Anticipated discharge GE:XBMW  Estimated DC date:tomorrow   Consultants: None  Procedures:None  Antimicrobials:  Anti-infectives (From admission, onward)    Start     Dose/Rate Route Frequency Ordered Stop   12/28/22 1000  cefTRIAXone (ROCEPHIN) 2 g in sodium chloride 0.9 % 100 mL IVPB        2 g 200 mL/hr over 30 Minutes Intravenous Every 24 hours 12/27/22 1337 01/01/23 0959   12/28/22 1000  doxycycline (VIBRA-TABS) tablet 100 mg        100 mg Oral Every 12 hours 12/27/22 1345 01/01/23 0959   12/28/22 0000  azithromycin (ZITHROMAX) 500 mg in sodium chloride 0.9 % 250 mL IVPB  Status:  Discontinued        500 mg 250 mL/hr over 60 Minutes Intravenous Every 24 hours 12/27/22 1337 12/27/22 1345   12/27/22 1000  cefTRIAXone (ROCEPHIN) 2 g in sodium chloride 0.9 % 100 mL IVPB        2 g 200 mL/hr over 30 Minutes Intravenous  Once 12/27/22 0946 12/27/22 1141   12/27/22 1000  doxycycline (VIBRA-TABS) tablet 100 mg        100 mg Oral  Once 12/27/22 0946 12/27/22 1005  Subjective: Patient seen and examined at bedside today.  Hemodynamically stable.  On room air today.  Not coughing or appeared short of breath.  Feels much better.  Eager to go home  Objective: Vitals:   12/27/22 1935 12/27/22 2316 12/28/22 0342 12/28/22 0343  BP: (!) 110/53 (!) 120/54  (!) 117/59  Pulse: 72 71  67  Resp: 18 18  18   Temp: 99.3 F (37.4 C) 99 F (37.2 C)  98.8 F (37.1 C)  TempSrc: Oral Oral   Oral  SpO2: 98% 99%  98%  Weight:   49.8 kg    No intake or output data in the 24 hours ending 12/28/22 0819 Filed Weights   12/28/22 0342  Weight: 49.8 kg    Examination:  General exam: Overall comfortable, not in distress, elderly pleasant female HEENT: PERRL Respiratory system:  no wheezes or crackles , diminished sounds on bases Cardiovascular system: S1 & S2 heard, RRR.  Gastrointestinal system: Abdomen is nondistended, soft and nontender. Central nervous system: Alert and oriented Extremities: No edema, no clubbing ,no cyanosis Skin: No rashes, no ulcers,no icterus     Data Reviewed: I have personally reviewed following labs and imaging studies  CBC: Recent Labs  Lab 12/27/22 1019  WBC 12.7*  NEUTROABS 10.5*  HGB 8.6*  HCT 27.6*  MCV 87.3  PLT 313   Basic Metabolic Panel: Recent Labs  Lab 12/27/22 1019  NA 138  K 3.5  CL 105  CO2 22  GLUCOSE 95  BUN 23  CREATININE 1.10*  CALCIUM 8.6*     Recent Results (from the past 240 hour(s))  SARS Coronavirus 2 by RT PCR (hospital order, performed in [facility] hospital lab) *cepheid single result test* Anterior Nasal Swab     Status: None   Collection Time: 12/27/22  8:34 AM   Specimen: Anterior Nasal Swab  Result Value Ref Range Status   SARS Coronavirus 2 by RT PCR NEGATIVE NEGATIVE Final    Comment: Performed at [facility], [address], [place], [place] [address]     Radiology Studies: IR THORACENTESIS ASP PLEURAL SPACE W/IMG GUIDE  Result Date: 12/27/2022 INDICATION: 77 year old female with recurrent right-sided pleural effusion due to heart failure presents with shortness of breath. Previous chest x-ray showed enlarging right pleural effusion. Request for therapeutic and diagnostic thoracentesis. EXAM: ULTRASOUND GUIDED RIGHT THORACENTESIS MEDICATIONS: 5 mL 1% lidocaine COMPLICATIONS: None immediate. PROCEDURE: An ultrasound guided thoracentesis was thoroughly discussed with the patient and  questions answered. The benefits, risks, alternatives and complications were also discussed. The patient understands and wishes to proceed with the procedure. Written consent was obtained. Ultrasound was performed to localize and mark an adequate pocket of fluid in the right chest. The area was then prepped and draped in the normal sterile fashion. 1% Lidocaine was used for local anesthesia. Under ultrasound guidance a 6 Fr Safe-T-Centesis catheter was introduced. Thoracentesis was performed. The catheter was removed and a dressing applied. FINDINGS: A total of approximately 1.3 L of hazy yellow fluid was removed. Samples were sent to the laboratory as requested by the clinical team. Post procedure chest X-ray reviewed, negative for pneumothorax. IMPRESSION: Successful ultrasound guided right thoracentesis yielding 1.3 L of pleural fluid. Performed by: Lawernce Ion, PA-C Electronically Signed   By: Gilmer Mor D.O.   On: 12/27/2022 15:11   DG Chest 1 View  Result Date: 12/27/2022 CLINICAL DATA:  Status post right thoracentesis EXAM: CHEST  1 VIEW COMPARISON:  Chest radiograph dated 12/27/2022  at 9:04 a.m. FINDINGS: Normal lung volumes. No focal consolidations. Decreased trace right pleural effusion. Similar trace left pleural effusion. No pneumothorax. Similar cardiomediastinal silhouette. No acute osseous abnormality. IMPRESSION: Decreased trace right pleural effusion status post thoracentesis. No pneumothorax. Electronically Signed   By: Agustin Cree M.D.   On: 12/27/2022 14:30   DG Chest 2 View  Result Date: 12/27/2022 CLINICAL DATA:  productive cough - 2 view requested EXAM: CHEST - 2 VIEW COMPARISON:  Chest radiograph 10/28/2022 FINDINGS: Moderate right and small left pleural effusion, increased from prior exam. There is a hazy opacity at the right lung base could represent atelectasis or infection. There is a right paratracheal opacity that likely represents a small amount of pleural fluid unchanged  cardiac and mediastinal contours. No radiographically apparent displaced rib fractures. Visualized upper abdomen is unremarkable. Aortic atherosclerotic calcifications IMPRESSION: 1. Moderate right and small left pleural effusion, increased from prior exam. 2. Hazy opacity at the right lung base could represent atelectasis or infection. Electronically Signed   By: Lorenza Cambridge M.D.   On: 12/27/2022 09:43    Scheduled Meds:  amiodarone  200 mg Oral Daily   apixaban  2.5 mg Oral BID   atorvastatin  40 mg Oral Daily   doxycycline  100 mg Oral Q12H   ipratropium-albuterol  3 mL Nebulization Q8H   levothyroxine  75 mcg Oral QAC breakfast   pantoprazole  40 mg Oral BID   predniSONE  40 mg Oral Q breakfast   Continuous Infusions:  cefTRIAXone (ROCEPHIN)  IV       LOS: 0 days   Burnadette Pop, MD [facility] P9/19/2024, 8:19 AM

## 2022-12-28 NOTE — Evaluation (Signed)
Physical Therapy Evaluation Patient Details Name: Laurie James MRN: [id number] DOB: 09-15-1945 Today's Date: 12/28/2022  History of Present Illness  Pt is a 77 y.o. female presenting 9/18 with 2 day history or weakness. CXR with moderate right and small left pleural effusion and possible RLL opacity. S/p R thoracentesis. PMH significant for blindness, diastolic CHF, severe TR, PAF on Eliquis, CAD, COPD, CKD-3 and HTN.  Clinical Impression  Pt admitted with above diagnosis. Pt from home with husband and children check on them regularly. Pt fatigues very quickly with exertion with SPO2 remaining 97% on RA, HR 97 bpm, noted 3/4 DOE but no wheezing. Pt encouraged to use RW for energy conservation and she was agreeable on eval but does not prefer to use one at home. She does have one at home if needed. Will follow acutely but no PT needed at d/c.  Pt currently with functional limitations due to the deficits listed below (see PT Problem List). Pt will benefit from acute skilled PT to increase their independence and safety with mobility to allow discharge.           If plan is discharge home, recommend the following: Assistance with cooking/housework;A little help with bathing/dressing/bathroom;Assist for transportation;Help with stairs or ramp for entrance   Can travel by private vehicle        Equipment Recommendations None recommended by PT  Recommendations for Other Services       Functional Status Assessment Patient has not had a recent decline in their functional status     Precautions / Restrictions Precautions Precautions: Other (comment) Precaution Comments: pt is legally blind (can see shadows) Restrictions Weight Bearing Restrictions: No      Mobility  Bed Mobility Overal bed mobility: Modified Independent             General bed mobility comments: increased time    Transfers Overall transfer level: Modified independent Equipment used: Rolling walker  (2 wheels)               General transfer comment: vc's for pushing up from bed    Ambulation/Gait Ambulation/Gait assistance: Supervision Gait Distance (Feet): 80 Feet Assistive device: Rolling walker (2 wheels) Gait Pattern/deviations: Step-through pattern, Trunk flexed Gait velocity: decreased Gait velocity interpretation: >2.62 ft/sec, indicative of community ambulatory   General Gait Details: encouraged pt to use RW just to "warm up" and then push to side but once pt ambulating with RW she didn't want to give it up, despite the fact that she does not want to use one at home. Pt with 3/4 DOE by end of 80'. O2 sats 97% on RA, HR 97 bpm  Stairs            Wheelchair Mobility     Tilt Bed    Modified Rankin (Stroke Patients Only)       Balance Overall balance assessment: Needs assistance Sitting-balance support: No upper extremity supported, Feet supported Sitting balance-Leahy Scale: Good     Standing balance support: No upper extremity supported Standing balance-Leahy Scale: Fair Standing balance comment: unable to accept perturbation in standing without UE support                             Pertinent Vitals/Pain Pain Assessment Pain Assessment: No/denies pain    Home Living Family/patient expects to be discharged to:: Private residence Living Arrangements: Spouse/significant other;Children Available Help at Discharge: Family;Available 24 hours/day Type of Home: House Home  Access: Stairs to enter Entrance Stairs-Rails: None (pt states she has bilateral columns she can hold onto) Entrance Stairs-Number of Steps: 3   Home Layout: Two level;Able to live on main level with bedroom/bathroom Home Equipment: Rolling Walker (2 wheels);Cane - single point;Shower seat;Wheelchair - manual;Grab bars - tub/shower;Hospital bed Additional Comments: loves with her husband, daughter makes meals and brings them over and cleans house and son lives nearby     Prior Function Prior Level of Function : Needs assist             Mobility Comments: reports ind, relatively sedentary household distances only; husband or family with her for going onto porch and all other out of house mobility ADLs Comments: Husband assists with shower transfers and occasionally with dressing. Husband cooks/cleans but pt does laundry and dishes     Extremity/Trunk Assessment   Upper Extremity Assessment Upper Extremity Assessment: Defer to OT evaluation    Lower Extremity Assessment Lower Extremity Assessment: Generalized weakness (fatigues quickly with all activity)    Cervical / Trunk Assessment Cervical / Trunk Assessment: Kyphotic  Communication   Communication Communication: No apparent difficulties Cueing Techniques: Verbal cues  Cognition Arousal: Alert Behavior During Therapy: WFL for tasks assessed/performed Overall Cognitive Status: Within Functional Limits for tasks assessed                                 General Comments: WFL for basic ADL. executive function not formally assessed. Pt very resistant to using any AD, states she is not old enough        General Comments General comments (skin integrity, edema, etc.): educated on use of IS as well as mobilizing each hour once home    Exercises     Assessment/Plan    PT Assessment Patient needs continued PT services  PT Problem List Cardiopulmonary status limiting activity;Decreased balance;Decreased mobility;Decreased activity tolerance       PT Treatment Interventions DME instruction;Gait training;Stair training;Functional mobility training;Therapeutic activities;Therapeutic exercise;Balance training;Patient/family education    PT Goals (Current goals can be found in the Care Plan section)  Acute Rehab PT Goals Patient Stated Goal: return home, breathe better PT Goal Formulation: With patient Time For Goal Achievement: 01/11/23 Potential to Achieve Goals:  Good    Frequency Min 3X/week     Co-evaluation               AM-PAC PT "6 Clicks" Mobility  Outcome Measure Help needed turning from your back to your side while in a flat bed without using bedrails?: None Help needed moving from lying on your back to sitting on the side of a flat bed without using bedrails?: None Help needed moving to and from a bed to a chair (including a wheelchair)?: None Help needed standing up from a chair using your arms (e.g., wheelchair or bedside chair)?: None Help needed to walk in hospital room?: A Little Help needed climbing 3-5 steps with a railing? : A Lot 6 Click Score: 21    End of Session Equipment Utilized During Treatment: Gait belt Activity Tolerance: Patient tolerated treatment well Patient left: in bed;with call bell/phone within reach;with family/visitor present Nurse Communication: Mobility status PT Visit Diagnosis: Unsteadiness on feet (R26.81)    Time: 2130-8657 PT Time Calculation (min) (ACUTE ONLY): 18 min   Charges:   PT Evaluation $PT Eval Moderate Complexity: 1 Mod   PT General Charges $$ ACUTE PT VISIT: 1 Visit  Lyanne Co, PT  Acute Rehab Services Secure chat preferred Office [phone]   Elyse Hsu 12/28/2022, 10:46 AM

## 2022-12-28 NOTE — Evaluation (Signed)
Occupational Therapy Evaluation Patient Details Name: Laurie James MRN: [id number] DOB: 11-May-1945 Today's Date: 12/28/2022   History of Present Illness Pt is a 77 y.o. female presenting 9/18 with 2 day history or weakness. CXR with moderate right and small left pleural effusion and possible RLL opacity. S/p R thoracentesis. PMH significant for blindness, diastolic CHF, severe TR, PAF on Eliquis, CAD, COPD, CKD-3 and HTN.   Clinical Impression   PTA, pt lived with husband who provided intermittent assist with ADL due to her low vision and decr activity tolerance. Upon eval, pt presents with min decreased activity tolerance, but she reports much improved since admission and medical management. Pt performing BADL with supervision for intermittent cueing due to low vision and not in natural environment. Pt educated regarding importance of frequent mobility, and verbalizing understanding. Very pleasant and conversational throughout. Recommending home with no OT follow up and husband to assist as needed. OT to sign off. Thank you for this order.       If plan is discharge home, recommend the following: A little help with walking and/or transfers;A lot of help with bathing/dressing/bathroom;Direct supervision/assist for financial management;Direct supervision/assist for medications management;Assistance with cooking/housework;Assist for transportation;Help with stairs or ramp for entrance    Functional Status Assessment  Patient has had a recent decline in their functional status and demonstrates the ability to make significant improvements in function in a reasonable and predictable amount of time.  Equipment Recommendations  None recommended by OT    Recommendations for Other Services       Precautions / Restrictions Precautions Precautions: Other (comment) Precaution Comments: pt is legally blind (can see shadows) Restrictions Weight Bearing Restrictions: No      Mobility  Bed Mobility Overal bed mobility: Modified Independent             General bed mobility comments: increased time    Transfers Overall transfer level: Modified independent                        Balance Overall balance assessment: Mild deficits observed, not formally tested                                         ADL either performed or assessed with clinical judgement   ADL Overall ADL's : Needs assistance/impaired                                       General ADL Comments: Increased time; supervision for hospital environment due to low vision only. Able to mobilize in room with cues for spatial obstacles.     Vision Baseline Vision/History: 2 Legally blind Ability to See in Adequate Light: 3 Highly impaired Patient Visual Report: No change from baseline Additional Comments: legally blind at baseline; can see shadows; able to track therapist in room.     Perception         Praxis         Pertinent Vitals/Pain Pain Assessment Pain Assessment: No/denies pain     Extremity/Trunk Assessment Upper Extremity Assessment Upper Extremity Assessment: Generalized weakness   Lower Extremity Assessment Lower Extremity Assessment: Defer to PT evaluation   Cervical / Trunk Assessment Cervical / Trunk Assessment: Normal   Communication Communication Communication: No apparent difficulties   Cognition  Arousal: Alert Behavior During Therapy: WFL for tasks assessed/performed Overall Cognitive Status: Within Functional Limits for tasks assessed                                 General Comments: WFL for basic ADL. executive function not formally assessed     General Comments  VSS. Pt coughing up phlegm; educated regarding importance of frequent mobility    Exercises     Shoulder Instructions      Home Living Family/patient expects to be discharged to:: Private residence Living Arrangements:  Spouse/significant other;Children Available Help at Discharge: Family;Available 24 hours/day Type of Home: House Home Access: Stairs to enter [employer] of Steps: 3 Entrance Stairs-Rails: None (pt states she has bilateral columns she can hold onto) Home Layout: Two level;Able to live on main level with bedroom/bathroom     Bathroom Shower/Tub: Producer, television/film/video: Standard     Home Equipment: Agricultural consultant (2 wheels);Cane - single point;Shower seat;Wheelchair - manual;Grab bars - tub/shower;Hospital bed          Prior Functioning/Environment Prior Level of Function : Needs assist             Mobility Comments: reports ind, relatively sedentary household distances only; husband or family with her for going onto porch and all other out of house mobility ADLs Comments: Husband assists with shower transfers and occasionally with dressing. Husband cooks/cleans but pt does laundry and dishes        OT Problem List: Decreased strength;Decreased activity tolerance;Impaired balance (sitting and/or standing);Impaired vision/perception;Cardiopulmonary status limiting activity      OT Treatment/Interventions:      OT Goals(Current goals can be found in the care plan section) Acute Rehab OT Goals Patient Stated Goal: go home OT Goal Formulation: With patient  OT Frequency:      Co-evaluation              AM-PAC OT "6 Clicks" Daily Activity     Outcome Measure Help from another person eating meals?: A Little (supervision for hospital environment) Help from another person taking care of personal grooming?: A Little Help from another person toileting, which includes using toliet, bedpan, or urinal?: A Little Help from another person bathing (including washing, rinsing, drying)?: A Little Help from another person to put on and taking off regular upper body clothing?: A Little Help from another person to put on and taking off regular lower body  clothing?: A Little 6 Click Score: 18   End of Session Nurse Communication: Mobility status  Activity Tolerance: Patient tolerated treatment well Patient left: in bed;with call bell/phone within reach  OT Visit Diagnosis: Unsteadiness on feet (R26.81);Muscle weakness (generalized) (M62.81);Low vision, both eyes (H54.2)                Time: 1610-9604 OT Time Calculation (min): 21 min Charges:  OT General Charges $OT Visit: 1 Visit OT Evaluation $OT Eval Low Complexity: 1 Low  Laurie James, OTR/L [facility] Acute Rehabilitation Office: [phone]   Laurie James 12/28/2022, 8:46 AM

## 2022-12-28 NOTE — Care Management Obs Status (Signed)
MEDICARE OBSERVATION STATUS NOTIFICATION   Patient Details  Name: Melisia Sielaff MRN: [id number] Date of Birth: [birth year]   Medicare Observation Status Notification Given:  Yes    Gordy Clement, RN 12/28/2022, 1:32 PM

## 2022-12-28 NOTE — Plan of Care (Signed)
  Problem: Education: Goal: Understanding of disease, treatment, and recovery process will improve Outcome: Progressing   Problem: Activity: Goal: Ability to return to baseline activity level will improve Outcome: Progressing   Problem: Cardiac: Goal: Ability to maintain adequate cardiovascular perfusion will improve Outcome: Progressing Goal: Vascular access site(s) Level 0-1 will be maintained Outcome: Progressing   Problem: Health Behavior/ Discharge Planning: Goal: Ability to safely manage health related needs after discharge Outcome: Progressing

## 2022-12-29 DIAGNOSIS — J441 Chronic obstructive pulmonary disease with (acute) exacerbation: Secondary | ICD-10-CM | POA: Diagnosis not present

## 2022-12-29 LAB — CBC
HCT: 25.9 % — ABNORMAL LOW (ref 36.0–46.0)
Hemoglobin: 7.8 g/dL — ABNORMAL LOW (ref 12.0–15.0)
MCH: 25.7 pg — ABNORMAL LOW (ref 26.0–34.0)
MCHC: 30.1 g/dL (ref 30.0–36.0)
MCV: 85.5 fL (ref 80.0–100.0)
Platelets: 329 10*3/uL (ref 150–400)
RBC: 3.03 MIL/uL — ABNORMAL LOW (ref 3.87–5.11)
RDW: 18.5 % — ABNORMAL HIGH (ref 11.5–15.5)
WBC: 14.3 10*3/uL — ABNORMAL HIGH (ref 4.0–10.5)
nRBC: 0 % (ref 0.0–0.2)

## 2022-12-29 LAB — CYTOLOGY - NON PAP

## 2022-12-29 MED ORDER — CEFDINIR 300 MG PO CAPS: 300.0000 mg | ORAL_CAPSULE | Freq: Two times a day (BID) | ORAL | 0 refills | Status: AC

## 2022-12-29 MED ORDER — AZITHROMYCIN 500 MG PO TABS: 500.0000 mg | ORAL_TABLET | Freq: Every day | ORAL | 0 refills | Status: AC

## 2022-12-29 MED ORDER — GUAIFENESIN ER 600 MG PO TB12: 600.0000 mg | ORAL_TABLET | Freq: Two times a day (BID) | ORAL | 0 refills | Status: DC

## 2022-12-29 MED ORDER — PREDNISONE 20 MG PO TABS: 40.0000 mg | ORAL_TABLET | Freq: Every day | ORAL | 0 refills | Status: AC

## 2022-12-29 NOTE — Progress Notes (Signed)
Advanced Heart Failure Clinic Note    PCP: Georgann Housekeeper, MD Primary Cardiologist: Charlton Haws, MD  HF Cardiologist: Dr. Gala Romney  HPI: Laurie James is a 78 y.o. woman with severe COPD, blindness, malnutrition/frailty, HTN, systolic HF due to NICM (diagnosed 7/22) and atrial fibrillation.   Admitted 6/22 with new onset AF. EF normal and RV dilated. Cath with nonobstructive CAD. Sent home with [facility] and rate control w/ plans for outpatient DCCV after 3 weeks of a/c.   Readmitted 7/22 with acute HF -> cardiogenic shock. TEE EF 20% RV moderately down RA massively dilated. Heavy smoke in RA/LA and aortic root. DCCV canceled. Initial Co-ox 38% and required milrinone. Had right sided thoracentesis on 07/13 and subsequent CXRs showed some reaccumulation. Diuresed with IV lasix. Po lasix PRN at discharge.  D/c weight 99 lbs.  Echo 03/21/21: EF 55-60% RV dilated with normal function. Severe TR.   Admitted 8/23 with afib RVR in the 150's. Echo 8/23  showed EF: 40-45%, RV function mildly reduced. Treated with amio.   AF ablation 03/08/22   Seen 03/31/22  in ED for mild hemoptysis. CT chest ok. Now resolved.   1/24 R thoracentesis   Echo (05/31/22): EF 50-55% severe TR  Admitted 5/24 with ADHF and recurrent R pleural effusion. Underwent R thora 900cc - transudative).  RHC 09/05/22 RA 4 PA 43/10 (21) PCWP 11 PVR 6.5 Fick 2.6/1.9  Follow up 09/26/22 with Dr. Gala Romney, NYHA III, volume OK on Lasix 20 mg PRN. Struggled tolerating GDMT with AKI, so plan to remain off unless EF declines.  Admitted 7/24 with a/c dHF. BNP 1045, diuresed with IV lasix. Lt echo showed EF 55-60%, G2DD, RV ok. Severe TR. S/p R thora 10/28/22--> 1L fluid. She was discharged home, weight 105 lbs.  Today she returns for post hospital HF follow up with her granddaughter. Overall feeling fine. She is able to get around her house and do basic ADLs, remains weak. She is legally blind. Denies palpitations, CP, dizziness, edema, or  PND/Orthopnea. Appetite ok. No fever or chills. She does not weigh at home. Taking all medications, she has restarted her Synthroid. Takes lasix rarely. Scrapped her forearm and has a hematoma. Saw PCP who stopped AC for several days and applied compression, swelling has gone down some.   Cardiac Studies: - Ltd Echo (7/24): EF 55-60%, G2DD, RV ok, severe TR.  - RHC (5/24): RA 4 PA 43/10 (21) PCWP 11 PVR 6.5 Fick 2.6/1.9  - Echo (2/24): EF 50-55%, severe TR  - Echo (8/23): EF 40-45%, grade II DD, RV mildly reduced, moderate TR  - Echo (6/23): EF 45-50%, RV ok, severe TR  - Echo (10/06/20): EF 55-60 %   - LHC (10/07/20) showed mild 40% segmental RCA stenosis as well as 50 to 60% second diagonal branch stenosis  RHC 6/22 RA 3 PA 17/2 (8) PCWP 7 PVR  0.25 WU Fick 2.6/2.0 (LVEF was low)  - TEE (7/22): EF 20% RV moderately down RA massively dilated. Heavy smoke in RA/LA and aortic root.   Past Medical History:  Diagnosis Date   AMD (age related macular degeneration)    COPD (chronic obstructive pulmonary disease) (HCC)    Depression    Hiatal hernia    Intermittent low back pain    Memory impairment    Midsternal chest pain    a. 12/2011 Cardiac CTA Ca++ score of 103.3 (80th %), LAD <50p/m, RCA 50-75.   Osteoporosis    PAF (paroxysmal atrial fibrillation) (HCC) 09/2020  Paroxysmal atrial fibrillation with RVR (HCC) 11/28/2021   Vitamin B 12 deficiency    No current facility-administered medications for this visit.   Current Outpatient Medications  Medication Sig Dispense Refill   [START ON 12/30/2022] azithromycin (ZITHROMAX) 500 MG tablet Take 1 tablet (500 mg total) by mouth daily for 3 days. 3 tablet 0   [START ON 12/30/2022] cefdinir (OMNICEF) 300 MG capsule Take 1 capsule (300 mg total) by mouth 2 (two) times daily for 2 days. 4 capsule 0   guaiFENesin (MUCINEX) 600 MG 12 hr tablet Take 1 tablet (600 mg total) by mouth 2 (two) times daily for 7 days. 14 tablet 0   [START  ON 12/30/2022] predniSONE (DELTASONE) 20 MG tablet Take 2 tablets (40 mg total) by mouth daily with breakfast for 3 days. 6 tablet 0   Facility-Administered Medications Ordered in Other Visits  Medication Dose Route Frequency Provider Last Rate Last Admin   acetaminophen (TYLENOL) tablet 650 mg  650 mg Oral Q6H PRN Almon Hercules, MD       Or   acetaminophen (TYLENOL) suppository 650 mg  650 mg Rectal Q6H PRN Candelaria Stagers T, MD       amiodarone (PACERONE) tablet 200 mg  200 mg Oral Daily Candelaria Stagers T, MD   200 mg at 12/29/22 0950   apixaban (ELIQUIS) tablet 2.5 mg  2.5 mg Oral BID Candelaria Stagers T, MD   2.5 mg at 12/29/22 0949   atorvastatin (LIPITOR) tablet 40 mg  40 mg Oral Daily Candelaria Stagers T, MD   40 mg at 12/29/22 0949   azithromycin (ZITHROMAX) tablet 500 mg  500 mg Oral Daily Burnadette Pop, MD   500 mg at 12/29/22 0950   cefTRIAXone (ROCEPHIN) 2 g in sodium chloride 0.9 % 100 mL IVPB  2 g Intravenous Q24H Candelaria Stagers T, MD   Stopped at 12/29/22 1124   feeding supplement (ENSURE ENLIVE / ENSURE PLUS) liquid 237 mL  237 mL Oral BID BM Adhikari, Amrit, MD   237 mL at 12/29/22 1001   ipratropium-albuterol (DUONEB) 0.5-2.5 (3) MG/3ML nebulizer solution 3 mL  3 mL Nebulization Q2H PRN Gonfa, Taye T, MD       ipratropium-albuterol (DUONEB) 0.5-2.5 (3) MG/3ML nebulizer solution 3 mL  3 mL Nebulization BID Burnadette Pop, MD   3 mL at 12/29/22 0745   levothyroxine (SYNTHROID) tablet 75 mcg  75 mcg Oral QAC breakfast Candelaria Stagers T, MD   75 mcg at 12/29/22 0537   pantoprazole (PROTONIX) EC tablet 40 mg  40 mg Oral BID Candelaria Stagers T, MD   40 mg at 12/29/22 0949   predniSONE (DELTASONE) tablet 40 mg  40 mg Oral Q breakfast Candelaria Stagers T, MD   40 mg at 12/29/22 6213   Allergies  Allergen Reactions   Codeine Nausea And Vomiting and Other (See Comments)    Upsets the stomach   Social History   Socioeconomic History   Marital status: Married    Spouse name: Not on file   Number of children: Not on  file   Years of education: Not on file   Highest education level: Not on file  Occupational History   Occupation: retired  Tobacco Use   Smoking status: Former    Current packs/day: 0.00    Average packs/day: 1 pack/day for 65.0 years (65.0 ttl pk-yrs)    Types: Cigarettes    Start date: 09/1955    Quit date: 09/2020    Years since quitting:  2.3   Smokeless tobacco: Never   Tobacco comments:    Former smoker 04/04/22  Vaping Use   Vaping status: Never Used  Substance and Sexual Activity   Alcohol use: No    Alcohol/week: 0.0 standard drinks of alcohol    Comment: rarely   Drug use: No   Sexual activity: Not Currently  Other Topics Concern   Not on file  Social History Narrative   Not on file   Social Determinants of Health   Financial Resource Strain: Not on file  Food Insecurity: No Food Insecurity (12/27/2022)   Hunger Vital Sign    Worried About Running Out of Food in the Last Year: Never true    Ran Out of Food in the Last Year: Never true  Transportation Needs: No Transportation Needs (12/27/2022)   PRAPARE - Administrator, Civil Service (Medical): No    Lack of Transportation (Non-Medical): No  Physical Activity: Not on file  Stress: Not on file  Social Connections: Not on file  Intimate Partner Violence: Not At Risk (12/27/2022)   Humiliation, Afraid, Rape, and Kick questionnaire    Fear of Current or Ex-Partner: No    Emotionally Abused: No    Physically Abused: No    Sexually Abused: No   Family History  Problem Relation Age of Onset   Breast cancer Sister    Breast cancer Sister    There were no vitals taken for this visit.  Wt Readings from Last 3 Encounters:  12/29/22 50.3 kg (110 lb 14.3 oz)  11/10/22 49.1 kg (108 lb 3.2 oz)  10/30/22 47.9 kg (105 lb 9.6 oz)   PHYSICAL EXAM: General:  NAD. No resp difficulty, arrived in [facility], frail HEENT: Blind Neck: Supple. No JVD, + cv waves. Carotids 2+ bilat; no bruits. No lymphadenopathy or  thryomegaly appreciated. Cor: PMI nondisplaced. Regular rate & rhythm. No rubs, gallops, 2/6 TR Lungs: Clear Abdomen: Soft, nontender, nondistended. No hepatosplenomegaly. No bruits or masses. Good bowel sounds. Extremities: No cyanosis, clubbing, rash, edema; +hematoma to R forearm Neuro: Alert & oriented x 3, cranial nerves grossly intact. Moves all 4 extremities w/o difficulty. Affect pleasant. . ECG (personally reviewed): NSR, 66 bpm  REDs: 22%  ASSESSMENT & PLAN:  1. Chronic Systolic Heart Failure - NICM - Echo (6/22): EF 20% RV moderately down RA massively dilated. Tachymediated from rapid Afib.  - LHC w/ nonobstructive CAD - Echo (12/22): EF normalized w/ restoration and maintenance of NSR, 55-60%, RV normal - Echo (5/23): EF back down, 45-50% (recurrent Afib w/ CVR) - Limited echo (8/23): EF 40-45%, RV mildly reduced, L/R atria severely dilated. Mild MR, Mod TR - Echo 2/24 EF 50-55% RV ok severe TR - RHC 09/05/22 RA 4 PA 43/10 (21) PCWP 11 PVR 6.5 Fick 2.6/1.9 - Ltd echo (6/24): EF 55-60%, G2DD, RV ok, severe TR. - NYHA III, limited by physical deconditioning and COPD. Volume OK today. ReDs 22% - Continue Lasix 20 mg PRN. Discussed it was OK to take MWF w/ extra KCL - Off Entresto, spiro and Jardiance with recurrent AKI and hyperkalemia; failed x 2.  Will not rechallenge unless EF drops back down.  - Stable today.  - Labs today.  2. PAH with severe TR - Echo 2/24 EF 50-55% RV ok severe TR - RHC 09/05/22 RA 4 PA 43/10 (21) PCWP 11 PVR 6.5 Fick 2.6/1.9 - Suspect primarily WHO Group 3 PH in setting of advanced COPD. Not candidate for selective pulmonary vasodilators -  Dr. Gala Romney to review with Structural team regarding candidacy for TV clip with functional TR. (She is reluctant to go to [place])  3. PAF  - she does not tolerate Afib well (prior h/o tachy mediated CM and cardiogenic shock). Long term suppression w/ amiodarone not ideal w/ COPD.  - s/p AF RFA 11/23 -  followed in AF Clinic.  - Maintained on amio 200 mg daily. - Remains in NSR on ECG today. - Continue Eliquis 2.5 mg bid. CBC today. - LFTs ok, TSH high and Free T4 low but she was off her levothyroxine, now back on.  4. Recurrent R pleural effusion - s/p tap 1/24 and 5/24 (transudative) - Suspect hydro-pneumothorax in setting of probable cardiac cirrhosis (hepatic u/s 2022 suggestive of cirrhosis. CT 2024 no mention of cirrhosis) - May need Pleurex, but she is reluctant about this   5. H/o AKI on CKD Stage IIIb and Hyperkalemia - Had AKI in 10/23 Entresto and Jardiance stopped - Labs today  6. Right forearm hematoma - 2/2 trauma from falling against car - Neurovascularly intact, pulses 2+ on exam today. - Check CBC, follow up with PCP  Follow up in 3 months with Dr. Tasia Catchings, as scheduled.   Jacklynn Ganong, FNP  11:50 AM

## 2022-12-29 NOTE — Plan of Care (Signed)
  Problem: Education: Goal: Understanding of disease, treatment, and recovery process will improve 12/29/2022 0722 by Cristal Ford, RN Outcome: Progressing 12/29/2022 0720 by Cristal Ford, RN Outcome: Progressing   Problem: Activity: Goal: Ability to return to baseline activity level will improve 12/29/2022 0722 by Cristal Ford, RN Outcome: Progressing 12/29/2022 0720 by Cristal Ford, RN Outcome: Progressing   Problem: Cardiac: Goal: Ability to maintain adequate cardiovascular perfusion will improve 12/29/2022 0722 by Cristal Ford, RN Outcome: Progressing 12/29/2022 0720 by Cristal Ford, RN Outcome: Progressing   Problem: Activity: Goal: Risk for activity intolerance will decrease Outcome: Progressing

## 2022-12-29 NOTE — Discharge Summary (Signed)
Physician Discharge Summary  Laurie James [place] ZOX:[id number] DOB: November 19, 1945 DOA: 12/27/2022  PCP: Georgann Housekeeper, MD  Admit date: 12/27/2022 Discharge date: 12/29/2022  Admitted From: Home Disposition:  Home  Discharge Condition:Stable CODE STATUS:FULL Diet recommendation: Heart Healthy   Brief/Interim Summary: Patient is a 77 year old female with blindness, diastolic CHF, severe TR, paroxysmal A-fib, coronary artery disease, COPD, CKD stage III, hypertension who presented with cough, shortness of breath for 2 days.  Reported cough for last 3 weeks.  Has 50-pack-year history of smoking before she quit 2 years ago.  On presentation she was mildly tachypneic.  COVID screen test was negative.  Elevated BNP.  Lab work showed mild leukocytosis of 12.7, hemoglobin of 8.7.  Chest x-ray showed moderate right and small left pleural effusion and possible right lower lobe opacity.  Patient was started on antibiotics.  Underwent right-sided thoracentesis on 9/18.  Currently she has been weaned to room air.  Feels very comfortable, denies any worsening shortness of the cough.  Very eager to go home today.  Physical therapy saw the patient and did not recommend follow-up.  Medically stable for discharge home today with oral antibiotics.  Following problems were addressed during the hospitalization:  COPD exacerbation/possible community-acquired pneumonia: Presented with cough, shortness of breath.  Started on azithromycin, ceftriaxone.  COVID screen test negative.  Also on steroid for COPD exacerbation.  This morning she feels significantly better.  On room air.  No wheezing today.  Antibiotics changed to oral, she will be discharged on 3 more days of prednisone.   Bilateral pleural effusion: Has history of this.  X-ray showed moderate right and small left pleural effusion.  Underwent right-sided thoracentesis by radiology with removal of 1.3 pleural fluid.  Protein is 4, but this is mostly likely  transudative secondary to severe TR. pending cytology.  Remains on room air this morning   Chronic diastolic CHF/severe TR: Appears euvolemic.  BNP is elevated.  Continue home diuretics.  Follow-up with cardiology as an outpatient   Paroxysmal A-fib: Currently in normal sinus rhythm.  Currently on Eliquis and amiodarone.  Follow-up with cardiology as an outpatient   CKD stage IIIa: Currently renal function baseline   Blindness/generalized weakness: PT/OT requested, no follow-up recommended   GERD: Continue PPI   Hyperlipidemia: Continue statin   Discharge Diagnoses:  Principal Problem:   COPD with acute exacerbation (HCC) Active Problems:   Bilateral pleural effusion   Chronic diastolic CHF (congestive heart failure) (HCC)   PAF (paroxysmal atrial fibrillation) (HCC)   Chronic kidney disease, stage 3a (HCC)   CAD (coronary artery disease)   Tricuspid regurgitation   Community acquired pneumonia    Discharge Instructions  Discharge Instructions     Diet general   Complete by: As directed    Discharge instructions   Complete by: As directed    1)Please take prescribed medications as instructed 2)Follow up with your PCP in a week.   Increase activity slowly   Complete by: As directed       Allergies as of 12/29/2022       Reactions   Codeine Nausea And Vomiting, Other (See Comments)   Upsets the stomach        Medication List     TAKE these medications    albuterol 108 (90 Base) MCG/ACT inhaler Commonly known as: VENTOLIN HFA Inhale 2 puffs into the lungs every 4 (four) hours as needed for wheezing.   amiodarone 200 MG tablet Commonly known as: PACERONE Take 1 tablet (  200 mg total) by mouth daily.   apixaban 2.5 MG Tabs tablet Commonly known as: ELIQUIS Take 1 tablet (2.5 mg total) by mouth 2 (two) times daily.   atorvastatin 40 MG tablet Commonly known as: LIPITOR Take 1 tablet by mouth once daily   azithromycin 500 MG tablet Commonly known as:  ZITHROMAX Take 1 tablet (500 mg total) by mouth daily for 3 days. Start taking on: December 30, 2022   budesonide-formoterol 80-4.5 MCG/ACT inhaler Commonly known as: Symbicort Inhale 2 puffs into the lungs in the morning and at bedtime.   cefdinir 300 MG capsule Commonly known as: OMNICEF Take 1 capsule (300 mg total) by mouth 2 (two) times daily for 2 days. Start taking on: December 30, 2022   cyanocobalamin 1000 MCG/ML injection Commonly known as: VITAMIN B12 Inject 1,000 mcg into the muscle every 30 (thirty) days.   furosemide 20 MG tablet Commonly known as: LASIX Take 1 tablet (20 mg total) by mouth as needed. Leg edema What changed:  reasons to take this additional instructions   guaiFENesin 600 MG 12 hr tablet Commonly known as: Mucinex Take 1 tablet (600 mg total) by mouth 2 (two) times daily for 7 days.   nitroGLYCERIN 0.4 MG SL tablet Commonly known as: NITROSTAT Place 1 tablet (0.4 mg total) under the tongue every 5 (five) minutes x 3 doses as needed for chest pain.   ondansetron 4 MG tablet Commonly known as: ZOFRAN Take 4 mg by mouth 3 (three) times daily as needed for nausea or vomiting.   pantoprazole 40 MG tablet Commonly known as: PROTONIX Take 1 tablet (40 mg total) by mouth 2 (two) times daily.   predniSONE 20 MG tablet Commonly known as: DELTASONE Take 2 tablets (40 mg total) by mouth daily with breakfast for 3 days. Start taking on: December 30, 2022   Synthroid 75 MCG tablet Generic drug: levothyroxine Take 75 mcg by mouth daily before breakfast.        Follow-up Information     Georgann Housekeeper, MD. Schedule an appointment as soon as possible for a visit in 1 week(s).   Specialty: Internal Medicine Contact information: [address] [place] [address] [phone]                Allergies  Allergen Reactions   Codeine Nausea And Vomiting and Other (See Comments)    Upsets the stomach     Consultations: None   Procedures/Studies: IR THORACENTESIS ASP PLEURAL SPACE W/IMG GUIDE  Result Date: 12/27/2022 INDICATION: 77 year old female with recurrent right-sided pleural effusion due to heart failure presents with shortness of breath. Previous chest x-ray showed enlarging right pleural effusion. Request for therapeutic and diagnostic thoracentesis. EXAM: ULTRASOUND GUIDED RIGHT THORACENTESIS MEDICATIONS: 5 mL 1% lidocaine COMPLICATIONS: None immediate. PROCEDURE: An ultrasound guided thoracentesis was thoroughly discussed with the patient and questions answered. The benefits, risks, alternatives and complications were also discussed. The patient understands and wishes to proceed with the procedure. Written consent was obtained. Ultrasound was performed to localize and mark an adequate pocket of fluid in the right chest. The area was then prepped and draped in the normal sterile fashion. 1% Lidocaine was used for local anesthesia. Under ultrasound guidance a 6 Fr Safe-T-Centesis catheter was introduced. Thoracentesis was performed. The catheter was removed and a dressing applied. FINDINGS: A total of approximately 1.3 L of hazy yellow fluid was removed. Samples were sent to the laboratory as requested by the clinical team. Post procedure chest X-ray  reviewed, negative for pneumothorax. IMPRESSION: Successful ultrasound guided right thoracentesis yielding 1.3 L of pleural fluid. Performed by: Lawernce Ion, PA-C Electronically Signed   By: Gilmer Mor D.O.   On: 12/27/2022 15:11   DG Chest 1 View  Result Date: 12/27/2022 CLINICAL DATA:  Status post right thoracentesis EXAM: CHEST  1 VIEW COMPARISON:  Chest radiograph dated 12/27/2022 at 9:04 a.m. FINDINGS: Normal lung volumes. No focal consolidations. Decreased trace right pleural effusion. Similar trace left pleural effusion. No pneumothorax. Similar cardiomediastinal silhouette. No acute osseous abnormality. IMPRESSION: Decreased trace right  pleural effusion status post thoracentesis. No pneumothorax. Electronically Signed   By: Agustin Cree M.D.   On: 12/27/2022 14:30   DG Chest 2 View  Result Date: 12/27/2022 CLINICAL DATA:  productive cough - 2 view requested EXAM: CHEST - 2 VIEW COMPARISON:  Chest radiograph 10/28/2022 FINDINGS: Moderate right and small left pleural effusion, increased from prior exam. There is a hazy opacity at the right lung base could represent atelectasis or infection. There is a right paratracheal opacity that likely represents a small amount of pleural fluid unchanged cardiac and mediastinal contours. No radiographically apparent displaced rib fractures. Visualized upper abdomen is unremarkable. Aortic atherosclerotic calcifications IMPRESSION: 1. Moderate right and small left pleural effusion, increased from prior exam. 2. Hazy opacity at the right lung base could represent atelectasis or infection. Electronically Signed   By: Lorenza Cambridge M.D.   On: 12/27/2022 09:43      Subjective: Patient seen and examined at bedside today.  Hemodynamically stable for discharge.  On room air.  Any shortness of breath or cough.I called and discussed with daughter Tresa Endo on phone about discharge planning  Discharge Exam: Vitals:   12/29/22 0735 12/29/22 0745  BP: 127/63   Pulse: 64   Resp: 16   Temp: 98 F (36.7 C)   SpO2: 100% 99%   Vitals:   12/29/22 0500 12/29/22 0615 12/29/22 0735 12/29/22 0745  BP:  123/79 127/63   Pulse:  63 64   Resp:  18 16   Temp:  98.7 F (37.1 C) 98 F (36.7 C)   TempSrc:   Oral   SpO2:  100% 100% 99%  Weight: 50.3 kg       General: Pt is alert, awake, not in acute distress Cardiovascular: RRR, S1/S2 +, no rubs, no gallops Respiratory: CTA bilaterally, no wheezing, no rhonchi Abdominal: Soft, NT, ND, bowel sounds + Extremities: no edema, no cyanosis    The results of significant diagnostics from this hospitalization (including imaging, microbiology, ancillary and laboratory)  are listed below for reference.     Microbiology: Recent Results (from the past 240 hour(s))  SARS Coronavirus 2 by RT PCR (hospital order, performed in [facility] hospital lab) *cepheid single result test* Anterior Nasal Swab     Status: None   Collection Time: 12/27/22  8:34 AM   Specimen: Anterior Nasal Swab  Result Value Ref Range Status   SARS Coronavirus 2 by RT PCR NEGATIVE NEGATIVE Final    Comment: Performed at [facility], [address], [place], [place] [address]     Labs: BNP (last 3 results) Recent Labs    10/27/22 1845 11/10/22 1503 12/27/22 1018  BNP 1,045.0* 854.2* 1,147.8*   Basic Metabolic Panel: Recent Labs  Lab 12/27/22 1019  NA 138  K 3.5  CL 105  CO2 22  GLUCOSE 95  BUN 23  CREATININE 1.10*  CALCIUM 8.6*   Liver Function Tests: Recent Labs  Lab 12/27/22 1708  PROT 7.1   No results for input(s): "LIPASE", "AMYLASE" in the last 168 hours. No results for input(s): "AMMONIA" in the last 168 hours. CBC: Recent Labs  Lab 12/27/22 1019  WBC 12.7*  NEUTROABS 10.5*  HGB 8.6*  HCT 27.6*  MCV 87.3  PLT 313   Cardiac Enzymes: No results for input(s): "CKTOTAL", "CKMB", "CKMBINDEX", "TROPONINI" in the last 168 hours. BNP: Invalid input(s): "POCBNP" CBG: No results for input(s): "GLUCAP" in the last 168 hours. D-Dimer No results for input(s): "DDIMER" in the last 72 hours. Hgb A1c No results for input(s): "HGBA1C" in the last 72 hours. Lipid Profile No results for input(s): "CHOL", "HDL", "LDLCALC", "TRIG", "CHOLHDL", "LDLDIRECT" in the last 72 hours. Thyroid function studies No results for input(s): "TSH", "T4TOTAL", "T3FREE", "THYROIDAB" in the last 72 hours.  Invalid input(s): "FREET3" Anemia work up No results for input(s): "VITAMINB12", "FOLATE", "FERRITIN", "TIBC", "IRON", "RETICCTPCT" in the last 72 hours. Urinalysis    Component Value Date/Time   COLORURINE YELLOW 05/01/2022 1922   APPEARANCEUR HAZY (A) 05/01/2022  1922   LABSPEC 1.029 05/01/2022 1922   PHURINE 5.0 05/01/2022 1922   GLUCOSEU 50 (A) 05/01/2022 1922   HGBUR SMALL (A) 05/01/2022 1922   BILIRUBINUR NEGATIVE 05/01/2022 1922   KETONESUR NEGATIVE 05/01/2022 1922   PROTEINUR 100 (A) 05/01/2022 1922   NITRITE NEGATIVE 05/01/2022 1922   LEUKOCYTESUR NEGATIVE 05/01/2022 1922   Sepsis Labs Recent Labs  Lab 12/27/22 1019  WBC 12.7*   Microbiology Recent Results (from the past 240 hour(s))  SARS Coronavirus 2 by RT PCR (hospital order, performed in [facility] hospital lab) *cepheid single result test* Anterior Nasal Swab     Status: None   Collection Time: 12/27/22  8:34 AM   Specimen: Anterior Nasal Swab  Result Value Ref Range Status   SARS Coronavirus 2 by RT PCR NEGATIVE NEGATIVE Final    Comment: Performed at [facility], [address], [place], [place] [address]    Please note: You were cared for by a hospitalist during your hospital stay. Once you are discharged, your primary care physician will handle any further medical issues. Please note that NO REFILLS for any discharge medications will be authorized once you are discharged, as it is imperative that you return to your primary care physician (or establish a relationship with a primary care physician if you do not have one) for your post hospital discharge needs so that they can reassess your need for medications and monitor your lab values.    Time coordinating discharge: 40 minutes  SIGNED:   Burnadette Pop, MD  [facility] 12/29/2022, 9:58 AM Pager [phone]  If 7PM-7AM, please contact night-coverage [email or web address] Password [email or web address]

## 2022-12-29 NOTE — Plan of Care (Signed)
  Problem: Education: Goal: Understanding of disease, treatment, and recovery process will improve Outcome: Progressing   Problem: Activity: Goal: Ability to return to baseline activity level will improve Outcome: Progressing   Problem: Cardiac: Goal: Ability to maintain adequate cardiovascular perfusion will improve Outcome: Progressing   Problem: Activity: Goal: Risk for activity intolerance will decrease Outcome: Progressing

## 2022-12-29 NOTE — Progress Notes (Signed)
Discharge instructions reviewed with pt and her daughter.  Copy of instructions given to pt/daughter.  Pt/daughter informed scripts sent to her pharmacy to pick up. Both verbalized understanding of all instructions.  Pt to be d/c'd via wheelchair with belongings, with daughter.           To be escorted by staff/hospital volunteer.   Annice Needy, RN SWOT

## 2023-01-02 ENCOUNTER — Encounter (HOSPITAL_COMMUNITY): Payer: Medicare PPO

## 2023-01-03 ENCOUNTER — Encounter (HOSPITAL_COMMUNITY): Payer: Self-pay

## 2023-01-03 ENCOUNTER — Ambulatory Visit (HOSPITAL_COMMUNITY)
Admission: RE | Admit: 2023-01-03 | Discharge: 2023-01-03 | Disposition: A | Discharge status: Home / Self Care | Payer: Medicare PPO | Source: Ambulatory Visit | Attending: Family Medicine

## 2023-01-03 VITALS — BP 114/66 | HR 69 | Wt 110.6 lb

## 2023-01-03 DIAGNOSIS — J449 Chronic obstructive pulmonary disease, unspecified: Secondary | ICD-10-CM | POA: Diagnosis not present

## 2023-01-03 DIAGNOSIS — I5022 Chronic systolic (congestive) heart failure: Secondary | ICD-10-CM | POA: Diagnosis not present

## 2023-01-03 DIAGNOSIS — I2721 Secondary pulmonary arterial hypertension: Secondary | ICD-10-CM

## 2023-01-03 DIAGNOSIS — Z9889 Other specified postprocedural states: Secondary | ICD-10-CM | POA: Insufficient documentation

## 2023-01-03 DIAGNOSIS — Z79899 Other long term (current) drug therapy: Secondary | ICD-10-CM | POA: Diagnosis not present

## 2023-01-03 DIAGNOSIS — Z7901 Long term (current) use of anticoagulants: Secondary | ICD-10-CM | POA: Diagnosis not present

## 2023-01-03 DIAGNOSIS — J90 Pleural effusion, not elsewhere classified: Secondary | ICD-10-CM

## 2023-01-03 DIAGNOSIS — I480 Paroxysmal atrial fibrillation: Secondary | ICD-10-CM | POA: Diagnosis not present

## 2023-01-03 DIAGNOSIS — I130 Hypertensive heart and chronic kidney disease with heart failure and stage 1 through stage 4 chronic kidney disease, or unspecified chronic kidney disease: Secondary | ICD-10-CM | POA: Insufficient documentation

## 2023-01-03 DIAGNOSIS — I428 Other cardiomyopathies: Secondary | ICD-10-CM | POA: Insufficient documentation

## 2023-01-03 DIAGNOSIS — I2510 Atherosclerotic heart disease of native coronary artery without angina pectoris: Secondary | ICD-10-CM | POA: Insufficient documentation

## 2023-01-03 DIAGNOSIS — E875 Hyperkalemia: Secondary | ICD-10-CM | POA: Diagnosis not present

## 2023-01-03 DIAGNOSIS — N1832 Chronic kidney disease, stage 3b: Secondary | ICD-10-CM | POA: Diagnosis not present

## 2023-01-03 DIAGNOSIS — N179 Acute kidney failure, unspecified: Secondary | ICD-10-CM | POA: Diagnosis not present

## 2023-01-03 LAB — COMPREHENSIVE METABOLIC PANEL
ALT: 34 U/L (ref 0–44)
AST: 29 U/L (ref 15–41)
Albumin: 2.7 g/dL — ABNORMAL LOW (ref 3.5–5.0)
Alkaline Phosphatase: 92 U/L (ref 38–126)
Anion gap: 7 (ref 5–15)
BUN: 31 mg/dL — ABNORMAL HIGH (ref 8–23)
CO2: 27 mmol/L (ref 22–32)
Calcium: 8.7 mg/dL — ABNORMAL LOW (ref 8.9–10.3)
Chloride: 99 mmol/L (ref 98–111)
Creatinine, Ser: 1.15 mg/dL — ABNORMAL HIGH (ref 0.44–1.00)
GFR, Estimated: 49 mL/min — ABNORMAL LOW (ref >60)
Glucose, Bld: 81 mg/dL (ref 70–99)
Potassium: 4.2 mmol/L (ref 3.5–5.1)
Sodium: 133 mmol/L — ABNORMAL LOW (ref 135–145)
Total Bilirubin: 1.2 mg/dL (ref 0.3–1.2)
Total Protein: 6.7 g/dL (ref 6.5–8.1)

## 2023-01-03 LAB — BRAIN NATRIURETIC PEPTIDE: B Natriuretic Peptide: 460.8 pg/mL — ABNORMAL HIGH (ref 0.0–100.0)

## 2023-01-03 LAB — TSH: TSH: 7.458 u[IU]/mL — ABNORMAL HIGH (ref 0.350–4.500)

## 2023-01-03 MED ORDER — AMIODARONE HCL 200 MG PO TABS: 200.0000 mg | ORAL_TABLET | Freq: Two times a day (BID) | ORAL | 6 refills | Status: DC

## 2023-01-03 MED ORDER — POTASSIUM CHLORIDE ER 10 MEQ PO TBCR: 10.0000 meq | EXTENDED_RELEASE_TABLET | ORAL | 6 refills | Status: DC

## 2023-01-03 MED ORDER — FUROSEMIDE 20 MG PO TABS: 20.0000 mg | ORAL_TABLET | ORAL | 6 refills | Status: DC

## 2023-01-03 NOTE — Patient Instructions (Addendum)
Thank you for coming in today  If you had labs drawn today, any labs that are abnormal the clinic will call you No news is good news  Medications: Increase Amiodarone to 200 mg 1 tablet twice Lasix 20 mg 1 tablet daily for 7 days then 01/12/2023 go to every Monday Wednesday and Friday Potassium 10 meq for 7 days then on 01/12/2023 go to every Monday Wednesday and Friday   I've reached out to the [facility] clinic for them to contact you for follow up appointment. If you have not heard anything please call their office for follow up.    Follow up appointments:  Your physician recommends that you schedule a follow-up appointment in:  4 months With Dr. Gala Romney  You will receive a reminder letter in the mail a few months in advance. If you don't receive a letter, please call our office to schedule the follow-up appointment.    Do the following things EVERYDAY: Weigh yourself in the morning before breakfast. Write it down and keep it in a log. Take your medicines as prescribed Eat low salt foods--Limit salt (sodium) to 2000 mg per day.  Stay as active as you can everyday Limit all fluids for the day to less than 2 liters   At the [facility], you and your health needs are our priority. As part of our continuing mission to provide you with exceptional heart care, we have created designated Provider Care Teams. These Care Teams include your primary Cardiologist (physician) and Advanced Practice Providers (APPs- Physician Assistants and Nurse Practitioners) who all work together to provide you with the care you need, when you need it.   You may see any of the following providers on your designated Care Team at your next follow up: Dr Arvilla Meres Dr Marca Ancona Dr. Marcos Eke, NP Robbie Lis, [place] [facility] [place], [place] Brynda Peon, NP Karle Plumber, PharmD   Please be sure to bring in all your medications bottles to every  appointment.    Thank you for choosing [facility]  If you have any questions or concerns before your next appointment please send [place] a message through [place] or call our office at [phone].    TO LEAVE A MESSAGE FOR THE NURSE SELECT OPTION 2, PLEASE LEAVE A MESSAGE INCLUDING: YOUR NAME DATE OF BIRTH CALL BACK NUMBER REASON FOR CALL**this is important as we prioritize the call backs  YOU WILL RECEIVE A CALL BACK THE SAME DAY AS LONG AS YOU CALL BEFORE 4:00 PM

## 2023-01-10 ENCOUNTER — Ambulatory Visit (HOSPITAL_COMMUNITY)
Admission: RE | Admit: 2023-01-10 | Discharge: 2023-01-10 | Disposition: A | Discharge status: Home / Self Care | Payer: Medicare PPO | Source: Ambulatory Visit | Attending: Internal Medicine | Admitting: Internal Medicine

## 2023-01-10 ENCOUNTER — Ambulatory Visit (HOSPITAL_COMMUNITY): Payer: Medicare PPO | Admitting: Internal Medicine

## 2023-01-10 VITALS — BP 102/68 | HR 79 | Ht <= 58 in | Wt 105.0 lb

## 2023-01-10 DIAGNOSIS — I480 Paroxysmal atrial fibrillation: Secondary | ICD-10-CM | POA: Diagnosis not present

## 2023-01-10 DIAGNOSIS — Z7901 Long term (current) use of anticoagulants: Secondary | ICD-10-CM | POA: Insufficient documentation

## 2023-01-10 DIAGNOSIS — I4891 Unspecified atrial fibrillation: Secondary | ICD-10-CM | POA: Diagnosis not present

## 2023-01-10 DIAGNOSIS — Z79899 Other long term (current) drug therapy: Secondary | ICD-10-CM | POA: Insufficient documentation

## 2023-01-10 DIAGNOSIS — I110 Hypertensive heart disease with heart failure: Secondary | ICD-10-CM | POA: Insufficient documentation

## 2023-01-10 DIAGNOSIS — D6869 Other thrombophilia: Secondary | ICD-10-CM | POA: Insufficient documentation

## 2023-01-10 DIAGNOSIS — I5022 Chronic systolic (congestive) heart failure: Secondary | ICD-10-CM | POA: Diagnosis not present

## 2023-01-10 MED ORDER — AMIODARONE HCL 200 MG PO TABS: ORAL_TABLET | ORAL | 6 refills | Status: DC

## 2023-01-10 NOTE — Patient Instructions (Signed)
Please continue amiodarone 200 mg twice daily for total of 1 month from 9/25. Then transition to amiodarone 200 mg once daily.

## 2023-01-10 NOTE — Progress Notes (Signed)
Primary Care Physician: Georgann Housekeeper, MD Primary Cardiologist: Dr Eden Emms  Primary Electrophysiologist: Dr Nelly Laurence Referring Physician: Dr Nelly Laurence [facility]: Dr Excell Seltzer Laurie James is a 77 y.o. female with a history of COPD, systolic CHF, HTN, aortic atherosclerosis, atrial fibrillation who presents for follow up in the [facility]. Atrial fibrillation was first diagnosed in June 2022 and managed with amiodarone. Amiodarone was discontinued due to severe COPD. A-fib recurred in May 2023 but was well rate controlled at that time. She had recurrence off of amiodarone, admitted with A-fib and RVR with rates up to 150s, CHF. Amiodarone was restarted, but she was thought not to be a candidate to to potential pulmonary toxicity. Patient is on Eliquis for a CHADS2VASC score of 6. She was evaluated by Dr Nelly Laurence and underwent afib ablation 03/08/22. She was seen at the ED 03/09/22 with lethargy. By the time EMS got her to the hospital she was at her baseline. She was also seen at the ED 03/31/22 for two small episodes of hemoptysis. Chest CT was negative for any acute process.   On follow up today, patient reports that she is doing well today. Her hemoptysis has resolved. She denies any fever, malaise, or chest pain. No groin issues. She remains in SR.   On follow up 01/10/23, she is currently in what appears to be atrial flutter. Recent admission 9/18-20 for SOB; underwent right-sided thoracentesis on 9/18; discharged in normal sinus rhythm. Seen by HF clinic on 9/25 and noted to be in rate controlled Afib. Amiodarone increased to 200 mg BID. Cardioversion was discussed and patient declined at that time wishing to not do it. She is of the same sentiment today.   Today, she denies symptoms of palpitations, chest pain, shortness of breath, orthopnea, PND, lower extremity edema, dizziness, presyncope, syncope, snoring, daytime somnolence, bleeding, or neurologic  sequela. The patient is tolerating medications without difficulties and is otherwise without complaint today.    Atrial Fibrillation Risk Factors:  she does not have symptoms or diagnosis of sleep apnea. she does not have a history of rheumatic fever.   she has a BMI of Body mass index is 23.54 kg/m.Marland Kitchen Filed Weights   01/10/23 1447  Weight: 47.6 kg     Family History  Problem Relation Age of Onset   Breast cancer Sister    Breast cancer Sister      Atrial Fibrillation Management history:  Previous antiarrhythmic drugs: amiodarone  Previous cardioversions: none Previous ablations: 03/08/22 CHADS2VASC score: 6 Anticoagulation history: Eliquis   Past Medical History:  Diagnosis Date   AMD (age related macular degeneration)    COPD (chronic obstructive pulmonary disease) (HCC)    Depression    Hiatal hernia    Intermittent low back pain    Memory impairment    Midsternal chest pain    a. 12/2011 Cardiac CTA Ca++ score of 103.3 (80th %), LAD <50p/m, RCA 50-75.   Osteoporosis    PAF (paroxysmal atrial fibrillation) (HCC) 09/2020   Paroxysmal atrial fibrillation with RVR (HCC) 11/28/2021   Vitamin B 12 deficiency    Past Surgical History:  Procedure Laterality Date   ABDOMINAL HYSTERECTOMY     ATRIAL FIBRILLATION ABLATION N/A 03/08/2022   Procedure: ATRIAL FIBRILLATION ABLATION;  Surgeon: Maurice Small, MD;  [facility]: [facility];  Service: Cardiovascular;  Laterality: N/A;   IR THORACENTESIS ASP PLEURAL SPACE W/IMG GUIDE  10/20/2020   IR THORACENTESIS ASP PLEURAL SPACE W/IMG  GUIDE  05/02/2022   IR THORACENTESIS ASP PLEURAL SPACE W/IMG GUIDE  09/07/2022   IR THORACENTESIS ASP PLEURAL SPACE W/IMG GUIDE  12/27/2022   RIGHT HEART CATH N/A 09/06/2022   Procedure: RIGHT HEART CATH;  Surgeon: Dorthula Nettles, DO;  Location: [facility]VAS[facility] CV LAB;  Service: Cardiovascular;  Laterality: N/A;   RIGHT/LEFT HEART CATH AND CORONARY ANGIOGRAPHY N/A 10/07/2020   Procedure:  RIGHT/LEFT HEART CATH AND CORONARY ANGIOGRAPHY;  Surgeon: Runell Gess, MD;  Location: [facility]AS[facility] CV LAB;  Service: Cardiovascular;  Laterality: N/A;   TEE WITHOUT CARDIOVERSION N/A 10/25/2020   Procedure: TRANSESOPHAGEAL ECHOCARDIOGRAM (TEE);  Surgeon: Quintella Reichert, MD;  Location: [facility] ENDOSCOPY;  Service: Cardiovascular;  Laterality: N/A;    Current Outpatient Medications  Medication Sig Dispense Refill   albuterol (VENTOLIN HFA) 108 (90 Base) MCG/ACT inhaler Inhale 2 puffs into the lungs every 4 (four) hours as needed for wheezing.     apixaban (ELIQUIS) 2.5 MG TABS tablet Take 1 tablet (2.5 mg total) by mouth 2 (two) times daily. 60 tablet 11   atorvastatin (LIPITOR) 40 MG tablet Take 1 tablet by mouth once daily 90 tablet 3   budesonide-formoterol (SYMBICORT) 80-4.5 MCG/ACT inhaler Inhale 2 puffs into the lungs in the morning and at bedtime. 1 each 0   cyanocobalamin (,VITAMIN B-12,) 1000 MCG/ML injection Inject 1,000 mcg into the muscle every 30 (thirty) days.  3   [START ON 01/12/2023] furosemide (LASIX) 20 MG tablet Take 1 tablet (20 mg total) by mouth every Monday, Wednesday, and Friday. Leg edema 30 tablet 6   nitroGLYCERIN (NITROSTAT) 0.4 MG SL tablet Place 1 tablet (0.4 mg total) under the tongue every 5 (five) minutes x 3 doses as needed for chest pain. 25 tablet 0   ondansetron (ZOFRAN) 4 MG tablet Take 4 mg by mouth 3 (three) times daily as needed for nausea or vomiting.     pantoprazole (PROTONIX) 40 MG tablet Take 1 tablet (40 mg total) by mouth 2 (two) times daily. 180 tablet 3   [START ON 01/12/2023] potassium chloride (KLOR-CON) 10 MEQ tablet Take 1 tablet (10 mEq total) by mouth every Monday, Wednesday, and Friday. 20 tablet 6   SYNTHROID 75 MCG tablet Take 75 mcg by mouth daily before breakfast.     amiodarone (PACERONE) 200 MG tablet Take 1 tablet (200 mg total) by mouth 2 (two) times daily for 30 days, THEN 1 tablet (200 mg total) daily. 60 tablet 6   No current  facility-administered medications for this encounter.    Allergies  Allergen Reactions   Codeine Nausea And Vomiting and Other (See Comments)    Upsets the stomach   ROS- All systems are reviewed and negative except as per the HPI above.  Physical Exam: Vitals:   01/10/23 1447  BP: 102/68  Pulse: 79  Weight: 47.6 kg  Height: 4\' 8"  (1.422 m)    GEN- The patient is well appearing, alert and oriented x 3 today.   Neck - no JVD or carotid bruit noted Lungs- Clear to ausculation bilaterally, normal work of breathing Heart- Regular rate (appears to be atrial flutter), no murmurs, rubs or gallops, PMI not laterally displaced Extremities- no clubbing, cyanosis, or edema Skin - no rash or ecchymosis noted   Wt Readings from Last 3 Encounters:  01/10/23 47.6 kg  01/03/23 50.2 kg  12/29/22 50.3 kg    EKG today demonstrates  Vent. rate 79 BPM PR interval 254 ms QRS duration 100 ms QT/QTcB 384/440 ms  P-R-T axes * 51 246 Sinus rhythm with 1st degree A-V block ST & T wave abnormality, consider inferior ischemia ST & T wave abnormality, consider anterolateral ischemia Abnormal ECG When compared with ECG of 03-Jan-2023 11:42, PREVIOUS ECG IS PRESENT  Echo 11/29/21 demonstrated   1. Hypokinetic basal-mid anterior/anteroseptal wall. Left ventricular  ejection fraction, by estimation, is 40 to 45%. The left ventricle has  mildly decreased function. The left ventricle demonstrates global  hypokinesis. Left ventricular diastolic parameters are consistent with Grade II diastolic dysfunction (pseudonormalization).   2. Right ventricular systolic function is mildly reduced. The right  ventricular size is moderately enlarged. There is moderately elevated  pulmonary artery systolic pressure. The estimated right ventricular  systolic pressure is 54.0 mmHg.   3. Left atrial size was severely dilated.   4. Right atrial size was severely dilated.   5. The mitral valve is normal in structure.  Mild mitral valve  regurgitation.   6. Tricuspid valve regurgitation is moderate.   7. The aortic valve was not well visualized. Aortic valve regurgitation  is trivial.   8. The inferior vena cava is dilated in size with >50% respiratory  variability, suggesting right atrial pressure of 8 mmHg.   Comparison(s): No significant change from prior study.   Epic records are reviewed at length today  CHA2DS2-VASc Score = 6  The patient's score is based upon: CHF History: 1 HTN History: 1 Diabetes History: 0 Stroke History: 0 Vascular Disease History: 1 (aortic atherosclerosis) Age Score: 2 Gender Score: 1       ASSESSMENT AND PLAN: 1. Paroxysmal Atrial Fibrillation (ICD10:  I48.0) The patient's CHA2DS2-VASc score is 6, indicating a 9.7% annual risk of stroke.   S/p afib ablation 03/08/22  Patient appears to be in atrial flutter.  Continue amiodarone 200 mg BID for now. She declines procedure at this point so will have patient reload amiodarone 200 mg BID for total of 1 month then transition to 200 mg once daily. Since patient at this time declines intervention, will f/u Afib clinic prn.   2. Secondary Hypercoagulable State (ICD10:  D68.69) The patient is at significant risk for stroke/thromboembolism based upon her CHA2DS2-VASc Score of 6.  Continue Apixaban (Eliquis).  Continue Eliquis 2.5 mg BID.   3. Chronic HFrEF Suspected tachycardia mediated. Followed in the [facility], has visit later today with Dr Gala Romney. Fluid status appears stable today.  4. HTN Stable, no changes today.   Follow up Afib clinic prn.    Justin Mend, PA-C Afib Clinic [facility] [address] [place], [place] [address] [phone] 01/10/2023 3:28 PM

## 2023-01-11 ENCOUNTER — Ambulatory Visit (HOSPITAL_COMMUNITY): Payer: Medicare PPO | Admitting: Internal Medicine

## 2023-01-15 DIAGNOSIS — M79641 Pain in right hand: Secondary | ICD-10-CM | POA: Diagnosis not present

## 2023-01-15 DIAGNOSIS — S61411A Laceration without foreign body of right hand, initial encounter: Secondary | ICD-10-CM | POA: Diagnosis not present

## 2023-01-15 DIAGNOSIS — S60921A Unspecified superficial injury of right hand, initial encounter: Secondary | ICD-10-CM | POA: Diagnosis not present

## 2023-01-29 ENCOUNTER — Other Ambulatory Visit: Payer: Self-pay | Admitting: Internal Medicine

## 2023-01-29 DIAGNOSIS — I4819 Other persistent atrial fibrillation: Secondary | ICD-10-CM

## 2023-01-30 ENCOUNTER — Other Ambulatory Visit: Payer: Self-pay

## 2023-01-30 ENCOUNTER — Emergency Department (HOSPITAL_COMMUNITY): Payer: Medicare PPO

## 2023-01-30 ENCOUNTER — Observation Stay (HOSPITAL_COMMUNITY)
Admission: EM | Admit: 2023-01-30 | Discharge: 2023-01-31 | Disposition: A | Discharge status: Home Health | Payer: Medicare PPO | Attending: Family Medicine | Admitting: Family Medicine

## 2023-01-30 DIAGNOSIS — R6889 Other general symptoms and signs: Secondary | ICD-10-CM | POA: Diagnosis not present

## 2023-01-30 DIAGNOSIS — M25551 Pain in right hip: Secondary | ICD-10-CM | POA: Diagnosis not present

## 2023-01-30 DIAGNOSIS — I509 Heart failure, unspecified: Secondary | ICD-10-CM

## 2023-01-30 DIAGNOSIS — E785 Hyperlipidemia, unspecified: Secondary | ICD-10-CM | POA: Diagnosis present

## 2023-01-30 DIAGNOSIS — N179 Acute kidney failure, unspecified: Secondary | ICD-10-CM | POA: Diagnosis present

## 2023-01-30 DIAGNOSIS — I272 Pulmonary hypertension, unspecified: Secondary | ICD-10-CM | POA: Diagnosis present

## 2023-01-30 DIAGNOSIS — S72001A Fracture of unspecified part of neck of right femur, initial encounter for closed fracture: Secondary | ICD-10-CM | POA: Diagnosis not present

## 2023-01-30 DIAGNOSIS — J449 Chronic obstructive pulmonary disease, unspecified: Secondary | ICD-10-CM | POA: Diagnosis present

## 2023-01-30 DIAGNOSIS — R7989 Other specified abnormal findings of blood chemistry: Secondary | ICD-10-CM | POA: Diagnosis present

## 2023-01-30 DIAGNOSIS — R059 Cough, unspecified: Secondary | ICD-10-CM | POA: Diagnosis not present

## 2023-01-30 DIAGNOSIS — I517 Cardiomegaly: Secondary | ICD-10-CM | POA: Diagnosis not present

## 2023-01-30 DIAGNOSIS — E440 Moderate protein-calorie malnutrition: Secondary | ICD-10-CM | POA: Diagnosis present

## 2023-01-30 DIAGNOSIS — R7401 Elevation of levels of liver transaminase levels: Secondary | ICD-10-CM | POA: Insufficient documentation

## 2023-01-30 DIAGNOSIS — S72141A Displaced intertrochanteric fracture of right femur, initial encounter for closed fracture: Secondary | ICD-10-CM | POA: Diagnosis present

## 2023-01-30 DIAGNOSIS — R918 Other nonspecific abnormal finding of lung field: Secondary | ICD-10-CM | POA: Diagnosis not present

## 2023-01-30 DIAGNOSIS — I5033 Acute on chronic diastolic (congestive) heart failure: Secondary | ICD-10-CM | POA: Diagnosis not present

## 2023-01-30 DIAGNOSIS — M109 Gout, unspecified: Secondary | ICD-10-CM | POA: Diagnosis present

## 2023-01-30 DIAGNOSIS — N1831 Chronic kidney disease, stage 3a: Secondary | ICD-10-CM | POA: Diagnosis present

## 2023-01-30 DIAGNOSIS — R069 Unspecified abnormalities of breathing: Secondary | ICD-10-CM | POA: Diagnosis not present

## 2023-01-30 DIAGNOSIS — I5043 Acute on chronic combined systolic (congestive) and diastolic (congestive) heart failure: Secondary | ICD-10-CM | POA: Diagnosis not present

## 2023-01-30 DIAGNOSIS — Z87891 Personal history of nicotine dependence: Secondary | ICD-10-CM | POA: Diagnosis not present

## 2023-01-30 DIAGNOSIS — I5042 Chronic combined systolic (congestive) and diastolic (congestive) heart failure: Principal | ICD-10-CM | POA: Insufficient documentation

## 2023-01-30 DIAGNOSIS — Z043 Encounter for examination and observation following other accident: Secondary | ICD-10-CM | POA: Diagnosis not present

## 2023-01-30 DIAGNOSIS — Z7901 Long term (current) use of anticoagulants: Secondary | ICD-10-CM | POA: Diagnosis not present

## 2023-01-30 DIAGNOSIS — Z7401 Bed confinement status: Secondary | ICD-10-CM | POA: Diagnosis not present

## 2023-01-30 DIAGNOSIS — H3530 Unspecified macular degeneration: Secondary | ICD-10-CM | POA: Diagnosis present

## 2023-01-30 DIAGNOSIS — Z66 Do not resuscitate: Secondary | ICD-10-CM | POA: Diagnosis not present

## 2023-01-30 DIAGNOSIS — E875 Hyperkalemia: Secondary | ICD-10-CM | POA: Diagnosis present

## 2023-01-30 DIAGNOSIS — I480 Paroxysmal atrial fibrillation: Secondary | ICD-10-CM | POA: Diagnosis present

## 2023-01-30 DIAGNOSIS — R0602 Shortness of breath: Secondary | ICD-10-CM | POA: Diagnosis present

## 2023-01-30 DIAGNOSIS — Z1152 Encounter for screening for COVID-19: Secondary | ICD-10-CM | POA: Insufficient documentation

## 2023-01-30 DIAGNOSIS — R001 Bradycardia, unspecified: Secondary | ICD-10-CM | POA: Diagnosis present

## 2023-01-30 DIAGNOSIS — J90 Pleural effusion, not elsewhere classified: Secondary | ICD-10-CM | POA: Diagnosis not present

## 2023-01-30 DIAGNOSIS — S72001D Fracture of unspecified part of neck of right femur, subsequent encounter for closed fracture with routine healing: Secondary | ICD-10-CM | POA: Diagnosis not present

## 2023-01-30 DIAGNOSIS — I2510 Atherosclerotic heart disease of native coronary artery without angina pectoris: Secondary | ICD-10-CM | POA: Insufficient documentation

## 2023-01-30 DIAGNOSIS — R0989 Other specified symptoms and signs involving the circulatory and respiratory systems: Secondary | ICD-10-CM | POA: Diagnosis not present

## 2023-01-30 DIAGNOSIS — S72009A Fracture of unspecified part of neck of unspecified femur, initial encounter for closed fracture: Secondary | ICD-10-CM | POA: Diagnosis present

## 2023-01-30 DIAGNOSIS — G4733 Obstructive sleep apnea (adult) (pediatric): Secondary | ICD-10-CM | POA: Diagnosis present

## 2023-01-30 DIAGNOSIS — I130 Hypertensive heart and chronic kidney disease with heart failure and stage 1 through stage 4 chronic kidney disease, or unspecified chronic kidney disease: Secondary | ICD-10-CM | POA: Insufficient documentation

## 2023-01-30 DIAGNOSIS — Y92009 Unspecified place in unspecified non-institutional (private) residence as the place of occurrence of the external cause: Secondary | ICD-10-CM | POA: Diagnosis not present

## 2023-01-30 DIAGNOSIS — Z79899 Other long term (current) drug therapy: Secondary | ICD-10-CM | POA: Diagnosis not present

## 2023-01-30 DIAGNOSIS — E039 Hypothyroidism, unspecified: Secondary | ICD-10-CM | POA: Diagnosis present

## 2023-01-30 DIAGNOSIS — D62 Acute posthemorrhagic anemia: Secondary | ICD-10-CM | POA: Diagnosis not present

## 2023-01-30 DIAGNOSIS — E1122 Type 2 diabetes mellitus with diabetic chronic kidney disease: Secondary | ICD-10-CM | POA: Insufficient documentation

## 2023-01-30 DIAGNOSIS — I5032 Chronic diastolic (congestive) heart failure: Secondary | ICD-10-CM | POA: Diagnosis present

## 2023-01-30 DIAGNOSIS — K219 Gastro-esophageal reflux disease without esophagitis: Secondary | ICD-10-CM | POA: Diagnosis present

## 2023-01-30 DIAGNOSIS — I071 Rheumatic tricuspid insufficiency: Secondary | ICD-10-CM | POA: Diagnosis present

## 2023-01-30 DIAGNOSIS — W010XXA Fall on same level from slipping, tripping and stumbling without subsequent striking against object, initial encounter: Secondary | ICD-10-CM | POA: Diagnosis present

## 2023-01-30 DIAGNOSIS — W19XXXA Unspecified fall, initial encounter: Secondary | ICD-10-CM | POA: Diagnosis not present

## 2023-01-30 DIAGNOSIS — I2489 Other forms of acute ischemic heart disease: Secondary | ICD-10-CM | POA: Diagnosis present

## 2023-01-30 DIAGNOSIS — I959 Hypotension, unspecified: Secondary | ICD-10-CM | POA: Diagnosis not present

## 2023-01-30 DIAGNOSIS — M25572 Pain in left ankle and joints of left foot: Secondary | ICD-10-CM | POA: Diagnosis not present

## 2023-01-30 DIAGNOSIS — M810 Age-related osteoporosis without current pathological fracture: Secondary | ICD-10-CM | POA: Diagnosis present

## 2023-01-30 LAB — BRAIN NATRIURETIC PEPTIDE: B Natriuretic Peptide: 814.7 pg/mL — ABNORMAL HIGH (ref 0.0–100.0)

## 2023-01-30 LAB — CBC
HCT: 28.4 % — ABNORMAL LOW (ref 36.0–46.0)
Hemoglobin: 8.4 g/dL — ABNORMAL LOW (ref 12.0–15.0)
MCH: 26.1 pg (ref 26.0–34.0)
MCHC: 29.6 g/dL — ABNORMAL LOW (ref 30.0–36.0)
MCV: 88.2 fL (ref 80.0–100.0)
Platelets: 372 10*3/uL (ref 150–400)
RBC: 3.22 MIL/uL — ABNORMAL LOW (ref 3.87–5.11)
RDW: 20.1 % — ABNORMAL HIGH (ref 11.5–15.5)
WBC: 8.7 10*3/uL (ref 4.0–10.5)
nRBC: 0 % (ref 0.0–0.2)

## 2023-01-30 LAB — COMPREHENSIVE METABOLIC PANEL
ALT: 84 U/L — ABNORMAL HIGH (ref 0–44)
AST: 95 U/L — ABNORMAL HIGH (ref 15–41)
Albumin: 2.8 g/dL — ABNORMAL LOW (ref 3.5–5.0)
Alkaline Phosphatase: 79 U/L (ref 38–126)
Anion gap: 7 (ref 5–15)
BUN: 24 mg/dL — ABNORMAL HIGH (ref 8–23)
CO2: 20 mmol/L — ABNORMAL LOW (ref 22–32)
Calcium: 8.4 mg/dL — ABNORMAL LOW (ref 8.9–10.3)
Chloride: 108 mmol/L (ref 98–111)
Creatinine, Ser: 1.15 mg/dL — ABNORMAL HIGH (ref 0.44–1.00)
GFR, Estimated: 49 mL/min — ABNORMAL LOW (ref >60)
Glucose, Bld: 98 mg/dL (ref 70–99)
Potassium: 4.2 mmol/L (ref 3.5–5.1)
Sodium: 135 mmol/L (ref 135–145)
Total Bilirubin: 1.1 mg/dL (ref 0.3–1.2)
Total Protein: 7.3 g/dL (ref 6.5–8.1)

## 2023-01-30 LAB — RESP PANEL BY RT-PCR (RSV, FLU A&B, COVID)  RVPGX2
Influenza A by PCR: NEGATIVE
Influenza B by PCR: NEGATIVE
Resp Syncytial Virus by PCR: NEGATIVE
SARS Coronavirus 2 by RT PCR: NEGATIVE

## 2023-01-30 MED ORDER — AMIODARONE HCL 200 MG PO TABS
200.0000 mg | ORAL_TABLET | Freq: Two times a day (BID) | ORAL | Status: DC
  Administered 2023-01-30 – 2023-01-31 (×2): 200 mg via ORAL
  Filled 2023-01-30 (×2): qty 1

## 2023-01-30 MED ORDER — FUROSEMIDE 10 MG/ML IJ SOLN
40.0000 mg | Freq: Every day | INTRAMUSCULAR | Status: DC
  Administered 2023-01-31: 40 mg via INTRAVENOUS
  Filled 2023-01-30: qty 4

## 2023-01-30 MED ORDER — ATORVASTATIN CALCIUM 40 MG PO TABS
40.0000 mg | ORAL_TABLET | Freq: Every day | ORAL | Status: DC
  Administered 2023-01-31: 40 mg via ORAL
  Filled 2023-01-30: qty 1

## 2023-01-30 MED ORDER — PANTOPRAZOLE SODIUM 40 MG PO TBEC
40.0000 mg | DELAYED_RELEASE_TABLET | Freq: Two times a day (BID) | ORAL | Status: DC
  Administered 2023-01-30 – 2023-01-31 (×2): 40 mg via ORAL
  Filled 2023-01-30 (×2): qty 1

## 2023-01-30 MED ORDER — AMIODARONE HCL 200 MG PO TABS: 200.0000 mg | ORAL_TABLET | Freq: Every day | ORAL | Status: DC

## 2023-01-30 MED ORDER — MOMETASONE FURO-FORMOTEROL FUM 100-5 MCG/ACT IN AERO
2.0000 | INHALATION_SPRAY | Freq: Two times a day (BID) | RESPIRATORY_TRACT | Status: DC
  Administered 2023-01-30 – 2023-01-31 (×2): 2 via RESPIRATORY_TRACT
  Filled 2023-01-30 (×2): qty 8.8

## 2023-01-30 MED ORDER — POTASSIUM CHLORIDE CRYS ER 10 MEQ PO TBCR
10.0000 meq | EXTENDED_RELEASE_TABLET | ORAL | Status: DC
  Administered 2023-01-31: 10 meq via ORAL
  Filled 2023-01-30: qty 1

## 2023-01-30 MED ORDER — FUROSEMIDE 10 MG/ML IJ SOLN
60.0000 mg | Freq: Once | INTRAMUSCULAR | Status: AC
  Administered 2023-01-30: 60 mg via INTRAVENOUS
  Filled 2023-01-30: qty 6

## 2023-01-30 MED ORDER — LEVOTHYROXINE SODIUM 75 MCG PO TABS
75.0000 ug | ORAL_TABLET | Freq: Every day | ORAL | Status: DC
  Administered 2023-01-31: 75 ug via ORAL
  Filled 2023-01-30: qty 1

## 2023-01-30 MED ORDER — APIXABAN 2.5 MG PO TABS
2.5000 mg | ORAL_TABLET | Freq: Two times a day (BID) | ORAL | Status: DC
  Administered 2023-01-30 – 2023-01-31 (×2): 2.5 mg via ORAL
  Filled 2023-01-30 (×2): qty 1

## 2023-01-30 NOTE — ED Triage Notes (Signed)
Ems called out for SOB. Pt has hx of COPD , CHF does not wear o2 at home. With EMS o2 dropped to low 80s. Pt refused to wear [place].Denies CP. Bp 153/64, P 61, 98% on room air without exertion.

## 2023-01-30 NOTE — Assessment & Plan Note (Signed)
S/p ablation 2023 -continue amiodarone BID -continue Eliquis -follow for AF clinic

## 2023-01-30 NOTE — Assessment & Plan Note (Signed)
-  NYHA IIb-III -last echocardiogram 10/2022 with LVEF of 55-60% with grade II diastolic dysfunction, severe TV regurgitation.  -Off Entresto, spiro and Jardiance with recurrent AKI and hyperkalemia -recently started on Lasix 20mg  MWF last month -continue IV 40mg  lasix daily  -strict intake and output, daily weights

## 2023-01-30 NOTE — H&P (Signed)
History and Physical    Patient: Laurie James WUJ:[id number] DOB: 06/16/1945 DOA: 01/30/2023 DOS: the patient was seen and examined on 01/30/2023 PCP: Georgann Housekeeper, MD  Patient coming from: Home  Chief Complaint:  Chief Complaint  Patient presents with   Shortness of Breath   HPI: [name] is a 77 y.o. female with medical history significant of blindness, diastolic CHF, severe TR, paroxysmal A-fib, coronary artery disease, COPD, CKD stage III, hypertension who presents with shortness of breath.  Pt reports increasing shortness of breath since last night with orthopnea and also increase LE edema. Has been on Lasix MWF. Denies any increase fluid or sodium intake. No chest pain or palpitation. Had an episode of vomiting yesterday. No diarrhea.   Patient recently hospitalized from 9/18-9/20 with COPD exacerbation, possible community acquired pneumonia, and had bilateral pleural effusion requiring right sided thoracentesis yielding 1.3L of fluid on 9/18. Last right sided thoracentesis prior to that was in July.   On arrival to ED, she was afebrile, BP of 145/71 and stable at rest on room air. However she has oxygen desaturation to high 80s on ambulation.   CBC without leukocytosis, hgb stable around baseline at 8.4.   CMP notable for CO2 of 20 but normal anion gap. AST and ALT elevated at 95 and 84 respectively.   BNP elevated to 814. EKG on my review in sinus bradycardia with prolonged QT and non-specific T wave inverions to inferolateral leads which is present in prior EKG.   CXR with bilateral small pleural effusion worse on the right.   Review of Systems: As mentioned in the history of present illness. All other systems reviewed and are negative. Past Medical History:  Diagnosis Date   AMD (age related macular degeneration)    COPD (chronic obstructive pulmonary disease) (HCC)    Depression    Hiatal hernia    Intermittent low back pain     Memory impairment    Midsternal chest pain    a. 12/2011 Cardiac CTA Ca++ score of 103.3 (80th %), LAD <50p/m, RCA 50-75.   Osteoporosis    PAF (paroxysmal atrial fibrillation) (HCC) 09/2020   Paroxysmal atrial fibrillation with RVR (HCC) 11/28/2021   Vitamin B 12 deficiency    Past Surgical History:  Procedure Laterality Date   ABDOMINAL HYSTERECTOMY     ATRIAL FIBRILLATION ABLATION N/A 03/08/2022   Procedure: ATRIAL FIBRILLATION ABLATION;  Surgeon: Maurice Small, MD;  [facility] LAB;  Service: Cardiovascular;  Laterality: N/A;   IR THORACENTESIS ASP PLEURAL SPACE W/IMG GUIDE  10/20/2020   IR THORACENTESIS ASP PLEURAL SPACE W/IMG GUIDE  05/02/2022   IR THORACENTESIS ASP PLEURAL SPACE W/IMG GUIDE  09/07/2022   IR THORACENTESIS ASP PLEURAL SPACE W/IMG GUIDE  12/27/2022   RIGHT HEART CATH N/A 09/06/2022   Procedure: RIGHT HEART CATH;  Surgeon: Dorthula Nettles, DO;  Location: [facility] LAB;  Service: Cardiovascular;  Laterality: N/A;   RIGHT/LEFT HEART CATH AND CORONARY ANGIOGRAPHY N/A 10/07/2020   Procedure: RIGHT/LEFT HEART CATH AND CORONARY ANGIOGRAPHY;  Surgeon: Runell Gess, MD;  Location: [facility];  Service: Cardiovascular;  Laterality: N/A;   TEE WITHOUT CARDIOVERSION N/A 10/25/2020   Procedure: TRANSESOPHAGEAL ECHOCARDIOGRAM (TEE);  Surgeon: Quintella Reichert, MD;  Location: [facility] ENDOSCOPY;  Service: Cardiovascular;  Laterality: N/A;   Social History:  reports that she quit smoking about 2 years ago. Her smoking use included cigarettes. She started smoking about 67 years ago. She has  a 65 pack-year smoking history. She has never used smokeless tobacco. She reports that she does not drink alcohol and does not use drugs.  Allergies  Allergen Reactions   Codeine Nausea And Vomiting and Other (See Comments)    Upsets the stomach    Family History  Problem Relation Age of Onset   Breast cancer Sister    Breast cancer Sister     Prior to Admission  medications   Medication Sig Start Date End Date Taking? Authorizing Provider  albuterol (VENTOLIN HFA) 108 (90 Base) MCG/ACT inhaler Inhale 2 puffs into the lungs every 4 (four) hours as needed for wheezing. 06/08/20  Yes [provider]  amiodarone (PACERONE) 200 MG tablet Take 1 tablet (200 mg total) by mouth 2 (two) times daily for 30 days, THEN 1 tablet (200 mg total) daily. 01/10/23 02/09/24 Yes Eustace Pen, PA-C  apixaban Everlene Balls) 2.5 MG TABS tablet Take 1 tablet by mouth twice daily 01/30/23  Yes Bensimhon, Bevelyn Buckles, MD  atorvastatin (LIPITOR) 40 MG tablet Take 1 tablet by mouth once daily 09/21/22  Yes Wendall Stade, MD  budesonide-formoterol (SYMBICORT) 80-4.5 MCG/ACT inhaler Inhale 2 puffs into the lungs in the morning and at bedtime. 10/30/22  Yes Briant Cedar, MD  cyanocobalamin (,VITAMIN B-12,) 1000 MCG/ML injection Inject 1,000 mcg into the muscle every 30 (thirty) days. 06/11/16  Yes [provider]  furosemide (LASIX) 20 MG tablet Take 1 tablet (20 mg total) by mouth every Monday, Wednesday, and Friday. Leg edema 01/12/23  Yes [place], FNP  mupirocin ointment (BACTROBAN) 2 % Apply 1 Application topically daily. 01/15/23  Yes [provider]  nitroGLYCERIN (NITROSTAT) 0.4 MG SL tablet Place 1 tablet (0.4 mg total) under the tongue every 5 (five) minutes x 3 doses as needed for chest pain. 10/11/20  Yes Georgie Chard D, NP  ondansetron (ZOFRAN) 4 MG tablet Take 4 mg by mouth 3 (three) times daily as needed for nausea or vomiting. 11/22/20  Yes [provider]  pantoprazole (PROTONIX) 40 MG tablet Take 1 tablet (40 mg total) by mouth 2 (two) times daily. 09/05/22  Yes Bensimhon, Bevelyn Buckles, MD  potassium chloride (KLOR-CON) 10 MEQ tablet Take 1 tablet (10 mEq total) by mouth every Monday, Wednesday, and Friday. 01/12/23  Yes [place], [place], FNP  SYNTHROID 75 MCG tablet Take 75 mcg by mouth daily before breakfast. 10/18/22  Yes [provider]    Physical Exam: Vitals:   01/30/23 1704 01/30/23 1900 01/30/23 2015 01/30/23 2030  BP:  124/75  136/75  Pulse:   (!) 52 (!) 51  Resp:  17    Temp:      SpO2:   100% 100%  Weight: 47.6 kg     Height: 4\' 8"  (1.422 m)      Constitutional: NAD, calm, comfortable, chronically ill appearing elderly female lying upright in bed Eyes: lids and conjunctivae normal ENMT: Mucous membranes are moist.  Neck: normal, supple Respiratory: clear to auscultation bilaterally, no wheezing, no crackles. Normal respiratory effort. No accessory muscle use.  Cardiovascular: Regular rate and rhythm, no murmurs / rubs / gallops. +2 pitting edema of bilateral ankles Abdomen: no tenderness, soft Musculoskeletal: no clubbing / cyanosis. No joint deformity upper and lower extremities. Muscle wasting on all extremity Skin: blood filled blister to right palm just distal to wrist.  Neurologic: CN 2-12 grossly intact.  Psychiatric: Normal judgment and insight. Alert and oriented x 3. Normal mood.   Data  Reviewed:  See HPI  Assessment and Plan: * Acute exacerbation of CHF (congestive heart failure) (HCC) -NYHA IIb-III -last echocardiogram 10/2022 with LVEF of 55-60% with grade II diastolic dysfunction, severe TV regurgitation.  -Off Entresto, spiro and Jardiance with recurrent AKI and hyperkalemia -recently started on Lasix 20mg  MWF last month -continue IV 40mg  lasix daily  -strict intake and output, daily weights  Recurrent right pleural effusion -hx of thoracentesis on 1/24. 5/24, 7/24 and 9/24. Pleurex has been discussed in the past but she declined.  -following her last hospitalization in September she was placed on 20mg  Lasix 3x a week  -will do daily IV lasix here   Paroxysmal atrial fibrillation (HCC) S/p ablation 2023 -continue amiodarone BID -continue Eliquis -follow for AF clinic  Chronic kidney disease, stage 3a (HCC) -creatinine stable  -not on Entresto or Jardiance due  to AKI in the past   Elevated LFTs -likely hepatic congestion from CHF exacerbation  HLD (hyperlipidemia) -continue statin   Hypothyroidism -TSH elevated 3 weeks ago but reportedly was previously off her levothyroxine. Will repeat.       Advance Care Planning: Limited-CPR but no intubation  Consults: none  Family Communication: grand-daughter at bedside  Severity of Illness: The appropriate patient status for this patient is INPATIENT. Inpatient status is judged to be reasonable and necessary in order to provide the required intensity of service to ensure the patient's safety. The patient's presenting symptoms, physical exam findings, and initial radiographic and laboratory data in the context of their chronic comorbidities is felt to place them at high risk for further clinical deterioration. Furthermore, it is not anticipated that the patient will be medically stable for discharge from the hospital within 2 midnights of admission.   * I certify that at the point of admission it is my clinical judgment that the patient will require inpatient hospital care spanning beyond 2 midnights from the point of admission due to high intensity of service, high risk for further deterioration and high frequency of surveillance required.*  Author: Anselm Jungling, DO 01/30/2023 9:42 PM  For on call review [email or web address].

## 2023-01-30 NOTE — ED Notes (Signed)
Snack given.

## 2023-01-30 NOTE — Assessment & Plan Note (Signed)
-  creatinine stable  -not on Entresto or Jardiance due to AKI in the past

## 2023-01-30 NOTE — Assessment & Plan Note (Signed)
-  likely hepatic congestion from CHF exacerbation

## 2023-01-30 NOTE — Assessment & Plan Note (Signed)
-  TSH elevated 3 weeks ago but reportedly was previously off her levothyroxine. Will repeat.

## 2023-01-30 NOTE — ED Notes (Signed)
Patient transported to X-ray 

## 2023-01-30 NOTE — ED Notes (Signed)
Pt refusing to wear cardiac leads or supplemental O2. Pulse ox connected to finger but not registering on monitor. Pt refuse to have it placed anywhere else. Pt educated about the risk involved; continues to refuse.

## 2023-01-30 NOTE — Assessment & Plan Note (Signed)
continue statin

## 2023-01-30 NOTE — Assessment & Plan Note (Addendum)
-  hx of thoracentesis on 1/24. 5/24, 7/24 and 9/24. Pleurex has been discussed in the past but she declined.  -following her last hospitalization in September she was placed on 20mg  Lasix 3x a week  -will do daily IV lasix here

## 2023-01-30 NOTE — ED Provider Notes (Signed)
[place] EMERGENCY DEPARTMENT AT [facility] Provider Note  CSN: [id number] Arrival date & time: 01/30/23 1701  Chief Complaint(s) Shortness of Breath  HPI Laurie James is a 77 y.o. female here today for shortness of breath.  Symptoms began last evening.  Patient was hospitalized for pneumonia and pleural effusion in the right lung 1 month ago.   Past Medical History Past Medical History:  Diagnosis Date   AMD (age related macular degeneration)    COPD (chronic obstructive pulmonary disease) (HCC)    Depression    Hiatal hernia    Intermittent low back pain    Memory impairment    Midsternal chest pain    a. 12/2011 Cardiac CTA Ca++ score of 103.3 (80th %), LAD <50p/m, RCA 50-75.   Osteoporosis    PAF (paroxysmal atrial fibrillation) (HCC) 09/2020   Paroxysmal atrial fibrillation with RVR (HCC) 11/28/2021   Vitamin B 12 deficiency    Patient Active Problem List   Diagnosis Date Noted   Community acquired pneumonia 12/27/2022   Acute CHF (congestive heart failure) (HCC) 10/27/2022   Chronic kidney disease, stage 3a (HCC) 09/08/2022   Heart failure (HCC) 09/06/2022   Chronic diastolic CHF (congestive heart failure) (HCC) 09/05/2022   GERD without esophagitis 05/02/2022   Gout 05/02/2022   Left lower quadrant abdominal pain 05/02/2022   Elevated LFTs 05/02/2022   Hyponatremia 05/02/2022   Recurrent right pleural effusion 05/01/2022   Paroxysmal atrial fibrillation (HCC) 05/01/2022   Secondary hypercoagulable state (HCC) 04/04/2022   Chronic systolic CHF (congestive heart failure) (HCC) 11/28/2021   Macular degeneration 11/28/2021   MCI (mild cognitive impairment) 11/28/2021   OSA (obstructive sleep apnea)    Protein-calorie malnutrition, severe 10/22/2020   Depression    Bilateral pleural effusion 10/19/2020   Pulmonary HTN (HCC) 10/11/2020   Tricuspid regurgitation 10/11/2020   HLD (hyperlipidemia) 10/11/2020   Hypotension 10/11/2020    Hypokalemia 10/11/2020   Chest pain at rest 10/05/2020   CAD (coronary artery disease) 04/24/2014   COPD with acute exacerbation (HCC) 04/24/2014   Home Medication(s) Prior to Admission medications   Medication Sig Start Date End Date Taking? Authorizing Provider  albuterol (VENTOLIN HFA) 108 (90 Base) MCG/ACT inhaler Inhale 2 puffs into the lungs every 4 (four) hours as needed for wheezing. 06/08/20  Yes [provider]  amiodarone (PACERONE) 200 MG tablet Take 1 tablet (200 mg total) by mouth 2 (two) times daily for 30 days, THEN 1 tablet (200 mg total) daily. 01/10/23 02/09/24 Yes Eustace Pen, PA-C  apixaban Everlene Balls) 2.5 MG TABS tablet Take 1 tablet by mouth twice daily 01/30/23  Yes Bensimhon, Bevelyn Buckles, MD  atorvastatin (LIPITOR) 40 MG tablet Take 1 tablet by mouth once daily 09/21/22  Yes Wendall Stade, MD  budesonide-formoterol (SYMBICORT) 80-4.5 MCG/ACT inhaler Inhale 2 puffs into the lungs in the morning and at bedtime. 10/30/22  Yes Briant Cedar, MD  cyanocobalamin (,VITAMIN B-12,) 1000 MCG/ML injection Inject 1,000 mcg into the muscle every 30 (thirty) days. 06/11/16  Yes [provider]  furosemide (LASIX) 20 MG tablet Take 1 tablet (20 mg total) by mouth every Monday, Wednesday, and Friday. Leg edema 01/12/23  Yes [place], [place]isman, FNP  mupirocin ointment (BACTROBAN) 2 % Apply 1 Application topically daily. 01/15/23  Yes [provider]  nitroGLYCERIN (NITROSTAT) 0.4 MG SL tablet Place 1 tablet (0.4 mg total) under the tongue every 5 (five) minutes x 3 doses as needed for chest pain. 10/11/20  Yes Georgie Chard D, NP  ondansetron (ZOFRAN) 4 MG tablet Take 4 mg by mouth 3 (three) times daily as needed for nausea or vomiting. 11/22/20  Yes [provider]  pantoprazole (PROTONIX) 40 MG tablet Take 1 tablet (40 mg total) by mouth 2 (two) times daily. 09/05/22  Yes Bensimhon, Bevelyn Buckles, MD  potassium chloride (KLOR-CON) 10 MEQ tablet Take 1 tablet  (10 mEq total) by mouth every Monday, Wednesday, and Friday. 01/12/23  Yes [place], [place], FNP  SYNTHROID 75 MCG tablet Take 75 mcg by mouth daily before breakfast. 10/18/22  Yes [provider]                                                                                                                                    Past Surgical History Past Surgical History:  Procedure Laterality Date   ABDOMINAL HYSTERECTOMY     ATRIAL FIBRILLATION ABLATION N/A 03/08/2022   Procedure: ATRIAL FIBRILLATION ABLATION;  Surgeon: Maurice Small, MD;  [facility]: [facility];  Service: Cardiovascular;  Laterality: N/A;   IR THORACENTESIS ASP PLEURAL SPACE W/IMG GUIDE  10/20/2020   IR THORACENTESIS ASP PLEURAL SPACE W/IMG GUIDE  05/02/2022   IR THORACENTESIS ASP PLEURAL SPACE W/IMG GUIDE  09/07/2022   IR THORACENTESIS ASP PLEURAL SPACE W/IMG GUIDE  12/27/2022   RIGHT HEART CATH N/A 09/06/2022   Procedure: RIGHT HEART CATH;  Surgeon: Dorthula Nettles, DO;  Location: [facility];  Service: Cardiovascular;  Laterality: N/A;   RIGHT/LEFT HEART CATH AND CORONARY ANGIOGRAPHY N/A 10/07/2020   Procedure: RIGHT/LEFT HEART CATH AND CORONARY ANGIOGRAPHY;  Surgeon: Runell Gess, MD;  Location: [facility];  Service: Cardiovascular;  Laterality: N/A;   TEE WITHOUT CARDIOVERSION N/A 10/25/2020   Procedure: TRANSESOPHAGEAL ECHOCARDIOGRAM (TEE);  Surgeon: Quintella Reichert, MD;  Location: [facility] ENDOSCOPY;  Service: Cardiovascular;  Laterality: N/A;   Family History Family History  Problem Relation Age of Onset   Breast cancer Sister    Breast cancer Sister     Social History Social History   Tobacco Use   Smoking status: Former    Current packs/day: 0.00    Average packs/day: 1 pack/day for 65.0 years (65.0 ttl pk-yrs)    Types: Cigarettes    Start date: 09/1955    Quit date: 09/2020    Years since quitting: 2.3   Smokeless tobacco: Never   Tobacco comments:    Former smoker  04/04/22  Vaping Use   Vaping status: Never Used  Substance Use Topics   Alcohol use: No    Alcohol/week: 0.0 standard drinks of alcohol    Comment: rarely   Drug use: No   Allergies Codeine  Review of Systems Review of Systems  Physical Exam Vital Signs  I have reviewed the triage vital signs BP (!) 145/71 (BP Location: Right Arm)   Pulse (!) 57   Temp 97.7 F (36.5  C)   Resp 15   Ht 4\' 8"  (1.422 m)   Wt 47.6 kg   SpO2 100%   BMI 23.53 kg/m   Physical Exam Vitals reviewed.  Cardiovascular:     Rate and Rhythm: Normal rate.  Pulmonary:     Breath sounds: Examination of the right-lower field reveals decreased breath sounds. Decreased breath sounds present. No wheezing or rhonchi.  Neurological:     Mental Status: She is alert.     ED Results and Treatments Labs (all labs ordered are listed, but only abnormal results are displayed) Labs Reviewed  CBC - Abnormal; Notable for the following components:      Result Value   RBC 3.22 (*)    Hemoglobin 8.4 (*)    HCT 28.4 (*)    MCHC 29.6 (*)    RDW 20.1 (*)    All other components within normal limits  RESP PANEL BY RT-PCR (RSV, FLU A&B, COVID)  RVPGX2  COMPREHENSIVE METABOLIC PANEL  BRAIN NATRIURETIC PEPTIDE                                                                                                                          Radiology No results found.  Pertinent labs & imaging results that were available during my care of the patient were reviewed by me and considered in my medical decision making (see MDM for details).  Medications Ordered in ED Medications - No data to display                                                                                                                                   Procedures Ultrasound ED Echo  Date/Time: 01/30/2023 8:14 PM  Performed by: Arletha Pili, DO Authorized by: Arletha Pili, DO   Procedure details:    Indications: dyspnea     Views:  subxiphoid, parasternal long axis view, parasternal short axis view and IVC view     Images: archived   Findings:    Pericardium: no pericardial effusion     LV Function: severly depressed (<30%)     RV Diameter: normal     IVC: dilated   Impression:    Impression: decreased contractility     (including critical care time)  Medical Decision Making / ED Course   This patient presents to the ED for concern of shortness of breath, this involves an extensive number  of treatment options, and is a complaint that carries with it a high risk of complications and morbidity.  The differential diagnosis includes pleural effusion, pneumonia, less likely PE, less likely ACS.  MDM: 77 year old female here today with shortness of breath.  Bedside ultrasound does show a pleural effusion on the right side.  Do not see any consolidation.  Suspect this is likely the cause.  Patient is not taking diuretics daily.  Believe she would benefit from medical management.  She has required thoracentesis in the past.  Reassessment-patient with no leukocytosis.  She has not had fever.  This appears to be a simple pleural effusion.  Patient's O2 drops with ambulation.  Will provide IV Lasix.  Will admit to hospitalist.  Additional history obtained:  -External records from outside source obtained and reviewed including: Chart review including previous notes, labs, imaging, consultation notes   Lab Tests: -I ordered, reviewed, and interpreted labs.   The pertinent results include:   Labs Reviewed  CBC - Abnormal; Notable for the following components:      Result Value   RBC 3.22 (*)    Hemoglobin 8.4 (*)    HCT 28.4 (*)    MCHC 29.6 (*)    RDW 20.1 (*)    All other components within normal limits  RESP PANEL BY RT-PCR (RSV, FLU A&B, COVID)  [id number]  COMPREHENSIVE METABOLIC PANEL  BRAIN NATRIURETIC PEPTIDE      EKG no evidence of acute ischemia.  EKG Interpretation Date/Time:    Ventricular Rate:     PR Interval:    QRS Duration:    QT Interval:    QTC Calculation:   R Axis:      Text Interpretation:           Imaging Studies ordered: I ordered imaging studies including chest x-ray.  Pleural effusion. I independently visualized and interpreted imaging. I agree with the radiologist interpretation   Medicines ordered and prescription drug management: No orders of the defined types were placed in this encounter.   -I have reviewed the patients home medicines and have made adjustments as needed  Consultations Obtained: I requested consultation with the hospitalist,  and discussed lab and imaging findings as well as pertinent plan - they recommend: Admission   Cardiac Monitoring: The patient was maintained on a cardiac monitor.  I personally viewed and interpreted the cardiac monitored which showed an underlying rhythm of: Normal sinus rhythm    Reevaluation: After the interventions noted above, I reevaluated the patient and found that they have :improved  Co morbidities that complicate the patient evaluation  Past Medical History:  Diagnosis Date   AMD (age related macular degeneration)    COPD (chronic obstructive pulmonary disease) (HCC)    Depression    Hiatal hernia    Intermittent low back pain    Memory impairment    Midsternal chest pain    a. 12/2011 Cardiac CTA Ca++ score of 103.3 (80th %), LAD <50p/m, RCA 50-75.   Osteoporosis    PAF (paroxysmal atrial fibrillation) (HCC) 09/2020   Paroxysmal atrial fibrillation with RVR (HCC) 11/28/2021   Vitamin B 12 deficiency       Dispostion: Admit to hospitalist.     Final Clinical Impression(s) / ED Diagnoses Final diagnoses:  None     @PCDICTATION @    Anders Simmonds T, DO 01/30/23 2018

## 2023-01-31 ENCOUNTER — Inpatient Hospital Stay (HOSPITAL_COMMUNITY)
Admission: EM | Admit: 2023-01-31 | Discharge: 2023-02-05 | DRG: 481 | Disposition: A | Discharge status: Skilled Nursing Facility | Payer: Medicare PPO | Attending: Internal Medicine | Admitting: Internal Medicine

## 2023-01-31 ENCOUNTER — Emergency Department (HOSPITAL_COMMUNITY): Payer: Medicare PPO

## 2023-01-31 ENCOUNTER — Other Ambulatory Visit: Payer: Self-pay

## 2023-01-31 DIAGNOSIS — R001 Bradycardia, unspecified: Secondary | ICD-10-CM | POA: Diagnosis present

## 2023-01-31 DIAGNOSIS — D72829 Elevated white blood cell count, unspecified: Secondary | ICD-10-CM | POA: Diagnosis present

## 2023-01-31 DIAGNOSIS — Z1152 Encounter for screening for COVID-19: Secondary | ICD-10-CM | POA: Diagnosis not present

## 2023-01-31 DIAGNOSIS — I2510 Atherosclerotic heart disease of native coronary artery without angina pectoris: Secondary | ICD-10-CM | POA: Diagnosis present

## 2023-01-31 DIAGNOSIS — S72141A Displaced intertrochanteric fracture of right femur, initial encounter for closed fracture: Principal | ICD-10-CM | POA: Diagnosis present

## 2023-01-31 DIAGNOSIS — M25572 Pain in left ankle and joints of left foot: Secondary | ICD-10-CM | POA: Diagnosis not present

## 2023-01-31 DIAGNOSIS — I5042 Chronic combined systolic (congestive) and diastolic (congestive) heart failure: Secondary | ICD-10-CM | POA: Diagnosis not present

## 2023-01-31 DIAGNOSIS — Z66 Do not resuscitate: Secondary | ICD-10-CM | POA: Diagnosis not present

## 2023-01-31 DIAGNOSIS — Z79890 Hormone replacement therapy (postmenopausal): Secondary | ICD-10-CM

## 2023-01-31 DIAGNOSIS — M25551 Pain in right hip: Secondary | ICD-10-CM | POA: Diagnosis not present

## 2023-01-31 DIAGNOSIS — K219 Gastro-esophageal reflux disease without esophagitis: Secondary | ICD-10-CM | POA: Diagnosis present

## 2023-01-31 DIAGNOSIS — R6889 Other general symptoms and signs: Secondary | ICD-10-CM | POA: Diagnosis not present

## 2023-01-31 DIAGNOSIS — Z885 Allergy status to narcotic agent status: Secondary | ICD-10-CM

## 2023-01-31 DIAGNOSIS — M109 Gout, unspecified: Secondary | ICD-10-CM | POA: Diagnosis present

## 2023-01-31 DIAGNOSIS — H3530 Unspecified macular degeneration: Secondary | ICD-10-CM | POA: Diagnosis present

## 2023-01-31 DIAGNOSIS — S72001A Fracture of unspecified part of neck of right femur, initial encounter for closed fracture: Principal | ICD-10-CM

## 2023-01-31 DIAGNOSIS — I517 Cardiomegaly: Secondary | ICD-10-CM | POA: Diagnosis not present

## 2023-01-31 DIAGNOSIS — I5033 Acute on chronic diastolic (congestive) heart failure: Secondary | ICD-10-CM | POA: Diagnosis present

## 2023-01-31 DIAGNOSIS — Y92009 Unspecified place in unspecified non-institutional (private) residence as the place of occurrence of the external cause: Secondary | ICD-10-CM | POA: Diagnosis not present

## 2023-01-31 DIAGNOSIS — N179 Acute kidney failure, unspecified: Secondary | ICD-10-CM | POA: Diagnosis present

## 2023-01-31 DIAGNOSIS — E039 Hypothyroidism, unspecified: Secondary | ICD-10-CM | POA: Diagnosis present

## 2023-01-31 DIAGNOSIS — N1831 Chronic kidney disease, stage 3a: Secondary | ICD-10-CM | POA: Diagnosis present

## 2023-01-31 DIAGNOSIS — Z803 Family history of malignant neoplasm of breast: Secondary | ICD-10-CM

## 2023-01-31 DIAGNOSIS — M810 Age-related osteoporosis without current pathological fracture: Secondary | ICD-10-CM | POA: Diagnosis present

## 2023-01-31 DIAGNOSIS — J449 Chronic obstructive pulmonary disease, unspecified: Secondary | ICD-10-CM | POA: Diagnosis present

## 2023-01-31 DIAGNOSIS — I272 Pulmonary hypertension, unspecified: Secondary | ICD-10-CM | POA: Diagnosis present

## 2023-01-31 DIAGNOSIS — I071 Rheumatic tricuspid insufficiency: Secondary | ICD-10-CM | POA: Diagnosis present

## 2023-01-31 DIAGNOSIS — I2489 Other forms of acute ischemic heart disease: Secondary | ICD-10-CM | POA: Diagnosis present

## 2023-01-31 DIAGNOSIS — S72001D Fracture of unspecified part of neck of right femur, subsequent encounter for closed fracture with routine healing: Secondary | ICD-10-CM | POA: Diagnosis not present

## 2023-01-31 DIAGNOSIS — I480 Paroxysmal atrial fibrillation: Secondary | ICD-10-CM | POA: Diagnosis present

## 2023-01-31 DIAGNOSIS — G4733 Obstructive sleep apnea (adult) (pediatric): Secondary | ICD-10-CM | POA: Diagnosis present

## 2023-01-31 DIAGNOSIS — I959 Hypotension, unspecified: Secondary | ICD-10-CM | POA: Diagnosis not present

## 2023-01-31 DIAGNOSIS — I5032 Chronic diastolic (congestive) heart failure: Secondary | ICD-10-CM | POA: Diagnosis present

## 2023-01-31 DIAGNOSIS — E785 Hyperlipidemia, unspecified: Secondary | ICD-10-CM | POA: Diagnosis present

## 2023-01-31 DIAGNOSIS — E875 Hyperkalemia: Secondary | ICD-10-CM | POA: Diagnosis present

## 2023-01-31 DIAGNOSIS — Z7901 Long term (current) use of anticoagulants: Secondary | ICD-10-CM

## 2023-01-31 DIAGNOSIS — E440 Moderate protein-calorie malnutrition: Secondary | ICD-10-CM | POA: Diagnosis present

## 2023-01-31 DIAGNOSIS — Z6824 Body mass index (BMI) 24.0-24.9, adult: Secondary | ICD-10-CM

## 2023-01-31 DIAGNOSIS — I130 Hypertensive heart and chronic kidney disease with heart failure and stage 1 through stage 4 chronic kidney disease, or unspecified chronic kidney disease: Secondary | ICD-10-CM | POA: Diagnosis present

## 2023-01-31 DIAGNOSIS — W010XXA Fall on same level from slipping, tripping and stumbling without subsequent striking against object, initial encounter: Secondary | ICD-10-CM | POA: Diagnosis present

## 2023-01-31 DIAGNOSIS — Z7951 Long term (current) use of inhaled steroids: Secondary | ICD-10-CM

## 2023-01-31 DIAGNOSIS — J90 Pleural effusion, not elsewhere classified: Secondary | ICD-10-CM | POA: Diagnosis not present

## 2023-01-31 DIAGNOSIS — Z043 Encounter for examination and observation following other accident: Secondary | ICD-10-CM | POA: Diagnosis not present

## 2023-01-31 DIAGNOSIS — Z87891 Personal history of nicotine dependence: Secondary | ICD-10-CM

## 2023-01-31 DIAGNOSIS — W19XXXA Unspecified fall, initial encounter: Secondary | ICD-10-CM | POA: Diagnosis not present

## 2023-01-31 DIAGNOSIS — S72009A Fracture of unspecified part of neck of unspecified femur, initial encounter for closed fracture: Secondary | ICD-10-CM | POA: Diagnosis present

## 2023-01-31 DIAGNOSIS — R918 Other nonspecific abnormal finding of lung field: Secondary | ICD-10-CM | POA: Diagnosis not present

## 2023-01-31 DIAGNOSIS — D62 Acute posthemorrhagic anemia: Secondary | ICD-10-CM | POA: Diagnosis not present

## 2023-01-31 DIAGNOSIS — Z79899 Other long term (current) drug therapy: Secondary | ICD-10-CM

## 2023-01-31 DIAGNOSIS — Z7401 Bed confinement status: Secondary | ICD-10-CM | POA: Diagnosis not present

## 2023-01-31 HISTORY — DX: Fracture of unspecified part of neck of unspecified femur, initial encounter for closed fracture: S72.009A

## 2023-01-31 LAB — BASIC METABOLIC PANEL
Anion gap: 11 (ref 5–15)
Anion gap: 13 (ref 5–15)
BUN: 23 mg/dL (ref 8–23)
BUN: 27 mg/dL — ABNORMAL HIGH (ref 8–23)
CO2: 22 mmol/L (ref 22–32)
CO2: 22 mmol/L (ref 22–32)
Calcium: 8.3 mg/dL — ABNORMAL LOW (ref 8.9–10.3)
Calcium: 8.6 mg/dL — ABNORMAL LOW (ref 8.9–10.3)
Chloride: 103 mmol/L (ref 98–111)
Chloride: 100 mmol/L (ref 98–111)
Creatinine, Ser: 1.26 mg/dL — ABNORMAL HIGH (ref 0.44–1.00)
Creatinine, Ser: 1.39 mg/dL — ABNORMAL HIGH (ref 0.44–1.00)
GFR, Estimated: 44 mL/min — ABNORMAL LOW (ref >60)
GFR, Estimated: 39 mL/min — ABNORMAL LOW (ref >60)
Glucose, Bld: 100 mg/dL — ABNORMAL HIGH (ref 70–99)
Glucose, Bld: 120 mg/dL — ABNORMAL HIGH (ref 70–99)
Potassium: 3.7 mmol/L (ref 3.5–5.1)
Potassium: 4.2 mmol/L (ref 3.5–5.1)
Sodium: 136 mmol/L (ref 135–145)
Sodium: 135 mmol/L (ref 135–145)

## 2023-01-31 LAB — CBC
HCT: 26.8 % — ABNORMAL LOW (ref 36.0–46.0)
Hemoglobin: 8.2 g/dL — ABNORMAL LOW (ref 12.0–15.0)
MCH: 25.5 pg — ABNORMAL LOW (ref 26.0–34.0)
MCHC: 30.6 g/dL (ref 30.0–36.0)
MCV: 83.5 fL (ref 80.0–100.0)
Platelets: 345 10*3/uL (ref 150–400)
RBC: 3.21 MIL/uL — ABNORMAL LOW (ref 3.87–5.11)
RDW: 20.2 % — ABNORMAL HIGH (ref 11.5–15.5)
WBC: 7.9 10*3/uL (ref 4.0–10.5)
nRBC: 0 % (ref 0.0–0.2)

## 2023-01-31 LAB — CBC WITH DIFFERENTIAL/PLATELET
Abs Immature Granulocytes: 0 10*3/uL (ref 0.00–0.07)
Basophils Absolute: 0 10*3/uL (ref 0.0–0.1)
Basophils Relative: 0 %
Eosinophils Absolute: 0.8 10*3/uL — ABNORMAL HIGH (ref 0.0–0.5)
Eosinophils Relative: 6 %
HCT: 26.2 % — ABNORMAL LOW (ref 36.0–46.0)
Hemoglobin: 8.1 g/dL — ABNORMAL LOW (ref 12.0–15.0)
Lymphocytes Relative: 3 %
Lymphs Abs: 0.4 10*3/uL — ABNORMAL LOW (ref 0.7–4.0)
MCH: 25.8 pg — ABNORMAL LOW (ref 26.0–34.0)
MCHC: 30.9 g/dL (ref 30.0–36.0)
MCV: 83.4 fL (ref 80.0–100.0)
Monocytes Absolute: 0 10*3/uL — ABNORMAL LOW (ref 0.1–1.0)
Monocytes Relative: 0 %
Neutro Abs: 11.6 10*3/uL — ABNORMAL HIGH (ref 1.7–7.7)
Neutrophils Relative %: 91 %
Platelets: 374 10*3/uL (ref 150–400)
RBC: 3.14 MIL/uL — ABNORMAL LOW (ref 3.87–5.11)
RDW: 20.3 % — ABNORMAL HIGH (ref 11.5–15.5)
WBC: 12.7 10*3/uL — ABNORMAL HIGH (ref 4.0–10.5)
nRBC: 0 % (ref 0.0–0.2)
nRBC: 0 /100{WBCs}

## 2023-01-31 LAB — PROTIME-INR
INR: 1.8 — ABNORMAL HIGH (ref 0.8–1.2)
Prothrombin Time: 21.3 s — ABNORMAL HIGH (ref 11.4–15.2)

## 2023-01-31 LAB — ABO/RH: ABO/RH(D): O NEG

## 2023-01-31 LAB — TSH: TSH: 6.653 u[IU]/mL — ABNORMAL HIGH (ref 0.350–4.500)

## 2023-01-31 LAB — MAGNESIUM: Magnesium: 1.9 mg/dL (ref 1.7–2.4)

## 2023-01-31 MED ORDER — ONDANSETRON 4 MG PO TBDP
4.0000 mg | ORAL_TABLET | Freq: Once | ORAL | Status: AC | PRN
  Administered 2023-01-31: 4 mg via ORAL
  Filled 2023-01-31: qty 1

## 2023-01-31 MED ORDER — ATORVASTATIN CALCIUM 40 MG PO TABS: ORAL_TABLET | ORAL | Status: DC

## 2023-01-31 MED ORDER — FENTANYL CITRATE PF 50 MCG/ML IJ SOSY
50.0000 ug | PREFILLED_SYRINGE | INTRAMUSCULAR | Status: AC | PRN
  Administered 2023-01-31 – 2023-02-01 (×2): 50 ug via INTRAVENOUS
  Filled 2023-01-31 (×2): qty 1

## 2023-01-31 MED ORDER — ONDANSETRON HCL 4 MG/2ML IJ SOLN
4.0000 mg | Freq: Once | INTRAMUSCULAR | Status: AC
  Administered 2023-01-31: 4 mg via INTRAVENOUS
  Filled 2023-01-31: qty 2

## 2023-01-31 NOTE — ED Notes (Signed)
ED TO INPATIENT HANDOFF REPORT  ED Nurse Name and Phone #: Gillis Ends #[phone]  S Name/Age/Gender Laurie James 77 y.o. female Room/Bed: 009C/009C  Code Status   Code Status: Prior  Home/SNF/Other Home Patient oriented to: self, place, time, and situation Is this baseline? Yes   Triage Complete: Triage complete  Chief Complaint Acute exacerbation of CHF (congestive heart failure) (HCC) [I50.9]  Triage Note Ems called out for SOB. Pt has hx of COPD , CHF does not wear o2 at home. With EMS o2 dropped to low 80s. Pt refused to wear [place].Denies CP. Bp 153/64, P 61, 98% on room air without exertion.    Allergies Allergies  Allergen Reactions   Codeine Nausea And Vomiting and Other (See Comments)    Upsets the stomach    Level of Care/Admitting Diagnosis ED Disposition     ED Disposition  Admit   Condition  --   Comment  Hospital Area: [facility] [[id number]]  Level of Care: Telemetry Cardiac [[id number]]  May admit patient to [facility] or [facility] if equivalent level of care is available:: No  Covid Evaluation: Asymptomatic - no recent exposure (last 10 days) testing not required  Diagnosis: Acute exacerbation of CHF (congestive heart failure) [facility]) [[id number]]  Admitting Physician: Anselm Jungling [[id number]]  Attending Physician: Anselm Jungling [[id number]]  Certification:: I certify this patient will need inpatient services for at least 2 midnights  Expected Medical Readiness: 02/01/2023          B Medical/Surgery History Past Medical History:  Diagnosis Date   AMD (age related macular degeneration)    COPD (chronic obstructive pulmonary disease) (HCC)    Depression    Hiatal hernia    Intermittent low back pain    Memory impairment    Midsternal chest pain    a. 12/2011 Cardiac CTA Ca++ score of 103.3 (80th %), LAD <50p/m, RCA 50-75.   Osteoporosis    PAF (paroxysmal atrial fibrillation) (HCC) 09/2020   Paroxysmal atrial fibrillation with RVR  (HCC) 11/28/2021   Vitamin B 12 deficiency    Past Surgical History:  Procedure Laterality Date   ABDOMINAL HYSTERECTOMY     ATRIAL FIBRILLATION ABLATION N/A 03/08/2022   Procedure: ATRIAL FIBRILLATION ABLATION;  Surgeon: Maurice Small, MD;  [facility];  Service: Cardiovascular;  Laterality: N/A;   IR THORACENTESIS ASP PLEURAL SPACE W/IMG GUIDE  10/20/2020   IR THORACENTESIS ASP PLEURAL SPACE W/IMG GUIDE  05/02/2022   IR THORACENTESIS ASP PLEURAL SPACE W/IMG GUIDE  09/07/2022   IR THORACENTESIS ASP PLEURAL SPACE W/IMG GUIDE  12/27/2022   RIGHT HEART CATH N/A 09/06/2022   Procedure: RIGHT HEART CATH;  Surgeon: Dorthula Nettles, DO;  [facility];  Service: Cardiovascular;  Laterality: N/A;   RIGHT/LEFT HEART CATH AND CORONARY ANGIOGRAPHY N/A 10/07/2020   Procedure: RIGHT/LEFT HEART CATH AND CORONARY ANGIOGRAPHY;  Surgeon: Runell Gess, MD;  [facility];  Service: Cardiovascular;  Laterality: N/A;   TEE WITHOUT CARDIOVERSION N/A 10/25/2020   Procedure: TRANSESOPHAGEAL ECHOCARDIOGRAM (TEE);  Surgeon: Quintella Reichert, MD;  Location: [facility] ENDOSCOPY;  Service: Cardiovascular;  Laterality: N/A;     A IV Location/Drains/Wounds Patient Lines/Drains/Airways Status     Active Line/Drains/Airways     Name Placement date Placement time Site Days   Peripheral IV 01/30/23 20 G Left Antecubital 01/30/23  2036  Antecubital  1   Wound / Incision (Open or Dehisced) 10/20/20 Skin tear Pretibial  Left 3cm skin tear to left shin 10/20/20  1055  Pretibial  833   Wound / Incision (Open or Dehisced) 09/05/22 Skin tear Ankle Anterior;Left Skin Tear 09/05/22  1745  Ankle  148            Intake/Output Last 24 hours No intake or output data in the 24 hours ending 01/31/23 0018  Labs/Imaging Results for orders placed or performed during the hospital encounter of 01/30/23 (from the past 48 hour(s))  Resp panel by RT-PCR (RSV, Flu A&B, Covid) Anterior  Nasal Swab     Status: None   Collection Time: 01/30/23  5:43 PM   Specimen: Anterior Nasal Swab  Result Value Ref Range   SARS Coronavirus 2 by RT PCR NEGATIVE NEGATIVE   Influenza A by PCR NEGATIVE NEGATIVE   Influenza B by PCR NEGATIVE NEGATIVE    Comment: (NOTE) The Xpert Xpress SARS-CoV-2/FLU/RSV plus assay is intended as an aid in the diagnosis of influenza from Nasopharyngeal swab specimens and should not be used as a sole basis for treatment. Nasal washings and aspirates are unacceptable for Xpert Xpress SARS-CoV-2/FLU/RSV testing.  Fact Sheet for Patients: BloggerCourse.com  Fact Sheet for Healthcare Providers: SeriousBroker.it  This test is not yet approved or cleared by the [place] FDA and has been authorized for detection and/or diagnosis of SARS-CoV-2 by FDA under an Emergency Use Authorization (EUA). This EUA will remain in effect (meaning this test can be used) for the duration of the COVID-19 declaration under Section 564(b)(1) of the Act, 21 U.S.C. section 360bbb-3(b)(1), unless the authorization is terminated or revoked.     Resp Syncytial Virus by PCR NEGATIVE NEGATIVE    Comment: (NOTE) Fact Sheet for Patients: BloggerCourse.com  Fact Sheet for Healthcare Providers: SeriousBroker.it  This test is not yet approved or cleared by the [place] FDA and has been authorized for detection and/or diagnosis of SARS-CoV-2 by FDA under an Emergency Use Authorization (EUA). This EUA will remain in effect (meaning this test can be used) for the duration of the COVID-19 declaration under Section 564(b)(1) of the Act, 21 U.S.C. section 360bbb-3(b)(1), unless the authorization is terminated or revoked.  Performed at [facility], [address], [place], [place] [address]   Comprehensive metabolic panel     Status: Abnormal   Collection Time: 01/30/23   5:43 PM  Result Value Ref Range   Sodium 135 135 - 145 mmol/L   Potassium 4.2 3.5 - 5.1 mmol/L   Chloride 108 98 - 111 mmol/L   CO2 20 (L) 22 - 32 mmol/L   Glucose, Bld 98 70 - 99 mg/dL    Comment: Glucose reference range applies only to samples taken after fasting for at least 8 hours.   BUN 24 (H) 8 - 23 mg/dL   Creatinine, Ser 6.04 (H) 0.44 - 1.00 mg/dL   Calcium 8.4 (L) 8.9 - 10.3 mg/dL   Total Protein 7.3 6.5 - 8.1 g/dL   Albumin 2.8 (L) 3.5 - 5.0 g/dL   AST 95 (H) 15 - 41 U/L   ALT 84 (H) 0 - 44 U/L   Alkaline Phosphatase 79 38 - 126 U/L   Total Bilirubin 1.1 0.3 - 1.2 mg/dL   GFR, Estimated 49 (L) >60 mL/min    Comment: (NOTE) Calculated using the CKD-EPI Creatinine Equation (2021)    Anion gap 7 5 - 15    Comment: Performed at [facility], [address], [place], [place] [address]  CBC  Status: Abnormal   Collection Time: 01/30/23  5:43 PM  Result Value Ref Range   WBC 8.7 4.0 - 10.5 K/uL   RBC 3.22 (L) 3.87 - 5.11 MIL/uL   Hemoglobin 8.4 (L) 12.0 - 15.0 g/dL   HCT 86.5 (L) 78.4 - 69.6 %   MCV 88.2 80.0 - 100.0 fL   MCH 26.1 26.0 - 34.0 pg   MCHC 29.6 (L) 30.0 - 36.0 g/dL   RDW 29.5 (H) 28.4 - 13.2 %   Platelets 372 150 - 400 K/uL   nRBC 0.0 0.0 - 0.2 %    Comment: Performed at [facility], [address], [place], [place] [address]  Brain natriuretic peptide     Status: Abnormal   Collection Time: 01/30/23  5:43 PM  Result Value Ref Range   B Natriuretic Peptide 814.7 (H) 0.0 - 100.0 pg/mL    Comment: Performed at [facility], [address] [place], [place] [address]   DG Chest 1 View  Result Date: 01/30/2023 CLINICAL DATA:  Shortness of breath EXAM: CHEST  1 VIEW COMPARISON:  Chest x-ray 12/27/2022 FINDINGS: There are small bilateral pleural effusions. There is cardiomegaly and central pulmonary vascular congestion. No evidence for pneumothorax or acute fracture. IMPRESSION: 1. Cardiomegaly and central pulmonary vascular congestion.  2. Small bilateral pleural effusions. Electronically Signed   By: Darliss Cheney M.D.   On: 01/30/2023 20:18    Pending Labs Unresulted Labs (From admission, onward)     Start     Ordered   01/31/23 0500  TSH  Tomorrow morning,   R        01/30/23 2126            Vitals/Pain Today's Vitals   01/30/23 2030 01/30/23 2100 01/30/23 2130 01/30/23 2200  BP: 136/75 (!) 149/75 (!) 151/139 (!) 172/159  Pulse: (!) 51 (!) 57    Resp:   (!) 26 20  Temp:      SpO2: 100% 100%    Weight:      Height:      PainSc:        Isolation Precautions No active isolations  Medications Medications  furosemide (LASIX) injection 40 mg (has no administration in time range)  amiodarone (PACERONE) tablet 200 mg (200 mg Oral Given 01/30/23 2238)    Followed by  amiodarone (PACERONE) tablet 200 mg (has no administration in time range)  atorvastatin (LIPITOR) tablet 40 mg (has no administration in time range)  levothyroxine (SYNTHROID) tablet 75 mcg (has no administration in time range)  pantoprazole (PROTONIX) EC tablet 40 mg (40 mg Oral Given 01/30/23 2238)  apixaban (ELIQUIS) tablet 2.5 mg (2.5 mg Oral Given 01/30/23 2237)  potassium chloride (KLOR-CON M) CR tablet 10 mEq (has no administration in time range)  mometasone-formoterol (DULERA) 100-5 MCG/ACT inhaler 2 puff (2 puffs Inhalation Given 01/30/23 2323)  furosemide (LASIX) injection 60 mg (60 mg Intravenous Given 01/30/23 2036)    Mobility non-ambulatory     Focused Assessments    R Recommendations: See Admitting Provider Note  Report given to:   Additional Notes:

## 2023-01-31 NOTE — Evaluation (Signed)
Physical Therapy Evaluation Patient Details Name: Laurie James MRN: [id number] DOB: 01/25/1946 Today's Date: 01/31/2023  History of Present Illness  Pt is a 77 y.o. female presenting to [facility] 01/30/23 with increasing SOB w/ orthopnea and LE edema. Admitted with acute exacerbation of CHF and recurrent R pleural effusion Prior admission 9/18 with with moderate right and small left pleural effusion and possible RLL opacity. S/p R thoracentesis. PMH significant for blindness, diastolic CHF, severe TR, PAF on Eliquis, CAD, COPD, CKD-3 and HTN   Clinical Impression  Pt in bed upon arrival and agreeable to PT eval. Prior to admission, pt reports ambulating household distances while furniture surfing. If she needs to leave the house, she has physical assistance provided from family due to balance and vision deficits. Pt presents to therapy session close to mobility baseline with decreased LE strength, balance, mobility, and endurance. Pt was able to ambulate ~80 ft with 1HH support and CGA. Pt would benefit from acute skilled PT to address functional impairments. Recommending post-acute HHPT to work towards independence with mobility. Acute PT to follow.          If plan is discharge home, recommend the following: A little help with walking and/or transfers;Help with stairs or ramp for entrance;A little help with bathing/dressing/bathroom;Assist for transportation   Can travel by private vehicle    Yes    Equipment Recommendations None recommended by PT  Recommendations for Other Services  OT consult    Functional Status Assessment Patient has had a recent decline in their functional status and demonstrates the ability to make significant improvements in function in a reasonable and predictable amount of time.     Precautions / Restrictions Precautions Precautions: Fall Restrictions Weight Bearing Restrictions: No      Mobility  Bed Mobility Overal bed mobility: Modified  Independent      General bed mobility comments: w/ HOB elevated, uses rails    Transfers Overall transfer level: Needs assistance Equipment used: None Transfers: Sit to/from Stand Sit to Stand: Contact guard assist           General transfer comment: CGA for safety    Ambulation/Gait Ambulation/Gait assistance: Contact guard assist Gait Distance (Feet): 80 Feet Assistive device: 1 person hand held assist Gait Pattern/deviations: Step-through pattern, Shuffle Gait velocity: dec     General Gait Details: pt reaches for UE support, 1HH assist for vision deficits. Slow and steady        Balance Overall balance assessment: Needs assistance, Mild deficits observed, not formally tested Sitting-balance support: No upper extremity supported, Feet supported Sitting balance-Leahy Scale: Good     Standing balance support: Single extremity supported, During functional activity Standing balance-Leahy Scale: Poor Standing balance comment: requires UE support while ambulating          Pertinent Vitals/Pain Pain Assessment Pain Assessment: No/denies pain    Home Living Family/patient expects to be discharged to:: Private residence Living Arrangements: Spouse/significant other Available Help at Discharge: Family;Available 24 hours/day Type of Home: House Home Access: Stairs to enter Entrance Stairs-Rails: None Entrance Stairs-Number of Steps: 3   Home Layout: Two level;Able to live on main level with bedroom/bathroom Home Equipment: Rolling Walker (2 wheels);Cane - single point;Shower seat;Wheelchair - manual;Grab bars - tub/shower;Hospital bed Additional Comments: lives with her husband, daughter makes meals and brings them over and cleans house and son lives nearby    Prior Function Prior Level of Function : Needs assist       Physical Assist : Mobility (  physical);ADLs (physical) Mobility (physical): Gait;Stairs ADLs (physical): Bathing;Dressing Mobility Comments:  reports ind, household distances while furniture surfing. Family assist for mobility out of the house due to vision deficits and balance. Pt did not use AD ADLs Comments: Husband assists with shower transfers and occasionally with dressing. Husband cooks/cleans but pt does laundry and dishes     Extremity/Trunk Assessment   Upper Extremity Assessment Upper Extremity Assessment: Defer to OT evaluation    Lower Extremity Assessment Lower Extremity Assessment: Generalized weakness    Cervical / Trunk Assessment Cervical / Trunk Assessment: Kyphotic  Communication   Communication Communication: No apparent difficulties Cueing Techniques: Verbal cues  Cognition Arousal: Alert Behavior During Therapy: WFL for tasks assessed/performed Overall Cognitive Status: Within Functional Limits for tasks assessed         General Comments General comments (skin integrity, edema, etc.): VSS on RA     PT Assessment Patient needs continued PT services  PT Problem List Decreased strength;Decreased activity tolerance;Decreased balance;Decreased mobility;Cardiopulmonary status limiting activity       PT Treatment Interventions DME instruction;Stair training;Gait training;Therapeutic activities;Functional mobility training;Therapeutic exercise;Balance training;Neuromuscular re-education;Patient/family education    PT Goals (Current goals can be found in the Care Plan section)  Acute Rehab PT Goals Patient Stated Goal: to go home PT Goal Formulation: With patient Time For Goal Achievement: 02/14/23 Potential to Achieve Goals: Good    Frequency Min 1X/week        AM-PAC PT "6 Clicks" Mobility  Outcome Measure Help needed turning from your back to your side while in a flat bed without using bedrails?: None Help needed moving from lying on your back to sitting on the side of a flat bed without using bedrails?: None Help needed moving to and from a bed to a chair (including a wheelchair)?: A  Little Help needed standing up from a chair using your arms (e.g., wheelchair or bedside chair)?: A Little Help needed to walk in hospital room?: A Little Help needed climbing 3-5 steps with a railing? : A Lot 6 Click Score: 19    End of Session Equipment Utilized During Treatment: Gait belt Activity Tolerance: Patient tolerated treatment well Patient left: in bed;with call bell/phone within reach;with bed alarm set Nurse Communication: Mobility status PT Visit Diagnosis: Unsteadiness on feet (R26.81);Muscle weakness (generalized) (M62.81)    Time: 4098-1191 PT Time Calculation (min) (ACUTE ONLY): 16 min   Charges:   PT Evaluation $PT Eval Low Complexity: 1 Low   PT General Charges $$ ACUTE PT VISIT: 1 Visit         Hilton Cork, PT, DPT Secure Chat Preferred  Rehab Office [phone]   Arturo Morton Brion Aliment 01/31/2023, 11:11 AM

## 2023-01-31 NOTE — TOC Transition Note (Signed)
Transition of Care [facility]) - CM/SW Discharge Note   Patient Details  Name: Laurie James MRN: [id number] Date of Birth: 1945-05-27  Transition of Care [facility]) CM/SW Contact:  Leone Haven, RN Phone Number: 01/31/2023, 4:05 PM   Clinical Narrative:    For dc today, sats does not qualify for oxygen.  She is et up with [facility] for [facility] services.  Daughter at bedside, Tresa Endo will transport her home today.    Final next level of care: Home w Home Health Services Barriers to Discharge: No Barriers Identified   Patient Goals and CMS Choice CMS Medicare.gov Compare Post Acute Care list provided to:: Patient Represenative (must comment) Choice offered to / list presented to : Adult Children  Discharge Placement                         Discharge Plan and Services Additional resources added to the After Visit Summary for     Discharge Planning Services: CM Consult Post Acute Care Choice: Home Health          DME Arranged: N/A DME Agency: NA       HH Arranged: PT HH Agency: [facility] Date [facility] Agency Contacted: 01/31/23 Time HH Agency Contacted: 1510 Representative spoke with at [facility] Agency: Kandee Keen  Social Determinants of Health (SDOH) Interventions SDOH Screenings   Food Insecurity: No Food Insecurity (01/31/2023)  Housing: Patient Unable To Answer (01/31/2023)  Transportation Needs: No Transportation Needs (01/31/2023)  Utilities: Not At Risk (01/31/2023)  Tobacco Use: Medium Risk (01/03/2023)     Readmission Risk Interventions    01/31/2023    3:16 PM 09/07/2022    3:25 PM  Readmission Risk Prevention Plan  Transportation Screening Complete Complete  PCP or Specialist Appt within 5-7 Days  Complete  PCP or Specialist Appt within 3-5 Days Complete   Home Care Screening  Complete  Medication Review (RN CM)  Complete  HRI or Home Care Consult Complete   Palliative Care Screening Not Applicable   Medication Review (RN Care Manager)  Complete

## 2023-01-31 NOTE — Hospital Course (Signed)
Laurie James is a 77 y.o. female with a history of blindness, diastolic heart failure, severe TR, paroxysmal atrial fibrillation, CAD, COPD, CKD stage III, hypertension.  Patient presented secondary to shortness of breath and was found to have a heart failure exacerbation. This was quickly improved with IV lasix.

## 2023-01-31 NOTE — Discharge Instructions (Addendum)
Laurie James,  You were in the hospital because of difficulty breathing. This appears to have been secondary to a mild heart failure exacerbation. This has quickly resolved with Lasix via IV. Please follow-up with your primary care physician.   Information on my medicine - ELIQUIS (apixaban)  This medication education was reviewed with me or my healthcare representative as part of my discharge preparation.   Why was Eliquis prescribed for you? Eliquis was prescribed for you to reduce the risk of a blood clot forming that can cause a stroke if you have a medical condition called atrial fibrillation (a type of irregular heartbeat).  What do You need to know about Eliquis ? Take your Eliquis TWICE DAILY - one tablet in the morning and one tablet in the evening with or without food. If you have difficulty swallowing the tablet whole please discuss with your pharmacist how to take the medication safely.  Take Eliquis exactly as prescribed by your doctor and DO NOT stop taking Eliquis without talking to the doctor who prescribed the medication.  Stopping may increase your risk of developing a stroke.  Refill your prescription before you run out.  After discharge, you should have regular check-up appointments with your healthcare provider that is prescribing your Eliquis.  In the future your dose may need to be changed if your kidney function or weight changes by a significant amount or as you get older.  What do you do if you miss a dose? If you miss a dose, take it as soon as you remember on the same day and resume taking twice daily.  Do not take more than one dose of ELIQUIS at the same time to make up a missed dose.  Important Safety Information A possible side effect of Eliquis is bleeding. You should call your healthcare provider right away if you experience any of the following: Bleeding from an injury or your nose that does not stop. Unusual colored urine (red or  dark brown) or unusual colored stools (red or black). Unusual bruising for unknown reasons. A serious fall or if you hit your head (even if there is no bleeding).  Some medicines may interact with Eliquis and might increase your risk of bleeding or clotting while on Eliquis. To help avoid this, consult your healthcare provider or pharmacist prior to using any new prescription or non-prescription medications, including herbals, vitamins, non-steroidal anti-inflammatory drugs (NSAIDs) and supplements.  This website has more information on Eliquis (apixaban): http://www.eliquis.com/eliquis/home

## 2023-01-31 NOTE — Evaluation (Signed)
Occupational Therapy Evaluation Patient Details Name: Laurie James MRN: [id number] DOB: 02-03-1946 Today's Date: 01/31/2023   History of Present Illness Pt is a 77 y.o. female presenting to [facility] 01/30/23 with increasing SOB w/ orthopnea and LE edema. Admitted with acute exacerbation of CHF and recurrent R pleural effusion Prior admission 9/18 with with moderate right and small left pleural effusion and possible RLL opacity. S/p R thoracentesis. PMH significant for blindness, diastolic CHF, severe TR, PAF on Eliquis, CAD, COPD, CKD-3 and HTN   Clinical Impression   Pt in good spirits, eager to return home. Pt close to or at baseline for ADLs, increased assistance for ambulation with HHA for balance/safety and due to poor vision. Pt's husband assists at home with ADLs/mobility at baseline, daughter/son help with meals. Pt states she has all DME and assistance at home needed, offered [employer] but states she has already received it in the past and has no further OT needs. Will follow acutely to improve with functional activity tolerance and safety as able.        If plan is discharge home, recommend the following: A little help with walking and/or transfers;A little help with bathing/dressing/bathroom;Assistance with cooking/housework;Assist for transportation;Help with stairs or ramp for entrance    Functional Status Assessment  Patient has had a recent decline in their functional status and demonstrates the ability to make significant improvements in function in a reasonable and predictable amount of time.  Equipment Recommendations  None recommended by OT    Recommendations for Other Services       Precautions / Restrictions Precautions Precautions: Fall Restrictions Weight Bearing Restrictions: No      Mobility Bed Mobility Overal bed mobility: Modified Independent             General bed mobility comments: w/ HOB elevated, uses rails    Transfers Overall  transfer level: Needs assistance Equipment used: None Transfers: Sit to/from Stand Sit to Stand: Contact guard assist           General transfer comment: CGA for safety due to low vision      Balance Overall balance assessment: Needs assistance, Mild deficits observed, not formally tested Sitting-balance support: No upper extremity supported, Feet supported Sitting balance-Leahy Scale: Good     Standing balance support: Single extremity supported, During functional activity Standing balance-Leahy Scale: Poor Standing balance comment: requires UE support while ambulating                           ADL either performed or assessed with clinical judgement   ADL Overall ADL's : At baseline;Needs assistance/impaired                                       General ADL Comments: at or close to baseline, min A for LB dressing, set up/supervision     Vision Ability to See in Adequate Light: 4 Severely impaired       Perception         Praxis         Pertinent Vitals/Pain Pain Assessment Pain Assessment: No/denies pain     Extremity/Trunk Assessment Upper Extremity Assessment Upper Extremity Assessment: Overall WFL for tasks assessed   Lower Extremity Assessment Lower Extremity Assessment: Defer to PT evaluation   Cervical / Trunk Assessment Cervical / Trunk Assessment: Kyphotic   Communication Communication Communication: No apparent  difficulties Cueing Techniques: Verbal cues   Cognition Arousal: Alert Behavior During Therapy: WFL for tasks assessed/performed Overall Cognitive Status: Within Functional Limits for tasks assessed                                       General Comments  VSS on RA    Exercises     Shoulder Instructions      Home Living Family/patient expects to be discharged to:: Private residence Living Arrangements: Spouse/significant other Available Help at Discharge: Family;Available 24  hours/day Type of Home: House Home Access: Stairs to enter [employer] of Steps: 3 Entrance Stairs-Rails: None Home Layout: Two level;Able to live on main level with bedroom/bathroom Alternate Level Stairs-Number of Steps: flight Alternate Level Stairs-Rails: Left Bathroom Shower/Tub: Producer, television/film/video: Standard Bathroom Accessibility: Yes   Home Equipment: Agricultural consultant (2 wheels);Cane - single point;Shower seat;Wheelchair - manual;Grab bars - tub/shower;Hospital bed   Additional Comments: lives with her husband, daughter makes meals and brings them over and cleans house and son lives nearby      Prior Functioning/Environment Prior Level of Function : Needs assist       Physical Assist : Mobility (physical);ADLs (physical) Mobility (physical): Gait;Stairs ADLs (physical): Bathing;Dressing Mobility Comments: reports ind, household distances while furniture surfing. Family assist for mobility out of the house due to vision deficits and balance. Pt did not use AD ADLs Comments: Husband assists with shower transfers and occasionally with dressing. Husband cooks/cleans but pt does laundry and dishes        OT Problem List: Decreased activity tolerance;Impaired balance (sitting and/or standing);Impaired vision/perception      OT Treatment/Interventions: Self-care/ADL training;Energy conservation;DME and/or AE instruction;Therapeutic activities;Patient/family education    OT Goals(Current goals can be found in the care plan section) Acute Rehab OT Goals Patient Stated Goal: to return home OT Goal Formulation: With patient Time For Goal Achievement: 02/14/23 Potential to Achieve Goals: Good  OT Frequency: Min 1X/week    Co-evaluation              AM-PAC OT "6 Clicks" Daily Activity     Outcome Measure Help from another person eating meals?: A Little Help from another person taking care of personal grooming?: A Little Help from another person  toileting, which includes using toliet, bedpan, or urinal?: A Little Help from another person bathing (including washing, rinsing, drying)?: A Little Help from another person to put on and taking off regular upper body clothing?: A Little Help from another person to put on and taking off regular lower body clothing?: A Little 6 Click Score: 18   End of Session Equipment Utilized During Treatment: Gait belt Nurse Communication: Mobility status  Activity Tolerance: Patient tolerated treatment well Patient left: in bed;with call bell/phone within reach  OT Visit Diagnosis: Unsteadiness on feet (R26.81);Other abnormalities of gait and mobility (R26.89)                Time: 2440-1027 OT Time Calculation (min): 17 min Charges:  OT General Charges $OT Visit: 1 Visit OT Evaluation $OT Eval Low Complexity: 1 Low  [address] OTR/L   Laurie James 01/31/2023, 1:01 PM

## 2023-01-31 NOTE — ED Notes (Signed)
Pt refused yellow fall risk socks

## 2023-01-31 NOTE — Discharge Summary (Signed)
Physician Discharge Summary   Patient: Laurie James MRN: [id number] DOB: [birth year]  Admit date:     01/30/2023  Discharge date: 01/31/23  Discharge Physician: Jacquelin Hawking, MD   PCP: Georgann Housekeeper, MD   Recommendations at discharge:  PCP follow-up Repeat CMP  Discharge Diagnoses: Principal Problem:   Acute exacerbation of CHF (congestive heart failure) (HCC) Active Problems:   Recurrent right pleural effusion   Paroxysmal atrial fibrillation (HCC)   Chronic kidney disease, stage 3a (HCC)   HLD (hyperlipidemia)   Elevated LFTs   Hypothyroidism  Resolved Problems:   * No resolved hospital problems. [facility] Course: Laurie James is a 77 y.o. female with a history of blindness, diastolic heart failure, severe TR, paroxysmal atrial fibrillation, CAD, COPD, CKD stage III, hypertension.  Patient presented secondary to shortness of breath and was found to have a heart failure exacerbation. This was quickly improved with IV lasix.  Assessment and Plan:  Acute on chronic diastolic heart failure Chest x-ray significant for pulmonary vascular congestion. BNP elevated at 814. Patient started on Lasix IV diuresis. Weight increased from admission, however clinically patient has significantly improved; weights are likely inaccurate. Patient transitioned back to home Lasix regimen. Discussed proper dietary considerations to minimize recurrent exacerbations.   Recurrent right pleural effusion Small effusions seen on chest x-ray. Likely related to acute heart failure. -Manage with diuresis   Paroxysmal atrial fibrillation Continue amiodarone and Eliquis   CKD stage IIIa Baseline creatinine around 1.1-1.3. Stable.   Elevated LFTs Mild. Possibly related to volume overload. Normal bilirubin. No associated abdominal pain. Repeat CMP as an outpatient.   Hyperlipidemia Hold Lipitor until repeat lab work   Hypothyroidism TSH of 6.653, which is decreased  from 7.458 one month prior. Continue Synthroid 75 mcg daily and repeat TSH in another 4-6 weeks.   GERD Continue protonix   Consultants: None Procedures performed: None  Disposition: Home health Diet recommendation: Low sodium   DISCHARGE MEDICATION: Allergies as of 01/31/2023       Reactions   Codeine Nausea And Vomiting, Other (See Comments)   Upsets the stomach        Medication List     TAKE these medications    albuterol 108 (90 Base) MCG/ACT inhaler Commonly known as: VENTOLIN HFA Inhale 2 puffs into the lungs every 4 (four) hours as needed for wheezing.   amiodarone 200 MG tablet Commonly known as: PACERONE Take 1 tablet (200 mg total) by mouth 2 (two) times daily for 30 days, THEN 1 tablet (200 mg total) daily. Start taking on: January 10, 2023   atorvastatin 40 MG tablet Commonly known as: LIPITOR Stop taking until you see your PCP and obtain repeat labwork. What changed:  how much to take how to take this when to take this additional instructions   budesonide-formoterol 80-4.5 MCG/ACT inhaler Commonly known as: Symbicort Inhale 2 puffs into the lungs in the morning and at bedtime.   cyanocobalamin 1000 MCG/ML injection Commonly known as: VITAMIN B12 Inject 1,000 mcg into the muscle every 30 (thirty) days.   Eliquis 2.5 MG Tabs tablet Generic drug: apixaban Take 1 tablet by mouth twice daily   furosemide 20 MG tablet Commonly known as: LASIX Take 1 tablet (20 mg total) by mouth every Monday, Wednesday, and Friday. Leg edema   mupirocin ointment 2 % Commonly known as: BACTROBAN Apply 1 Application topically daily.   nitroGLYCERIN 0.4 MG SL tablet Commonly known as: NITROSTAT Place 1 tablet (0.4  mg total) under the tongue every 5 (five) minutes x 3 doses as needed for chest pain.   ondansetron 4 MG tablet Commonly known as: ZOFRAN Take 4 mg by mouth 3 (three) times daily as needed for nausea or vomiting.   pantoprazole 40 MG  tablet Commonly known as: PROTONIX Take 1 tablet (40 mg total) by mouth 2 (two) times daily.   potassium chloride 10 MEQ tablet Commonly known as: KLOR-CON Take 1 tablet (10 mEq total) by mouth every Monday, Wednesday, and Friday.   Synthroid 75 MCG tablet Generic drug: levothyroxine Take 75 mcg by mouth daily before breakfast.        Follow-up Information     [facility] Follow up.   Specialty: Home Health Services Why: Agency will call you to det up apt time Contact information: [address] [place] [address] [phone]         Georgann Housekeeper, MD. Schedule an appointment as soon as possible for a visit in 1 week(s).   Specialty: Internal Medicine Why: For hospital follow-up Contact information: [address] [place] [address] [phone]                Discharge Exam: BP 107/60 (BP Location: Left Arm)   Pulse (!) 51   Temp 97.9 F (36.6 C) (Oral)   Resp 16   Ht 4\' 8"  (1.422 m)   Wt 48.8 kg   SpO2 99%   BMI 24.12 kg/m   General exam: Appears calm and comfortable Respiratory system: Clear to auscultation with diminished breath sounds in right base. Respiratory effort normal. Cardiovascular system: S1 & S2 heard, slow rate with regular rhythm. No murmurs. Gastrointestinal system: Abdomen is nondistended, soft and nontender. Normal bowel sounds heard. Central nervous system: Alert and oriented. No focal neurological deficits. Musculoskeletal: No edema. No calf tenderness Skin: No cyanosis. No rashes Psychiatry: Judgement and insight appear normal. Mood & affect appropriate.   Condition at discharge: stable  The results of significant diagnostics from this hospitalization (including imaging, microbiology, ancillary and laboratory) are listed below for reference.   Imaging Studies: DG Chest 1 View  Result Date: 01/30/2023 CLINICAL DATA:  Shortness of breath EXAM: CHEST  1 VIEW COMPARISON:  Chest  x-ray 12/27/2022 FINDINGS: There are small bilateral pleural effusions. There is cardiomegaly and central pulmonary vascular congestion. No evidence for pneumothorax or acute fracture. IMPRESSION: 1. Cardiomegaly and central pulmonary vascular congestion. 2. Small bilateral pleural effusions. Electronically Signed   By: Darliss Cheney M.D.   On: 01/30/2023 20:18    Microbiology: Results for orders placed or performed during the hospital encounter of 01/30/23  Resp panel by RT-PCR (RSV, Flu A&B, Covid) Anterior Nasal Swab     Status: None   Collection Time: 01/30/23  5:43 PM   Specimen: Anterior Nasal Swab  Result Value Ref Range Status   SARS Coronavirus 2 by RT PCR NEGATIVE NEGATIVE Final   Influenza A by PCR NEGATIVE NEGATIVE Final   Influenza B by PCR NEGATIVE NEGATIVE Final    Comment: (NOTE) The Xpert Xpress SARS-CoV-2/FLU/RSV plus assay is intended as an aid in the diagnosis of influenza from Nasopharyngeal swab specimens and should not be used as a sole basis for treatment. Nasal washings and aspirates are unacceptable for Xpert Xpress SARS-CoV-2/FLU/RSV testing.  Fact Sheet for Patients: BloggerCourse.com  Fact Sheet for Healthcare Providers: SeriousBroker.it  This test is not yet approved or cleared by the [place] FDA and has  been authorized for detection and/or diagnosis of SARS-CoV-2 by FDA under an Emergency Use Authorization (EUA). This EUA will remain in effect (meaning this test can be used) for the duration of the COVID-19 declaration under Section 564(b)(1) of the Act, 21 U.S.C. section 360bbb-3(b)(1), unless the authorization is terminated or revoked.     Resp Syncytial Virus by PCR NEGATIVE NEGATIVE Final    Comment: (NOTE) Fact Sheet for Patients: BloggerCourse.com  Fact Sheet for Healthcare Providers: SeriousBroker.it  This test is not yet approved or  cleared by the [place] FDA and has been authorized for detection and/or diagnosis of SARS-CoV-2 by FDA under an Emergency Use Authorization (EUA). This EUA will remain in effect (meaning this test can be used) for the duration of the COVID-19 declaration under Section 564(b)(1) of the Act, 21 U.S.C. section 360bbb-3(b)(1), unless the authorization is terminated or revoked.  Performed at [facility], [address], [place], [place] [address]     Labs: CBC: Recent Labs  Lab 01/30/23 1743 01/31/23 0749  WBC 8.7 7.9  HGB 8.4* 8.2*  HCT 28.4* 26.8*  MCV 88.2 83.5  PLT 372 345   Basic Metabolic Panel: Recent Labs  Lab 01/30/23 1743 01/31/23 0749  NA 135 136  K 4.2 3.7  CL 108 103  CO2 20* 22  GLUCOSE 98 100*  BUN 24* 23  CREATININE 1.15* 1.26*  CALCIUM 8.4* 8.3*  MG  --  1.9   Liver Function Tests: Recent Labs  Lab 01/30/23 1743  AST 95*  ALT 84*  ALKPHOS 79  BILITOT 1.1  PROT 7.3  ALBUMIN 2.8*    Discharge time spent: 35 minutes.  Signed: Jacquelin Hawking, MD [facility] 01/31/2023

## 2023-01-31 NOTE — TOC Initial Note (Signed)
Transition of Care [facility]) - Initial/Assessment Note    Patient Details  Name: Laurie James MRN: [id number] Date of Birth: 10-09-1945  Transition [facility]) CM/SW Contact:    Leone Haven, RN Phone Number: 01/31/2023, 3:11 PM  Clinical Narrative:                 From home with spouse, has PCP and insurance on file, states has no HH services in place at this time  per pt eval rec HHPT, NCM offered choice, they are ok with [facility], NCM made referral to [facility] with [facility], he is able to take referral.   Soc will begin 24 to 48 hrs post dc.  She has hospital bed, walker, w/chair.  States family member , Tresa Endo, at the bedside  will transport her home at [employer] and family is support system, states gets medications from [facility] in [place].  Pta self ambulatory walker.    Expected Discharge Plan: Home w Home Health Services Barriers to Discharge: No Barriers Identified   Patient Goals and CMS Choice Patient states their goals for this hospitalization and ongoing recovery are:: return home CMS Medicare.gov Compare Post Acute Care list provided to:: Patient Represenative (must comment) Choice offered to / list presented to : Adult Children      Expected Discharge Plan and Services   Discharge Planning Services: CM Consult Post Acute Care Choice: Home Health Living arrangements for the past 2 months: Single Family Home                 DME Arranged: N/A DME Agency: NA       HH Arranged: PT HH Agency: [facility] Date [facility] Agency Contacted: 01/31/23 Time HH Agency Contacted: 1510 Representative spoke with at [facility] Agency: Kandee Keen  Prior Living Arrangements/Services Living arrangements for the past 2 months: Single Family Home Lives with:: Spouse Patient language and need for interpreter reviewed:: Yes Do you feel safe going back to the place where you live?: Yes      Need for Family Participation in Patient Care: Yes (Comment) Care giver support system in  place?: Yes (comment) Current home services: DME (hospital bed, walker, w/chair) Criminal Activity/Legal Involvement Pertinent to Current Situation/Hospitalization: No - Comment as needed  Activities of Daily Living   ADL Screening (condition at time of admission) Independently performs ADLs?: Yes (appropriate for developmental age) Is the patient deaf or have difficulty hearing?: No Does the patient have difficulty seeing, even when wearing glasses/contacts?: Yes Does the patient have difficulty concentrating, remembering, or making decisions?: No  Permission Sought/Granted Permission sought to share information with : Case Manager Permission granted to share information with : Yes, Verbal Permission Granted     Permission granted to share info w AGENCY: [facility]        Emotional Assessment Appearance:: Appears stated age Attitude/Demeanor/Rapport: Engaged Affect (typically observed): Appropriate Orientation: : Oriented to Self, Oriented to Place, Oriented to  Time, Oriented to Situation Alcohol / Substance Use: Not Applicable Psych Involvement: No (comment)  Admission diagnosis:  Pleural effusion [J90] Acute exacerbation of CHF (congestive heart failure) (HCC) [I50.9] Patient Active Problem List   Diagnosis Date Noted   Acute exacerbation of CHF (congestive heart failure) (HCC) 01/30/2023   Hypothyroidism 01/30/2023   Community acquired pneumonia 12/27/2022   Acute CHF (congestive heart failure) (HCC) 10/27/2022   Chronic kidney disease, stage 3a (HCC) 09/08/2022   Heart failure (HCC) 09/06/2022   Chronic diastolic CHF (congestive heart failure) (HCC) 09/05/2022  GERD without esophagitis 05/02/2022   Gout 05/02/2022   Left lower quadrant abdominal pain 05/02/2022   Elevated LFTs 05/02/2022   Hyponatremia 05/02/2022   Recurrent right pleural effusion 05/01/2022   Paroxysmal atrial fibrillation (HCC) 05/01/2022   Secondary hypercoagulable state (HCC) 04/04/2022   Chronic  systolic CHF (congestive heart failure) (HCC) 11/28/2021   Macular degeneration 11/28/2021   MCI (mild cognitive impairment) 11/28/2021   OSA (obstructive sleep apnea)    Protein-calorie malnutrition, severe 10/22/2020   Depression    Bilateral pleural effusion 10/19/2020   Pulmonary HTN (HCC) 10/11/2020   Tricuspid regurgitation 10/11/2020   HLD (hyperlipidemia) 10/11/2020   Hypotension 10/11/2020   Hypokalemia 10/11/2020   Chest pain at rest 10/05/2020   CAD (coronary artery disease) 04/24/2014   COPD with acute exacerbation (HCC) 04/24/2014   PCP:  Georgann Housekeeper, MD Pharmacy:   [facility] [address], [place] - [address] [facility] [place] [address] Phone: [phone] Fax: [phone]     Social Determinants of Health (SDOH) Social History: SDOH Screenings   Food Insecurity: No Food Insecurity (01/31/2023)  Housing: Patient Unable To Answer (01/31/2023)  Transportation Needs: No Transportation Needs (01/31/2023)  Utilities: Not At Risk (01/31/2023)  Tobacco Use: Medium Risk (01/03/2023)   SDOH Interventions:     Readmission Risk Interventions    09/07/2022    3:25 PM  Readmission Risk Prevention Plan  Transportation Screening Complete  PCP or Specialist Appt within 5-7 Days Complete  Home Care Screening Complete  Medication Review (RN CM) Complete

## 2023-01-31 NOTE — Care Management Obs Status (Signed)
MEDICARE OBSERVATION STATUS NOTIFICATION   Patient Details  Name: Laurie James MRN: [id number] Date of Birth: 07-16-1945   Medicare Observation Status Notification Given:  Yes    Leone Haven, RN 01/31/2023, 4:03 PM

## 2023-01-31 NOTE — Care Management CC44 (Signed)
Condition Code 44 Documentation Completed  Patient Details  Name: Laurie James MRN: [id number] Date of Birth: October 04, 1945   Condition Code 44 given:  Yes Patient signature on Condition Code 44 notice:  Yes Documentation of 2 MD's agreement:  Yes Code 44 added to claim:  Yes    Leone Haven, RN 01/31/2023, 4:03 PM

## 2023-01-31 NOTE — ED Provider Notes (Signed)
[place] EMERGENCY DEPARTMENT AT [facility] Provider Note   CSN: [id number] Arrival date & time: 01/31/23  1922     History  Chief Complaint  Patient presents with   Laurie Mess Icy James is a 77 y.o. female with a past medical history of CHF, CAD, stage III kidney disease, COPD, macular degeneration, pulmonary hypertension, paroxysmal A-fib on Eliquis who presents to the emergency department after mechanical fall for right hip pain.  Patient states she was getting out of the car and lost her footing and fell onto her hip.  She did not hit her hair head or lose consciousness.  She had an obvious deformity and was brought straight into the emergency department.   Fall       Home Medications Prior to Admission medications   Medication Sig Start Date End Date Taking? Authorizing Provider  albuterol (VENTOLIN HFA) 108 (90 Base) MCG/ACT inhaler Inhale 2 puffs into the lungs every 4 (four) hours as needed for wheezing. 06/08/20  Yes [provider]  amiodarone (PACERONE) 200 MG tablet Take 1 tablet (200 mg total) by mouth 2 (two) times daily for 30 days, THEN 1 tablet (200 mg total) daily. 01/10/23 02/09/24 Yes Eustace Pen, PA-C  apixaban Everlene Balls) 2.5 MG TABS tablet Take 1 tablet by mouth twice daily 01/30/23  Yes Bensimhon, Bevelyn Buckles, MD  atorvastatin (LIPITOR) 40 MG tablet Stop taking until you see your PCP and obtain repeat labwork. Patient taking differently: Take 40 mg by mouth daily. Stop taking until you see your PCP and obtain repeat labwork. 01/31/23  Yes Narda Bonds, MD  cyanocobalamin (,VITAMIN B-12,) 1000 MCG/ML injection Inject 1,000 mcg into the muscle every 30 (thirty) days. 06/11/16  Yes [provider]  furosemide (LASIX) 20 MG tablet Take 1 tablet (20 mg total) by mouth every Monday, Wednesday, and Friday. Leg edema 01/12/23  Yes [place], [place], FNP  mupirocin ointment (BACTROBAN) 2 % Apply 1 Application topically  daily. 01/15/23  Yes [provider]  nitroGLYCERIN (NITROSTAT) 0.4 MG SL tablet Place 1 tablet (0.4 mg total) under the tongue every 5 (five) minutes x 3 doses as needed for chest pain. 10/11/20  Yes Georgie Chard D, NP  ondansetron (ZOFRAN) 4 MG tablet Take 4 mg by mouth 3 (three) times daily as needed for nausea or vomiting. 11/22/20  Yes [provider]  pantoprazole (PROTONIX) 40 MG tablet Take 1 tablet (40 mg total) by mouth 2 (two) times daily. 09/05/22  Yes Bensimhon, Bevelyn Buckles, MD  potassium chloride (KLOR-CON) 10 MEQ tablet Take 1 tablet (10 mEq total) by mouth every Monday, Wednesday, and Friday. 01/12/23  Yes [place], FNP  SYNTHROID 75 MCG tablet Take 75 mcg by mouth daily before breakfast. 10/18/22  Yes [provider]  budesonide-formoterol (SYMBICORT) 80-4.5 MCG/ACT inhaler Inhale 2 puffs into the lungs in the morning and at bedtime. 10/30/22   Briant Cedar, MD      Allergies    Codeine    Review of Systems   Review of Systems  Physical Exam Updated Vital Signs BP 106/65   Pulse (!) 57   Temp 98.2 F (36.8 C) (Oral)   Resp 19   Ht 4\' 8"  (1.422 m)   Wt 52.2 kg   SpO2 96%   BMI 25.80 kg/m  Physical Exam Vitals and nursing note reviewed.  Constitutional:      General: She is not in acute distress.  Appearance: She is well-developed. She is not diaphoretic.  HENT:     Head: Normocephalic and atraumatic.     Right Ear: External ear normal.     Left Ear: External ear normal.     Nose: Nose normal.     Mouth/Throat:     Mouth: Mucous membranes are moist.  Eyes:     General: No scleral icterus.    Conjunctiva/sclera: Conjunctivae normal.  Cardiovascular:     Rate and Rhythm: Normal rate and regular rhythm.     Heart sounds: Normal heart sounds. No murmur heard.    No friction rub. No gallop.  Pulmonary:     Effort: Pulmonary effort is normal. No respiratory distress.     Breath sounds: Normal breath sounds.  Abdominal:      General: Bowel sounds are normal. There is no distension.     Palpations: Abdomen is soft. There is no mass.     Tenderness: There is no abdominal tenderness. There is no guarding.  Musculoskeletal:     Cervical back: Normal range of motion.     Comments: Right leg is shortened and laterally rotated.  Nerve neurovascularly intact.  Skin:    General: Skin is warm and dry.  Neurological:     Mental Status: She is alert and oriented to person, place, and time.  Psychiatric:        Behavior: Behavior normal.     ED Results / Procedures / Treatments   Labs (all labs ordered are listed, but only abnormal results are displayed) Labs Reviewed  BASIC METABOLIC PANEL - Abnormal; Notable for the following components:      Result Value   Glucose, Bld 120 (*)    BUN 27 (*)    Creatinine, Ser 1.39 (*)    Calcium 8.6 (*)    GFR, Estimated 39 (*)    All other components within normal limits  CBC WITH DIFFERENTIAL/PLATELET - Abnormal; Notable for the following components:   WBC 12.7 (*)    RBC 3.14 (*)    Hemoglobin 8.1 (*)    HCT 26.2 (*)    MCH 25.8 (*)    RDW 20.3 (*)    Neutro Abs 11.6 (*)    Lymphs Abs 0.4 (*)    Monocytes Absolute 0.0 (*)    Eosinophils Absolute 0.8 (*)    All other components within normal limits  PROTIME-INR - Abnormal; Notable for the following components:   Prothrombin Time 21.3 (*)    INR 1.8 (*)    All other components within normal limits  BRAIN NATRIURETIC PEPTIDE - Abnormal; Notable for the following components:   B Natriuretic Peptide 327.6 (*)    All other components within normal limits  CBC - Abnormal; Notable for the following components:   WBC 10.9 (*)    RBC 2.83 (*)    Hemoglobin 7.2 (*)    HCT 23.6 (*)    MCH 25.4 (*)    RDW 19.8 (*)    All other components within normal limits  COMPREHENSIVE METABOLIC PANEL - Abnormal; Notable for the following components:   CO2 20 (*)    Glucose, Bld 102 (*)    BUN 32 (*)    Creatinine, Ser 1.54  (*)    Calcium 8.3 (*)    Albumin 2.6 (*)    AST 83 (*)    ALT 80 (*)    GFR, Estimated 35 (*)    All other components within normal limits  CBC - Abnormal;  Notable for the following components:   WBC 14.3 (*)    RBC 2.08 (*)    Hemoglobin 5.3 (*)    HCT 17.2 (*)    MCH 25.5 (*)    RDW 20.0 (*)    All other components within normal limits  TROPONIN I (HIGH SENSITIVITY) - Abnormal; Notable for the following components:   Troponin I (High Sensitivity) 25 (*)    All other components within normal limits  SURGICAL PCR SCREEN  BASIC METABOLIC PANEL  MAGNESIUM  CBC  TYPE AND SCREEN  ABO/RH  PREPARE RBC (CROSSMATCH)    EKG None  Radiology DG FEMUR, MIN 2 VIEWS RIGHT  Result Date: 02/01/2023 CLINICAL DATA:  Known right intratrochanteric femoral fracture EXAM: RIGHT FEMUR 2 VIEWS COMPARISON:  Films from the previous day. FLUOROSCOPY TIME:  Radiation Exposure Index (as provided by the fluoroscopic device): 14.13 mGy If the device does not provide the exposure index: Fluoroscopy Time:  83 seconds Number of Acquired Images:  4 FINDINGS: Proximal medullary rod with fixation screw traversing the femoral neck is seen. Distal fixation screw is noted as well. Fracture fragments are in near anatomic alignment. IMPRESSION: ORIF of right femoral fracture. Electronically Signed   By: Alcide Clever M.D.   On: 02/01/2023 17:07   DG C-Arm 1-60 Min-No Report  Result Date: 02/01/2023 Fluoroscopy was utilized by the requesting physician.  No radiographic interpretation.   DG Hip Unilat With Pelvis 2-3 Views Right  Result Date: 01/31/2023 CLINICAL DATA:  Fall EXAM: DG HIP (WITH OR WITHOUT PELVIS) 2-3V RIGHT COMPARISON:  None Available. FINDINGS: There is an acute comminuted right femoral intratrochanteric fracture. There is 1 shaft with superolateral displacement of the distal fracture fragment. There is no dislocation. IMPRESSION: Acute comminuted right femoral intratrochanteric fracture.  Electronically Signed   By: Darliss Cheney M.D.   On: 01/31/2023 21:42   DG Chest Port 1 View  Result Date: 01/31/2023 CLINICAL DATA:  Fall EXAM: PORTABLE CHEST 1 VIEW COMPARISON:  Chest x-ray 01/30/2023 FINDINGS: Moderate right effusion is stable. Small left pleural effusion has decreased from prior. The heart is enlarged. There some interstitial opacities in the lower lungs, unchanged. Biapical pleural-parenchymal scarring again noted. No pneumothorax or acute fracture. Osseous structures are stable. IMPRESSION: 1. Moderate right pleural effusion is stable. Small left pleural effusion has decreased from prior. 2. Cardiomegaly with interstitial opacities in the lower lungs, unchanged. Electronically Signed   By: Darliss Cheney M.D.   On: 01/31/2023 21:41    Procedures Procedures    Medications Ordered in ED Medications  naloxone [facility]) injection 0.4 mg ( Intravenous MAR Unhold 02/01/23 1939)  HYDROmorphone (DILAUDID) injection 0.5 mg (0.5 mg Intravenous Given 02/01/23 2122)  oxyCODONE (Oxy IR/ROXICODONE) immediate release tablet 5 mg ( Oral MAR Unhold 02/01/23 1939)  acetaminophen (TYLENOL) tablet 650 mg ( Oral MAR Unhold 02/01/23 1939)  polyethylene glycol (MIRALAX / GLYCOLAX) packet 17 g (17 g Oral Given 02/02/23 0805)  senna-docusate (Senokot-S) tablet 1 tablet (1 tablet Oral Given 02/02/23 0803)  amiodarone (PACERONE) tablet 200 mg (200 mg Oral Given 02/02/23 0803)    Followed by  amiodarone (PACERONE) tablet 200 mg ( Oral MAR Unhold 02/01/23 1939)  albuterol (PROVENTIL) (2.5 MG/3ML) 0.083% nebulizer solution 2.5 mg ( Inhalation MAR Unhold 02/01/23 1939)  atorvastatin (LIPITOR) tablet 40 mg (40 mg Oral Given 02/02/23 0803)  mometasone-formoterol (DULERA) 100-5 MCG/ACT inhaler 2 puff (2 puffs Inhalation Not Given 02/02/23 0750)  pantoprazole (PROTONIX) EC tablet 40 mg (40 mg Oral Given 02/02/23  4098)  levothyroxine (SYNTHROID) tablet 75 mcg (75 mcg Oral Given 02/02/23 0803)   amisulpride (BARHEMSYS) 5 MG/2ML injection (has no administration in time range)  furosemide (LASIX) injection 40 mg (has no administration in time range)  fentaNYL (SUBLIMAZE) injection 50 mcg (50 mcg Intravenous Given 02/01/23 0014)  ondansetron (ZOFRAN) injection 4 mg (4 mg Intravenous Given 01/31/23 2035)  povidone-iodine 10 % swab 2 Application (2 Applications Topical Given 02/01/23 1057)  ceFAZolin (ANCEF) IVPB 2g/100 mL premix (2 g Intravenous Given 02/01/23 1202)  ondansetron (ZOFRAN-ODT) disintegrating tablet 4 mg (4 mg Oral Given 01/31/23 2344)  promethazine (PHENERGAN) 12.5 mg in sodium chloride 0.9 % 50 mL IVPB (0 mg Intravenous Stopped 02/01/23 0136)  fentaNYL (SUBLIMAZE) injection 25 mcg (25 mcg Intravenous Given 02/01/23 0439)  chlorhexidine (PERIDEX) 0.12 % solution 15 mL (15 mLs Mouth/Throat Given 02/01/23 1056)    Or  Oral care mouth rinse ( Mouth Rinse See Alternative 02/01/23 1056)  ceFAZolin (ANCEF) IVPB 1 g/50 mL premix (1 g Intravenous New Bag/Given 02/02/23 0835)  amisulpride (BARHEMSYS) injection 10 mg (10 mg Intravenous Given 02/01/23 1401)  0.9 %  sodium chloride infusion (Manually program via Guardrails IV Fluids) (0 mLs Intravenous Stopped 02/02/23 0803)    ED Course/ Medical Decision Making/ A&P Clinical Course as of 02/02/23 1191  Wed Jan 31, 2023  2028 Basic metabolic panel(!) [AH]  2028 DG Hip Unilat With Pelvis 2-3 Views Right I visualized and interpreted 2-3 view pelvic and hip x-ray as well as portable 1 view chest x-ray.  Patient has an right intertrochanteric fracture.  I was able to discuss the findings with Dr. Susa Simmonds via phone call as he was already on the telephone with Dr. Stephanie Coup.  He is aware of the patient.  She will likely need medical clearance. [AH]  2128 Protime-INR(!) [AH]  2128 CBC with Differential(!) [AH]    Clinical Course User Index [AH] Arthor Captain, PA-C                                 Medical Decision Making 77 year old  female with multiple comorbidities on Eliquis who presents the emergency doctor mechanical fall. I have visualized and interpreted plain film of the right hip which shows intertrochanteric fracture.  Neurovascularly intact.  I visualized and interpreted chest x-ray which shows chronic pleural effusion unchanged. I reviewed patient's labs.   BMP shows baseline BMP shows a slight renal insufficiency PT/INR is mildly elevated CBC shows mildly elevated white blood cell count likely acute phase reaction  Patient will need admission for right intratrochanteric hip fracture.   Amount and/or Complexity of Data Reviewed Labs: ordered. Decision-making details documented in ED Course. Radiology: ordered. Decision-making details documented in ED Course. ECG/medicine tests: ordered.  Risk Prescription drug management. Decision regarding hospitalization.           Final Clinical Impression(s) / ED Diagnoses Final diagnoses:  Closed fracture of right hip, initial encounter [facility])    Rx / DC Orders ED Discharge Orders     None         Arthor Captain, PA-C 02/02/23 4782    Tegeler, Canary Brim, MD 02/07/23 [phone]

## 2023-01-31 NOTE — Progress Notes (Incomplete)
PROGRESS NOTE    Laurie James  WUJ:[id number] DOB: 11/03/1945 DOA: 01/30/2023 PCP: Georgann Housekeeper, MD   Brief Narrative: No notes on file   Assessment and Plan:  Acute on chronic diastolic heart failure ***Chest x-ray significant for pulmonary vascular congestion. BNP elevated at ***. ***. Weight on admission of ***. Dry weight ***. Weight of *** today. UOP of ***. Patient started on Lasix IV diuresis. -Continue Lasix IV with potassium supplementation*** -Daily BMP  Recurrent right pleural effusion Small effusions seen on chest x-ray. Likely related to acute heart failure. -Manage with diuresis  Paroxysmal atrial fibrillation *** -Continue amiodarone and Eliquis  CKD stage IIIa Baseline creatinine around 1.1-1.3. Stable.  Elevated LFTs Mild. Possibly related to volume overload. Normal bilirubin. Patient is on ***.  Hyperlipidemia -Continue Lipitor  Hypothyroidism -Continue Synthroid 75 mcg daily  GERD -Continue protonix   DVT prophylaxis: Eliquis Code Status:   Code Status: Prior Family Communication: *** Disposition Plan: ***   Consultants:  None  Procedures:  ***  Antimicrobials: None    Subjective: ***  Objective: BP (!) 141/65 (BP Location: Right Arm)   Pulse (!) 52   Temp 97.8 F (36.6 C) (Oral)   Resp (!) 23   Ht 4\' 8"  (1.422 m)   Wt 48.8 kg   SpO2 98%   BMI 24.12 kg/m   Examination:  General exam: Appears calm and comfortable Respiratory system: Clear to auscultation with diminished breath sounds in right base. Respiratory effort normal. Cardiovascular system: S1 & S2 heard, slow rate with regular rhythm. No murmurs. Gastrointestinal system: Abdomen is nondistended, soft and nontender. Normal bowel sounds heard. Central nervous system: Alert and oriented. No focal neurological deficits. Musculoskeletal: No edema. No calf tenderness Skin: No cyanosis. No rashes Psychiatry: Judgement and insight appear normal.  Mood & affect appropriate.    Data Reviewed: I have personally reviewed following labs and imaging studies  CBC Lab Results  Component Value Date   WBC 8.7 01/30/2023   RBC 3.22 (L) 01/30/2023   HGB 8.4 (L) 01/30/2023   HCT 28.4 (L) 01/30/2023   MCV 88.2 01/30/2023   MCH 26.1 01/30/2023   PLT 372 01/30/2023   MCHC 29.6 (L) 01/30/2023   RDW 20.1 (H) 01/30/2023   LYMPHSABS 0.7 12/27/2022   MONOABS 0.5 12/27/2022   EOSABS 0.9 (H) 12/27/2022   BASOSABS 0.0 12/27/2022     Last metabolic panel Lab Results  Component Value Date   NA 135 01/30/2023   K 4.2 01/30/2023   CL 108 01/30/2023   CO2 20 (L) 01/30/2023   BUN 24 (H) 01/30/2023   CREATININE 1.15 (H) 01/30/2023   GLUCOSE 98 01/30/2023   GFRNONAA 49 (L) 01/30/2023   GFRAA 82 (L) 12/21/2011   CALCIUM 8.4 (L) 01/30/2023   PHOS 3.8 10/20/2020   PROT 7.3 01/30/2023   ALBUMIN 2.8 (L) 01/30/2023   BILITOT 1.1 01/30/2023   ALKPHOS 79 01/30/2023   AST 95 (H) 01/30/2023   ALT 84 (H) 01/30/2023   ANIONGAP 7 01/30/2023    GFR: Estimated Creatinine Clearance: 26.7 mL/min (A) (by C-G formula based on SCr of 1.15 mg/dL (H)).  Recent Results (from the past 240 hour(s))  Resp panel by RT-PCR (RSV, Flu A&B, Covid) Anterior Nasal Swab     Status: None   Collection Time: 01/30/23  5:43 PM   Specimen: Anterior Nasal Swab  Result Value Ref Range Status   SARS Coronavirus 2 by RT PCR NEGATIVE NEGATIVE Final   Influenza A  by PCR NEGATIVE NEGATIVE Final   Influenza B by PCR NEGATIVE NEGATIVE Final    Comment: (NOTE) The Xpert Xpress SARS-CoV-2/FLU/RSV plus assay is intended as an aid in the diagnosis of influenza from Nasopharyngeal swab specimens and should not be used as a sole basis for treatment. Nasal washings and aspirates are unacceptable for Xpert Xpress SARS-CoV-2/FLU/RSV testing.  Fact Sheet for Patients: BloggerCourse.com  Fact Sheet for Healthcare  Providers: SeriousBroker.it  This test is not yet approved or cleared by the [place] FDA and has been authorized for detection and/or diagnosis of SARS-CoV-2 by FDA under an Emergency Use Authorization (EUA). This EUA will remain in effect (meaning this test can be used) for the duration of the COVID-19 declaration under Section 564(b)(1) of the Act, 21 U.S.C. section 360bbb-3(b)(1), unless the authorization is terminated or revoked.     Resp Syncytial Virus by PCR NEGATIVE NEGATIVE Final    Comment: (NOTE) Fact Sheet for Patients: BloggerCourse.com  Fact Sheet for Healthcare Providers: SeriousBroker.it  This test is not yet approved or cleared by the [place] FDA and has been authorized for detection and/or diagnosis of SARS-CoV-2 by FDA under an Emergency Use Authorization (EUA). This EUA will remain in effect (meaning this test can be used) for the duration of the COVID-19 declaration under Section 564(b)(1) of the Act, 21 U.S.C. section 360bbb-3(b)(1), unless the authorization is terminated or revoked.  Performed at [facility], [address], [place], [place] [address]       Radiology Studies: DG Chest 1 View  Result Date: 01/30/2023 CLINICAL DATA:  Shortness of breath EXAM: CHEST  1 VIEW COMPARISON:  Chest x-ray 12/27/2022 FINDINGS: There are small bilateral pleural effusions. There is cardiomegaly and central pulmonary vascular congestion. No evidence for pneumothorax or acute fracture. IMPRESSION: 1. Cardiomegaly and central pulmonary vascular congestion. 2. Small bilateral pleural effusions. Electronically Signed   By: Darliss Cheney M.D.   On: 01/30/2023 20:18      LOS: 1 day    Jacquelin Hawking, MD [facility] 01/31/2023, 7:26 AM   If 7PM-7AM, please contact night-coverage [email or web address]

## 2023-01-31 NOTE — Progress Notes (Signed)
Heart Failure Navigator Progress Note  Assessed for Heart & Vascular TOC clinic readiness.  Patient does not meet criteria due to Advanced Heart Failure Team patient.   Navigator will sign off at this time.   Roxy Horseman, RN, BSN [facility] Heart Failure Navigator Secure Chat Only

## 2023-01-31 NOTE — ED Triage Notes (Signed)
Pt BIB [facility] from home. Pt was d/c today from this facility, upon arriving home pt was getting out of the car and lost her balance causing her to fall; landing on the R hip. Pt does have shortening to R leg and +1 pedal pulse. Pt denies dizziness and denies LOC and hitting her head. Pt does take eliquis. Multiple skin tears on hand and left leg.

## 2023-01-31 NOTE — Progress Notes (Signed)
SATURATION QUALIFICATIONS: (This note is used to comply with regulatory documentation for home oxygen)  Patient Saturations on Room Air at Rest =100 Patient Saturations on Room Air while Ambulating = *94 Patient Saturations on0Liters of oxygen while Ambulating = 94  Please briefly explain why patient needs home oxygen: 

## 2023-01-31 NOTE — Consult Note (Signed)
Brief orthopedic consult note:  Called by EDP for this patient.  She had a fall and x-rays revealed a displaced comminuted intertrochanteric hip fracture on the right side.  Patient has multiple medical comorbidities and the hospitalist service has been consulted.  Patient would benefit from operative treatment of her hip fracture.  Will plan for surgery once medically stable.  Will keep n.p.o. past midnight in case surgery can be performed tomorrow.  Bedrest for now.  Hold anticoagulation.

## 2023-02-01 ENCOUNTER — Other Ambulatory Visit: Payer: Self-pay

## 2023-02-01 ENCOUNTER — Inpatient Hospital Stay (HOSPITAL_COMMUNITY): Payer: Medicare PPO | Admitting: Anesthesiology

## 2023-02-01 ENCOUNTER — Inpatient Hospital Stay (HOSPITAL_COMMUNITY): Payer: Medicare PPO

## 2023-02-01 ENCOUNTER — Encounter (HOSPITAL_COMMUNITY): Payer: Self-pay | Admitting: Internal Medicine

## 2023-02-01 ENCOUNTER — Encounter (HOSPITAL_COMMUNITY)
Admission: EM | Disposition: A | Discharge status: Skilled Nursing Facility | Payer: Self-pay | Source: Home / Self Care | Attending: Internal Medicine

## 2023-02-01 DIAGNOSIS — S72001A Fracture of unspecified part of neck of right femur, initial encounter for closed fracture: Secondary | ICD-10-CM | POA: Diagnosis not present

## 2023-02-01 DIAGNOSIS — I2510 Atherosclerotic heart disease of native coronary artery without angina pectoris: Secondary | ICD-10-CM

## 2023-02-01 DIAGNOSIS — R001 Bradycardia, unspecified: Secondary | ICD-10-CM

## 2023-02-01 DIAGNOSIS — S72141A Displaced intertrochanteric fracture of right femur, initial encounter for closed fracture: Secondary | ICD-10-CM

## 2023-02-01 HISTORY — PX: INTRAMEDULLARY (IM) NAIL INTERTROCHANTERIC: SHX5875

## 2023-02-01 LAB — COMPREHENSIVE METABOLIC PANEL
ALT: 80 U/L — ABNORMAL HIGH (ref 0–44)
AST: 83 U/L — ABNORMAL HIGH (ref 15–41)
Albumin: 2.6 g/dL — ABNORMAL LOW (ref 3.5–5.0)
Alkaline Phosphatase: 72 U/L (ref 38–126)
Anion gap: 13 (ref 5–15)
BUN: 32 mg/dL — ABNORMAL HIGH (ref 8–23)
CO2: 20 mmol/L — ABNORMAL LOW (ref 22–32)
Calcium: 8.3 mg/dL — ABNORMAL LOW (ref 8.9–10.3)
Chloride: 102 mmol/L (ref 98–111)
Creatinine, Ser: 1.54 mg/dL — ABNORMAL HIGH (ref 0.44–1.00)
GFR, Estimated: 35 mL/min — ABNORMAL LOW (ref >60)
Glucose, Bld: 102 mg/dL — ABNORMAL HIGH (ref 70–99)
Potassium: 4.8 mmol/L (ref 3.5–5.1)
Sodium: 135 mmol/L (ref 135–145)
Total Bilirubin: 1.2 mg/dL (ref 0.3–1.2)
Total Protein: 6.9 g/dL (ref 6.5–8.1)

## 2023-02-01 LAB — CBC
HCT: 23.6 % — ABNORMAL LOW (ref 36.0–46.0)
Hemoglobin: 7.2 g/dL — ABNORMAL LOW (ref 12.0–15.0)
MCH: 25.4 pg — ABNORMAL LOW (ref 26.0–34.0)
MCHC: 30.5 g/dL (ref 30.0–36.0)
MCV: 83.4 fL (ref 80.0–100.0)
Platelets: 332 10*3/uL (ref 150–400)
RBC: 2.83 MIL/uL — ABNORMAL LOW (ref 3.87–5.11)
RDW: 19.8 % — ABNORMAL HIGH (ref 11.5–15.5)
WBC: 10.9 10*3/uL — ABNORMAL HIGH (ref 4.0–10.5)
nRBC: 0 % (ref 0.0–0.2)

## 2023-02-01 LAB — BRAIN NATRIURETIC PEPTIDE: B Natriuretic Peptide: 327.6 pg/mL — ABNORMAL HIGH (ref 0.0–100.0)

## 2023-02-01 LAB — SURGICAL PCR SCREEN
MRSA, PCR: NEGATIVE
Staphylococcus aureus: NEGATIVE

## 2023-02-01 LAB — TROPONIN I (HIGH SENSITIVITY): Troponin I (High Sensitivity): 25 ng/L — ABNORMAL HIGH (ref <18)

## 2023-02-01 SURGERY — FIXATION, FRACTURE, INTERTROCHANTERIC, WITH INTRAMEDULLARY ROD
Anesthesia: General | Site: Hip | Laterality: Right

## 2023-02-01 MED ORDER — ORAL CARE MOUTH RINSE: 15.0000 mL | Freq: Once | OROMUCOSAL | Status: AC

## 2023-02-01 MED ORDER — ATORVASTATIN CALCIUM 40 MG PO TABS
40.0000 mg | ORAL_TABLET | Freq: Every day | ORAL | Status: DC
  Administered 2023-02-02 – 2023-02-05 (×4): 40 mg via ORAL
  Filled 2023-02-01 (×4): qty 1

## 2023-02-01 MED ORDER — SENNOSIDES-DOCUSATE SODIUM 8.6-50 MG PO TABS
1.0000 | ORAL_TABLET | Freq: Two times a day (BID) | ORAL | Status: DC
  Administered 2023-02-01 – 2023-02-05 (×8): 1 via ORAL
  Filled 2023-02-01 (×8): qty 1

## 2023-02-01 MED ORDER — AMIODARONE HCL 200 MG PO TABS
200.0000 mg | ORAL_TABLET | Freq: Two times a day (BID) | ORAL | Status: DC
  Administered 2023-02-01 – 2023-02-05 (×8): 200 mg via ORAL
  Filled 2023-02-01 (×8): qty 1

## 2023-02-01 MED ORDER — SUGAMMADEX SODIUM 200 MG/2ML IV SOLN
INTRAVENOUS | Status: DC | PRN
  Administered 2023-02-01: 100 mg via INTRAVENOUS

## 2023-02-01 MED ORDER — FENTANYL CITRATE (PF) 250 MCG/5ML IJ SOLN
INTRAMUSCULAR | Status: DC | PRN
  Administered 2023-02-01 (×2): 25 ug via INTRAVENOUS

## 2023-02-01 MED ORDER — OXYCODONE HCL 5 MG PO TABS
5.0000 mg | ORAL_TABLET | Freq: Four times a day (QID) | ORAL | Status: DC | PRN
  Administered 2023-02-02 – 2023-02-04 (×3): 5 mg via ORAL
  Filled 2023-02-01 (×4): qty 1

## 2023-02-01 MED ORDER — LIDOCAINE 2% (20 MG/ML) 5 ML SYRINGE
INTRAMUSCULAR | Status: DC | PRN
  Administered 2023-02-01: 30 mg via INTRAVENOUS

## 2023-02-01 MED ORDER — AMIODARONE HCL 200 MG PO TABS: 200.0000 mg | ORAL_TABLET | Freq: Every day | ORAL | Status: DC

## 2023-02-01 MED ORDER — ALBUTEROL SULFATE (2.5 MG/3ML) 0.083% IN NEBU: 2.5000 mg | INHALATION_SOLUTION | RESPIRATORY_TRACT | Status: DC | PRN

## 2023-02-01 MED ORDER — POVIDONE-IODINE 10 % EX SWAB
2.0000 | Freq: Once | CUTANEOUS | Status: AC
  Administered 2023-02-01: 2 via TOPICAL

## 2023-02-01 MED ORDER — SODIUM CHLORIDE 0.9 % IV SOLN
12.5000 mg | Freq: Once | INTRAVENOUS | Status: AC | PRN
  Administered 2023-02-01: 12.5 mg via INTRAVENOUS
  Filled 2023-02-01: qty 0.5

## 2023-02-01 MED ORDER — ACETAMINOPHEN 500 MG PO TABS
1000.0000 mg | ORAL_TABLET | Freq: Once | ORAL | Status: DC
  Filled 2023-02-01: qty 2

## 2023-02-01 MED ORDER — FENTANYL CITRATE PF 50 MCG/ML IJ SOSY
25.0000 ug | PREFILLED_SYRINGE | INTRAMUSCULAR | Status: AC | PRN
Start: 2023-02-01 — End: 2023-02-01
  Administered 2023-02-01 (×2): 25 ug via INTRAVENOUS
  Filled 2023-02-01 (×2): qty 1

## 2023-02-01 MED ORDER — LACTATED RINGERS IV SOLN: INTRAVENOUS | Status: DC | PRN

## 2023-02-01 MED ORDER — AMISULPRIDE (ANTIEMETIC) 5 MG/2ML IV SOLN
INTRAVENOUS | Status: AC
  Filled 2023-02-01: qty 4

## 2023-02-01 MED ORDER — POLYETHYLENE GLYCOL 3350 17 G PO PACK
17.0000 g | PACK | Freq: Every day | ORAL | Status: DC
  Administered 2023-02-02 – 2023-02-05 (×4): 17 g via ORAL
  Filled 2023-02-01 (×4): qty 1

## 2023-02-01 MED ORDER — FENTANYL CITRATE (PF) 100 MCG/2ML IJ SOLN: 25.0000 ug | INTRAMUSCULAR | Status: DC | PRN

## 2023-02-01 MED ORDER — PHENYLEPHRINE 80 MCG/ML (10ML) SYRINGE FOR IV PUSH (FOR BLOOD PRESSURE SUPPORT)
PREFILLED_SYRINGE | INTRAVENOUS | Status: DC | PRN
  Administered 2023-02-01: 160 ug via INTRAVENOUS

## 2023-02-01 MED ORDER — PANTOPRAZOLE SODIUM 40 MG PO TBEC
40.0000 mg | DELAYED_RELEASE_TABLET | Freq: Two times a day (BID) | ORAL | Status: DC
  Administered 2023-02-01 – 2023-02-05 (×8): 40 mg via ORAL
  Filled 2023-02-01 (×8): qty 1

## 2023-02-01 MED ORDER — FENTANYL CITRATE (PF) 250 MCG/5ML IJ SOLN
INTRAMUSCULAR | Status: AC
  Filled 2023-02-01: qty 5

## 2023-02-01 MED ORDER — CHLORHEXIDINE GLUCONATE 0.12 % MT SOLN
15.0000 mL | Freq: Once | OROMUCOSAL | Status: AC
Start: 2023-02-01 — End: 2023-02-01
  Administered 2023-02-01: 15 mL via OROMUCOSAL
  Filled 2023-02-01: qty 15

## 2023-02-01 MED ORDER — CEFAZOLIN SODIUM-DEXTROSE 2-4 GM/100ML-% IV SOLN
2.0000 g | INTRAVENOUS | Status: AC
  Administered 2023-02-01: 2 g via INTRAVENOUS
  Filled 2023-02-01 (×2): qty 100

## 2023-02-01 MED ORDER — AMISULPRIDE (ANTIEMETIC) 5 MG/2ML IV SOLN
10.0000 mg | Freq: Once | INTRAVENOUS | Status: AC
  Administered 2023-02-01: 10 mg via INTRAVENOUS

## 2023-02-01 MED ORDER — PHENYLEPHRINE HCL-NACL 20-0.9 MG/250ML-% IV SOLN
INTRAVENOUS | Status: DC | PRN
  Administered 2023-02-01: 50 ug/min via INTRAVENOUS

## 2023-02-01 MED ORDER — MOMETASONE FURO-FORMOTEROL FUM 100-5 MCG/ACT IN AERO
2.0000 | INHALATION_SPRAY | Freq: Two times a day (BID) | RESPIRATORY_TRACT | Status: DC
  Administered 2023-02-01 – 2023-02-05 (×8): 2 via RESPIRATORY_TRACT
  Filled 2023-02-01 (×3): qty 8.8

## 2023-02-01 MED ORDER — ACETAMINOPHEN 325 MG PO TABS
650.0000 mg | ORAL_TABLET | Freq: Four times a day (QID) | ORAL | Status: DC | PRN
  Administered 2023-02-05: 650 mg via ORAL
  Filled 2023-02-01: qty 2

## 2023-02-01 MED ORDER — CHLORHEXIDINE GLUCONATE 4 % EX SOLN: 60.0000 mL | Freq: Once | CUTANEOUS | Status: DC

## 2023-02-01 MED ORDER — LEVOTHYROXINE SODIUM 75 MCG PO TABS
75.0000 ug | ORAL_TABLET | Freq: Every day | ORAL | Status: DC
  Administered 2023-02-02 – 2023-02-05 (×4): 75 ug via ORAL
  Filled 2023-02-01 (×4): qty 1

## 2023-02-01 MED ORDER — PROPOFOL 10 MG/ML IV BOLUS
INTRAVENOUS | Status: AC
  Filled 2023-02-01: qty 20

## 2023-02-01 MED ORDER — HYDROMORPHONE HCL 1 MG/ML IJ SOLN
0.5000 mg | INTRAMUSCULAR | Status: DC | PRN
  Administered 2023-02-01: 0.5 mg via INTRAVENOUS
  Filled 2023-02-01: qty 0.5

## 2023-02-01 MED ORDER — ROCURONIUM BROMIDE 10 MG/ML (PF) SYRINGE
PREFILLED_SYRINGE | INTRAVENOUS | Status: DC | PRN
  Administered 2023-02-01: 40 mg via INTRAVENOUS

## 2023-02-01 MED ORDER — ALBUMIN HUMAN 5 % IV SOLN: INTRAVENOUS | Status: DC | PRN

## 2023-02-01 MED ORDER — NALOXONE HCL 0.4 MG/ML IJ SOLN: 0.4000 mg | INTRAMUSCULAR | Status: DC | PRN

## 2023-02-01 MED ORDER — PROPOFOL 10 MG/ML IV BOLUS
INTRAVENOUS | Status: DC | PRN
  Administered 2023-02-01: 50 mg via INTRAVENOUS

## 2023-02-01 MED ORDER — 0.9 % SODIUM CHLORIDE (POUR BTL) OPTIME
TOPICAL | Status: DC | PRN
  Administered 2023-02-01: 1000 mL

## 2023-02-01 MED ORDER — CEFAZOLIN SODIUM-DEXTROSE 1-4 GM/50ML-% IV SOLN
1.0000 g | Freq: Two times a day (BID) | INTRAVENOUS | Status: AC
  Administered 2023-02-01 – 2023-02-02 (×2): 1 g via INTRAVENOUS
  Filled 2023-02-01 (×2): qty 50

## 2023-02-01 SURGICAL SUPPLY — 57 items
BAG COUNTER SPONGE SURGICOUNT (BAG) ×2 IMPLANT
BAG SPNG CNTER NS LX DISP (BAG) ×1
BIT DRILL 4.0X280 (BIT) IMPLANT
BNDG CMPR 5X4 KNIT ELC UNQ LF (GAUZE/BANDAGES/DRESSINGS) ×1
BNDG CMPR 5X6 CHSV STRCH STRL (GAUZE/BANDAGES/DRESSINGS)
BNDG CMPR 6 X 5 YARDS HK CLSR (GAUZE/BANDAGES/DRESSINGS) ×1
BNDG COHESIVE 4X5 TAN STRL (GAUZE/BANDAGES/DRESSINGS) ×2 IMPLANT
BNDG COHESIVE 6X5 TAN ST LF (GAUZE/BANDAGES/DRESSINGS) IMPLANT
BNDG ELASTIC 4INX 5YD STR LF (GAUZE/BANDAGES/DRESSINGS) IMPLANT
BNDG ELASTIC 6INX 5YD STR LF (GAUZE/BANDAGES/DRESSINGS) IMPLANT
BNDG GAUZE DERMACEA FLUFF 4 (GAUZE/BANDAGES/DRESSINGS) ×2 IMPLANT
BNDG GZE DERMACEA 4 6PLY (GAUZE/BANDAGES/DRESSINGS) ×2
COVER PERINEAL POST (MISCELLANEOUS) ×2 IMPLANT
COVER SURGICAL LIGHT HANDLE (MISCELLANEOUS) ×2 IMPLANT
DRAPE C-ARM 42X72 X-RAY (DRAPES) IMPLANT
DRAPE C-ARMOR (DRAPES) ×2 IMPLANT
DRAPE STERI IOBAN 125X83 (DRAPES) ×2 IMPLANT
DRESSING MEPILEX FLEX 4X4 (GAUZE/BANDAGES/DRESSINGS) ×2 IMPLANT
DRSG MEPILEX FLEX 4X4 (GAUZE/BANDAGES/DRESSINGS) ×1
DRSG MEPILEX POST OP 4X8 (GAUZE/BANDAGES/DRESSINGS) ×2 IMPLANT
DURAPREP 26ML APPLICATOR (WOUND CARE) ×2 IMPLANT
ELECT REM PT RETURN 9FT ADLT (ELECTROSURGICAL) ×1
ELECTRODE REM PT RTRN 9FT ADLT (ELECTROSURGICAL) ×2 IMPLANT
GAUZE PAD ABD 8X10 STRL (GAUZE/BANDAGES/DRESSINGS) ×4 IMPLANT
GLOVE BIO SURGEON STRL SZ7.5 (GLOVE) IMPLANT
GLOVE BIOGEL PI IND STRL 7.0 (GLOVE) ×4 IMPLANT
GLOVE BIOGEL PI IND STRL 7.5 (GLOVE) ×2 IMPLANT
GLOVE BIOGEL PI IND STRL 8 (GLOVE) IMPLANT
GLOVE ECLIPSE 7.0 STRL STRAW (GLOVE) ×2 IMPLANT
GLOVE SKINSENSE STRL SZ7.5 (GLOVE) ×4 IMPLANT
GLOVE SURG SYN 7.5 E (GLOVE) ×2 IMPLANT
GLOVE SURG SYN 7.5 PF PI (GLOVE) ×4 IMPLANT
GLOVE SURG UNDER POLY LF SZ7 (GLOVE) ×38 IMPLANT
GLOVE SURG UNDER POLY LF SZ7.5 (GLOVE) ×8 IMPLANT
GOWN STRL SURGICAL XL XLNG (GOWN DISPOSABLE) ×2 IMPLANT
KIT BASIN OR (CUSTOM PROCEDURE TRAY) ×2 IMPLANT
KIT TURNOVER KIT B (KITS) ×2 IMPLANT
MANIFOLD NEPTUNE II (INSTRUMENTS) ×2 IMPLANT
NAIL FEM IM 11X200 130D (Nail) IMPLANT
NS IRRIG 1000ML POUR BTL (IV SOLUTION) ×2 IMPLANT
PACK GENERAL/GYN (CUSTOM PROCEDURE TRAY) ×2 IMPLANT
PAD ARMBOARD 7.5X6 YLW CONV (MISCELLANEOUS) ×4 IMPLANT
PAD CAST 4YDX4 CTTN HI CHSV (CAST SUPPLIES) ×4 IMPLANT
PADDING CAST COTTON 4X4 STRL (CAST SUPPLIES) ×2
PIN GUIDE THRD AR 3.2X330 (PIN) IMPLANT
SCREW CORT CAPTR 5X32 (Screw) IMPLANT
SCREW TELESCOP LAG 10.5X95 LT (Screw) IMPLANT
STAPLER VISISTAT 35W (STAPLE) ×2 IMPLANT
SUT MNCRL AB 3-0 PS2 27 (SUTURE) IMPLANT
SUT VIC AB 0 CT1 27 (SUTURE) ×1
SUT VIC AB 0 CT1 27XBRD ANBCTR (SUTURE) ×2 IMPLANT
SUT VIC AB 2-0 CT1 27 (SUTURE) ×1
SUT VIC AB 2-0 CT1 TAPERPNT 27 (SUTURE) ×2 IMPLANT
TOOL ACTIVATION (INSTRUMENTS) IMPLANT
TOWEL GREEN STERILE (TOWEL DISPOSABLE) ×2 IMPLANT
TOWEL GREEN STERILE FF (TOWEL DISPOSABLE) ×2 IMPLANT
WATER STERILE IRR 1000ML POUR (IV SOLUTION) ×2 IMPLANT

## 2023-02-01 NOTE — Consult Note (Signed)
Reason for Consult: Right displaced intertrochanteric hip fracture Referring Physician: [facility]  Zenovia Jarred Jennette Dubin Laurie James is an 77 y.o. female.  HPI: Patient was discharged yesterday from the hospital due to congestive heart failure exacerbation and had a fall at home.  She was brought back to the emergency department where x-rays revealed a displaced intertrochanteric hip fracture.  She was admitted to the hospitalist team with plans for surgery.  She was cleared by them for surgery.  She was evaluated in the preoperative holding area.  She complains of pain in the right hip.  Denies other pain.  Past Medical History:  Diagnosis Date   AMD (age related macular degeneration)    COPD (chronic obstructive pulmonary disease) (HCC)    Depression    Hiatal hernia    Intermittent low back pain    Memory impairment    Midsternal chest pain    a. 12/2011 Cardiac CTA Ca++ score of 103.3 (80th %), LAD <50p/m, RCA 50-75.   Osteoporosis    PAF (paroxysmal atrial fibrillation) (HCC) 09/2020   Paroxysmal atrial fibrillation with RVR (HCC) 11/28/2021   Vitamin B 12 deficiency     Past Surgical History:  Procedure Laterality Date   ABDOMINAL HYSTERECTOMY     ATRIAL FIBRILLATION ABLATION N/A 03/08/2022   Procedure: ATRIAL FIBRILLATION ABLATION;  Surgeon: Maurice Small, MD;  Location: [facility];  Service: Cardiovascular;  Laterality: N/A;   IR THORACENTESIS ASP PLEURAL SPACE W/IMG GUIDE  10/20/2020   IR THORACENTESIS ASP PLEURAL SPACE W/IMG GUIDE  05/02/2022   IR THORACENTESIS ASP PLEURAL SPACE W/IMG GUIDE  09/07/2022   IR THORACENTESIS ASP PLEURAL SPACE W/IMG GUIDE  12/27/2022   RIGHT HEART CATH N/A 09/06/2022   Procedure: RIGHT HEART CATH;  Surgeon: Dorthula Nettles, DO;  Location: [facility];  Service: Cardiovascular;  Laterality: N/A;   RIGHT/LEFT HEART CATH AND CORONARY ANGIOGRAPHY N/A 10/07/2020   Procedure: RIGHT/LEFT HEART CATH AND CORONARY  ANGIOGRAPHY;  Surgeon: Runell Gess, MD;  Location: [facility];  Service: Cardiovascular;  Laterality: N/A;   TEE WITHOUT CARDIOVERSION N/A 10/25/2020   Procedure: TRANSESOPHAGEAL ECHOCARDIOGRAM (TEE);  Surgeon: Quintella Reichert, MD;  Location: [facility] ENDOSCOPY;  Service: Cardiovascular;  Laterality: N/A;    Family History  Problem Relation Age of Onset   Breast cancer Sister    Breast cancer Sister     Social History:  reports that she quit smoking about 2 years ago. Her smoking use included cigarettes. She started smoking about 67 years ago. She has a 65 pack-year smoking history. She has never used smokeless tobacco. She reports that she does not drink alcohol and does not use drugs.  Allergies:  Allergies  Allergen Reactions   Codeine Nausea And Vomiting and Other (See Comments)    Upsets the stomach    Medications: I have reviewed the patient's current medications.  Results for orders placed or performed during the hospital encounter of 01/31/23 (from the past 48 hour(s))  Basic metabolic panel     Status: Abnormal   Collection Time: 01/31/23  8:20 PM  Result Value Ref Range   Sodium 135 135 - 145 mmol/L   Potassium 4.2 3.5 - 5.1 mmol/L   Chloride 100 98 - 111 mmol/L   CO2 22 22 - 32 mmol/L   Glucose, Bld 120 (H) 70 - 99 mg/dL    Comment: Glucose reference range applies only to samples taken after fasting for at least 8 hours.  BUN 27 (H) 8 - 23 mg/dL   Creatinine, Ser 6.57 (H) 0.44 - 1.00 mg/dL   Calcium 8.6 (L) 8.9 - 10.3 mg/dL   GFR, Estimated 39 (L) >60 mL/min    Comment: (NOTE) Calculated using the CKD-EPI Creatinine Equation (2021)    Anion gap 13 5 - 15    Comment: Performed at [facility] Lab, [address], [place], [place] [address]  CBC with Differential     Status: Abnormal   Collection Time: 01/31/23  8:20 PM  Result Value Ref Range   WBC 12.7 (H) 4.0 - 10.5 K/uL   RBC 3.14 (L) 3.87 - 5.11 MIL/uL   Hemoglobin 8.1 (L) 12.0 - 15.0 g/dL   HCT  29.5 (L) 28.4 - 46.0 %   MCV 83.4 80.0 - 100.0 fL   MCH 25.8 (L) 26.0 - 34.0 pg   MCHC 30.9 30.0 - 36.0 g/dL   RDW 13.2 (H) 44.0 - 10.2 %   Platelets 374 150 - 400 K/uL   nRBC 0.0 0.0 - 0.2 %   Neutrophils Relative % 91 %   Neutro Abs 11.6 (H) 1.7 - 7.7 K/uL   Lymphocytes Relative 3 %   Lymphs Abs 0.4 (L) 0.7 - 4.0 K/uL   Monocytes Relative 0 %   Monocytes Absolute 0.0 (L) 0.1 - 1.0 K/uL   Eosinophils Relative 6 %   Eosinophils Absolute 0.8 (H) 0.0 - 0.5 K/uL   Basophils Relative 0 %   Basophils Absolute 0.0 0.0 - 0.1 K/uL   nRBC 0 0 /100 WBC   Abs Immature Granulocytes 0.00 0.00 - 0.07 K/uL   Polychromasia PRESENT     Comment: Performed at [facility] Lab, [address] [place], [place] [address]  Type and screen [facility]     Status: None   Collection Time: 01/31/23  8:20 PM  Result Value Ref Range   ABO/RH(D) O NEG    Antibody Screen NEG    Sample Expiration      02/03/2023,2359 Performed at [facility] Lab, [address] [place], [place] [address]   ABO/Rh     Status: None   Collection Time: 01/31/23  8:25 PM  Result Value Ref Range   ABO/RH(D)      O NEG Performed at [facility] Lab, [address] [place], [place] [address]   Protime-INR     Status: Abnormal   Collection Time: 01/31/23  8:34 PM  Result Value Ref Range   Prothrombin Time 21.3 (H) 11.4 - 15.2 seconds   INR 1.8 (H) 0.8 - 1.2    Comment: (NOTE) INR goal varies based on device and disease states. Performed at [facility] Lab, [address] [place], [place] [address]   Brain natriuretic peptide     Status: Abnormal   Collection Time: 02/01/23  4:30 AM  Result Value Ref Range   B Natriuretic Peptide 327.6 (H) 0.0 - 100.0 pg/mL    Comment: Performed at [facility], [address], [place], [place] [address]  Troponin I (High Sensitivity)     Status: Abnormal   Collection Time: 02/01/23  4:30 AM  Result Value Ref Range   Troponin I (High Sensitivity) 25  (H) <18 ng/L    Comment: (NOTE) Elevated high sensitivity troponin I (hsTnI) values and significant  changes across serial measurements may suggest ACS but many other  chronic and acute conditions are known to elevate hsTnI results.  Refer  to the "Links" section for chest pain algorithms and additional  guidance. Performed at [facility] Lab, [address] [place], [place] [address]   CBC     Status: Abnormal   Collection Time: 02/01/23  4:30 AM  Result Value Ref Range   WBC 10.9 (H) 4.0 - 10.5 K/uL   RBC 2.83 (L) 3.87 - 5.11 MIL/uL   Hemoglobin 7.2 (L) 12.0 - 15.0 g/dL   HCT 60.4 (L) 54.0 - 98.1 %   MCV 83.4 80.0 - 100.0 fL   MCH 25.4 (L) 26.0 - 34.0 pg   MCHC 30.5 30.0 - 36.0 g/dL   RDW 19.1 (H) 47.8 - 29.5 %   Platelets 332 150 - 400 K/uL   nRBC 0.0 0.0 - 0.2 %    Comment: Performed at [facility] Lab, [address], [place], [place] [address]  Comprehensive metabolic panel     Status: Abnormal   Collection Time: 02/01/23  8:35 AM  Result Value Ref Range   Sodium 135 135 - 145 mmol/L   Potassium 4.8 3.5 - 5.1 mmol/L   Chloride 102 98 - 111 mmol/L   CO2 20 (L) 22 - 32 mmol/L   Glucose, Bld 102 (H) 70 - 99 mg/dL    Comment: Glucose reference range applies only to samples taken after fasting for at least 8 hours.   BUN 32 (H) 8 - 23 mg/dL   Creatinine, Ser 8.65 (H) 0.44 - 1.00 mg/dL   Calcium 8.3 (L) 8.9 - 10.3 mg/dL   Total Protein 6.9 6.5 - 8.1 g/dL   Albumin 2.6 (L) 3.5 - 5.0 g/dL   AST 83 (H) 15 - 41 U/L   ALT 80 (H) 0 - 44 U/L   Alkaline Phosphatase 72 38 - 126 U/L   Total Bilirubin 1.2 0.3 - 1.2 mg/dL   GFR, Estimated 35 (L) >60 mL/min    Comment: (NOTE) Calculated using the CKD-EPI Creatinine Equation (2021)    Anion gap 13 5 - 15    Comment: Performed at [facility] Lab, [address] [place], [place] [address]    DG Hip Unilat With Pelvis 2-3 Views Right  Result Date: 01/31/2023 CLINICAL DATA:  Fall EXAM: DG HIP (WITH OR WITHOUT PELVIS) 2-3V  RIGHT COMPARISON:  None Available. FINDINGS: There is an acute comminuted right femoral intratrochanteric fracture. There is 1 shaft with superolateral displacement of the distal fracture fragment. There is no dislocation. IMPRESSION: Acute comminuted right femoral intratrochanteric fracture. Electronically Signed   By: Darliss Cheney M.D.   On: 01/31/2023 21:42   DG Chest Port 1 View  Result Date: 01/31/2023 CLINICAL DATA:  Fall EXAM: PORTABLE CHEST 1 VIEW COMPARISON:  Chest x-ray 01/30/2023 FINDINGS: Moderate right effusion is stable. Small left pleural effusion has decreased from prior. The heart is enlarged. There some interstitial opacities in the lower lungs, unchanged. Biapical pleural-parenchymal scarring again noted. No pneumothorax or acute fracture. Osseous structures are stable. IMPRESSION: 1. Moderate right pleural effusion is stable. Small left pleural effusion has decreased from prior. 2. Cardiomegaly with interstitial opacities in the lower lungs, unchanged. Electronically Signed   By: Darliss Cheney M.D.   On: 01/31/2023 21:41   DG Chest 1 View  Result Date: 01/30/2023 CLINICAL DATA:  Shortness of breath EXAM: CHEST  1 VIEW COMPARISON:  Chest x-ray 12/27/2022 FINDINGS: There are small bilateral pleural effusions. There is cardiomegaly and central pulmonary vascular congestion. No evidence for pneumothorax or acute fracture. IMPRESSION: 1. Cardiomegaly  and central pulmonary vascular congestion. 2. Small bilateral pleural effusions. Electronically Signed   By: Darliss Cheney M.D.   On: 01/30/2023 20:18    Review of Systems  Constitutional: Negative.   HENT: Negative.    Respiratory: Negative.    Cardiovascular: Negative.   Musculoskeletal:  Positive for arthralgias.  Skin:  Positive for wound.  All other systems reviewed and are negative.  Blood pressure 107/62, pulse 70, temperature 98.2 F (36.8 C), temperature source Oral, resp. rate (!) 21, height 4\' 8"  (1.422 m), weight 49 kg,  SpO2 100%. Physical Exam HENT:     Head: Normocephalic.     Mouth/Throat:     Mouth: Mucous membranes are dry.  Eyes:     Extraocular Movements: Extraocular movements intact.  Cardiovascular:     Rate and Rhythm: Normal rate.  Pulmonary:     Effort: Pulmonary effort is normal.  Abdominal:     Palpations: Abdomen is soft.  Musculoskeletal:     Cervical back: Neck supple.     Comments: Ecchymosis and swelling around the area of the right hip.  Tender to palpation there.  The leg is externally rotated and shortened.  Distally patient is able to wiggle toes and ankle.  Sensation grossly intact to light touch distally.  Skin:    General: Skin is warm.  Neurological:     General: No focal deficit present.     Mental Status: She is alert.  Psychiatric:        Mood and Affect: Mood normal.     Assessment/Plan: Right displaced and comminuted intertrochanteric hip fracture.  Will plan for operative treatment of her fracture.  Blood thinner has been held.  We will restart postoperative day 1 so long as there is no large drop in her hemoglobin.  Will plan to work with physical therapy postoperatively for mobilization.  We discussed the risk, benefits and alternatives of surgery which include but not limited to wound healing complication, infection, nonunion, need for further surgery and damage surrounding structures.  After weighing these risks she agreed to proceed with surgery.  Terance Hart 02/01/2023, 11:13 AM

## 2023-02-01 NOTE — Progress Notes (Signed)
Heart Failure Navigator Progress Note  Assessed for Heart & Vascular TOC clinic readiness.  Patient does not meet criteria due to . Advanced Heart Failure Team patient of Dr. Gala Romney.   Navigator will sign off at this time.,   Rhae Hammock, BSN, Scientist, clinical (histocompatibility and immunogenetics) Only

## 2023-02-01 NOTE — ED Notes (Signed)
Heels elevated off of bed

## 2023-02-01 NOTE — TOC CAGE-AID Note (Signed)
Transition of Care [facility]) - CAGE-AID Screening   Patient Details  Name: Laurie James MRN: [id number] Date of Birth: 1945-11-29  [facility] of [facility]) CM/SW Contact:    Katha Hamming, RN Phone Number: 02/01/2023, 9:11 PM   Clinical Narrative:  Denies drug/alcohol use  CAGE-AID Screening:    Have You Ever Felt You Ought to Cut Down on Your Drinking or Drug Use?: No Have People Annoyed You By Critizing Your Drinking Or Drug Use?: No Have You Felt Bad Or Guilty About Your Drinking Or Drug Use?: No Have You Ever Had a Drink or Used Drugs First Thing In The Morning to Steady Your Nerves or to Get Rid of a Hangover?: No CAGE-AID Score: 0  Substance Abuse Education Offered: No

## 2023-02-01 NOTE — Transfer of Care (Signed)
Immediate Anesthesia Transfer of Care Note  Patient: Laurie James [facility]  Procedure(s) Performed: INTRAMEDULLARY (IM) NAIL INTERTROCHANTERIC (Right: Hip)  Patient Location: PACU  Anesthesia Type:General  Level of Consciousness: awake and alert   Airway & Oxygen Therapy: Patient Spontanous Breathing and Patient connected to nasal cannula oxygen  Post-op Assessment: Report given to RN and Post -op Vital signs reviewed and stable  Post vital signs: Reviewed and stable  Last Vitals:  Vitals Value Taken Time  BP 156/68 02/01/23 1340  Temp    Pulse 65 02/01/23 1342  Resp 18 02/01/23 1342  SpO2 100 % 02/01/23 1342  Vitals shown include unfiled device data.  Last Pain:  Vitals:   02/01/23 1041  TempSrc: Oral  PainSc: 5          Complications: No notable events documented.

## 2023-02-01 NOTE — Anesthesia Preprocedure Evaluation (Addendum)
Anesthesia Evaluation  Patient identified by MRN, date of birth, ID band Patient awake    Reviewed: Allergy & Precautions, NPO status , Patient's Chart, lab work & pertinent test results  Airway Mallampati: I  TM Distance: >3 FB Neck ROM: Full    Dental  (+) Edentulous Upper, Edentulous Lower, Dental Advisory Given   Pulmonary sleep apnea and Continuous Positive Airway Pressure Ventilation , COPD,  COPD inhaler, former smoker   Pulmonary exam normal breath sounds clear to auscultation       Cardiovascular pulmonary hypertension+ CAD and +CHF  Normal cardiovascular exam+ dysrhythmias Atrial Fibrillation  Rhythm:Regular Rate:Normal  TTE 2024 1. Left ventricular ejection fraction, by estimation, is 55 to 60%. The  left ventricle has normal function. The left ventricle has no regional  wall motion abnormalities. Left ventricular diastolic parameters are  consistent with Grade II diastolic  dysfunction (pseudonormalization).   2. Right ventricular systolic function is normal. The right ventricular  size is mildly enlarged.   3. Left atrial size was severely dilated.   4. Right atrial size was severely dilated.   5. The mitral valve is normal in structure. Trivial mitral valve  regurgitation. No evidence of mitral stenosis.   6. Tricuspid valve regurgitation is severe.   7. The aortic valve is tricuspid. Aortic valve regurgitation is trivial.  Aortic valve sclerosis is present, with no evidence of aortic valve  stenosis.   8. The inferior vena cava is normal in size with greater than 50%  respiratory variability, suggesting right atrial pressure of 3 mmHg.     Neuro/Psych  PSYCHIATRIC DISORDERS  Depression    negative neurological ROS     GI/Hepatic Neg liver ROS, hiatal hernia,GERD  ,,  Endo/Other  Hypothyroidism    Renal/GU Renal InsufficiencyRenal diseaseLab Results      Component                Value               Date                       NA                       135                 02/01/2023                CL                       102                 02/01/2023                K                        4.8                 02/01/2023                CO2                      20 (L)              02/01/2023                BUN  32 (H)              02/01/2023                CREATININE               1.54 (H)            02/01/2023                GFRNONAA                 35 (L)              02/01/2023                CALCIUM                  8.3 (L)             02/01/2023                PHOS                     3.8                 10/20/2020                ALBUMIN                  2.6 (L)             02/01/2023                GLUCOSE                  102 (H)             02/01/2023             negative genitourinary   Musculoskeletal negative musculoskeletal ROS (+)    Abdominal   Peds  Hematology  (+) Blood dyscrasia (eliquis), anemia Lab Results      Component                Value               Date                      WBC                      10.9 (H)            02/01/2023                HGB                      7.2 (L)             02/01/2023                HCT                      23.6 (L)            02/01/2023                MCV                      83.4                02/01/2023  PLT                      332                 02/01/2023              Anesthesia Other Findings   Reproductive/Obstetrics                             Anesthesia Physical Anesthesia Plan  ASA: 3  Anesthesia Plan: General   Post-op Pain Management: Ofirmev IV (intra-op)*   Induction: Intravenous  PONV Risk Score and Plan: 3 and Dexamethasone, Ondansetron and Treatment may vary due to age or medical condition  Airway Management Planned: Oral ETT  Additional Equipment:   Intra-op Plan:   Post-operative Plan: Extubation in OR  Informed Consent: I have  reviewed the patients History and Physical, chart, labs and discussed the procedure including the risks, benefits and alternatives for the proposed anesthesia with the patient or authorized representative who has indicated his/her understanding and acceptance.   Patient has DNR.  Discussed DNR with patient and Suspend DNR.   Dental advisory given  Plan Discussed with: CRNA  Anesthesia Plan Comments:        Anesthesia Quick Evaluation

## 2023-02-01 NOTE — Progress Notes (Signed)
PHARMACY NOTE:  ANTIMICROBIAL RENAL DOSAGE ADJUSTMENT  Current antimicrobial regimen includes a mismatch between antimicrobial dosage and estimated renal function.  As per policy approved by the Pharmacy & Therapeutics and Medical Executive Committees, the antimicrobial dosage will be adjusted accordingly.  Current antimicrobial dosage:  Ancef 1gm IV Q6H x 3 doses  Indication: surgical prophylaxis  Renal Function:  Estimated Creatinine Clearance: 20.6 mL/min (A) (by C-G formula based on SCr of 1.54 mg/dL (H)). []      On intermittent HD, scheduled: []      On CRRT    Antimicrobial dosage has been changed to:  Ancef 1gm IV Q12H x 2 doses   Atom Solivan D. Laney Potash, PharmD, BCPS, BCCCP 02/01/2023, 7:56 PM

## 2023-02-01 NOTE — H&P (Signed)
History and Physical    Laurie James UJW:[id number] DOB: 1945/10/03 DOA: 01/31/2023  PCP: Georgann Housekeeper, MD  Patient coming from: Home  Chief Complaint: Right hip pain  HPI: Laurie James Solari is a 77 y.o. female with medical history significant of chronic HFpEF, severe tricuspid regurgitation, recurrent right pleural effusion, paroxysmal A-fib on Eliquis, CKD stage IIIa, hyperlipidemia, hypothyroidism, age-related macular degeneration/blindness, CAD, COPD, hypertension, hiatal hernia, GERD, depression.  Recently admitted 10/22 for acute CHF exacerbation and was discharged home yesterday.  Patient returned to the ED in the evening via EMS for evaluation of right hip pain and deformity after a mechanical fall.  In the ED, patient slightly bradycardic with heart rate in the 50s but not hypotensive.  Labs significant for WBC 12.7, hemoglobin 8.1 (stable), MCV 83.4, creatinine 1.3 (at baseline).  X-ray of right hip/pelvis showing acute comminuted right femoral intratrochanteric fracture.  Chest x-ray showing moderate right pleural effusion which is stable and small left pleural effusion which has decreased in size compared to prior imaging.  In addition, showing cardiomegaly with interstitial opacities in the lower lungs, unchanged. Orthopedics consulted and planning on surgery once medically stable.  Recommended keeping n.p.o. after midnight in case surgery can be performed tomorrow.  Bedrest for now and hold anticoagulation.  Patient was given Zofran and fentanyl.  TRH called to admit.  Patient states she was discharged from the hospital yesterday and family picked her up from the hospital and drove her home.  As she was trying to get out of the car to go into her house, she lost her balance and fell onto her right hip.  She was not able to get up from the floor due to severe pain in her right hip.  Denies head injury or loss of consciousness.  She is complaining of nausea since  after receiving pain medications.  No vomiting.  Denies chest pain, shortness breath, or abdominal pain.  No other complaints.  Review of Systems:  Review of Systems  All other systems reviewed and are negative.   Past Medical History:  Diagnosis Date   AMD (age related macular degeneration)    COPD (chronic obstructive pulmonary disease) (HCC)    Depression    Hiatal hernia    Intermittent low back pain    Memory impairment    Midsternal chest pain    a. 12/2011 Cardiac CTA Ca++ score of 103.3 (80th %), LAD <50p/m, RCA 50-75.   Osteoporosis    PAF (paroxysmal atrial fibrillation) (HCC) 09/2020   Paroxysmal atrial fibrillation with RVR (HCC) 11/28/2021   Vitamin B 12 deficiency     Past Surgical History:  Procedure Laterality Date   ABDOMINAL HYSTERECTOMY     ATRIAL FIBRILLATION ABLATION N/A 03/08/2022   Procedure: ATRIAL FIBRILLATION ABLATION;  Surgeon: Maurice Small, MD;  Location: [facility];  Service: Cardiovascular;  Laterality: N/A;   IR THORACENTESIS ASP PLEURAL SPACE W/IMG GUIDE  10/20/2020   IR THORACENTESIS ASP PLEURAL SPACE W/IMG GUIDE  05/02/2022   IR THORACENTESIS ASP PLEURAL SPACE W/IMG GUIDE  09/07/2022   IR THORACENTESIS ASP PLEURAL SPACE W/IMG GUIDE  12/27/2022   RIGHT HEART CATH N/A 09/06/2022   Procedure: RIGHT HEART CATH;  Surgeon: Dorthula Nettles, DO;  Location: [facility];  Service: Cardiovascular;  Laterality: N/A;   RIGHT/LEFT HEART CATH AND CORONARY ANGIOGRAPHY N/A 10/07/2020   Procedure: RIGHT/LEFT HEART CATH AND CORONARY ANGIOGRAPHY;  Surgeon: Runell Gess, MD;  Location: [facility];  Service: Cardiovascular;  Laterality: N/A;   TEE WITHOUT CARDIOVERSION N/A 10/25/2020   Procedure: TRANSESOPHAGEAL ECHOCARDIOGRAM (TEE);  Surgeon: Quintella Reichert, MD;  Location: [facility] ENDOSCOPY;  Service: Cardiovascular;  Laterality: N/A;     reports that she quit smoking about 2 years ago. Her smoking use included cigarettes. She started  smoking about 67 years ago. She has a 65 pack-year smoking history. She has never used smokeless tobacco. She reports that she does not drink alcohol and does not use drugs.  Allergies  Allergen Reactions   Codeine Nausea And Vomiting and Other (See Comments)    Upsets the stomach    Family History  Problem Relation Age of Onset   Breast cancer Sister    Breast cancer Sister     Prior to Admission medications   Medication Sig Start Date End Date Taking? Authorizing Provider  albuterol (VENTOLIN HFA) 108 (90 Base) MCG/ACT inhaler Inhale 2 puffs into the lungs every 4 (four) hours as needed for wheezing. 06/08/20   [provider]  amiodarone (PACERONE) 200 MG tablet Take 1 tablet (200 mg total) by mouth 2 (two) times daily for 30 days, THEN 1 tablet (200 mg total) daily. 01/10/23 02/09/24  Eustace Pen, PA-C  apixaban Everlene Balls) 2.5 MG TABS tablet Take 1 tablet by mouth twice daily 01/30/23   Bensimhon, Bevelyn Buckles, MD  atorvastatin (LIPITOR) 40 MG tablet Stop taking until you see your PCP and obtain repeat labwork. 01/31/23   Narda Bonds, MD  budesonide-formoterol (SYMBICORT) 80-4.5 MCG/ACT inhaler Inhale 2 puffs into the lungs in the morning and at bedtime. 10/30/22   Briant Cedar, MD  cyanocobalamin (,VITAMIN B-12,) 1000 MCG/ML injection Inject 1,000 mcg into the muscle every 30 (thirty) days. 06/11/16   [provider]  furosemide (LASIX) 20 MG tablet Take 1 tablet (20 mg total) by mouth every Monday, Wednesday, and Friday. Leg edema 01/12/23   Jacklynn Ganong, FNP  mupirocin ointment (BACTROBAN) 2 % Apply 1 Application topically daily. 01/15/23   [provider]  nitroGLYCERIN (NITROSTAT) 0.4 MG SL tablet Place 1 tablet (0.4 mg total) under the tongue every 5 (five) minutes x 3 doses as needed for chest pain. 10/11/20   Filbert Schilder, NP  ondansetron (ZOFRAN) 4 MG tablet Take 4 mg by mouth 3 (three) times daily as needed for nausea or vomiting. 11/22/20    [provider]  pantoprazole (PROTONIX) 40 MG tablet Take 1 tablet (40 mg total) by mouth 2 (two) times daily. 09/05/22   Bensimhon, Bevelyn Buckles, MD  potassium chloride (KLOR-CON) 10 MEQ tablet Take 1 tablet (10 mEq total) by mouth every Monday, Wednesday, and Friday. 01/12/23   Jacklynn Ganong, FNP  SYNTHROID 75 MCG tablet Take 75 mcg by mouth daily before breakfast. 10/18/22   [provider]    Physical Exam: Vitals:   01/31/23 2245 01/31/23 2300 01/31/23 2315 01/31/23 2330  BP: 105/61 110/60 107/63 123/60  Pulse: (!) 55 (!) 56 (!) 56 (!) 58  Resp: 18 17 18  (!) 23  Temp:      TempSrc:      SpO2: 100% 100% 100% 100%  Weight:      Height:        Physical Exam Vitals reviewed.  Constitutional:      General: She is not in acute distress. HENT:     Head: Normocephalic and atraumatic.  Cardiovascular:     Rate and Rhythm: Normal rate and regular rhythm.  Pulses: Normal pulses.  Pulmonary:     Effort: Pulmonary effort is normal. No respiratory distress.     Breath sounds: No wheezing or rales.  Abdominal:     General: Bowel sounds are normal. There is no distension.     Palpations: Abdomen is soft.     Tenderness: There is no abdominal tenderness. There is no guarding.  Musculoskeletal:     Cervical back: Normal range of motion.     Right lower leg: No edema.     Left lower leg: No edema.     Comments: Right lower extremity shortened and externally rotated.  Neurovascularly intact.  Skin:    General: Skin is warm and dry.  Neurological:     Mental Status: She is alert and oriented to person, place, and time.     Labs on Admission: I have personally reviewed following labs and imaging studies  CBC: Recent Labs  Lab 01/30/23 1743 01/31/23 0749 01/31/23 2020  WBC 8.7 7.9 12.7*  NEUTROABS  --   --  11.6*  HGB 8.4* 8.2* 8.1*  HCT 28.4* 26.8* 26.2*  MCV 88.2 83.5 83.4  PLT 372 345 374   Basic Metabolic Panel: Recent Labs  Lab 01/30/23 1743  01/31/23 0749 01/31/23 2020  NA 135 136 135  K 4.2 3.7 4.2  CL 108 103 100  CO2 20* 22 22  GLUCOSE 98 100* 120*  BUN 24* 23 27*  CREATININE 1.15* 1.26* 1.39*  CALCIUM 8.4* 8.3* 8.6*  MG  --  1.9  --    GFR: Estimated Creatinine Clearance: 22.2 mL/min (A) (by C-G formula based on SCr of 1.39 mg/dL (H)). Liver Function Tests: Recent Labs  Lab 01/30/23 1743  AST 95*  ALT 84*  ALKPHOS 79  BILITOT 1.1  PROT 7.3  ALBUMIN 2.8*   No results for input(s): "LIPASE", "AMYLASE" in the last 168 hours. No results for input(s): "AMMONIA" in the last 168 hours. Coagulation Profile: Recent Labs  Lab 01/31/23 2034  INR 1.8*   Cardiac Enzymes: No results for input(s): "CKTOTAL", "CKMB", "CKMBINDEX", "TROPONINI" in the last 168 hours. BNP (last 3 results) No results for input(s): "PROBNP" in the last 8760 hours. HbA1C: No results for input(s): "HGBA1C" in the last 72 hours. CBG: No results for input(s): "GLUCAP" in the last 168 hours. Lipid Profile: No results for input(s): "CHOL", "HDL", "LDLCALC", "TRIG", "CHOLHDL", "LDLDIRECT" in the last 72 hours. Thyroid Function Tests: Recent Labs    01/30/23 1743  TSH 6.653*   Anemia Panel: No results for input(s): "VITAMINB12", "FOLATE", "FERRITIN", "TIBC", "IRON", "RETICCTPCT" in the last 72 hours. Urine analysis:    Component Value Date/Time   COLORURINE YELLOW 05/01/2022 1922   APPEARANCEUR HAZY (A) 05/01/2022 1922   LABSPEC 1.029 05/01/2022 1922   PHURINE 5.0 05/01/2022 1922   GLUCOSEU 50 (A) 05/01/2022 1922   HGBUR SMALL (A) 05/01/2022 1922   BILIRUBINUR NEGATIVE 05/01/2022 1922   KETONESUR NEGATIVE 05/01/2022 1922   PROTEINUR 100 (A) 05/01/2022 1922   NITRITE NEGATIVE 05/01/2022 1922   LEUKOCYTESUR NEGATIVE 05/01/2022 1922    Radiological Exams on Admission: DG Hip Unilat With Pelvis 2-3 Views Right  Result Date: 01/31/2023 CLINICAL DATA:  Fall EXAM: DG HIP (WITH OR WITHOUT PELVIS) 2-3V RIGHT COMPARISON:  None  Available. FINDINGS: There is an acute comminuted right femoral intratrochanteric fracture. There is 1 shaft with superolateral displacement of the distal fracture fragment. There is no dislocation. IMPRESSION: Acute comminuted right femoral intratrochanteric fracture. Electronically Signed   By:  Darliss Cheney M.D.   On: 01/31/2023 21:42   DG Chest Port 1 View  Result Date: 01/31/2023 CLINICAL DATA:  Fall EXAM: PORTABLE CHEST 1 VIEW COMPARISON:  Chest x-ray 01/30/2023 FINDINGS: Moderate right effusion is stable. Small left pleural effusion has decreased from prior. The heart is enlarged. There some interstitial opacities in the lower lungs, unchanged. Biapical pleural-parenchymal scarring again noted. No pneumothorax or acute fracture. Osseous structures are stable. IMPRESSION: 1. Moderate right pleural effusion is stable. Small left pleural effusion has decreased from prior. 2. Cardiomegaly with interstitial opacities in the lower lungs, unchanged. Electronically Signed   By: Darliss Cheney M.D.   On: 01/31/2023 21:41   DG Chest 1 View  Result Date: 01/30/2023 CLINICAL DATA:  Shortness of breath EXAM: CHEST  1 VIEW COMPARISON:  Chest x-ray 12/27/2022 FINDINGS: There are small bilateral pleural effusions. There is cardiomegaly and central pulmonary vascular congestion. No evidence for pneumothorax or acute fracture. IMPRESSION: 1. Cardiomegaly and central pulmonary vascular congestion. 2. Small bilateral pleural effusions. Electronically Signed   By: Darliss Cheney M.D.   On: 01/30/2023 20:18    EKG: Pending at this time.  Assessment and Plan  Acute comminuted right femoral intratrochanteric fracture Secondary to a mechanical fall. Orthopedics consulted and planning on surgery once medically stable.  Recommended keeping n.p.o. after midnight in case surgery can be performed tomorrow.  Bedrest for now and hold anticoagulation.  Continue pain management.  Chronic HFpEF Severe tricuspid valve  regurgitation Echo done in July 2024 showing EF 55 to 60%, grade 2 diastolic dysfunction, trivial mitral valve regurgitation, severe tricuspid valve regurgitation, and trivial aortic valve regurgitation.  Patient was recently admitted 10/22 for acute CHF exacerbation and was discharged home yesterday on Lasix 20 mg 3 times a week.  Does not appear volume overloaded on exam at this time.  Check BNP.  Mild bradycardia Patient slightly bradycardic with heart rate 50-60s.  Blood pressure stable.  She is not on any beta-blocker or calcium channel blocker.  TSH checked 2 days ago was elevated but improved compared to labs done 4 weeks ago.  EKG pending.  Continue cardiac monitoring.  Pleural effusions Patient was diuresed during recent hospitalization. Chest x-ray showing moderate right pleural effusion which is stable and small left pleural effusion which has decreased in size compared to prior imaging.   Mild leukocytosis Likely reactive.  No fever or infectious signs or symptoms.  Repeat CBC in a.m.  Chronic anemia Hemoglobin stable.  Type and screen, continue to monitor labs.  CKD stage IIIa Creatinine at baseline, continue to monitor labs.  CAD Patient is not endorsing any chest pain or discomfort.  EKG pending.  Check troponin.  Paroxysmal A-fib: Hold Eliquis at this time. Hypothyroidism COPD: Stable, no signs of acute exacerbation. Hypertension: Blood pressure stable. Hiatal hernia/GERD Depression Pharmacy med rec pending.  DVT prophylaxis: SCDs Code Status: DNR (discussed with the patient) Family Communication: Son at bedside. Consults called: Orthopedics Level of care: Telemetry bed Admission status: It is my clinical opinion that admission to INPATIENT is reasonable and necessary because of the expectation that this patient will require hospital care that crosses at least 2 midnights to treat this condition based on the medical complexity of the problems presented.  Given the  aforementioned information, the predictability of an adverse outcome is felt to be significant.  John Giovanni MD [facility]  If 7PM-7AM, please contact night-coverage [email or web address]  02/01/2023, 12:16 AM

## 2023-02-01 NOTE — Progress Notes (Signed)
Brief same day note:  Patient is a 77 year old female with history of diastolic CHF, severe TR, recurrent right pleural effusion, paroxysmal A-fib on Eliquis, CKD stage IIIa, hyperlipidemia, hypothyroidism, age-related macular degeneration/blindness, coronary artery disease, COPD, GERD who presented from home with complaint of right hip pain after fall.  She was discharged home on 10/22 after she was admitted for CHF exacerbation .  She was trying to get out of the car to go into her house, she lost her balance and fell on her right hip.  X-ray of the right hip/pelvis showed acute commuted right femoral intratrochanteric fracture.  Chest x-ray showed moderate right pleural effusion, small left pleural effusion, cardiomegaly with unchanged interstitial opacities.  Orthopedics consulted, plan for ORIF.  Patient seen and examined the bedside this morning.  In my evaluation, she was hemodynamically stable.  Blood pressure stable.  EKG monitor showed normal sinus rhythm.  She is on room air.  She complains of pain on the right hip, was slightly nauseous.  Denied any shortness of breath or chest pain.  No peripheral edema.  Assessment and plan:  Acute comminuted right femoral intertrochanteric fracture: Secondary to mechanical fall.  Orthopedics consulted, plan for surgery .  Continue pain management, supportive care.  PT/OT evaluation after surgery  Chronic diastolic CHF/severe TR: Echo done on 7/24 showed EF of 55 to 60%, grade 2 diastolic dysfunction, severe TR.  Patient was recently admitted for diastolic CHF exacerbation and was discharged .  BNP has improved since discharge date.  Currently she appears euvolemic.  On room air.  No peripheral edema  Pleural effusion: Chest x-ray showed moderate right pleural effusion which is stable, small left pleural effusion which has decreased in size compared to previous imaging.  Currently she is on room air.  Denies notes of breath or cough.  Leukocytosis:  Currently most likely reactive.  No signs of infection  Chronic normocytic anemia: Currently hemoglobin stable.  Likely associated with chronic medical problems, no signs of acute blood loss.  She might need blood transfusion after surgery  CKD stage IIIa: Currently creatinine at baseline.  Coronary artery disease: No anginal symptoms.  Troponin slightly elevated which is most likely from demand ischemia from CHF.  Paroxysmal A-fib: Currently in normal sinus rhythm.  On Eliquis for anticoagulation, currently on hold  At this point, surgery is indicated for the management of her intertrochanteric fracture.  Patient is hemodynamically stable.  No anginal symptoms.  CHF looks compensated.  She is on room air. She can proceed with surgery.

## 2023-02-01 NOTE — ED Notes (Signed)
Admitting MD at bedside updated pt. And family on POC

## 2023-02-01 NOTE — Op Note (Signed)
Laurie James female 77 y.o. 02/01/2023  PreOperative Diagnosis: Right intertochanteric hip fracture  PostOperative Diagnosis: same  PROCEDURE: Cephalomedullary nail of right hip fracture  SURGEON: Dub Mikes, MD  ASSISTANT: Jesse [place], PA-C was necessary for patient positioning, prep, drape, assistance with fracture reduction and placement of hardware.  ANESTHESIA: General endotracheal tube anesthesia  FINDINGS: Displaced intertrochanteric hip fracture  IMPLANTS: Arthrex intertrochanteric hip nail  INDICATIONS:77 y.o. femalehad a fall and sustained an intertrochanteric hip fracture.  She was indicated for surgery due to baseline ambulatory status and significant pain due to the fracture.  Discussion was had with the patient about the surgery and they wish to proceed.  Patient understood the risks, benefits and alternatives to surgery which include but are not limited to wound healing complications, infection, nonunion, malunion, need for further surgery as well as damage to surrounding structures. They also understood the potential for continued pain in that there were no guarantees of acceptable outcome After weighing these risks the patient opted to proceed with surgery.  PROCEDURE: Patient was identified in the preoperative holding area and the right hip was marked by myself.  The consent was signed by myself and the patient.  The patient was taken to the operating room where general endotracheal anesthesia was induced without difficulty and then was moved supine on a Hana table.  The operative leg was placed into the traction boot.  The non-operative leg was well-padded and affixed to the right leg holder with coban.  The arm was crossed over the chest and well padded.  Preoperative antibiotics was given.  Surgical timeout was performed.    Using fluoroscopy appropriate AP and lateral x-rays of the hip were obtained.  Then using traction through the Hana  table was able to reduce the fracture fragments in a closed fashion.  Acceptable reduction was confirmed on AP and lateral x-rays.  Then the right hip was prepped and draped in usual sterile fashion.  A shower curtain drape was placed.  We began the surgery by placing the guidepin percutaneously at the appropriate starting point at the tip of the greater trochanter.  The appropriate starting point was confirmed on AP and lateral radiograph.  Then a 3 cm incision was made about the site of the percutaneous placement of the pin and the opening reamer with a soft tissue guide was placed into the wound down to the tip of the greater trochanter and the bone was entered with the opening reamer down to the level of lesser trochanter.  Then the openning reamer and guide pin were removed and a 1short nail was placed.  The implant placed without difficulty and appropriate positioning was confirmed on fluoroscopy.  Then a stab incision was made for guidepin placement for the cephalomedullary screw.  Guidepin was placed in the appropriate position and the length was measured.  Care was taken to place the pin and the acceptable position within the femoral head.  Then cannulated drill was used to drill for the screw placement.  Screw was placed without difficulty.  The screw was then locked within the nail.  Appropriate position was confirmed fluoroscopically.  Then the distal interlock screw was placed without difficulty.  Final films were taken.  The wounds were irrigated with normal saline and the incisions were closed in a layered fashion using 2-0 monocryl and staples.  Mepilex dressings were used.  Patient was then transferred to the hospital bed and awakened from anesthesia without difficulty.  Counts were correct at the  end the case.  There were no complications.  POST OPERATIVE INSTRUCTIONS: WBAT operative extremity Patient will be discharged on 325 mg aspirin for DVT prophylaxis if not already on DVT prophylaxis  at baseline Follow-up in 2 weeks for x-rays of the operative hip and suture removal if appropriate    BLOOD LOSS:  200 mL         DRAINS: none         SPECIMEN: none       COMPLICATIONS:  * No complications entered in OR log *         Disposition: PACU - hemodynamically stable.         Condition: stable

## 2023-02-01 NOTE — ED Notes (Signed)
Pharmacy at bedside

## 2023-02-02 ENCOUNTER — Encounter (HOSPITAL_COMMUNITY): Payer: Self-pay | Admitting: Orthopaedic Surgery

## 2023-02-02 DIAGNOSIS — S72001D Fracture of unspecified part of neck of right femur, subsequent encounter for closed fracture with routine healing: Secondary | ICD-10-CM

## 2023-02-02 LAB — BASIC METABOLIC PANEL
Anion gap: 12 (ref 5–15)
BUN: 38 mg/dL — ABNORMAL HIGH (ref 8–23)
CO2: 20 mmol/L — ABNORMAL LOW (ref 22–32)
Calcium: 7.9 mg/dL — ABNORMAL LOW (ref 8.9–10.3)
Chloride: 100 mmol/L (ref 98–111)
Creatinine, Ser: 1.76 mg/dL — ABNORMAL HIGH (ref 0.44–1.00)
GFR, Estimated: 29 mL/min — ABNORMAL LOW (ref >60)
Glucose, Bld: 149 mg/dL — ABNORMAL HIGH (ref 70–99)
Potassium: 3.6 mmol/L (ref 3.5–5.1)
Sodium: 132 mmol/L — ABNORMAL LOW (ref 135–145)

## 2023-02-02 LAB — CBC
HCT: 17.2 % — ABNORMAL LOW (ref 36.0–46.0)
HCT: 28.9 % — ABNORMAL LOW (ref 36.0–46.0)
Hemoglobin: 5.3 g/dL — CL (ref 12.0–15.0)
Hemoglobin: 9.4 g/dL — ABNORMAL LOW (ref 12.0–15.0)
MCH: 25.5 pg — ABNORMAL LOW (ref 26.0–34.0)
MCH: 27.2 pg (ref 26.0–34.0)
MCHC: 30.8 g/dL (ref 30.0–36.0)
MCHC: 32.5 g/dL (ref 30.0–36.0)
MCV: 82.7 fL (ref 80.0–100.0)
MCV: 83.5 fL (ref 80.0–100.0)
Platelets: 299 10*3/uL (ref 150–400)
Platelets: 267 10*3/uL (ref 150–400)
RBC: 2.08 MIL/uL — ABNORMAL LOW (ref 3.87–5.11)
RBC: 3.46 MIL/uL — ABNORMAL LOW (ref 3.87–5.11)
RDW: 20 % — ABNORMAL HIGH (ref 11.5–15.5)
RDW: 18 % — ABNORMAL HIGH (ref 11.5–15.5)
WBC: 14.3 10*3/uL — ABNORMAL HIGH (ref 4.0–10.5)
WBC: 15.8 10*3/uL — ABNORMAL HIGH (ref 4.0–10.5)
nRBC: 0 % (ref 0.0–0.2)
nRBC: 0.2 % (ref 0.0–0.2)

## 2023-02-02 LAB — PREPARE RBC (CROSSMATCH)

## 2023-02-02 LAB — MAGNESIUM: Magnesium: 2 mg/dL (ref 1.7–2.4)

## 2023-02-02 MED ORDER — SODIUM CHLORIDE 0.9% IV SOLUTION: Freq: Once | INTRAVENOUS | Status: AC

## 2023-02-02 MED ORDER — ENSURE ENLIVE PO LIQD
237.0000 mL | Freq: Two times a day (BID) | ORAL | Status: DC
Start: 2023-02-02 — End: 2023-02-06
  Administered 2023-02-02 – 2023-02-03 (×2): 237 mL via ORAL

## 2023-02-02 MED ORDER — FUROSEMIDE 10 MG/ML IJ SOLN
40.0000 mg | Freq: Two times a day (BID) | INTRAMUSCULAR | Status: AC
  Administered 2023-02-02 (×2): 40 mg via INTRAVENOUS
  Filled 2023-02-02 (×2): qty 4

## 2023-02-02 MED ORDER — ADULT MULTIVITAMIN W/MINERALS CH
1.0000 | ORAL_TABLET | Freq: Every day | ORAL | Status: DC
  Administered 2023-02-02 – 2023-02-05 (×4): 1 via ORAL
  Filled 2023-02-02 (×4): qty 1

## 2023-02-02 NOTE — Progress Notes (Signed)
     Laurie James is a 77 y.o. female   Orthopaedic diagnosis: Status post nailing of right hip fracture 02/01/2023  Subjective: Patient seen and evaluated this morning.  She only reports pain in the right hip with attempted motion.  Otherwise pain is well controlled on current regimen.  Her hemoglobin was found to be 5.3 early this morning. She is receiving 2 units of packed red blood cells. She has not noticed any bleeding from the surgical site. It appears her Hemoglobin is around 7-8 at baseline and is a chronic issue.   Objectyive: Vitals:   02/02/23 1019 02/02/23 1020  BP: (!) 114/57 (!) 114/57  Pulse: 60 60  Resp: (!) 21 (!) 21  Temp: 98.3 F (36.8 C) 98.3 F (36.8 C)  SpO2:  99%     Exam: Awake and alert Respirations even and unlabored No acute distress  Examination of the right lower extremity shows clean, dry, and intact surgical dressing.  The dressing was not removed.  The right lower extremity is well aligned clinically.  She is able to wiggle her toes and range of motion at the ankle is intact.  Neurovascular intact distally.   Assessment: Postop day 1 status post the above, doing well Expected acute blood loss anemia, receiving blood transfusion this morning for hemoglobin of 5.3   Plan: Patient doing well. Pain is controlled.  We will monitor for improvement in her hemoglobin.  No obvious bleeding from the surgical site.  This is expected as her hemoglobin is quite low at baseline.  She is suitable to resume her baseline anticoagulation once hemoglobin improves and stabilizes, and she is cleared medically to resume it. This will also serve for DVT prophylaxis postoperatively.  Pain control as needed.  Weightbearing as tolerated with a walker. She will work with therapies.  Disposition Per primary team.  We will plan to see the patient outpatient 2 weeks from surgery at [facility] orthopedics with Dr. Susa Simmonds for radiographs of the operative hip, wound  check, and likely staple removal.   Laurie Morell J. [place], PA-C

## 2023-02-02 NOTE — Progress Notes (Signed)
Pts skin is very fragile. While using the chair steady to help pt to the bed side commode, pt developed two skin tear. One to her L arm by her IV and the other on the left leg. Xeroform and gauze placed on both. Will cont to monitor and assess.

## 2023-02-02 NOTE — Progress Notes (Signed)
PROGRESS NOTE    Laurie James  WUJ:[id number] DOB: [birth year] DOA: 01/31/2023 PCP: Georgann Housekeeper, MD  77/F w diastolic CHF, severe TR, recurrent right pleural effusion, paroxysmal A-fib on Eliquis, CKD stage IIIa, hyperlipidemia, hypothyroidism, age-related macular degeneration/blindness, coronary artery disease, COPD, GERD who presented from home with complaint of right hip pain after fall.  She was discharged home on 10/22 after she was admitted for CHF exacerbation . While at home, she lost her balance and fell on her right hip.  X-ray of the right hip/pelvis showed acute commuted right femoral intratrochanteric fracture.  Chest x-ray showed moderate right pleural effusion, small left pleural effusion, cardiomegaly with unchanged interstitial opacities.  Orthopedics consulted,  -Underwent ORIF 10/24 -10/25, hemoglobin down to 5.3, transfusing 2 units of PRBC   Subjective: Feels fair, denies any dyspnea, some pain at the operative site  Assessment and Plan:  Acute comminuted right femoral intertrochanteric fracture:  -Following mechanical fall.  Orthopedics consulted,  -Underwent ORIF 10/24 -Pain control, restart Eliquis tomorrow -PT OT eval, may need rehab   Chronic diastolic CHF/severe TR: - Echo 7/24 showed EF of 55 to 60%, grade 2 diastolic dysfunction, severe TR.  Patient was recently admitted for diastolic CHF exacerbation and was discharged . -Moderate right pleural effusion noted on x-ray 10/23 but does not appear overtly volume overloaded -IV Lasix between blood transfusions today   Leukocytosis: Currently most likely reactive.  No signs of infection   Acute blood loss anemia In the background of chronic normocytic anemia -Baseline hemoglobin around 7.5-8, was 7.2 on admission yesterday, now 5.3 after surgery -Transfusing 2 units of PRBC, holding Eliquis today -Repeat CBC this afternoon and in a.m.   CKD stage IIIa: Currently creatinine at baseline.    Coronary artery disease:  -Continue statin, Eliquis on hold   Paroxysmal A-fib: Currently in normal sinus rhythm.   -Continue amiodarone, Eliquis on hold    DVT prophylaxis: Code Status:  Family Communication: Disposition Plan:   Consultants:    Procedures:   Antimicrobials:    Objective: Vitals:   02/02/23 0833 02/02/23 0957 02/02/23 1019 02/02/23 1020  BP: 106/65 (!) 99/55 (!) 114/57 (!) 114/57  Pulse: (!) 57 (!) 57 60 60  Resp: 19 19 (!) 21 (!) 21  Temp: 98.2 F (36.8 C) 98.3 F (36.8 C) 98.3 F (36.8 C) 98.3 F (36.8 C)  TempSrc: Oral Oral  Oral  SpO2: 96% 97%  99%  Weight:      Height:        Intake/Output Summary (Last 24 hours) at 02/02/2023 1056 Last data filed at 02/02/2023 9562 Gross per 24 hour  Intake 1178.75 ml  Output 25 ml  Net 1153.75 ml   Filed Weights   01/31/23 1932 02/01/23 1939  Weight: 49 kg 52.2 kg    Examination:  General exam: Chronically ill female sitting up in bed, AAOx3 HEENT: + JVD CVS: S1-S2, regular rhythm Lungs: Decreased breath sounds to bases Abdomen: Soft, nontender, bowel sounds present Right lower extremity with extensive dressing, no edema in left leg Psychiatry:  Mood & affect appropriate.     Data Reviewed:   CBC: Recent Labs  Lab 01/30/23 1743 01/31/23 0749 01/31/23 2020 02/01/23 0430 02/02/23 0339  WBC 8.7 7.9 12.7* 10.9* 14.3*  NEUTROABS  --   --  11.6*  --   --   HGB 8.4* 8.2* 8.1* 7.2* 5.3*  HCT 28.4* 26.8* 26.2* 23.6* 17.2*  MCV 88.2 83.5 83.4 83.4 82.7  PLT 372 345  374 332 299   Basic Metabolic Panel: Recent Labs  Lab 01/30/23 1743 01/31/23 0749 01/31/23 2020 02/01/23 0835  NA 135 136 135 135  K 4.2 3.7 4.2 4.8  CL 108 103 100 102  CO2 20* 22 22 20*  GLUCOSE 98 100* 120* 102*  BUN 24* 23 27* 32*  CREATININE 1.15* 1.26* 1.39* 1.54*  CALCIUM 8.4* 8.3* 8.6* 8.3*  MG  --  1.9  --   --    GFR: Estimated Creatinine Clearance: 20.6 mL/min (A) (by C-G formula based on SCr of 1.54  mg/dL (H)). Liver Function Tests: Recent Labs  Lab 01/30/23 1743 02/01/23 0835  AST 95* 83*  ALT 84* 80*  ALKPHOS 79 72  BILITOT 1.1 1.2  PROT 7.3 6.9  ALBUMIN 2.8* 2.6*   No results for input(s): "LIPASE", "AMYLASE" in the last 168 hours. No results for input(s): "AMMONIA" in the last 168 hours. Coagulation Profile: Recent Labs  Lab 01/31/23 2034  INR 1.8*   Cardiac Enzymes: No results for input(s): "CKTOTAL", "CKMB", "CKMBINDEX", "TROPONINI" in the last 168 hours. BNP (last 3 results) No results for input(s): "PROBNP" in the last 8760 hours. HbA1C: No results for input(s): "HGBA1C" in the last 72 hours. CBG: No results for input(s): "GLUCAP" in the last 168 hours. Lipid Profile: No results for input(s): "CHOL", "HDL", "LDLCALC", "TRIG", "CHOLHDL", "LDLDIRECT" in the last 72 hours. Thyroid Function Tests: Recent Labs    01/30/23 1743  TSH 6.653*   Anemia Panel: No results for input(s): "VITAMINB12", "FOLATE", "FERRITIN", "TIBC", "IRON", "RETICCTPCT" in the last 72 hours. Urine analysis:    Component Value Date/Time   COLORURINE YELLOW 05/01/2022 1922   APPEARANCEUR HAZY (A) 05/01/2022 1922   LABSPEC 1.029 05/01/2022 1922   PHURINE 5.0 05/01/2022 1922   GLUCOSEU 50 (A) 05/01/2022 1922   HGBUR SMALL (A) 05/01/2022 1922   BILIRUBINUR NEGATIVE 05/01/2022 1922   KETONESUR NEGATIVE 05/01/2022 1922   PROTEINUR 100 (A) 05/01/2022 1922   NITRITE NEGATIVE 05/01/2022 1922   LEUKOCYTESUR NEGATIVE 05/01/2022 1922   Sepsis Labs: @LABRCNTIP (procalcitonin:4,lacticidven:4)  ) Recent Results (from the past 240 hour(s))  Resp panel by RT-PCR (RSV, Flu A&B, Covid) Anterior Nasal Swab     Status: None   Collection Time: 01/30/23  5:43 PM   Specimen: Anterior Nasal Swab  Result Value Ref Range Status   SARS Coronavirus 2 by RT PCR NEGATIVE NEGATIVE Final   Influenza A by PCR NEGATIVE NEGATIVE Final   Influenza B by PCR NEGATIVE NEGATIVE Final    Comment: (NOTE) The Xpert  Xpress SARS-CoV-2/FLU/RSV plus assay is intended as an aid in the diagnosis of influenza from Nasopharyngeal swab specimens and should not be used as a sole basis for treatment. Nasal washings and aspirates are unacceptable for Xpert Xpress SARS-CoV-2/FLU/RSV testing.  Fact Sheet for Patients: BloggerCourse.com  Fact Sheet for Healthcare Providers: SeriousBroker.it  This test is not yet approved or cleared by the [place] FDA and has been authorized for detection and/or diagnosis of SARS-CoV-2 by FDA under an Emergency Use Authorization (EUA). This EUA will remain in effect (meaning this test can be used) for the duration of the COVID-19 declaration under Section 564(b)(1) of the Act, 21 U.S.C. section 360bbb-3(b)(1), unless the authorization is terminated or revoked.     Resp Syncytial Virus by PCR NEGATIVE NEGATIVE Final    Comment: (NOTE) Fact Sheet for Patients: BloggerCourse.com  Fact Sheet for Healthcare Providers: SeriousBroker.it  This test is not yet approved or cleared by the [place] FDA and has been authorized for detection and/or diagnosis of SARS-CoV-2 by FDA under an Emergency Use Authorization (EUA). This EUA will remain in effect (meaning this test can be used) for the duration of the COVID-19 declaration under Section 564(b)(1) of the Act, 21 U.S.C. section 360bbb-3(b)(1), unless the authorization is terminated or revoked.  Performed at [facility], [address] [place], [place] [address]   Surgical pcr screen     Status: None   Collection Time: 02/01/23  8:36 AM   Specimen: Nasal Mucosa; Nasal Swab  Result Value Ref Range Status   MRSA, PCR NEGATIVE NEGATIVE Final   Staphylococcus aureus NEGATIVE NEGATIVE Final    Comment: (NOTE) The Xpert SA Assay (FDA approved for NASAL specimens in patients 61 years of age and older), is one  component of a comprehensive surveillance program. It is not intended to diagnose infection nor to guide or monitor treatment. Performed at [facility], [address] [place], [place] [address]      Radiology Studies: DG FEMUR, MIN 2 VIEWS RIGHT  Result Date: 02/01/2023 CLINICAL DATA:  Known right intratrochanteric femoral fracture EXAM: RIGHT FEMUR 2 VIEWS COMPARISON:  Films from the previous day. FLUOROSCOPY TIME:  Radiation Exposure Index (as provided by the fluoroscopic device): 14.13 mGy If the device does not provide the exposure index: Fluoroscopy Time:  83 seconds Number of Acquired Images:  4 FINDINGS: Proximal medullary rod with fixation screw traversing the femoral neck is seen. Distal fixation screw is noted as well. Fracture fragments are in near anatomic alignment. IMPRESSION: ORIF of right femoral fracture. Electronically Signed   By: Alcide Clever M.D.   On: 02/01/2023 17:07   DG C-Arm 1-60 Min-No Report  Result Date: 02/01/2023 Fluoroscopy was utilized by the requesting physician.  No radiographic interpretation.   DG Hip Unilat With Pelvis 2-3 Views Right  Result Date: 01/31/2023 CLINICAL DATA:  Fall EXAM: DG HIP (WITH OR WITHOUT PELVIS) 2-3V RIGHT COMPARISON:  None Available. FINDINGS: There is an acute comminuted right femoral intratrochanteric fracture. There is 1 shaft with superolateral displacement of the distal fracture fragment. There is no dislocation. IMPRESSION: Acute comminuted right femoral intratrochanteric fracture. Electronically Signed   By: Darliss Cheney M.D.   On: 01/31/2023 21:42   DG Chest Port 1 View  Result Date: 01/31/2023 CLINICAL DATA:  Fall EXAM: PORTABLE CHEST 1 VIEW COMPARISON:  Chest x-ray 01/30/2023 FINDINGS: Moderate right effusion is stable. Small left pleural effusion has decreased from prior. The heart is enlarged. There some interstitial opacities in the lower lungs, unchanged. Biapical pleural-parenchymal scarring again noted.  No pneumothorax or acute fracture. Osseous structures are stable. IMPRESSION: 1. Moderate right pleural effusion is stable. Small left pleural effusion has decreased from prior. 2. Cardiomegaly with interstitial opacities in the lower lungs, unchanged. Electronically Signed   By: Darliss Cheney M.D.   On: 01/31/2023 21:41     Scheduled Meds:  amiodarone  200 mg Oral BID   Followed by   Melene Muller ON 02/09/2023] amiodarone  200 mg Oral Daily   atorvastatin  40 mg Oral Daily   furosemide  40 mg Intravenous BID   levothyroxine  75 mcg Oral QAC breakfast   mometasone-formoterol  2 puff Inhalation BID   pantoprazole  40 mg Oral BID   polyethylene glycol  17 g Oral Daily   senna-docusate  1 tablet Oral BID   Continuous Infusions:   LOS: 2 days    Time spent:  Zannie Cove, MD [facility]   02/02/2023, 10:56 AM

## 2023-02-02 NOTE — Evaluation (Signed)
Physical Therapy Evaluation Patient Details Name: Laurie James MRN: [id number] DOB: 05/14/1945 Today's Date: 02/02/2023  History of Present Illness  Pt is a 77 y.o. female presenting to [facility] 01/31/23 on the same day as being d/c from the hospital for falling out of the car onto R hip. Pt s/p R IM nail for intertrochanteric hip fx. Pt was admitted 01/30/23 with acute exacerbation of CHF and recurrent R pleural effusion. PMH significant for blindness, diastolic CHF, severe TR, PAF on Eliquis, CAD, COPD, CKD-3 and HTN   Clinical Impression  Pt in bed upon arrival and agreeable to PT eval. Pt with recent admit where pt was ambulating with 1HH and CGA. Prior to hospital admissions, pt was independent with short distance ambulation while holding onto surfaces. In today's session, pt required ModAx2 for bed mobility due to R LE pain. Pt was able to stand with ModAx2 with a RW and maintain balance for a few minutes. Performed stand pivot transfer to the left with ModAx2 and pt holding onto therapists posterior elbow. Pt presents to therapy session with decreased LE strength and ROM, balance, and mobility. Pt would benefit from acute skilled PT to address functional impairments. Recommending post-acute rehab <3 hrs to work towards previous level of independence. Acute PT to follow.          02/02/23 1455  Orthostatic Lying   BP- Lying 104/58  Pulse- Lying 65  Orthostatic Sitting  BP- Sitting 96/54  Pulse- Sitting 67  Orthostatic Standing at 0 minutes  BP- Standing at 0 minutes 96/56  Pulse- Standing at 0 minutes 68    If plan is discharge home, recommend the following: A lot of help with walking and/or transfers;A lot of help with bathing/dressing/bathroom;Help with stairs or ramp for entrance   Can travel by private vehicle   No    Equipment Recommendations None recommended by PT  Recommendations for Other Services       Functional Status Assessment Patient has had a recent  decline in their functional status and demonstrates the ability to make significant improvements in function in a reasonable and predictable amount of time.     Precautions / Restrictions Precautions Precautions: Fall Restrictions Weight Bearing Restrictions: Yes RLE Weight Bearing: Weight bearing as tolerated      Mobility  Bed Mobility Overal bed mobility: Needs Assistance Bed Mobility: Supine to Sit     Supine to sit: Mod assist, +2 for physical assistance, HOB elevated, Used rails     General bed mobility comments: needed assist to move R LE towards EOB, assist for trunk elevation. Used bed pads to shift hips forward. Cues for pt to reach for rail to pull self up.    Transfers Overall transfer level: Needs assistance Equipment used: 2 person hand held assist, Rolling walker (2 wheels) Transfers: Sit to/from Stand, Bed to chair/wheelchair/BSC Sit to Stand: Mod assist, +2 physical assistance Stand pivot transfers: Mod assist, +2 physical assistance         General transfer comment: ModAx2 for boost up, intial rise, and steadying. Initial stand with RW, pt fatigued after taking BP and lowered to EOB. Transfered w/ stand pivot to the L with pt holding onto posterior elbows with ModAx2. Pt attempted to lower before reaching the recliner and required shift at the hips to complete transfer    Ambulation/Gait        General Gait Details: pt unable to take steps at this time  Balance Overall balance assessment: Needs assistance, Mild deficits observed, not formally tested Sitting-balance support: No upper extremity supported, Feet supported Sitting balance-Leahy Scale: Good     Standing balance support: Bilateral upper extremity supported, During functional activity, Reliant on assistive device for balance Standing balance-Leahy Scale: Poor Standing balance comment: reliant on UE support         Pertinent Vitals/Pain Pain Assessment Pain Assessment:  Faces Faces Pain Scale: Hurts even more Pain Location: R LE Pain Descriptors / Indicators: Aching, Discomfort Pain Intervention(s): Limited activity within patient's tolerance, Monitored during session, Repositioned    Home Living Family/patient expects to be discharged to:: Private residence Living Arrangements: Spouse/significant other Available Help at Discharge: Family;Available 24 hours/day (daughter and son live nearby) Type of Home: House Home Access: Stairs to enter Entrance Stairs-Rails: None Entrance Stairs-Number of Steps: 3 Alternate Level Stairs-Number of Steps: flight Home Layout: Two level;Able to live on main level with bedroom/bathroom Home Equipment: Rolling Walker (2 wheels);Cane - single point;Shower seat;Wheelchair - manual;Grab bars - tub/shower;Hospital bed Additional Comments: lives with her husband, daughter makes meals and brings them over and cleans house and son lives nearby    Prior Function Prior Level of Function : Needs assist       Physical Assist : Mobility (physical);ADLs (physical) Mobility (physical): Gait;Stairs ADLs (physical): Bathing;Dressing Mobility Comments: reports ind, household distances while furniture surfing. Family assist for mobility out of the house due to vision deficits and balance. Pt did not use AD ADLs Comments: Husband assists with shower transfers and occasionally with dressing. Husband cooks/cleans but pt does laundry and dishes     Extremity/Trunk Assessment   Upper Extremity Assessment Upper Extremity Assessment: Defer to OT evaluation    Lower Extremity Assessment Lower Extremity Assessment: Generalized weakness;RLE deficits/detail RLE Deficits / Details: limited knee ext. ROM and ankle DF ROM due to pain, at least 3/5 MMT as pt was able to bear weight on R LE. Alert to light touch bilaterally    Cervical / Trunk Assessment Cervical / Trunk Assessment: Kyphotic  Communication   Communication Communication: No  apparent difficulties  Cognition Arousal: Alert Behavior During Therapy: WFL for tasks assessed/performed Overall Cognitive Status: Within Functional Limits for tasks assessed       General Comments General comments (skin integrity, edema, etc.): VSS on RA, see BP readings        Assessment/Plan    PT Assessment Patient needs continued PT services  PT Problem List Decreased strength;Decreased activity tolerance;Decreased balance;Decreased mobility;Cardiopulmonary status limiting activity       PT Treatment Interventions DME instruction;Stair training;Gait training;Therapeutic activities;Functional mobility training;Therapeutic exercise;Balance training;Neuromuscular re-education;Patient/family education    PT Goals (Current goals can be found in the Care Plan section)  Acute Rehab PT Goals Patient Stated Goal: to get better PT Goal Formulation: With patient Time For Goal Achievement: 02/16/23 Potential to Achieve Goals: Good    Frequency Min 1X/week     Co-evaluation PT/OT/SLP Co-Evaluation/Treatment: Yes Reason for Co-Treatment: For patient/therapist safety;To address functional/ADL transfers PT goals addressed during session: Mobility/safety with mobility;Balance;Proper use of DME         AM-PAC PT "6 Clicks" Mobility  Outcome Measure Help needed turning from your back to your side while in a flat bed without using bedrails?: A Lot Help needed moving from lying on your back to sitting on the side of a flat bed without using bedrails?: A Lot Help needed moving to and from a bed to a chair (including a wheelchair)?: Total Help needed  standing up from a chair using your arms (e.g., wheelchair or bedside chair)?: Total Help needed to walk in hospital room?: Total Help needed climbing 3-5 steps with a railing? : Total 6 Click Score: 8    End of Session Equipment Utilized During Treatment: Gait belt Activity Tolerance: Patient limited by pain Patient left: in  chair;with call bell/phone within reach;with chair alarm set Nurse Communication: Mobility status (how to transfer pt back) PT Visit Diagnosis: Unsteadiness on feet (R26.81);Muscle weakness (generalized) (M62.81)    Time: 1610-9604 PT Time Calculation (min) (ACUTE ONLY): 45 min   Charges:   PT Evaluation $PT Eval Low Complexity: 1 Low PT Treatments $Therapeutic Activity: 8-22 mins PT General Charges $$ ACUTE PT VISIT: 1 Visit        Hilton Cork, PT, DPT Secure Chat Preferred  Rehab Office [phone]   Arturo Morton Brion Aliment 02/02/2023, 3:44 PM

## 2023-02-02 NOTE — Evaluation (Signed)
Occupational Therapy Evaluation Patient Details Name: Laurie James MRN: [id number] DOB: 1945/07/30 Today's Date: 02/02/2023   History of Present Illness Pt is a 77 y.o. female presenting to [facility] 01/31/23 on the same day as being d/c from the hospital for falling out of the car onto R hip. Pt s/p R IM nail for intertrochanteric hip fx. Pt was admitted 01/30/23 with acute exacerbation of CHF and recurrent R pleural effusion. PMH significant for blindness, diastolic CHF, severe TR, PAF on Eliquis, CAD, COPD, CKD-3 and HTN   Clinical Impression   At baseline, pt requires assistance from husband for bathing and occasional assistance with dressing. At baseline, pt performs functional mobility in the home Independent to Mod I and requires assistance of family in the community due to baseline visual deficits. Pt now presents with pain affecting functional level, decreased B UE strength, decreased activity tolerance, and decreased balance during functional tasks. Pt is currently at or near baseline PLOF for UB ADLs but with decline in safety and independence with LB ADLs and functional transfers/mobility. Pt currently demonstrates ability to complete UB ADLs largely with Set up, LB ADLs with Mod +2 to Total assist +2, and functional transfers/mobility with Mod assist +2. Pt will benefit from acute skilled OT services to address deficits outlined below, decrease caregiver burden, and increase safety and independence with functional tasks. Post acute discharge, pt will benefit from intensive inpatient skilled rehab services < 3 hours per day to maximize rehab potential.      If plan is discharge home, recommend the following: Two people to help with walking and/or transfers;Two people to help with bathing/dressing/bathroom;Assistance with cooking/housework;Assist for transportation;Help with stairs or ramp for entrance    Functional Status Assessment  Patient has had a recent decline in their  functional status and demonstrates the ability to make significant improvements in function in a reasonable and predictable amount of time.  Equipment Recommendations  Other (comment) (defer to next level of care)    Recommendations for Other Services       Precautions / Restrictions Precautions Precautions: Fall Restrictions Weight Bearing Restrictions: Yes RLE Weight Bearing: Weight bearing as tolerated      Mobility Bed Mobility Overal bed mobility: Needs Assistance Bed Mobility: Supine to Sit     Supine to sit: Mod assist, +2 for physical assistance, HOB elevated, Used rails     General bed mobility comments: needed assist to move R LE towards EOB, assist for trunk elevation. Used bed pads to shift hips forward. Cues for pt to reach for rail to pull self up.    Transfers Overall transfer level: Needs assistance Equipment used: 2 person hand held assist, Rolling walker (2 wheels) Transfers: Sit to/from Stand, Bed to chair/wheelchair/BSC Sit to Stand: Mod assist, +2 physical assistance Stand pivot transfers: Mod assist, +2 physical assistance         General transfer comment: ModAx2 for boost up, intial rise, and steadying. Initial stand with RW, pt fatigued after taking BP and lowered to EOB. Transfered w/ stand pivot to the L with pt holding onto posterior elbows with ModAx2. Pt attempted to lower before reaching the recliner and required shift at the hips to complete transfer      Balance Overall balance assessment: Needs assistance, Mild deficits observed, not formally tested Sitting-balance support: No upper extremity supported, Feet supported Sitting balance-Leahy Scale: Good     Standing balance support: Bilateral upper extremity supported, During functional activity, Reliant on assistive device for balance Standing  balance-Leahy Scale: Poor Standing balance comment: reliant on UE support                           ADL either performed or assessed  with clinical judgement   ADL Overall ADL's : Needs assistance/impaired Eating/Feeding: Set up;Sitting Eating/Feeding Details (indicate cue type and reason): orientation to tray due to baseline vision impairments Grooming: Set up;Sitting Grooming Details (indicate cue type and reason): orientation to supplies due to baseline vision impairments Upper Body Bathing: Set up;Sitting   Lower Body Bathing: Maximal assistance;Bed level   Upper Body Dressing : Set up;Sitting   Lower Body Dressing: Total assistance;+2 for physical assistance;Sit to/from stand;Sitting/lateral leans   Toilet Transfer: Moderate assistance;+2 for physical assistance;BSC/3in1;Stand-pivot   Toileting- Clothing Manipulation and Hygiene: Total assistance;+2 for safety/equipment;Sit to/from stand         General ADL Comments: Pt at or near baseline PLOF for UB ADLs but with decline in safety and independence with LB ADLs, bed mobility, and functional trasnfers/mobility.     Vision Baseline Vision/History: 2 Legally blind Ability to See in Adequate Light: 4 Severely impaired Patient Visual Report: No change from baseline       Perception         Praxis         Pertinent Vitals/Pain Pain Assessment Pain Assessment: Faces Faces Pain Scale: Hurts even more Pain Location: R LE Pain Descriptors / Indicators: Aching, Discomfort, Grimacing, Guarding Pain Intervention(s): Limited activity within patient's tolerance, Monitored during session, Repositioned     Extremity/Trunk Assessment Upper Extremity Assessment Upper Extremity Assessment: Right hand dominant;Overall WFL for tasks assessed;RUE deficits/detail;LUE deficits/detail RUE Deficits / Details: general strength 4/5; ROM, sensation, and coordination WNL LUE Deficits / Details: general strength 4/5; ROM, sensation, and coordination WNL   Lower Extremity Assessment Lower Extremity Assessment: Defer to PT evaluation RLE Deficits / Details: limited knee  ext. ROM and ankle DF ROM due to pain, at least 3/5 MMT as pt was able to bear weight on R LE. Alert to light touch bilaterally   Cervical / Trunk Assessment Cervical / Trunk Assessment: Kyphotic   Communication Communication Communication: No apparent difficulties   Cognition Arousal: Alert Behavior During Therapy: WFL for tasks assessed/performed Overall Cognitive Status: Within Functional Limits for tasks assessed                                 General Comments: AAOx4 and pleasant throughout session. Pt demonstrates ability to follow 2 and 3 step instructions consistently.     General Comments  VSS on RA. Pt with stable hypotension (see orthostatic BP readings) throughout session. Pt with skin tear on L ankle, RN notified. RN present during a portion of session. Pt's daughter present during a portion of session.    Exercises     Shoulder Instructions      Home Living Family/patient expects to be discharged to:: Private residence Living Arrangements: Spouse/significant other Available Help at Discharge: Family;Available 24 hours/day (daughter and son live [place]) Type of Home: House Home Access: Stairs to enter [employer] of Steps: 3 Entrance Stairs-Rails: None Home Layout: Two level;Able to live on main level with bedroom/bathroom Alternate Level Stairs-Number of Steps: flight Alternate Level Stairs-Rails: Left Bathroom Shower/Tub: Producer, television/film/video: Standard Bathroom Accessibility: Yes How Accessible: Accessible via walker Home Equipment: Rolling Walker (2 wheels);Cane - single point;Shower seat;Wheelchair - manual;Grab  bars - tub/shower;Hospital bed   Additional Comments: lives with her husband, daughter makes meals and brings them over and cleans house and son lives nearby      Prior Functioning/Environment Prior Level of Function : Needs assist       Physical Assist : Mobility (physical);ADLs (physical) Mobility  (physical): Gait;Stairs ADLs (physical): Bathing;Dressing Mobility Comments: reports ind, household distances while furniture surfing. Family assist for mobility out of the house due to vision deficits and balance. Pt did not use AD ADLs Comments: Husband assists with shower transfers and occasionally with dressing. Husband cooks/cleans but pt does laundry and dishes        OT Problem List: Decreased strength;Decreased activity tolerance;Impaired balance (sitting and/or standing);Decreased knowledge of use of DME or AE;Pain      OT Treatment/Interventions: Self-care/ADL training;Therapeutic exercise;Energy conservation;DME and/or AE instruction;Therapeutic activities;Patient/family education;Balance training    OT Goals(Current goals can be found in the care plan section) Acute Rehab OT Goals Patient Stated Goal: to have less pain, heal well, and return home OT Goal Formulation: With patient Time For Goal Achievement: 02/16/23 Potential to Achieve Goals: Good ADL Goals Pt Will Perform Lower Body Bathing: with min assist;sitting/lateral leans Pt Will Perform Lower Body Dressing: with min assist;sitting/lateral leans;sit to/from stand Pt Will Transfer to Toilet: with contact guard assist;ambulating;bedside commode (with least restrictive AD) Pt Will Perform Toileting - Clothing Manipulation and hygiene: with min assist;sitting/lateral leans;sit to/from stand Pt/caregiver will Perform Home Exercise Program: Increased strength;Both right and left upper extremity;With theraband;With Supervision  OT Frequency: Min 1X/week    Co-evaluation PT/OT/SLP Co-Evaluation/Treatment: Yes Reason for Co-Treatment: For patient/therapist safety;To address functional/ADL transfers PT goals addressed during session: Mobility/safety with mobility;Balance;Proper use of DME OT goals addressed during session: ADL's and self-care;Proper use of Adaptive equipment and DME      AM-PAC OT "6 Clicks" Daily Activity      Outcome Measure Help from another person eating meals?: A Little Help from another person taking care of personal grooming?: A Little Help from another person toileting, which includes using toliet, bedpan, or urinal?: Total Help from another person bathing (including washing, rinsing, drying)?: A Lot Help from another person to put on and taking off regular upper body clothing?: A Little Help from another person to put on and taking off regular lower body clothing?: A Lot 6 Click Score: 14   End of Session Equipment Utilized During Treatment: Gait belt;Rolling walker (2 wheels) Nurse Communication: Mobility status;Other (comment) (skin tear on L ankle)  Activity Tolerance: Patient tolerated treatment well;Patient limited by pain Patient left: in chair;with call bell/phone within reach;with nursing/sitter in room  OT Visit Diagnosis: Unsteadiness on feet (R26.81);Other abnormalities of gait and mobility (R26.89);Pain                Time: 1446-1531 OT Time Calculation (min): 45 min Charges:  OT General Charges $OT Visit: 1 Visit OT Evaluation $OT Eval Low Complexity: 1 Low  Oskar Cretella "Kyle" M., OTR/L, MA Acute Rehab [phone]   Lendon Colonel 02/02/2023, 6:51 PM

## 2023-02-02 NOTE — Plan of Care (Signed)

## 2023-02-02 NOTE — Progress Notes (Signed)
Initial Nutrition Assessment  DOCUMENTATION CODES:   Non-severe (moderate) malnutrition in context of chronic illness  INTERVENTION:  Liberalize diet to regular Ensure Enlive po BID, each supplement provides 350 kcal and 20 grams of protein. Magic cup BID with meals, each supplement provides 290 kcal and 9 grams of protein MVI with minerals daily  NUTRITION DIAGNOSIS:   Moderate Malnutrition related to chronic illness (HF, pleural effusion, CKD IIIa, COPD) as evidenced by moderate fat depletion, moderate muscle depletion.  GOAL:   Patient will meet greater than or equal to 90% of their needs  MONITOR:   Labs, Diet advancement, PO intake, Weight trends  REASON FOR ASSESSMENT:   Consult Hip fracture protocol  ASSESSMENT:   Pt admitted from home with R hip pain. PMH significant for chronic HFpEF, severe tricuspid regurgitation, recurrent R pleural effusion, paroxysmal afib, CKD stage IIIa, HLD, hypothyroidism, CAD, COPD, HTN, hiatal hernia, GERD. Recently admitted 10/22-10/23 for acute CHF exacerbation   10/24 s/p cephalomedullary nail of R hip fracture  Pt familiar to RD from prior admission.   Pt sitting up in bed with family present at bedside during visit. Pt provided limited report of recent PO intake though reports that her appetite has been fair. She continues to try to eat as best as she can. Given edentulous status, she prefers to order meals to request items she feel she can best tolerate.   Pt is agreeable to continue nutrition supplements to promote adequate PO intake and healing.   Admission weight of 48.8 kg documented to be actual weight though uncertain whether this is bed versus standing scale. Today's bed weight is 52.2 kg which is +3.4 kg over night. Question accuracy of documented weights. Will continue to monitor throughout admission.   Medications: lasix 40mg  BID, protonix, miralax, senna  Labs: BUN 32, Cr 1.54, AST 83, ALT 80, hgb 5.3, GFR  35  NUTRITION - FOCUSED PHYSICAL EXAM:  Flowsheet Row Most Recent Value  Orbital Region Mild depletion  Upper Arm Region Moderate depletion  Thoracic and Lumbar Region Moderate depletion  Buccal Region No depletion  Temple Region Moderate depletion  Clavicle Bone Region Moderate depletion  Clavicle and Acromion Bone Region Moderate depletion  Scapular Bone Region Mild depletion  Dorsal Hand Moderate depletion  Patellar Region Severe depletion  [unable to assess R leg d/t bandage]  Anterior Thigh Region Severe depletion  Posterior Calf Region Mild depletion  Edema (RD Assessment) Mild  [BLE]  Hair Reviewed  Eyes Other (Comment)  [bilateral blindess]  Mouth Other (Comment)  [edentulous]  Skin Reviewed  Nails Reviewed       Diet Order:   Diet Order             Diet regular Room service appropriate? Yes with Assist; Fluid consistency: Thin  Diet effective now                   EDUCATION NEEDS:   Education needs have been addressed  Skin:  Skin Assessment: Reviewed RN Assessment (R hip closed incision)  Last BM:  10/21  Height:   Ht Readings from Last 1 Encounters:  02/01/23 4\' 8"  (1.422 m)    Weight:   Wt Readings from Last 1 Encounters:  02/01/23 52.2 kg   MI:  Body mass index is 25.8 kg/m.  Estimated Nutritional Needs:   Kcal:  1300-1500  Protein:  65-80g  Fluid:  1.3-1.5L  Drusilla Kanner, RDN, LDN Clinical Nutrition

## 2023-02-02 NOTE — Anesthesia Postprocedure Evaluation (Signed)
Anesthesia Post Note  Patient: [name]  Procedure(s) Performed: INTRAMEDULLARY (IM) NAIL INTERTROCHANTERIC (Right: Hip)     Patient location during evaluation: PACU Anesthesia Type: General Level of consciousness: awake and alert Pain management: pain level controlled Vital Signs Assessment: post-procedure vital signs reviewed and stable Respiratory status: spontaneous breathing, nonlabored ventilation, respiratory function stable and patient connected to nasal cannula oxygen Cardiovascular status: blood pressure returned to baseline and stable Postop Assessment: no apparent nausea or vomiting Anesthetic complications: no  No notable events documented.  Last Vitals:  Vitals:   02/02/23 1019 02/02/23 1020  BP: (!) 114/57 (!) 114/57  Pulse: 60 60  Resp: (!) 21 (!) 21  Temp: 36.8 C 36.8 C  SpO2:  99%    Last Pain:  Vitals:   02/02/23 1020  TempSrc: Oral  PainSc:    Pain Goal:                   Laurie James L Laurie James

## 2023-02-02 NOTE — Plan of Care (Signed)

## 2023-02-03 DIAGNOSIS — E440 Moderate protein-calorie malnutrition: Secondary | ICD-10-CM | POA: Insufficient documentation

## 2023-02-03 DIAGNOSIS — S72001D Fracture of unspecified part of neck of right femur, subsequent encounter for closed fracture with routine healing: Secondary | ICD-10-CM | POA: Diagnosis not present

## 2023-02-03 LAB — TYPE AND SCREEN
ABO/RH(D): O NEG
Antibody Screen: NEGATIVE
Unit division: 0
Unit division: 0

## 2023-02-03 LAB — BASIC METABOLIC PANEL
Anion gap: 10 (ref 5–15)
BUN: 37 mg/dL — ABNORMAL HIGH (ref 8–23)
CO2: 24 mmol/L (ref 22–32)
Calcium: 7.8 mg/dL — ABNORMAL LOW (ref 8.9–10.3)
Chloride: 100 mmol/L (ref 98–111)
Creatinine, Ser: 1.41 mg/dL — ABNORMAL HIGH (ref 0.44–1.00)
GFR, Estimated: 38 mL/min — ABNORMAL LOW (ref >60)
Glucose, Bld: 104 mg/dL — ABNORMAL HIGH (ref 70–99)
Potassium: 3.6 mmol/L (ref 3.5–5.1)
Sodium: 134 mmol/L — ABNORMAL LOW (ref 135–145)

## 2023-02-03 LAB — CBC
HCT: 26.8 % — ABNORMAL LOW (ref 36.0–46.0)
Hemoglobin: 9.1 g/dL — ABNORMAL LOW (ref 12.0–15.0)
MCH: 27.1 pg (ref 26.0–34.0)
MCHC: 34 g/dL (ref 30.0–36.0)
MCV: 79.8 fL — ABNORMAL LOW (ref 80.0–100.0)
Platelets: 231 10*3/uL (ref 150–400)
RBC: 3.36 MIL/uL — ABNORMAL LOW (ref 3.87–5.11)
RDW: 18 % — ABNORMAL HIGH (ref 11.5–15.5)
WBC: 14.3 10*3/uL — ABNORMAL HIGH (ref 4.0–10.5)
nRBC: 0.2 % (ref 0.0–0.2)

## 2023-02-03 LAB — BPAM RBC
Blood Product Expiration Date: 202411012359
Blood Product Expiration Date: 202411062359
ISSUE DATE / TIME: 202410250515
ISSUE DATE / TIME: 202410250949
Unit Type and Rh: 9500
Unit Type and Rh: 9500

## 2023-02-03 MED ORDER — FUROSEMIDE 40 MG PO TABS
40.0000 mg | ORAL_TABLET | Freq: Every day | ORAL | Status: DC
  Administered 2023-02-03 – 2023-02-05 (×3): 40 mg via ORAL
  Filled 2023-02-03 (×3): qty 1

## 2023-02-03 MED ORDER — POTASSIUM CHLORIDE CRYS ER 20 MEQ PO TBCR
20.0000 meq | EXTENDED_RELEASE_TABLET | Freq: Every day | ORAL | Status: DC
Start: 2023-02-03 — End: 2023-02-06
  Administered 2023-02-03 – 2023-02-05 (×3): 20 meq via ORAL
  Filled 2023-02-03 (×3): qty 1

## 2023-02-03 MED ORDER — FUROSEMIDE 20 MG PO TABS: 20.0000 mg | ORAL_TABLET | Freq: Every day | ORAL | Status: DC

## 2023-02-03 MED ORDER — APIXABAN 5 MG PO TABS
5.0000 mg | ORAL_TABLET | Freq: Two times a day (BID) | ORAL | Status: DC
  Administered 2023-02-03 – 2023-02-05 (×5): 5 mg via ORAL
  Filled 2023-02-03 (×5): qty 1

## 2023-02-03 NOTE — Progress Notes (Signed)
PATIENT ID: Jezreel Veal  MRN: [id number]  DOB/AGE:  1946/01/09 / 77 y.o.  2 Days Post-Op Procedure(s) (LRB): INTRAMEDULLARY (IM) NAIL INTERTROCHANTERIC (Right)  Subjective: Patient reports that she is feeling much better today than yesterday. She states that she is having some pain in the right hip but it is minimal at rest.   Objective: Vital signs in last 24 hours: Temp:  [98 F (36.7 C)-98.9 F (37.2 C)] 98.1 F (36.7 C) (10/26 1518) Pulse Rate:  [61-68] 61 (10/26 1518) Resp:  [15-21] 17 (10/26 1518) BP: (95-120)/(54-61) 95/58 (10/26 1518) SpO2:  [93 %-98 %] 96 % (10/26 1518) Weight:  [51.2 kg] 51.2 kg (10/26 0523)  Intake/Output from previous day: 10/25 0701 - 10/26 0700 In: 1240 [P.O.:240; Blood:1000] Out: 1850 [Urine:1850] Intake/Output this shift: Total I/O In: -  Out: 1400 [Urine:1400]  Recent Labs    01/31/23 2020 02/01/23 0430 02/02/23 0339 02/02/23 1521 02/03/23 0253  HGB 8.1* 7.2* 5.3* 9.4* 9.1*   Recent Labs    02/02/23 1521 02/03/23 0253  WBC 15.8* 14.3*  RBC 3.46* 3.36*  HCT 28.9* 26.8*  PLT 267 231   Recent Labs    02/02/23 1521 02/03/23 0253  NA 132* 134*  K 3.6 3.6  CL 100 100  CO2 20* 24  BUN 38* 37*  CREATININE 1.76* 1.41*  GLUCOSE 149* 104*  CALCIUM 7.9* 7.8*   Recent Labs    01/31/23 2034  INR 1.8*    Physical Exam: Neurologically intact Sensation intact distally Intact pulses distally Dorsiflexion/Plantar flexion intact Incision: dressing C/D/I No cellulitis present Compartment soft  Assessment/Plan: 2 Days Post-Op Procedure(s) (LRB): INTRAMEDULLARY (IM) NAIL INTERTROCHANTERIC (Right)   Assessment: Postop day 2 status post the above, doing well Expected acute blood loss anemia, Hgb up to 9.1 today   Weightbearing as tolerated with a walker. She will work with therapies. Disposition Per primary team.   We will plan to see the patient outpatient 2 weeks from surgery at [facility] orthopedics with Dr.  Susa Simmonds for radiographs of the operative hip, wound check, and likely staple removal.   Cristabel Bicknell L. [place], PA-C 02/03/2023, 3:24 PM

## 2023-02-03 NOTE — [place] FL2 (Signed)
[place] MEDICAID FL2 LEVEL OF CARE FORM     IDENTIFICATION  Patient Name: Laurie James Birthdate: Sep 16, 1945 Sex: female Admission Date (Current Location): 01/31/2023  [facility] and [place] Number:  [employer] and Address:  The [place], [address] [place], [place] [address]      Provider Number: [id number]  Attending Physician Name and Address:  Zannie Cove, MD  Relative Name and Phone Number:  Ysamar Kono [phone]    Current Level of Care: Hospital Recommended Level of Care: Skilled Nursing Facility Prior Approval Number:    Date Approved/Denied:   PASRR Number: Pending  Discharge Plan: SNF    Current Diagnoses: Patient Active Problem List   Diagnosis Date Noted   Malnutrition of moderate degree 02/03/2023   Bradycardia 02/01/2023   Hip fracture (HCC) 01/31/2023   Acute exacerbation of CHF (congestive heart failure) (HCC) 01/30/2023   Hypothyroidism 01/30/2023   Community acquired pneumonia 12/27/2022   Acute CHF (congestive heart failure) (HCC) 10/27/2022   Chronic kidney disease, stage 3a (HCC) 09/08/2022   Heart failure (HCC) 09/06/2022   Chronic diastolic CHF (congestive heart failure) (HCC) 09/05/2022   GERD without esophagitis 05/02/2022   Gout 05/02/2022   Left lower quadrant abdominal pain 05/02/2022   Elevated LFTs 05/02/2022   Hyponatremia 05/02/2022   Recurrent right pleural effusion 05/01/2022   Paroxysmal atrial fibrillation (HCC) 05/01/2022   Secondary hypercoagulable state (HCC) 04/04/2022   Chronic systolic CHF (congestive heart failure) (HCC) 11/28/2021   Macular degeneration 11/28/2021   MCI (mild cognitive impairment) 11/28/2021   OSA (obstructive sleep apnea)    Protein-calorie malnutrition, severe 10/22/2020   Depression    Bilateral pleural effusion 10/19/2020   Pulmonary HTN (HCC) 10/11/2020   Tricuspid regurgitation 10/11/2020   HLD (hyperlipidemia) 10/11/2020    Hypotension 10/11/2020   Hypokalemia 10/11/2020   Chest pain at rest 10/05/2020   CAD (coronary artery disease) 04/24/2014   COPD with acute exacerbation (HCC) 04/24/2014    Orientation RESPIRATION BLADDER Height & Weight     Self, Situation, Time, Place  Normal Incontinent, External catheter Weight: 112 lb 14 oz (51.2 kg) Height:  4\' 8"  (142.2 cm)  BEHAVIORAL SYMPTOMS/MOOD NEUROLOGICAL BOWEL NUTRITION STATUS      Continent Diet (see dc summary)  AMBULATORY STATUS COMMUNICATION OF NEEDS Skin   Extensive Assist Verbally Surgical wounds (Closed incision-02/01/23 R Hip, 02/02/23 Skin Tear left foot)                       Personal Care Assistance Level of Assistance  Bathing, Feeding, Dressing Bathing Assistance: Limited assistance Feeding assistance: Independent Dressing Assistance: Limited assistance     Functional Limitations Info  Sight, Hearing, Speech Sight Info: Impaired Hearing Info: Impaired Speech Info: Adequate    SPECIAL CARE FACTORS FREQUENCY  PT (By licensed PT), OT (By licensed OT)     PT Frequency: 5x week OT Frequency: 5x week            Contractures Contractures Info: Present    Additional Factors Info  Code Status, Allergies Code Status Info: DNR Allergies Info: Codeine           Current Medications (02/03/2023):  This is the current hospital active medication list Current Facility-Administered Medications  Medication Dose Route Frequency Provider Last Rate Last Admin   acetaminophen (TYLENOL) tablet 650 mg  650 mg Oral Q6H PRN [place], Jesse J, PA-C       albuterol (PROVENTIL) (2.5  MG/3ML) 0.083% nebulizer solution 2.5 mg  2.5 mg Inhalation Q4H PRN [place], Jesse J, PA-C       amiodarone (PACERONE) tablet 200 mg  200 mg Oral BID [place], Jesse J, PA-C   200 mg at 02/03/23 0347   Followed by   Melene Muller ON 02/09/2023] amiodarone (PACERONE) tablet 200 mg  200 mg Oral Daily [place], Jesse J, PA-C       apixaban Everlene Balls) tablet 5 mg  5 mg Oral  BID Zannie Cove, MD   5 mg at 02/03/23 1008   atorvastatin (LIPITOR) tablet 40 mg  40 mg Oral Daily [place], Jesse J, [place]   40 mg at 02/03/23 4259   feeding supplement (ENSURE ENLIVE / ENSURE PLUS) liquid 237 mL  237 mL Oral BID BM Zannie Cove, MD   237 mL at 02/03/23 0818   furosemide (LASIX) tablet 40 mg  40 mg Oral Daily Zannie Cove, MD   40 mg at 02/03/23 1008   HYDROmorphone (DILAUDID) injection 0.5 mg  0.5 mg Intravenous Q4H PRN [place], Jesse J, PA-C   0.5 mg at 02/01/23 2122   levothyroxine (SYNTHROID) tablet 75 mcg  75 mcg Oral QAC breakfast [place], Jesse J, PA-C   75 mcg at 02/03/23 0600   mometasone-formoterol (DULERA) 100-5 MCG/ACT inhaler 2 puff  2 puff Inhalation BID [place], Jesse J, PA-C   2 puff at 02/03/23 0830   multivitamin with minerals tablet 1 tablet  1 tablet Oral Daily Zannie Cove, MD   1 tablet at 02/03/23 0818   naloxone [facility]) injection 0.4 mg  0.4 mg Intravenous PRN [place], Jesse J, PA-C       oxyCODONE (Oxy IR/ROXICODONE) immediate release tablet 5 mg  5 mg Oral Q6H PRN [place], Jesse J, PA-C   5 mg at 02/02/23 2128   pantoprazole (PROTONIX) EC tablet 40 mg  40 mg Oral BID [place], Jesse J, PA-C   40 mg at 02/03/23 0818   polyethylene glycol (MIRALAX / GLYCOLAX) packet 17 g  17 g Oral Daily [place], Jesse J, [place]   17 g at 02/03/23 5638   potassium chloride SA (KLOR-CON M) CR tablet 20 mEq  20 mEq Oral Daily Zannie Cove, MD   20 mEq at 02/03/23 1008   senna-docusate (Senokot-S) tablet 1 tablet  1 tablet Oral BID [place], Jesse J, [place]   1 tablet at 02/03/23 7564     Discharge Medications: Please see discharge summary for a list of discharge medications.  Relevant Imaging Results:  Relevant Lab Results:   Additional Information SSN   [id number]  Carmina Miller, LCSWA

## 2023-02-03 NOTE — Plan of Care (Signed)

## 2023-02-03 NOTE — TOC Progression Note (Signed)
Transition of Care [facility]) - Progression Note    Patient Details  Name: Lakietha Wigfield MRN: [id number] Date of Birth: March 18, 1946  Transition of Care [facility]) CM/SW Contact  Carmina Miller, [place] Phone Number: 02/03/2023, 2:58 PM  Clinical Narrative:    RE: Rosemery Goines  Date of Birth: 2046/04/07 Date: 02/03/23  Please be advised that the above-named patient will require a short-term nursing home stay - anticipated 30 days or less for rehabilitation and strengthening.  The plan is for return home.         Expected Discharge Plan and Services                                               Social Determinants of Health (SDOH) Interventions SDOH Screenings   Food Insecurity: No Food Insecurity (02/02/2023)  Housing: Patient Unable To Answer (02/02/2023)  Transportation Needs: No Transportation Needs (02/02/2023)  Utilities: Not At Risk (02/02/2023)  Tobacco Use: Medium Risk (02/01/2023)    Readmission Risk Interventions    01/31/2023    3:16 PM 09/07/2022    3:25 PM  Readmission Risk Prevention Plan  Transportation Screening Complete Complete  PCP or Specialist Appt within 5-7 Days  Complete  PCP or Specialist Appt within 3-5 Days Complete   Home Care Screening  Complete  Medication Review (RN CM)  Complete  HRI or Home Care Consult Complete   Palliative Care Screening Not Applicable   Medication Review (RN Care Manager) Complete

## 2023-02-03 NOTE — Progress Notes (Signed)
PROGRESS NOTE    Abbegail Fang Chazz Demke  JYN:[id number] DOB: 1945-06-09 DOA: 01/31/2023 PCP: Georgann Housekeeper, MD  77/F w diastolic CHF, severe TR, recurrent right pleural effusion, paroxysmal A-fib on Eliquis, CKD stage IIIa, hyperlipidemia, hypothyroidism, age-related macular degeneration/blindness, coronary artery disease, COPD, GERD who presented from home with complaint of right hip pain after fall.  She was discharged home on 10/22 after she was admitted for CHF exacerbation . While at home, she lost her balance and fell on her right hip.  X-ray of the right hip/pelvis showed acute commuted right femoral intratrochanteric fracture.  Chest x-ray showed moderate right pleural effusion, small left pleural effusion, cardiomegaly with unchanged interstitial opacities.  Orthopedics consulted,  -Underwent ORIF 10/24 -10/25, hemoglobin down to 5.3, transfused 2 units of PRBC   Subjective: -Feels okay, no events overnight  Assessment and Plan:  Acute comminuted right femoral intertrochanteric fracture:  -Following mechanical fall.  Orthopedics consulted,  -Underwent ORIF 10/24 -Pain control, restart Eliquis  -PT OT eval, SNF recommended for short-term rehab, TOC consulted   Chronic diastolic CHF/severe TR: - Echo 7/24 showed EF of 55 to 60%, grade 2 diastolic dysfunction, severe TR.  Patient was recently admitted for diastolic CHF exacerbation and was discharged . -Moderate right pleural effusion noted on x-ray 10/23 but does not appear overtly volume overloaded -Start p.o. Lasix today   Leukocytosis: Currently most likely reactive.  No signs of infection   Acute blood loss anemia In the background of chronic normocytic anemia -Baseline hemoglobin around 7.5-8, was 7.2 on admission then 5.3 after surgery -transfused 2 units of PRBC, hemoglobin now 9.1, suspect 5.3 was likely erroneous -Resumed Eliquis   CKD stage IIIa: Currently creatinine at baseline.   Coronary artery disease:   -Continue statin, Eliquis resumed   Paroxysmal A-fib: Currently in normal sinus rhythm.   -Continue amiodarone, Eliquis resumed    DVT prophylaxis: Eliquis Code Status: DNR Family Communication: None present Disposition Plan: SNF likely Monday  Consultants:    Procedures:   Antimicrobials:    Objective: Vitals:   02/03/23 0412 02/03/23 0523 02/03/23 0741 02/03/23 0830  BP: (!) 96/54  (!) 95/56   Pulse: 62  68 63  Resp: 19  15 20   Temp: 98.1 F (36.7 C)  98.1 F (36.7 C)   TempSrc: Oral  Oral   SpO2: 96%  93% 94%  Weight:  51.2 kg    Height:        Intake/Output Summary (Last 24 hours) at 02/03/2023 1116 Last data filed at 02/03/2023 0600 Gross per 24 hour  Intake 610 ml  Output 1850 ml  Net -1240 ml   Filed Weights   01/31/23 1932 02/01/23 1939 02/03/23 0523  Weight: 49 kg 52.2 kg 51.2 kg    Examination:  General exam: Elderly pleasant chronically ill female sitting up in bed, AAOx3 HEENT: Positive JVD CVS: S1-S2, regular rhythm Lungs: Decreased breath sounds to bases Abdomen: Soft, nontender, bowel sounds present Right lower extremity with extensive dressing, no edema in left leg Psychiatry:  Mood & affect appropriate.     Data Reviewed:   CBC: Recent Labs  Lab 01/31/23 2020 02/01/23 0430 02/02/23 0339 02/02/23 1521 02/03/23 0253  WBC 12.7* 10.9* 14.3* 15.8* 14.3*  NEUTROABS 11.6*  --   --   --   --   HGB 8.1* 7.2* 5.3* 9.4* 9.1*  HCT 26.2* 23.6* 17.2* 28.9* 26.8*  MCV 83.4 83.4 82.7 83.5 79.8*  PLT 374 332 299 267 231   Basic  Metabolic Panel: Recent Labs  Lab 01/31/23 0749 01/31/23 2020 02/01/23 0835 02/02/23 1521 02/03/23 0253  NA 136 135 135 132* 134*  K 3.7 4.2 4.8 3.6 3.6  CL 103 100 102 100 100  CO2 22 22 20* 20* 24  GLUCOSE 100* 120* 102* 149* 104*  BUN 23 27* 32* 38* 37*  CREATININE 1.26* 1.39* 1.54* 1.76* 1.41*  CALCIUM 8.3* 8.6* 8.3* 7.9* 7.8*  MG 1.9  --   --  2.0  --    GFR: Estimated Creatinine Clearance:  22.3 mL/min (A) (by C-G formula based on SCr of 1.41 mg/dL (H)). Liver Function Tests: Recent Labs  Lab 01/30/23 1743 02/01/23 0835  AST 95* 83*  ALT 84* 80*  ALKPHOS 79 72  BILITOT 1.1 1.2  PROT 7.3 6.9  ALBUMIN 2.8* 2.6*   No results for input(s): "LIPASE", "AMYLASE" in the last 168 hours. No results for input(s): "AMMONIA" in the last 168 hours. Coagulation Profile: Recent Labs  Lab 01/31/23 2034  INR 1.8*   Cardiac Enzymes: No results for input(s): "CKTOTAL", "CKMB", "CKMBINDEX", "TROPONINI" in the last 168 hours. BNP (last 3 results) No results for input(s): "PROBNP" in the last 8760 hours. HbA1C: No results for input(s): "HGBA1C" in the last 72 hours. CBG: No results for input(s): "GLUCAP" in the last 168 hours. Lipid Profile: No results for input(s): "CHOL", "HDL", "LDLCALC", "TRIG", "CHOLHDL", "LDLDIRECT" in the last 72 hours. Thyroid Function Tests: No results for input(s): "TSH", "T4TOTAL", "FREET4", "T3FREE", "THYROIDAB" in the last 72 hours.  Anemia Panel: No results for input(s): "VITAMINB12", "FOLATE", "FERRITIN", "TIBC", "IRON", "RETICCTPCT" in the last 72 hours. Urine analysis:    Component Value Date/Time   COLORURINE YELLOW 05/01/2022 1922   APPEARANCEUR HAZY (A) 05/01/2022 1922   LABSPEC 1.029 05/01/2022 1922   PHURINE 5.0 05/01/2022 1922   GLUCOSEU 50 (A) 05/01/2022 1922   HGBUR SMALL (A) 05/01/2022 1922   BILIRUBINUR NEGATIVE 05/01/2022 1922   KETONESUR NEGATIVE 05/01/2022 1922   PROTEINUR 100 (A) 05/01/2022 1922   NITRITE NEGATIVE 05/01/2022 1922   LEUKOCYTESUR NEGATIVE 05/01/2022 1922   Sepsis Labs: @LABRCNTIP (procalcitonin:4,lacticidven:4)  ) Recent Results (from the past 240 hour(s))  Resp panel by RT-PCR (RSV, Flu A&B, Covid) Anterior Nasal Swab     Status: None   Collection Time: 01/30/23  5:43 PM   Specimen: Anterior Nasal Swab  Result Value Ref Range Status   SARS Coronavirus 2 by RT PCR NEGATIVE NEGATIVE Final   Influenza A by  PCR NEGATIVE NEGATIVE Final   Influenza B by PCR NEGATIVE NEGATIVE Final    Comment: (NOTE) The Xpert Xpress SARS-CoV-2/FLU/RSV plus assay is intended as an aid in the diagnosis of influenza from Nasopharyngeal swab specimens and should not be used as a sole basis for treatment. Nasal washings and aspirates are unacceptable for Xpert Xpress SARS-CoV-2/FLU/RSV testing.  Fact Sheet for Patients: BloggerCourse.com  Fact Sheet for Healthcare Providers: SeriousBroker.it  This test is not yet approved or cleared by the [place] FDA and has been authorized for detection and/or diagnosis of SARS-CoV-2 by FDA under an Emergency Use Authorization (EUA). This EUA will remain in effect (meaning this test can be used) for the duration of the COVID-19 declaration under Section 564(b)(1) of the Act, 21 U.S.C. section 360bbb-3(b)(1), unless the authorization is terminated or revoked.     Resp Syncytial Virus by PCR NEGATIVE NEGATIVE Final    Comment: (NOTE) Fact Sheet for Patients: BloggerCourse.com  Fact Sheet for Healthcare Providers: SeriousBroker.it  This test is  not yet approved or cleared by the [place] and has been authorized for detection and/or diagnosis of SARS-CoV-2 by FDA under an Emergency Use Authorization (EUA). This EUA will remain in effect (meaning this test can be used) for the duration of the COVID-19 declaration under Section 564(b)(1) of the Act, 21 U.S.C. section 360bbb-3(b)(1), unless the authorization is terminated or revoked.  Performed at [facility], [address] [place], [place] [address]   Surgical pcr screen     Status: None   Collection Time: 02/01/23  8:36 AM   Specimen: Nasal Mucosa; Nasal Swab  Result Value Ref Range Status   MRSA, PCR NEGATIVE NEGATIVE Final   Staphylococcus aureus NEGATIVE NEGATIVE Final    Comment:  (NOTE) The Xpert SA Assay (FDA approved for NASAL specimens in patients 37 years of age and older), is one component of a comprehensive surveillance program. It is not intended to diagnose infection nor to guide or monitor treatment. Performed at [facility], [address], [place], [place] [address]      Radiology Studies: DG FEMUR, MIN 2 VIEWS RIGHT  Result Date: 02/01/2023 CLINICAL DATA:  Known right intratrochanteric femoral fracture EXAM: RIGHT FEMUR 2 VIEWS COMPARISON:  Films from the previous day. FLUOROSCOPY TIME:  Radiation Exposure Index (as provided by the fluoroscopic device): 14.13 mGy If the device does not provide the exposure index: Fluoroscopy Time:  83 seconds Number of Acquired Images:  4 FINDINGS: Proximal medullary rod with fixation screw traversing the femoral neck is seen. Distal fixation screw is noted as well. Fracture fragments are in near anatomic alignment. IMPRESSION: ORIF of right femoral fracture. Electronically Signed   By: Alcide Clever M.D.   On: 02/01/2023 17:07   DG C-Arm 1-60 Min-No Report  Result Date: 02/01/2023 Fluoroscopy was utilized by the requesting physician.  No radiographic interpretation.     Scheduled Meds:  amiodarone  200 mg Oral BID   Followed by   Melene Muller ON 02/09/2023] amiodarone  200 mg Oral Daily   apixaban  5 mg Oral BID   atorvastatin  40 mg Oral Daily   feeding supplement  237 mL Oral BID BM   furosemide  40 mg Oral Daily   levothyroxine  75 mcg Oral QAC breakfast   mometasone-formoterol  2 puff Inhalation BID   multivitamin with minerals  1 tablet Oral Daily   pantoprazole  40 mg Oral BID   polyethylene glycol  17 g Oral Daily   potassium chloride  20 mEq Oral Daily   senna-docusate  1 tablet Oral BID   Continuous Infusions:   LOS: 3 days    Time spent:    Zannie Cove, MD [facility]   02/03/2023, 11:16 AM

## 2023-02-03 NOTE — TOC Initial Note (Signed)
Transition of Care [facility]) - Initial/Assessment Note    Patient Details  Name: Laurie James MRN: [id number] Date of Birth: 06-13-1945  Transition of Care [facility]) CM/SW Contact:    Carmina Miller, LCSWA Phone Number: 02/03/2023, 2:21 PM  Clinical Narrative:                  CSW received consult for SNF, spoke with pt's spouse who states he does not believe pt would be agreeable to SNF but agreed she may need it. Spouse agreeable to workup and understands that if pt doesn't want to go we can explore HH options. CSW explained insurance auth process, spouse has a preference for [facility].        Patient Goals and CMS Choice            Expected Discharge Plan and Services                                              Prior Living Arrangements/Services                       Activities of Daily Living   ADL Screening (condition at time of admission) Independently performs ADLs?: Yes (appropriate for developmental age) Is the patient deaf or have difficulty hearing?: No Does the patient have difficulty seeing, even when wearing glasses/contacts?: Yes Does the patient have difficulty concentrating, remembering, or making decisions?: No  Permission Sought/Granted                  Emotional Assessment              Admission diagnosis:  Hip fracture (HCC) [S72.009A] Closed fracture of right hip, initial encounter (HCC) [S72.001A] Patient Active Problem List   Diagnosis Date Noted   Malnutrition of moderate degree 02/03/2023   Bradycardia 02/01/2023   Hip fracture (HCC) 01/31/2023   Acute exacerbation of CHF (congestive heart failure) (HCC) 01/30/2023   Hypothyroidism 01/30/2023   Community acquired pneumonia 12/27/2022   Acute CHF (congestive heart failure) (HCC) 10/27/2022   Chronic kidney disease, stage 3a (HCC) 09/08/2022   Heart failure (HCC) 09/06/2022   Chronic diastolic CHF (congestive heart failure) (HCC) 09/05/2022    GERD without esophagitis 05/02/2022   Gout 05/02/2022   Left lower quadrant abdominal pain 05/02/2022   Elevated LFTs 05/02/2022   Hyponatremia 05/02/2022   Recurrent right pleural effusion 05/01/2022   Paroxysmal atrial fibrillation (HCC) 05/01/2022   Secondary hypercoagulable state (HCC) 04/04/2022   Chronic systolic CHF (congestive heart failure) (HCC) 11/28/2021   Macular degeneration 11/28/2021   MCI (mild cognitive impairment) 11/28/2021   OSA (obstructive sleep apnea)    Protein-calorie malnutrition, severe 10/22/2020   Depression    Bilateral pleural effusion 10/19/2020   Pulmonary HTN (HCC) 10/11/2020   Tricuspid regurgitation 10/11/2020   HLD (hyperlipidemia) 10/11/2020   Hypotension 10/11/2020   Hypokalemia 10/11/2020   Chest pain at rest 10/05/2020   CAD (coronary artery disease) 04/24/2014   COPD with acute exacerbation (HCC) 04/24/2014   PCP:  Georgann Housekeeper, MD Pharmacy:   [facility] [address], [place] - [address] [facility] [place] [address] Phone: [phone] Fax: [phone]     Social Determinants of Health (SDOH) Social History: SDOH Screenings   Food Insecurity: No Food Insecurity (02/02/2023)  Housing: Patient Unable To  Answer (02/02/2023)  Transportation Needs: No Transportation Needs (02/02/2023)  Utilities: Not At Risk (02/02/2023)  Tobacco Use: Medium Risk (02/01/2023)   SDOH Interventions:     Readmission Risk Interventions    01/31/2023    3:16 PM 09/07/2022    3:25 PM  Readmission Risk Prevention Plan  Transportation Screening Complete Complete  PCP or Specialist Appt within 5-7 Days  Complete  PCP or Specialist Appt within 3-5 Days Complete   Home Care Screening  Complete  Medication Review (RN CM)  Complete  HRI or Home Care Consult Complete   Palliative Care Screening Not Applicable   Medication Review (RN Care Manager) Complete

## 2023-02-04 DIAGNOSIS — S72001D Fracture of unspecified part of neck of right femur, subsequent encounter for closed fracture with routine healing: Secondary | ICD-10-CM | POA: Diagnosis not present

## 2023-02-04 LAB — BASIC METABOLIC PANEL
Anion gap: 9 (ref 5–15)
BUN: 28 mg/dL — ABNORMAL HIGH (ref 8–23)
CO2: 24 mmol/L (ref 22–32)
Calcium: 7.7 mg/dL — ABNORMAL LOW (ref 8.9–10.3)
Chloride: 99 mmol/L (ref 98–111)
Creatinine, Ser: 1.2 mg/dL — ABNORMAL HIGH (ref 0.44–1.00)
GFR, Estimated: 47 mL/min — ABNORMAL LOW (ref >60)
Glucose, Bld: 94 mg/dL (ref 70–99)
Potassium: 3.7 mmol/L (ref 3.5–5.1)
Sodium: 132 mmol/L — ABNORMAL LOW (ref 135–145)

## 2023-02-04 LAB — CBC
HCT: 27.6 % — ABNORMAL LOW (ref 36.0–46.0)
Hemoglobin: 9.1 g/dL — ABNORMAL LOW (ref 12.0–15.0)
MCH: 27.5 pg (ref 26.0–34.0)
MCHC: 33 g/dL (ref 30.0–36.0)
MCV: 83.4 fL (ref 80.0–100.0)
Platelets: 245 10*3/uL (ref 150–400)
RBC: 3.31 MIL/uL — ABNORMAL LOW (ref 3.87–5.11)
RDW: 18.3 % — ABNORMAL HIGH (ref 11.5–15.5)
WBC: 12.4 10*3/uL — ABNORMAL HIGH (ref 4.0–10.5)
nRBC: 0.2 % (ref 0.0–0.2)

## 2023-02-04 NOTE — Progress Notes (Addendum)
PROGRESS NOTE    Laurie James  DGL:[id number] DOB: [birth year] DOA: 01/31/2023 PCP: Georgann Housekeeper, MD  77/F w diastolic CHF, severe TR, recurrent right pleural effusion, paroxysmal A-fib on Eliquis, CKD stage IIIa, hyperlipidemia, hypothyroidism, age-related macular degeneration/blindness, coronary artery disease, COPD, GERD who presented from home with complaint of right hip pain after fall.  She was discharged home on 10/22 after she was admitted for CHF exacerbation . While at home, she lost her balance and fell on her right hip.  X-ray of the right hip/pelvis showed acute commuted right femoral intratrochanteric fracture.  Chest x-ray showed moderate right pleural effusion, small left pleural effusion, cardiomegaly with unchanged interstitial opacities.  Orthopedics consulted,  -Underwent ORIF 10/24 -10/25, hemoglobin down to 5.3, transfused 2 units of PRBC   Subjective: -Feels okay, no events overnight  Assessment and Plan:  Acute comminuted right femoral intertrochanteric fracture:  -Following mechanical fall.  Orthopedics consulted,  -Underwent ORIF 10/24 -Pain control, restarted Eliquis -PT OT eval, SNF recommended for short-term rehab, TOC consulted -Discharge planning, follow-up with Dr. Susa Simmonds orthopedics in 2 weeks for wound check and staple removal   Chronic diastolic CHF/severe TR: - Echo 7/24 showed EF of 55 to 60%, grade 2 diastolic dysfunction, severe TR.  Patient was recently admitted for diastolic CHF exacerbation and was discharged . -Moderate right pleural effusion noted on x-ray 10/23 but does not appear overtly volume overloaded -Continue oral Lasix -Cards notes reviewed, she has not tolerated multiple attempts at GDMT   Leukocytosis: Currently most likely reactive.  No signs of infection   Acute blood loss anemia In the background of chronic normocytic anemia -Baseline hemoglobin around 7.5-8, was 7.2 on admission then 5.3 after  surgery -transfused 2 units of PRBC, hemoglobin now 9.1, suspect 5.3 was likely erroneous -Resumed Eliquis   CKD stage IIIa: Currently creatinine at baseline.   Coronary artery disease:  -Continue statin, Eliquis resumed   Paroxysmal A-fib: Currently in normal sinus rhythm.   -Continue amiodarone, Eliquis resumed    DVT prophylaxis: Eliquis Code Status: DNR Family Communication: None present Disposition Plan: SNF likely Monday, Tuesday  Consultants:    Procedures:   Antimicrobials:    Objective: Vitals:   02/03/23 2100 02/04/23 0000 02/04/23 0400 02/04/23 0755  BP:  (!) 116/55 (!) 112/55 (!) 91/51  Pulse:  65 63 60  Resp:    20  Temp:  99.7 F (37.6 C) 98.7 F (37.1 C) 98.2 F (36.8 C)  TempSrc:  Oral Oral Oral  SpO2: 97% 97% 100% 98%  Weight:      Height:        Intake/Output Summary (Last 24 hours) at 02/04/2023 1137 Last data filed at 02/04/2023 5188 Gross per 24 hour  Intake 120 ml  Output 2150 ml  Net -2030 ml   Filed Weights   01/31/23 1932 02/01/23 1939 02/03/23 0523  Weight: 49 kg 52.2 kg 51.2 kg    Examination:  General exam: Elderly pleasant female sitting up in bed, AAOx3 HEENT: No JVD CVS: S1-S2, regular rhythm Lungs: Decreased breath sounds at the bases Abdomen: Soft, nontender, bowel sounds present Extremities: No edema, right hip with dressing Psychiatry:  Mood & affect appropriate.     Data Reviewed:   CBC: Recent Labs  Lab 01/31/23 2020 02/01/23 0430 02/02/23 0339 02/02/23 1521 02/03/23 0253 02/04/23 0238  WBC 12.7* 10.9* 14.3* 15.8* 14.3* 12.4*  NEUTROABS 11.6*  --   --   --   --   --  HGB 8.1* 7.2* 5.3* 9.4* 9.1* 9.1*  HCT 26.2* 23.6* 17.2* 28.9* 26.8* 27.6*  MCV 83.4 83.4 82.7 83.5 79.8* 83.4  PLT 374 332 299 267 231 245   Basic Metabolic Panel: Recent Labs  Lab 01/31/23 0749 01/31/23 2020 02/01/23 0835 02/02/23 1521 02/03/23 0253 02/04/23 0238  NA 136 135 135 132* 134* 132*  K 3.7 4.2 4.8 3.6 3.6 3.7   CL 103 100 102 100 100 99  CO2 22 22 20* 20* 24 24  GLUCOSE 100* 120* 102* 149* 104* 94  BUN 23 27* 32* 38* 37* 28*  CREATININE 1.26* 1.39* 1.54* 1.76* 1.41* 1.20*  CALCIUM 8.3* 8.6* 8.3* 7.9* 7.8* 7.7*  MG 1.9  --   --  2.0  --   --    GFR: Estimated Creatinine Clearance: 26.2 mL/min (A) (by C-G formula based on SCr of 1.2 mg/dL (H)). Liver Function Tests: Recent Labs  Lab 01/30/23 1743 02/01/23 0835  AST 95* 83*  ALT 84* 80*  ALKPHOS 79 72  BILITOT 1.1 1.2  PROT 7.3 6.9  ALBUMIN 2.8* 2.6*   No results for input(s): "LIPASE", "AMYLASE" in the last 168 hours. No results for input(s): "AMMONIA" in the last 168 hours. Coagulation Profile: Recent Labs  Lab 01/31/23 2034  INR 1.8*   Cardiac Enzymes: No results for input(s): "CKTOTAL", "CKMB", "CKMBINDEX", "TROPONINI" in the last 168 hours. BNP (last 3 results) No results for input(s): "PROBNP" in the last 8760 hours. HbA1C: No results for input(s): "HGBA1C" in the last 72 hours. CBG: No results for input(s): "GLUCAP" in the last 168 hours. Lipid Profile: No results for input(s): "CHOL", "HDL", "LDLCALC", "TRIG", "CHOLHDL", "LDLDIRECT" in the last 72 hours. Thyroid Function Tests: No results for input(s): "TSH", "T4TOTAL", "FREET4", "T3FREE", "THYROIDAB" in the last 72 hours.  Anemia Panel: No results for input(s): "VITAMINB12", "FOLATE", "FERRITIN", "TIBC", "IRON", "RETICCTPCT" in the last 72 hours. Urine analysis:    Component Value Date/Time   COLORURINE YELLOW 05/01/2022 1922   APPEARANCEUR HAZY (A) 05/01/2022 1922   LABSPEC 1.029 05/01/2022 1922   PHURINE 5.0 05/01/2022 1922   GLUCOSEU 50 (A) 05/01/2022 1922   HGBUR SMALL (A) 05/01/2022 1922   BILIRUBINUR NEGATIVE 05/01/2022 1922   KETONESUR NEGATIVE 05/01/2022 1922   PROTEINUR 100 (A) 05/01/2022 1922   NITRITE NEGATIVE 05/01/2022 1922   LEUKOCYTESUR NEGATIVE 05/01/2022 1922   Sepsis Labs: @LABRCNTIP (procalcitonin:4,lacticidven:4)  ) Recent Results  (from the past 240 hour(s))  Resp panel by RT-PCR (RSV, Flu A&B, Covid) Anterior Nasal Swab     Status: None   Collection Time: 01/30/23  5:43 PM   Specimen: Anterior Nasal Swab  Result Value Ref Range Status   SARS Coronavirus 2 by RT PCR NEGATIVE NEGATIVE Final   Influenza A by PCR NEGATIVE NEGATIVE Final   Influenza B by PCR NEGATIVE NEGATIVE Final    Comment: (NOTE) The Xpert Xpress SARS-CoV-2/FLU/RSV plus assay is intended as an aid in the diagnosis of influenza from Nasopharyngeal swab specimens and should not be used as a sole basis for treatment. Nasal washings and aspirates are unacceptable for Xpert Xpress SARS-CoV-2/FLU/RSV testing.  Fact Sheet for Patients: BloggerCourse.com  Fact Sheet for Healthcare Providers: SeriousBroker.it  This test is not yet approved or cleared by the [place] FDA and has been authorized for detection and/or diagnosis of SARS-CoV-2 by FDA under an Emergency Use Authorization (EUA). This EUA will remain in effect (meaning this test can be used) for the duration of the COVID-19 declaration under Section 564(b)(1)  of the Act, 21 U.S.C. section 360bbb-3(b)(1), unless the authorization is terminated or revoked.     Resp Syncytial Virus by PCR NEGATIVE NEGATIVE Final    Comment: (NOTE) Fact Sheet for Patients: BloggerCourse.com  Fact Sheet for Healthcare Providers: SeriousBroker.it  This test is not yet approved or cleared by the [place] FDA and has been authorized for detection and/or diagnosis of SARS-CoV-2 by FDA under an Emergency Use Authorization (EUA). This EUA will remain in effect (meaning this test can be used) for the duration of the COVID-19 declaration under Section 564(b)(1) of the Act, 21 U.S.C. section 360bbb-3(b)(1), unless the authorization is terminated or revoked.  Performed at [facility], [address], [place], [place] [address]   Surgical pcr screen     Status: None   Collection Time: 02/01/23  8:36 AM   Specimen: Nasal Mucosa; Nasal Swab  Result Value Ref Range Status   MRSA, PCR NEGATIVE NEGATIVE Final   Staphylococcus aureus NEGATIVE NEGATIVE Final    Comment: (NOTE) The Xpert SA Assay (FDA approved for NASAL specimens in patients 50 years of age and older), is one component of a comprehensive surveillance program. It is not intended to diagnose infection nor to guide or monitor treatment. Performed at [facility], [address], [place], [place] [address]      Radiology Studies: No results found.   Scheduled Meds:  amiodarone  200 mg Oral BID   Followed by   Melene Muller ON 02/09/2023] amiodarone  200 mg Oral Daily   apixaban  5 mg Oral BID   atorvastatin  40 mg Oral Daily   feeding supplement  237 mL Oral BID BM   furosemide  40 mg Oral Daily   levothyroxine  75 mcg Oral QAC breakfast   mometasone-formoterol  2 puff Inhalation BID   multivitamin with minerals  1 tablet Oral Daily   pantoprazole  40 mg Oral BID   polyethylene glycol  17 g Oral Daily   potassium chloride  20 mEq Oral Daily   senna-docusate  1 tablet Oral BID   Continuous Infusions:   LOS: 4 days    Time spent:    Zannie Cove, MD [facility]   02/04/2023, 11:37 AM

## 2023-02-04 NOTE — Progress Notes (Signed)
PATIENT ID: Laurie James  MRN: [id number]  DOB/AGE:  [birth year] / 77 y.o.  3 Days Post-Op Procedure(s) (LRB): INTRAMEDULLARY (IM) NAIL INTERTROCHANTERIC (Right)  Subjective: Patient resting comfortably in room. Continues to state that she is feeling better. No issues overnight.  Objective: Vital signs in last 24 hours: Temp:  [98 F (36.7 C)-99.7 F (37.6 C)] 98.2 F (36.8 C) (10/27 0755) Pulse Rate:  [60-66] 60 (10/27 0755) Resp:  [17-20] 20 (10/27 0755) BP: (91-116)/(51-61) 91/51 (10/27 0755) SpO2:  [96 %-100 %] 98 % (10/27 0755)  Intake/Output from previous day: 10/26 0701 - 10/27 0700 In: -  Out: 2150 [Urine:2150] Intake/Output this shift: Total I/O In: 120 [P.O.:120] Out: -   Recent Labs    02/02/23 0339 02/02/23 1521 02/03/23 0253 02/04/23 0238  HGB 5.3* 9.4* 9.1* 9.1*   Recent Labs    02/03/23 0253 02/04/23 0238  WBC 14.3* 12.4*  RBC 3.36* 3.31*  HCT 26.8* 27.6*  PLT 231 245   Recent Labs    02/03/23 0253 02/04/23 0238  NA 134* 132*  K 3.6 3.7  CL 100 99  CO2 24 24  BUN 37* 28*  CREATININE 1.41* 1.20*  GLUCOSE 104* 94  CALCIUM 7.8* 7.7*     Physical Exam: Neurologically intact Sensation intact distally Intact pulses distally Dorsiflexion/Plantar flexion intact Incision: dressing C/D/I No cellulitis present Compartment soft  Assessment/Plan: 3 Days Post-Op Procedure(s) (LRB): INTRAMEDULLARY (IM) NAIL INTERTROCHANTERIC (Right)  Postop day 3 status post the above, doing well Expected acute blood loss anemia, Hgb up to 9.1 yesterday   Weightbearing as tolerated with a walker. She will work with therapies. Disposition Per primary team.   We will plan to see the patient outpatient 2 weeks from surgery at [facility] orthopedics with Dr. Susa Simmonds for radiographs of the operative hip, wound check, and likely staple removal.  Montine Hight L. [place], PA-C 02/04/2023, 9:28 AM

## 2023-02-04 NOTE — Progress Notes (Signed)
Report given to [facility] - Patient ready to be transferred.

## 2023-02-04 NOTE — Plan of Care (Signed)

## 2023-02-05 DIAGNOSIS — Z7901 Long term (current) use of anticoagulants: Secondary | ICD-10-CM | POA: Diagnosis not present

## 2023-02-05 DIAGNOSIS — R748 Abnormal levels of other serum enzymes: Secondary | ICD-10-CM | POA: Diagnosis not present

## 2023-02-05 DIAGNOSIS — Z7401 Bed confinement status: Secondary | ICD-10-CM | POA: Diagnosis not present

## 2023-02-05 DIAGNOSIS — H3530 Unspecified macular degeneration: Secondary | ICD-10-CM | POA: Diagnosis not present

## 2023-02-05 DIAGNOSIS — N1831 Chronic kidney disease, stage 3a: Secondary | ICD-10-CM | POA: Diagnosis not present

## 2023-02-05 DIAGNOSIS — S61200A Unspecified open wound of right index finger without damage to nail, initial encounter: Secondary | ICD-10-CM | POA: Diagnosis not present

## 2023-02-05 DIAGNOSIS — I480 Paroxysmal atrial fibrillation: Secondary | ICD-10-CM | POA: Diagnosis not present

## 2023-02-05 DIAGNOSIS — R601 Generalized edema: Secondary | ICD-10-CM | POA: Diagnosis not present

## 2023-02-05 DIAGNOSIS — S72001D Fracture of unspecified part of neck of right femur, subsequent encounter for closed fracture with routine healing: Secondary | ICD-10-CM | POA: Diagnosis not present

## 2023-02-05 DIAGNOSIS — Z66 Do not resuscitate: Secondary | ICD-10-CM | POA: Diagnosis not present

## 2023-02-05 DIAGNOSIS — S81801A Unspecified open wound, right lower leg, initial encounter: Secondary | ICD-10-CM | POA: Diagnosis not present

## 2023-02-05 DIAGNOSIS — S7290XA Unspecified fracture of unspecified femur, initial encounter for closed fracture: Secondary | ICD-10-CM | POA: Diagnosis not present

## 2023-02-05 DIAGNOSIS — S81802A Unspecified open wound, left lower leg, initial encounter: Secondary | ICD-10-CM | POA: Diagnosis not present

## 2023-02-05 DIAGNOSIS — S41102A Unspecified open wound of left upper arm, initial encounter: Secondary | ICD-10-CM | POA: Diagnosis not present

## 2023-02-05 DIAGNOSIS — I5030 Unspecified diastolic (congestive) heart failure: Secondary | ICD-10-CM | POA: Diagnosis not present

## 2023-02-05 DIAGNOSIS — S72141A Displaced intertrochanteric fracture of right femur, initial encounter for closed fracture: Secondary | ICD-10-CM | POA: Diagnosis not present

## 2023-02-05 DIAGNOSIS — I959 Hypotension, unspecified: Secondary | ICD-10-CM | POA: Diagnosis not present

## 2023-02-05 DIAGNOSIS — I361 Nonrheumatic tricuspid (valve) insufficiency: Secondary | ICD-10-CM | POA: Diagnosis not present

## 2023-02-05 DIAGNOSIS — J441 Chronic obstructive pulmonary disease with (acute) exacerbation: Secondary | ICD-10-CM | POA: Diagnosis not present

## 2023-02-05 MED ORDER — SENNOSIDES-DOCUSATE SODIUM 8.6-50 MG PO TABS: 1.0000 | ORAL_TABLET | Freq: Every day | ORAL | Status: DC

## 2023-02-05 MED ORDER — OXYCODONE HCL 5 MG PO TABS: 5.0000 mg | ORAL_TABLET | Freq: Four times a day (QID) | ORAL | 0 refills | Status: DC | PRN

## 2023-02-05 MED ORDER — AMIODARONE HCL 200 MG PO TABS: ORAL_TABLET | ORAL | Status: DC

## 2023-02-05 MED ORDER — POLYETHYLENE GLYCOL 3350 17 G PO PACK: 17.0000 g | PACK | Freq: Every day | ORAL | Status: AC | PRN

## 2023-02-05 NOTE — Care Management Important Message (Signed)
Important Message  Patient Details  Name: Laurie James MRN: [id number] Date of Birth: [birth year]   Important Message Given:  Yes - Medicare IM     Sherilyn Banker 02/05/2023, 4:24 PM

## 2023-02-05 NOTE — TOC Progression Note (Signed)
Transition of Care [facility]) - Progression Note    Patient Details  Name: Laurie James MRN: [id number] Date of Birth: 1946-03-06  Transition of Care [facility]) CM/SW Contact  Lorri Frederick, LCSW Phone Number: 02/05/2023, 1:00 PM  Clinical Narrative:   CSW met with pt regarding bed offers, pt would like to accept offer at [facility].  CSW confirmed with [facility] that they can receive pt today if approved.  New PT note needed for insurance auth, PT aware.  1245: New PT note complete, SNF auth request submitted and approved: [id number], 3 days: 10/28-10/30.    MD informed.           Expected Discharge Plan and Services                                               Social Determinants of Health (SDOH) Interventions SDOH Screenings   Food Insecurity: No Food Insecurity (02/02/2023)  Housing: Patient Unable To Answer (02/02/2023)  Transportation Needs: No Transportation Needs (02/02/2023)  Utilities: Not At Risk (02/02/2023)  Tobacco Use: Medium Risk (02/01/2023)    Readmission Risk Interventions    01/31/2023    3:16 PM 09/07/2022    3:25 PM  Readmission Risk Prevention Plan  Transportation Screening Complete Complete  PCP or Specialist Appt within 5-7 Days  Complete  PCP or Specialist Appt within 3-5 Days Complete   Home Care Screening  Complete  Medication Review (RN CM)  Complete  HRI or Home Care Consult Complete   Palliative Care Screening Not Applicable   Medication Review (RN Care Manager) Complete

## 2023-02-05 NOTE — Plan of Care (Signed)
  Problem: Education: Goal: Knowledge of General Education information will improve Description: Including pain rating scale, medication(s)/side effects and non-pharmacologic comfort measures Outcome: Progressing   Problem: Health Behavior/Discharge Planning: Goal: Ability to manage health-related needs will improve Outcome: Progressing   Problem: Coping: Goal: Level of anxiety will decrease Outcome: Progressing   Problem: Elimination: Goal: Will not experience complications related to bowel motility Outcome: Progressing Goal: Will not experience complications related to urinary retention Outcome: Progressing   Problem: Pain Management: Goal: General experience of comfort will improve Outcome: Progressing   Problem: Safety: Goal: Ability to remain free from injury will improve Outcome: Progressing   Problem: Skin Integrity: Goal: Risk for impaired skin integrity will decrease Outcome: Progressing

## 2023-02-05 NOTE — TOC Transition Note (Signed)
Transition of Care [facility]) - CM/SW Discharge Note   Patient Details  Name: Laurie James MRN: [id number] Date of Birth: 1945/10/09  [facility]) CM/SW Contact:  Lorri Frederick, LCSW Phone Number: 02/05/2023, 3:22 PM   Clinical Narrative:   Pt discharging to [facility].  RN call report to [phone].    Final next level of care: Skilled Nursing Facility Barriers to Discharge: Barriers Resolved   Patient Goals and CMS Choice      Discharge Placement                Patient chooses bed at: [facility] [place] Patient to be transferred to facility by: ptar Name of family member notified: husband Fayrene Fearing Patient and family notified of of transfer: 02/05/23  Discharge Plan and Services Additional resources added to the After Visit Summary for                                       Social Determinants of Health (SDOH) Interventions SDOH Screenings   Food Insecurity: No Food Insecurity (02/02/2023)  Housing: Patient Unable To Answer (02/02/2023)  Transportation Needs: No Transportation Needs (02/02/2023)  Utilities: Not At Risk (02/02/2023)  Tobacco Use: Medium Risk (02/01/2023)     Readmission Risk Interventions    01/31/2023    3:16 PM 09/07/2022    3:25 PM  Readmission Risk Prevention Plan  Transportation Screening Complete Complete  PCP or Specialist Appt within 5-7 Days  Complete  PCP or Specialist Appt within 3-5 Days Complete   Home Care Screening  Complete  Medication Review (RN CM)  Complete  HRI or Home Care Consult Complete   Palliative Care Screening Not Applicable   Medication Review (RN Care Manager) Complete

## 2023-02-05 NOTE — Progress Notes (Signed)
Physician Discharge Summary  Laurie James [place] WUJ:[id number] DOB: [birth year] DOA: 01/31/2023  PCP: Georgann Housekeeper, MD  Admit date: 01/31/2023 Discharge date: 02/05/2023  Time spent: 45 minutes  Recommendations for Outpatient Follow-up:  SNF for short-term rehab, weightbearing as tolerated Orthopedics Dr. Susa Simmonds in 2 weeks for wound check, staple removal Cardiology Dr. Gala Romney in 1 month   Discharge Diagnoses:  Principal Problem:   Hip fracture [facility]) Active Problems:   Chronic diastolic CHF (congestive heart failure) (HCC)   Paroxysmal atrial fibrillation (HCC)   CAD (coronary artery disease)   Tricuspid regurgitation   Bradycardia   Malnutrition of moderate degree   Discharge Condition: Improved  Diet recommendation: Low-sodium, heart healthy  Filed Weights   01/31/23 1932 02/01/23 1939 02/03/23 0523  Weight: 49 kg 52.2 kg 51.2 kg    History of present illness:  77/F w diastolic CHF, severe TR, recurrent right pleural effusion, paroxysmal A-fib on Eliquis, CKD stage IIIa, hyperlipidemia, hypothyroidism, age-related macular degeneration/blindness, coronary artery disease, COPD, GERD who presented from home with complaint of right hip pain after fall.  She was discharged home on 10/22 after she was admitted for CHF exacerbation . While at home, she lost her balance and fell on her right hip.  X-ray of the right hip/pelvis showed acute commuted right femoral intratrochanteric fracture.  Chest x-ray showed moderate right pleural effusion, small left pleural effusion, cardiomegaly with unchanged interstitial opacities.  Orthopedics consulted,  -Underwent ORIF 10/24 -10/25, hemoglobin down to 5.3, transfused 2 units of PRBC  Hospital Course:  Acute comminuted right femoral intertrochanteric fracture:  -Following mechanical fall.  Orthopedics consulted,  -Underwent ORIF 10/24 -Pain control, restarted Eliquis -PT OT eval, SNF recommended for short-term rehab, TOC  consulted -Discharge planning, follow-up with Dr. Susa Simmonds orthopedics in 2 weeks for wound check and staple removal   Chronic diastolic CHF/severe TR: -Previously with systolic CHF then had EF recovery - Echo 7/24 showed EF of 55 to 60%, grade 2 diastolic dysfunction, severe TR.  Patient was recently admitted for diastolic CHF exacerbation and was discharged . -mild to moderate right pleural effusion noted on x-ray 10/23 but does not appear overtly volume overloaded diuresed with IV Lasix initially and then switched back to oral -Continue Lasix 20 Mg 3 days a week per home regimen -Cards notes reviewed, she has not tolerated multiple attempts at GDMT   Leukocytosis: -likely reactive.  No signs of infection   Acute blood loss anemia In the background of chronic normocytic anemia -Baseline hemoglobin around 7.5-8, was 7.2 on admission then 5.3 after surgery -transfused 2 units of PRBC, hemoglobin now 9.1, suspect 5.3 was likely erroneous -Resumed Eliquis   CKD stage IIIa:  -Stable, creatinine at baseline.   Coronary artery disease:  -Continue statin, Eliquis resumed   Paroxysmal A-fib: Currently in normal sinus rhythm.   -Continue amiodarone, Eliquis resumed    Code Status: DNR  Procedure10/24/2024 PROCEDURE: Cephalomedullary nail of right hip fracture   SURGEON: Dub Mikes, MD  Discharge Exam: Vitals:   02/05/23 0757 02/05/23 0915  BP:  115/62  Pulse: 61 62  Resp: 18 18  Temp:  97.7 F (36.5 C)  SpO2: 91% 99%   General exam: Elderly pleasant female sitting up in bed, AAOx3 HEENT: No JVD CVS: S1-S2, regular rhythm Lungs: Decreased breath sounds at the bases Abdomen: Soft, nontender, bowel sounds present Extremities: No edema, right hip with dressing Psychiatry:  Mood & affect appropriate.   Discharge Instructions    Allergies as of 02/05/2023  Reactions   Codeine Nausea And Vomiting, Other (See Comments)   Upsets the stomach         Medication List     TAKE these medications    albuterol 108 (90 Base) MCG/ACT inhaler Commonly known as: VENTOLIN HFA Inhale 2 puffs into the lungs every 4 (four) hours as needed for wheezing.   amiodarone 200 MG tablet Commonly known as: PACERONE Take 1 tablet (200 mg total) by mouth daily for 325 days, THEN 1 tablet (200 mg total) daily. Start taking on: February 05, 2023 What changed: See the new instructions.   atorvastatin 40 MG tablet Commonly known as: LIPITOR Stop taking until you see your PCP and obtain repeat labwork. What changed:  how much to take how to take this when to take this   budesonide-formoterol 80-4.5 MCG/ACT inhaler Commonly known as: Symbicort Inhale 2 puffs into the lungs in the morning and at bedtime.   cyanocobalamin 1000 MCG/ML injection Commonly known as: VITAMIN B12 Inject 1,000 mcg into the muscle every 30 (thirty) days.   Eliquis 2.5 MG Tabs tablet Generic drug: apixaban Take 1 tablet by mouth twice daily   furosemide 20 MG tablet Commonly known as: LASIX Take 1 tablet (20 mg total) by mouth every Monday, Wednesday, and Friday. Leg edema   mupirocin ointment 2 % Commonly known as: BACTROBAN Apply 1 Application topically daily.   nitroGLYCERIN 0.4 MG SL tablet Commonly known as: NITROSTAT Place 1 tablet (0.4 mg total) under the tongue every 5 (five) minutes x 3 doses as needed for chest pain.   ondansetron 4 MG tablet Commonly known as: ZOFRAN Take 4 mg by mouth 3 (three) times daily as needed for nausea or vomiting.   oxyCODONE 5 MG immediate release tablet Commonly known as: Oxy IR/ROXICODONE Take 1 tablet (5 mg total) by mouth every 6 (six) hours as needed for moderate pain (pain score 4-6).   pantoprazole 40 MG tablet Commonly known as: PROTONIX Take 1 tablet (40 mg total) by mouth 2 (two) times daily.   polyethylene glycol 17 g packet Commonly known as: MIRALAX / GLYCOLAX Take 17 g by mouth daily as needed for up to 10  days.   potassium chloride 10 MEQ tablet Commonly known as: KLOR-CON Take 1 tablet (10 mEq total) by mouth every Monday, Wednesday, and Friday.   senna-docusate 8.6-50 MG tablet Commonly known as: Senokot-S Take 1 tablet by mouth at bedtime.   Synthroid 75 MCG tablet Generic drug: levothyroxine Take 75 mcg by mouth daily before breakfast.       Allergies  Allergen Reactions   Codeine Nausea And Vomiting and Other (See Comments)    Upsets the stomach    Follow-up Information     [facility] Follow up on 02/23/2023.   Specialty: Cardiology Why: Follow up in the AHF clinic 02/23/23 1130 Contact information: [address] [place] [address] [phone]                 The results of significant diagnostics from this hospitalization (including imaging, microbiology, ancillary and laboratory) are listed below for reference.    Significant Diagnostic Studies: DG FEMUR, MIN 2 VIEWS RIGHT  Result Date: 02/01/2023 CLINICAL DATA:  Known right intratrochanteric femoral fracture EXAM: RIGHT FEMUR 2 VIEWS COMPARISON:  Films from the previous day. FLUOROSCOPY TIME:  Radiation Exposure Index (as provided by the fluoroscopic device): 14.13 mGy If the device does not provide the exposure index: Fluoroscopy  Time:  83 seconds Number of Acquired Images:  4 FINDINGS: Proximal medullary rod with fixation screw traversing the femoral neck is seen. Distal fixation screw is noted as well. Fracture fragments are in near anatomic alignment. IMPRESSION: ORIF of right femoral fracture. Electronically Signed   By: Alcide Clever M.D.   On: 02/01/2023 17:07   DG C-Arm 1-60 Min-No Report  Result Date: 02/01/2023 Fluoroscopy was utilized by the requesting physician.  No radiographic interpretation.   DG Hip Unilat With Pelvis 2-3 Views Right  Result Date: 01/31/2023 CLINICAL DATA:  Fall EXAM: DG HIP (WITH OR WITHOUT PELVIS) 2-3V  RIGHT COMPARISON:  None Available. FINDINGS: There is an acute comminuted right femoral intratrochanteric fracture. There is 1 shaft with superolateral displacement of the distal fracture fragment. There is no dislocation. IMPRESSION: Acute comminuted right femoral intratrochanteric fracture. Electronically Signed   By: Darliss Cheney M.D.   On: 01/31/2023 21:42   DG Chest Port 1 View  Result Date: 01/31/2023 CLINICAL DATA:  Fall EXAM: PORTABLE CHEST 1 VIEW COMPARISON:  Chest x-ray 01/30/2023 FINDINGS: Moderate right effusion is stable. Small left pleural effusion has decreased from prior. The heart is enlarged. There some interstitial opacities in the lower lungs, unchanged. Biapical pleural-parenchymal scarring again noted. No pneumothorax or acute fracture. Osseous structures are stable. IMPRESSION: 1. Moderate right pleural effusion is stable. Small left pleural effusion has decreased from prior. 2. Cardiomegaly with interstitial opacities in the lower lungs, unchanged. Electronically Signed   By: Darliss Cheney M.D.   On: 01/31/2023 21:41   DG Chest 1 View  Result Date: 01/30/2023 CLINICAL DATA:  Shortness of breath EXAM: CHEST  1 VIEW COMPARISON:  Chest x-ray 12/27/2022 FINDINGS: There are small bilateral pleural effusions. There is cardiomegaly and central pulmonary vascular congestion. No evidence for pneumothorax or acute fracture. IMPRESSION: 1. Cardiomegaly and central pulmonary vascular congestion. 2. Small bilateral pleural effusions. Electronically Signed   By: Darliss Cheney M.D.   On: 01/30/2023 20:18    Microbiology: Recent Results (from the past 240 hour(s))  Resp panel by RT-PCR (RSV, Flu A&B, Covid) Anterior Nasal Swab     Status: None   Collection Time: 01/30/23  5:43 PM   Specimen: Anterior Nasal Swab  Result Value Ref Range Status   SARS Coronavirus 2 by RT PCR NEGATIVE NEGATIVE Final   Influenza A by PCR NEGATIVE NEGATIVE Final   Influenza B by PCR NEGATIVE NEGATIVE Final     Comment: (NOTE) The Xpert Xpress SARS-CoV-2/FLU/RSV plus assay is intended as an aid in the diagnosis of influenza from Nasopharyngeal swab specimens and should not be used as a sole basis for treatment. Nasal washings and aspirates are unacceptable for Xpert Xpress SARS-CoV-2/FLU/RSV testing.  Fact Sheet for Patients: BloggerCourse.com  Fact Sheet for Healthcare Providers: SeriousBroker.it  This test is not yet approved or cleared by the [place] FDA and has been authorized for detection and/or diagnosis of SARS-CoV-2 by FDA under an Emergency Use Authorization (EUA). This EUA will remain in effect (meaning this test can be used) for the duration of the COVID-19 declaration under Section 564(b)(1) of the Act, 21 U.S.C. section 360bbb-3(b)(1), unless the authorization is terminated or revoked.     Resp Syncytial Virus by PCR NEGATIVE NEGATIVE Final    Comment: (NOTE) Fact Sheet for Patients: BloggerCourse.com  Fact Sheet for Healthcare Providers: SeriousBroker.it  This test is not yet approved or cleared by the [place] FDA and has been authorized for detection and/or diagnosis of SARS-CoV-2 by  FDA under an Emergency Use Authorization (EUA). This EUA will remain in effect (meaning this test can be used) for the duration of the COVID-19 declaration under Section 564(b)(1) of the Act, 21 U.S.C. section 360bbb-3(b)(1), unless the authorization is terminated or revoked.  Performed at [facility], [address] [place], [place] [address]   Surgical pcr screen     Status: None   Collection Time: 02/01/23  8:36 AM   Specimen: Nasal Mucosa; Nasal Swab  Result Value Ref Range Status   MRSA, PCR NEGATIVE NEGATIVE Final   Staphylococcus aureus NEGATIVE NEGATIVE Final    Comment: (NOTE) The Xpert SA Assay (FDA approved for NASAL specimens in patients 22 years of  age and older), is one component of a comprehensive surveillance program. It is not intended to diagnose infection nor to guide or monitor treatment. Performed at [facility], [address] [place], [place] [address]      Labs: Basic Metabolic Panel: Recent Labs  Lab 01/31/23 0749 01/31/23 2020 02/01/23 0835 02/02/23 1521 02/03/23 0253 02/04/23 0238  NA 136 135 135 132* 134* 132*  K 3.7 4.2 4.8 3.6 3.6 3.7  CL 103 100 102 100 100 99  CO2 22 22 20* 20* 24 24  GLUCOSE 100* 120* 102* 149* 104* 94  BUN 23 27* 32* 38* 37* 28*  CREATININE 1.26* 1.39* 1.54* 1.76* 1.41* 1.20*  CALCIUM 8.3* 8.6* 8.3* 7.9* 7.8* 7.7*  MG 1.9  --   --  2.0  --   --    Liver Function Tests: Recent Labs  Lab 01/30/23 1743 02/01/23 0835  AST 95* 83*  ALT 84* 80*  ALKPHOS 79 72  BILITOT 1.1 1.2  PROT 7.3 6.9  ALBUMIN 2.8* 2.6*   No results for input(s): "LIPASE", "AMYLASE" in the last 168 hours. No results for input(s): "AMMONIA" in the last 168 hours. CBC: Recent Labs  Lab 01/31/23 2020 02/01/23 0430 02/02/23 0339 02/02/23 1521 02/03/23 0253 02/04/23 0238  WBC 12.7* 10.9* 14.3* 15.8* 14.3* 12.4*  NEUTROABS 11.6*  --   --   --   --   --   HGB 8.1* 7.2* 5.3* 9.4* 9.1* 9.1*  HCT 26.2* 23.6* 17.2* 28.9* 26.8* 27.6*  MCV 83.4 83.4 82.7 83.5 79.8* 83.4  PLT 374 332 299 267 231 245   Cardiac Enzymes: No results for input(s): "CKTOTAL", "CKMB", "CKMBINDEX", "TROPONINI" in the last 168 hours. BNP: BNP (last 3 results) Recent Labs    01/03/23 1221 01/30/23 1743 02/01/23 0430  BNP 460.8* 814.7* 327.6*    ProBNP (last 3 results) No results for input(s): "PROBNP" in the last 8760 hours.  CBG: No results for input(s): "GLUCAP" in the last 168 hours.     Signed:  Zannie Cove MD.  [facility] 02/05/2023, 11:10 AM

## 2023-02-05 NOTE — Plan of Care (Signed)

## 2023-02-05 NOTE — Progress Notes (Addendum)
Physical Therapy Treatment Patient Details Name: Laurie James MRN: [id number] DOB: 1946/03/31 Today's Date: 02/05/2023   History of Present Illness Pt is a 77 y.o. female presenting to [facility] 01/31/23 on the same day as being d/c from the hospital for falling out of the car onto R hip. Pt s/p R IM nail for intertrochanteric hip fx. Pt was admitted 01/30/23 with acute exacerbation of CHF and recurrent R pleural effusion. PMH significant for blindness, diastolic CHF, severe TR, PAF on Eliquis, CAD, COPD, CKD-3 and HTN    PT Comments  Pt supine in bed on entry, initially reluctant to get up with therapy because she is having difficulty having a bowel movement. Educated on benefits of upright posture. Pt agreeable to use Stedy to transfer to recliner. Pt is modA for coming to EoB, modAx2 to power up to [place]. Pt able to practice STS from elevated Stedy surface with min A for steadying. Pt with incontinence of bowels in standing and able to stand in [place] for ~5 min for pericare. D/c plans remain appropriate at this time.  PT will continue to follow acutely.   If plan is discharge home, recommend the following: A lot of help with walking and/or transfers;A lot of help with bathing/dressing/bathroom;Help with stairs or ramp for entrance   Can travel by private vehicle     No  Equipment Recommendations  None recommended by PT    Recommendations for Other Services       Precautions / Restrictions Precautions Precautions: Fall Restrictions Weight Bearing Restrictions: Yes RLE Weight Bearing: Weight bearing as tolerated     Mobility  Bed Mobility Overal bed mobility: Needs Assistance Bed Mobility: Supine to Sit     Supine to sit: Mod assist, +2 for physical assistance, HOB elevated, Used rails     General bed mobility comments: requires assist for managing LE off bed, vc for use of rail to help pull to EoB, PT provided modA for use of pad to swivel hips and bring to EoB     Transfers Overall transfer level: Needs assistance Equipment used: Ambulation equipment used Transfers: Sit to/from Stand, Bed to chair/wheelchair/BSC Sit to Stand: Mod assist, Min assist, +2 physical assistance           General transfer comment: modAx2 to come to upright in [place], where pt recieved pericare for loose stool, practiced 2x STS with min A from higher Stedy pads where she became incontinent of stool again and was able to again stand for pericare Transfer via Lift Equipment: Stedy  Ambulation/Gait               General Gait Details: deferred due to incontinence       Balance Overall balance assessment: Needs assistance, Mild deficits observed, not formally tested Sitting-balance support: No upper extremity supported, Feet supported Sitting balance-Leahy Scale: Good     Standing balance support: Bilateral upper extremity supported, During functional activity, Reliant on assistive device for balance Standing balance-Leahy Scale: Poor Standing balance comment: reliant on UE support                            Cognition Arousal: Alert Behavior During Therapy: WFL for tasks assessed/performed Overall Cognitive Status: Within Functional Limits for tasks assessed  General Comments General comments (skin integrity, edema, etc.): VSS on RA, reports increased fatigue with standing      Pertinent Vitals/Pain Pain Assessment Pain Assessment: Faces Faces Pain Scale: Hurts even more Pain Location: R LE, and bottom with pericare Pain Descriptors / Indicators: Aching, Discomfort Pain Intervention(s): Limited activity within patient's tolerance, Monitored during session, Repositioned     PT Goals (current goals can now be found in the care plan section) Acute Rehab PT Goals Patient Stated Goal: to get better PT Goal Formulation: With patient Time For Goal Achievement:  02/16/23 Potential to Achieve Goals: Good Progress towards PT goals: Progressing toward goals    Frequency    Min 1X/week       AM-PAC PT "6 Clicks" Mobility   Outcome Measure  Help needed turning from your back to your side while in a flat bed without using bedrails?: A Lot Help needed moving from lying on your back to sitting on the side of a flat bed without using bedrails?: A Lot Help needed moving to and from a bed to a chair (including a wheelchair)?: Total Help needed standing up from a chair using your arms (e.g., wheelchair or bedside chair)?: Total Help needed to walk in hospital room?: Total Help needed climbing 3-5 steps with a railing? : Total 6 Click Score: 8    End of Session Equipment Utilized During Treatment: Gait belt Activity Tolerance: Patient limited by fatigue;Patient limited by pain Patient left: in chair;with call bell/phone within reach;with chair alarm set;with family/visitor present Nurse Communication: Mobility status (how to transfer pt back) PT Visit Diagnosis: Unsteadiness on feet (R26.81);Muscle weakness (generalized) (M62.81)     Time: 5409-8119 PT Time Calculation (min) (ACUTE ONLY): 31 min  Charges:    $Therapeutic Activity: 23-37 mins PT General Charges $$ ACUTE PT VISIT: 1 Visit                     Keyatta Tolles B. Beverely Risen PT, DPT Acute Rehabilitation Services Please use secure chat or  Call Office [phone]    Elon Alas [facility] 02/05/2023, 12:34 PM

## 2023-02-05 NOTE — Progress Notes (Signed)
Report given to [facility] staff nurse at [employer]. All questions and concerns were fully answered.

## 2023-02-06 DIAGNOSIS — I361 Nonrheumatic tricuspid (valve) insufficiency: Secondary | ICD-10-CM | POA: Diagnosis not present

## 2023-02-06 DIAGNOSIS — Z7901 Long term (current) use of anticoagulants: Secondary | ICD-10-CM | POA: Diagnosis not present

## 2023-02-06 DIAGNOSIS — Z66 Do not resuscitate: Secondary | ICD-10-CM | POA: Diagnosis not present

## 2023-02-06 DIAGNOSIS — I480 Paroxysmal atrial fibrillation: Secondary | ICD-10-CM | POA: Diagnosis not present

## 2023-02-06 DIAGNOSIS — J441 Chronic obstructive pulmonary disease with (acute) exacerbation: Secondary | ICD-10-CM | POA: Diagnosis not present

## 2023-02-06 DIAGNOSIS — S7290XA Unspecified fracture of unspecified femur, initial encounter for closed fracture: Secondary | ICD-10-CM | POA: Diagnosis not present

## 2023-02-06 DIAGNOSIS — I5030 Unspecified diastolic (congestive) heart failure: Secondary | ICD-10-CM | POA: Diagnosis not present

## 2023-02-06 DIAGNOSIS — H3530 Unspecified macular degeneration: Secondary | ICD-10-CM | POA: Diagnosis not present

## 2023-02-06 DIAGNOSIS — N1831 Chronic kidney disease, stage 3a: Secondary | ICD-10-CM | POA: Diagnosis not present

## 2023-02-07 DIAGNOSIS — S81802A Unspecified open wound, left lower leg, initial encounter: Secondary | ICD-10-CM | POA: Diagnosis not present

## 2023-02-07 DIAGNOSIS — S81801A Unspecified open wound, right lower leg, initial encounter: Secondary | ICD-10-CM | POA: Diagnosis not present

## 2023-02-07 DIAGNOSIS — S61200A Unspecified open wound of right index finger without damage to nail, initial encounter: Secondary | ICD-10-CM | POA: Diagnosis not present

## 2023-02-07 DIAGNOSIS — S41102A Unspecified open wound of left upper arm, initial encounter: Secondary | ICD-10-CM | POA: Diagnosis not present

## 2023-02-10 DIAGNOSIS — N1831 Chronic kidney disease, stage 3a: Secondary | ICD-10-CM | POA: Diagnosis not present

## 2023-02-10 DIAGNOSIS — R748 Abnormal levels of other serum enzymes: Secondary | ICD-10-CM | POA: Diagnosis not present

## 2023-02-14 DIAGNOSIS — S41102A Unspecified open wound of left upper arm, initial encounter: Secondary | ICD-10-CM | POA: Diagnosis not present

## 2023-02-14 DIAGNOSIS — S81801A Unspecified open wound, right lower leg, initial encounter: Secondary | ICD-10-CM | POA: Diagnosis not present

## 2023-02-14 DIAGNOSIS — S61200A Unspecified open wound of right index finger without damage to nail, initial encounter: Secondary | ICD-10-CM | POA: Diagnosis not present

## 2023-02-14 DIAGNOSIS — S81802A Unspecified open wound, left lower leg, initial encounter: Secondary | ICD-10-CM | POA: Diagnosis not present

## 2023-02-16 DIAGNOSIS — S72141A Displaced intertrochanteric fracture of right femur, initial encounter for closed fracture: Secondary | ICD-10-CM | POA: Diagnosis not present

## 2023-02-21 DIAGNOSIS — S81801A Unspecified open wound, right lower leg, initial encounter: Secondary | ICD-10-CM | POA: Diagnosis not present

## 2023-02-21 DIAGNOSIS — S81802A Unspecified open wound, left lower leg, initial encounter: Secondary | ICD-10-CM | POA: Diagnosis not present

## 2023-02-21 DIAGNOSIS — S41102A Unspecified open wound of left upper arm, initial encounter: Secondary | ICD-10-CM | POA: Diagnosis not present

## 2023-02-23 ENCOUNTER — Encounter (HOSPITAL_COMMUNITY): Payer: Medicare PPO

## 2023-02-23 ENCOUNTER — Telehealth (HOSPITAL_COMMUNITY): Payer: Self-pay | Admitting: Cardiology

## 2023-02-23 NOTE — Telephone Encounter (Signed)
Daughter called to request appt for pt Reports pt is currently in SNF after breaking her hip last month  Family has concerns with BLE edema, reports there is water under the skin at her feet and swelling has rose to knees. Daughter would like follow up to discuss despite SNF provider increasing diuretic    Fu 11/21 @ 12

## 2023-02-27 DIAGNOSIS — D631 Anemia in chronic kidney disease: Secondary | ICD-10-CM | POA: Diagnosis not present

## 2023-02-27 DIAGNOSIS — I872 Venous insufficiency (chronic) (peripheral): Secondary | ICD-10-CM | POA: Diagnosis not present

## 2023-02-27 DIAGNOSIS — S90922D Unspecified superficial injury of left foot, subsequent encounter: Secondary | ICD-10-CM | POA: Diagnosis not present

## 2023-02-27 DIAGNOSIS — I361 Nonrheumatic tricuspid (valve) insufficiency: Secondary | ICD-10-CM | POA: Diagnosis not present

## 2023-02-27 DIAGNOSIS — D62 Acute posthemorrhagic anemia: Secondary | ICD-10-CM | POA: Diagnosis not present

## 2023-02-27 DIAGNOSIS — N1831 Chronic kidney disease, stage 3a: Secondary | ICD-10-CM | POA: Diagnosis not present

## 2023-02-27 DIAGNOSIS — S80921D Unspecified superficial injury of right lower leg, subsequent encounter: Secondary | ICD-10-CM | POA: Diagnosis not present

## 2023-02-27 DIAGNOSIS — S72141D Displaced intertrochanteric fracture of right femur, subsequent encounter for closed fracture with routine healing: Secondary | ICD-10-CM | POA: Diagnosis not present

## 2023-02-27 DIAGNOSIS — I5032 Chronic diastolic (congestive) heart failure: Secondary | ICD-10-CM | POA: Diagnosis not present

## 2023-02-28 DIAGNOSIS — I5032 Chronic diastolic (congestive) heart failure: Secondary | ICD-10-CM | POA: Diagnosis not present

## 2023-02-28 DIAGNOSIS — I872 Venous insufficiency (chronic) (peripheral): Secondary | ICD-10-CM | POA: Diagnosis not present

## 2023-02-28 DIAGNOSIS — N1831 Chronic kidney disease, stage 3a: Secondary | ICD-10-CM | POA: Diagnosis not present

## 2023-02-28 DIAGNOSIS — S90922D Unspecified superficial injury of left foot, subsequent encounter: Secondary | ICD-10-CM | POA: Diagnosis not present

## 2023-02-28 DIAGNOSIS — S80921D Unspecified superficial injury of right lower leg, subsequent encounter: Secondary | ICD-10-CM | POA: Diagnosis not present

## 2023-02-28 DIAGNOSIS — S72141D Displaced intertrochanteric fracture of right femur, subsequent encounter for closed fracture with routine healing: Secondary | ICD-10-CM | POA: Diagnosis not present

## 2023-02-28 DIAGNOSIS — D62 Acute posthemorrhagic anemia: Secondary | ICD-10-CM | POA: Diagnosis not present

## 2023-02-28 DIAGNOSIS — D631 Anemia in chronic kidney disease: Secondary | ICD-10-CM | POA: Diagnosis not present

## 2023-02-28 DIAGNOSIS — I361 Nonrheumatic tricuspid (valve) insufficiency: Secondary | ICD-10-CM | POA: Diagnosis not present

## 2023-03-01 ENCOUNTER — Ambulatory Visit (HOSPITAL_COMMUNITY)
Admission: RE | Admit: 2023-03-01 | Discharge: 2023-03-01 | Disposition: A | Discharge status: Home / Self Care | Payer: Medicare PPO | Source: Ambulatory Visit | Attending: Adult Health | Admitting: Adult Health

## 2023-03-01 ENCOUNTER — Encounter (HOSPITAL_COMMUNITY): Payer: Self-pay

## 2023-03-01 VITALS — BP 110/66 | HR 56

## 2023-03-01 DIAGNOSIS — I5022 Chronic systolic (congestive) heart failure: Secondary | ICD-10-CM | POA: Diagnosis not present

## 2023-03-01 DIAGNOSIS — I5032 Chronic diastolic (congestive) heart failure: Secondary | ICD-10-CM

## 2023-03-01 DIAGNOSIS — E875 Hyperkalemia: Secondary | ICD-10-CM | POA: Diagnosis not present

## 2023-03-01 DIAGNOSIS — I480 Paroxysmal atrial fibrillation: Secondary | ICD-10-CM | POA: Insufficient documentation

## 2023-03-01 DIAGNOSIS — M810 Age-related osteoporosis without current pathological fracture: Secondary | ICD-10-CM | POA: Diagnosis not present

## 2023-03-01 DIAGNOSIS — E039 Hypothyroidism, unspecified: Secondary | ICD-10-CM | POA: Diagnosis not present

## 2023-03-01 DIAGNOSIS — R9431 Abnormal electrocardiogram [ECG] [EKG]: Secondary | ICD-10-CM | POA: Diagnosis not present

## 2023-03-01 DIAGNOSIS — Z7901 Long term (current) use of anticoagulants: Secondary | ICD-10-CM | POA: Diagnosis not present

## 2023-03-01 DIAGNOSIS — I130 Hypertensive heart and chronic kidney disease with heart failure and stage 1 through stage 4 chronic kidney disease, or unspecified chronic kidney disease: Secondary | ICD-10-CM | POA: Diagnosis not present

## 2023-03-01 DIAGNOSIS — N1831 Chronic kidney disease, stage 3a: Secondary | ICD-10-CM | POA: Diagnosis not present

## 2023-03-01 DIAGNOSIS — D631 Anemia in chronic kidney disease: Secondary | ICD-10-CM | POA: Diagnosis not present

## 2023-03-01 DIAGNOSIS — S80921D Unspecified superficial injury of right lower leg, subsequent encounter: Secondary | ICD-10-CM | POA: Diagnosis not present

## 2023-03-01 DIAGNOSIS — I4891 Unspecified atrial fibrillation: Secondary | ICD-10-CM | POA: Diagnosis not present

## 2023-03-01 DIAGNOSIS — S72141D Displaced intertrochanteric fracture of right femur, subsequent encounter for closed fracture with routine healing: Secondary | ICD-10-CM | POA: Diagnosis not present

## 2023-03-01 DIAGNOSIS — I110 Hypertensive heart disease with heart failure: Secondary | ICD-10-CM | POA: Diagnosis present

## 2023-03-01 DIAGNOSIS — S72001D Fracture of unspecified part of neck of right femur, subsequent encounter for closed fracture with routine healing: Secondary | ICD-10-CM | POA: Insufficient documentation

## 2023-03-01 DIAGNOSIS — I872 Venous insufficiency (chronic) (peripheral): Secondary | ICD-10-CM | POA: Diagnosis not present

## 2023-03-01 DIAGNOSIS — I361 Nonrheumatic tricuspid (valve) insufficiency: Secondary | ICD-10-CM | POA: Diagnosis not present

## 2023-03-01 DIAGNOSIS — J90 Pleural effusion, not elsewhere classified: Secondary | ICD-10-CM | POA: Diagnosis not present

## 2023-03-01 DIAGNOSIS — N1832 Chronic kidney disease, stage 3b: Secondary | ICD-10-CM | POA: Insufficient documentation

## 2023-03-01 DIAGNOSIS — I428 Other cardiomyopathies: Secondary | ICD-10-CM | POA: Diagnosis not present

## 2023-03-01 DIAGNOSIS — Z23 Encounter for immunization: Secondary | ICD-10-CM | POA: Diagnosis not present

## 2023-03-01 DIAGNOSIS — T148XXD Other injury of unspecified body region, subsequent encounter: Secondary | ICD-10-CM | POA: Diagnosis not present

## 2023-03-01 DIAGNOSIS — I5023 Acute on chronic systolic (congestive) heart failure: Secondary | ICD-10-CM | POA: Diagnosis not present

## 2023-03-01 DIAGNOSIS — Z8781 Personal history of (healed) traumatic fracture: Secondary | ICD-10-CM | POA: Diagnosis not present

## 2023-03-01 DIAGNOSIS — D62 Acute posthemorrhagic anemia: Secondary | ICD-10-CM | POA: Diagnosis not present

## 2023-03-01 DIAGNOSIS — D6869 Other thrombophilia: Secondary | ICD-10-CM | POA: Diagnosis not present

## 2023-03-01 DIAGNOSIS — I272 Pulmonary hypertension, unspecified: Secondary | ICD-10-CM | POA: Diagnosis not present

## 2023-03-01 DIAGNOSIS — J449 Chronic obstructive pulmonary disease, unspecified: Secondary | ICD-10-CM | POA: Diagnosis not present

## 2023-03-01 DIAGNOSIS — S90922D Unspecified superficial injury of left foot, subsequent encounter: Secondary | ICD-10-CM | POA: Diagnosis not present

## 2023-03-01 DIAGNOSIS — R600 Localized edema: Secondary | ICD-10-CM | POA: Diagnosis not present

## 2023-03-01 LAB — BASIC METABOLIC PANEL
Anion gap: 8 (ref 5–15)
BUN: 18 mg/dL (ref 8–23)
CO2: 23 mmol/L (ref 22–32)
Calcium: 8.1 mg/dL — ABNORMAL LOW (ref 8.9–10.3)
Chloride: 102 mmol/L (ref 98–111)
Creatinine, Ser: 1.27 mg/dL — ABNORMAL HIGH (ref 0.44–1.00)
GFR, Estimated: 44 mL/min — ABNORMAL LOW (ref >60)
Glucose, Bld: 89 mg/dL (ref 70–99)
Potassium: 4.3 mmol/L (ref 3.5–5.1)
Sodium: 133 mmol/L — ABNORMAL LOW (ref 135–145)

## 2023-03-01 MED ORDER — FUROSEMIDE 20 MG PO TABS: 40.0000 mg | ORAL_TABLET | ORAL | 5 refills | Status: DC

## 2023-03-01 NOTE — Patient Instructions (Signed)
Labs done today. We will contact you only if your labs are abnormal.  INCREASE Lasix to 40mg  (2 tablets) by mouth every Monday, Wednesday and Friday  No other medication changes were made. Please continue all current medications as prescribed.  Your physician recommends that you schedule a follow-up appointment in: 3 weeks  If you have any questions or concerns before your next appointment please send [place] a message through [place] or call our office at [phone].    TO LEAVE A MESSAGE FOR THE NURSE SELECT OPTION 2, PLEASE LEAVE A MESSAGE INCLUDING: YOUR NAME DATE OF BIRTH CALL BACK NUMBER REASON FOR CALL**this is important as we prioritize the call backs  YOU WILL RECEIVE A CALL BACK THE SAME DAY AS LONG AS YOU CALL BEFORE 4:00 PM   Do the following things EVERYDAY: Weigh yourself in the morning before breakfast. Write it down and keep it in a log. Take your medicines as prescribed Eat low salt foods--Limit salt (sodium) to 2000 mg per day.  Stay as active as you can everyday Limit all fluids for the day to less than 2 liters   At the [facility], you and your health needs are our priority. As part of our continuing mission to provide you with exceptional heart care, we have created designated Provider Care Teams. These Care Teams include your primary Cardiologist (physician) and Advanced Practice Providers (APPs- Physician Assistants and Nurse Practitioners) who all work together to provide you with the care you need, when you need it.   You may see any of the following providers on your designated Care Team at your next follow up: Dr Arvilla Meres Dr Marca Ancona Dr. Marcos Eke, NP Robbie Lis, [place] [facility] [place], [place] Brynda Peon, NP Karle Plumber, PharmD   Please be sure to bring in all your medications bottles to every appointment.    Thank you for choosing [facility]

## 2023-03-01 NOTE — Progress Notes (Signed)
[facility] Note    PCP: Georgann Housekeeper, MD Primary Cardiologist: Charlton Haws, MD  HF Cardiologist: Dr. Gala Romney  HPI: Laurie James is a 77 y.o. woman with severe COPD, blindness, malnutrition/frailty, HTN, systolic HF due to NICM (diagnosed 7/22) and atrial fibrillation.   Admitted 6/22 with new onset AF. EF normal and RV dilated. Cath with nonobstructive CAD. Sent home with [facility] and rate control w/ plans for outpatient DCCV after 3 weeks of a/c.   Readmitted 7/22 with acute HF -> cardiogenic shock. TEE EF 20% RV moderately down RA massively dilated. Heavy smoke in RA/LA and aortic root. DCCV canceled. Initial Co-ox 38% and required milrinone. Had right sided thoracentesis on 07/13 and subsequent CXRs showed some reaccumulation. Diuresed with IV lasix. Po lasix PRN at discharge.  D/c weight 99 lbs.  Echo 03/21/21: EF 55-60% RV dilated with normal function. Severe TR.   Admitted 8/23 with afib RVR in the 150's. Echo 8/23  showed EF: 40-45%, RV function mildly reduced. Treated with amio.   AF ablation 03/08/22   Seen 03/31/22  in ED for mild hemoptysis. CT chest ok. Now resolved.   1/24 R thoracentesis   Echo (05/31/22): EF 50-55% severe TR  Admitted 5/24 with ADHF and recurrent R pleural effusion. Underwent R thora 900cc - transudative).  RHC 09/05/22 RA 4 PA 43/10 (21) PCWP 11 PVR 6.5 Fick 2.6/1.9  Follow up 09/26/22 with Dr. Gala Romney, NYHA III, volume OK on Lasix 20 mg PRN. Struggled tolerating GDMT with AKI, so plan to remain off unless EF declines.  Admitted 7/24 with a/c dHF. BNP 1045, diuresed with IV lasix. Lt echo showed EF 55-60%, G2DD, RV ok. Severe TR. S/p R thora 10/28/22--> 1L fluid. She was discharged home, weight 105 lbs.  Admitted 9/24 with A/E COPD, possible CAP and moderate R and small L pleural effusions. Started on abx, s/p R sided thoracentesis 12/28/22, yielding 1.3 L hazy yellow fluid. Discharged home, weight 110 lbs.  Admitted 01/30/23 with  A/C HFpEF. Diuresed with IV lasix and transitioned to lasix 20 mg Mon-Wed-Fri. Discharged home 01/31/23. When she got home, she rushed to get out of the car and fell. She was admitted with a hip fracture. Underwent ORIF. Discharged to SNF on 02/05/23 .   Today she returns for HF follow up with her daughter and granddaughter. Complaining of lower extremity swelling. Just got discharged from SNF earlier this week. Overall feeling  ok but tired. Gets SOB with exertion. Denies PND/Orthopnea. Appetite ok. No fever or chills.She  has a hospital bed. Unable to stand and weigh due to recent hip surgery. Taking all medications. Using adult diapers. Lives with her husband. Daughter and granddaughter are helping her. Home health following.   Cardiac Studies: - Ltd Echo (7/24): EF 55-60%, G2DD, RV ok, severe TR.  - RHC (5/24): RA 4 PA 43/10 (21) PCWP 11 PVR 6.5 Fick 2.6/1.9  - Echo (2/24): EF 50-55%, severe TR  - Echo (8/23): EF 40-45%, grade II DD, RV mildly reduced, moderate TR  - Echo (6/23): EF 45-50%, RV ok, severe TR  - Echo (10/06/20): EF 55-60 %   - LHC (10/07/20) showed mild 40% segmental RCA stenosis as well as 50 to 60% second diagonal branch stenosis  RHC 6/22 RA 3 PA 17/2 (8) PCWP 7 PVR  0.25 WU Fick 2.6/2.0 (LVEF was low)  - TEE (7/22): EF 20% RV moderately down RA massively dilated. Heavy smoke in RA/LA and aortic root.   Past Medical History:  Diagnosis Date   AMD (age related macular degeneration)    COPD (chronic obstructive pulmonary disease) (HCC)    Depression    Hiatal hernia    Intermittent low back pain    Memory impairment    Midsternal chest pain    a. 12/2011 Cardiac CTA Ca++ score of 103.3 (80th %), LAD <50p/m, RCA 50-75.   Osteoporosis    PAF (paroxysmal atrial fibrillation) (HCC) 09/2020   Paroxysmal atrial fibrillation with RVR (HCC) 11/28/2021   Vitamin B 12 deficiency    Current Outpatient Medications  Medication Sig Dispense Refill   albuterol (VENTOLIN  HFA) 108 (90 Base) MCG/ACT inhaler Inhale 2 puffs into the lungs every 4 (four) hours as needed for wheezing.     amiodarone (PACERONE) 200 MG tablet Take 1 tablet (200 mg total) by mouth daily for 325 days, THEN 1 tablet (200 mg total) daily.     apixaban (ELIQUIS) 2.5 MG TABS tablet Take 1 tablet by mouth twice daily 60 tablet 11   atorvastatin (LIPITOR) 40 MG tablet Stop taking until you see your PCP and obtain repeat labwork. (Patient taking differently: Take 40 mg by mouth daily. Stop taking until you see your PCP and obtain repeat labwork.)     budesonide-formoterol (SYMBICORT) 80-4.5 MCG/ACT inhaler Inhale 2 puffs into the lungs in the morning and at bedtime. 1 each 0   cyanocobalamin (,VITAMIN B-12,) 1000 MCG/ML injection Inject 1,000 mcg into the muscle every 30 (thirty) days.  3   furosemide (LASIX) 20 MG tablet Take 1 tablet (20 mg total) by mouth every Monday, Wednesday, and Friday. Leg edema 30 tablet 6   nitroGLYCERIN (NITROSTAT) 0.4 MG SL tablet Place 1 tablet (0.4 mg total) under the tongue every 5 (five) minutes x 3 doses as needed for chest pain. 25 tablet 0   ondansetron (ZOFRAN) 4 MG tablet Take 4 mg by mouth 3 (three) times daily as needed for nausea or vomiting.     oxyCODONE (OXY IR/ROXICODONE) 5 MG immediate release tablet Take 1 tablet (5 mg total) by mouth every 6 (six) hours as needed for moderate pain (pain score 4-6). 10 tablet 0   pantoprazole (PROTONIX) 40 MG tablet Take 1 tablet (40 mg total) by mouth 2 (two) times daily. 180 tablet 3   potassium chloride (KLOR-CON) 10 MEQ tablet Take 1 tablet (10 mEq total) by mouth every Monday, Wednesday, and Friday. 20 tablet 6   senna-docusate (SENOKOT-S) 8.6-50 MG tablet Take 1 tablet by mouth at bedtime.     SYNTHROID 75 MCG tablet Take 75 mcg by mouth daily before breakfast.     No current facility-administered medications for this encounter.   Allergies  Allergen Reactions   Codeine Nausea And Vomiting and Other (See  Comments)    Upsets the stomach   Social History   Socioeconomic History   Marital status: Married    Spouse name: Not on file   Number of children: Not on file   Years of education: Not on file   Highest education level: Not on file  Occupational History   Occupation: retired  Tobacco Use   Smoking status: Former    Current packs/day: 0.00    Average packs/day: 1 pack/day for 65.0 years (65.0 ttl pk-yrs)    Types: Cigarettes    Start date: 09/1955    Quit date: 09/2020    Years since quitting: 2.4   Smokeless tobacco: Never   Tobacco comments:    Former smoker 04/04/22  Vaping Use  Vaping status: Never Used  Substance and Sexual Activity   Alcohol use: No    Alcohol/week: 0.0 standard drinks of alcohol    Comment: rarely   Drug use: No   Sexual activity: Not Currently  Other Topics Concern   Not on file  Social History Narrative   Not on file   Social Determinants of Health   Financial Resource Strain: Not on file  Food Insecurity: No Food Insecurity (02/02/2023)   Hunger Vital Sign    Worried About Running Out of Food in the Last Year: Never true    Ran Out of Food in the Last Year: Never true  Transportation Needs: No Transportation Needs (02/02/2023)   PRAPARE - Administrator, Civil Service (Medical): No    Lack of Transportation (Non-Medical): No  Physical Activity: Not on file  Stress: Not on file  Social Connections: Not on file  Intimate Partner Violence: Not At Risk (02/02/2023)   Humiliation, Afraid, Rape, and Kick questionnaire    Fear of Current or Ex-Partner: No    Emotionally Abused: No    Physically Abused: No    Sexually Abused: No   Family History  Problem Relation Age of Onset   Breast cancer Sister    Breast cancer Sister    BP 110/66   Pulse (!) 56   SpO2 98%   Wt Readings from Last 3 Encounters:  02/03/23 51.2 kg (112 lb 14 oz)  01/31/23 48.8 kg (107 lb 9.4 oz)  01/10/23 47.6 kg (105 lb)   General:  Elderly.  Frail. Arrived in a wheelchair. No resp difficulty HEENT: normal Neck: supple. JVP 8-9. Carotids 2+ bilat; no bruits. No lymphadenopathy or thryomegaly appreciated. Cor: PMI nondisplaced. Regular rate & rhythm. No rubs, gallops. +TR murmurs. Lungs: clear Abdomen: soft, nontender, nondistended. No hepatosplenomegaly. No bruits or masses. Good bowel sounds. Extremities: no cyanosis, clubbing, rash, R and L foot 2+ edema Kerlix noted on R and LLE  Neuro: alert & orientedx3, cranial nerves grossly intact. moves all 4 extremities w/o difficulty. Affect pleasant  EKG: Sins Brady 59 bpm personally checked.  ASSESSMENT & PLAN:  1. Chronic Systolic Heart Failure - NICM - Echo (6/22): EF 20% RV moderately down RA massively dilated. Tachymediated from rapid Afib.  - LHC w/ nonobstructive CAD - Echo (12/22): EF normalized w/ restoration and maintenance of NSR, 55-60%, RV normal - Echo (5/23): EF back down, 45-50% (recurrent Afib w/ CVR) - Limited echo (8/23): EF 40-45%, RV mildly reduced, L/R atria severely dilated. Mild MR, Mod TR - Echo 2/24 EF 50-55% RV ok severe TR - RHC 09/05/22 RA 4 PA 43/10 (21) PCWP 11 PVR 6.5 Fick 2.6/1.9 - Ltd echo (6/24): EF 55-60%, G2DD, RV ok, severe TR. - NYHA III. Volume elevated. Increase lasix to 40 mg M-W-F.  - Off Entresto, spiro and Jardiance with recurrent AKI and hyperkalemia; failed x 2.  Will not rechallenge unless EF drops back down.  - Needs to elevate lower extremities   2. PAH with severe TR - Echo 2/24 EF 50-55% RV ok severe TR - RHC 09/05/22 RA 4 PA 43/10 (21) PCWP 11 PVR 6.5 Fick 2.6/1.9 - Suspect primarily WHO Group 3 PH in setting of advanced COPD. Not candidate for selective pulmonary vasodilators - Dr. Gala Romney to review with Structural team regarding candidacy for TV clip with functional TR. (She is reluctant to go to [place])  3. PAF  - she does not tolerate Afib well (prior h/o tachy mediated  CM and cardiogenic shock). Long term  suppression w/ amiodarone not ideal w/ COPD.  - s/p AF RFA 11/23 - followed in AF Clinic.  - In SR today.  - Continue amio 200 mg daily.   Continue Eliquis 2.5 mg bid.   4. Recurrent R pleural effusion - s/p tap 1/24 and 5/24 (transudative) - Suspect hydro-pneumothorax in setting of probable cardiac cirrhosis (hepatic u/s 2022 suggestive of cirrhosis. CT 2024 no mention of cirrhosis) - s/p R thoracentesis 9/24 yielding 1.4 L  5. H/o AKI on CKD Stage IIIb and Hyperkalemia - Had AKI in 10/23 Entresto and Jardiance stopped - Check BMET   6. Lower extremity wounds Needs HH for wound care. Has f/u with PCP today   7. H/O Fall complicated by hip fracture, right.   Follow up in 3 weeks to reassess volume status. She is at high risk for readmissions due to frailty and severe deconditioning. Tonye Becket, NP  12:09 PM

## 2023-03-05 DIAGNOSIS — N1831 Chronic kidney disease, stage 3a: Secondary | ICD-10-CM | POA: Diagnosis not present

## 2023-03-05 DIAGNOSIS — I872 Venous insufficiency (chronic) (peripheral): Secondary | ICD-10-CM | POA: Diagnosis not present

## 2023-03-05 DIAGNOSIS — S90922D Unspecified superficial injury of left foot, subsequent encounter: Secondary | ICD-10-CM | POA: Diagnosis not present

## 2023-03-05 DIAGNOSIS — D631 Anemia in chronic kidney disease: Secondary | ICD-10-CM | POA: Diagnosis not present

## 2023-03-05 DIAGNOSIS — S80921D Unspecified superficial injury of right lower leg, subsequent encounter: Secondary | ICD-10-CM | POA: Diagnosis not present

## 2023-03-05 DIAGNOSIS — I361 Nonrheumatic tricuspid (valve) insufficiency: Secondary | ICD-10-CM | POA: Diagnosis not present

## 2023-03-05 DIAGNOSIS — I5032 Chronic diastolic (congestive) heart failure: Secondary | ICD-10-CM | POA: Diagnosis not present

## 2023-03-05 DIAGNOSIS — D62 Acute posthemorrhagic anemia: Secondary | ICD-10-CM | POA: Diagnosis not present

## 2023-03-05 DIAGNOSIS — S72141D Displaced intertrochanteric fracture of right femur, subsequent encounter for closed fracture with routine healing: Secondary | ICD-10-CM | POA: Diagnosis not present

## 2023-03-07 DIAGNOSIS — S72141D Displaced intertrochanteric fracture of right femur, subsequent encounter for closed fracture with routine healing: Secondary | ICD-10-CM | POA: Diagnosis not present

## 2023-03-07 DIAGNOSIS — I872 Venous insufficiency (chronic) (peripheral): Secondary | ICD-10-CM | POA: Diagnosis not present

## 2023-03-07 DIAGNOSIS — N1831 Chronic kidney disease, stage 3a: Secondary | ICD-10-CM | POA: Diagnosis not present

## 2023-03-07 DIAGNOSIS — S90922D Unspecified superficial injury of left foot, subsequent encounter: Secondary | ICD-10-CM | POA: Diagnosis not present

## 2023-03-07 DIAGNOSIS — I361 Nonrheumatic tricuspid (valve) insufficiency: Secondary | ICD-10-CM | POA: Diagnosis not present

## 2023-03-07 DIAGNOSIS — D631 Anemia in chronic kidney disease: Secondary | ICD-10-CM | POA: Diagnosis not present

## 2023-03-07 DIAGNOSIS — I5032 Chronic diastolic (congestive) heart failure: Secondary | ICD-10-CM | POA: Diagnosis not present

## 2023-03-07 DIAGNOSIS — S80921D Unspecified superficial injury of right lower leg, subsequent encounter: Secondary | ICD-10-CM | POA: Diagnosis not present

## 2023-03-07 DIAGNOSIS — D62 Acute posthemorrhagic anemia: Secondary | ICD-10-CM | POA: Diagnosis not present

## 2023-03-13 NOTE — Discharge Summary (Signed)
Physician Discharge Summary  Sanari Akhter Godfrey MWN:[id number] DOB: December 11, 1945 DOA: 01/31/2023   PCP: Georgann Housekeeper, MD   Admit date: 01/31/2023 Discharge date: 02/05/2023   Time spent: 45 minutes   Recommendations for Outpatient Follow-up:  SNF for short-term rehab, weightbearing as tolerated Orthopedics Dr. Susa Simmonds in 2 weeks for wound check, staple removal Cardiology Dr. Gala Romney in 1 month     Discharge Diagnoses:  Principal Problem:   Hip fracture [facility]) Active Problems:   Chronic diastolic CHF (congestive heart failure) (HCC)   Paroxysmal atrial fibrillation (HCC)   CAD (coronary artery disease)   Tricuspid regurgitation   Bradycardia   Malnutrition of moderate degree     Discharge Condition: Improved   Diet recommendation: Low-sodium, heart healthy        Filed Weights    01/31/23 1932 02/01/23 1939 02/03/23 0523  Weight: 49 kg 52.2 kg 51.2 kg      History of present illness:  77/F w diastolic CHF, severe TR, recurrent right pleural effusion, paroxysmal A-fib on Eliquis, CKD stage IIIa, hyperlipidemia, hypothyroidism, age-related macular degeneration/blindness, coronary artery disease, COPD, GERD who presented from home with complaint of right hip pain after fall.  She was discharged home on 10/22 after she was admitted for CHF exacerbation . While at home, she lost her balance and fell on her right hip.  X-ray of the right hip/pelvis showed acute commuted right femoral intratrochanteric fracture.  Chest x-ray showed moderate right pleural effusion, small left pleural effusion, cardiomegaly with unchanged interstitial opacities.  Orthopedics consulted,  -Underwent ORIF 10/24 -10/25, hemoglobin down to 5.3, transfused 2 units of PRBC   Hospital Course:  Acute comminuted right femoral intertrochanteric fracture:  -Following mechanical fall.  Orthopedics consulted,  -Underwent ORIF 10/24 -Pain control, restarted Eliquis -PT OT eval, SNF recommended for  short-term rehab, TOC consulted -Discharge planning, follow-up with Dr. Susa Simmonds orthopedics in 2 weeks for wound check and staple removal   Chronic diastolic CHF/severe TR: -Previously with systolic CHF then had EF recovery - Echo 7/24 showed EF of 55 to 60%, grade 2 diastolic dysfunction, severe TR.  Patient was recently admitted for diastolic CHF exacerbation and was discharged . -mild to moderate right pleural effusion noted on x-ray 10/23 but does not appear overtly volume overloaded diuresed with IV Lasix initially and then switched back to oral -Continue Lasix 20 Mg 3 days a week per home regimen -Cards notes reviewed, she has not tolerated multiple attempts at GDMT   Leukocytosis: -likely reactive.  No signs of infection   Acute blood loss anemia In the background of chronic normocytic anemia -Baseline hemoglobin around 7.5-8, was 7.2 on admission then 5.3 after surgery -transfused 2 units of PRBC, hemoglobin now 9.1, suspect 5.3 was likely erroneous -Resumed Eliquis   CKD stage IIIa:  -Stable, creatinine at baseline.   Coronary artery disease:  -Continue statin, Eliquis resumed   Paroxysmal A-fib: Currently in normal sinus rhythm.   -Continue amiodarone, Eliquis resumed     Code Status: DNR   Procedure10/24/2024 PROCEDURE: Cephalomedullary nail of right hip fracture   SURGEON: Dub Mikes, MD   Discharge Exam:     Vitals:    02/05/23 0757 02/05/23 0915  BP:   115/62  Pulse: 61 62  Resp: 18 18  Temp:   97.7 F (36.5 C)  SpO2: 91% 99%    General exam: Elderly pleasant female sitting up in bed, AAOx3 HEENT: No JVD CVS: S1-S2, regular rhythm Lungs: Decreased breath sounds at the bases Abdomen:  Soft, nontender, bowel sounds present Extremities: No edema, right hip with dressing Psychiatry:  Mood & affect appropriate.    Discharge Instructions       Allergies as of 02/05/2023         Reactions    Codeine Nausea And Vomiting, Other (See Comments)     Upsets the stomach            Medication List       TAKE these medications     albuterol 108 (90 Base) MCG/ACT inhaler Commonly known as: VENTOLIN HFA Inhale 2 puffs into the lungs every 4 (four) hours as needed for wheezing.    amiodarone 200 MG tablet Commonly known as: PACERONE Take 1 tablet (200 mg total) by mouth daily for 325 days, THEN 1 tablet (200 mg total) daily. Start taking on: February 05, 2023 What changed: See the new instructions.    atorvastatin 40 MG tablet Commonly known as: LIPITOR Stop taking until you see your PCP and obtain repeat labwork. What changed:  how much to take how to take this when to take this    budesonide-formoterol 80-4.5 MCG/ACT inhaler Commonly known as: Symbicort Inhale 2 puffs into the lungs in the morning and at bedtime.    cyanocobalamin 1000 MCG/ML injection Commonly known as: VITAMIN B12 Inject 1,000 mcg into the muscle every 30 (thirty) days.    Eliquis 2.5 MG Tabs tablet Generic drug: apixaban Take 1 tablet by mouth twice daily    furosemide 20 MG tablet Commonly known as: LASIX Take 1 tablet (20 mg total) by mouth every Monday, Wednesday, and Friday. Leg edema    mupirocin ointment 2 % Commonly known as: BACTROBAN Apply 1 Application topically daily.    nitroGLYCERIN 0.4 MG SL tablet Commonly known as: NITROSTAT Place 1 tablet (0.4 mg total) under the tongue every 5 (five) minutes x 3 doses as needed for chest pain.    ondansetron 4 MG tablet Commonly known as: ZOFRAN Take 4 mg by mouth 3 (three) times daily as needed for nausea or vomiting.    oxyCODONE 5 MG immediate release tablet Commonly known as: Oxy IR/ROXICODONE Take 1 tablet (5 mg total) by mouth every 6 (six) hours as needed for moderate pain (pain score 4-6).    pantoprazole 40 MG tablet Commonly known as: PROTONIX Take 1 tablet (40 mg total) by mouth 2 (two) times daily.    polyethylene glycol 17 g packet Commonly known as: MIRALAX /  GLYCOLAX Take 17 g by mouth daily as needed for up to 10 days.    potassium chloride 10 MEQ tablet Commonly known as: KLOR-CON Take 1 tablet (10 mEq total) by mouth every Monday, Wednesday, and Friday.    senna-docusate 8.6-50 MG tablet Commonly known as: Senokot-S Take 1 tablet by mouth at bedtime.    Synthroid 75 MCG tablet Generic drug: levothyroxine Take 75 mcg by mouth daily before breakfast.           Allergies       Allergies  Allergen Reactions   Codeine Nausea And Vomiting and Other (See Comments)      Upsets the stomach        Follow-up Information       [facility] Follow up on 02/23/2023.   Specialty: Cardiology Why: Follow up in the AHF clinic 02/23/23 1130 Contact information: [address] [place] [address] [phone]  The results of significant diagnostics from this hospitalization (including imaging, microbiology, ancillary and laboratory) are listed below for reference.     Significant Diagnostic Studies:  Imaging Results  DG FEMUR, MIN 2 VIEWS RIGHT   Result Date: 02/01/2023 CLINICAL DATA:  Known right intratrochanteric femoral fracture EXAM: RIGHT FEMUR 2 VIEWS COMPARISON:  Films from the previous day. FLUOROSCOPY TIME:  Radiation Exposure Index (as provided by the fluoroscopic device): 14.13 mGy If the device does not provide the exposure index: Fluoroscopy Time:  83 seconds Number of Acquired Images:  4 FINDINGS: Proximal medullary rod with fixation screw traversing the femoral neck is seen. Distal fixation screw is noted as well. Fracture fragments are in near anatomic alignment. IMPRESSION: ORIF of right femoral fracture. Electronically Signed   By: Alcide Clever M.D.   On: 02/01/2023 17:07    DG C-Arm 1-60 Min-No Report   Result Date: 02/01/2023 Fluoroscopy was utilized by the requesting physician.  No radiographic interpretation.    DG Hip  Unilat With Pelvis 2-3 Views Right   Result Date: 01/31/2023 CLINICAL DATA:  Fall EXAM: DG HIP (WITH OR WITHOUT PELVIS) 2-3V RIGHT COMPARISON:  None Available. FINDINGS: There is an acute comminuted right femoral intratrochanteric fracture. There is 1 shaft with superolateral displacement of the distal fracture fragment. There is no dislocation. IMPRESSION: Acute comminuted right femoral intratrochanteric fracture. Electronically Signed   By: Darliss Cheney M.D.   On: 01/31/2023 21:42    DG Chest Port 1 View   Result Date: 01/31/2023 CLINICAL DATA:  Fall EXAM: PORTABLE CHEST 1 VIEW COMPARISON:  Chest x-ray 01/30/2023 FINDINGS: Moderate right effusion is stable. Small left pleural effusion has decreased from prior. The heart is enlarged. There some interstitial opacities in the lower lungs, unchanged. Biapical pleural-parenchymal scarring again noted. No pneumothorax or acute fracture. Osseous structures are stable. IMPRESSION: 1. Moderate right pleural effusion is stable. Small left pleural effusion has decreased from prior. 2. Cardiomegaly with interstitial opacities in the lower lungs, unchanged. Electronically Signed   By: Darliss Cheney M.D.   On: 01/31/2023 21:41    DG Chest 1 View   Result Date: 01/30/2023 CLINICAL DATA:  Shortness of breath EXAM: CHEST  1 VIEW COMPARISON:  Chest x-ray 12/27/2022 FINDINGS: There are small bilateral pleural effusions. There is cardiomegaly and central pulmonary vascular congestion. No evidence for pneumothorax or acute fracture. IMPRESSION: 1. Cardiomegaly and central pulmonary vascular congestion. 2. Small bilateral pleural effusions. Electronically Signed   By: Darliss Cheney M.D.   On: 01/30/2023 20:18       Microbiology:        Recent Results (from the past 240 hour(s))  Resp panel by RT-PCR (RSV, Flu A&B, Covid) Anterior Nasal Swab     Status: None    Collection Time: 01/30/23  5:43 PM    Specimen: Anterior Nasal Swab  Result Value Ref Range Status     SARS Coronavirus 2 by RT PCR NEGATIVE NEGATIVE Final    Influenza A by PCR NEGATIVE NEGATIVE Final    Influenza B by PCR NEGATIVE NEGATIVE Final      Comment: (NOTE) The Xpert Xpress SARS-CoV-2/FLU/RSV plus assay is intended as an aid in the diagnosis of influenza from Nasopharyngeal swab specimens and should not be used as a sole basis for treatment. Nasal washings and aspirates are unacceptable for Xpert Xpress SARS-CoV-2/FLU/RSV testing.   Fact Sheet for Patients: BloggerCourse.com   Fact Sheet for Healthcare Providers: SeriousBroker.it   This test is not yet approved or cleared  by the [place] and has been authorized for detection and/or diagnosis of SARS-CoV-2 by FDA under an Emergency Use Authorization (EUA). This EUA will remain in effect (meaning this test can be used) for the duration of the COVID-19 declaration under Section 564(b)(1) of the Act, 21 U.S.C. section 360bbb-3(b)(1), unless the authorization is terminated or revoked.        Resp Syncytial Virus by PCR NEGATIVE NEGATIVE Final      Comment: (NOTE) Fact Sheet for Patients: BloggerCourse.com   Fact Sheet for Healthcare Providers: SeriousBroker.it   This test is not yet approved or cleared by the [place] FDA and has been authorized for detection and/or diagnosis of SARS-CoV-2 by FDA under an Emergency Use Authorization (EUA). This EUA will remain in effect (meaning this test can be used) for the duration of the COVID-19 declaration under Section 564(b)(1) of the Act, 21 U.S.C. section 360bbb-3(b)(1), unless the authorization is terminated or revoked.   Performed at [facility], [address] [place], [place] [address]    Surgical pcr screen     Status: None    Collection Time: 02/01/23  8:36 AM    Specimen: Nasal Mucosa; Nasal Swab  Result Value Ref Range Status    MRSA, PCR  NEGATIVE NEGATIVE Final    Staphylococcus aureus NEGATIVE NEGATIVE Final      Comment: (NOTE) The Xpert SA Assay (FDA approved for NASAL specimens in patients 64 years of age and older), is one component of a comprehensive surveillance program. It is not intended to diagnose infection nor to guide or monitor treatment. Performed at [facility], [address], [place], [place] [address]        Labs: Basic Metabolic Panel: Last Labs          Recent Labs  Lab 01/31/23 [phone] 01/31/23 2020 02/01/23 0835 02/02/23 1521 02/03/23 0253 02/04/23 0238  NA 136 135 135 132* 134* 132*  K 3.7 4.2 4.8 3.6 3.6 3.7  CL 103 100 102 100 100 99  CO2 22 22 20* 20* 24 24  GLUCOSE 100* 120* 102* 149* 104* 94  BUN 23 27* 32* 38* 37* 28*  CREATININE 1.26* 1.39* 1.54* 1.76* 1.41* 1.20*  CALCIUM 8.3* 8.6* 8.3* 7.9* 7.8* 7.7*  MG 1.9  --   --  2.0  --   --       Liver Function Tests: Last Labs      Recent Labs  Lab 01/30/23 1743 02/01/23 0835  AST 95* 83*  ALT 84* 80*  ALKPHOS 79 72  BILITOT 1.1 1.2  PROT 7.3 6.9  ALBUMIN 2.8* 2.6*      Last Labs  No results for input(s): "LIPASE", "AMYLASE" in the last 168 hours.   Last Labs  No results for input(s): "AMMONIA" in the last 168 hours.   CBC: Last Labs          Recent Labs  Lab 01/31/23 2020 02/01/23 0430 02/02/23 0339 02/02/23 1521 02/03/23 0253 02/04/23 0238  WBC 12.7* 10.9* 14.3* 15.8* 14.3* 12.4*  NEUTROABS 11.6*  --   --   --   --   --   HGB 8.1* 7.2* 5.3* 9.4* 9.1* 9.1*  HCT 26.2* 23.6* 17.2* 28.9* 26.8* 27.6*  MCV 83.4 83.4 82.7 83.5 79.8* 83.4  PLT 374 332 299 267 231 245      Cardiac Enzymes: Last Labs  No results for input(s): "CKTOTAL", "CKMB", "CKMBINDEX", "TROPONINI" in the last 168 hours.  BNP: BNP (last 3 results) Recent Labs (within last 365 days)       Recent Labs    01/03/23 1221 01/30/23 1743 02/01/23 0430  BNP 460.8* 814.7* 327.6*        ProBNP (last 3 results) Recent Labs  (within last 365 days)  No results for input(s): "PROBNP" in the last 8760 hours.     CBG: Last Labs  No results for input(s): "GLUCAP" in the last 168 hours.           Signed:   Zannie Cove MD.  [facility] 02/05/2023, 11:10 AM

## 2023-03-14 DIAGNOSIS — S80921D Unspecified superficial injury of right lower leg, subsequent encounter: Secondary | ICD-10-CM | POA: Diagnosis not present

## 2023-03-14 DIAGNOSIS — I872 Venous insufficiency (chronic) (peripheral): Secondary | ICD-10-CM | POA: Diagnosis not present

## 2023-03-14 DIAGNOSIS — D631 Anemia in chronic kidney disease: Secondary | ICD-10-CM | POA: Diagnosis not present

## 2023-03-14 DIAGNOSIS — S72141D Displaced intertrochanteric fracture of right femur, subsequent encounter for closed fracture with routine healing: Secondary | ICD-10-CM | POA: Diagnosis not present

## 2023-03-14 DIAGNOSIS — I5032 Chronic diastolic (congestive) heart failure: Secondary | ICD-10-CM | POA: Diagnosis not present

## 2023-03-14 DIAGNOSIS — N1831 Chronic kidney disease, stage 3a: Secondary | ICD-10-CM | POA: Diagnosis not present

## 2023-03-14 DIAGNOSIS — D62 Acute posthemorrhagic anemia: Secondary | ICD-10-CM | POA: Diagnosis not present

## 2023-03-14 DIAGNOSIS — S90922D Unspecified superficial injury of left foot, subsequent encounter: Secondary | ICD-10-CM | POA: Diagnosis not present

## 2023-03-14 DIAGNOSIS — I361 Nonrheumatic tricuspid (valve) insufficiency: Secondary | ICD-10-CM | POA: Diagnosis not present

## 2023-03-15 DIAGNOSIS — D62 Acute posthemorrhagic anemia: Secondary | ICD-10-CM | POA: Diagnosis not present

## 2023-03-15 DIAGNOSIS — I5032 Chronic diastolic (congestive) heart failure: Secondary | ICD-10-CM | POA: Diagnosis not present

## 2023-03-15 DIAGNOSIS — S72141D Displaced intertrochanteric fracture of right femur, subsequent encounter for closed fracture with routine healing: Secondary | ICD-10-CM | POA: Diagnosis not present

## 2023-03-15 DIAGNOSIS — D631 Anemia in chronic kidney disease: Secondary | ICD-10-CM | POA: Diagnosis not present

## 2023-03-15 DIAGNOSIS — I361 Nonrheumatic tricuspid (valve) insufficiency: Secondary | ICD-10-CM | POA: Diagnosis not present

## 2023-03-15 DIAGNOSIS — S90922D Unspecified superficial injury of left foot, subsequent encounter: Secondary | ICD-10-CM | POA: Diagnosis not present

## 2023-03-15 DIAGNOSIS — S80921D Unspecified superficial injury of right lower leg, subsequent encounter: Secondary | ICD-10-CM | POA: Diagnosis not present

## 2023-03-15 DIAGNOSIS — N1831 Chronic kidney disease, stage 3a: Secondary | ICD-10-CM | POA: Diagnosis not present

## 2023-03-15 DIAGNOSIS — I872 Venous insufficiency (chronic) (peripheral): Secondary | ICD-10-CM | POA: Diagnosis not present

## 2023-03-16 DIAGNOSIS — D62 Acute posthemorrhagic anemia: Secondary | ICD-10-CM | POA: Diagnosis not present

## 2023-03-16 DIAGNOSIS — I361 Nonrheumatic tricuspid (valve) insufficiency: Secondary | ICD-10-CM | POA: Diagnosis not present

## 2023-03-16 DIAGNOSIS — I5032 Chronic diastolic (congestive) heart failure: Secondary | ICD-10-CM | POA: Diagnosis not present

## 2023-03-16 DIAGNOSIS — S72141D Displaced intertrochanteric fracture of right femur, subsequent encounter for closed fracture with routine healing: Secondary | ICD-10-CM | POA: Diagnosis not present

## 2023-03-16 DIAGNOSIS — D631 Anemia in chronic kidney disease: Secondary | ICD-10-CM | POA: Diagnosis not present

## 2023-03-16 DIAGNOSIS — S80921D Unspecified superficial injury of right lower leg, subsequent encounter: Secondary | ICD-10-CM | POA: Diagnosis not present

## 2023-03-16 DIAGNOSIS — I872 Venous insufficiency (chronic) (peripheral): Secondary | ICD-10-CM | POA: Diagnosis not present

## 2023-03-16 DIAGNOSIS — S90922D Unspecified superficial injury of left foot, subsequent encounter: Secondary | ICD-10-CM | POA: Diagnosis not present

## 2023-03-16 DIAGNOSIS — N1831 Chronic kidney disease, stage 3a: Secondary | ICD-10-CM | POA: Diagnosis not present

## 2023-03-20 DIAGNOSIS — D631 Anemia in chronic kidney disease: Secondary | ICD-10-CM | POA: Diagnosis not present

## 2023-03-20 DIAGNOSIS — I361 Nonrheumatic tricuspid (valve) insufficiency: Secondary | ICD-10-CM | POA: Diagnosis not present

## 2023-03-20 DIAGNOSIS — I872 Venous insufficiency (chronic) (peripheral): Secondary | ICD-10-CM | POA: Diagnosis not present

## 2023-03-20 DIAGNOSIS — N1831 Chronic kidney disease, stage 3a: Secondary | ICD-10-CM | POA: Diagnosis not present

## 2023-03-20 DIAGNOSIS — S72141D Displaced intertrochanteric fracture of right femur, subsequent encounter for closed fracture with routine healing: Secondary | ICD-10-CM | POA: Diagnosis not present

## 2023-03-20 DIAGNOSIS — D62 Acute posthemorrhagic anemia: Secondary | ICD-10-CM | POA: Diagnosis not present

## 2023-03-20 DIAGNOSIS — S90922D Unspecified superficial injury of left foot, subsequent encounter: Secondary | ICD-10-CM | POA: Diagnosis not present

## 2023-03-20 DIAGNOSIS — S80921D Unspecified superficial injury of right lower leg, subsequent encounter: Secondary | ICD-10-CM | POA: Diagnosis not present

## 2023-03-20 DIAGNOSIS — I5032 Chronic diastolic (congestive) heart failure: Secondary | ICD-10-CM | POA: Diagnosis not present

## 2023-03-23 DIAGNOSIS — S72141D Displaced intertrochanteric fracture of right femur, subsequent encounter for closed fracture with routine healing: Secondary | ICD-10-CM | POA: Diagnosis not present

## 2023-03-23 DIAGNOSIS — I5032 Chronic diastolic (congestive) heart failure: Secondary | ICD-10-CM | POA: Diagnosis not present

## 2023-03-23 DIAGNOSIS — N1831 Chronic kidney disease, stage 3a: Secondary | ICD-10-CM | POA: Diagnosis not present

## 2023-03-23 DIAGNOSIS — I361 Nonrheumatic tricuspid (valve) insufficiency: Secondary | ICD-10-CM | POA: Diagnosis not present

## 2023-03-23 DIAGNOSIS — S90922D Unspecified superficial injury of left foot, subsequent encounter: Secondary | ICD-10-CM | POA: Diagnosis not present

## 2023-03-23 DIAGNOSIS — I872 Venous insufficiency (chronic) (peripheral): Secondary | ICD-10-CM | POA: Diagnosis not present

## 2023-03-23 DIAGNOSIS — S80921D Unspecified superficial injury of right lower leg, subsequent encounter: Secondary | ICD-10-CM | POA: Diagnosis not present

## 2023-03-23 DIAGNOSIS — D62 Acute posthemorrhagic anemia: Secondary | ICD-10-CM | POA: Diagnosis not present

## 2023-03-23 DIAGNOSIS — D631 Anemia in chronic kidney disease: Secondary | ICD-10-CM | POA: Diagnosis not present

## 2023-03-24 ENCOUNTER — Other Ambulatory Visit: Payer: Self-pay

## 2023-03-24 ENCOUNTER — Inpatient Hospital Stay (HOSPITAL_COMMUNITY)
Admission: EM | Admit: 2023-03-24 | Discharge: 2023-04-02 | DRG: 291 | Disposition: A | Discharge status: Home Health | Payer: Medicare HMO | Attending: Internal Medicine | Admitting: Internal Medicine

## 2023-03-24 ENCOUNTER — Emergency Department (HOSPITAL_COMMUNITY): Payer: Medicare HMO

## 2023-03-24 DIAGNOSIS — I5033 Acute on chronic diastolic (congestive) heart failure: Principal | ICD-10-CM | POA: Diagnosis present

## 2023-03-24 DIAGNOSIS — J439 Emphysema, unspecified: Secondary | ICD-10-CM | POA: Diagnosis present

## 2023-03-24 DIAGNOSIS — Z66 Do not resuscitate: Secondary | ICD-10-CM | POA: Diagnosis present

## 2023-03-24 DIAGNOSIS — Z885 Allergy status to narcotic agent status: Secondary | ICD-10-CM

## 2023-03-24 DIAGNOSIS — Z7401 Bed confinement status: Secondary | ICD-10-CM

## 2023-03-24 DIAGNOSIS — S72001D Fracture of unspecified part of neck of right femur, subsequent encounter for closed fracture with routine healing: Secondary | ICD-10-CM | POA: Diagnosis not present

## 2023-03-24 DIAGNOSIS — Z91148 Patient's other noncompliance with medication regimen for other reason: Secondary | ICD-10-CM

## 2023-03-24 DIAGNOSIS — I071 Rheumatic tricuspid insufficiency: Secondary | ICD-10-CM | POA: Diagnosis present

## 2023-03-24 DIAGNOSIS — G4733 Obstructive sleep apnea (adult) (pediatric): Secondary | ICD-10-CM | POA: Diagnosis present

## 2023-03-24 DIAGNOSIS — M109 Gout, unspecified: Secondary | ICD-10-CM | POA: Diagnosis present

## 2023-03-24 DIAGNOSIS — Z7951 Long term (current) use of inhaled steroids: Secondary | ICD-10-CM

## 2023-03-24 DIAGNOSIS — M419 Scoliosis, unspecified: Secondary | ICD-10-CM | POA: Diagnosis present

## 2023-03-24 DIAGNOSIS — I272 Pulmonary hypertension, unspecified: Secondary | ICD-10-CM | POA: Diagnosis present

## 2023-03-24 DIAGNOSIS — I2583 Coronary atherosclerosis due to lipid rich plaque: Secondary | ICD-10-CM | POA: Diagnosis not present

## 2023-03-24 DIAGNOSIS — E871 Hypo-osmolality and hyponatremia: Secondary | ICD-10-CM | POA: Diagnosis present

## 2023-03-24 DIAGNOSIS — G8929 Other chronic pain: Secondary | ICD-10-CM | POA: Diagnosis present

## 2023-03-24 DIAGNOSIS — B370 Candidal stomatitis: Secondary | ICD-10-CM | POA: Diagnosis present

## 2023-03-24 DIAGNOSIS — I499 Cardiac arrhythmia, unspecified: Secondary | ICD-10-CM | POA: Diagnosis not present

## 2023-03-24 DIAGNOSIS — Z90710 Acquired absence of both cervix and uterus: Secondary | ICD-10-CM

## 2023-03-24 DIAGNOSIS — I5030 Unspecified diastolic (congestive) heart failure: Secondary | ICD-10-CM | POA: Insufficient documentation

## 2023-03-24 DIAGNOSIS — R600 Localized edema: Secondary | ICD-10-CM | POA: Diagnosis not present

## 2023-03-24 DIAGNOSIS — M810 Age-related osteoporosis without current pathological fracture: Secondary | ICD-10-CM | POA: Diagnosis present

## 2023-03-24 DIAGNOSIS — I2510 Atherosclerotic heart disease of native coronary artery without angina pectoris: Secondary | ICD-10-CM | POA: Diagnosis present

## 2023-03-24 DIAGNOSIS — I371 Nonrheumatic pulmonary valve insufficiency: Secondary | ICD-10-CM | POA: Diagnosis present

## 2023-03-24 DIAGNOSIS — H3530 Unspecified macular degeneration: Secondary | ICD-10-CM | POA: Diagnosis present

## 2023-03-24 DIAGNOSIS — E43 Unspecified severe protein-calorie malnutrition: Secondary | ICD-10-CM | POA: Diagnosis not present

## 2023-03-24 DIAGNOSIS — S80921D Unspecified superficial injury of right lower leg, subsequent encounter: Secondary | ICD-10-CM | POA: Diagnosis not present

## 2023-03-24 DIAGNOSIS — L89151 Pressure ulcer of sacral region, stage 1: Secondary | ICD-10-CM | POA: Diagnosis not present

## 2023-03-24 DIAGNOSIS — M549 Dorsalgia, unspecified: Secondary | ICD-10-CM | POA: Diagnosis present

## 2023-03-24 DIAGNOSIS — Z87440 Personal history of urinary (tract) infections: Secondary | ICD-10-CM

## 2023-03-24 DIAGNOSIS — Z96641 Presence of right artificial hip joint: Secondary | ICD-10-CM | POA: Diagnosis present

## 2023-03-24 DIAGNOSIS — E039 Hypothyroidism, unspecified: Secondary | ICD-10-CM | POA: Diagnosis not present

## 2023-03-24 DIAGNOSIS — S90922D Unspecified superficial injury of left foot, subsequent encounter: Secondary | ICD-10-CM | POA: Diagnosis not present

## 2023-03-24 DIAGNOSIS — E785 Hyperlipidemia, unspecified: Secondary | ICD-10-CM | POA: Diagnosis not present

## 2023-03-24 DIAGNOSIS — I495 Sick sinus syndrome: Secondary | ICD-10-CM | POA: Diagnosis present

## 2023-03-24 DIAGNOSIS — N1831 Chronic kidney disease, stage 3a: Secondary | ICD-10-CM | POA: Diagnosis present

## 2023-03-24 DIAGNOSIS — K219 Gastro-esophageal reflux disease without esophagitis: Secondary | ICD-10-CM | POA: Diagnosis present

## 2023-03-24 DIAGNOSIS — I5032 Chronic diastolic (congestive) heart failure: Secondary | ICD-10-CM | POA: Diagnosis not present

## 2023-03-24 DIAGNOSIS — R140 Abdominal distension (gaseous): Secondary | ICD-10-CM | POA: Diagnosis not present

## 2023-03-24 DIAGNOSIS — Z9889 Other specified postprocedural states: Secondary | ICD-10-CM

## 2023-03-24 DIAGNOSIS — I480 Paroxysmal atrial fibrillation: Secondary | ICD-10-CM | POA: Diagnosis not present

## 2023-03-24 DIAGNOSIS — I110 Hypertensive heart disease with heart failure: Secondary | ICD-10-CM | POA: Diagnosis not present

## 2023-03-24 DIAGNOSIS — K761 Chronic passive congestion of liver: Secondary | ICD-10-CM | POA: Diagnosis present

## 2023-03-24 DIAGNOSIS — R531 Weakness: Secondary | ICD-10-CM | POA: Diagnosis not present

## 2023-03-24 DIAGNOSIS — L89150 Pressure ulcer of sacral region, unstageable: Secondary | ICD-10-CM | POA: Diagnosis present

## 2023-03-24 DIAGNOSIS — R631 Polydipsia: Secondary | ICD-10-CM | POA: Diagnosis present

## 2023-03-24 DIAGNOSIS — J90 Pleural effusion, not elsewhere classified: Secondary | ICD-10-CM

## 2023-03-24 DIAGNOSIS — D62 Acute posthemorrhagic anemia: Secondary | ICD-10-CM | POA: Diagnosis not present

## 2023-03-24 DIAGNOSIS — E876 Hypokalemia: Secondary | ICD-10-CM | POA: Diagnosis not present

## 2023-03-24 DIAGNOSIS — Z6826 Body mass index (BMI) 26.0-26.9, adult: Secondary | ICD-10-CM

## 2023-03-24 DIAGNOSIS — F32A Depression, unspecified: Secondary | ICD-10-CM | POA: Diagnosis present

## 2023-03-24 DIAGNOSIS — I509 Heart failure, unspecified: Secondary | ICD-10-CM

## 2023-03-24 DIAGNOSIS — S72009A Fracture of unspecified part of neck of unspecified femur, initial encounter for closed fracture: Secondary | ICD-10-CM | POA: Diagnosis present

## 2023-03-24 DIAGNOSIS — Z743 Need for continuous supervision: Secondary | ICD-10-CM | POA: Diagnosis not present

## 2023-03-24 DIAGNOSIS — R7401 Elevation of levels of liver transaminase levels: Secondary | ICD-10-CM | POA: Diagnosis present

## 2023-03-24 DIAGNOSIS — R918 Other nonspecific abnormal finding of lung field: Secondary | ICD-10-CM | POA: Diagnosis not present

## 2023-03-24 DIAGNOSIS — Z7901 Long term (current) use of anticoagulants: Secondary | ICD-10-CM

## 2023-03-24 DIAGNOSIS — L89320 Pressure ulcer of left buttock, unstageable: Secondary | ICD-10-CM | POA: Diagnosis present

## 2023-03-24 DIAGNOSIS — R609 Edema, unspecified: Secondary | ICD-10-CM | POA: Diagnosis not present

## 2023-03-24 DIAGNOSIS — G3184 Mild cognitive impairment, so stated: Secondary | ICD-10-CM | POA: Diagnosis present

## 2023-03-24 DIAGNOSIS — R54 Age-related physical debility: Secondary | ICD-10-CM | POA: Diagnosis present

## 2023-03-24 DIAGNOSIS — D631 Anemia in chronic kidney disease: Secondary | ICD-10-CM | POA: Diagnosis not present

## 2023-03-24 DIAGNOSIS — Z79890 Hormone replacement therapy (postmenopausal): Secondary | ICD-10-CM

## 2023-03-24 DIAGNOSIS — Z87891 Personal history of nicotine dependence: Secondary | ICD-10-CM

## 2023-03-24 DIAGNOSIS — I517 Cardiomegaly: Secondary | ICD-10-CM | POA: Diagnosis not present

## 2023-03-24 DIAGNOSIS — Z803 Family history of malignant neoplasm of breast: Secondary | ICD-10-CM

## 2023-03-24 DIAGNOSIS — Z79899 Other long term (current) drug therapy: Secondary | ICD-10-CM

## 2023-03-24 DIAGNOSIS — S72141D Displaced intertrochanteric fracture of right femur, subsequent encounter for closed fracture with routine healing: Secondary | ICD-10-CM | POA: Diagnosis not present

## 2023-03-24 DIAGNOSIS — L89310 Pressure ulcer of right buttock, unstageable: Secondary | ICD-10-CM | POA: Diagnosis not present

## 2023-03-24 DIAGNOSIS — I872 Venous insufficiency (chronic) (peripheral): Secondary | ICD-10-CM | POA: Diagnosis not present

## 2023-03-24 DIAGNOSIS — I361 Nonrheumatic tricuspid (valve) insufficiency: Secondary | ICD-10-CM | POA: Diagnosis not present

## 2023-03-24 LAB — CBC WITH DIFFERENTIAL/PLATELET
Abs Immature Granulocytes: 0.1 10*3/uL — ABNORMAL HIGH (ref 0.00–0.07)
Basophils Absolute: 0 10*3/uL (ref 0.0–0.1)
Basophils Relative: 0 %
Eosinophils Absolute: 0.1 10*3/uL (ref 0.0–0.5)
Eosinophils Relative: 1 %
HCT: 34.4 % — ABNORMAL LOW (ref 36.0–46.0)
Hemoglobin: 11.2 g/dL — ABNORMAL LOW (ref 12.0–15.0)
Immature Granulocytes: 1 %
Lymphocytes Relative: 5 %
Lymphs Abs: 0.7 10*3/uL (ref 0.7–4.0)
MCH: 29.1 pg (ref 26.0–34.0)
MCHC: 32.6 g/dL (ref 30.0–36.0)
MCV: 89.4 fL (ref 80.0–100.0)
Monocytes Absolute: 0.8 10*3/uL (ref 0.1–1.0)
Monocytes Relative: 6 %
Neutro Abs: 12 10*3/uL — ABNORMAL HIGH (ref 1.7–7.7)
Neutrophils Relative %: 87 %
Platelets: 411 10*3/uL — ABNORMAL HIGH (ref 150–400)
RBC: 3.85 MIL/uL — ABNORMAL LOW (ref 3.87–5.11)
RDW: 21.2 % — ABNORMAL HIGH (ref 11.5–15.5)
Smear Review: NORMAL
WBC: 13.7 10*3/uL — ABNORMAL HIGH (ref 4.0–10.5)
nRBC: 0 % (ref 0.0–0.2)

## 2023-03-24 LAB — COMPREHENSIVE METABOLIC PANEL
ALT: 48 U/L — ABNORMAL HIGH (ref 0–44)
AST: 65 U/L — ABNORMAL HIGH (ref 15–41)
Albumin: 1.8 g/dL — ABNORMAL LOW (ref 3.5–5.0)
Alkaline Phosphatase: 109 U/L (ref 38–126)
Anion gap: 8 (ref 5–15)
BUN: 15 mg/dL (ref 8–23)
CO2: 22 mmol/L (ref 22–32)
Calcium: 7.8 mg/dL — ABNORMAL LOW (ref 8.9–10.3)
Chloride: 97 mmol/L — ABNORMAL LOW (ref 98–111)
Creatinine, Ser: 0.92 mg/dL (ref 0.44–1.00)
GFR, Estimated: >60 mL/min (ref >60)
Glucose, Bld: 99 mg/dL (ref 70–99)
Potassium: 4.1 mmol/L (ref 3.5–5.1)
Sodium: 127 mmol/L — ABNORMAL LOW (ref 135–145)
Total Bilirubin: 1.2 mg/dL — ABNORMAL HIGH (ref <1.2)
Total Protein: 6.1 g/dL — ABNORMAL LOW (ref 6.5–8.1)

## 2023-03-24 LAB — TROPONIN I (HIGH SENSITIVITY): Troponin I (High Sensitivity): 21 ng/L — ABNORMAL HIGH (ref <18)

## 2023-03-24 LAB — BRAIN NATRIURETIC PEPTIDE: B Natriuretic Peptide: 497.7 pg/mL — ABNORMAL HIGH (ref 0.0–100.0)

## 2023-03-24 LAB — MAGNESIUM: Magnesium: 1.8 mg/dL (ref 1.7–2.4)

## 2023-03-24 MED ORDER — ALBUTEROL SULFATE (2.5 MG/3ML) 0.083% IN NEBU: 2.5000 mg | INHALATION_SOLUTION | RESPIRATORY_TRACT | Status: DC | PRN

## 2023-03-24 MED ORDER — APIXABAN 2.5 MG PO TABS
2.5000 mg | ORAL_TABLET | Freq: Two times a day (BID) | ORAL | Status: DC
  Administered 2023-03-24 – 2023-04-02 (×18): 2.5 mg via ORAL
  Filled 2023-03-24 (×18): qty 1

## 2023-03-24 MED ORDER — FUROSEMIDE 10 MG/ML IJ SOLN
40.0000 mg | Freq: Once | INTRAMUSCULAR | Status: AC
  Administered 2023-03-24: 40 mg via INTRAVENOUS
  Filled 2023-03-24: qty 4

## 2023-03-24 MED ORDER — ACETAMINOPHEN 325 MG PO TABS
650.0000 mg | ORAL_TABLET | Freq: Four times a day (QID) | ORAL | Status: DC | PRN
  Administered 2023-03-30: 650 mg via ORAL
  Filled 2023-03-24 (×2): qty 2

## 2023-03-24 MED ORDER — SENNOSIDES-DOCUSATE SODIUM 8.6-50 MG PO TABS: 1.0000 | ORAL_TABLET | Freq: Every day | ORAL | Status: DC

## 2023-03-24 MED ORDER — LEVOTHYROXINE SODIUM 75 MCG PO TABS
75.0000 ug | ORAL_TABLET | Freq: Every day | ORAL | Status: DC
  Administered 2023-03-25 – 2023-04-02 (×8): 75 ug via ORAL
  Filled 2023-03-24 (×9): qty 1

## 2023-03-24 MED ORDER — MAGNESIUM SULFATE 2 GM/50ML IV SOLN
2.0000 g | Freq: Once | INTRAVENOUS | Status: AC
  Administered 2023-03-24: 2 g via INTRAVENOUS
  Filled 2023-03-24: qty 50

## 2023-03-24 MED ORDER — ONDANSETRON HCL 4 MG PO TABS
4.0000 mg | ORAL_TABLET | Freq: Three times a day (TID) | ORAL | Status: DC | PRN
  Administered 2023-03-24 – 2023-03-25 (×2): 4 mg via ORAL
  Filled 2023-03-24 (×2): qty 1

## 2023-03-24 MED ORDER — NYSTATIN 100000 UNIT/ML MT SUSP
5.0000 mL | Freq: Four times a day (QID) | OROMUCOSAL | Status: DC
  Administered 2023-03-24 – 2023-04-02 (×30): 500000 [IU] via ORAL
  Filled 2023-03-24 (×33): qty 5

## 2023-03-24 MED ORDER — ACETAMINOPHEN 650 MG RE SUPP: 650.0000 mg | Freq: Four times a day (QID) | RECTAL | Status: DC | PRN

## 2023-03-24 MED ORDER — PANTOPRAZOLE SODIUM 40 MG PO TBEC
40.0000 mg | DELAYED_RELEASE_TABLET | Freq: Two times a day (BID) | ORAL | Status: DC
  Administered 2023-03-24 – 2023-04-02 (×18): 40 mg via ORAL
  Filled 2023-03-24 (×18): qty 1

## 2023-03-24 MED ORDER — AMIODARONE HCL 200 MG PO TABS
200.0000 mg | ORAL_TABLET | Freq: Every day | ORAL | Status: DC
  Administered 2023-03-25 – 2023-04-02 (×9): 200 mg via ORAL
  Filled 2023-03-24 (×9): qty 1

## 2023-03-24 MED ORDER — ATORVASTATIN CALCIUM 40 MG PO TABS
40.0000 mg | ORAL_TABLET | Freq: Every day | ORAL | Status: DC
  Administered 2023-03-25 – 2023-04-02 (×9): 40 mg via ORAL
  Filled 2023-03-24 (×9): qty 1

## 2023-03-24 MED ORDER — MOMETASONE FURO-FORMOTEROL FUM 100-5 MCG/ACT IN AERO
2.0000 | INHALATION_SPRAY | Freq: Two times a day (BID) | RESPIRATORY_TRACT | Status: DC
  Administered 2023-03-24 – 2023-04-02 (×18): 2 via RESPIRATORY_TRACT
  Filled 2023-03-24: qty 8.8

## 2023-03-24 MED ORDER — NITROGLYCERIN 0.4 MG SL SUBL: 0.4000 mg | SUBLINGUAL_TABLET | SUBLINGUAL | Status: DC | PRN

## 2023-03-24 MED ORDER — FUROSEMIDE 10 MG/ML IJ SOLN: 40.0000 mg | Freq: Two times a day (BID) | INTRAMUSCULAR | Status: DC

## 2023-03-24 MED ORDER — HYDROCODONE-ACETAMINOPHEN 5-325 MG PO TABS: 1.0000 | ORAL_TABLET | ORAL | Status: DC | PRN

## 2023-03-24 MED ORDER — POTASSIUM CHLORIDE ER 10 MEQ PO TBCR
10.0000 meq | EXTENDED_RELEASE_TABLET | Freq: Every day | ORAL | Status: DC
Start: 2023-03-25 — End: 2023-03-25
  Administered 2023-03-25: 10 meq via ORAL
  Filled 2023-03-24 (×2): qty 1

## 2023-03-24 MED ORDER — OXYCODONE HCL 5 MG PO TABS: 5.0000 mg | ORAL_TABLET | Freq: Four times a day (QID) | ORAL | Status: DC | PRN

## 2023-03-24 MED ORDER — POTASSIUM CHLORIDE ER 10 MEQ PO TBCR: 10.0000 meq | EXTENDED_RELEASE_TABLET | ORAL | Status: DC

## 2023-03-24 MED ORDER — FUROSEMIDE 10 MG/ML IJ SOLN
20.0000 mg | Freq: Two times a day (BID) | INTRAMUSCULAR | Status: DC
Start: 2023-03-25 — End: 2023-03-25
  Administered 2023-03-25 (×2): 20 mg via INTRAVENOUS
  Filled 2023-03-24 (×2): qty 2

## 2023-03-24 NOTE — H&P (Signed)
History and Physical    DOA: 03/24/2023  PCP: Georgann Housekeeper, MD  Patient coming from: Home  Chief Complaint: Worsening bilateral leg swellings  HPI: Laurie James is a 77 y.o. female with history h/o COPD, paroxysmal atrial fibrillation on anticoagulation, chronic diastolic CHF/bilateral pleural effusions, osteoporosis, age-related macular degeneration, chronic back pain who was admitted last month for right hip replacement and subsequently discharged to rehab facility and then home, brought in by family today in concern for worsening leg swellings.  Son at bedside and helps with history.  According to son patient has had poor effort in getting out of bed since discharge from rehab facility and mostly been bedbound.  She is followed by home health nurse who visits once a week and apparently had developed bedsores over the last few days.  Son states patient has had worsening leg swellings over the last week and he was concerned about her overall declining status.  He states himself and his sister usually arrange her pillbox with medications for the week and she is able to take them.  She apparently was prescribed a new diuretic to be taken 3 times a week in addition to Lasix daily by her PCP for the leg swellings.  She also has as needed medications but son states patient not been taking any as needed medications and only takes those arranged in the pillbox.  Patient also been drinking a lot of fluids/Gatorade apparently due to dry mouth and feeling dehydrated.  Additionally, he reports patient developed oral thrush after recent antibiotic course that she received for UTI.  She denies any fevers or chest pain or shortness of breath or headaches or nausea or vomiting.  When asked about limited mobilization since hip surgery, she states she has cramping in her legs whenever she tries to move and she is afraid she would fall again. ED course: Temp 97.1-98.7, pulse 51/98, RR 18-25, BP 105/53-1  23/63, O2 sat 93-98% on room air, weight 50.9 kg.  Lab work revealed WBC 13.7, hemoglobin 11.2, hematocrit 34.4, platelet 411, sodium 127, potassium 4.1, chloride 97, BUN 15, creatinine 0.92 (previously 1.3-1.4), calcium 7.8, Trop HS 21, glucose 99, BNP 497.7.  X-ray with moderate left and small right pleural effusion with underlying emphysema.  Patient received 1 dose of IV Lasix in the ED and admission requested for further diuretic management.  Review of Systems: As per HPI, otherwise review of systems negative.    Past Medical History:  Diagnosis Date   AMD (age related macular degeneration)    COPD (chronic obstructive pulmonary disease) (HCC)    Depression    Hiatal hernia    Intermittent low back pain    Memory impairment    Midsternal chest pain    a. 12/2011 Cardiac CTA Ca++ score of 103.3 (80th %), LAD <50p/m, RCA 50-75.   Osteoporosis    PAF (paroxysmal atrial fibrillation) (HCC) 09/2020   Paroxysmal atrial fibrillation with RVR (HCC) 11/28/2021   Vitamin B 12 deficiency     Past Surgical History:  Procedure Laterality Date   ABDOMINAL HYSTERECTOMY     ATRIAL FIBRILLATION ABLATION N/A 03/08/2022   Procedure: ATRIAL FIBRILLATION ABLATION;  Surgeon: Maurice Small, MD;  Location: [facility];  Service: Cardiovascular;  Laterality: N/A;   INTRAMEDULLARY (IM) NAIL INTERTROCHANTERIC Right 02/01/2023   Procedure: INTRAMEDULLARY (IM) NAIL INTERTROCHANTERIC;  Surgeon: Terance Hart, MD;  Location: [facility] OR;  Service: Orthopedics;  Laterality: Right;   IR THORACENTESIS ASP PLEURAL SPACE W/IMG  GUIDE  10/20/2020   IR THORACENTESIS ASP PLEURAL SPACE W/IMG GUIDE  05/02/2022   IR THORACENTESIS ASP PLEURAL SPACE W/IMG GUIDE  09/07/2022   IR THORACENTESIS ASP PLEURAL SPACE W/IMG GUIDE  12/27/2022   RIGHT HEART CATH N/A 09/06/2022   Procedure: RIGHT HEART CATH;  Surgeon: Dorthula Nettles, DO;  Location: [facility];  Service: Cardiovascular;  Laterality: N/A;   RIGHT/LEFT  HEART CATH AND CORONARY ANGIOGRAPHY N/A 10/07/2020   Procedure: RIGHT/LEFT HEART CATH AND CORONARY ANGIOGRAPHY;  Surgeon: Runell Gess, MD;  Location: [facility];  Service: Cardiovascular;  Laterality: N/A;   TEE WITHOUT CARDIOVERSION N/A 10/25/2020   Procedure: TRANSESOPHAGEAL ECHOCARDIOGRAM (TEE);  Surgeon: Quintella Reichert, MD;  Location: [facility] ENDOSCOPY;  Service: Cardiovascular;  Laterality: N/A;    Social history:  reports that she quit smoking about 2 years ago. Her smoking use included cigarettes. She started smoking about 67 years ago. She has a 65 pack-year smoking history. She has never used smokeless tobacco. She reports that she does not drink alcohol and does not use drugs.   Allergies  Allergen Reactions   Codeine Nausea And Vomiting and Other (See Comments)    Upsets the stomach    Family History  Problem Relation Age of Onset   Breast cancer Sister    Breast cancer Sister       Prior to Admission medications   Medication Sig Start Date End Date Taking? Authorizing Provider  albuterol (VENTOLIN HFA) 108 (90 Base) MCG/ACT inhaler Inhale 2 puffs into the lungs every 4 (four) hours as needed for wheezing. 06/08/20  Yes [provider]  amiodarone (PACERONE) 200 MG tablet Take 1 tablet (200 mg total) by mouth daily for 325 days, THEN 1 tablet (200 mg total) daily. 02/05/23 11/16/24 Yes Zannie Cove, MD  apixaban Everlene Balls) 2.5 MG TABS tablet Take 1 tablet by mouth twice daily 01/30/23  Yes Bensimhon, Bevelyn Buckles, MD  atorvastatin (LIPITOR) 40 MG tablet Stop taking until you see your PCP and obtain repeat labwork. Patient taking differently: Take 40 mg by mouth daily. Stop taking until you see your PCP and obtain repeat labwork. 01/31/23  Yes Narda Bonds, MD  budesonide-formoterol (SYMBICORT) 80-4.5 MCG/ACT inhaler Inhale 2 puffs into the lungs in the morning and at bedtime. 10/30/22  Yes Briant Cedar, MD  cyanocobalamin (,VITAMIN B-12,) 1000 MCG/ML  injection Inject 1,000 mcg into the muscle every 30 (thirty) days. 06/11/16  Yes [provider]  furosemide (LASIX) 20 MG tablet Take 2 tablets (40 mg total) by mouth every Monday, Wednesday, and Friday. Leg edema 03/02/23  Yes Clegg, Amy D, NP  nitroGLYCERIN (NITROSTAT) 0.4 MG SL tablet Place 1 tablet (0.4 mg total) under the tongue every 5 (five) minutes x 3 doses as needed for chest pain. 10/11/20  Yes Georgie Chard D, NP  nystatin (MYCOSTATIN) 100000 UNIT/ML suspension Take 5 mLs by mouth 4 (four) times daily. 03/23/23  Yes [provider]  ondansetron (ZOFRAN) 4 MG tablet Take 4 mg by mouth 3 (three) times daily as needed for nausea or vomiting. 11/22/20  Yes [provider]  pantoprazole (PROTONIX) 40 MG tablet Take 1 tablet (40 mg total) by mouth 2 (two) times daily. 09/05/22  Yes Bensimhon, Bevelyn Buckles, MD  potassium chloride (KLOR-CON) 10 MEQ tablet Take 1 tablet (10 mEq total) by mouth every Monday, Wednesday, and Friday. 01/12/23  Yes Milford, Anderson Malta, FNP  SYNTHROID 75 MCG tablet Take 75 mcg by mouth daily before  breakfast. 10/18/22  Yes [provider]    Physical Exam: Vitals:   03/24/23 1820 03/24/23 1830 03/24/23 1856 03/24/23 1932  BP:   110/65 (!) 105/53  Pulse: 63  98 (!) 59  Resp: (!) 23  19 (!) 22  Temp:  98 F (36.7 C) 98.7 F (37.1 C) (!) 97.1 F (36.2 C)  TempSrc:   Axillary Axillary  SpO2: 97%  95% 93%  Weight:   50.9 kg   Height:   4\' 8"  (1.422 m)     Constitutional: Chronically ill-appearing patient laying in stretcher in no acute distress.  Son at bedside. Eyes: PERRL, lids and conjunctivae normal ENMT: Noted to have oral thrush mostly along right side of the tongue, posterior pharynx clear of any exudate or lesions. Neck: Positive sign mild JVD, neck supple, no masses, no thyromegaly Respiratory: Reduced breath sounds at bases, no wheezing, no crackles. Normal respiratory effort. No accessory muscle use.  Cardiovascular: Regular  rate and rhythm, no murmurs .3+ pitting bilateral lower extremity edema. 2+ pedal pulses.  Abdomen: +sacral edema, abdomen with no tenderness, no masses palpated. No hepatosplenomegaly. Bowel sounds positive.  Musculoskeletal: no clubbing / cyanosis.  Limited range of motion along bilateral hip/knee joints likely due to severe leg edema.   Neurologic: CN 2-12 grossly intact. Sensation intact, DTR normal. Strength 4/5 in all 4.  Psychiatric: Normal judgment and insight. Alert and oriented x 3.  Flat affect SKIN/catheters: Sacral decubiti-skin breakdown-stage I as shown below     Labs on Admission: I have personally reviewed following labs and imaging studies  CBC: Recent Labs  Lab 03/24/23 1352  WBC 13.7*  NEUTROABS 12.0*  HGB 11.2*  HCT 34.4*  MCV 89.4  PLT 411*   Basic Metabolic Panel: Recent Labs  Lab 03/24/23 1352  NA 127*  K 4.1  CL 97*  CO2 22  GLUCOSE 99  BUN 15  CREATININE 0.92  CALCIUM 7.8*   GFR: Estimated Creatinine Clearance: 34 mL/min (by C-G formula based on SCr of 0.92 mg/dL). Recent Labs  Lab 03/24/23 1352  WBC 13.7*   Liver Function Tests: Recent Labs  Lab 03/24/23 1352  AST 65*  ALT 48*  ALKPHOS 109  BILITOT 1.2*  PROT 6.1*  ALBUMIN 1.8*   No results for input(s): "LIPASE", "AMYLASE" in the last 168 hours. No results for input(s): "AMMONIA" in the last 168 hours. Coagulation Profile: No results for input(s): "INR", "PROTIME" in the last 168 hours. Cardiac Enzymes: No results for input(s): "CKTOTAL", "CKMB", "CKMBINDEX", "TROPONINI" in the last 168 hours. BNP (last 3 results) No results for input(s): "PROBNP" in the last 8760 hours. HbA1C: No results for input(s): "HGBA1C" in the last 72 hours. CBG: No results for input(s): "GLUCAP" in the last 168 hours. Lipid Profile: No results for input(s): "CHOL", "HDL", "LDLCALC", "TRIG", "CHOLHDL", "LDLDIRECT" in the last 72 hours. Thyroid Function Tests: No results for input(s): "TSH",  "T4TOTAL", "FREET4", "T3FREE", "THYROIDAB" in the last 72 hours. Anemia Panel: No results for input(s): "VITAMINB12", "FOLATE", "FERRITIN", "TIBC", "IRON", "RETICCTPCT" in the last 72 hours. Urine analysis:    Component Value Date/Time   COLORURINE YELLOW 05/01/2022 1922   APPEARANCEUR HAZY (A) 05/01/2022 1922   LABSPEC 1.029 05/01/2022 1922   PHURINE 5.0 05/01/2022 1922   GLUCOSEU 50 (A) 05/01/2022 1922   HGBUR SMALL (A) 05/01/2022 1922   BILIRUBINUR NEGATIVE 05/01/2022 1922   KETONESUR NEGATIVE 05/01/2022 1922   PROTEINUR 100 (A) 05/01/2022 1922   NITRITE NEGATIVE 05/01/2022 1922  LEUKOCYTESUR NEGATIVE 05/01/2022 1922    Radiological Exams on Admission: Personally reviewed  DG Chest Portable 1 View Result Date: 03/24/2023 CLINICAL DATA:  CHF EXAM: PORTABLE CHEST 1 VIEW COMPARISON:  01/31/2023, CT chest 03/30/2022., chest x-ray 12/27/2022 FINDINGS: Mild cardiomegaly. Moderate right and small left pleural effusions. Underlying emphysema and chronic appearing interstitial opacities. Right greater than left basilar airspace disease. Aortic atherosclerosis. IMPRESSION: 1. Mild cardiomegaly. Moderate right and small left pleural effusions with right greater than left basilar airspace disease, atelectasis versus pneumonia. 2. Underlying emphysema and chronic appearing interstitial opacities. Electronically Signed   By: Jasmine Pang M.D.   On: 03/24/2023 15:45    EKG: Independently reviewed.  Atrial fibrillation versus sinus with ectopic rhythm, QTc 577 ms     Assessment and Plan:   Principal Problem:   Acute on chronic diastolic CHF (congestive heart failure) (HCC) Active Problems:   Bilateral pleural effusion   Paroxysmal atrial fibrillation (HCC)   Chronic kidney disease, stage 3a (HCC)   HLD (hyperlipidemia)   Hyponatremia   CAD (coronary artery disease)   Macular degeneration   MCI (mild cognitive impairment)   GERD without esophagitis   Depression   Pulmonary HTN  (HCC)   OSA (obstructive sleep apnea)   Gout   Hypothyroidism   Hip fracture (HCC)    1.  Acute on chronic diastolic CHF: Primarily concerned about worsening leg edema and also with bilateral pleural effusions/pulmonary edema.  BNP 497.  Admitted with IV diuretics. Advised of fluid restriction, monitor I's/ O's and renal function.  Follows Dr. Gala Romney for cardiology-will request evaluation in AM.  Echo in July 2024 showed preserved EF with stage II diastolic dysfunction.  Patient may benefit from home CHF program--she needs guidance regarding daily diuretic adjustments, daily weights, inputs/outputs etc.    2.  Acute on chronic hyponatremia: Patient's baseline sodium appears to be around 134, currently at 127 likely hypervolemic hyponatremia in the setting of polydipsia and CHF.  Monitor trend with IV diuretics and adjust accordingly.  Urine sodium requested  3.  Prolonged QTc: EKG shows prolonged QT interval at 577 ms.  Monitor electrolytes while on diuretics.  Avoid QT prolonging agents.  Will give IV magnesium, keep potassium close to 4.  Patient on amiodarone (?  Need to hold)-can consider beta-blockers but patient's heart rate appears to be in the 50s mostly.  Repeat EKG and cardiology to evaluate patient in a.m.  4.  Paroxysmal atrial fibrillation: On amiodarone and Eliquis.  Heart rate 50-98.?  Tachybradycardia syndrome.  5.?  CKD stage IIIa: Patient's baseline creatinine appears to be around 1.3-1.4 when in a dry state.  Currently hyper bulimic and creatinine normal suggesting cardiorenal syndrome.  6.  Oral thrush: In the setting of recent antibiotic use for UTI.  Nystatin swish and swallow ordered.  7. Pressure sores: Appear to be stage I as shown above.  Wound care consulted.  8.  Recent right hip fracture: Status post hip replacement.  Limited mobility since then.  PT evaluation.  DVT risk explained.  9.  Elevated LFTs: Likely secondary to congested liver.  Repeat after  diuresis.  10.  COPD: Resume home inhalers.  Does not appear to be in exacerbation currently.    11. Hyperlipidemia, GERD, depression, gout, OSA, hypothyroidism and other chronic conditions: Resume home medications   DVT prophylaxis: On anticoagulation  Code Status: As discussed with patient and son at bedside-No CPR or chest compressions.  Okay to intubate and other medications.   .Health care  proxy would be her son and daughter  Patient/Family Communication: Discussed with patient and all questions answered to satisfaction.  Consults called: Informed Dr. Gala Romney of patient's admission--He recommended calling in general cardiology consult in a.m. Admission status :Patient will be admitted under OBSERVATION status.The patient's presenting symptoms, physical exam findings, and initial radiographic and laboratory data in the context of their medical condition is felt to place them at low risk for further clinical deterioration. Furthermore, it is anticipated that the patient will be medically stable for discharge from the hospital within 2 midnights of hospital stay. . May need to upgrade to inpatient status if needs prolonged IV diuresis, for leg edema limiting ambulation, beyond 2 midnight stay.     Laurie Bevels MD [facility] Pager in [place]  If 7PM-7AM, please contact night-coverage [email or web address]   03/24/2023, 7:33 PM

## 2023-03-24 NOTE — ED Notes (Signed)
ED TO INPATIENT HANDOFF REPORT  ED Nurse Name and Phone #: Beatris Ship RN [phone]  S Name/Age/Gender Laurie James 77 y.o. female Room/Bed: 004C/004C  Code Status   Code Status: Do not attempt resuscitation (DNR) PRE-ARREST INTERVENTIONS DESIRED  Home/SNF/Other Home Patient oriented to: self, place, time, and situation Is this baseline? Yes   Triage Complete: Triage complete  Chief Complaint Diastolic CHF (HCC) [I50.30]  Triage Note Pt BIB [facility] from home d/t bil pedal edema & weeping. Pt reports her legs were normal 3 days ago & today they are as larger as they are. A/Ox4, 140/72, 54 bpm, 18 resp, 92% RA, no PIV upon arrival.    Allergies Allergies  Allergen Reactions   Codeine Nausea And Vomiting and Other (See Comments)    Upsets the stomach    Level of Care/Admitting Diagnosis ED Disposition     ED Disposition  Admit   Condition  --   Comment  Hospital Area: [facility] [[id number]]  Level of Care: Telemetry Cardiac [103]  May place patient in observation at [facility] or [facility] if equivalent level of care is available:: Yes  Covid Evaluation: Asymptomatic - no recent exposure (last 10 days) testing not required  Diagnosis: Diastolic CHF [facility]) [[id number]]  Admitting Physician: Alessandra Bevels [[id number]]  Attending Physician: Alessandra Bevels [[id number]]          B Medical/Surgery History Past Medical History:  Diagnosis Date   AMD (age related macular degeneration)    COPD (chronic obstructive pulmonary disease) (HCC)    Depression    Hiatal hernia    Intermittent low back pain    Memory impairment    Midsternal chest pain    a. 12/2011 Cardiac CTA Ca++ score of 103.3 (80th %), LAD <50p/m, RCA 50-75.   Osteoporosis    PAF (paroxysmal atrial fibrillation) (HCC) 09/2020   Paroxysmal atrial fibrillation with RVR (HCC) 11/28/2021   Vitamin B 12 deficiency    Past Surgical History:  Procedure Laterality Date    ABDOMINAL HYSTERECTOMY     ATRIAL FIBRILLATION ABLATION N/A 03/08/2022   Procedure: ATRIAL FIBRILLATION ABLATION;  Surgeon: Maurice Small, MD;  Location: [facility] INVASIVE CV LAB;  Service: Cardiovascular;  Laterality: N/A;   INTRAMEDULLARY (IM) NAIL INTERTROCHANTERIC Right 02/01/2023   Procedure: INTRAMEDULLARY (IM) NAIL INTERTROCHANTERIC;  Surgeon: Terance Hart, MD;  Location: [facility] OR;  Service: Orthopedics;  Laterality: Right;   IR THORACENTESIS ASP PLEURAL SPACE W/IMG GUIDE  10/20/2020   IR THORACENTESIS ASP PLEURAL SPACE W/IMG GUIDE  05/02/2022   IR THORACENTESIS ASP PLEURAL SPACE W/IMG GUIDE  09/07/2022   IR THORACENTESIS ASP PLEURAL SPACE W/IMG GUIDE  12/27/2022   RIGHT HEART CATH N/A 09/06/2022   Procedure: RIGHT HEART CATH;  Surgeon: Dorthula Nettles, DO;  Location: [facility] INVASIVE CV LAB;  Service: Cardiovascular;  Laterality: N/A;   RIGHT/LEFT HEART CATH AND CORONARY ANGIOGRAPHY N/A 10/07/2020   Procedure: RIGHT/LEFT HEART CATH AND CORONARY ANGIOGRAPHY;  Surgeon: Runell Gess, MD;  Location: [facility] INVASIVE CV LAB;  Service: Cardiovascular;  Laterality: N/A;   TEE WITHOUT CARDIOVERSION N/A 10/25/2020   Procedure: TRANSESOPHAGEAL ECHOCARDIOGRAM (TEE);  Surgeon: Quintella Reichert, MD;  Location: [facility] ENDOSCOPY;  Service: Cardiovascular;  Laterality: N/A;     A IV Location/Drains/Wounds Patient Lines/Drains/Airways Status     Active Line/Drains/Airways     Name Placement date Placement time Site Days   Peripheral IV 03/24/23 20 G Anterior;Left Forearm 03/24/23  1659  Forearm  less than  1   Wound / Incision (Open or Dehisced) 10/20/20 Skin tear Pretibial Left 3cm skin tear to left shin 10/20/20  1055  Pretibial  885   Wound / Incision (Open or Dehisced) 09/05/22 Skin tear Ankle Anterior;Left Skin Tear 09/05/22  1745  Ankle  200   Wound / Incision (Open or Dehisced) 02/02/23 Skin tear Foot Left 02/02/23  1015  Foot  50            Intake/Output Last 24 hours No intake or output data  in the 24 hours ending 03/24/23 1749  Labs/Imaging Results for orders placed or performed during the hospital encounter of 03/24/23 (from the past 48 hours)  CBC with Differential     Status: Abnormal   Collection Time: 03/24/23  1:52 PM  Result Value Ref Range   WBC 13.7 (H) 4.0 - 10.5 K/uL   RBC 3.85 (L) 3.87 - 5.11 MIL/uL   Hemoglobin 11.2 (L) 12.0 - 15.0 g/dL   HCT 57.8 (L) 46.9 - 62.9 %   MCV 89.4 80.0 - 100.0 fL   MCH 29.1 26.0 - 34.0 pg   MCHC 32.6 30.0 - 36.0 g/dL   RDW 52.8 (H) 41.3 - 24.4 %   Platelets 411 (H) 150 - 400 K/uL   nRBC 0.0 0.0 - 0.2 %   Neutrophils Relative % 87 %   Neutro Abs 12.0 (H) 1.7 - 7.7 K/uL   Lymphocytes Relative 5 %   Lymphs Abs 0.7 0.7 - 4.0 K/uL   Monocytes Relative 6 %   Monocytes Absolute 0.8 0.1 - 1.0 K/uL   Eosinophils Relative 1 %   Eosinophils Absolute 0.1 0.0 - 0.5 K/uL   Basophils Relative 0 %   Basophils Absolute 0.0 0.0 - 0.1 K/uL   WBC Morphology MORPHOLOGY UNREMARKABLE    RBC Morphology See Note    Smear Review Normal platelet morphology    Immature Granulocytes 1 %   Abs Immature Granulocytes 0.10 (H) 0.00 - 0.07 K/uL   Polychromasia PRESENT     Comment: Performed at [facility], [address], [place], [place] [address]  Comprehensive metabolic panel     Status: Abnormal   Collection Time: 03/24/23  1:52 PM  Result Value Ref Range   Sodium 127 (L) 135 - 145 mmol/L   Potassium 4.1 3.5 - 5.1 mmol/L   Chloride 97 (L) 98 - 111 mmol/L   CO2 22 22 - 32 mmol/L   Glucose, Bld 99 70 - 99 mg/dL    Comment: Glucose reference range applies only to samples taken after fasting for at least 8 hours.   BUN 15 8 - 23 mg/dL   Creatinine, Ser 2.53 0.44 - 1.00 mg/dL   Calcium 7.8 (L) 8.9 - 10.3 mg/dL   Total Protein 6.1 (L) 6.5 - 8.1 g/dL   Albumin 1.8 (L) 3.5 - 5.0 g/dL   AST 65 (H) 15 - 41 U/L   ALT 48 (H) 0 - 44 U/L   Alkaline Phosphatase 109 38 - 126 U/L   Total Bilirubin 1.2 (H) <1.2 mg/dL   GFR, Estimated >66 >44 mL/min     Comment: (NOTE) Calculated using the CKD-EPI Creatinine Equation (2021)    Anion gap 8 5 - 15    Comment: Performed at [facility], [address] [place], [place] [address]  Brain natriuretic peptide     Status: Abnormal   Collection Time: 03/24/23  1:52 PM  Result Value Ref Range   B  Natriuretic Peptide 497.7 (H) 0.0 - 100.0 pg/mL    Comment: Performed at [facility], [address] [place], [place] [address]  Troponin I (High Sensitivity)     Status: Abnormal   Collection Time: 03/24/23  1:52 PM  Result Value Ref Range   Troponin I (High Sensitivity) 21 (H) <18 ng/L    Comment: (NOTE) Elevated high sensitivity troponin I (hsTnI) values and significant  changes across serial measurements may suggest ACS but many other  chronic and acute conditions are known to elevate hsTnI results.  Refer to the "Links" section for chest pain algorithms and additional  guidance. Performed at [facility], [address] [place], [place] [address]    DG Chest Portable 1 View Result Date: 03/24/2023 CLINICAL DATA:  CHF EXAM: PORTABLE CHEST 1 VIEW COMPARISON:  01/31/2023, CT chest 03/30/2022., chest x-ray 12/27/2022 FINDINGS: Mild cardiomegaly. Moderate right and small left pleural effusions. Underlying emphysema and chronic appearing interstitial opacities. Right greater than left basilar airspace disease. Aortic atherosclerosis. IMPRESSION: 1. Mild cardiomegaly. Moderate right and small left pleural effusions with right greater than left basilar airspace disease, atelectasis versus pneumonia. 2. Underlying emphysema and chronic appearing interstitial opacities. Electronically Signed   By: Jasmine Pang M.D.   On: 03/24/2023 15:45    Pending Labs Unresulted Labs (From admission, onward)     Start     Ordered   03/25/23 0500  Basic metabolic panel  Tomorrow morning,   R        03/24/23 1740   03/24/23 1745  Na and K (sodium & potassium), rand urine  Once,   R         03/24/23 1744            Vitals/Pain Today's Vitals   03/24/23 1500 03/24/23 1653 03/24/23 1700 03/24/23 1730  BP: 113/65 111/60 109/75 119/61  Pulse: (!) 55 (!) 56 (!) 57 63  Resp: 18 (!) 25 18 (!) 22  Temp:      TempSrc:      SpO2: 96% 94% 94% 95%  PainSc:        Isolation Precautions No active isolations  Medications Medications  amiodarone (PACERONE) tablet 200 mg (has no administration in time range)  atorvastatin (LIPITOR) tablet 40 mg (has no administration in time range)  nitroGLYCERIN (NITROSTAT) SL tablet 0.4 mg (has no administration in time range)  levothyroxine (SYNTHROID) tablet 75 mcg (has no administration in time range)  ondansetron (ZOFRAN) tablet 4 mg (has no administration in time range)  pantoprazole (PROTONIX) EC tablet 40 mg (has no administration in time range)  apixaban (ELIQUIS) tablet 2.5 mg (has no administration in time range)  mometasone-formoterol (DULERA) 100-5 MCG/ACT inhaler 2 puff (has no administration in time range)  acetaminophen (TYLENOL) tablet 650 mg (has no administration in time range)    Or  acetaminophen (TYLENOL) suppository 650 mg (has no administration in time range)  albuterol (PROVENTIL) (2.5 MG/3ML) 0.083% nebulizer solution 2.5 mg (has no administration in time range)  furosemide (LASIX) injection 20 mg (has no administration in time range)  potassium chloride (KLOR-CON) CR tablet 10 mEq (has no administration in time range)  oxyCODONE (Oxy IR/ROXICODONE) immediate release tablet 5 mg (has no administration in time range)  furosemide (LASIX) injection 40 mg (40 mg Intravenous Given 03/24/23 1700)    Mobility walks with device     Focused Assessments Pulmonary Assessment Handoff:  Lung sounds:   O2 Device: Room Air  R Recommendations: See Admitting Provider Note  Report given to: 3E06

## 2023-03-24 NOTE — ED Notes (Signed)
Pt was cleaned, peri care given, purewick in place, sacral drg applied when rolled, tol well.

## 2023-03-24 NOTE — ED Triage Notes (Signed)
Pt BIB [facility] from home d/t bil pedal edema & weeping. Pt reports her legs were normal 3 days ago & today they are as larger as they are. A/Ox4, 140/72, 54 bpm, 18 resp, 92% RA, no PIV upon arrival.

## 2023-03-24 NOTE — ED Provider Notes (Signed)
[facility] EMERGENCY DEPARTMENT AT [facility] Provider Note   CSN: [id number] Arrival date & time: 03/24/23  1318     History  No chief complaint on file.   Laurie James is a 77 y.o. female.  Patient with history of heart failure, leg edema (currently on Lasix Monday/Wednesday/Friday), A-fib on anticoagulation --presents to the emergency department today for evaluation of lower extremity edema.  She states that she has been compliant with her home medications including fluid pills.  Despite this, her legs have become more swollen over the past 3 days.  She states that she spends most of her time in bed but has not been able to get up because her legs have been swollen.  She states that the legs are generally "sensitive".  Denies fever, chest pain, shortness of breath.       Home Medications Prior to Admission medications   Medication Sig Start Date End Date Taking? Authorizing Provider  albuterol (VENTOLIN HFA) 108 (90 Base) MCG/ACT inhaler Inhale 2 puffs into the lungs every 4 (four) hours as needed for wheezing. 06/08/20   [provider]  amiodarone (PACERONE) 200 MG tablet Take 1 tablet (200 mg total) by mouth daily for 325 days, THEN 1 tablet (200 mg total) daily. 02/05/23 11/16/24  Zannie Cove, MD  apixaban Everlene Balls) 2.5 MG TABS tablet Take 1 tablet by mouth twice daily 01/30/23   Bensimhon, Bevelyn Buckles, MD  atorvastatin (LIPITOR) 40 MG tablet Stop taking until you see your PCP and obtain repeat labwork. Patient taking differently: Take 40 mg by mouth daily. Stop taking until you see your PCP and obtain repeat labwork. 01/31/23   Narda Bonds, MD  budesonide-formoterol (SYMBICORT) 80-4.5 MCG/ACT inhaler Inhale 2 puffs into the lungs in the morning and at bedtime. 10/30/22   Briant Cedar, MD  cyanocobalamin (,VITAMIN B-12,) 1000 MCG/ML injection Inject 1,000 mcg into the muscle every 30 (thirty) days. 06/11/16   [provider]   furosemide (LASIX) 20 MG tablet Take 2 tablets (40 mg total) by mouth every Monday, Wednesday, and Friday. Leg edema 03/02/23   Clegg, Amy D, NP  nitroGLYCERIN (NITROSTAT) 0.4 MG SL tablet Place 1 tablet (0.4 mg total) under the tongue every 5 (five) minutes x 3 doses as needed for chest pain. 10/11/20   Filbert Schilder, NP  ondansetron (ZOFRAN) 4 MG tablet Take 4 mg by mouth 3 (three) times daily as needed for nausea or vomiting. 11/22/20   [provider]  oxyCODONE (OXY IR/ROXICODONE) 5 MG immediate release tablet Take 1 tablet (5 mg total) by mouth every 6 (six) hours as needed for moderate pain (pain score 4-6). 02/05/23   Zannie Cove, MD  pantoprazole (PROTONIX) 40 MG tablet Take 1 tablet (40 mg total) by mouth 2 (two) times daily. 09/05/22   Bensimhon, Bevelyn Buckles, MD  potassium chloride (KLOR-CON) 10 MEQ tablet Take 1 tablet (10 mEq total) by mouth every Monday, Wednesday, and Friday. 01/12/23   Milford, Anderson Malta, FNP  senna-docusate (SENOKOT-S) 8.6-50 MG tablet Take 1 tablet by mouth at bedtime. 02/05/23   Zannie Cove, MD  SYNTHROID 75 MCG tablet Take 75 mcg by mouth daily before breakfast. 10/18/22   [provider]      Allergies    Codeine    Review of Systems   Review of Systems  Physical Exam Updated Vital Signs BP 111/60   Pulse (!) 56   Temp 97.8 F (36.6 C) (Oral)   Resp Marland Kitchen)  25   SpO2 94%  Physical Exam Vitals and nursing note reviewed.  Constitutional:      Appearance: She is well-developed. She is not diaphoretic.  HENT:     Head: Normocephalic and atraumatic.     Mouth/Throat:     Mouth: Mucous membranes are not dry.  Eyes:     Conjunctiva/sclera: Conjunctivae normal.  Neck:     Vascular: Normal carotid pulses. No JVD.     Trachea: Trachea normal. No tracheal deviation.  Cardiovascular:     Rate and Rhythm: Normal rate and regular rhythm.     Pulses: No decreased pulses.          Radial pulses are 2+ on the right side and 2+ on the left  side.     Heart sounds: Normal heart sounds, S1 normal and S2 normal. No murmur heard. Pulmonary:     Effort: Pulmonary effort is normal. No respiratory distress.     Breath sounds: No wheezing.  Chest:     Chest wall: No tenderness.  Abdominal:     General: Bowel sounds are normal.     Palpations: Abdomen is soft.     Tenderness: There is no abdominal tenderness. There is no guarding or rebound.  Musculoskeletal:        General: Normal range of motion.     Cervical back: Normal range of motion and neck supple. No muscular tenderness.     Right lower leg: Edema present.     Left lower leg: Edema present.     Comments: Patient with pitting edema, bilaterally from the top portions of her feet up until the proximal thigh bilaterally.  Does not extend to the abdomen wall.  Skin:    General: Skin is warm and dry.     Coloration: Skin is not pale.  Neurological:     Mental Status: She is alert.     ED Results / Procedures / Treatments   Labs (all labs ordered are listed, but only abnormal results are displayed) Labs Reviewed  CBC WITH DIFFERENTIAL/PLATELET - Abnormal; Notable for the following components:      Result Value   WBC 13.7 (*)    RBC 3.85 (*)    Hemoglobin 11.2 (*)    HCT 34.4 (*)    RDW 21.2 (*)    Platelets 411 (*)    Neutro Abs 12.0 (*)    Abs Immature Granulocytes 0.10 (*)    All other components within normal limits  COMPREHENSIVE METABOLIC PANEL - Abnormal; Notable for the following components:   Sodium 127 (*)    Chloride 97 (*)    Calcium 7.8 (*)    Total Protein 6.1 (*)    Albumin 1.8 (*)    AST 65 (*)    ALT 48 (*)    Total Bilirubin 1.2 (*)    All other components within normal limits  BRAIN NATRIURETIC PEPTIDE - Abnormal; Notable for the following components:   B Natriuretic Peptide 497.7 (*)    All other components within normal limits  TROPONIN I (HIGH SENSITIVITY) - Abnormal; Notable for the following components:   Troponin I (High  Sensitivity) 21 (*)    All other components within normal limits    EKG None  Radiology No results found.  Procedures Procedures    Medications Ordered in ED Medications - No data to display  ED Course/ Medical Decision Making/ A&P    Patient seen and examined. History obtained directly from patient.  Reviewed previous  cardiology notes.  Labs/EKG: Ordered CBC, CMP, BNP, troponin.  Imaging: Ordered chest x-ray  Medications/Fluids: None ordered  Initial impression: Lower extremity edema bilateral, fluid overload  5:04 PM Reassessment performed. Patient appears stable.  We discussed results.  She has concerns about going home as she cannot get out of bed because of the swelling in her legs.  Labs personally reviewed and interpreted including: CBC with elevated blood cell count of 13.7 but consistent with previous hospitalization, hemoglobin 11.2 better from recent baseline; CMP with mild hyponatremia at 127, creatinine 0.92 with normal BUN, mildly elevated transaminases; BNP elevated near 500.  Imaging personally visualized and interpreted including: Chest x-ray agree moderate right-sided pleural effusion, mild left-sided pleural effusion, overall fairly stable since October.  Reviewed pertinent lab work and imaging with patient at bedside. Questions answered.   Most current vital signs reviewed and are as follows: BP 111/60   Pulse (!) 56   Temp 97.8 F (36.6 C) (Oral)   Resp (!) 25   SpO2 94%   Plan: Will request admission to hospital for diuresis.  Lasix 40 mg ordered.  I have spoken with Dr. Lajuana Ripple of [facility] hospitalist who will see for admission.                                 Medical Decision Making Amount and/or Complexity of Data Reviewed Labs: ordered. Radiology: ordered.  Risk Prescription drug management. Decision regarding hospitalization.   Patient with history of CHF with worsening lower extremity edema causing functional difficulties at  home.  This is despite compliance with her current home regimen.  Overall low concern for pneumonia as patient has not had fevers or other respiratory symptoms.  Breathing seems to be fairly stable.  No signs of lower extremity cellulitis.  Do not suspect sepsis.        Final Clinical Impression(s) / ED Diagnoses Final diagnoses:  Pedal edema  Pleural effusion  Acute congestive heart failure, unspecified heart failure type [facility])    Rx / DC Orders ED Discharge Orders     None         Renne Crigler, PA-C 03/24/23 1707    Ernie Avena, MD 03/24/23 2001

## 2023-03-25 DIAGNOSIS — F32A Depression, unspecified: Secondary | ICD-10-CM | POA: Diagnosis present

## 2023-03-25 DIAGNOSIS — J90 Pleural effusion, not elsewhere classified: Secondary | ICD-10-CM | POA: Diagnosis present

## 2023-03-25 DIAGNOSIS — G8929 Other chronic pain: Secondary | ICD-10-CM | POA: Diagnosis present

## 2023-03-25 DIAGNOSIS — L89320 Pressure ulcer of left buttock, unstageable: Secondary | ICD-10-CM | POA: Diagnosis present

## 2023-03-25 DIAGNOSIS — I272 Pulmonary hypertension, unspecified: Secondary | ICD-10-CM | POA: Diagnosis present

## 2023-03-25 DIAGNOSIS — E43 Unspecified severe protein-calorie malnutrition: Secondary | ICD-10-CM

## 2023-03-25 DIAGNOSIS — I495 Sick sinus syndrome: Secondary | ICD-10-CM | POA: Diagnosis present

## 2023-03-25 DIAGNOSIS — M109 Gout, unspecified: Secondary | ICD-10-CM | POA: Diagnosis present

## 2023-03-25 DIAGNOSIS — K761 Chronic passive congestion of liver: Secondary | ICD-10-CM | POA: Diagnosis present

## 2023-03-25 DIAGNOSIS — I071 Rheumatic tricuspid insufficiency: Secondary | ICD-10-CM | POA: Diagnosis present

## 2023-03-25 DIAGNOSIS — N1831 Chronic kidney disease, stage 3a: Secondary | ICD-10-CM | POA: Diagnosis present

## 2023-03-25 DIAGNOSIS — E876 Hypokalemia: Secondary | ICD-10-CM | POA: Diagnosis not present

## 2023-03-25 DIAGNOSIS — B370 Candidal stomatitis: Secondary | ICD-10-CM | POA: Diagnosis present

## 2023-03-25 DIAGNOSIS — L89150 Pressure ulcer of sacral region, unstageable: Secondary | ICD-10-CM | POA: Diagnosis present

## 2023-03-25 DIAGNOSIS — E871 Hypo-osmolality and hyponatremia: Secondary | ICD-10-CM | POA: Diagnosis present

## 2023-03-25 DIAGNOSIS — H3530 Unspecified macular degeneration: Secondary | ICD-10-CM | POA: Diagnosis present

## 2023-03-25 DIAGNOSIS — I2510 Atherosclerotic heart disease of native coronary artery without angina pectoris: Secondary | ICD-10-CM | POA: Diagnosis not present

## 2023-03-25 DIAGNOSIS — G4733 Obstructive sleep apnea (adult) (pediatric): Secondary | ICD-10-CM | POA: Diagnosis present

## 2023-03-25 DIAGNOSIS — E785 Hyperlipidemia, unspecified: Secondary | ICD-10-CM

## 2023-03-25 DIAGNOSIS — I2583 Coronary atherosclerosis due to lipid rich plaque: Secondary | ICD-10-CM

## 2023-03-25 DIAGNOSIS — E039 Hypothyroidism, unspecified: Secondary | ICD-10-CM | POA: Diagnosis present

## 2023-03-25 DIAGNOSIS — L89310 Pressure ulcer of right buttock, unstageable: Secondary | ICD-10-CM | POA: Diagnosis present

## 2023-03-25 DIAGNOSIS — I5033 Acute on chronic diastolic (congestive) heart failure: Secondary | ICD-10-CM | POA: Diagnosis present

## 2023-03-25 DIAGNOSIS — J439 Emphysema, unspecified: Secondary | ICD-10-CM | POA: Diagnosis present

## 2023-03-25 DIAGNOSIS — Z66 Do not resuscitate: Secondary | ICD-10-CM | POA: Diagnosis present

## 2023-03-25 DIAGNOSIS — S72001D Fracture of unspecified part of neck of right femur, subsequent encounter for closed fracture with routine healing: Secondary | ICD-10-CM | POA: Diagnosis not present

## 2023-03-25 DIAGNOSIS — G3184 Mild cognitive impairment, so stated: Secondary | ICD-10-CM | POA: Diagnosis present

## 2023-03-25 DIAGNOSIS — I480 Paroxysmal atrial fibrillation: Secondary | ICD-10-CM | POA: Diagnosis present

## 2023-03-25 LAB — BASIC METABOLIC PANEL
Anion gap: 9 (ref 5–15)
BUN: 13 mg/dL (ref 8–23)
CO2: 22 mmol/L (ref 22–32)
Calcium: 7.6 mg/dL — ABNORMAL LOW (ref 8.9–10.3)
Chloride: 98 mmol/L (ref 98–111)
Creatinine, Ser: 1.11 mg/dL — ABNORMAL HIGH (ref 0.44–1.00)
GFR, Estimated: 51 mL/min — ABNORMAL LOW (ref >60)
Glucose, Bld: 86 mg/dL (ref 70–99)
Potassium: 3.7 mmol/L (ref 3.5–5.1)
Sodium: 129 mmol/L — ABNORMAL LOW (ref 135–145)

## 2023-03-25 MED ORDER — MEGESTROL ACETATE 40 MG PO TABS: 40.0000 mg | ORAL_TABLET | Freq: Every day | ORAL | Status: DC

## 2023-03-25 MED ORDER — MEGESTROL ACETATE 40 MG PO TABS
160.0000 mg | ORAL_TABLET | Freq: Every day | ORAL | Status: AC
  Administered 2023-03-25 – 2023-03-27 (×3): 160 mg via ORAL
  Filled 2023-03-25 (×4): qty 4

## 2023-03-25 MED ORDER — FUROSEMIDE 10 MG/ML IJ SOLN
40.0000 mg | Freq: Once | INTRAMUSCULAR | Status: AC
  Administered 2023-03-25: 40 mg via INTRAVENOUS
  Filled 2023-03-25: qty 4

## 2023-03-25 MED ORDER — FUROSEMIDE 10 MG/ML IJ SOLN
60.0000 mg | Freq: Two times a day (BID) | INTRAMUSCULAR | Status: DC
  Administered 2023-03-25 – 2023-03-29 (×8): 60 mg via INTRAVENOUS
  Filled 2023-03-25 (×8): qty 6

## 2023-03-25 MED ORDER — OXYCODONE HCL 5 MG PO TABS
5.0000 mg | ORAL_TABLET | ORAL | Status: DC | PRN
  Administered 2023-03-26: 5 mg via ORAL
  Filled 2023-03-25 (×2): qty 1

## 2023-03-25 MED ORDER — SODIUM CHLORIDE 0.9 % IV SOLN
12.5000 mg | Freq: Four times a day (QID) | INTRAVENOUS | Status: DC | PRN
  Administered 2023-03-25: 12.5 mg via INTRAVENOUS
  Filled 2023-03-25: qty 12.5

## 2023-03-25 NOTE — Assessment & Plan Note (Addendum)
Patient very deconditioned with poor oral intake.  Serum albumin is 1,8   Trial of megestrol for 3 days.  Plan to consult nutrition for further recommendations.

## 2023-03-25 NOTE — Care Management Obs Status (Signed)
MEDICARE OBSERVATION STATUS NOTIFICATION   Patient Details  Name: Laurie James MRN: [id number] Date of Birth: 03/14/1946   Medicare Observation Status Notification Given:  Yes    Lawerance Sabal, RN 03/25/2023, 2:24 PM

## 2023-03-25 NOTE — Hospital Course (Signed)
Laurie James was admitted to the hospital with heart failure exacerbation   77 yo female with the past medical history of COPD, paroxysmal atrial fibrillation, heart failure, and chronic back pain who presented with bilateral lower extremity edema.  Recent hospitalization for right hip replacement, 10/23 to 02/05/23 transferred to SNF for short term rehab. She did require PRBC transfusion 2 units and was discharge on her usual regimen of diuretic therapy with furosemide 20 mg tid per week.  Since her discharge from SNF she continue with poor mobility. Noted to have 7 days of worsening lower extremity edema. Apparently she has been not adherent with her medications. On her initial physical examination her blood pressure was 110/65, HR 98, RR 23 and 02 saturation 93%, lungs with decreased breath sounds at bases with no wheezing, heart with S1 and S2 present and regular with no murmurs or gallops, positive bilateral lower extremity edema.   Chest radiograph with right rotation, positive scoliosis, mild cardiomegaly and positive hyperinflation, bilateral hilar vascular congestion with bilateral pleural effusions, more right than left.   EKG 55 bpm, normal axis, qtc 577, sinus rhythm with no significant ST segment, negative T wave V2 to V3.   Patient was placed furosemide for diuresis.   12/16 patient responding well to diuresis.  12/17 volume status improving but not yet back to baseline. Will need SNF at the time of discharge. Added Unna boots.

## 2023-03-25 NOTE — Assessment & Plan Note (Addendum)
Echocardiogram with preserved LV systolic function with EF 55 to 60%, RV systolic function preserved, RV with mild enlargement, LA and RA with severe dilatation, severe tricuspid valve regurgitation,   Documented urine output 950 ml Systolic blood pressure 105 mmHg range.   Plan to increase furosemide to 60 mg IV bid Limited pharmacologic options due to risk of hypotension. \ Check limited echocardiogram.

## 2023-03-25 NOTE — Consult Note (Signed)
WOC Nurse Consult Note: Reason for Consult: Unstageable pressure injury to sacrum, upper buttocks bilaterally.  Recent hip replacement Wound type: unstageable pressure injury Pressure Injury POA: Yes Measurement: 2cm x 4 cm to left buttocks and sacrum.  Right buttocks 4.5 cm x 3 cm maroon discoloration, peeling epithelium Wound bed: maroon discoloration, peeling epithelium Drainage (amount, consistency, odor) scant weeping Periwound: intact, incontinent Dressing procedure/placement/frequency: Will order low air loss mattress for pressure redistribution.  CLenase breakdown to left and right buttocks and sacrum with soap and water and pat dry. Apply Xeroform to wound bed and secure with silicone foam.  Turn and reposition every two hours.   Will not follow at this time.  Please re-consult if needed.  Mike Gip MSN, RN, FNP-BC CWON Wound, Ostomy, Continence Nurse [facility] [phone] Pager [phone]

## 2023-03-25 NOTE — Progress Notes (Signed)
Progress Note   Patient: Laurie James ZOX:[id number] DOB: Dec 07, 1945 DOA: 03/24/2023     0 DOS: the patient was seen and examined on 03/25/2023   Brief hospital course: [name] was admitted to the hospital with heart failure exacerbation   77 yo female with the past medical history of COPD, paroxysmal atrial fibrillation, heart failure, and chronic back pain who presented with bilateral lower extremity edema.  Recent hospitalization for right hip replacement, 10/23 to 02/05/23 transferred to SNF for short term rehab. She did require PRBC transfusion 2 units and was discharge on her usual regimen of diuretic therapy with furosemide 20 mg tid per week.  Since her discharge from SNF she continue with poor mobility. Noted to have 7 days of worsening lower extremity edema. Apparently she has been not adherent with her medications. On her initial physical examination her blood pressure was 110/65, HR 98, RR 23 and 02 saturation 93%, lungs with decreased breath sounds at bases with no wheezing, heart with S1 and S2 present and regular with no murmurs or gallops, positive bilateral lower extremity edema.   Chest radiograph with right rotation, positive scoliosis, mild cardiomegaly and positive hyperinflation, bilateral hilar vascular congestion with bilateral pleural effusions, more right than left.   EKG 55 bpm, normal axis, qtc 577, sinus rhythm with no significant ST segment, negative T wave V2 to V3.   Patient was placed furosemide for diuresis.     Assessment and Plan: * Acute on chronic diastolic CHF (congestive heart failure) (HCC) Echocardiogram with preserved LV systolic function with EF 55 to 60%, RV systolic function preserved, RV with mild enlargement, LA and RA with severe dilatation, severe tricuspid valve regurgitation,   Documented urine output 950 ml Systolic blood pressure 105 mmHg range.   Plan to increase furosemide to 60 mg IV bid Limited pharmacologic  options due to risk of hypotension. \ Check limited echocardiogram.   Paroxysmal atrial fibrillation (HCC) Continue rate control with amiodarone and anticoagulation with apixaban.   Chronic kidney disease, stage 3a (HCC) Hyponatremia.   Renal functio with serum cr at 1.1 with K at 3,7 and serum bicarbonate at 22.  Na 129  Continue diuresis with furosemide.   HLD (hyperlipidemia) Continue statin therapy.   CAD (coronary artery disease) No chest pain, no acute coronary syndrome.   Hypothyroidism Continue with levothyroxine.   Protein-calorie malnutrition, severe Patient very deconditioned with poor oral intake.  Serum albumin is 1,8   Trial of megestrol for 3 days.  Plan to consult nutrition for further recommendations.         Subjective: Patient continue to have edema of the lower extremities, continue very weak and deconditioned   Physical Exam: Vitals:   03/25/23 0519 03/25/23 0721 03/25/23 0732 03/25/23 1118  BP:  (!) 88/57  (!) 106/58  Pulse:  60  66  Resp:  20  (!) 23  Temp:  (!) 97.3 F (36.3 C)  98 F (36.7 C)  TempSrc:  Oral  Oral  SpO2:  91% 92% 92%  Weight: 52.4 kg     Height:       Neurology awake and alert ENT with mild pallor with no icterus Cardiovascular with S1 and S2 present and regular with no gallops or rubs, positive systolic murmur at the right lower sternal border  Mild JVD Positive lower extremity edema ++ Respiratory with bilateral rales and scattered rhonchi with no wheezing Abdomen with no distention  Data Reviewed:    Family Communication: no  family at the bedside   Disposition: Status is: Observation The patient will require care spanning > 2 midnights and should be moved to inpatient because: IV diuresis   Planned Discharge Destination: Home      Author: Coralie Keens, MD 03/25/2023 2:05 PM  For on call review [email or web address].

## 2023-03-25 NOTE — Assessment & Plan Note (Signed)
No chest pain, no acute coronary syndrome.  

## 2023-03-25 NOTE — Assessment & Plan Note (Addendum)
Hyponatremia. Hypokalemia.   Today renal function with serum cr at 0,98 with K at 4.7 and serum bicarbonate at 25  Na 131 Mg 2.2   Continue diuresis with furosemide and spironolactone.  Follow up renal function and electrolytes in am.

## 2023-03-25 NOTE — Plan of Care (Signed)

## 2023-03-25 NOTE — Assessment & Plan Note (Signed)
Continue with levothyroxine  

## 2023-03-25 NOTE — Assessment & Plan Note (Signed)
Continue rate control with amiodarone and anticoagulation with apixaban.  Continue telemetry monitoring.  

## 2023-03-25 NOTE — Assessment & Plan Note (Signed)
Continue statin therapy.

## 2023-03-26 ENCOUNTER — Inpatient Hospital Stay (HOSPITAL_COMMUNITY): Payer: Medicare HMO

## 2023-03-26 ENCOUNTER — Encounter (HOSPITAL_COMMUNITY): Payer: Medicare PPO

## 2023-03-26 DIAGNOSIS — I480 Paroxysmal atrial fibrillation: Secondary | ICD-10-CM | POA: Diagnosis not present

## 2023-03-26 DIAGNOSIS — I5033 Acute on chronic diastolic (congestive) heart failure: Secondary | ICD-10-CM

## 2023-03-26 DIAGNOSIS — N1831 Chronic kidney disease, stage 3a: Secondary | ICD-10-CM | POA: Diagnosis not present

## 2023-03-26 DIAGNOSIS — E785 Hyperlipidemia, unspecified: Secondary | ICD-10-CM | POA: Diagnosis not present

## 2023-03-26 LAB — BASIC METABOLIC PANEL
Anion gap: 8 (ref 5–15)
BUN: 13 mg/dL (ref 8–23)
CO2: 24 mmol/L (ref 22–32)
Calcium: 7.2 mg/dL — ABNORMAL LOW (ref 8.9–10.3)
Chloride: 95 mmol/L — ABNORMAL LOW (ref 98–111)
Creatinine, Ser: 1.03 mg/dL — ABNORMAL HIGH (ref 0.44–1.00)
GFR, Estimated: 56 mL/min — ABNORMAL LOW (ref >60)
Glucose, Bld: 90 mg/dL (ref 70–99)
Potassium: 3.1 mmol/L — ABNORMAL LOW (ref 3.5–5.1)
Sodium: 127 mmol/L — ABNORMAL LOW (ref 135–145)

## 2023-03-26 LAB — ECHOCARDIOGRAM LIMITED
Est EF: 50
Height: 56 in
Weight: 1901.25 [oz_av]

## 2023-03-26 LAB — MAGNESIUM: Magnesium: 1.8 mg/dL (ref 1.7–2.4)

## 2023-03-26 MED ORDER — ENSURE ENLIVE PO LIQD
237.0000 mL | Freq: Two times a day (BID) | ORAL | Status: DC
  Administered 2023-03-26 – 2023-04-02 (×10): 237 mL via ORAL

## 2023-03-26 MED ORDER — MAGNESIUM SULFATE 2 GM/50ML IV SOLN
2.0000 g | Freq: Once | INTRAVENOUS | Status: AC
  Administered 2023-03-26: 2 g via INTRAVENOUS
  Filled 2023-03-26: qty 50

## 2023-03-26 MED ORDER — PERFLUTREN LIPID MICROSPHERE
1.0000 mL | INTRAVENOUS | Status: AC | PRN
Start: 2023-03-26 — End: 2023-03-26
  Administered 2023-03-26: 2 mL via INTRAVENOUS

## 2023-03-26 MED ORDER — POTASSIUM CHLORIDE CRYS ER 20 MEQ PO TBCR
40.0000 meq | EXTENDED_RELEASE_TABLET | ORAL | Status: AC
Start: 2023-03-26 — End: 2023-03-26
  Administered 2023-03-26 (×3): 40 meq via ORAL
  Filled 2023-03-26 (×3): qty 2

## 2023-03-26 MED ORDER — ADULT MULTIVITAMIN W/MINERALS CH
1.0000 | ORAL_TABLET | Freq: Every day | ORAL | Status: DC
  Administered 2023-03-26 – 2023-04-02 (×8): 1 via ORAL
  Filled 2023-03-26 (×8): qty 1

## 2023-03-26 NOTE — Progress Notes (Signed)
Progress Note   Patient: Laurie James RUE:[id number] DOB: 02-Sep-1945 DOA: 03/24/2023     1 DOS: the patient was seen and examined on 03/26/2023   Brief hospital course: Mrs. Boga was admitted to the hospital with heart failure exacerbation   77 yo female with the past medical history of COPD, paroxysmal atrial fibrillation, heart failure, and chronic back pain who presented with bilateral lower extremity edema.  Recent hospitalization for right hip replacement, 10/23 to 02/05/23 transferred to SNF for short term rehab. She did require PRBC transfusion 2 units and was discharge on her usual regimen of diuretic therapy with furosemide 20 mg tid per week.  Since her discharge from SNF she continue with poor mobility. Noted to have 7 days of worsening lower extremity edema. Apparently she has been not adherent with her medications. On her initial physical examination her blood pressure was 110/65, HR 98, RR 23 and 02 saturation 93%, lungs with decreased breath sounds at bases with no wheezing, heart with S1 and S2 present and regular with no murmurs or gallops, positive bilateral lower extremity edema.   Chest radiograph with right rotation, positive scoliosis, mild cardiomegaly and positive hyperinflation, bilateral hilar vascular congestion with bilateral pleural effusions, more right than left.   EKG 55 bpm, normal axis, qtc 577, sinus rhythm with no significant ST segment, negative T wave V2 to V3.   Patient was placed furosemide for diuresis.   12/16 patient responding well to diuresis.   Assessment and Plan: * Acute on chronic diastolic CHF (congestive heart failure) (HCC) Echocardiogram (limited) with preserved LV systolic function with EF 50%, global hypokinesis, D shaped interventricular septum in systole and diastole, RV systolic function with mild reduction, RVSP 43.7 mmHg, RA with severe dilatation, moderate to severe tricuspid valve regurgitation, moderate pulmonary  regurgitation,   Acute on chronic core pulmonale Pulmonary hypertension  RV failure.   Documented urine output 1,200  ml Systolic blood pressure 100 mmHg range.   Continue with furosemide to 60 mg IV bid Add spironolactone 12.5 mg daily.  Limited pharmacologic options due to risk of hypotension.  Check [place] lower extremities rule out DVT.   Acute hypoxemic respiratory failure due to acute cardiogenic pulmonary edema.  02 saturation is 92% on 2 L/min per [place].   Paroxysmal atrial fibrillation (HCC) Continue rate control with amiodarone and anticoagulation with apixaban.   Chronic kidney disease, stage 3a (HCC) Hyponatremia. Hypokalemia.   Renal function with serum cr at 1,0 with K at 3,1 and serum bicarbonate at 24  Na 127 Mg 1,8   Plan to add KCl 40 meq x2 and 2 g Mag sulfate.  Follow up renal function and electrolytes in am.  Continue diuresis with furosemide.   HLD (hyperlipidemia) Continue statin therapy.   CAD (coronary artery disease) No chest pain, no acute coronary syndrome.   Hypothyroidism Continue with levothyroxine.   Protein-calorie malnutrition, severe Patient very deconditioned with poor oral intake.  Serum albumin is 1,8   Trial of megestrol for 3 days.  Plan to consult nutrition for further recommendations.       Subjective: Patient with improvement in dyspnea and edema, not yet back to baseline, no chest pain.   Physical Exam: Vitals:   03/26/23 0721 03/26/23 0845 03/26/23 1120 03/26/23 1530  BP: 99/62  (!) 94/54 (!) 105/56  Pulse: 69  61 60  Resp: 18  20 20   Temp: 98 F (36.7 C)  98 F (36.7 C) 98.3 F (36.8 C)  TempSrc:  Oral  Oral Oral  SpO2: 91% 92% 95% 96%  Weight:      Height:       Neurology awake and alert ENT with mild pallor Cardiovascular with S1 and S2 present and regular with positive systolic murmur at the right lower sternal border Positive JVD Positive lower extremity edema +  Respiratory with mild rales at bases with  no wheezing or rhonchi Abdomen with no distention  Data Reviewed:    Family Communication: no family at the bedside   Disposition: Status is: Inpatient Remains inpatient appropriate because: IV diuresis   Planned Discharge Destination: Home     Author: Coralie Keens, MD 03/26/2023 3:37 PM  For on call review [email or web address].

## 2023-03-26 NOTE — TOC Initial Note (Addendum)
Transition of Care [facility]) - Initial/Assessment Note    Patient Details  Name: Laurie James MRN: [id number] Date of Birth: 03/13/1946  Transition of Care [facility] CM/SW Contact:    Leone Haven, RN Phone Number: 03/26/2023, 12:32 PM  Clinical Narrative:                 From home with spouse, has PCP, Dr. Donette Larry,  and insurance on file, states has [facility] with someone but does not know the name of agency,  NCM looked up and it is with [facility], she would like to [facility] with [facility].   States she will need ambulance at dc to go home at [employer] and family is support system, spouse is there withe her  at all times, states gets medications from [facility] in [place].  Pta self ambulatory. Await PT eval.  Per OT eval rec SNF.  Expected Discharge Plan: Home w Home Health Services Barriers to Discharge: Continued Medical Work up   Patient Goals and CMS Choice Patient states their goals for this hospitalization and ongoing recovery are:: get better   Choice offered to / list presented to : NA      Expected Discharge Plan and Services   Discharge Planning Services: CM Consult Post Acute Care Choice: NA Living arrangements for the past 2 months: Single Family Home                 DME Arranged: N/A DME Agency: NA       HH Arranged: NA          Prior Living Arrangements/Services Living arrangements for the past 2 months: Single Family Home Lives with:: Spouse Patient language and need for interpreter reviewed:: Yes Do you feel safe going back to the place where you live?: Yes      Need for Family Participation in Patient Care: Yes (Comment) Care giver support system in place?: Yes (comment) Current home services:  (hosp bed ,walker , w/chair) Criminal Activity/Legal Involvement Pertinent to Current Situation/Hospitalization: No - Comment as needed  Activities of Daily Living      Permission Sought/Granted Permission sought to share information with : Case  Manager Permission granted to share information with : Yes, Verbal Permission Granted              Emotional Assessment Appearance:: Appears stated age Attitude/Demeanor/Rapport: Engaged Affect (typically observed): Appropriate Orientation: : Oriented to Self, Oriented to Place, Oriented to  Time, Oriented to Situation Alcohol / Substance Use: Not Applicable Psych Involvement: No (comment)  Admission diagnosis:  Pleural effusion [J90] Pedal edema [R60.0] Diastolic CHF (HCC) [I50.30] Acute congestive heart failure, unspecified heart failure type (HCC) [I50.9] Heart failure (HCC) [I50.9] Patient Active Problem List   Diagnosis Date Noted   Diastolic CHF (HCC) 03/24/2023   Malnutrition of moderate degree 02/03/2023   Bradycardia 02/01/2023   Hip fracture (HCC) 01/31/2023   Acute exacerbation of CHF (congestive heart failure) (HCC) 01/30/2023   Hypothyroidism 01/30/2023   Community acquired pneumonia 12/27/2022   Acute CHF (congestive heart failure) (HCC) 10/27/2022   Chronic kidney disease, stage 3a (HCC) 09/08/2022   Heart failure (HCC) 09/06/2022   Chronic diastolic CHF (congestive heart failure) (HCC) 09/05/2022   GERD without esophagitis 05/02/2022   Gout 05/02/2022   Left lower quadrant abdominal pain 05/02/2022   Elevated LFTs 05/02/2022   Hyponatremia 05/02/2022   Recurrent right pleural effusion 05/01/2022   Paroxysmal atrial fibrillation (HCC) 05/01/2022   Secondary hypercoagulable state (HCC) 04/04/2022   Chronic  systolic CHF (congestive heart failure) (HCC) 11/28/2021   Macular degeneration 11/28/2021   MCI (mild cognitive impairment) 11/28/2021   OSA (obstructive sleep apnea)    Protein-calorie malnutrition, severe 10/22/2020   Depression    Acute on chronic diastolic CHF (congestive heart failure) (HCC)    Bilateral pleural effusion 10/19/2020   Pulmonary HTN (HCC) 10/11/2020   Tricuspid regurgitation 10/11/2020   HLD (hyperlipidemia) 10/11/2020    Hypotension 10/11/2020   Hypokalemia 10/11/2020   Chest pain at rest 10/05/2020   CAD (coronary artery disease) 04/24/2014   COPD with acute exacerbation (HCC) 04/24/2014   PCP:  Georgann Housekeeper, MD Pharmacy:   [facility] [address], [place] - [address] [facility] [place] [address] Phone: [phone] Fax: [phone]     Social Drivers of Health (SDOH) Social History: SDOH Screenings   Food Insecurity: No Food Insecurity (03/25/2023)  Housing: Low Risk  (03/25/2023)  Transportation Needs: No Transportation Needs (03/25/2023)  Utilities: Not At Risk (03/25/2023)  Tobacco Use: Medium Risk (03/01/2023)   SDOH Interventions:     Readmission Risk Interventions    01/31/2023    3:16 PM 09/07/2022    3:25 PM  Readmission Risk Prevention Plan  Transportation Screening Complete Complete  PCP or Specialist Appt within 5-7 Days  Complete  PCP or Specialist Appt within 3-5 Days Complete   Home Care Screening  Complete  Medication Review (RN CM)  Complete  HRI or Home Care Consult Complete   Palliative Care Screening Not Applicable   Medication Review (RN Care Manager) Complete

## 2023-03-26 NOTE — Progress Notes (Signed)
Heart Failure Navigator Progress Note  Assessed for Heart & Vascular TOC clinic readiness.  Patient does not meet criteria due to Advanced Heart Failure Team patient of Dr. Bensimhon.   Navigator will sign off at this time.   Lucilia Yanni, BSN, RN Heart Failure Nurse Navigator Secure Chat Only   

## 2023-03-26 NOTE — Progress Notes (Signed)
Initial Nutrition Assessment  DOCUMENTATION CODES:   Severe malnutrition in context of chronic illness  INTERVENTION:  Liberalize diet to regular to promote adequate PO intake Assist with meal set up and feeding as needed Ensure Enlive po BID (vanilla), each supplement provides 350 kcal and 20 grams of protein. Magic cup TID with meals, each supplement provides 290 kcal and 9 grams of protein MVI with minerals daily  NUTRITION DIAGNOSIS:   Severe Malnutrition related to chronic illness (CHF, COPD) as evidenced by energy intake < or equal to 75% for > or equal to 1 month, moderate fat depletion, severe muscle depletion.  GOAL:   Patient will meet greater than or equal to 90% of their needs  MONITOR:   PO intake, Supplement acceptance, Labs, Weight trends, Skin  REASON FOR ASSESSMENT:   Consult Assessment of nutrition requirement/status  ASSESSMENT:   Pt admitted with worsening bilateral leg swelling. PMH significant for COPD, paroxysmal afib, chronic diastolic CHF/bilateral pleural effusions, osteoporosis, age related macular degeneration, recently admitted for R hip replacement.  RD familiar with patient from multiple prior admissions. During admission in September, pt reporting consuming little protein, fruits and vegetables unless well cooked d/t difficulty with chewing and digestion. She is edentulous and does not always wear her dentures. Pt has thrush which she reports is getting better but is not bothersome to her intake.   Pt has primarily been bed bound since discharge from rehab facility.  Drinking lots of fluids/gatorade d/t dry mouth per review of family report on admission.   Pt sitting in bedside recliner at time of visit, no family present. She reports that her appetite is slightly better than it has been. Since last admission, she reports that her portions have been very small. She was eating 3 small meals at rehab where she was for 20 days and then returned home  where she reports that she was eating a little more than she has been but no significant improvement. Her breakfast still consists of rice krispies with sugar. She reports that she was primarily eating corn dogs and consumes 1 Ensure/Boost daily.   MD ordered megace for 3 days. Discussed extending order as appetite stimulants usually take a couple weeks to take effect.   Pt reports that her most recent known weight was 115 lbs which is elevated d/t LE edema. Prior documented weights over the last year have ranged from 47-50 kg.   Medications: lasix 60mg  q12h, megace, nystatin, protonix BID, klor con q4h   Labs: sodium 127, potassium 3.1, Cr 1.03, GFR 56  NUTRITION - FOCUSED PHYSICAL EXAM: Compared to prior nutrition focused physical exams, pt's muscle and fat deficits have become more significant suggestive of ongoing undernutrition.  Flowsheet Row Most Recent Value  Orbital Region Moderate depletion  Upper Arm Region Severe depletion  Thoracic and Lumbar Region Moderate depletion  Buccal Region Mild depletion  Temple Region Severe depletion  Clavicle Bone Region Severe depletion  Clavicle and Acromion Bone Region Severe depletion  Scapular Bone Region Severe depletion  Dorsal Hand Severe depletion  Patellar Region Severe depletion  Anterior Thigh Region Severe depletion  Posterior Calf Region Unable to assess  [wrapped in bandages,  weeping per RN]  Edema (RD Assessment) Unable to assess  [deep pitting edem per RN report]  Hair Reviewed  Eyes Other (Comment)  [age related macular degeneration,  blind]  Mouth Other (Comment)  [thrush,  edentulous]  Skin Reviewed  Nails Reviewed       Diet Order:  Diet Order             Diet regular Room service appropriate? Yes with Assist; Fluid consistency: Thin  Diet effective now                   EDUCATION NEEDS:   Education needs have been addressed  Skin:  Skin Assessment: Skin Integrity Issues: Skin Integrity Issues::  Unstageable Unstageable: Per WOC (12/15):  2cm x 4 cm to left buttocks and sacrum.  Right buttocks 4.5 cm x 3 cm maroon discoloration, peeling epithelium  Last BM:  12/13  Height:   Ht Readings from Last 1 Encounters:  03/24/23 4\' 8"  (1.422 m)    Weight:   Wt Readings from Last 1 Encounters:  03/26/23 53.9 kg   BMI:  Body mass index is 26.64 kg/m.  Estimated Nutritional Needs:   Kcal:  1300-1500  Protein:  65-80g  Fluid:  1.3-1.5L  Drusilla Kanner, RDN, LDN Clinical Nutrition

## 2023-03-26 NOTE — Evaluation (Signed)
Physical Therapy Evaluation Patient Details Name: Laurie James MRN: [id number] DOB: 15-May-1945 Today's Date: 03/26/2023  History of Present Illness  Pt is a 77 y.o. female presenting 12/14 with BLE edema. PMH significant for COPD, paroxysmal a-fib on anticoagulation, CGF with bil pleural effisions, osteoporosis, macular degeneration, back pain, R hip fx s/p IM nailing in October 2024.   Clinical Impression  Laurie James is 77 y.o. female admitted with above HPI and diagnosis. Patient is currently limited by functional impairments below (see PT problem list). Patient lives with spouse and recently returned home from SNF and has required significant assist for mobility and ADL's from spouse. Currently she requires Mod+2 assist for bed mobility and transfers with [facility]. Pt functionally weak and upon standing pt eager to sit as soon as able. With assist and cues pt able to step chair>bed for return to supine after ~1.5 hours OOB. Patient will benefit from continued skilled PT interventions to address impairments and progress independence with mobility. Patient will benefit from continued inpatient follow up therapy, <3 hours/day. Acute PT will follow and progress as able.         If plan is discharge home, recommend the following: Two people to help with walking and/or transfers;A lot of help with bathing/dressing/bathroom;Assistance with cooking/housework;Direct supervision/assist for medications management;Assist for transportation;Help with stairs or ramp for entrance   Can travel by private vehicle   No    Equipment Recommendations  (tbd at next venue)  Recommendations for Other Services       Functional Status Assessment Patient has had a recent decline in their functional status and demonstrates the ability to make significant improvements in function in a reasonable and predictable amount of time.     Precautions / Restrictions Precautions Precautions:  Fall Precaution Comments: fragile sensitive skin Restrictions Weight Bearing Restrictions Per Provider Order: No      Mobility  Bed Mobility Overal bed mobility: Needs Assistance Bed Mobility: Sit to Supine       Sit to supine: Mod assist, +2 for safety/equipment, +2 for physical assistance   General bed mobility comments: assist to bring bil LE's onto bed and control lowering trunk, +2    Transfers Overall transfer level: Needs assistance Equipment used: 2 person hand held assist Transfers: Sit to/from Stand, Bed to chair/wheelchair/BSC Sit to Stand: Mod assist, +2 physical assistance, +2 safety/equipment Stand pivot transfers: Mod assist, +2 physical assistance, +2 safety/equipment         General transfer comment: Mod A +2 for rise. Cues for step sequence and guide turn chair>bed.    Ambulation/Gait                  Stairs            Wheelchair Mobility     Tilt Bed    Modified Rankin (Stroke Patients Only)       Balance Overall balance assessment: Needs assistance Sitting-balance support: Single extremity supported, Feet supported Sitting balance-Leahy Scale: Poor Sitting balance - Comments: CGA with cues for maintaining   Standing balance support: Bilateral upper extremity supported, During functional activity Standing balance-Leahy Scale: Poor Standing balance comment: reliant on asist                             Pertinent Vitals/Pain Pain Assessment Pain Assessment: Faces Faces Pain Scale: Hurts even more Pain Location: RLE, BLE, bottom Pain Descriptors / Indicators: Aching, Discomfort, Grimacing, Guarding Pain  Intervention(s): Limited activity within patient's tolerance, Monitored during session, Repositioned    Home Living Family/patient expects to be discharged to:: Private residence Living Arrangements: Spouse/significant other Available Help at Discharge: Family;Available 24 hours/day (daughter and son live  nearby) Type of Home: House Home Access: Stairs to enter;Ramped entrance Entrance Stairs-Rails: None Entrance Stairs-Number of Steps: 3 Alternate Level Stairs-Number of Steps: flight Home Layout: Two level;Able to live on main level with bedroom/bathroom Home Equipment: Rolling Walker (2 wheels);Cane - single point;Shower seat;Wheelchair - manual;Grab bars - tub/shower;Hospital bed;BSC/3in1      Prior Function Prior Level of Function : Needs assist       Physical Assist : Mobility (physical);ADLs (physical) Mobility (physical): Gait;Stairs ADLs (physical): Bathing;Dressing Mobility Comments: reports prior to hip fx, ind, household distances while furniture surfing. Family assist for mobility out of the house due to vision deficits and balance. Pt did not use AD. Pt reports significant assist since hip surgery and had just started rehab before discharging home in which she reports she was unable to get OOB ADLs Comments: Pt reports she has been unable to get OOB since discharge from rehab and needing assist for all ADL. Per pt prior to hip fx, Husband assists with shower transfers and occasionally with dressing. Husband cooks/cleans but pt does laundry and dishes     Extremity/Trunk Assessment   Upper Extremity Assessment Upper Extremity Assessment: Defer to OT evaluation RUE Deficits / Details: 2+/5 grossly LUE Deficits / Details: 2+/5 grossly    Lower Extremity Assessment Lower Extremity Assessment: RLE deficits/detail;LLE deficits/detail;Generalized weakness RLE Deficits / Details: 3-/5 or less LLE Deficits / Details: 3-/5 or less    Cervical / Trunk Assessment Cervical / Trunk Assessment: Kyphotic;Other exceptions Cervical / Trunk Exceptions: forward rounding of shoulders, forward flexion of head/neck. Pt reports osteoporosis.  Communication   Communication Communication: No apparent difficulties  Cognition Arousal: Alert Behavior During Therapy: WFL for tasks  assessed/performed Overall Cognitive Status: No family/caregiver present to determine baseline cognitive functioning                                 General Comments: oriented, following commands WFL. Decreased problem solving and needing up to mof vurd futing mobiliyy        General Comments      Exercises     Assessment/Plan    PT Assessment Patient needs continued PT services  PT Problem List Decreased strength;Decreased activity tolerance;Decreased balance;Decreased mobility;Decreased knowledge of use of DME;Decreased safety awareness;Decreased knowledge of precautions;Decreased skin integrity;Cardiopulmonary status limiting activity       PT Treatment Interventions DME instruction;Gait training;Stair training;Functional mobility training;Therapeutic activities;Therapeutic exercise;Balance training;Neuromuscular re-education;Cognitive remediation;Patient/family education    PT Goals (Current goals can be found in the Care Plan section)  Acute Rehab PT Goals Patient Stated Goal: improve strength and mobility PT Goal Formulation: With patient Time For Goal Achievement: 04/09/23 Potential to Achieve Goals: Fair    Frequency Min 1X/week     Co-evaluation               AM-PAC PT "6 Clicks" Mobility  Outcome Measure Help needed turning from your back to your side while in a flat bed without using bedrails?: A Lot Help needed moving from lying on your back to sitting on the side of a flat bed without using bedrails?: A Lot Help needed moving to and from a bed to a chair (including a wheelchair)?: A Lot Help needed  standing up from a chair using your arms (e.g., wheelchair or bedside chair)?: A Lot Help needed to walk in hospital room?: Total Help needed climbing 3-5 steps with a railing? : Total 6 Click Score: 10    End of Session Equipment Utilized During Treatment: Gait belt Activity Tolerance: Patient tolerated treatment well;Patient limited by  fatigue Patient left: in bed;with call bell/phone within reach;with bed alarm set;with nursing/sitter in room Nurse Communication: Mobility status PT Visit Diagnosis: Unsteadiness on feet (R26.81);Muscle weakness (generalized) (M62.81);Difficulty in walking, not elsewhere classified (R26.2)    Time: 1308-6578 PT Time Calculation (min) (ACUTE ONLY): 17 min   Charges:   PT Evaluation $PT Eval Moderate Complexity: 1 Mod   PT General Charges $$ ACUTE PT VISIT: 1 Visit         Wynn Maudlin, DPT Acute Rehabilitation Services Office [phone]  03/26/23 2:05 PM

## 2023-03-26 NOTE — Evaluation (Signed)
Occupational Therapy Evaluation Patient Details Name: Laurie James MRN: [id number] DOB: 03-09-1946 Today's Date: 03/26/2023   History of Present Illness Pt is a 77 y.o. female presenting 12/14 with BLE edema. PMH significant for COPD, paroxysmal a-fib on anticoagulation, CGF with bil pleural effisions, osteoporosis, macular degeneration, back pain, R hip fx s/p IM nailing in October 2024.   Clinical Impression   PTA, pt recently home from SNF rehab and reports she has been unable to get OOB needing significant assist to care for herself. Pt needing mod A+2 for STS transfers and mod-max A +2 for stand pivot transfers and mod cueing for BLE progression. Slow processing throughout but oriented and able to follow commands. Will continue to follow. Patient will benefit from continued inpatient follow up therapy, <3 hours/day       If plan is discharge home, recommend the following: Two people to help with walking and/or transfers;Two people to help with bathing/dressing/bathroom;Assistance with cooking/housework;Direct supervision/assist for medications management;Direct supervision/assist for financial management;Help with stairs or ramp for entrance;Assist for transportation    Functional Status Assessment  Patient has had a recent decline in their functional status and demonstrates the ability to make significant improvements in function in a reasonable and predictable amount of time.  Equipment Recommendations  Other (comment) (defer)    Recommendations for Other Services       Precautions / Restrictions Precautions Precautions: Fall      Mobility Bed Mobility Overal bed mobility: Needs Assistance Bed Mobility: Supine to Sit     Supine to sit: Mod assist     General bed mobility comments: step by step cues and light asisst for all portions    Transfers Overall transfer level: Needs assistance Equipment used: 2 person hand held assist Transfers: Sit to/from  Stand, Bed to chair/wheelchair/BSC Sit to Stand: Mod assist, +2 physical assistance, +2 safety/equipment Stand pivot transfers: Mod assist, +2 physical assistance, +2 safety/equipment         General transfer comment: Mod A +2 for rise. Cues for step sequence      Balance Overall balance assessment: Needs assistance Sitting-balance support: Single extremity supported, Feet supported Sitting balance-Leahy Scale: Poor Sitting balance - Comments: CGA with cues for maintaining   Standing balance support: Bilateral upper extremity supported, During functional activity Standing balance-Leahy Scale: Poor Standing balance comment: reliant on asist                           ADL either performed or assessed with clinical judgement   ADL Overall ADL's : Needs assistance/impaired Eating/Feeding: Set up;Bed level   Grooming: Set up;Sitting   Upper Body Bathing: Moderate assistance;Sitting   Lower Body Bathing: Maximal assistance;+2 for safety/equipment;+2 for physical assistance;Sit to/from stand   Upper Body Dressing : Moderate assistance;Sitting   Lower Body Dressing: Total assistance;Sitting/lateral leans Lower Body Dressing Details (indicate cue type and reason): socks Toilet Transfer: Moderate assistance;+2 for safety/equipment;+2 for physical assistance;Stand-pivot Toilet Transfer Details (indicate cue type and reason): 2 person HHA Toileting- Clothing Manipulation and Hygiene: Total assistance;+2 for physical assistance;+2 for safety/equipment               Vision Baseline Vision/History: 6 Macular Degeneration Ability to See in Adequate Light: 3 Highly impaired Patient Visual Report: No change from baseline Additional Comments: impaired at baseline; pt reports no changes. When asked if wanted windows open, pt reported "I cannot see anything"     Perception  Praxis         Pertinent Vitals/Pain Pain Assessment Pain Assessment: Faces Faces Pain  Scale: Hurts even more Pain Location: RLE, BLE, bottom Pain Descriptors / Indicators: Aching, Discomfort, Grimacing, Guarding Pain Intervention(s): Limited activity within patient's tolerance, Monitored during session     Extremity/Trunk Assessment Upper Extremity Assessment Upper Extremity Assessment: Generalized weakness;Right hand dominant;RUE deficits/detail;LUE deficits/detail RUE Deficits / Details: 2+/5 grossly LUE Deficits / Details: 2+/5 grossly   Lower Extremity Assessment Lower Extremity Assessment: Defer to PT evaluation   Cervical / Trunk Assessment Cervical / Trunk Assessment: Kyphotic;Other exceptions Cervical / Trunk Exceptions: forward rounding of shoulders, forward flexion of head/neck. Pt reports osteoporosis.   Communication Communication Communication: No apparent difficulties   Cognition Arousal: Alert Behavior During Therapy: WFL for tasks assessed/performed Overall Cognitive Status: No family/caregiver present to determine baseline cognitive functioning                                 General Comments: oriented, following commands WFL. Decreased problem solving and needing up to mof vurd futing mobiliyy     General Comments       Exercises     Shoulder Instructions      Home Living Family/patient expects to be discharged to:: Private residence Living Arrangements: Spouse/significant other Available Help at Discharge: Family;Available 24 hours/day (daughter and son live nearby) Type of Home: House Home Access: Stairs to enter;Ramped entrance Entrance Stairs-Number of Steps: 3 Entrance Stairs-Rails: None Home Layout: Two level;Able to live on main level with bedroom/bathroom Alternate Level Stairs-Number of Steps: flight Alternate Level Stairs-Rails: Left Bathroom Shower/Tub: Walk-in Human resources officer: Standard     Home Equipment: Agricultural consultant (2 wheels);Cane - single point;Shower seat;Wheelchair - manual;Grab bars -  tub/shower;Hospital bed;BSC/3in1          Prior Functioning/Environment Prior Level of Function : Needs assist       Physical Assist : Mobility (physical);ADLs (physical) Mobility (physical): Gait;Stairs ADLs (physical): Bathing;Dressing Mobility Comments: reports prior to hip fx, ind, household distances while furniture surfing. Family assist for mobility out of the house due to vision deficits and balance. Pt did not use AD. Pt reports significant assist since hip surgery and had just started rehab before discharging home in which she reports she was unable to get OOB ADLs Comments: Pt reports she has been unable to get OOB since discharge from rehab and needing assist for all ADL. Per pt prior to hip fx, Husband assists with shower transfers and occasionally with dressing. Husband cooks/cleans but pt does laundry and dishes        OT Problem List: Decreased strength;Decreased activity tolerance;Impaired balance (sitting and/or standing);Decreased coordination;Impaired vision/perception;Decreased safety awareness;Decreased knowledge of use of DME or AE;Decreased knowledge of precautions;Impaired UE functional use      OT Treatment/Interventions: Self-care/ADL training;Therapeutic exercise;DME and/or AE instruction;Therapeutic activities;Cognitive remediation/compensation;Patient/family education;Balance training;Visual/perceptual remediation/compensation    OT Goals(Current goals can be found in the care plan section) Acute Rehab OT Goals Patient Stated Goal: get better OT Goal Formulation: With patient Time For Goal Achievement: 04/09/23 Potential to Achieve Goals: Fair  OT Frequency: Min 1X/week    Co-evaluation              AM-PAC OT "6 Clicks" Daily Activity     Outcome Measure Help from another person eating meals?: A Little Help from another person taking care of personal grooming?: A Little Help from another person toileting,  which includes using toliet, bedpan, or  urinal?: A Lot Help from another person bathing (including washing, rinsing, drying)?: A Lot Help from another person to put on and taking off regular upper body clothing?: A Lot Help from another person to put on and taking off regular lower body clothing?: Total 6 Click Score: 13   End of Session Equipment Utilized During Treatment: Gait belt Nurse Communication: Mobility status  Activity Tolerance: Patient tolerated treatment well Patient left: in chair;with call bell/phone within reach;with chair alarm set  OT Visit Diagnosis: Unsteadiness on feet (R26.81);Muscle weakness (generalized) (M62.81);History of falling (Z91.81);Low vision, both eyes (H54.2);Pain Pain - part of body:  (R hip, BLE, bottom)                Time: 2956-2130 OT Time Calculation (min): 34 min Charges:  OT General Charges $OT Visit: 1 Visit OT Evaluation $OT Eval Moderate Complexity: 1 Mod OT Treatments $Self Care/Home Management : 8-22 mins  Tyler Deis, OTR/L [facility] Acute Rehabilitation Office: [phone]   Myrla Halsted 03/26/2023, 11:46 AM

## 2023-03-27 ENCOUNTER — Inpatient Hospital Stay (HOSPITAL_COMMUNITY): Payer: Medicare HMO

## 2023-03-27 ENCOUNTER — Encounter (HOSPITAL_COMMUNITY): Payer: Self-pay | Admitting: Internal Medicine

## 2023-03-27 DIAGNOSIS — E785 Hyperlipidemia, unspecified: Secondary | ICD-10-CM | POA: Diagnosis not present

## 2023-03-27 DIAGNOSIS — S72001D Fracture of unspecified part of neck of right femur, subsequent encounter for closed fracture with routine healing: Secondary | ICD-10-CM

## 2023-03-27 DIAGNOSIS — N1831 Chronic kidney disease, stage 3a: Secondary | ICD-10-CM | POA: Diagnosis not present

## 2023-03-27 DIAGNOSIS — I480 Paroxysmal atrial fibrillation: Secondary | ICD-10-CM | POA: Diagnosis not present

## 2023-03-27 DIAGNOSIS — I5033 Acute on chronic diastolic (congestive) heart failure: Secondary | ICD-10-CM | POA: Diagnosis not present

## 2023-03-27 LAB — BASIC METABOLIC PANEL
Anion gap: 9 (ref 5–15)
BUN: 15 mg/dL (ref 8–23)
CO2: 25 mmol/L (ref 22–32)
Calcium: 7.4 mg/dL — ABNORMAL LOW (ref 8.9–10.3)
Chloride: 97 mmol/L — ABNORMAL LOW (ref 98–111)
Creatinine, Ser: 0.98 mg/dL (ref 0.44–1.00)
GFR, Estimated: 59 mL/min — ABNORMAL LOW (ref >60)
Glucose, Bld: 111 mg/dL — ABNORMAL HIGH (ref 70–99)
Potassium: 4.7 mmol/L (ref 3.5–5.1)
Sodium: 131 mmol/L — ABNORMAL LOW (ref 135–145)

## 2023-03-27 LAB — MAGNESIUM: Magnesium: 2.2 mg/dL (ref 1.7–2.4)

## 2023-03-27 NOTE — Progress Notes (Signed)
PROGRESS NOTE    Laurie James  UJW:[id number] DOB: [birth year] DOA: 03/24/2023 PCP: Georgann Housekeeper, MD  77/F w COPD, P Afib, Diastolic CHF, chronic back pain who presented with bilateral lower extremity edema.  Recent hospitalization for right hip replacement, 10/23 to 02/05/23 transfused PRBC and then discharged to SNF on PO lasix. At SNF> poor mobility, worsening lower extremity edema. Apparently she has been not adherent with her medications.  In ED< VOlume overloaded, CXR wmild cardiomegaly and positive hyperinflation, bilateral hilar vascular congestion with bilateral pleural effusions, more right than left.  - placed furosemide for diuresis.  12/16 patient responding well to diuresis.  12/17 volume status improving but not yet back to baseline. Will need SNF at the time of discharge. Added Unna boots.   Subjective: Feels ok, breathing better  Assessment and Plan:  Acute on chronic diastolic CHF RV Failure, Pulm Hypertension -Echo w/ preserved EF 50%, RV systolic function with mild reduction, RVSP 43.7 mmHg, RA with severe dilatation, moderate to severe tricuspid valve regurgitation, moderate pulmonary regurgitation,  -continue  furosemide to 60 mg IV bid and spironolactone 12.5 mg daily.  -she is 5.5L negative -Cards notes reviewed, has not tolerated multiple attempts at GDMT in the past  Acute hypoxemic respiratory failure due to acute cardiogenic pulmonary edema.  -wean O2, diuresis as above -dopplers neg for DVT  Recent Hip Fracture -s/p Surgery in October -PT/OT  Paroxysmal atrial fibrillation (HCC) Continue amiodarone and apixaban.   Chronic kidney disease, stage 3a (HCC) Hyponatremia. Hypokalemia.  -improved Continue diuresis with furosemide and spironolactone.   HLD (hyperlipidemia) Continue statin therapy.   CAD (coronary artery disease) No chest pain, no acute coronary syndrome.   Hypothyroidism Continue with levothyroxine.    Protein-calorie malnutrition, severe Patient very deconditioned with poor oral intake.  Serum albumin is 1,8  Trial of megestrol for 3 days.  consult nutrition for further recommendations.   DVT prophylaxis: apixaban Code Status: DNR Family Communication: none present Disposition Plan: Home w/ HH vs SNF  Consultants:    Procedures:   Antimicrobials:    Objective: Vitals:   03/27/23 0008 03/27/23 0326 03/27/23 0733 03/27/23 1109  BP: (!) 93/41 (!) 102/52 (!) 114/58 (!) 95/54  Pulse: 62 (!) 59 (!) 51 71  Resp: 20 19 20 20   Temp: (!) 97.5 F (36.4 C) (!) 97.4 F (36.3 C) 97.7 F (36.5 C) 98.2 F (36.8 C)  TempSrc: Oral Oral Oral Oral  SpO2: 93% 93% 96% 95%  Weight:  52 kg    Height:        Intake/Output Summary (Last 24 hours) at 03/27/2023 1553 Last data filed at 03/27/2023 1223 Gross per 24 hour  Intake 380 ml  Output 2250 ml  Net -1870 ml   Filed Weights   03/25/23 0519 03/26/23 0453 03/27/23 0326  Weight: 52.4 kg 53.9 kg 52 kg    Examination:Gen: Awake, Alert, Oriented X 3, elderly frail HEENT: + JVD Lungs: poor air movt CVS: S1S2/RRR Abd: soft, Non tender, non distended, BS present Extremities: 1+edema, UNNA boots Skin: no new rashes on exposed skin     Data Reviewed:   CBC: Recent Labs  Lab 03/24/23 1352  WBC 13.7*  NEUTROABS 12.0*  HGB 11.2*  HCT 34.4*  MCV 89.4  PLT 411*   Basic Metabolic Panel: Recent Labs  Lab 03/24/23 1352 03/25/23 0257 03/26/23 0249 03/27/23 0306  NA 127* 129* 127* 131*  K 4.1 3.7 3.1* 4.7  CL 97* 98 95* 97*  CO2  22 22 24 25   GLUCOSE 99 86 90 111*  BUN 15 13 13 15   CREATININE 0.92 1.11* 1.03* 0.98  CALCIUM 7.8* 7.6* 7.2* 7.4*  MG 1.8  --  1.8 2.2   GFR: Estimated Creatinine Clearance: 32.3 mL/min (by C-G formula based on SCr of 0.98 mg/dL). Liver Function Tests: Recent Labs  Lab 03/24/23 1352  AST 65*  ALT 48*  ALKPHOS 109  BILITOT 1.2*  PROT 6.1*  ALBUMIN 1.8*   No results for input(s):  "LIPASE", "AMYLASE" in the last 168 hours. No results for input(s): "AMMONIA" in the last 168 hours. Coagulation Profile: No results for input(s): "INR", "PROTIME" in the last 168 hours. Cardiac Enzymes: No results for input(s): "CKTOTAL", "CKMB", "CKMBINDEX", "TROPONINI" in the last 168 hours. BNP (last 3 results) No results for input(s): "PROBNP" in the last 8760 hours. HbA1C: No results for input(s): "HGBA1C" in the last 72 hours. CBG: No results for input(s): "GLUCAP" in the last 168 hours. Lipid Profile: No results for input(s): "CHOL", "HDL", "LDLCALC", "TRIG", "CHOLHDL", "LDLDIRECT" in the last 72 hours. Thyroid Function Tests: No results for input(s): "TSH", "T4TOTAL", "FREET4", "T3FREE", "THYROIDAB" in the last 72 hours. Anemia Panel: No results for input(s): "VITAMINB12", "FOLATE", "FERRITIN", "TIBC", "IRON", "RETICCTPCT" in the last 72 hours. Urine analysis:    Component Value Date/Time   COLORURINE YELLOW 05/01/2022 1922   APPEARANCEUR HAZY (A) 05/01/2022 1922   LABSPEC 1.029 05/01/2022 1922   PHURINE 5.0 05/01/2022 1922   GLUCOSEU 50 (A) 05/01/2022 1922   HGBUR SMALL (A) 05/01/2022 1922   BILIRUBINUR NEGATIVE 05/01/2022 1922   KETONESUR NEGATIVE 05/01/2022 1922   PROTEINUR 100 (A) 05/01/2022 1922   NITRITE NEGATIVE 05/01/2022 1922   LEUKOCYTESUR NEGATIVE 05/01/2022 1922   Sepsis Labs: @LABRCNTIP (procalcitonin:4,lacticidven:4)  )No results found for this or any previous visit (from the past 240 hours).   Radiology Studies: VAS [place] LOWER EXTREMITY VENOUS (DVT) Result Date: 03/27/2023  Lower Venous DVT Study Patient Name:  [facility] Laurie James  Date of Exam:   03/27/2023 Medical Rec #: [id number]                    Accession #:    [id number] Date of Birth: 01-31-1946                    Patient Gender: F Patient Age:   49 years Exam Location:  [facility] Procedure:      VAS [place] LOWER EXTREMITY VENOUS (DVT) Referring Phys: Erin Hearing  --------------------------------------------------------------------------------  Indications: Edema. Other Indications: Afib, CHF. Comparison Study: No prior exam. Performing Technologist: Fernande Bras  Examination Guidelines: A complete evaluation includes B-mode imaging, spectral Doppler, color Doppler, and power Doppler as needed of all accessible portions of each vessel. Bilateral testing is considered an integral part of a complete examination. Limited examinations for reoccurring indications may be performed as noted. The reflux portion of the exam is performed with the patient in reverse Trendelenburg.  +---------+---------------+---------+-----------+----------+-------------------+ RIGHT    CompressibilityPhasicitySpontaneityPropertiesThrombus Aging      +---------+---------------+---------+-----------+----------+-------------------+ CFV      Full           Yes      Yes                  Chronic vs intimal  thickening.         +---------+---------------+---------+-----------+----------+-------------------+ SFJ      Full           Yes      Yes                                      +---------+---------------+---------+-----------+----------+-------------------+ FV Prox  Full                                                             +---------+---------------+---------+-----------+----------+-------------------+ FV Mid   Full                                                             +---------+---------------+---------+-----------+----------+-------------------+ FV DistalFull           Yes      Yes                                      +---------+---------------+---------+-----------+----------+-------------------+ PFV      Full                                                             +---------+---------------+---------+-----------+----------+-------------------+ POP      Full            Yes      Yes                                      +---------+---------------+---------+-----------+----------+-------------------+ PTV      Full                                                             +---------+---------------+---------+-----------+----------+-------------------+ PERO     Full                                                             +---------+---------------+---------+-----------+----------+-------------------+ Only superior PTV and Peros visualized, mid and distal segments bandaged. Chronic vs intimal thickening visualized in right CFV.  +---------+---------------+---------+-----------+----------+--------------+ LEFT     CompressibilityPhasicitySpontaneityPropertiesThrombus Aging +---------+---------------+---------+-----------+----------+--------------+ CFV      Full           Yes      Yes                                 +---------+---------------+---------+-----------+----------+--------------+  SFJ      Full           Yes      Yes                                 +---------+---------------+---------+-----------+----------+--------------+ FV Prox  Full                                                        +---------+---------------+---------+-----------+----------+--------------+ FV Mid   Full                                                        +---------+---------------+---------+-----------+----------+--------------+ FV DistalFull                                                        +---------+---------------+---------+-----------+----------+--------------+ PFV      Full                                                        +---------+---------------+---------+-----------+----------+--------------+ POP      Full           Yes      Yes                                 +---------+---------------+---------+-----------+----------+--------------+ PTV      Full                                                         +---------+---------------+---------+-----------+----------+--------------+ PERO     Full                                                        +---------+---------------+---------+-----------+----------+--------------+     Summary: BILATERAL: - No evidence of deep vein thrombosis seen in the lower extremities, bilaterally. -No evidence of popliteal cyst, bilaterally.   *See table(s) above for measurements and observations.    Preliminary    ECHOCARDIOGRAM LIMITED Result Date: 03/26/2023    ECHOCARDIOGRAM LIMITED REPORT   Patient Name:   [facility] Laurie James Date of Exam: 03/26/2023 Medical Rec #:  [id number]                   Height:       56.0 in Accession #:    [id number]  Weight:       118.8 lb Date of Birth:  1945-12-29                   BSA:          1.423 m Patient Age:    77 years                    BP:           94/54 mmHg Patient Gender: F                           HR:           61 bpm. Exam Location:  Inpatient Procedure: 2D Echo, Color Doppler and Limited Color Doppler Indications:    CHF  History:        Patient has prior history of Echocardiogram examinations.  Sonographer:    Darlys Gales Referring Phys: [id number] MAURICIO DANIEL ARRIEN IMPRESSIONS  1. Left ventricular ejection fraction, by estimation, is 50%. The left ventricle has mildly decreased function. The left ventricle demonstrates global hypokinesis.  2. D-shaped interventricular septum suggesting RV pressure/volume overload. Right ventricular systolic function is mildly reduced. The right ventricular size is moderately enlarged. There is mildly elevated pulmonary artery systolic pressure. The estimated right ventricular systolic pressure is 43.7 mmHg.  3. Right atrial size was severely dilated.  4. The mitral valve is normal in structure. No evidence of mitral valve regurgitation. No evidence of mitral stenosis.  5. The tricuspid valve is abnormal. Tricuspid valve regurgitation is moderate to severe.   6. The aortic valve is tricuspid. There is mild calcification of the aortic valve. Aortic valve regurgitation is not visualized. No aortic stenosis is present.  7. Pulmonic valve regurgitation is moderate.  8. The inferior vena cava is normal in size with greater than 50% respiratory variability, suggesting right atrial pressure of 3 mmHg. FINDINGS  Left Ventricle: Left ventricular ejection fraction, by estimation, is 50%. The left ventricle has mildly decreased function. The left ventricle demonstrates global hypokinesis. Right Ventricle: D-shaped interventricular septum suggesting RV pressure/volume overload. The right ventricular size is moderately enlarged. Right ventricular systolic function is mildly reduced. There is mildly elevated pulmonary artery systolic pressure. The tricuspid regurgitant velocity is 3.19 m/s, and with an assumed right atrial pressure of 3 mmHg, the estimated right ventricular systolic pressure is 43.7 mmHg. Left Atrium: Left atrial size was normal in size. Right Atrium: Right atrial size was severely dilated. Mitral Valve: The mitral valve is normal in structure. There is mild calcification of the mitral valve leaflet(s). No evidence of mitral valve stenosis. Tricuspid Valve: The tricuspid valve is abnormal. Tricuspid valve regurgitation is moderate to severe. Aortic Valve: The aortic valve is tricuspid. There is mild calcification of the aortic valve. Aortic valve regurgitation is not visualized. No aortic stenosis is present. Pulmonic Valve: The pulmonic valve was normal in structure. Pulmonic valve regurgitation is moderate. Aorta: The aortic arch was not well visualized. Venous: The inferior vena cava is normal in size with greater than 50% respiratory variability, suggesting right atrial pressure of 3 mmHg. LEFT VENTRICLE PLAX 2D LVOT diam:     1.80 cm LVOT Area:     2.54 cm  LEFT ATRIUM             Index        RIGHT ATRIUM           Index LA Vol (  A2C):   44.4 ml 31.21 ml/m  RA  Area:     27.90 cm LA Vol (A4C):   40.3 ml 28.32 ml/m  RA Volume:   105.00 ml 73.80 ml/m LA Biplane Vol: 42.7 ml 30.01 ml/m   AORTA Ao Root diam: 2.90 cm TRICUSPID VALVE TR Peak grad:   40.7 mmHg TR Vmax:        319.00 cm/s  SHUNTS Systemic Diam: 1.80 cm Dalton McleanMD Electronically signed by Wilfred Lacy Signature Date/Time: 03/26/2023/2:49:52 PM    Final      Scheduled Meds:  amiodarone  200 mg Oral Daily   apixaban  2.5 mg Oral BID   atorvastatin  40 mg Oral Daily   feeding supplement  237 mL Oral BID BM   furosemide  60 mg Intravenous Q12H   levothyroxine  75 mcg Oral Q0600   mometasone-formoterol  2 puff Inhalation BID   multivitamin with minerals  1 tablet Oral Daily   nystatin  5 mL Oral QID   pantoprazole  40 mg Oral BID   Continuous Infusions:  promethazine (PHENERGAN) injection (IM or IVPB) Stopped (03/25/23 1130)     LOS: 2 days    Time spent:     Zannie Cove, MD [facility]   03/27/2023, 3:53 PM

## 2023-03-27 NOTE — TOC CM/SW Note (Addendum)
Please be advised that the above-named patient will require a short-term nursing home stay-anticipated 30 days or less for rehabilitation and strengthening. The plan is for return home.  

## 2023-03-27 NOTE — NC FL2 (Signed)
[place] MEDICAID FL2 LEVEL OF CARE FORM     IDENTIFICATION  Patient Name: Laurie James Birthdate: 03/20/1946 Sex: female Admission Date (Current Location): 03/24/2023  [facility] and [place] Number:  Producer, television/film/video and Address:  [facility], [address] [place], [place] [address]      Provider Number: [id number]  Attending Physician Name and Address:  Coralie Keens  Relative Name and Phone Number:       Current Level of Care: Hospital Recommended Level of Care: Skilled Nursing Facility Prior Approval Number:    Date Approved/Denied:   PASRR Number:    Discharge Plan: SNF    Current Diagnoses: Patient Active Problem List   Diagnosis Date Noted   Diastolic CHF (HCC) 03/24/2023   Malnutrition of moderate degree 02/03/2023   Bradycardia 02/01/2023   Hip fracture (HCC) 01/31/2023   Acute exacerbation of CHF (congestive heart failure) (HCC) 01/30/2023   Hypothyroidism 01/30/2023   Community acquired pneumonia 12/27/2022   Acute CHF (congestive heart failure) (HCC) 10/27/2022   Chronic kidney disease, stage 3a (HCC) 09/08/2022   Heart failure (HCC) 09/06/2022   Chronic diastolic CHF (congestive heart failure) (HCC) 09/05/2022   GERD without esophagitis 05/02/2022   Gout 05/02/2022   Left lower quadrant abdominal pain 05/02/2022   Elevated LFTs 05/02/2022   Hyponatremia 05/02/2022   Recurrent right pleural effusion 05/01/2022   Paroxysmal atrial fibrillation (HCC) 05/01/2022   Secondary hypercoagulable state (HCC) 04/04/2022   Chronic systolic CHF (congestive heart failure) (HCC) 11/28/2021   Macular degeneration 11/28/2021   MCI (mild cognitive impairment) 11/28/2021   OSA (obstructive sleep apnea)    Protein-calorie malnutrition, severe 10/22/2020   Depression    Acute on chronic diastolic CHF (congestive heart failure) (HCC)    Bilateral pleural effusion 10/19/2020   Pulmonary HTN (HCC) 10/11/2020    Tricuspid regurgitation 10/11/2020   HLD (hyperlipidemia) 10/11/2020   Hypotension 10/11/2020   Hypokalemia 10/11/2020   Chest pain at rest 10/05/2020   CAD (coronary artery disease) 04/24/2014   COPD with acute exacerbation (HCC) 04/24/2014    Orientation RESPIRATION BLADDER Height & Weight     Self, Time, Situation, Place  O2 (2L O2 [place]) External catheter, Incontinent Weight: 114 lb 10.2 oz (52 kg) Height:  4\' 8"  (142.2 cm)  BEHAVIORAL SYMPTOMS/MOOD NEUROLOGICAL BOWEL NUTRITION STATUS      Continent Diet (See dc summary)  AMBULATORY STATUS COMMUNICATION OF NEEDS Skin   Extensive Assist Verbally Normal                       Personal Care Assistance Level of Assistance  Bathing, Feeding, Dressing Bathing Assistance: Maximum assistance Feeding assistance: Limited assistance Dressing Assistance: Maximum assistance     Functional Limitations Info  Sight, Hearing, Speech Sight Info: Adequate Hearing Info: Adequate Speech Info: Adequate    SPECIAL CARE FACTORS FREQUENCY  PT (By licensed PT), OT (By licensed OT)     PT Frequency: 5x week OT Frequency: 5x week            Contractures Contractures Info: Not present    Additional Factors Info  Code Status, Allergies Code Status Info: Full Allergies Info: Codeine           Current Medications (03/27/2023):  This is the current hospital active medication list Current Facility-Administered Medications  Medication Dose Route Frequency Provider Last Rate Last Admin   acetaminophen (TYLENOL) tablet 650 mg  650 mg Oral Q6H PRN  Alessandra Bevels, MD       Or   acetaminophen (TYLENOL) suppository 650 mg  650 mg Rectal Q6H PRN Alessandra Bevels, MD       albuterol (PROVENTIL) (2.5 MG/3ML) 0.083% nebulizer solution 2.5 mg  2.5 mg Nebulization Q2H PRN Alessandra Bevels, MD       amiodarone (PACERONE) tablet 200 mg  200 mg Oral Daily Alessandra Bevels, MD   200 mg at 03/27/23 8119   apixaban (ELIQUIS) tablet 2.5 mg  2.5  mg Oral BID Alessandra Bevels, MD   2.5 mg at 03/27/23 1478   atorvastatin (LIPITOR) tablet 40 mg  40 mg Oral Daily Alessandra Bevels, MD   40 mg at 03/27/23 2956   feeding supplement (ENSURE ENLIVE / ENSURE PLUS) liquid 237 mL  237 mL Oral BID BM Arrien, York Ram, MD   237 mL at 03/27/23 0824   furosemide (LASIX) injection 60 mg  60 mg Intravenous Q12H Coralie Keens, MD   60 mg at 03/27/23 2130   levothyroxine (SYNTHROID) tablet 75 mcg  75 mcg Oral Q0600 Alessandra Bevels, MD   75 mcg at 03/27/23 0622   mometasone-formoterol (DULERA) 100-5 MCG/ACT inhaler 2 puff  2 puff Inhalation BID Alessandra Bevels, MD   2 puff at 03/27/23 0830   multivitamin with minerals tablet 1 tablet  1 tablet Oral Daily Arrien, York Ram, MD   1 tablet at 03/27/23 8657   nitroGLYCERIN (NITROSTAT) SL tablet 0.4 mg  0.4 mg Sublingual Q5 Min x 3 PRN Alessandra Bevels, MD       nystatin (MYCOSTATIN) 100000 UNIT/ML suspension 500,000 Units  5 mL Oral QID Alessandra Bevels, MD   500,000 Units at 03/27/23 0822   ondansetron (ZOFRAN) tablet 4 mg  4 mg Oral TID PRN Alessandra Bevels, MD   4 mg at 03/25/23 8469   oxyCODONE (Oxy IR/ROXICODONE) immediate release tablet 5 mg  5 mg Oral Q4H PRN Arrien, York Ram, MD   5 mg at 03/26/23 1433   pantoprazole (PROTONIX) EC tablet 40 mg  40 mg Oral BID Alessandra Bevels, MD   40 mg at 03/27/23 6295   promethazine (PHENERGAN) 12.5 mg in sodium chloride 0.9 % 50 mL IVPB  12.5 mg Intravenous Q6H PRN Arrien, York Ram, MD   Stopped at 03/25/23 1130     Discharge Medications: Please see discharge summary for a list of discharge medications.  Relevant Imaging Results:  Relevant Lab Results:   Additional Information SSN   [id number]  Michaela Corner, LCSWA

## 2023-03-27 NOTE — Progress Notes (Signed)
Orthopedic Tech Progress Note Patient Details:  Laurie James 01/09/1946 [id number] Applied Unna boots per order.  Ortho Devices Type of Ortho Device: Radio broadcast assistant Ortho Device/Splint Location: BLE Ortho Device/Splint Interventions: Ordered, Application, Adjustment   Post Interventions Patient Tolerated: Well Instructions Provided: Adjustment of device, Care of device  Blase Mess 03/27/2023, 6:50 PM

## 2023-03-27 NOTE — TOC Progression Note (Addendum)
Transition of Care [facility]) - Progression Note    Patient Details  Name: Laurie James MRN: [id number] Date of Birth: 12/09/1945  [facility] CM/SW Contact  Michaela Corner, [place] Phone Number: 03/27/2023, 10:03 AM  Clinical Narrative:   CSW spoke with pt about PT recs for SNF. Pt appeared hesitant about going to SNF and inquired about HH. CSW asked pts permission to fax out referrals and bring back bed offers - pt agreed. CSW will notify NCM if pt declines bed offers.   3:49PM: CSW faxed [phone]) clinicals to [facility] for PASRR review.   Expected Discharge Plan: Home w Home Health Services Barriers to Discharge: Continued Medical Work up  Expected Discharge Plan and Services   Discharge Planning Services: CM Consult Post Acute Care Choice: NA Living arrangements for the past 2 months: Single Family Home                 DME Arranged: N/A DME Agency: NA       HH Arranged: NA           Social Determinants of Health (SDOH) Interventions SDOH Screenings   Food Insecurity: No Food Insecurity (03/25/2023)  Housing: Low Risk  (03/25/2023)  Transportation Needs: No Transportation Needs (03/25/2023)  Utilities: Not At Risk (03/25/2023)  Tobacco Use: Medium Risk (03/27/2023)    Readmission Risk Interventions    01/31/2023    3:16 PM 09/07/2022    3:25 PM  Readmission Risk Prevention Plan  Transportation Screening Complete Complete  PCP or Specialist Appt within 5-7 Days  Complete  PCP or Specialist Appt within 3-5 Days Complete   Home Care Screening  Complete  Medication Review (RN CM)  Complete  HRI or Home Care Consult Complete   Palliative Care Screening Not Applicable   Medication Review (RN Care Manager) Complete

## 2023-03-27 NOTE — Plan of Care (Signed)
  Problem: Education: Goal: Knowledge of General Education information will improve Description: Including pain rating scale, medication(s)/side effects and non-pharmacologic comfort measures Outcome: Progressing   Problem: Clinical Measurements: Goal: Ability to maintain clinical measurements within normal limits will improve Outcome: Progressing   Problem: Skin Integrity: Goal: Risk for impaired skin integrity will decrease Outcome: Progressing   

## 2023-03-27 NOTE — Progress Notes (Addendum)
Progress Note   Patient: Laurie James XBJ:[id number] DOB: 1946/03/21 DOA: 03/24/2023     2 DOS: the patient was seen and examined on 03/27/2023   Brief hospital course: Mrs. Dolley was admitted to the hospital with heart failure exacerbation   77 yo female with the past medical history of COPD, paroxysmal atrial fibrillation, heart failure, and chronic back pain who presented with bilateral lower extremity edema.  Recent hospitalization for right hip replacement, 10/23 to 02/05/23 transferred to SNF for short term rehab. She did require PRBC transfusion 2 units and was discharge on her usual regimen of diuretic therapy with furosemide 20 mg tid per week.  Since her discharge from SNF she continue with poor mobility. Noted to have 7 days of worsening lower extremity edema. Apparently she has been not adherent with her medications. On her initial physical examination her blood pressure was 110/65, HR 98, RR 23 and 02 saturation 93%, lungs with decreased breath sounds at bases with no wheezing, heart with S1 and S2 present and regular with no murmurs or gallops, positive bilateral lower extremity edema.   Chest radiograph with right rotation, positive scoliosis, mild cardiomegaly and positive hyperinflation, bilateral hilar vascular congestion with bilateral pleural effusions, more right than left.   EKG 55 bpm, normal axis, qtc 577, sinus rhythm with no significant ST segment, negative T wave V2 to V3.   Patient was placed furosemide for diuresis.   12/16 patient responding well to diuresis.  12/17 volume status improving but not yet back to baseline. Will need SNF at the time of discharge. Added Unna boots.   Assessment and Plan: * Acute on chronic diastolic CHF (congestive heart failure) (HCC) Echocardiogram (limited) with preserved LV systolic function with EF 50%, global hypokinesis, D shaped interventricular septum in systole and diastole, RV systolic function with mild  reduction, RVSP 43.7 mmHg, RA with severe dilatation, moderate to severe tricuspid valve regurgitation, moderate pulmonary regurgitation,   Acute on chronic core pulmonale Pulmonary hypertension  RV failure.   Documented urine output 1,900  ml Systolic blood pressure 100 mmHg range.   Continue with furosemide to 60 mg IV bid and spironolactone 12.5 mg daily.  Limited pharmacologic options due to risk of hypotension.  Pending result of [place] lower extremities rule out DVT.   Acute cardiogenic pulmonary edema.  02 saturation is 95% on 2 L/min per [place].   Paroxysmal atrial fibrillation (HCC) Continue rate control with amiodarone and anticoagulation with apixaban.   Chronic kidney disease, stage 3a (HCC) Hyponatremia. Hypokalemia.   Today renal function with serum cr at 0,98 with K at 4.7 and serum bicarbonate at 25  Na 131 Mg 2.2   Continue diuresis with furosemide and spironolactone.  Follow up renal function and electrolytes in am.   HLD (hyperlipidemia) Continue statin therapy.   CAD (coronary artery disease) No chest pain, no acute coronary syndrome.   Hypothyroidism Continue with levothyroxine.   Protein-calorie malnutrition, severe Patient very deconditioned with poor oral intake.  Serum albumin is 1,8   Trial of megestrol for 3 days.  Plan to consult nutrition for further recommendations.         Subjective: Patient with no chest pain, edema and dyspnea are improving, she continue very weak and deconditioned   Physical Exam: Vitals:   03/27/23 0008 03/27/23 0326 03/27/23 0733 03/27/23 1109  BP: (!) 93/41 (!) 102/52 (!) 114/58 (!) 95/54  Pulse: 62 (!) 59 (!) 51 71  Resp: 20 19 20 20   Temp: Marland Kitchen)  97.5 F (36.4 C) (!) 97.4 F (36.3 C) 97.7 F (36.5 C) 98.2 F (36.8 C)  TempSrc: Oral Oral Oral Oral  SpO2: 93% 93% 96% 95%  Weight:  52 kg    Height:       Neurology awake and alert ENT with mild pallor Cardiovascular with S1 and S2 present and regular with  positive systolic murmur at the right lower sternal border Respiratory with scattered rales with no wheezing or rhonchi Abdomen with no distention  Positive lower extremity edema ++ (pitting)  Data Reviewed:    Family Communication: no family at the bedside   Disposition: Status is: Inpatient Remains inpatient appropriate because: IV diuresis   Planned Discharge Destination: SNF     Author: Coralie Keens, MD 03/27/2023 1:59 PM  For on call review [email or web address].

## 2023-03-27 NOTE — Progress Notes (Signed)
Mobility Specialist Progress Note:   03/27/23 1430  Mobility  Activity Transferred from chair to bed  Level of Assistance Maximum assist, patient does 25-49% (+2)  Assistive Device Other (Comment) (HHA)  Distance Ambulated (ft) 2 ft  Activity Response Tolerated fair  Mobility Referral Yes  Mobility visit 1 Mobility  Mobility Specialist Start Time (ACUTE ONLY) 1430  Mobility Specialist Stop Time (ACUTE ONLY) 1450  Mobility Specialist Time Calculation (min) (ACUTE ONLY) 20 min   Pt eager to return to bed. Required modA+2 to stand and pivot. maxA+2 to get onto bed as new sizewise bed is too high for pt to sit on. Pt c/o generalized weakness, and sensitive skin. Left with all needs met, granddaughter in room  Addison Lank Mobility Specialist Please contact via SecureChat or  Rehab office at [phone]

## 2023-03-28 DIAGNOSIS — I5033 Acute on chronic diastolic (congestive) heart failure: Secondary | ICD-10-CM | POA: Diagnosis not present

## 2023-03-28 LAB — BASIC METABOLIC PANEL
Anion gap: 9 (ref 5–15)
BUN: 16 mg/dL (ref 8–23)
CO2: 26 mmol/L (ref 22–32)
Calcium: 7.3 mg/dL — ABNORMAL LOW (ref 8.9–10.3)
Chloride: 95 mmol/L — ABNORMAL LOW (ref 98–111)
Creatinine, Ser: 1.06 mg/dL — ABNORMAL HIGH (ref 0.44–1.00)
GFR, Estimated: 54 mL/min — ABNORMAL LOW (ref >60)
Glucose, Bld: 101 mg/dL — ABNORMAL HIGH (ref 70–99)
Potassium: 3.7 mmol/L (ref 3.5–5.1)
Sodium: 130 mmol/L — ABNORMAL LOW (ref 135–145)

## 2023-03-28 MED ORDER — POTASSIUM CHLORIDE CRYS ER 20 MEQ PO TBCR
40.0000 meq | EXTENDED_RELEASE_TABLET | Freq: Two times a day (BID) | ORAL | Status: AC
  Administered 2023-03-28 (×2): 40 meq via ORAL
  Filled 2023-03-28 (×2): qty 2

## 2023-03-28 NOTE — Plan of Care (Signed)

## 2023-03-28 NOTE — TOC Progression Note (Addendum)
Transition of Care [facility]) - Progression Note    Patient Details  Name: Laurie James MRN: [id number] Date of Birth: 01/16/1946  Transition of Care [facility]) CM/SW Contact  Michaela Corner, [place] Phone Number: 03/28/2023, 8:18 AM  Clinical Narrative:   Cherlyn Roberts - [phone] E;  Expires 04/26/2023. CSW sent referrals.   CSW met pt at bedside to provide Medicare.gov rated bed offers for SNF (left on bedside table). Pt states she is unsure of what to do. CSW asked pts permission to contact spouse for assistance, pt agreed. CSW called pts spouse, Fayrene Fearing, to discuss PT recs for SNF and Mr. Pelissier states that pt has been to [facility] on [address] and they would go there since its closer. CSW informed Fayrene Fearing that [facility] has declined at this time due to no bed availability.    CSW asked Fayrene Fearing how he would like to go over the accepting facilities, Fayrene Fearing stated to call his dtr, Tresa Endo, to get SNF list emailed to them. CSW attempted to contact [facility] via phone listed in chart and received the following message "the mailbox is full and cannot accept any messages at this time, goodbye."    TOC will continue to follow.            Skilled Nursing Rehab Facilities-   [email or web address]     Ratings out of 5 stars (5 the highest)    Name Address  Phone # Quality Care Staffing Health Inspection Overall  [facility] [address] [place] [phone] 3 1 4 3   [facility]  [address], [place] [phone] 4 5 5   [facility] [address], [place] [phone] 3 2 2 2   [facility] [address], [place] [phone] 5 1 2 2   [facility] [address] [place], [place] [phone] 2 2 1 1   [facility] [address], [place] [phone] 2 2 3 3   [facility] [address], [place] [phone] 4 2 1 1        Expected Discharge Plan:  (SNF vs [facility]) Barriers to Discharge: Continued Medical  Work up, Awaiting State Approval Financial controller)  Expected Discharge Plan and Services   Discharge Planning Services: CM Consult Post Acute Care Choice: NA Living arrangements for the past 2 months: Single Family Home                 DME Arranged: N/A DME Agency: NA       HH Arranged: NA           Social Determinants of Health (SDOH) Interventions SDOH Screenings   Food Insecurity: No Food Insecurity (03/25/2023)  Housing: Low Risk  (03/25/2023)  Transportation Needs: No Transportation Needs (03/25/2023)  Utilities: Not At Risk (03/25/2023)  Tobacco Use: Medium Risk (03/27/2023)    Readmission Risk Interventions    01/31/2023    3:16 PM 09/07/2022    3:25 PM  Readmission Risk Prevention Plan  Transportation Screening Complete Complete  PCP or Specialist Appt within 5-7 Days  Complete  PCP or Specialist Appt within 3-5 Days Complete   Home Care Screening  Complete  Medication Review (RN CM)  Complete  HRI or Home Care Consult Complete   Palliative Care Screening Not Applicable   Medication Review (RN Care Manager) Complete

## 2023-03-28 NOTE — Plan of Care (Signed)
  Problem: Education: Goal: Knowledge of General Education information will improve Description: Including pain rating scale, medication(s)/side effects and non-pharmacologic comfort measures Outcome: Progressing   Problem: Clinical Measurements: Goal: Ability to maintain clinical measurements within normal limits will improve Outcome: Progressing Goal: Will remain free from infection Outcome: Progressing   

## 2023-03-28 NOTE — Progress Notes (Signed)
Appt canceled!

## 2023-03-28 NOTE — Care Management Important Message (Signed)
Important Message  Patient Details  Name: Laurie James MRN: [id number] Date of Birth: [birth year]   Important Message Given:  Yes - Medicare IM     Renie Ora 03/28/2023, 8:02 AM

## 2023-03-28 NOTE — NC FL2 (Signed)
MEDICAID FL2 LEVEL OF CARE FORM     IDENTIFICATION  Patient Name: Laurie James Birthdate: November 08, 1945 Sex: female Admission Date (Current Location): 03/24/2023  [facility] and [place] Number:  Producer, television/film/video and Address:  [facility] [place], [address] [place], [place] [address]      Provider Number: [id number]  Attending Physician Name and Address:  Zannie Cove, MD  Relative Name and Phone Number:       Current Level of Care: Hospital Recommended Level of Care: Skilled Nursing Facility Prior Approval Number:    Date Approved/Denied:   PASRR Number: [id number]  Discharge Plan: SNF    Current Diagnoses: Patient Active Problem List   Diagnosis Date Noted   Diastolic CHF (HCC) 03/24/2023   Malnutrition of moderate degree 02/03/2023   Bradycardia 02/01/2023   Hip fracture (HCC) 01/31/2023   Acute exacerbation of CHF (congestive heart failure) (HCC) 01/30/2023   Hypothyroidism 01/30/2023   Community acquired pneumonia 12/27/2022   Acute CHF (congestive heart failure) (HCC) 10/27/2022   Chronic kidney disease, stage 3a (HCC) 09/08/2022   Heart failure (HCC) 09/06/2022   Chronic diastolic CHF (congestive heart failure) (HCC) 09/05/2022   GERD without esophagitis 05/02/2022   Gout 05/02/2022   Left lower quadrant abdominal pain 05/02/2022   Elevated LFTs 05/02/2022   Hyponatremia 05/02/2022   Recurrent right pleural effusion 05/01/2022   Paroxysmal atrial fibrillation (HCC) 05/01/2022   Secondary hypercoagulable state (HCC) 04/04/2022   Chronic systolic CHF (congestive heart failure) (HCC) 11/28/2021   Macular degeneration 11/28/2021   MCI (mild cognitive impairment) 11/28/2021   OSA (obstructive sleep apnea)    Protein-calorie malnutrition, severe 10/22/2020   Depression    Acute on chronic diastolic CHF (congestive heart failure) (HCC)    Bilateral pleural effusion 10/19/2020   Pulmonary HTN (HCC) 10/11/2020    Tricuspid regurgitation 10/11/2020   HLD (hyperlipidemia) 10/11/2020   Hypotension 10/11/2020   Hypokalemia 10/11/2020   Chest pain at rest 10/05/2020   CAD (coronary artery disease) 04/24/2014   COPD with acute exacerbation (HCC) 04/24/2014    Orientation RESPIRATION BLADDER Height & Weight     Self, Time, Situation, Place  O2 (2L O2 [place]) External catheter, Incontinent Weight: 108 lb 0.4 oz (49 kg) Height:  4\' 8"  (142.2 cm)  BEHAVIORAL SYMPTOMS/MOOD NEUROLOGICAL BOWEL NUTRITION STATUS      Continent Diet (See dc summary)  AMBULATORY STATUS COMMUNICATION OF NEEDS Skin   Extensive Assist Verbally Normal                       Personal Care Assistance Level of Assistance  Bathing, Feeding, Dressing Bathing Assistance: Maximum assistance Feeding assistance: Limited assistance Dressing Assistance: Maximum assistance     Functional Limitations Info  Sight, Hearing, Speech Sight Info: Adequate Hearing Info: Adequate Speech Info: Adequate    SPECIAL CARE FACTORS FREQUENCY  PT (By licensed PT), OT (By licensed OT)     PT Frequency: 5x week OT Frequency: 5x week            Contractures Contractures Info: Not present    Additional Factors Info  Code Status, Allergies Code Status Info: Full Allergies Info: Codeine           Current Medications (03/28/2023):  This is the current hospital active medication list Current Facility-Administered Medications  Medication Dose Route Frequency Provider Last Rate Last Admin   acetaminophen (TYLENOL) tablet 650 mg  650 mg Oral Q6H PRN Kamineni,  Neelima, MD       Or   acetaminophen (TYLENOL) suppository 650 mg  650 mg Rectal Q6H PRN Alessandra Bevels, MD       albuterol (PROVENTIL) (2.5 MG/3ML) 0.083% nebulizer solution 2.5 mg  2.5 mg Nebulization Q2H PRN Alessandra Bevels, MD       amiodarone (PACERONE) tablet 200 mg  200 mg Oral Daily Alessandra Bevels, MD   200 mg at 03/27/23 7253   apixaban (ELIQUIS) tablet 2.5 mg  2.5  mg Oral BID Alessandra Bevels, MD   2.5 mg at 03/27/23 2224   atorvastatin (LIPITOR) tablet 40 mg  40 mg Oral Daily Alessandra Bevels, MD   40 mg at 03/27/23 6644   feeding supplement (ENSURE ENLIVE / ENSURE PLUS) liquid 237 mL  237 mL Oral BID BM Arrien, York Ram, MD   237 mL at 03/27/23 1540   furosemide (LASIX) injection 60 mg  60 mg Intravenous Q12H Coralie Keens, MD   60 mg at 03/28/23 0347   levothyroxine (SYNTHROID) tablet 75 mcg  75 mcg Oral Q0600 Alessandra Bevels, MD   75 mcg at 03/28/23 4259   mometasone-formoterol (DULERA) 100-5 MCG/ACT inhaler 2 puff  2 puff Inhalation BID Alessandra Bevels, MD   2 puff at 03/28/23 0754   multivitamin with minerals tablet 1 tablet  1 tablet Oral Daily Arrien, York Ram, MD   1 tablet at 03/27/23 5638   nitroGLYCERIN (NITROSTAT) SL tablet 0.4 mg  0.4 mg Sublingual Q5 Min x 3 PRN Alessandra Bevels, MD       nystatin (MYCOSTATIN) 100000 UNIT/ML suspension 500,000 Units  5 mL Oral QID Alessandra Bevels, MD   500,000 Units at 03/27/23 2225   ondansetron (ZOFRAN) tablet 4 mg  4 mg Oral TID PRN Alessandra Bevels, MD   4 mg at 03/25/23 7564   oxyCODONE (Oxy IR/ROXICODONE) immediate release tablet 5 mg  5 mg Oral Q4H PRN Arrien, York Ram, MD   5 mg at 03/26/23 1433   pantoprazole (PROTONIX) EC tablet 40 mg  40 mg Oral BID Alessandra Bevels, MD   40 mg at 03/27/23 2224   promethazine (PHENERGAN) 12.5 mg in sodium chloride 0.9 % 50 mL IVPB  12.5 mg Intravenous Q6H PRN Arrien, York Ram, MD   Stopped at 03/25/23 1130     Discharge Medications: Please see discharge summary for a list of discharge medications.  Relevant Imaging Results:  Relevant Lab Results:   Additional Information SSN   [id number]  Michaela Corner, LCSWA

## 2023-03-28 NOTE — TOC Progression Note (Addendum)
Transition of Care [facility]) - Progression Note    Patient Details  Name: Laurie James MRN: [id number] Date of Birth: 1945/06/01  [facility]) CM/SW Contact  Michaela Corner, [place] Phone Number: 03/28/2023, 12:28 PM  Clinical Narrative:   Family has decided to take pt home and do HH at this time. CSW notified NCM.    Expected Discharge Plan:  (SNF vs HH) Barriers to Discharge: Continued Medical Work up, SNF Pending bed offer  Expected Discharge Plan and Services   Discharge Planning Services: CM Consult Post Acute Care Choice: NA Living arrangements for the past 2 months: Single Family Home                 DME Arranged: N/A DME Agency: NA       HH Arranged: NA           Social Determinants of Health (SDOH) Interventions SDOH Screenings   Food Insecurity: No Food Insecurity (03/25/2023)  Housing: Low Risk  (03/25/2023)  Transportation Needs: No Transportation Needs (03/25/2023)  Utilities: Not At Risk (03/25/2023)  Tobacco Use: Medium Risk (03/27/2023)    Readmission Risk Interventions    01/31/2023    3:16 PM 09/07/2022    3:25 PM  Readmission Risk Prevention Plan  Transportation Screening Complete Complete  PCP or Specialist Appt within 5-7 Days  Complete  PCP or Specialist Appt within 3-5 Days Complete   Home Care Screening  Complete  Medication Review (RN CM)  Complete  HRI or Home Care Consult Complete   Palliative Care Screening Not Applicable   Medication Review (RN Care Manager) Complete

## 2023-03-28 NOTE — TOC Progression Note (Addendum)
Transition of Care [facility]) - Progression Note    Patient Details  Name: Laurie James MRN: [id number] Date of Birth: [birth year]  Transition of Care [facility]) CM/SW Contact  Leone Haven, RN Phone Number: 03/28/2023, 2:30 PM  Clinical Narrative:    NCM notified by CSW that patient and husband has decided for her to go home with [facility].  She is already active with [facility] and would like to continue with them.  Will need HHRN, HHPT , HHOT, HHaide, SW orders.  She will need ambulance transport home at dc.   Expected Discharge Plan:  (SNF vs HH) Barriers to Discharge: Continued Medical Work up, SNF Pending bed offer  Expected Discharge Plan and Services   Discharge Planning Services: CM Consult Post Acute Care Choice: NA Living arrangements for the past 2 months: Single Family Home                 DME Arranged: N/A DME Agency: NA       HH Arranged: NA           Social Determinants of Health (SDOH) Interventions SDOH Screenings   Food Insecurity: No Food Insecurity (03/25/2023)  Housing: Low Risk  (03/25/2023)  Transportation Needs: No Transportation Needs (03/25/2023)  Utilities: Not At Risk (03/25/2023)  Tobacco Use: Medium Risk (03/27/2023)    Readmission Risk Interventions    01/31/2023    3:16 PM 09/07/2022    3:25 PM  Readmission Risk Prevention Plan  Transportation Screening Complete Complete  PCP or Specialist Appt within 5-7 Days  Complete  PCP or Specialist Appt within 3-5 Days Complete   Home Care Screening  Complete  Medication Review (RN CM)  Complete  HRI or Home Care Consult Complete   Palliative Care Screening Not Applicable   Medication Review (RN Care Manager) Complete

## 2023-03-28 NOTE — Progress Notes (Signed)
Physical Therapy Treatment Patient Details Name: Zelina Paoletti MRN: [id number] DOB: 05-07-1945 Today's Date: 03/28/2023   History of Present Illness Pt is a 77 y.o. female presenting 12/14 with BLE edema. PMH significant for COPD, paroxysmal a-fib on anticoagulation, CGF with bil pleural effisions, osteoporosis, macular degeneration, back pain, R hip fx s/p IM nailing in October 2024.    PT Comments  Patient resting in bed and initially declined mobilizing but agreeable with encouragement and discussion of need to progress/maintain some mobility with goal to return home. Patient required Max +2 to pivot supine>sit EOB with cues for use of bed rail to stabilize balance, max assist required to prevent posterior lean. Pt unable to tolerate sitting and requested return to supine, Max+2 to reposition supine and boost in bed. Pt adjusted to chair position for attempt to consume lunch. Pt requesting assist to eat and encouraged to attempt bringing spoon to mouth; pt stating "I can't", "I don't know where my mouth is". With cues pt able to touch mouth with hand and mod assist provided to use spoon to eat mashed potatoes. Pt fatigued after 3 spoonfuls and max assist required for additional food consumption. Apple cobbler required slicing/mashing into soft spoonfuls as pt has no teeth/dentures in place. RN/NT arrived to complete assisting pt with meal. Continue to recommend higher level of assist and care for follow up with <3 hours rehab/day and 24/7 assist. Will progress as able.    If plan is discharge home, recommend the following: Two people to help with walking and/or transfers;Two people to help with bathing/dressing/bathroom;Assistance with feeding;Assistance with cooking/housework;Direct supervision/assist for medications management;Direct supervision/assist for financial management;Assist for transportation;Help with stairs or ramp for entrance   Can travel by private vehicle     No   Equipment Recommendations  Hospital bed;Hoyer lift;Wheelchair cushion (measurements PT);Wheelchair (measurements PT) (air mattress overlay)    Recommendations for Other Services       Precautions / Restrictions Precautions Precautions: Fall Precaution Comments: fragile sensitive skin Restrictions Weight Bearing Restrictions Per Provider Order: No     Mobility  Bed Mobility Overal bed mobility: Needs Assistance Bed Mobility: Supine to Sit, Sit to Supine     Supine to sit: Max assist, Used rails, HOB elevated Sit to supine: Max assist, +2 for safety/equipment, HOB elevated, Used rails   General bed mobility comments: Max +2 with cues to guide LE's. bed pad utilized to pivot hips and cues for pt to use bed rail to support. pt required max support posteriorly to prevent LOB while sitting EOB. Pt c/o feeling sick and tired, and requested reutrn to supine.    Transfers                        Ambulation/Gait                   Stairs             Wheelchair Mobility     Tilt Bed    Modified Rankin (Stroke Patients Only)       Balance Overall balance assessment: Needs assistance Sitting-balance support: Single extremity supported, Feet supported Sitting balance-Leahy Scale: Poor Sitting balance - Comments: CGA with cues for maintaining   Standing balance support: Bilateral upper extremity supported, During functional activity Standing balance-Leahy Scale: Poor Standing balance comment: reliant on asist  Cognition Arousal: Alert Behavior During Therapy: WFL for tasks assessed/performed Overall Cognitive Status: No family/caregiver present to determine baseline cognitive functioning                                          Exercises      General Comments        Pertinent Vitals/Pain Pain Assessment Pain Assessment: Faces Faces Pain Scale: Hurts even more Pain Location: RLE,  BLE, bottom Pain Descriptors / Indicators: Aching, Discomfort, Grimacing, Guarding Pain Intervention(s): Limited activity within patient's tolerance, Monitored during session, Repositioned    Home Living                          Prior Function            PT Goals (current goals can now be found in the care plan section) Acute Rehab PT Goals Patient Stated Goal: improve strength and mobility PT Goal Formulation: With patient Time For Goal Achievement: 04/09/23 Potential to Achieve Goals: Fair Progress towards PT goals: Progressing toward goals    Frequency    Min 1X/week      PT Plan      Co-evaluation              AM-PAC PT "6 Clicks" Mobility   Outcome Measure  Help needed turning from your back to your side while in a flat bed without using bedrails?: Total Help needed moving from lying on your back to sitting on the side of a flat bed without using bedrails?: Total Help needed moving to and from a bed to a chair (including a wheelchair)?: Total Help needed standing up from a chair using your arms (e.g., wheelchair or bedside chair)?: Total Help needed to walk in hospital room?: Total Help needed climbing 3-5 steps with a railing? : Total 6 Click Score: 6    End of Session Equipment Utilized During Treatment: Gait belt Activity Tolerance: Patient tolerated treatment well;Patient limited by fatigue Patient left: in bed;with call bell/phone within reach;with bed alarm set;with nursing/sitter in room Nurse Communication: Mobility status PT Visit Diagnosis: Unsteadiness on feet (R26.81);Muscle weakness (generalized) (M62.81);Difficulty in walking, not elsewhere classified (R26.2)     Time: 7829-5621 PT Time Calculation (min) (ACUTE ONLY): 21 min  Charges:    $Therapeutic Activity: 8-22 mins PT General Charges $$ ACUTE PT VISIT: 1 Visit                     Wynn Maudlin, DPT Acute Rehabilitation Services Office [phone]  03/28/23 5:04  PM

## 2023-03-29 ENCOUNTER — Inpatient Hospital Stay (HOSPITAL_COMMUNITY)
Admission: RE | Admit: 2023-03-29 | Discharge: 2023-03-29 | Disposition: A | Discharge status: Home / Self Care | Payer: Medicare PPO | Source: Ambulatory Visit

## 2023-03-29 DIAGNOSIS — I5033 Acute on chronic diastolic (congestive) heart failure: Secondary | ICD-10-CM | POA: Diagnosis not present

## 2023-03-29 LAB — BASIC METABOLIC PANEL
Anion gap: 8 (ref 5–15)
BUN: 18 mg/dL (ref 8–23)
CO2: 27 mmol/L (ref 22–32)
Calcium: 7.3 mg/dL — ABNORMAL LOW (ref 8.9–10.3)
Chloride: 95 mmol/L — ABNORMAL LOW (ref 98–111)
Creatinine, Ser: 1.07 mg/dL — ABNORMAL HIGH (ref 0.44–1.00)
GFR, Estimated: 53 mL/min — ABNORMAL LOW (ref >60)
Glucose, Bld: 89 mg/dL (ref 70–99)
Potassium: 4.8 mmol/L (ref 3.5–5.1)
Sodium: 130 mmol/L — ABNORMAL LOW (ref 135–145)

## 2023-03-29 LAB — CBC
HCT: 32.4 % — ABNORMAL LOW (ref 36.0–46.0)
Hemoglobin: 10.8 g/dL — ABNORMAL LOW (ref 12.0–15.0)
MCH: 28.8 pg (ref 26.0–34.0)
MCHC: 33.3 g/dL (ref 30.0–36.0)
MCV: 86.4 fL (ref 80.0–100.0)
Platelets: 364 10*3/uL (ref 150–400)
RBC: 3.75 MIL/uL — ABNORMAL LOW (ref 3.87–5.11)
RDW: 20.6 % — ABNORMAL HIGH (ref 11.5–15.5)
WBC: 11.8 10*3/uL — ABNORMAL HIGH (ref 4.0–10.5)
nRBC: 0 % (ref 0.0–0.2)

## 2023-03-29 MED ORDER — TORSEMIDE 20 MG PO TABS
20.0000 mg | ORAL_TABLET | Freq: Every day | ORAL | Status: DC
  Administered 2023-03-29 – 2023-04-02 (×5): 20 mg via ORAL
  Filled 2023-03-29 (×5): qty 1

## 2023-03-29 MED ORDER — POTASSIUM CHLORIDE CRYS ER 20 MEQ PO TBCR
40.0000 meq | EXTENDED_RELEASE_TABLET | Freq: Once | ORAL | Status: AC
  Administered 2023-03-29: 40 meq via ORAL
  Filled 2023-03-29: qty 2

## 2023-03-29 NOTE — Progress Notes (Signed)
PROGRESS NOTE    Laurie James  YNW:[id number] DOB: 1945-10-04 DOA: 03/24/2023 PCP: Georgann Housekeeper, MD  77/F w COPD, P Afib, Diastolic CHF, chronic back pain who presented with bilateral lower extremity edema.  Recent hospitalization for right hip replacement, 10/23 to 02/05/23 transfused PRBC and then discharged to SNF on PO lasix. At SNF> poor mobility, worsening lower extremity edema. Apparently she has been not adherent with her medications.  In ED< VOlume overloaded, CXR wmild cardiomegaly and positive hyperinflation, bilateral hilar vascular congestion with bilateral pleural effusions, more right than left.  - placed furosemide for diuresis.  12/16 patient responding well to diuresis.   Subjective: Patient seen, resting comfortably in bed, daughter at bedside, no complaints this morning  Assessment and Plan:  Acute on chronic diastolic CHF RV Failure, Pulm Hypertension -Echo w/ preserved EF 50%, RV systolic function with mild reduction, RVSP 43.7 mmHg, RA with severe dilatation, moderate to severe tricuspid valve regurgitation, moderate pulmonary regurgitation,  -Volume status improving, she is 6.3 L negative, changed to oral torsemide , restart Aldactone tomorrow if K is stable -Cards notes reviewed, has not tolerated multiple attempts at GDMT in the past -Discharge planning, home with home health services tomorrow  Acute hypoxemic respiratory failure due to acute cardiogenic pulmonary edema.  -wean O2, diuresis as above -dopplers neg for DVT  Recent Hip Fracture -s/p Surgery in October -PT/OT  Paroxysmal atrial fibrillation (HCC) Continue amiodarone and apixaban.   Chronic kidney disease, stage 3a (HCC) Hyponatremia. Hypokalemia.  -improved Continue diuresis with furosemide and spironolactone.   HLD (hyperlipidemia) Continue statin therapy.   CAD (coronary artery disease) No chest pain, no acute coronary syndrome.   Hypothyroidism Continue with  levothyroxine.   Protein-calorie malnutrition, severe Patient very deconditioned with poor oral intake.  Serum albumin is 1,8  Trial of megestrol for 3 days.  consult nutrition for further recommendations.   DVT prophylaxis: apixaban Code Status: DNR Family Communication: Daughter at bedside Disposition Plan: Home with health services tomorrow  Consultants:    Procedures:   Antimicrobials:    Objective: Vitals:   03/29/23 0505 03/29/23 0730 03/29/23 0856 03/29/23 1115  BP: 98/62 (!) 149/137  (!) 90/57  Pulse: 84 87  81  Resp: 18 17  17   Temp: 97.8 F (36.6 C) 97.9 F (36.6 C)  97.7 F (36.5 C)  TempSrc: Oral Oral  Oral  SpO2: 95% 97% 97% 97%  Weight: 45 kg     Height:        Intake/Output Summary (Last 24 hours) at 03/29/2023 1217 Last data filed at 03/29/2023 0031 Gross per 24 hour  Intake 60 ml  Output 1300 ml  Net -1240 ml   Filed Weights   03/27/23 0326 03/28/23 0312 03/29/23 0505  Weight: 52 kg 49 kg 45 kg    Examination:Gen: Awake, Alert, Oriented X 3, elderly frail HEENT: no JVD Lungs: Improving air meant, decreased breath sounds at the bases CVS: S1S2/RRR Abd: soft, Non tender, non distended, BS present Extremities: Trace, UNNA boots Skin: no new rashes on exposed skin     Data Reviewed:   CBC: Recent Labs  Lab 03/24/23 1352 03/29/23 0244  WBC 13.7* 11.8*  NEUTROABS 12.0*  --   HGB 11.2* 10.8*  HCT 34.4* 32.4*  MCV 89.4 86.4  PLT 411* 364   Basic Metabolic Panel: Recent Labs  Lab 03/24/23 1352 03/25/23 0257 03/26/23 0249 03/27/23 0306 03/28/23 0307 03/29/23 0244  NA 127* 129* 127* 131* 130* 130*  K 4.1  3.7 3.1* 4.7 3.7 4.8  CL 97* 98 95* 97* 95* 95*  CO2 22 22 24 25 26 27   GLUCOSE 99 86 90 111* 101* 89  BUN 15 13 13 15 16 18   CREATININE 0.92 1.11* 1.03* 0.98 1.06* 1.07*  CALCIUM 7.8* 7.6* 7.2* 7.4* 7.3* 7.3*  MG 1.8  --  1.8 2.2  --   --    GFR: Estimated Creatinine Clearance: 27.7 mL/min (A) (by C-G formula based on  SCr of 1.07 mg/dL (H)). Liver Function Tests: Recent Labs  Lab 03/24/23 1352  AST 65*  ALT 48*  ALKPHOS 109  BILITOT 1.2*  PROT 6.1*  ALBUMIN 1.8*   No results for input(s): "LIPASE", "AMYLASE" in the last 168 hours. No results for input(s): "AMMONIA" in the last 168 hours. Coagulation Profile: No results for input(s): "INR", "PROTIME" in the last 168 hours. Cardiac Enzymes: No results for input(s): "CKTOTAL", "CKMB", "CKMBINDEX", "TROPONINI" in the last 168 hours. BNP (last 3 results) No results for input(s): "PROBNP" in the last 8760 hours. HbA1C: No results for input(s): "HGBA1C" in the last 72 hours. CBG: No results for input(s): "GLUCAP" in the last 168 hours. Lipid Profile: No results for input(s): "CHOL", "HDL", "LDLCALC", "TRIG", "CHOLHDL", "LDLDIRECT" in the last 72 hours. Thyroid Function Tests: No results for input(s): "TSH", "T4TOTAL", "FREET4", "T3FREE", "THYROIDAB" in the last 72 hours. Anemia Panel: No results for input(s): "VITAMINB12", "FOLATE", "FERRITIN", "TIBC", "IRON", "RETICCTPCT" in the last 72 hours. Urine analysis:    Component Value Date/Time   COLORURINE YELLOW 05/01/2022 1922   APPEARANCEUR HAZY (A) 05/01/2022 1922   LABSPEC 1.029 05/01/2022 1922   PHURINE 5.0 05/01/2022 1922   GLUCOSEU 50 (A) 05/01/2022 1922   HGBUR SMALL (A) 05/01/2022 1922   BILIRUBINUR NEGATIVE 05/01/2022 1922   KETONESUR NEGATIVE 05/01/2022 1922   PROTEINUR 100 (A) 05/01/2022 1922   NITRITE NEGATIVE 05/01/2022 1922   LEUKOCYTESUR NEGATIVE 05/01/2022 1922   Sepsis Labs: @LABRCNTIP (procalcitonin:4,lacticidven:4)  )No results found for this or any previous visit (from the past 240 hours).   Radiology Studies: No results found.    Scheduled Meds:  amiodarone  200 mg Oral Daily   apixaban  2.5 mg Oral BID   atorvastatin  40 mg Oral Daily   feeding supplement  237 mL Oral BID BM   levothyroxine  75 mcg Oral Q0600   mometasone-formoterol  2 puff Inhalation BID    multivitamin with minerals  1 tablet Oral Daily   nystatin  5 mL Oral QID   pantoprazole  40 mg Oral BID   torsemide  20 mg Oral Daily   Continuous Infusions:  promethazine (PHENERGAN) injection (IM or IVPB) Stopped (03/25/23 1130)     LOS: 4 days    Time spent:     Zannie Cove, MD [facility]   03/29/2023, 12:17 PM

## 2023-03-29 NOTE — Consult Note (Signed)
[facility] Liaison Note  03/29/2023  Laurie James [place] 04-15-1945 [id number]  Covering Laurie James [facility] hospital liaison)  Location: [facility] Liaison screened the patient remotely at [facility].  Insurance: [facility] HMO   Laurie James Laurie James is a 77 y.o. female who is a Primary Care Patient of Laurie Housekeeper, [facility].  The patient was screened for readmission hospitalization with noted high risk score for unplanned readmission risk with 5 IP in 6 months.  The patient was assessed for potential Care Management service needs for post hospital transition for care coordination. Review of patient's electronic medical record reveals patient CHF. Pt is managed by the [facility] care management ream. Liaison will collaborate the [facility] team concerning upon pt's discharged disposition.  Plan: Pending disposition.  [facility] Care Management/Population Health does not replace or interfere with any arrangements made by the Inpatient Transition of Care team.   For questions contact:   Elliot Cousin, RN, [facility] Liaison [place]   [facility], Population Health Office Hours MTWF  8:00 am-6:00 pm Direct Dial: [phone] mobile [phone] [Office toll free line] Office Hours are M-F 8:30 - 5 pm [email or web address]

## 2023-03-29 NOTE — Plan of Care (Signed)
  Problem: Safety: Goal: Ability to remain free from injury will improve Outcome: Progressing   Problem: Activity: Goal: Risk for activity intolerance will decrease Outcome: Not Progressing   Problem: Nutrition: Goal: Adequate nutrition will be maintained Outcome: Not Progressing   Problem: Coping: Goal: Level of anxiety will decrease Outcome: Not Progressing   Problem: Skin Integrity: Goal: Risk for impaired skin integrity will decrease Outcome: Not Progressing

## 2023-03-29 NOTE — Plan of Care (Signed)
  Problem: Education: Goal: Knowledge of General Education information will improve Description: Including pain rating scale, medication(s)/side effects and non-pharmacologic comfort measures Outcome: Progressing   Problem: Clinical Measurements: Goal: Respiratory complications will improve Outcome: Progressing   

## 2023-03-29 NOTE — Progress Notes (Signed)
Patient complained of discomfort on both lower extremities due to coban dressing and prevalon boots. Assessed the dressing clean, intact and no signs of bleeding. Removed  boots and coban dressing as per patient request, educated patient and significant other about the importance of heel protection and coban dressing to reduced pressure injury and impaired skin integrity. Informed Charge Nurse Molli Hazard about the situation.

## 2023-03-29 NOTE — Progress Notes (Signed)
Occupational Therapy Treatment Patient Details Name: Laurie James MRN: [id number] DOB: 07-26-1945 Today's Date: 03/29/2023   History of present illness Pt is a 77 y.o. female presenting 12/14 with BLE edema. PMH significant for COPD, paroxysmal a-fib on anticoagulation, CGF with bil pleural effisions, osteoporosis, macular degeneration, back pain, R hip fx s/p IM nailing in October 2024.   OT comments  Pt with slower progress towards OT goals due to strength/endurance deficits and impaired intrinsic motivation. Pt able to assist with basic UB ADLs bed level with Min A and engaged in therapeutic exercises though fatigued very quickly. Pt declined EOB/OOB attempts during session, as well as lunch assistance which was delivered during session. Noted family preference to take pt home with [facility] services; recommend consistent physical assistance w/ bed level ADLs at DC if discharging home remains the plan.       If plan is discharge home, recommend the following:  Two people to help with walking and/or transfers;Two people to help with bathing/dressing/bathroom;Assistance with cooking/housework;Direct supervision/assist for medications management;Direct supervision/assist for financial management;Help with stairs or ramp for entrance;Assist for transportation   Equipment Recommendations  Wheelchair (measurements OT);Wheelchair cushion (measurements OT) (if does not already have)    Recommendations for Other Services      Precautions / Restrictions Precautions Precautions: Fall Precaution Comments: fragile skin, monitor BP Restrictions Weight Bearing Restrictions Per Provider Order: No       Mobility Bed Mobility Overal bed mobility: Needs Assistance             General bed mobility comments: Min A for long sitting in bed with use of B bedrails. Pt declined EOB/OOB attempts    Transfers                   General transfer comment: pt declined     Balance                                            ADL either performed or assessed with clinical judgement   ADL Overall ADL's : Needs assistance/impaired Eating/Feeding: Minimal assistance;Bed level Eating/Feeding Details (indicate cue type and reason): hand over hand assist to stabilize cup while bringing to mouth due to weakness and baseline visual deficits. Lunch arrived during session though pt declined to try anything due to not hungry yet. Grooming: Set up;Bed level;Wash/dry face                                 General ADL Comments: Focus on UB ADLs, discussion of modification for bed level ADLs w/ family assist at home, importance of movement and basic repositioning/AROM UE exercises to slowly improve strength and endurance    Extremity/Trunk Assessment Upper Extremity Assessment Upper Extremity Assessment: Generalized weakness;Right hand dominant   Lower Extremity Assessment Lower Extremity Assessment: Defer to PT evaluation        Vision   Vision Assessment?: No apparent visual deficits   Perception     Praxis      Cognition Arousal: Alert Behavior During Therapy: WFL for tasks assessed/performed Overall Cognitive Status: No family/caregiver present to determine baseline cognitive functioning                                 General  Comments: oriented, following commands WFL. Decreased problem solving and overall awareness of benefits of mobility        Exercises Exercises: Other exercises Other Exercises Other Exercises: long sitting with B bedrails x 5 Other Exercises: AROM shoulder flexion x 10 (fatigued after this)    Shoulder Instructions       General Comments BP on entry: 90/57, long sitting in bed: 83/57 (reporting some dizziness). BP after activity: 89/58    Pertinent Vitals/ Pain       Pain Assessment Pain Assessment: Faces Faces Pain Scale: Hurts a little bit Pain Location: BLE Pain Descriptors /  Indicators: Grimacing, Guarding, Sore Pain Intervention(s): Monitored during session  Home Living                                          Prior Functioning/Environment              Frequency  Min 1X/week        Progress Toward Goals  OT Goals(current goals can now be found in the care plan section)  Progress towards OT goals: OT to reassess next treatment  Acute Rehab OT Goals Patient Stated Goal: home soon OT Goal Formulation: With patient Time For Goal Achievement: 04/09/23 Potential to Achieve Goals: Fair ADL Goals Pt Will Perform Grooming: with set-up;sitting Pt Will Perform Lower Body Dressing: with mod assist;sit to/from stand Pt Will Transfer to Toilet: with min assist;stand pivot transfer  Plan      Co-evaluation                 AM-PAC OT "6 Clicks" Daily Activity     Outcome Measure   Help from another person eating meals?: A Little Help from another person taking care of personal grooming?: A Little Help from another person toileting, which includes using toliet, bedpan, or urinal?: A Lot Help from another person bathing (including washing, rinsing, drying)?: A Lot Help from another person to put on and taking off regular upper body clothing?: A Lot Help from another person to put on and taking off regular lower body clothing?: Total 6 Click Score: 13    End of Session Equipment Utilized During Treatment: Oxygen  OT Visit Diagnosis: Unsteadiness on feet (R26.81);Muscle weakness (generalized) (M62.81);History of falling (Z91.81);Low vision, both eyes (H54.2);Pain   Activity Tolerance Patient limited by fatigue   Patient Left in bed;with call bell/phone within reach   Nurse Communication Mobility status        Time: 1610-9604 OT Time Calculation (min): 18 min  Charges: OT General Charges $OT Visit: 1 Visit OT Treatments $Therapeutic Activity: 8-22 mins  Bradd Canary, OTR/L Acute Rehab Services Office: [phone]    Lorre Munroe 03/29/2023, 1:05 PM

## 2023-03-30 DIAGNOSIS — I5033 Acute on chronic diastolic (congestive) heart failure: Secondary | ICD-10-CM | POA: Diagnosis not present

## 2023-03-30 LAB — BASIC METABOLIC PANEL
Anion gap: 7 (ref 5–15)
Anion gap: 13 (ref 5–15)
BUN: 22 mg/dL (ref 8–23)
BUN: 23 mg/dL (ref 8–23)
CO2: 30 mmol/L (ref 22–32)
CO2: 26 mmol/L (ref 22–32)
Calcium: 7.7 mg/dL — ABNORMAL LOW (ref 8.9–10.3)
Calcium: 7.9 mg/dL — ABNORMAL LOW (ref 8.9–10.3)
Chloride: 96 mmol/L — ABNORMAL LOW (ref 98–111)
Chloride: 95 mmol/L — ABNORMAL LOW (ref 98–111)
Creatinine, Ser: 1.15 mg/dL — ABNORMAL HIGH (ref 0.44–1.00)
Creatinine, Ser: 1.11 mg/dL — ABNORMAL HIGH (ref 0.44–1.00)
GFR, Estimated: 49 mL/min — ABNORMAL LOW (ref >60)
GFR, Estimated: 51 mL/min — ABNORMAL LOW (ref >60)
Glucose, Bld: 98 mg/dL (ref 70–99)
Glucose, Bld: 113 mg/dL — ABNORMAL HIGH (ref 70–99)
Potassium: 5.2 mmol/L — ABNORMAL HIGH (ref 3.5–5.1)
Potassium: 4.4 mmol/L (ref 3.5–5.1)
Sodium: 133 mmol/L — ABNORMAL LOW (ref 135–145)
Sodium: 134 mmol/L — ABNORMAL LOW (ref 135–145)

## 2023-03-30 NOTE — Progress Notes (Signed)
PROGRESS NOTE    Laurie James  SWN:[id number] DOB: 01-28-1946 DOA: 03/24/2023 PCP: Georgann Housekeeper, MD  77/F w COPD, P Afib, Diastolic CHF, chronic back pain who presented with bilateral lower extremity edema.  Recent hospitalization for right hip replacement, 10/23 to 02/05/23 transfused PRBC and then discharged to SNF on PO lasix. At SNF> poor mobility, worsening lower extremity edema. Apparently she has been not adherent with her medications.  In ED< VOlume overloaded, CXR wmild cardiomegaly and positive hyperinflation, bilateral hilar vascular congestion with bilateral pleural effusions, more right than left.  - placed furosemide for diuresis.  12/16 patient responding well to diuresis.   Subjective: -Feels okay, breathing better, now wants to go to rehab, reports that she does not have anyone to help her at home  Assessment and Plan:  Acute on chronic diastolic CHF RV Failure, Pulm Hypertension -Echo w/ preserved EF 50%, RV systolic function with mild reduction, RVSP 43.7 mmHg, RA with severe dilatation, moderate to severe tricuspid valve regurgitation, moderate pulmonary regurgitation,  -Volume status improving, she is 6.3 L negative, changed to oral torsemide, potassium higher today, will repeat labs in a.m. and evening, hold off on Aldactone at this time -Cards notes reviewed, has not tolerated multiple attempts at GDMT in the past -Discharge planning, now wishes for SNF, TOC consulting  Acute hypoxemic respiratory failure due to acute cardiogenic pulmonary edema.  -wean O2, diuresis as above -dopplers neg for DVT  Recent Hip Fracture -s/p Surgery in October -PT/OT  Paroxysmal atrial fibrillation (HCC) Continue amiodarone and apixaban.   Chronic kidney disease, stage 3a (HCC) Hyponatremia. Hypokalemia.  -improved Continue diuresis with furosemide and spironolactone.   HLD (hyperlipidemia) Continue statin therapy.   CAD (coronary artery disease) No  chest pain, no acute coronary syndrome.   Hypothyroidism Continue with levothyroxine.   Protein-calorie malnutrition, severe Patient very deconditioned with poor oral intake.  Serum albumin is 1,8  Trial of megestrol for 3 days.  consult nutrition for further recommendations.   DVT prophylaxis: apixaban Code Status: DNR Family Communication: Daughter at bedside yesterday Disposition Plan: SNF this weekend  Consultants:    Procedures:   Antimicrobials:    Objective: Vitals:   03/30/23 0415 03/30/23 0543 03/30/23 0806 03/30/23 1106  BP: 103/67  105/62 102/61  Pulse:   79 84  Resp: 19  20 17   Temp: 97.8 F (36.6 C)  97.6 F (36.4 C) 98.1 F (36.7 C)  TempSrc: Oral  Oral Axillary  SpO2: 97%  97% 93%  Weight:  46 kg    Height:        Intake/Output Summary (Last 24 hours) at 03/30/2023 1128 Last data filed at 03/30/2023 0835 Gross per 24 hour  Intake 360 ml  Output 850 ml  Net -490 ml   Filed Weights   03/28/23 0312 03/29/23 0505 03/30/23 0543  Weight: 49 kg 45 kg 46 kg    Examination: Chronically ill elderly female sitting up in bed, AAO x 3 HEENT: No JVD CVS: S1-S2, regular rhythm Lungs: Poor air movement bilaterally otherwise clear Abdomen: Soft, nontender, bowel sounds present Extremities: No edema Skin: no new rashes on exposed skin     Data Reviewed:   CBC: Recent Labs  Lab 03/24/23 1352 03/29/23 0244  WBC 13.7* 11.8*  NEUTROABS 12.0*  --   HGB 11.2* 10.8*  HCT 34.4* 32.4*  MCV 89.4 86.4  PLT 411* 364   Basic Metabolic Panel: Recent Labs  Lab 03/24/23 1352 03/25/23 0257 03/26/23 0249 03/27/23 0306  03/28/23 0307 03/29/23 0244 03/30/23 0312  NA 127*   < > 127* 131* 130* 130* 133*  K 4.1   < > 3.1* 4.7 3.7 4.8 5.2*  CL 97*   < > 95* 97* 95* 95* 96*  CO2 22   < > 24 25 26 27 30   GLUCOSE 99   < > 90 111* 101* 89 98  BUN 15   < > 13 15 16 18 22   CREATININE 0.92   < > 1.03* 0.98 1.06* 1.07* 1.15*  CALCIUM 7.8*   < > 7.2* 7.4* 7.3*  7.3* 7.7*  MG 1.8  --  1.8 2.2  --   --   --    < > = values in this interval not displayed.   GFR: Estimated Creatinine Clearance: 26 mL/min (A) (by C-G formula based on SCr of 1.15 mg/dL (H)). Liver Function Tests: Recent Labs  Lab 03/24/23 1352  AST 65*  ALT 48*  ALKPHOS 109  BILITOT 1.2*  PROT 6.1*  ALBUMIN 1.8*   No results for input(s): "LIPASE", "AMYLASE" in the last 168 hours. No results for input(s): "AMMONIA" in the last 168 hours. Coagulation Profile: No results for input(s): "INR", "PROTIME" in the last 168 hours. Cardiac Enzymes: No results for input(s): "CKTOTAL", "CKMB", "CKMBINDEX", "TROPONINI" in the last 168 hours. BNP (last 3 results) No results for input(s): "PROBNP" in the last 8760 hours. HbA1C: No results for input(s): "HGBA1C" in the last 72 hours. CBG: No results for input(s): "GLUCAP" in the last 168 hours. Lipid Profile: No results for input(s): "CHOL", "HDL", "LDLCALC", "TRIG", "CHOLHDL", "LDLDIRECT" in the last 72 hours. Thyroid Function Tests: No results for input(s): "TSH", "T4TOTAL", "FREET4", "T3FREE", "THYROIDAB" in the last 72 hours. Anemia Panel: No results for input(s): "VITAMINB12", "FOLATE", "FERRITIN", "TIBC", "IRON", "RETICCTPCT" in the last 72 hours. Urine analysis:    Component Value Date/Time   COLORURINE YELLOW 05/01/2022 1922   APPEARANCEUR HAZY (A) 05/01/2022 1922   LABSPEC 1.029 05/01/2022 1922   PHURINE 5.0 05/01/2022 1922   GLUCOSEU 50 (A) 05/01/2022 1922   HGBUR SMALL (A) 05/01/2022 1922   BILIRUBINUR NEGATIVE 05/01/2022 1922   KETONESUR NEGATIVE 05/01/2022 1922   PROTEINUR 100 (A) 05/01/2022 1922   NITRITE NEGATIVE 05/01/2022 1922   LEUKOCYTESUR NEGATIVE 05/01/2022 1922   Sepsis Labs: @LABRCNTIP (procalcitonin:4,lacticidven:4)  )No results found for this or any previous visit (from the past 240 hours).   Radiology Studies: No results found.    Scheduled Meds:  amiodarone  200 mg Oral Daily   apixaban  2.5  mg Oral BID   atorvastatin  40 mg Oral Daily   feeding supplement  237 mL Oral BID BM   levothyroxine  75 mcg Oral Q0600   mometasone-formoterol  2 puff Inhalation BID   multivitamin with minerals  1 tablet Oral Daily   nystatin  5 mL Oral QID   pantoprazole  40 mg Oral BID   torsemide  20 mg Oral Daily   Continuous Infusions:  promethazine (PHENERGAN) injection (IM or IVPB) Stopped (03/25/23 1130)     LOS: 5 days   Time spent:     Zannie Cove, MD [facility]   03/30/2023, 11:28 AM

## 2023-03-30 NOTE — Progress Notes (Addendum)
2:54PM: CSW called pts son, Laurie James for choice of SNF. Laurie James states he is at work and is not able to decide at this time. Laurie James stated he sent bed choices to his dtr, Laurie James, and gave CSW permission to contact her. CSW left VM for [place] at the number listed in pts chart ([phone]).   2:59PM: Laurie James called CSW to discuss SNF list. Laurie James states she would like TOC to follow up tomorrow on SNF choice so she can go over the list with family. CSW explained once choice is given, then ins auth can be submitted for. Laurie James gave her verbal understanding and stated she will be unavailable tomorrow from 1-4PM.   4:23PM: Laurie James called CSW and stated family will choose [facility] at this time. Will need new PT note to submit for ins auth.   Johnnette Gourd, MSW, LCSWA Transitions of Care [phone]

## 2023-03-30 NOTE — TOC Progression Note (Addendum)
Transition of Care [facility]) - Progression Note    Patient Details  Name: Laurie James MRN: [id number] Date of Birth: 02-17-1946  Transition of Care [facility]) CM/SW Contact  Michaela Corner, [place] Phone Number: 03/30/2023, 10:25 AM  Clinical Narrative:   Pt and family want to pursue SNF at this time. Pt and family inquired about [facility] - CSW informed family that [facility] and [facility] do not have bed availability at this time. CSW spoke with pts husband and son Fraser Din) about other SNF offers. CSW provided SNF list to pts son Fraser Din to review. CSW will f/u on pts and families choice - Hard copy of pts choice list placed on pts chart.           Skilled Nursing Rehab Facilities-   [email or web address]     Ratings out of 5 stars (5 the highest)    Name Address  Phone # Quality Care Staffing Health Inspection Overall  [facility] [address] [place] [phone] 1 4 3   [facility] [address] [facility] [phone] 3 2 2 2   [facility] [address] [place] [phone] 5 1 2 2   [facility] [address] [place], [place] [phone] 2 2 1 1   [facility] [address] [place] [phone] 2 2 3 3   [facility] [address], [place] [phone] 4 2 1 1     Expected Discharge Plan:  (SNF vs [facility]) Barriers to Discharge: Continued Medical Work up, SNF Pending bed offer  Expected Discharge Plan and Services   Discharge Planning Services: CM Consult Post Acute Care Choice: NA Living arrangements for the past 2 months: Single Family Home                 DME Arranged: N/A DME Agency: NA       HH Arranged: NA           Social Determinants of Health (SDOH) Interventions SDOH Screenings   Food Insecurity: No Food Insecurity (03/25/2023)  Housing: Low Risk  (03/25/2023)  Transportation Needs: No Transportation Needs (03/25/2023)  Utilities: Not At Risk (03/25/2023)  Tobacco  Use: Medium Risk (03/27/2023)    Readmission Risk Interventions    01/31/2023    3:16 PM 09/07/2022    3:25 PM  Readmission Risk Prevention Plan  Transportation Screening Complete Complete  PCP or Specialist Appt within 5-7 Days  Complete  PCP or Specialist Appt within 3-5 Days Complete   Home Care Screening  Complete  Medication Review (RN CM)  Complete  HRI or Home Care Consult Complete   Palliative Care Screening Not Applicable   Medication Review (RN Care Manager) Complete

## 2023-03-30 NOTE — Plan of Care (Signed)
  Problem: Clinical Measurements: Goal: Respiratory complications will improve Outcome: Progressing   Problem: Coping: Goal: Level of anxiety will decrease Outcome: Progressing   Problem: Elimination: Goal: Will not experience complications related to bowel motility Outcome: Progressing Goal: Will not experience complications related to urinary retention Outcome: Progressing   Problem: Safety: Goal: Ability to remain free from injury will improve Outcome: Progressing   

## 2023-03-30 NOTE — Progress Notes (Signed)
Physical Therapy Treatment Patient Details Name: Laurie James MRN: [id number] DOB: 1945-06-03 Today's Date: 03/30/2023   History of Present Illness Pt is a 77 y.o. female presenting 12/14 with BLE edema. PMH significant for COPD, paroxysmal a-fib on anticoagulation, CGF with bil pleural effisions, osteoporosis, macular degeneration, back pain, R hip fx s/p IM nailing in October 2024.    PT Comments  Pt resting in bed at start and initially pt agreeable to participate in bed level exercises despite feeling unsure how much she could tolerate due to fatigue. Pt then reported having episode of BM and need of assist to clean up. Max assist to roll Rt/Lt and total assist for pericare. Pt fatigued following bed mobility and reported urge to continue BM. Further exercises declined by pt and pt repositioned in Lt sidelying in case of ongoing BM. NT present and aware pt will need assist to reposition once feeling relieved. Will continue to progress as able. Patient will benefit from continued inpatient follow up therapy, <3 hours/day.   If plan is discharge home, recommend the following: Two people to help with walking and/or transfers;Two people to help with bathing/dressing/bathroom;Assistance with feeding;Assistance with cooking/housework;Direct supervision/assist for medications management;Direct supervision/assist for financial management;Assist for transportation;Help with stairs or ramp for entrance   Can travel by private vehicle     No  Equipment Recommendations  Hospital bed;Hoyer lift;Wheelchair cushion (measurements PT);Wheelchair (measurements PT) (air mattress)    Recommendations for Other Services       Precautions / Restrictions Precautions Precautions: Fall Precaution Comments: fragile skin, monitor BP Restrictions Weight Bearing Restrictions Per Provider Order: No     Mobility  Bed Mobility Overal bed mobility: Needs Assistance Bed Mobility: Rolling Rolling:  Max assist, Used rails         General bed mobility comments: max assist and cues for sequencing with use of bed rail. pt rolled Lt/Rt with assist of bed pad to facilitate turning hips. Total assist for pericare and sacral patch replaced. EOS pt repositioned on Lt side as pt reported ongoing urge for BM.    Transfers                        Ambulation/Gait                   Stairs             Wheelchair Mobility     Tilt Bed    Modified Rankin (Stroke Patients Only)       Balance Overall balance assessment: Needs assistance Sitting-balance support: Single extremity supported, Feet supported Sitting balance-Leahy Scale: Poor Sitting balance - Comments: CGA with cues for maintaining                                    Cognition Arousal: Alert Behavior During Therapy: WFL for tasks assessed/performed Overall Cognitive Status: No family/caregiver present to determine baseline cognitive functioning                                 General Comments: oriented, following commands WFL. Decreased problem solving and overall awareness of benefits of mobility        Exercises      General Comments        Pertinent Vitals/Pain Pain Assessment Pain Assessment: Faces Faces Pain Scale: Hurts a  little bit Pain Location: BLE Pain Descriptors / Indicators: Grimacing, Guarding, Sore Pain Intervention(s): Limited activity within patient's tolerance, Monitored during session, Repositioned    Home Living                          Prior Function            PT Goals (current goals can now be found in the care plan section) Acute Rehab PT Goals Patient Stated Goal: improve strength and mobility PT Goal Formulation: With patient Time For Goal Achievement: 04/09/23 Potential to Achieve Goals: Fair Progress towards PT goals: Progressing toward goals    Frequency    Min 1X/week      PT Plan       Co-evaluation              AM-PAC PT "6 Clicks" Mobility   Outcome Measure  Help needed turning from your back to your side while in a flat bed without using bedrails?: Total Help needed moving from lying on your back to sitting on the side of a flat bed without using bedrails?: Total Help needed moving to and from a bed to a chair (including a wheelchair)?: Total Help needed standing up from a chair using your arms (e.g., wheelchair or bedside chair)?: Total Help needed to walk in hospital room?: Total Help needed climbing 3-5 steps with a railing? : Total 6 Click Score: 6    End of Session Equipment Utilized During Treatment: Gait belt Activity Tolerance: Patient tolerated treatment well;Patient limited by fatigue Patient left: in bed;with call bell/phone within reach;with bed alarm set;with nursing/sitter in room Nurse Communication: Mobility status PT Visit Diagnosis: Unsteadiness on feet (R26.81);Muscle weakness (generalized) (M62.81);Difficulty in walking, not elsewhere classified (R26.2)     Time: 6213-0865 PT Time Calculation (min) (ACUTE ONLY): 20 min  Charges:    $Therapeutic Activity: 8-22 mins PT General Charges $$ ACUTE PT VISIT: 1 Visit                     Wynn Maudlin, DPT Acute Rehabilitation Services Office [phone]  03/30/23 1:22 PM

## 2023-03-31 DIAGNOSIS — I5033 Acute on chronic diastolic (congestive) heart failure: Secondary | ICD-10-CM | POA: Diagnosis not present

## 2023-03-31 LAB — BASIC METABOLIC PANEL
Anion gap: 9 (ref 5–15)
BUN: 25 mg/dL — ABNORMAL HIGH (ref 8–23)
CO2: 29 mmol/L (ref 22–32)
Calcium: 8 mg/dL — ABNORMAL LOW (ref 8.9–10.3)
Chloride: 95 mmol/L — ABNORMAL LOW (ref 98–111)
Creatinine, Ser: 1.11 mg/dL — ABNORMAL HIGH (ref 0.44–1.00)
GFR, Estimated: 51 mL/min — ABNORMAL LOW (ref >60)
Glucose, Bld: 96 mg/dL (ref 70–99)
Potassium: 4.3 mmol/L (ref 3.5–5.1)
Sodium: 133 mmol/L — ABNORMAL LOW (ref 135–145)

## 2023-03-31 MED ORDER — SPIRONOLACTONE 12.5 MG HALF TABLET
12.5000 mg | ORAL_TABLET | Freq: Every day | ORAL | Status: DC
  Administered 2023-03-31 – 2023-04-02 (×3): 12.5 mg via ORAL
  Filled 2023-03-31 (×3): qty 1

## 2023-03-31 NOTE — Progress Notes (Signed)
PROGRESS NOTE    Laurie James [place]  AOZ:[id number] DOB: Sep 26, 1945 DOA: 03/24/2023 PCP: Georgann Housekeeper, MD  77/F w COPD, P Afib, Diastolic CHF, chronic back pain who presented with bilateral lower extremity edema.  Recent hospitalization for right hip replacement, 10/23 to 02/05/23 transfused PRBC and then discharged to SNF on PO lasix. At SNF> poor mobility, worsening lower extremity edema. Apparently she has been not adherent with her medications.  In ED< VOlume overloaded, CXR wmild cardiomegaly and positive hyperinflation, bilateral hilar vascular congestion with bilateral pleural effusions, more right than left.  - placed furosemide for diuresis.  12/16 patient responding well to diuresis.   Subjective: -Feels okay, waiting for rehab  Assessment and Plan:  Acute on chronic diastolic CHF RV Failure, Pulm Hypertension -Echo w/ preserved EF 50%, RV systolic function with mild reduction, RVSP 43.7 mmHg, RA with severe dilatation, moderate to severe tricuspid valve regurgitation, moderate pulmonary regurgitation,  -Volume status improving, she is 6.3 L negative, changed to oral torsemide, potassium has improved, trial of  Aldactone -Cards notes reviewed, has not tolerated multiple attempts at GDMT in the past -Discharge planning, now wishes for SNF, TOC consulting  Acute hypoxemic respiratory failure due to acute cardiogenic pulmonary edema.  -wean O2, diuresis as above -dopplers neg for DVT  Recent Hip Fracture -s/p Surgery in October -PT/OT  Paroxysmal atrial fibrillation (HCC) Continue amiodarone and apixaban.   Chronic kidney disease, stage 3a (HCC) Hyponatremia. Hypokalemia.  -improved Continue diuresis with furosemide and spironolactone.   HLD (hyperlipidemia) Continue statin therapy.   CAD (coronary artery disease) No chest pain, no acute coronary syndrome.   Hypothyroidism Continue with levothyroxine.   Protein-calorie malnutrition, severe Patient  very deconditioned with poor oral intake.  Serum albumin is 1,8   consult nutrition for further recommendations.   DVT prophylaxis: apixaban Code Status: DNR Family Communication: Daughter at bedside yesterday Disposition Plan: SNF this weekend  Consultants:    Procedures:   Antimicrobials:    Objective: Vitals:   03/30/23 2001 03/30/23 2358 03/31/23 0532 03/31/23 0820  BP: 104/63 107/67 97/66 108/67  Pulse: 84 88 88 86  Resp: 17 17 16 18   Temp: 98.4 F (36.9 C) 98.5 F (36.9 C) 97.7 F (36.5 C) 97.7 F (36.5 C)  TempSrc: Oral Oral Oral Oral  SpO2: 94% 94% 94% 94%  Weight:   46.5 kg   Height:        Intake/Output Summary (Last 24 hours) at 03/31/2023 1148 Last data filed at 03/30/2023 1850 Gross per 24 hour  Intake 120 ml  Output 300 ml  Net -180 ml   Filed Weights   03/29/23 0505 03/30/23 0543 03/31/23 0532  Weight: 45 kg 46 kg 46.5 kg    Examination: Chronically ill elderly female sitting up in bed, AAOx3 HEENT: No JVD CVS: S1-S2, regular rhythm Lungs: Poor air movement bilaterally otherwise clear Abdomen: Soft, nontender, bowel sounds present Extremities: No edema  Skin: no new rashes on exposed skin     Data Reviewed:   CBC: Recent Labs  Lab 03/24/23 1352 03/29/23 0244  WBC 13.7* 11.8*  NEUTROABS 12.0*  --   HGB 11.2* 10.8*  HCT 34.4* 32.4*  MCV 89.4 86.4  PLT 411* 364   Basic Metabolic Panel: Recent Labs  Lab 03/24/23 1352 03/25/23 0257 03/26/23 0249 03/27/23 0306 03/28/23 0307 03/29/23 0244 03/30/23 0312 03/30/23 1808 03/31/23 0239  NA 127*   < > 127* 131* 130* 130* 133* 134* 133*  K 4.1   < >  3.1* 4.7 3.7 4.8 5.2* 4.4 4.3  CL 97*   < > 95* 97* 95* 95* 96* 95* 95*  CO2 22   < > 24 25 26 27 30 26 29   GLUCOSE 99   < > 90 111* 101* 89 98 113* 96  BUN 15   < > 13 15 16 18 22 23  25*  CREATININE 0.92   < > 1.03* 0.98 1.06* 1.07* 1.15* 1.11* 1.11*  CALCIUM 7.8*   < > 7.2* 7.4* 7.3* 7.3* 7.7* 7.9* 8.0*  MG 1.8  --  1.8 2.2  --    --   --   --   --    < > = values in this interval not displayed.   GFR: Estimated Creatinine Clearance: 27.1 mL/min (A) (by C-G formula based on SCr of 1.11 mg/dL (H)). Liver Function Tests: Recent Labs  Lab 03/24/23 1352  AST 65*  ALT 48*  ALKPHOS 109  BILITOT 1.2*  PROT 6.1*  ALBUMIN 1.8*   No results for input(s): "LIPASE", "AMYLASE" in the last 168 hours. No results for input(s): "AMMONIA" in the last 168 hours. Coagulation Profile: No results for input(s): "INR", "PROTIME" in the last 168 hours. Cardiac Enzymes: No results for input(s): "CKTOTAL", "CKMB", "CKMBINDEX", "TROPONINI" in the last 168 hours. BNP (last 3 results) No results for input(s): "PROBNP" in the last 8760 hours. HbA1C: No results for input(s): "HGBA1C" in the last 72 hours. CBG: No results for input(s): "GLUCAP" in the last 168 hours. Lipid Profile: No results for input(s): "CHOL", "HDL", "LDLCALC", "TRIG", "CHOLHDL", "LDLDIRECT" in the last 72 hours. Thyroid Function Tests: No results for input(s): "TSH", "T4TOTAL", "FREET4", "T3FREE", "THYROIDAB" in the last 72 hours. Anemia Panel: No results for input(s): "VITAMINB12", "FOLATE", "FERRITIN", "TIBC", "IRON", "RETICCTPCT" in the last 72 hours. Urine analysis:    Component Value Date/Time   COLORURINE YELLOW 05/01/2022 1922   APPEARANCEUR HAZY (A) 05/01/2022 1922   LABSPEC 1.029 05/01/2022 1922   PHURINE 5.0 05/01/2022 1922   GLUCOSEU 50 (A) 05/01/2022 1922   HGBUR SMALL (A) 05/01/2022 1922   BILIRUBINUR NEGATIVE 05/01/2022 1922   KETONESUR NEGATIVE 05/01/2022 1922   PROTEINUR 100 (A) 05/01/2022 1922   NITRITE NEGATIVE 05/01/2022 1922   LEUKOCYTESUR NEGATIVE 05/01/2022 1922   Sepsis Labs: @LABRCNTIP (procalcitonin:4,lacticidven:4)  )No results found for this or any previous visit (from the past 240 hours).   Radiology Studies: No results found.    Scheduled Meds:  amiodarone  200 mg Oral Daily   apixaban  2.5 mg Oral BID    atorvastatin  40 mg Oral Daily   feeding supplement  237 mL Oral BID BM   levothyroxine  75 mcg Oral Q0600   mometasone-formoterol  2 puff Inhalation BID   multivitamin with minerals  1 tablet Oral Daily   nystatin  5 mL Oral QID   pantoprazole  40 mg Oral BID   spironolactone  12.5 mg Oral Daily   torsemide  20 mg Oral Daily   Continuous Infusions:  promethazine (PHENERGAN) injection (IM or IVPB) Stopped (03/25/23 1130)     LOS: 6 days   Time spent:     Zannie Cove, MD [facility]   03/31/2023, 11:48 AM

## 2023-03-31 NOTE — Plan of Care (Signed)
Pt po intake improving with current diet

## 2023-04-01 DIAGNOSIS — I5033 Acute on chronic diastolic (congestive) heart failure: Secondary | ICD-10-CM | POA: Diagnosis not present

## 2023-04-01 LAB — BASIC METABOLIC PANEL
Anion gap: 10 (ref 5–15)
BUN: 28 mg/dL — ABNORMAL HIGH (ref 8–23)
CO2: 28 mmol/L (ref 22–32)
Calcium: 8.2 mg/dL — ABNORMAL LOW (ref 8.9–10.3)
Chloride: 97 mmol/L — ABNORMAL LOW (ref 98–111)
Creatinine, Ser: 1.14 mg/dL — ABNORMAL HIGH (ref 0.44–1.00)
GFR, Estimated: 50 mL/min — ABNORMAL LOW (ref >60)
Glucose, Bld: 108 mg/dL — ABNORMAL HIGH (ref 70–99)
Potassium: 4.5 mmol/L (ref 3.5–5.1)
Sodium: 135 mmol/L (ref 135–145)

## 2023-04-01 NOTE — Plan of Care (Signed)
  Problem: Clinical Measurements: Goal: Will remain free from infection Outcome: Progressing Goal: Respiratory complications will improve Outcome: Progressing Goal: Cardiovascular complication will be avoided Outcome: Progressing   Problem: Activity: Goal: Risk for activity intolerance will decrease Outcome: Progressing   Problem: Elimination: Goal: Will not experience complications related to bowel motility Outcome: Progressing   Problem: Safety: Goal: Ability to remain free from injury will improve Outcome: Progressing

## 2023-04-01 NOTE — Progress Notes (Signed)
PROGRESS NOTE    Laurie James  ION:[id number] DOB: 01/17/1946 DOA: 03/24/2023 PCP: Georgann Housekeeper, MD  77/F w COPD, P Afib, Diastolic CHF, chronic back pain who presented with bilateral lower extremity edema.  Recent hospitalization for right hip replacement, 10/23 to 02/05/23 transfused PRBC and then discharged to SNF on PO lasix. At SNF> poor mobility, worsening lower extremity edema. Apparently she has been not adherent with her medications.  In ED< VOlume overloaded, CXR wmild cardiomegaly and positive hyperinflation, bilateral hilar vascular congestion with bilateral pleural effusions, more right than left.  - placed furosemide for diuresis.  -Improved from a volume status -Medically stable, now awaiting rehab  Subjective: -Feels okay overall, still waiting for rehab  Assessment and Plan:  Acute on chronic diastolic CHF RV Failure, Pulm Hypertension -Echo w/ preserved EF 50%, RV systolic function with mild reduction, RVSP 43.7 mmHg, RA with severe dilatation, moderate to severe tricuspid valve regurgitation, moderate pulmonary regurgitation,  -Volume status improving, she is 7.2 L negative, changed to oral torsemide, potassium has improved, started low-dose Aldactone yesterday, BMP in a.m. -Cards notes reviewed, has not tolerated multiple attempts at GDMT in the past -Discharge planning, medically stable, awaiting SNF  Acute hypoxemic respiratory failure due to acute cardiogenic pulmonary edema.  -wean O2, diuresis as above -dopplers neg for DVT  Recent Hip Fracture -s/p Surgery in October -PT/OT  Paroxysmal atrial fibrillation (HCC) Continue amiodarone and apixaban.   Chronic kidney disease, stage 3a (HCC) Hyponatremia. Hypokalemia.  -improved Continue diuresis with furosemide and spironolactone.   HLD (hyperlipidemia) Continue statin therapy.   CAD (coronary artery disease) No chest pain, no acute coronary syndrome.   Hypothyroidism Continue with  levothyroxine.   Protein-calorie malnutrition, severe Patient very deconditioned with poor oral intake.  Serum albumin is 1,8   consult nutrition for further recommendations.   DVT prophylaxis: apixaban Code Status: DNR Family Communication: No family at bedside today Disposition Plan: SNF when bed available  Consultants:    Procedures:   Antimicrobials:    Objective: Vitals:   03/31/23 2003 03/31/23 2358 04/01/23 0431 04/01/23 0745  BP: 107/63 112/70 (!) 110/58 101/61  Pulse: 84 84 83 87  Resp: 20 (!) 23 20 (!) 22  Temp: 97.9 F (36.6 C) 98.2 F (36.8 C) 97.8 F (36.6 C) 98 F (36.7 C)  TempSrc: Oral Axillary Axillary Oral  SpO2: 96% 95% 97% 97%  Weight:      Height:        Intake/Output Summary (Last 24 hours) at 04/01/2023 1007 Last data filed at 03/31/2023 2358 Gross per 24 hour  Intake 220 ml  Output 450 ml  Net -230 ml   Filed Weights   03/29/23 0505 03/30/23 0543 03/31/23 0532  Weight: 45 kg 46 kg 46.5 kg    Examination: Chronically ill elderly female sitting up in bed, AAOx3 HEENT: No JVD CVS: S1-S2, regular rhythm Lungs: Poor air movement bilaterally otherwise clear Abdomen: Soft, nontender, bowel sounds present Extremities: No edema  Skin: no new rashes on exposed skin     Data Reviewed:   CBC: Recent Labs  Lab 03/29/23 0244  WBC 11.8*  HGB 10.8*  HCT 32.4*  MCV 86.4  PLT 364   Basic Metabolic Panel: Recent Labs  Lab 03/26/23 0249 03/27/23 0306 03/28/23 0307 03/29/23 0244 03/30/23 0312 03/30/23 1808 03/31/23 0239 04/01/23 0226  NA 127* 131*   < > 130* 133* 134* 133* 135  K 3.1* 4.7   < > 4.8 5.2* 4.4 4.3 4.5  CL 95* 97*   < > 95* 96* 95* 95* 97*  CO2 24 25   < > 27 30 26 29 28   GLUCOSE 90 111*   < > 89 98 113* 96 108*  BUN 13 15   < > 18 22 23  25* 28*  CREATININE 1.03* 0.98   < > 1.07* 1.15* 1.11* 1.11* 1.14*  CALCIUM 7.2* 7.4*   < > 7.3* 7.7* 7.9* 8.0* 8.2*  MG 1.8 2.2  --   --   --   --   --   --    < > = values in  this interval not displayed.   GFR: Estimated Creatinine Clearance: 26.4 mL/min (A) (by C-G formula based on SCr of 1.14 mg/dL (H)). Liver Function Tests: No results for input(s): "AST", "ALT", "ALKPHOS", "BILITOT", "PROT", "ALBUMIN" in the last 168 hours.  No results for input(s): "LIPASE", "AMYLASE" in the last 168 hours. No results for input(s): "AMMONIA" in the last 168 hours. Coagulation Profile: No results for input(s): "INR", "PROTIME" in the last 168 hours. Cardiac Enzymes: No results for input(s): "CKTOTAL", "CKMB", "CKMBINDEX", "TROPONINI" in the last 168 hours. BNP (last 3 results) No results for input(s): "PROBNP" in the last 8760 hours. HbA1C: No results for input(s): "HGBA1C" in the last 72 hours. CBG: No results for input(s): "GLUCAP" in the last 168 hours. Lipid Profile: No results for input(s): "CHOL", "HDL", "LDLCALC", "TRIG", "CHOLHDL", "LDLDIRECT" in the last 72 hours. Thyroid Function Tests: No results for input(s): "TSH", "T4TOTAL", "FREET4", "T3FREE", "THYROIDAB" in the last 72 hours. Anemia Panel: No results for input(s): "VITAMINB12", "FOLATE", "FERRITIN", "TIBC", "IRON", "RETICCTPCT" in the last 72 hours. Urine analysis:    Component Value Date/Time   COLORURINE YELLOW 05/01/2022 1922   APPEARANCEUR HAZY (A) 05/01/2022 1922   LABSPEC 1.029 05/01/2022 1922   PHURINE 5.0 05/01/2022 1922   GLUCOSEU 50 (A) 05/01/2022 1922   HGBUR SMALL (A) 05/01/2022 1922   BILIRUBINUR NEGATIVE 05/01/2022 1922   KETONESUR NEGATIVE 05/01/2022 1922   PROTEINUR 100 (A) 05/01/2022 1922   NITRITE NEGATIVE 05/01/2022 1922   LEUKOCYTESUR NEGATIVE 05/01/2022 1922   Sepsis Labs: @LABRCNTIP (procalcitonin:4,lacticidven:4)  )No results found for this or any previous visit (from the past 240 hours).   Radiology Studies: No results found.    Scheduled Meds:  amiodarone  200 mg Oral Daily   apixaban  2.5 mg Oral BID   atorvastatin  40 mg Oral Daily   feeding supplement   237 mL Oral BID BM   levothyroxine  75 mcg Oral Q0600   mometasone-formoterol  2 puff Inhalation BID   multivitamin with minerals  1 tablet Oral Daily   nystatin  5 mL Oral QID   pantoprazole  40 mg Oral BID   spironolactone  12.5 mg Oral Daily   torsemide  20 mg Oral Daily   Continuous Infusions:  promethazine (PHENERGAN) injection (IM or IVPB) Stopped (03/25/23 1130)     LOS: 7 days   Time spent:     Zannie Cove, MD [facility]   04/01/2023, 10:07 AM

## 2023-04-02 ENCOUNTER — Other Ambulatory Visit (HOSPITAL_COMMUNITY): Payer: Self-pay

## 2023-04-02 DIAGNOSIS — I5033 Acute on chronic diastolic (congestive) heart failure: Secondary | ICD-10-CM | POA: Diagnosis not present

## 2023-04-02 MED ORDER — SPIRONOLACTONE 25 MG PO TABS
12.5000 mg | ORAL_TABLET | Freq: Every day | ORAL | 1 refills | Status: DC
  Filled 2023-04-02 – 2023-04-03 (×2): qty 30, 60d supply, fill #0

## 2023-04-02 MED ORDER — AMIODARONE HCL 200 MG PO TABS: 200.0000 mg | ORAL_TABLET | Freq: Every day | ORAL | Status: DC

## 2023-04-02 MED ORDER — TORSEMIDE 20 MG PO TABS
20.0000 mg | ORAL_TABLET | Freq: Every day | ORAL | 1 refills | Status: DC
  Filled 2023-04-02 – 2023-04-03 (×2): qty 30, 30d supply, fill #0

## 2023-04-02 NOTE — Plan of Care (Signed)
  Problem: Clinical Measurements: Goal: Cardiovascular complication will be avoided Outcome: Progressing   Problem: Safety: Goal: Ability to remain free from injury will improve Outcome: Progressing   Problem: Skin Integrity: Goal: Risk for impaired skin integrity will decrease Outcome: Progressing   Problem: Education: Goal: Ability to demonstrate management of disease process will improve Outcome: Progressing Goal: Ability to verbalize understanding of medication therapies will improve Outcome: Progressing Goal: Individualized Educational Video(s) Outcome: Progressing   Problem: Activity: Goal: Capacity to carry out activities will improve Outcome: Progressing   Problem: Cardiac: Goal: Ability to achieve and maintain adequate cardiopulmonary perfusion will improve Outcome: Progressing

## 2023-04-02 NOTE — Discharge Summary (Signed)
Physician Discharge Summary  Laurie James Park DVV:[id number] DOB: 04/20/1945 DOA: 03/24/2023  PCP: Georgann Housekeeper, MD  Admit date: 03/24/2023 Discharge date: 04/02/2023  Time spent: 45 minutes  Recommendations for Outpatient Follow-up:  PCP in 1 week, please check BMP at follow-up Advanced heart failure clinic, she has a follow-up on 1/3   Discharge Diagnoses:  Principal Problem:   Acute on chronic diastolic CHF (congestive heart failure) (HCC) Pulmonary hypertension COPD Severe protein calorie malnutrition   Paroxysmal atrial fibrillation (HCC)   Chronic kidney disease, stage 3a (HCC)   HLD (hyperlipidemia)   CAD (coronary artery disease)   Hypothyroidism   Protein-calorie malnutrition, severe   Heart failure (HCC) DO NOT RESUSCITATE  Discharge Condition: Improved  Diet recommendation: Low-sodium, heart healthy  Filed Weights   03/30/23 0543 03/31/23 0532 04/02/23 0423  Weight: 46 kg 46.5 kg 47 kg    History of present illness:  77/F w COPD, P Afib, Diastolic CHF, chronic back pain who presented with bilateral lower extremity edema.  Recent hospitalization for right hip replacement, 10/23 to 02/05/23 transfused PRBC and then discharged to SNF on PO lasix. At SNF> poor mobility, worsening lower extremity edema. Apparently she has been not adherent with her medications.  In ED< VOlume overloaded, CXR wmild cardiomegaly and positive hyperinflation, bilateral hilar vascular congestion with bilateral pleural effusions, more right than left.   Hospital Course:   Acute on chronic diastolic CHF RV Failure, Pulm Hypertension -Echo w/ preserved EF 50%, RV systolic function with mild reduction, RVSP 43.7 mmHg, RA with severe dilatation, moderate to severe tricuspid valve regurgitation, moderate pulmonary regurgitation,  -Volume status improving, she is 7.2 L negative, changed to oral torsemide, started low-dose Aldactone  -Cards notes reviewed, has not tolerated  multiple attempts at GDMT in the past -Discharge planning, initially wanted SNF, now insists on discharge home instead, we will set up home health services, follow-up with advanced heart failure clinic   Acute hypoxemic respiratory failure due to acute cardiogenic pulmonary edema.  -Resolved -weaned off  O2, diuresis as above -dopplers neg for DVT   Recent Hip Fracture -s/p Surgery in October -PT/OT> home health services set up   Paroxysmal atrial fibrillation (HCC) Continue amiodarone and apixaban.    Chronic kidney disease, stage 3a (HCC) Hyponatremia. Hypokalemia.  -improved Continue furosemide and spironolactone.    HLD (hyperlipidemia) Continue statin therapy.    CAD (coronary artery disease) No chest pain, no acute coronary syndrome.    Hypothyroidism Continue with levothyroxine.    Protein-calorie malnutrition, severe Patient very deconditioned with poor oral intake.  Serum albumin is 1,8   Dietitian consulted  Code Status: DNR  Discharge Exam: Vitals:   04/02/23 0814 04/02/23 1045  BP: 106/61 107/67  Pulse: 83 84  Resp: 19 20  Temp: (!) 97.5 F (36.4 C) (!) 97.5 F (36.4 C)  SpO2: 97% 98%   Examination: Chronically ill elderly female sitting up in bed, AAOx3 HEENT: No JVD CVS: S1-S2, regular rhythm Lungs: Poor air movement bilaterally otherwise clear Abdomen: Soft, nontender, bowel sounds present Extremities: No edema  Skin: no new rashes on exposed skin   Discharge Instructions   Discharge Instructions     Diet - low sodium heart healthy   Complete by: As directed    Discharge wound care:   Complete by: As directed    routine   Increase activity slowly   Complete by: As directed       Allergies as of 04/02/2023  Reactions   Codeine Nausea And Vomiting, Other (See Comments)   Upsets the stomach        Medication List     STOP taking these medications    furosemide 20 MG tablet Commonly known as: LASIX   nystatin  100000 UNIT/ML suspension Commonly known as: MYCOSTATIN   potassium chloride 10 MEQ tablet Commonly known as: KLOR-CON       TAKE these medications    albuterol 108 (90 Base) MCG/ACT inhaler Commonly known as: VENTOLIN HFA Inhale 2 puffs into the lungs every 4 (four) hours as needed for wheezing.   amiodarone 200 MG tablet Commonly known as: PACERONE Take 1 tablet (200 mg total) by mouth daily. What changed: See the new instructions.   atorvastatin 40 MG tablet Commonly known as: LIPITOR Stop taking until you see your PCP and obtain repeat labwork. What changed:  how much to take how to take this when to take this   budesonide-formoterol 80-4.5 MCG/ACT inhaler Commonly known as: Symbicort Inhale 2 puffs into the lungs in the morning and at bedtime.   cyanocobalamin 1000 MCG/ML injection Commonly known as: VITAMIN B12 Inject 1,000 mcg into the muscle every 30 (thirty) days.   Eliquis 2.5 MG Tabs tablet Generic drug: apixaban Take 1 tablet by mouth twice daily   nitroGLYCERIN 0.4 MG SL tablet Commonly known as: NITROSTAT Place 1 tablet (0.4 mg total) under the tongue every 5 (five) minutes x 3 doses as needed for chest pain.   ondansetron 4 MG tablet Commonly known as: ZOFRAN Take 4 mg by mouth 3 (three) times daily as needed for nausea or vomiting.   pantoprazole 40 MG tablet Commonly known as: PROTONIX Take 1 tablet (40 mg total) by mouth 2 (two) times daily.   spironolactone 25 MG tablet Commonly known as: ALDACTONE Take 0.5 tablets (12.5 mg total) by mouth daily. Start taking on: April 03, 2023   Synthroid 75 MCG tablet Generic drug: levothyroxine Take 75 mcg by mouth daily before breakfast.   torsemide 20 MG tablet Commonly known as: DEMADEX Take 1 tablet (20 mg total) by mouth daily. Start taking on: April 03, 2023               Discharge Care Instructions  (From admission, onward)           Start     Ordered   04/02/23 0000   Discharge wound care:       Comments: routine   04/02/23 1001           Allergies  Allergen Reactions   Codeine Nausea And Vomiting and Other (See Comments)    Upsets the stomach    Contact information for follow-up providers     [facility] Follow up.   Specialty: Cardiology Why: 04/13/23 at 1:30 PM   Hospital follow-up in the Advanced Heart Failure Clinic at [facility], Doran Stabler information: [address] [place] [address] [phone]        Georgann Housekeeper, MD Follow up.   Specialty: Internal Medicine Why: Please follow up in a week. Contact information: [address] [place] [address] [phone]              Contact information for after-discharge care     Destination     [facility], [facility]erred [facility] .   Service: Skilled Nursing Contact information: [address] [place] [address]  [phone]                      The results of significant diagnostics from this hospitalization (including imaging, microbiology, ancillary and laboratory) are listed below for reference.    Significant Diagnostic Studies: VAS [place] LOWER EXTREMITY VENOUS (DVT) Result Date: 03/27/2023  Lower Venous DVT Study Patient Name:  [facility] [name]  Date of Exam:   03/27/2023 Medical Rec #: [id number]                    Accession #:    [id number] Date of Birth: June 20, 1945                    Patient Gender: F Patient Age:   48 years Exam Location:  [facility] Procedure:      VAS [place] LOWER EXTREMITY VENOUS (DVT) Referring Phys: Erin Hearing --------------------------------------------------------------------------------  Indications: Edema. Other Indications: Afib, CHF. Comparison Study: No prior exam. Performing Technologist: Fernande Bras  Examination Guidelines: A complete evaluation includes B-mode  imaging, spectral Doppler, color Doppler, and power Doppler as needed of all accessible portions of each vessel. Bilateral testing is considered an integral part of a complete examination. Limited examinations for reoccurring indications may be performed as noted. The reflux portion of the exam is performed with the patient in reverse Trendelenburg.  +---------+---------------+---------+-----------+----------+-------------------+ RIGHT    CompressibilityPhasicitySpontaneityPropertiesThrombus Aging      +---------+---------------+---------+-----------+----------+-------------------+ CFV      Full           Yes      Yes                  Chronic vs intimal                                                        thickening.         +---------+---------------+---------+-----------+----------+-------------------+ SFJ      Full           Yes      Yes                                      +---------+---------------+---------+-----------+----------+-------------------+ FV Prox  Full                                                             +---------+---------------+---------+-----------+----------+-------------------+ FV Mid   Full                                                             +---------+---------------+---------+-----------+----------+-------------------+ FV DistalFull           Yes      Yes                                      +---------+---------------+---------+-----------+----------+-------------------+  PFV      Full                                                             +---------+---------------+---------+-----------+----------+-------------------+ POP      Full           Yes      Yes                                      +---------+---------------+---------+-----------+----------+-------------------+ PTV      Full                                                              +---------+---------------+---------+-----------+----------+-------------------+ PERO     Full                                                             +---------+---------------+---------+-----------+----------+-------------------+ Only superior PTV and Peros visualized, mid and distal segments bandaged. Chronic vs intimal thickening visualized in right CFV.  +---------+---------------+---------+-----------+----------+--------------+ LEFT     CompressibilityPhasicitySpontaneityPropertiesThrombus Aging +---------+---------------+---------+-----------+----------+--------------+ CFV      Full           Yes      Yes                                 +---------+---------------+---------+-----------+----------+--------------+ SFJ      Full           Yes      Yes                                 +---------+---------------+---------+-----------+----------+--------------+ FV Prox  Full                                                        +---------+---------------+---------+-----------+----------+--------------+ FV Mid   Full                                                        +---------+---------------+---------+-----------+----------+--------------+ FV DistalFull                                                        +---------+---------------+---------+-----------+----------+--------------+ PFV      Full                                                        +---------+---------------+---------+-----------+----------+--------------+  POP      Full           Yes      Yes                                 +---------+---------------+---------+-----------+----------+--------------+ PTV      Full                                                        +---------+---------------+---------+-----------+----------+--------------+ PERO     Full                                                         +---------+---------------+---------+-----------+----------+--------------+     Summary: BILATERAL: - No evidence of deep vein thrombosis seen in the lower extremities, bilaterally. -No evidence of popliteal cyst, bilaterally.   *See table(s) above for measurements and observations. Electronically signed by Lemar Livings MD on 03/27/2023 at 6:55:40 PM.    Final    ECHOCARDIOGRAM LIMITED Result Date: 03/26/2023    ECHOCARDIOGRAM LIMITED REPORT   Patient Name:   [facility] [name] Date of Exam: 03/26/2023 Medical Rec #:  [id number]                   Height:       56.0 in Accession #:    [id number]                  Weight:       118.8 lb Date of Birth:  05/25/1945                   BSA:          1.423 m Patient Age:    77 years                    BP:           94/54 mmHg Patient Gender: F                           HR:           61 bpm. Exam Location:  Inpatient Procedure: 2D Echo, Color Doppler and Limited Color Doppler Indications:    CHF  History:        Patient has prior history of Echocardiogram examinations.  Sonographer:    Darlys Gales Referring Phys: [id number] MAURICIO DANIEL ARRIEN IMPRESSIONS  1. Left ventricular ejection fraction, by estimation, is 50%. The left ventricle has mildly decreased function. The left ventricle demonstrates global hypokinesis.  2. D-shaped interventricular septum suggesting RV pressure/volume overload. Right ventricular systolic function is mildly reduced. The right ventricular size is moderately enlarged. There is mildly elevated pulmonary artery systolic pressure. The estimated right ventricular systolic pressure is 43.7 mmHg.  3. Right atrial size was severely dilated.  4. The mitral valve is normal in structure. No evidence of mitral valve regurgitation. No evidence of mitral stenosis.  5. The tricuspid valve is abnormal. Tricuspid valve regurgitation is moderate to severe.  6. The aortic valve is tricuspid.  There is mild calcification of the aortic valve. Aortic valve  regurgitation is not visualized. No aortic stenosis is present.  7. Pulmonic valve regurgitation is moderate.  8. The inferior vena cava is normal in size with greater than 50% respiratory variability, suggesting right atrial pressure of 3 mmHg. FINDINGS  Left Ventricle: Left ventricular ejection fraction, by estimation, is 50%. The left ventricle has mildly decreased function. The left ventricle demonstrates global hypokinesis. Right Ventricle: D-shaped interventricular septum suggesting RV pressure/volume overload. The right ventricular size is moderately enlarged. Right ventricular systolic function is mildly reduced. There is mildly elevated pulmonary artery systolic pressure. The tricuspid regurgitant velocity is 3.19 m/s, and with an assumed right atrial pressure of 3 mmHg, the estimated right ventricular systolic pressure is 43.7 mmHg. Left Atrium: Left atrial size was normal in size. Right Atrium: Right atrial size was severely dilated. Mitral Valve: The mitral valve is normal in structure. There is mild calcification of the mitral valve leaflet(s). No evidence of mitral valve stenosis. Tricuspid Valve: The tricuspid valve is abnormal. Tricuspid valve regurgitation is moderate to severe. Aortic Valve: The aortic valve is tricuspid. There is mild calcification of the aortic valve. Aortic valve regurgitation is not visualized. No aortic stenosis is present. Pulmonic Valve: The pulmonic valve was normal in structure. Pulmonic valve regurgitation is moderate. Aorta: The aortic arch was not well visualized. Venous: The inferior vena cava is normal in size with greater than 50% respiratory variability, suggesting right atrial pressure of 3 mmHg. LEFT VENTRICLE PLAX 2D LVOT diam:     1.80 cm LVOT Area:     2.54 cm  LEFT ATRIUM             Index        RIGHT ATRIUM           Index LA Vol (A2C):   44.4 ml 31.21 ml/m  RA Area:     27.90 cm LA Vol (A4C):   40.3 ml 28.32 ml/m  RA Volume:   105.00 ml 73.80 ml/m LA  Biplane Vol: 42.7 ml 30.01 ml/m   AORTA Ao Root diam: 2.90 cm TRICUSPID VALVE TR Peak grad:   40.7 mmHg TR Vmax:        319.00 cm/s  SHUNTS Systemic Diam: 1.80 cm Dalton McleanMD Electronically signed by Wilfred Lacy Signature Date/Time: 03/26/2023/2:49:52 PM    Final    DG Chest Portable 1 View Result Date: 03/24/2023 CLINICAL DATA:  CHF EXAM: PORTABLE CHEST 1 VIEW COMPARISON:  01/31/2023, CT chest 03/30/2022., chest x-ray 12/27/2022 FINDINGS: Mild cardiomegaly. Moderate right and small left pleural effusions. Underlying emphysema and chronic appearing interstitial opacities. Right greater than left basilar airspace disease. Aortic atherosclerosis. IMPRESSION: 1. Mild cardiomegaly. Moderate right and small left pleural effusions with right greater than left basilar airspace disease, atelectasis versus pneumonia. 2. Underlying emphysema and chronic appearing interstitial opacities. Electronically Signed   By: Jasmine Pang M.D.   On: 03/24/2023 15:45    Microbiology: No results found for this or any previous visit (from the past 240 hours).   Labs: Basic Metabolic Panel: Recent Labs  Lab 03/27/23 0306 03/28/23 0307 03/29/23 0244 03/30/23 0312 03/30/23 1808 03/31/23 0239 04/01/23 0226  NA 131*   < > 130* 133* 134* 133* 135  K 4.7   < > 4.8 5.2* 4.4 4.3 4.5  CL 97*   < > 95* 96* 95* 95* 97*  CO2 25   < > 27 30 26 29 28   GLUCOSE 111*   < >  89 98 113* 96 108*  BUN 15   < > 18 22 23  25* 28*  CREATININE 0.98   < > 1.07* 1.15* 1.11* 1.11* 1.14*  CALCIUM 7.4*   < > 7.3* 7.7* 7.9* 8.0* 8.2*  MG 2.2  --   --   --   --   --   --    < > = values in this interval not displayed.   Liver Function Tests: No results for input(s): "AST", "ALT", "ALKPHOS", "BILITOT", "PROT", "ALBUMIN" in the last 168 hours. No results for input(s): "LIPASE", "AMYLASE" in the last 168 hours. No results for input(s): "AMMONIA" in the last 168 hours. CBC: Recent Labs  Lab 03/29/23 0244  WBC 11.8*  HGB 10.8*   HCT 32.4*  MCV 86.4  PLT 364   Cardiac Enzymes: No results for input(s): "CKTOTAL", "CKMB", "CKMBINDEX", "TROPONINI" in the last 168 hours. BNP: BNP (last 3 results) Recent Labs    01/30/23 1743 02/01/23 0430 03/24/23 1352  BNP 814.7* 327.6* 497.7*    ProBNP (last 3 results) No results for input(s): "PROBNP" in the last 8760 hours.  CBG: No results for input(s): "GLUCAP" in the last 168 hours.     Signed:  Zannie Cove MD.  [facility] 04/02/2023, 11:52 AM

## 2023-04-02 NOTE — TOC Transition Note (Addendum)
Transition of Care [facility]) - Discharge Note   Patient Details  Name: Laurie James MRN: [id number] Date of Birth: May 05, 1945  [facility] CM/SW Contact:  Ronny Bacon, RN Phone Number: 04/02/2023, 8:58 AM   Clinical Narrative:   Secure message received regarding patient being discharged home today with home health services. Patient currently active with [facility]. Spoke with Kandee Keen with [facility], Aware orders are in for [facility] PT,RN,OT,SW, and Aide.   1144: PTAR notified    Final next level of care: Home w Home Health Services Barriers to Discharge: No Barriers Identified   Patient Goals and CMS Choice Patient states their goals for this hospitalization and ongoing recovery are:: wants to go home with home health services   Choice offered to / list presented to : Patient, Adult Children      Discharge Placement                       Discharge Plan and Services Additional resources added to the After Visit Summary for     Discharge Planning Services: CM Consult Post Acute Care Choice: NA          DME Arranged: N/A DME Agency: NA       HH Arranged: PT, OT, RN, Social Work [employer] Agency: [facility] (Patient is currently using their services.) Date HH Agency Contacted: 04/02/23 Time HH Agency Contacted: [phone] Representative spoke with at [facility] Agency: Kandee Keen  Social Drivers of Health (SDOH) Interventions SDOH Screenings   Food Insecurity: No Food Insecurity (03/25/2023)  Housing: Low Risk  (03/25/2023)  Transportation Needs: No Transportation Needs (03/25/2023)  Utilities: Not At Risk (03/25/2023)  Tobacco Use: Medium Risk (03/27/2023)     Readmission Risk Interventions    01/31/2023    3:16 PM 09/07/2022    3:25 PM  Readmission Risk Prevention Plan  Transportation Screening Complete Complete  PCP or Specialist Appt within 5-7 Days  Complete  PCP or Specialist Appt within 3-5 Days Complete   Home Care Screening  Complete   Medication Review (RN CM)  Complete  HRI or Home Care Consult Complete   Palliative Care Screening Not Applicable   Medication Review (RN Care Manager) Complete

## 2023-04-02 NOTE — Progress Notes (Signed)
Mobility Specialist Progress Note:    04/02/23 1157  Mobility  Activity Turned to right side;Turned to left side  Level of Assistance Minimal assist, patient does 75% or more  Activity Response Tolerated well  Mobility visit 1 Mobility  Mobility Specialist Start Time (ACUTE ONLY) 1108  Mobility Specialist Stop Time (ACUTE ONLY) 1119  Mobility Specialist Time Calculation (min) (ACUTE ONLY) 11 min   Pt received in bed RN requesting assistance repositioning and cleaning pt. Pt was able to follow verbal cues w/o fault. Needed MinA to turn left and right. Situated back in bed. Call bell and personal belongings in reach. All needs met.  Thompson Grayer Mobility Specialist  Please contact vis Secure Chat or  Rehab Office [phone]

## 2023-04-03 ENCOUNTER — Other Ambulatory Visit (HOSPITAL_COMMUNITY): Payer: Self-pay

## 2023-04-12 DIAGNOSIS — I5032 Chronic diastolic (congestive) heart failure: Secondary | ICD-10-CM | POA: Diagnosis not present

## 2023-04-13 ENCOUNTER — Encounter (HOSPITAL_COMMUNITY): Payer: Medicare HMO

## 2023-04-17 ENCOUNTER — Other Ambulatory Visit (HOSPITAL_COMMUNITY): Payer: Self-pay

## 2023-04-23 DIAGNOSIS — J9601 Acute respiratory failure with hypoxia: Secondary | ICD-10-CM | POA: Diagnosis not present

## 2023-04-23 DIAGNOSIS — I2781 Cor pulmonale (chronic): Secondary | ICD-10-CM | POA: Diagnosis not present

## 2023-04-23 DIAGNOSIS — I270 Primary pulmonary hypertension: Secondary | ICD-10-CM | POA: Diagnosis not present

## 2023-04-23 DIAGNOSIS — F331 Major depressive disorder, recurrent, moderate: Secondary | ICD-10-CM | POA: Diagnosis not present

## 2023-04-23 DIAGNOSIS — I5033 Acute on chronic diastolic (congestive) heart failure: Secondary | ICD-10-CM | POA: Diagnosis not present

## 2023-04-23 DIAGNOSIS — B370 Candidal stomatitis: Secondary | ICD-10-CM | POA: Diagnosis not present

## 2023-04-23 DIAGNOSIS — R627 Adult failure to thrive: Secondary | ICD-10-CM | POA: Diagnosis not present

## 2023-04-23 DIAGNOSIS — S91001A Unspecified open wound, right ankle, initial encounter: Secondary | ICD-10-CM | POA: Diagnosis not present

## 2023-04-23 DIAGNOSIS — N1831 Chronic kidney disease, stage 3a: Secondary | ICD-10-CM | POA: Diagnosis not present

## 2023-04-25 ENCOUNTER — Encounter (HOSPITAL_COMMUNITY): Payer: Medicare HMO

## 2023-05-29 DIAGNOSIS — L8995 Pressure ulcer of unspecified site, unstageable: Secondary | ICD-10-CM | POA: Diagnosis not present

## 2023-05-29 DIAGNOSIS — Z743 Need for continuous supervision: Secondary | ICD-10-CM | POA: Diagnosis not present

## 2023-07-05 ENCOUNTER — Telehealth (HOSPITAL_COMMUNITY): Payer: Self-pay | Admitting: Internal Medicine

## 2023-07-11 DIAGNOSIS — R531 Weakness: Secondary | ICD-10-CM | POA: Diagnosis not present

## 2023-07-11 DIAGNOSIS — Z743 Need for continuous supervision: Secondary | ICD-10-CM | POA: Diagnosis not present

## 2023-08-17 ENCOUNTER — Other Ambulatory Visit: Payer: Self-pay

## 2023-08-17 ENCOUNTER — Inpatient Hospital Stay (HOSPITAL_COMMUNITY)
Admission: EM | Admit: 2023-08-17 | Discharge: 2023-08-21 | DRG: 291 | Disposition: A | Discharge status: Hospice | Attending: Internal Medicine | Admitting: Internal Medicine

## 2023-08-17 ENCOUNTER — Emergency Department (HOSPITAL_COMMUNITY)

## 2023-08-17 ENCOUNTER — Encounter (HOSPITAL_COMMUNITY): Payer: Self-pay

## 2023-08-17 DIAGNOSIS — I272 Pulmonary hypertension, unspecified: Secondary | ICD-10-CM | POA: Diagnosis present

## 2023-08-17 DIAGNOSIS — Z8701 Personal history of pneumonia (recurrent): Secondary | ICD-10-CM

## 2023-08-17 DIAGNOSIS — R112 Nausea with vomiting, unspecified: Secondary | ICD-10-CM | POA: Diagnosis present

## 2023-08-17 DIAGNOSIS — Z91138 Patient's unintentional underdosing of medication regimen for other reason: Secondary | ICD-10-CM

## 2023-08-17 DIAGNOSIS — Z743 Need for continuous supervision: Secondary | ICD-10-CM | POA: Diagnosis not present

## 2023-08-17 DIAGNOSIS — I110 Hypertensive heart disease with heart failure: Secondary | ICD-10-CM | POA: Diagnosis not present

## 2023-08-17 DIAGNOSIS — M810 Age-related osteoporosis without current pathological fracture: Secondary | ICD-10-CM | POA: Diagnosis present

## 2023-08-17 DIAGNOSIS — Z7983 Long term (current) use of bisphosphonates: Secondary | ICD-10-CM

## 2023-08-17 DIAGNOSIS — J9621 Acute and chronic respiratory failure with hypoxia: Secondary | ICD-10-CM | POA: Diagnosis present

## 2023-08-17 DIAGNOSIS — E039 Hypothyroidism, unspecified: Secondary | ICD-10-CM | POA: Diagnosis present

## 2023-08-17 DIAGNOSIS — I5032 Chronic diastolic (congestive) heart failure: Secondary | ICD-10-CM | POA: Diagnosis present

## 2023-08-17 DIAGNOSIS — I509 Heart failure, unspecified: Secondary | ICD-10-CM | POA: Diagnosis not present

## 2023-08-17 DIAGNOSIS — G3184 Mild cognitive impairment, so stated: Secondary | ICD-10-CM | POA: Diagnosis present

## 2023-08-17 DIAGNOSIS — M109 Gout, unspecified: Secondary | ICD-10-CM | POA: Diagnosis present

## 2023-08-17 DIAGNOSIS — T502X6A Underdosing of carbonic-anhydrase inhibitors, benzothiadiazides and other diuretics, initial encounter: Secondary | ICD-10-CM | POA: Diagnosis present

## 2023-08-17 DIAGNOSIS — Z87891 Personal history of nicotine dependence: Secondary | ICD-10-CM

## 2023-08-17 DIAGNOSIS — G4733 Obstructive sleep apnea (adult) (pediatric): Secondary | ICD-10-CM | POA: Diagnosis not present

## 2023-08-17 DIAGNOSIS — Z885 Allergy status to narcotic agent status: Secondary | ICD-10-CM

## 2023-08-17 DIAGNOSIS — I5022 Chronic systolic (congestive) heart failure: Secondary | ICD-10-CM | POA: Diagnosis present

## 2023-08-17 DIAGNOSIS — D72828 Other elevated white blood cell count: Secondary | ICD-10-CM | POA: Diagnosis present

## 2023-08-17 DIAGNOSIS — I10 Essential (primary) hypertension: Secondary | ICD-10-CM | POA: Diagnosis not present

## 2023-08-17 DIAGNOSIS — E43 Unspecified severe protein-calorie malnutrition: Secondary | ICD-10-CM | POA: Diagnosis present

## 2023-08-17 DIAGNOSIS — Z90710 Acquired absence of both cervix and uterus: Secondary | ICD-10-CM

## 2023-08-17 DIAGNOSIS — J9601 Acute respiratory failure with hypoxia: Secondary | ICD-10-CM | POA: Diagnosis present

## 2023-08-17 DIAGNOSIS — I071 Rheumatic tricuspid insufficiency: Secondary | ICD-10-CM | POA: Diagnosis present

## 2023-08-17 DIAGNOSIS — Z7901 Long term (current) use of anticoagulants: Secondary | ICD-10-CM | POA: Diagnosis not present

## 2023-08-17 DIAGNOSIS — E785 Hyperlipidemia, unspecified: Secondary | ICD-10-CM | POA: Diagnosis present

## 2023-08-17 DIAGNOSIS — Z91148 Patient's other noncompliance with medication regimen for other reason: Secondary | ICD-10-CM | POA: Diagnosis not present

## 2023-08-17 DIAGNOSIS — I2510 Atherosclerotic heart disease of native coronary artery without angina pectoris: Secondary | ICD-10-CM | POA: Diagnosis present

## 2023-08-17 DIAGNOSIS — E871 Hypo-osmolality and hyponatremia: Secondary | ICD-10-CM | POA: Diagnosis present

## 2023-08-17 DIAGNOSIS — J441 Chronic obstructive pulmonary disease with (acute) exacerbation: Secondary | ICD-10-CM | POA: Diagnosis present

## 2023-08-17 DIAGNOSIS — Z7401 Bed confinement status: Secondary | ICD-10-CM

## 2023-08-17 DIAGNOSIS — J449 Chronic obstructive pulmonary disease, unspecified: Secondary | ICD-10-CM | POA: Diagnosis present

## 2023-08-17 DIAGNOSIS — N1831 Chronic kidney disease, stage 3a: Secondary | ICD-10-CM | POA: Diagnosis present

## 2023-08-17 DIAGNOSIS — I5043 Acute on chronic combined systolic (congestive) and diastolic (congestive) heart failure: Secondary | ICD-10-CM | POA: Diagnosis present

## 2023-08-17 DIAGNOSIS — I480 Paroxysmal atrial fibrillation: Secondary | ICD-10-CM | POA: Diagnosis present

## 2023-08-17 DIAGNOSIS — J849 Interstitial pulmonary disease, unspecified: Secondary | ICD-10-CM | POA: Diagnosis present

## 2023-08-17 DIAGNOSIS — R627 Adult failure to thrive: Secondary | ICD-10-CM | POA: Diagnosis present

## 2023-08-17 DIAGNOSIS — K219 Gastro-esophageal reflux disease without esophagitis: Secondary | ICD-10-CM | POA: Diagnosis present

## 2023-08-17 DIAGNOSIS — Z79899 Other long term (current) drug therapy: Secondary | ICD-10-CM

## 2023-08-17 DIAGNOSIS — Z66 Do not resuscitate: Secondary | ICD-10-CM | POA: Diagnosis present

## 2023-08-17 DIAGNOSIS — J90 Pleural effusion, not elsewhere classified: Secondary | ICD-10-CM | POA: Diagnosis not present

## 2023-08-17 DIAGNOSIS — Z6822 Body mass index (BMI) 22.0-22.9, adult: Secondary | ICD-10-CM

## 2023-08-17 DIAGNOSIS — Z803 Family history of malignant neoplasm of breast: Secondary | ICD-10-CM

## 2023-08-17 DIAGNOSIS — R918 Other nonspecific abnormal finding of lung field: Secondary | ICD-10-CM | POA: Diagnosis not present

## 2023-08-17 DIAGNOSIS — J410 Simple chronic bronchitis: Secondary | ICD-10-CM

## 2023-08-17 DIAGNOSIS — R0602 Shortness of breath: Secondary | ICD-10-CM | POA: Diagnosis not present

## 2023-08-17 DIAGNOSIS — Z7951 Long term (current) use of inhaled steroids: Secondary | ICD-10-CM

## 2023-08-17 DIAGNOSIS — R0902 Hypoxemia: Secondary | ICD-10-CM | POA: Diagnosis not present

## 2023-08-17 DIAGNOSIS — R609 Edema, unspecified: Secondary | ICD-10-CM | POA: Diagnosis not present

## 2023-08-17 DIAGNOSIS — Z79890 Hormone replacement therapy (postmenopausal): Secondary | ICD-10-CM

## 2023-08-17 DIAGNOSIS — J984 Other disorders of lung: Secondary | ICD-10-CM | POA: Diagnosis not present

## 2023-08-17 LAB — BASIC METABOLIC PANEL WITH GFR
Anion gap: 12 (ref 5–15)
Anion gap: 11 (ref 5–15)
BUN: 12 mg/dL (ref 8–23)
BUN: 13 mg/dL (ref 8–23)
CO2: 23 mmol/L (ref 22–32)
CO2: 26 mmol/L (ref 22–32)
Calcium: 7.9 mg/dL — ABNORMAL LOW (ref 8.9–10.3)
Calcium: 8.1 mg/dL — ABNORMAL LOW (ref 8.9–10.3)
Chloride: 90 mmol/L — ABNORMAL LOW (ref 98–111)
Chloride: 89 mmol/L — ABNORMAL LOW (ref 98–111)
Creatinine, Ser: 0.78 mg/dL (ref 0.44–1.00)
Creatinine, Ser: 0.8 mg/dL (ref 0.44–1.00)
GFR, Estimated: >60 mL/min (ref >60)
GFR, Estimated: >60 mL/min (ref >60)
Glucose, Bld: 134 mg/dL — ABNORMAL HIGH (ref 70–99)
Glucose, Bld: 120 mg/dL — ABNORMAL HIGH (ref 70–99)
Potassium: 3.6 mmol/L (ref 3.5–5.1)
Potassium: 3.6 mmol/L (ref 3.5–5.1)
Sodium: 125 mmol/L — ABNORMAL LOW (ref 135–145)
Sodium: 126 mmol/L — ABNORMAL LOW (ref 135–145)

## 2023-08-17 LAB — CBC WITH DIFFERENTIAL/PLATELET
Abs Immature Granulocytes: 0.11 10*3/uL — ABNORMAL HIGH (ref 0.00–0.07)
Basophils Absolute: 0 10*3/uL (ref 0.0–0.1)
Basophils Relative: 0 %
Eosinophils Absolute: 0.1 10*3/uL (ref 0.0–0.5)
Eosinophils Relative: 1 %
HCT: 37.2 % (ref 36.0–46.0)
Hemoglobin: 13 g/dL (ref 12.0–15.0)
Immature Granulocytes: 1 %
Lymphocytes Relative: 4 %
Lymphs Abs: 0.5 10*3/uL — ABNORMAL LOW (ref 0.7–4.0)
MCH: 33.4 pg (ref 26.0–34.0)
MCHC: 34.9 g/dL (ref 30.0–36.0)
MCV: 95.6 fL (ref 80.0–100.0)
Monocytes Absolute: 0.5 10*3/uL (ref 0.1–1.0)
Monocytes Relative: 4 %
Neutro Abs: 11.7 10*3/uL — ABNORMAL HIGH (ref 1.7–7.7)
Neutrophils Relative %: 90 %
Platelets: 358 10*3/uL (ref 150–400)
RBC: 3.89 MIL/uL (ref 3.87–5.11)
RDW: 15.8 % — ABNORMAL HIGH (ref 11.5–15.5)
WBC: 12.9 10*3/uL — ABNORMAL HIGH (ref 4.0–10.5)
nRBC: 0 % (ref 0.0–0.2)

## 2023-08-17 LAB — MAGNESIUM: Magnesium: 1.7 mg/dL (ref 1.7–2.4)

## 2023-08-17 LAB — COMPREHENSIVE METABOLIC PANEL WITH GFR
ALT: 87 U/L — ABNORMAL HIGH (ref 0–44)
AST: 90 U/L — ABNORMAL HIGH (ref 15–41)
Albumin: 2.5 g/dL — ABNORMAL LOW (ref 3.5–5.0)
Alkaline Phosphatase: 64 U/L (ref 38–126)
Anion gap: 13 (ref 5–15)
BUN: 13 mg/dL (ref 8–23)
CO2: 25 mmol/L (ref 22–32)
Calcium: 8.3 mg/dL — ABNORMAL LOW (ref 8.9–10.3)
Chloride: 89 mmol/L — ABNORMAL LOW (ref 98–111)
Creatinine, Ser: 0.78 mg/dL (ref 0.44–1.00)
GFR, Estimated: >60 mL/min (ref >60)
Glucose, Bld: 102 mg/dL — ABNORMAL HIGH (ref 70–99)
Potassium: 3.7 mmol/L (ref 3.5–5.1)
Sodium: 127 mmol/L — ABNORMAL LOW (ref 135–145)
Total Bilirubin: 1.9 mg/dL — ABNORMAL HIGH (ref 0.0–1.2)
Total Protein: 7.3 g/dL (ref 6.5–8.1)

## 2023-08-17 LAB — TROPONIN I (HIGH SENSITIVITY): Troponin I (High Sensitivity): 32 ng/L — ABNORMAL HIGH (ref <18)

## 2023-08-17 LAB — BRAIN NATRIURETIC PEPTIDE: B Natriuretic Peptide: 914.7 pg/mL — ABNORMAL HIGH (ref 0.0–100.0)

## 2023-08-17 MED ORDER — ATORVASTATIN CALCIUM 40 MG PO TABS
40.0000 mg | ORAL_TABLET | Freq: Every day | ORAL | Status: DC
  Administered 2023-08-18 – 2023-08-20 (×3): 40 mg via ORAL
  Filled 2023-08-17 (×4): qty 1

## 2023-08-17 MED ORDER — AMIODARONE HCL 200 MG PO TABS
200.0000 mg | ORAL_TABLET | Freq: Every day | ORAL | Status: DC
  Administered 2023-08-18 – 2023-08-20 (×3): 200 mg via ORAL
  Filled 2023-08-17 (×4): qty 1

## 2023-08-17 MED ORDER — METHYLPREDNISOLONE SODIUM SUCC 125 MG IJ SOLR
125.0000 mg | Freq: Once | INTRAMUSCULAR | Status: AC
  Administered 2023-08-17: 125 mg via INTRAVENOUS
  Filled 2023-08-17: qty 2

## 2023-08-17 MED ORDER — LEVOTHYROXINE SODIUM 75 MCG PO TABS
75.0000 ug | ORAL_TABLET | Freq: Every day | ORAL | Status: DC
Start: 2023-08-18 — End: 2023-08-21
  Administered 2023-08-18 – 2023-08-21 (×4): 75 ug via ORAL
  Filled 2023-08-17 (×5): qty 1

## 2023-08-17 MED ORDER — FUROSEMIDE 10 MG/ML IJ SOLN
40.0000 mg | Freq: Once | INTRAMUSCULAR | Status: AC
  Administered 2023-08-17: 40 mg via INTRAVENOUS
  Filled 2023-08-17: qty 4

## 2023-08-17 MED ORDER — POLYETHYLENE GLYCOL 3350 17 G PO PACK: 17.0000 g | PACK | Freq: Every day | ORAL | Status: DC | PRN

## 2023-08-17 MED ORDER — APIXABAN 2.5 MG PO TABS
2.5000 mg | ORAL_TABLET | Freq: Two times a day (BID) | ORAL | Status: DC
  Administered 2023-08-17 – 2023-08-18 (×2): 2.5 mg via ORAL
  Filled 2023-08-17 (×2): qty 1

## 2023-08-17 MED ORDER — ONDANSETRON HCL 4 MG/2ML IJ SOLN
4.0000 mg | Freq: Once | INTRAMUSCULAR | Status: AC
  Administered 2023-08-17: 4 mg via INTRAVENOUS
  Filled 2023-08-17: qty 2

## 2023-08-17 MED ORDER — IPRATROPIUM-ALBUTEROL 0.5-2.5 (3) MG/3ML IN SOLN
3.0000 mL | Freq: Once | RESPIRATORY_TRACT | Status: AC
  Administered 2023-08-17: 3 mL via RESPIRATORY_TRACT
  Filled 2023-08-17: qty 3

## 2023-08-17 MED ORDER — ALBUTEROL SULFATE (2.5 MG/3ML) 0.083% IN NEBU
3.0000 mL | INHALATION_SOLUTION | RESPIRATORY_TRACT | Status: DC | PRN
  Administered 2023-08-21: 3 mL via RESPIRATORY_TRACT
  Filled 2023-08-17: qty 3

## 2023-08-17 MED ORDER — ACETAMINOPHEN 650 MG RE SUPP: 650.0000 mg | Freq: Four times a day (QID) | RECTAL | Status: DC | PRN

## 2023-08-17 MED ORDER — FLUTICASONE FUROATE-VILANTEROL 200-25 MCG/ACT IN AEPB
1.0000 | INHALATION_SPRAY | Freq: Every day | RESPIRATORY_TRACT | Status: DC
  Administered 2023-08-18 – 2023-08-21 (×4): 1 via RESPIRATORY_TRACT
  Filled 2023-08-17: qty 28

## 2023-08-17 MED ORDER — ACETAMINOPHEN 325 MG PO TABS
650.0000 mg | ORAL_TABLET | Freq: Four times a day (QID) | ORAL | Status: DC | PRN
  Filled 2023-08-17: qty 2

## 2023-08-17 MED ORDER — FUROSEMIDE 10 MG/ML IJ SOLN
40.0000 mg | Freq: Two times a day (BID) | INTRAMUSCULAR | Status: DC
Start: 2023-08-17 — End: 2023-08-19
  Administered 2023-08-17 – 2023-08-19 (×4): 40 mg via INTRAVENOUS
  Filled 2023-08-17 (×4): qty 4

## 2023-08-17 MED ORDER — PANTOPRAZOLE SODIUM 40 MG PO TBEC
40.0000 mg | DELAYED_RELEASE_TABLET | Freq: Two times a day (BID) | ORAL | Status: DC
  Administered 2023-08-18 – 2023-08-20 (×6): 40 mg via ORAL
  Filled 2023-08-17 (×7): qty 1

## 2023-08-17 MED ORDER — MOMETASONE FURO-FORMOTEROL FUM 100-5 MCG/ACT IN AERO: 2.0000 | INHALATION_SPRAY | Freq: Two times a day (BID) | RESPIRATORY_TRACT | Status: DC

## 2023-08-17 MED ORDER — SPIRONOLACTONE 12.5 MG HALF TABLET
12.5000 mg | ORAL_TABLET | Freq: Every day | ORAL | Status: DC
  Administered 2023-08-18: 12.5 mg via ORAL
  Filled 2023-08-17: qty 1

## 2023-08-17 MED ORDER — SODIUM CHLORIDE 0.9% FLUSH
3.0000 mL | Freq: Two times a day (BID) | INTRAVENOUS | Status: DC
  Administered 2023-08-17 – 2023-08-20 (×7): 3 mL via INTRAVENOUS

## 2023-08-17 NOTE — Plan of Care (Signed)
  Problem: Education: Goal: Knowledge of General Education information will improve Description: Including pain rating scale, medication(s)/side effects and non-pharmacologic comfort measures Outcome: Progressing   Problem: Clinical Measurements: Goal: Will remain free from infection Outcome: Progressing   Problem: Pain Managment: Goal: General experience of comfort will improve and/or be controlled Outcome: Progressing

## 2023-08-17 NOTE — H&P (Signed)
 History and Physical   Laurie James ZOX:[id number] DOB: February 28, 1946 DOA: 08/17/2023  PCP: Jearldine Mina, MD   Patient coming from: Home  Chief Complaint: Shortness of breath  HPI: Laurie James is a 78 y.o. female with medical history significant of atrial fibrillation, hyperlipidemia, hypothyroidism, GERD, gout, CKD 3A, CAD, chronic combined systolic and diastolic CHF, tricuspid regurgitation, pulmonary hypertension, pleural effusion, COPD, macular degeneration, mild cognitive impairment, OSA presenting with worsening shortness of breath.  Patient reports some worsening shortness of breath over the past several days.  Does have some progressive edema but she is not sure if it has gotten worse in the last few days.  However, she has not been taking her diuretic for the last several days due to some nausea with vomiting.  She does follow with palliative care/hospice but had discussions with them prior to coming to the ED and decided that she would like her current issue to be evaluated and trial of treatment.  She denies fevers, chills, chest pain, abdominal pain, constipation, diarrhea.  ED Course: Vital signs in the ED notable for heart rate in the 60s-100s, respiratory rate in the teens-20s, requiring 2 L to maintain saturations.  Lab workup included CMP with sodium 127, chloride 89, glucose 107, calcium  8.3, albumin  2.5, AST 90, ALT 87, T. bili 1.9.  CBC with leukocytosis 12.9.  Troponin mildly elevated at 32.  BNP elevated to 914.  Chest x-ray with the redemonstrated extensive interstitial markings favoring ILD with small to moderate right pleural effusion which is stable.    Patient received Lasix , Solu-Medrol , DuoNeb, Zofran  in the ED.  With some improvement, but still requiring oxygen.  Review of Systems: As per HPI otherwise all other systems reviewed and are negative.  Past Medical History:  Diagnosis Date   AMD (age related macular degeneration)     Community acquired pneumonia 12/27/2022   COPD (chronic obstructive pulmonary disease) (HCC)    Depression    Hiatal hernia    Hip fracture (HCC) 01/31/2023   Intermittent low back pain    Memory impairment    Midsternal chest pain    a. 12/2011 Cardiac CTA Ca++ score of 103.3 (80th %), LAD <50p/m, RCA 50-75.   Osteoporosis    PAF (paroxysmal atrial fibrillation) (HCC) 09/2020   Paroxysmal atrial fibrillation with RVR (HCC) 11/28/2021   Vitamin B 12 deficiency     Past Surgical History:  Procedure Laterality Date   ABDOMINAL HYSTERECTOMY     ATRIAL FIBRILLATION ABLATION N/A 03/08/2022   Procedure: ATRIAL FIBRILLATION ABLATION;  Surgeon: Efraim Grange, MD;  Location: [facility];  Service: Cardiovascular;  Laterality: N/A;   INTRAMEDULLARY (IM) NAIL INTERTROCHANTERIC Right 02/01/2023   Procedure: INTRAMEDULLARY (IM) NAIL INTERTROCHANTERIC;  Surgeon: Donnamarie Gables, MD;  Location: [facility];  Service: Orthopedics;  Laterality: Right;   IR THORACENTESIS ASP PLEURAL SPACE W/IMG GUIDE  10/20/2020   IR THORACENTESIS ASP PLEURAL SPACE W/IMG GUIDE  05/02/2022   IR THORACENTESIS ASP PLEURAL SPACE W/IMG GUIDE  09/07/2022   IR THORACENTESIS ASP PLEURAL SPACE W/IMG GUIDE  12/27/2022   RIGHT HEART CATH N/A 09/06/2022   Procedure: RIGHT HEART CATH;  Surgeon: Alwin Baars, DO;  Location: [facility];  Service: Cardiovascular;  Laterality: N/A;   RIGHT/LEFT HEART CATH AND CORONARY ANGIOGRAPHY N/A 10/07/2020   Procedure: RIGHT/LEFT HEART CATH AND CORONARY ANGIOGRAPHY;  Surgeon: Avanell Leigh, MD;  Location: [facility];  Service: Cardiovascular;  Laterality: N/A;  TEE WITHOUT CARDIOVERSION N/A 10/25/2020   Procedure: TRANSESOPHAGEAL ECHOCARDIOGRAM (TEE);  Surgeon: Jacqueline Matsu, MD;  Location: [facility] ENDOSCOPY;  Service: Cardiovascular;  Laterality: N/A;    Social History  reports that she quit smoking about 2 years ago. Her smoking use included cigarettes. She started  smoking about 67 years ago. She has a 65 pack-year smoking history. She has never used smokeless tobacco. She reports that she does not drink alcohol and does not use drugs.  Allergies  Allergen Reactions   Codeine Nausea And Vomiting and Other (See Comments)    Upsets the stomach    Family History  Problem Relation Age of Onset   Breast cancer Sister    Breast cancer Sister   Reviewed on admission  Prior to Admission medications   Medication Sig Start Date End Date Taking? Authorizing Provider  albuterol  (VENTOLIN  HFA) 108 (90 Base) MCG/ACT inhaler Inhale 2 puffs into the lungs every 4 (four) hours as needed for wheezing. 06/08/20   [provider]  alendronate (FOSAMAX) 70 MG tablet Take 70 mg by mouth once a week. 03/01/23   [provider]  amiodarone  (PACERONE ) 200 MG tablet Take 1 tablet (200 mg total) by mouth daily. 04/02/23 02/21/24  Deforest Fast, MD  apixaban  (ELIQUIS ) 2.5 MG TABS tablet Take 1 tablet by mouth twice daily 01/30/23   Bensimhon, Daniel R, MD  atorvastatin  (LIPITOR) 40 MG tablet Stop taking until you see your PCP and obtain repeat labwork. Patient taking differently: Take 40 mg by mouth daily. Stop taking until you see your PCP and obtain repeat labwork. 01/31/23   Verlyn Goad, MD  bisacodyl  (DULCOLAX) 10 MG suppository Place 10 mg rectally 2 (two) times daily as needed. 08/09/23   [provider]  budesonide -formoterol  (SYMBICORT ) 80-4.5 MCG/ACT inhaler Inhale 2 puffs into the lungs in the morning and at bedtime. 10/30/22   Ezenduka, Nkeiruka J, MD  cyanocobalamin  (,VITAMIN B-12,) 1000 MCG/ML injection Inject 1,000 mcg into the muscle every 30 (thirty) days. 06/11/16   [provider]  ipratropium-albuterol  (DUONEB) 0.5-2.5 (3) MG/3ML SOLN Take 3 mLs by nebulization 4 (four) times daily as needed. 07/26/23   [provider]  nitroGLYCERIN  (NITROSTAT ) 0.4 MG SL tablet Place 1 tablet (0.4 mg total) under the tongue every 5  (five) minutes x 3 doses as needed for chest pain. 10/11/20   McDaniel, Jill D, NP  ondansetron  (ZOFRAN ) 4 MG tablet Take 4 mg by mouth 3 (three) times daily as needed for nausea or vomiting. 11/22/20   [provider]  pantoprazole  (PROTONIX ) 40 MG tablet Take 1 tablet (40 mg total) by mouth 2 (two) times daily. 09/05/22   Bensimhon, Daniel R, MD  senna (SENOKOT) 8.6 MG TABS tablet Take 2 tablets by mouth 2 (two) times daily as needed. 08/09/23   [provider]  spironolactone  (ALDACTONE ) 25 MG tablet Take 0.5 tablets (12.5 mg total) by mouth daily. 04/03/23   Deforest Fast, MD  SYNTHROID  75 MCG tablet Take 75 mcg by mouth daily before breakfast. 10/18/22   [provider]  torsemide  (DEMADEX ) 20 MG tablet Take 1 tablet (20 mg total) by mouth daily. 04/03/23   Deforest Fast, MD    Physical Exam: Vitals:   08/17/23 1315 08/17/23 1330 08/17/23 1345 08/17/23 1400  BP: 115/72 116/78 123/77 (!) 123/56  Pulse: 78 69 69 82  Resp: 20 (!) 22 (!) 22 (!) 26  Temp:      TempSrc:      SpO2: 95%  96% 100% 99%    Physical Exam Constitutional:      General: She is not in acute distress.    Appearance: Normal appearance. She is ill-appearing.  HENT:     Head: Normocephalic and atraumatic.     Mouth/Throat:     Mouth: Mucous membranes are moist.     Pharynx: Oropharynx is clear.  Eyes:     Extraocular Movements: Extraocular movements intact.     Pupils: Pupils are equal, round, and reactive to light.  Cardiovascular:     Rate and Rhythm: Normal rate and regular rhythm.     Pulses: Normal pulses.     Heart sounds: Normal heart sounds.  Pulmonary:     Effort: Pulmonary effort is normal. No respiratory distress.     Breath sounds: Decreased breath sounds and rales present.  Abdominal:     General: Bowel sounds are normal. There is no distension.     Palpations: Abdomen is soft.     Tenderness: There is no abdominal tenderness.  Musculoskeletal:        General: No  swelling or deformity.     Right lower leg: Edema (minimal) present.     Left lower leg: Edema (minimal) present.  Skin:    General: Skin is warm and dry.  Neurological:     General: No focal deficit present.     Mental Status: Mental status is at baseline.    Labs on Admission: I have personally reviewed following labs and imaging studies  CBC: Recent Labs  Lab 08/17/23 1014  WBC 12.9*  NEUTROABS 11.7*  HGB 13.0  HCT 37.2  MCV 95.6  PLT 358    Basic Metabolic Panel: Recent Labs  Lab 08/17/23 1014  NA 127*  K 3.7  CL 89*  CO2 25  GLUCOSE 102*  BUN 13  CREATININE 0.78  CALCIUM  8.3*    GFR: CrCl cannot be calculated (Unknown ideal weight.).  Liver Function Tests: Recent Labs  Lab 08/17/23 1014  AST 90*  ALT 87*  ALKPHOS 64  BILITOT 1.9*  PROT 7.3  ALBUMIN  2.5*    Urine analysis:    Component Value Date/Time   COLORURINE YELLOW 05/01/2022 1922   APPEARANCEUR HAZY (A) 05/01/2022 1922   LABSPEC 1.029 05/01/2022 1922   PHURINE 5.0 05/01/2022 1922   GLUCOSEU 50 (A) 05/01/2022 1922   HGBUR SMALL (A) 05/01/2022 1922   BILIRUBINUR NEGATIVE 05/01/2022 1922   KETONESUR NEGATIVE 05/01/2022 1922   PROTEINUR 100 (A) 05/01/2022 1922   NITRITE NEGATIVE 05/01/2022 1922   LEUKOCYTESUR NEGATIVE 05/01/2022 1922    Radiological Exams on Admission: DG Chest Port 1 View Result Date: 08/17/2023 CLINICAL DATA:  Shortness of breath. EXAM: PORTABLE CHEST 1 VIEW COMPARISON:  03/24/2023. FINDINGS: Redemonstration of extensive increased interstitial markings throughout bilateral lungs without significant interval change, favoring underlying chronic interstitial lung disease. There is small-to-moderate right pleural effusion, which appears increased since the prior study. There is probable trace left pleural effusion, grossly unchanged. No pneumothorax. Stable cardio-mediastinal silhouette. No acute osseous abnormalities. The soft tissues are within normal limits. IMPRESSION:  *Redemonstration of extensive increased interstitial markings throughout bilateral lungs without significant interval change, favoring underlying chronic interstitial lung disease. *There is small-to-moderate right pleural effusion, which appears increased since the prior study. There is probable trace left pleural effusion, grossly unchanged. Electronically Signed   By: Beula Brunswick M.D.   On: 08/17/2023 10:38   EKG: Independently reviewed.  Sinus rhythm at 83 beats minute.  Nonspecific T wave  changes.  Low voltage in multiple leads.  Mild baseline wander and baseline artifact.  QTc prolonged at 526.  Assessment/Plan Principal Problem:   Acute respiratory failure with hypoxia (HCC) Active Problems:   Chronic systolic CHF (congestive heart failure) (HCC)   Chronic diastolic CHF (congestive heart failure) (HCC)   Paroxysmal atrial fibrillation (HCC)   Chronic kidney disease, stage 3a (HCC)   COPD (chronic obstructive pulmonary disease) (HCC)   HLD (hyperlipidemia)   CAD (coronary artery disease)   MCI (mild cognitive impairment)   GERD without esophagitis   Hypothyroidism   Pulmonary HTN (HCC)   OSA (obstructive sleep apnea)   Gout   Acute on chronic respiratory failure with hypoxia Acute on chronic combined systolic diastolic CHF Pulmonary hypertension Tricuspid regurgitation > Last echo was in December 2024 with EF 50%, global hypokinesis, moderately reduced RV function, increased RV pressure, moderate to severe tricuspid regurgitation, evidence of pulmonary hypertension. > 3 days of worsening shortness of breath and persistent edema in in the setting of not taking diuretic for 3 days due to nausea and vomiting. > BNP 914, troponin 32.  Chest x-ray largely stable with stable right pleural effusion and extensive interstitial markings favoring ILD.  Concern for primarily right heart failure, does also have some mild LFT elevation. > Some improvement with diuretics in the ED but  continuing to require oxygen. > Follows with palliative/hospice but wants to attempt to address these issues with diuresis.  Does not want echocardiogram at this time can discuss further pending her response to treatment.  Says she follows with [facility], may need inpatient palliative consult. - Monitor on progressive unit overnight - Continue supplemental oxygen - Continue Lasix  40 mg IV twice daily - Continue home spironolactone  - Strict I's and O's, daily weights - Check magnesium  - Trend renal function and electrolytes - Supportive care  Hypervolemic Hyponatremia > Na 127 in the setting of above. > Has received a dose of Lasix  already in the ED - Recheck BMP and trend - Continue Lasix  as above  Leukocytosis Nausea and vomiting > Likely reactive, remains afebrile.  Does have some GI illness but no pain just nausea and vomiting.   > Treating nausea and vomiting symptomatically for now. If fails to improve may need further imaging - Antiemetics are limited due to prolonged QTc at-526, recheck EKG as below. - Trend fever curve and WBC  Atrial fibrillation - Continue home amiodarone  and Eliquis   Hyperlipidemia - Continue atorvastatin   Hypothyroidism - Continue home Synthroid   GERD - Continue home PPI  CKD 3A > Creatinine stable - Trend renal function and electrolytes  CAD - Continue Eliquis , atorvastatin   Mild cognitive impairment - Noted  OSA - Not on CPAP  COPD - Replace home Symbicort  formulary Dulera  - Continue as needed albuterol   QTc prolongation - Repeat EKG to determine what antibiotics we will be able to give. - Trend  DVT prophylaxis: Eliquis  Code Status:   30 Family Communication:  None on admission Disposition Plan:   Patient is from:  Home  Anticipated DC to:  Home  Anticipated DC date:  1 to 4 days  Anticipated DC barriers: None  Consults called:  None Admission status:  Observation, progressive  Severity of Illness: The  appropriate patient status for this patient is OBSERVATION. Observation status is judged to be reasonable and necessary in order to provide the required intensity of service to ensure the patient's safety. The patient's presenting symptoms, physical exam findings, and initial radiographic and laboratory  data in the context of their medical condition is felt to place them at decreased risk for further clinical deterioration. Furthermore, it is anticipated that the patient will be medically stable for discharge from the hospital within 2 midnights of admission.    Johnetta Nab MD [facility]  How to contact the [facility] Attending or Consulting provider 7A - 7P or covering provider during after hours 7P -7A, for this patient?   Check the care team in [facility] and look for a) attending/consulting [facility] provider listed and b) the [facility] team listed Log into [email or web address] and use [place]'s universal password to access. If you do not have the password, please contact the hospital operator. Locate the [facility] provider you are looking for under [facility] and page to a number that you can be directly reached. If you still have difficulty reaching the provider, please page the [facility] (Director on Call) for the Hospitalists listed on amion for assistance.  08/17/2023, 2:32 PM

## 2023-08-17 NOTE — Progress Notes (Signed)
 Laurie James ED [address] [facility] liaison note:     This patient is a current hospice patient with [facility], admitted with a terminal diagnosis of abnormal weight loss.     We will continue to follow for any discharge planning needs and to coordinate continuation of hospice care.   Please don't hesitate to call with any Hospice related questions or concerns.    Thank you for the opportunity to participate in this patient's care. Dwane Gitelman, Charity fundraiser, BSN [facility] Liaison [phone]

## 2023-08-17 NOTE — ED Triage Notes (Signed)
 Pt BIB [facility] from home d/t SOB that was noticed by her husband at 0830 with her spO2 in the "mid 80's" on RA. Husband called her hospice RN to come see her, they were advised to come into ED. EMS reports the husband seems to be in denial of her condition & states she has not been eating much as well lately. EMS placed 4L n/c & she was 96%, 78 bpm, 138/86, CBG 113, 98.1 temporal Temp.

## 2023-08-17 NOTE — ED Provider Notes (Signed)
 [place] EMERGENCY DEPARTMENT AT [facility] Provider Note   CSN: [id number] Arrival date & time: 08/17/23  1009     History  Chief Complaint  Patient presents with   Shortness of Breath    Laurie James is a 78 y.o. female.  78 yo F with a chief complaint of difficulty breathing.  Going on for a few days.  She is not sure exactly what is causing the problem but does feel like her legs are more swollen than they normally are.  Is also been going on for a few days.  She has not been able to take her fluid pills because she has had some nausea and vomiting.   Shortness of Breath      Home Medications Prior to Admission medications   Medication Sig Start Date End Date Taking? Authorizing Provider  albuterol  (VENTOLIN  HFA) 108 (90 Base) MCG/ACT inhaler Inhale 2 puffs into the lungs every 4 (four) hours as needed for wheezing. 06/08/20   [provider]  alendronate (FOSAMAX) 70 MG tablet Take 70 mg by mouth once a week. 03/01/23   [provider]  amiodarone  (PACERONE ) 200 MG tablet Take 1 tablet (200 mg total) by mouth daily. 04/02/23 02/21/24  Deforest Fast, MD  apixaban  (ELIQUIS ) 2.5 MG TABS tablet Take 1 tablet by mouth twice daily 01/30/23   Bensimhon, Daniel R, MD  atorvastatin  (LIPITOR) 40 MG tablet Stop taking until you see your PCP and obtain repeat labwork. Patient taking differently: Take 40 mg by mouth daily. Stop taking until you see your PCP and obtain repeat labwork. 01/31/23   Verlyn Goad, MD  bisacodyl  (DULCOLAX) 10 MG suppository Place 10 mg rectally 2 (two) times daily as needed. 08/09/23   [provider]  budesonide -formoterol  (SYMBICORT ) 80-4.5 MCG/ACT inhaler Inhale 2 puffs into the lungs in the morning and at bedtime. 10/30/22   Ezenduka, Nkeiruka J, MD  cyanocobalamin  (,VITAMIN B-12,) 1000 MCG/ML injection Inject 1,000 mcg into the muscle every 30 (thirty) days. 06/11/16   [provider]   ipratropium-albuterol  (DUONEB) 0.5-2.5 (3) MG/3ML SOLN Take 3 mLs by nebulization 4 (four) times daily as needed. 07/26/23   [provider]  nitroGLYCERIN  (NITROSTAT ) 0.4 MG SL tablet Place 1 tablet (0.4 mg total) under the tongue every 5 (five) minutes x 3 doses as needed for chest pain. 10/11/20   McDaniel, Jill D, NP  ondansetron  (ZOFRAN ) 4 MG tablet Take 4 mg by mouth 3 (three) times daily as needed for nausea or vomiting. 11/22/20   [provider]  pantoprazole  (PROTONIX ) 40 MG tablet Take 1 tablet (40 mg total) by mouth 2 (two) times daily. 09/05/22   Bensimhon, Daniel R, MD  senna (SENOKOT) 8.6 MG TABS tablet Take 2 tablets by mouth 2 (two) times daily as needed. 08/09/23   [provider]  spironolactone  (ALDACTONE ) 25 MG tablet Take 0.5 tablets (12.5 mg total) by mouth daily. 04/03/23   Deforest Fast, MD  SYNTHROID  75 MCG tablet Take 75 mcg by mouth daily before breakfast. 10/18/22   [provider]  torsemide  (DEMADEX ) 20 MG tablet Take 1 tablet (20 mg total) by mouth daily. 04/03/23   Deforest Fast, MD      Allergies    Codeine    Review of Systems   Review of Systems  Respiratory:  Positive for shortness of breath.     Physical Exam Updated Vital Signs BP 110/70   Pulse 77   Temp 98 F (36.7  C) (Axillary)   Resp 20   SpO2 97%  Physical Exam Vitals and nursing note reviewed.  Constitutional:      General: She is not in acute distress.    Appearance: She is well-developed. She is not diaphoretic.     Comments: Chronically ill-appearing  HENT:     Head: Normocephalic and atraumatic.  Eyes:     Pupils: Pupils are equal, round, and reactive to light.  Neck:     Vascular: JVD (mid neck) present.  Cardiovascular:     Rate and Rhythm: Normal rate and regular rhythm.     Heart sounds: No murmur heard.    No friction rub. No gallop.  Pulmonary:     Effort: Pulmonary effort is normal.     Breath sounds: Rales present. No wheezing.      Comments: Rales in bases Abdominal:     General: There is no distension.     Palpations: Abdomen is soft.     Tenderness: There is no abdominal tenderness.  Musculoskeletal:        General: No tenderness.     Cervical back: Normal range of motion and neck supple.  Skin:    General: Skin is warm and dry.  Neurological:     Mental Status: She is alert and oriented to person, place, and time.  Psychiatric:        Behavior: Behavior normal.     ED Results / Procedures / Treatments   Labs (all labs ordered are listed, but only abnormal results are displayed) Labs Reviewed  CBC WITH DIFFERENTIAL/PLATELET - Abnormal; Notable for the following components:      Result Value   WBC 12.9 (*)    RDW 15.8 (*)    Neutro Abs 11.7 (*)    Lymphs Abs 0.5 (*)    Abs Immature Granulocytes 0.11 (*)    All other components within normal limits  COMPREHENSIVE METABOLIC PANEL WITH GFR - Abnormal; Notable for the following components:   Sodium 127 (*)    Chloride 89 (*)    Glucose, Bld 102 (*)    Calcium  8.3 (*)    Albumin  2.5 (*)    AST 90 (*)    ALT 87 (*)    Total Bilirubin 1.9 (*)    All other components within normal limits  BRAIN NATRIURETIC PEPTIDE - Abnormal; Notable for the following components:   B Natriuretic Peptide 914.7 (*)    All other components within normal limits  TROPONIN I (HIGH SENSITIVITY) - Abnormal; Notable for the following components:   Troponin I (High Sensitivity) 32 (*)    All other components within normal limits  I-STAT CHEM 8, ED    EKG EKG Interpretation Date/Time:  Friday Aug 17 2023 10:22:29 EDT Ventricular Rate:  83 PR Interval:  69 QRS Duration:  109 QT Interval:  447 QTC Calculation: 526 R Axis:   55  Text Interpretation: Sinus rhythm Short PR interval RSR' in V1 or V2, probably normal variant Repol abnrm suggests ischemia, diffuse leads Prolonged QT interval Since last tracing rate faster Otherwise no significant change Confirmed by Albertus Hughs  [phone]) on 08/17/2023 10:40:18 AM  Radiology DG Chest Port 1 View Result Date: 08/17/2023 CLINICAL DATA:  Shortness of breath. EXAM: PORTABLE CHEST 1 VIEW COMPARISON:  03/24/2023. FINDINGS: Redemonstration of extensive increased interstitial markings throughout bilateral lungs without significant interval change, favoring underlying chronic interstitial lung disease. There is small-to-moderate right pleural effusion, which appears increased since the prior study. There is  probable trace left pleural effusion, grossly unchanged. No pneumothorax. Stable cardio-mediastinal silhouette. No acute osseous abnormalities. The soft tissues are within normal limits. IMPRESSION: *Redemonstration of extensive increased interstitial markings throughout bilateral lungs without significant interval change, favoring underlying chronic interstitial lung disease. *There is small-to-moderate right pleural effusion, which appears increased since the prior study. There is probable trace left pleural effusion, grossly unchanged. Electronically Signed   By: Beula Brunswick M.D.   On: 08/17/2023 10:38    Procedures .Critical Care  Performed by: Albertus Hughs, DO Authorized by: Albertus Hughs, DO   Critical care provider statement:    Critical care time (minutes):  35   Critical care time was exclusive of:  Separately billable procedures and treating other patients   Critical care was time spent personally by me on the following activities:  Development of treatment plan with patient or surrogate, discussions with consultants, evaluation of patient's response to treatment, examination of patient, ordering and review of laboratory studies, ordering and review of radiographic studies, ordering and performing treatments and interventions, pulse oximetry, re-evaluation of patient's condition and review of old charts   Care discussed with: admitting provider       Medications Ordered in ED Medications  ipratropium-albuterol  (DUONEB)  0.5-2.5 (3) MG/3ML nebulizer solution 3 mL (3 mLs Nebulization Given 08/17/23 1035)  ondansetron  (ZOFRAN ) injection 4 mg (4 mg Intravenous Given 08/17/23 1035)  methylPREDNISolone  sodium succinate (SOLU-MEDROL ) 125 mg/2 mL injection 125 mg (125 mg Intravenous Given 08/17/23 1244)  furosemide  (LASIX ) injection 40 mg (40 mg Intravenous Given 08/17/23 1246)  ipratropium-albuterol  (DUONEB) 0.5-2.5 (3) MG/3ML nebulizer solution 3 mL (3 mLs Nebulization Given 08/17/23 1338)    ED Course/ Medical Decision Making/ A&P                                 Medical Decision Making Amount and/or Complexity of Data Reviewed Labs: ordered. Radiology: ordered. ECG/medicine tests: ordered.  Risk Prescription drug management.   78 yo F with a chief complaint of difficulty breathing.  Going on for couple days.  Patient unfortunately is on hospice due to severe COPD and has CHF as well.  She has not really been able to take her diuretics because she has been throwing up as well.  Lung sounds not consistent with COPD but will trial a DuoNeb with history of the same.  Will obtain a laboratory evaluation here.  Antiemetics.  Chest x-ray.  Reassess.  Patient does have some improvement with the DuoNeb here.  Workup seems consistent with fluid overload.  BNP of 900.  Hyponatremia hypochloremia.  Mild leukocytosis.  Troponin mildly above baseline.  Chest x-ray independently interpreted by me with persistent right sided effusion.  Perhaps mildly larger than last.  Will give a bolus dose of Lasix .  She is doing a little bit better after a DuoNeb.  Given Solu-Medrol  as well.  Patient reassessed and still does not feel well enough to go home.  Still requiring oxygen.  Would like to stay in the hospital.  Will discuss with medicine.  The patients results and plan were reviewed and discussed.   Any x-rays performed were independently reviewed by myself.   Differential diagnosis were considered with the presenting  HPI.  Medications  ipratropium-albuterol  (DUONEB) 0.5-2.5 (3) MG/3ML nebulizer solution 3 mL (3 mLs Nebulization Given 08/17/23 1035)  ondansetron  (ZOFRAN ) injection 4 mg (4 mg Intravenous Given 08/17/23 1035)  methylPREDNISolone  sodium succinate (SOLU-MEDROL ) 125 mg/2  mL injection 125 mg (125 mg Intravenous Given 08/17/23 1244)  furosemide  (LASIX ) injection 40 mg (40 mg Intravenous Given 08/17/23 1246)  ipratropium-albuterol  (DUONEB) 0.5-2.5 (3) MG/3ML nebulizer solution 3 mL (3 mLs Nebulization Given 08/17/23 1338)    Vitals:   08/17/23 1145 08/17/23 1200 08/17/23 1215 08/17/23 1230  BP: 114/74 112/69 112/69 110/70  Pulse: 76 78 79 77  Resp: (!) 21 20 (!) 23 20  Temp:      TempSrc:      SpO2: 91% 97% 96% 97%    Final diagnoses:  Acute on chronic respiratory failure with hypoxia (HCC)  COPD exacerbation (HCC)  Acute on chronic combined systolic and diastolic congestive heart failure (HCC)    Admission/ observation were discussed with the admitting physician, patient and/or family and they are comfortable with the plan.           Final Clinical Impression(s) / ED Diagnoses Final diagnoses:  Acute on chronic respiratory failure with hypoxia (HCC)  COPD exacerbation (HCC)  Acute on chronic combined systolic and diastolic congestive heart failure [facility])    Rx / DC Orders ED Discharge Orders     None         Albertus Hughs, DO 08/17/23 1343

## 2023-08-18 DIAGNOSIS — I480 Paroxysmal atrial fibrillation: Secondary | ICD-10-CM | POA: Diagnosis present

## 2023-08-18 DIAGNOSIS — I272 Pulmonary hypertension, unspecified: Secondary | ICD-10-CM | POA: Diagnosis present

## 2023-08-18 DIAGNOSIS — I2510 Atherosclerotic heart disease of native coronary artery without angina pectoris: Secondary | ICD-10-CM | POA: Diagnosis present

## 2023-08-18 DIAGNOSIS — R627 Adult failure to thrive: Secondary | ICD-10-CM | POA: Diagnosis present

## 2023-08-18 DIAGNOSIS — N1831 Chronic kidney disease, stage 3a: Secondary | ICD-10-CM | POA: Diagnosis present

## 2023-08-18 DIAGNOSIS — E785 Hyperlipidemia, unspecified: Secondary | ICD-10-CM | POA: Diagnosis present

## 2023-08-18 DIAGNOSIS — J9621 Acute and chronic respiratory failure with hypoxia: Secondary | ICD-10-CM | POA: Diagnosis present

## 2023-08-18 DIAGNOSIS — I5043 Acute on chronic combined systolic (congestive) and diastolic (congestive) heart failure: Secondary | ICD-10-CM | POA: Diagnosis present

## 2023-08-18 DIAGNOSIS — Z66 Do not resuscitate: Secondary | ICD-10-CM | POA: Diagnosis present

## 2023-08-18 DIAGNOSIS — R112 Nausea with vomiting, unspecified: Secondary | ICD-10-CM | POA: Diagnosis present

## 2023-08-18 DIAGNOSIS — D72828 Other elevated white blood cell count: Secondary | ICD-10-CM | POA: Diagnosis present

## 2023-08-18 DIAGNOSIS — J849 Interstitial pulmonary disease, unspecified: Secondary | ICD-10-CM | POA: Diagnosis present

## 2023-08-18 DIAGNOSIS — M109 Gout, unspecified: Secondary | ICD-10-CM | POA: Diagnosis present

## 2023-08-18 DIAGNOSIS — Z91148 Patient's other noncompliance with medication regimen for other reason: Secondary | ICD-10-CM | POA: Diagnosis not present

## 2023-08-18 DIAGNOSIS — T502X6A Underdosing of carbonic-anhydrase inhibitors, benzothiadiazides and other diuretics, initial encounter: Secondary | ICD-10-CM | POA: Diagnosis present

## 2023-08-18 DIAGNOSIS — G3184 Mild cognitive impairment, so stated: Secondary | ICD-10-CM | POA: Diagnosis present

## 2023-08-18 DIAGNOSIS — J441 Chronic obstructive pulmonary disease with (acute) exacerbation: Secondary | ICD-10-CM | POA: Diagnosis present

## 2023-08-18 DIAGNOSIS — E43 Unspecified severe protein-calorie malnutrition: Secondary | ICD-10-CM | POA: Diagnosis present

## 2023-08-18 DIAGNOSIS — I071 Rheumatic tricuspid insufficiency: Secondary | ICD-10-CM | POA: Diagnosis present

## 2023-08-18 DIAGNOSIS — J9601 Acute respiratory failure with hypoxia: Secondary | ICD-10-CM | POA: Diagnosis not present

## 2023-08-18 DIAGNOSIS — E871 Hypo-osmolality and hyponatremia: Secondary | ICD-10-CM | POA: Diagnosis present

## 2023-08-18 DIAGNOSIS — Z7901 Long term (current) use of anticoagulants: Secondary | ICD-10-CM | POA: Diagnosis not present

## 2023-08-18 DIAGNOSIS — E039 Hypothyroidism, unspecified: Secondary | ICD-10-CM | POA: Diagnosis present

## 2023-08-18 DIAGNOSIS — K219 Gastro-esophageal reflux disease without esophagitis: Secondary | ICD-10-CM | POA: Diagnosis present

## 2023-08-18 DIAGNOSIS — M810 Age-related osteoporosis without current pathological fracture: Secondary | ICD-10-CM | POA: Diagnosis present

## 2023-08-18 LAB — CBC
HCT: 35.3 % — ABNORMAL LOW (ref 36.0–46.0)
Hemoglobin: 12.8 g/dL (ref 12.0–15.0)
MCH: 34.4 pg — ABNORMAL HIGH (ref 26.0–34.0)
MCHC: 36.3 g/dL — ABNORMAL HIGH (ref 30.0–36.0)
MCV: 94.9 fL (ref 80.0–100.0)
Platelets: 341 10*3/uL (ref 150–400)
RBC: 3.72 MIL/uL — ABNORMAL LOW (ref 3.87–5.11)
RDW: 15.5 % (ref 11.5–15.5)
WBC: 15.9 10*3/uL — ABNORMAL HIGH (ref 4.0–10.5)
nRBC: 0 % (ref 0.0–0.2)

## 2023-08-18 LAB — BASIC METABOLIC PANEL WITH GFR
Anion gap: 9 (ref 5–15)
BUN: 13 mg/dL (ref 8–23)
CO2: 25 mmol/L (ref 22–32)
Calcium: 7.9 mg/dL — ABNORMAL LOW (ref 8.9–10.3)
Chloride: 90 mmol/L — ABNORMAL LOW (ref 98–111)
Creatinine, Ser: 0.69 mg/dL (ref 0.44–1.00)
GFR, Estimated: >60 mL/min (ref >60)
Glucose, Bld: 100 mg/dL — ABNORMAL HIGH (ref 70–99)
Potassium: 3.6 mmol/L (ref 3.5–5.1)
Sodium: 124 mmol/L — ABNORMAL LOW (ref 135–145)

## 2023-08-18 MED ORDER — APIXABAN 5 MG PO TABS
5.0000 mg | ORAL_TABLET | Freq: Two times a day (BID) | ORAL | Status: DC
  Administered 2023-08-18 – 2023-08-21 (×6): 5 mg via ORAL
  Filled 2023-08-18 (×6): qty 1

## 2023-08-18 MED ORDER — ALBUMIN HUMAN 25 % IV SOLN
25.0000 g | Freq: Four times a day (QID) | INTRAVENOUS | Status: AC
  Administered 2023-08-18 (×2): 25 g via INTRAVENOUS
  Filled 2023-08-18 (×2): qty 100

## 2023-08-18 MED ORDER — SPIRONOLACTONE 25 MG PO TABS
25.0000 mg | ORAL_TABLET | Freq: Every day | ORAL | Status: DC
  Administered 2023-08-19 – 2023-08-21 (×3): 25 mg via ORAL
  Filled 2023-08-18 (×3): qty 1

## 2023-08-18 MED ORDER — ONDANSETRON HCL 4 MG/2ML IJ SOLN
4.0000 mg | Freq: Four times a day (QID) | INTRAMUSCULAR | Status: DC | PRN
  Administered 2023-08-18 – 2023-08-20 (×4): 4 mg via INTRAVENOUS
  Filled 2023-08-18 (×4): qty 2

## 2023-08-18 NOTE — Progress Notes (Signed)
 Laurie [facility] 3E01 [facility] Liaison Note?     [name] is a current [facility] hospice patient with a terminal diagnosis of abnormal weight loss. Husband alerted EMS due to decreased oxygen saturation readings. Husband informed [facility] that he wanted patient evaluated at [facility] due to symptoms. EMS who took patient to the ED. She was admitted with diagnosis of acute respiratory failure with hypoxia. Per Dr. Jerlyn Moons with [facility] this is a related [facility] admission.    Visited patient in [facility]. Patient was awake with no acute distress noted. She states she had not been taking diuretic at home due to increased nausea. Reports that she has been getting diuretic vis IV in the [facility] and this has helping with shortness of breath slightly. Patient denied pain or discomfort a this time.    Patient is inpatient appropriate due to need for IV diuretics and Albumin .   Vital Signs:?97.6/65/20    105/69   97% 2L Laurie [facility]   I/O:  652.6/400   Abnormal labs:  08/17/23 10:14 COMPREHENSIVE METABOLIC PANEL WITH GFR: Rpt ! Sodium: 127 (L) Chloride: 89 (L) Glucose: 102 (H) Calcium : 8.3 (L) Albumin : 2.5 (L) AST: 90 (H) ALT: 87 (H) Total Bilirubin: 1.9 (H) Troponin I (High Sensitivity): 32 (H) WBC: 12.9 (H) RDW: 15.8 (H) NEUT#: 11.7 (H) Lymphs Abs: 0.5 (L) Abs Immature Granulocytes: 0.11 (H)  08/17/23 10:45 B Natriuretic Peptide: 914.7 (H)  08/17/23 17:40 BASIC METABOLIC PANEL WITH GFR: Rpt ! Sodium: 125 (L) Chloride: 90 (L) Glucose: 134 (H) Calcium : 7.9 (L)  08/17/23 23:01 BASIC METABOLIC PANEL WITH GFR: Rpt ! Sodium: 126 (L) Chloride: 89 (L) Glucose: 120 (H) Calcium : 8.1 (L)  08/18/23 02:12 BASIC METABOLIC PANEL WITH GFR: Rpt ! Sodium: 124 (L) Chloride: 90 (L) Glucose: 100 (H) Calcium : 7.9 (L)  08/18/23 08:37 WBC: 15.9 (H) RBC: 3.72 (L) HCT: 35.3 (L) MCH: 34.4 (H) MCHC: 36.3 (H)   Diagnostics:  PORTABLE CHEST 1 VIEW 5.9.25    COMPARISON:   03/24/2023.   FINDINGS: Redemonstration of extensive increased interstitial markings throughout bilateral lungs without significant interval change, favoring underlying chronic interstitial lung disease. There is small-to-moderate right pleural effusion, which appears increased since the prior study. There is probable trace left pleural effusion, grossly unchanged. No pneumothorax.   Stable cardio-mediastinal silhouette.   No acute osseous abnormalities.   The soft tissues are within normal limits.   IMPRESSION: *Redemonstration of extensive increased interstitial markings throughout bilateral lungs without significant interval change, favoring underlying chronic interstitial lung disease. *There is small-to-moderate right pleural effusion, which appears increased since the prior study. There is probable trace left pleural effusion, grossly unchanged.     Electronically Signed   By: Beula Brunswick M.D.   On: 08/17/2023 10:38     IV/PRN Meds: Lasix  40mg  IV BID, Zofran  4mg  IV x1, Solu-Medrol  125mg  IV x1, Albumin  25g IV x1   Problem list per MD note Deforest Fast, MD 5.10.25: Assessment/Plan: Acute on chronic respiratory failure with hypoxia Acute on chronic combined CHF Pulmonary hypertension Tricuspid regurgitation > Last echo was in December 2024 with EF 50%, global hypokinesis, moderately reduced RV function, increased RV pressure, moderate to severe tricuspid regurgitation, evidence of pulmonary hypertension. -Readmitted with volume overload, followed by hospice but requested hospitalization for diuresis -Continue Lasix  40 Mg twice daily, Aldactone , add albumin  X2 doses -Conservative management   Hypervolemic Hyponatremia -In the setting of hypervolemia, monitor with diuretics, mental status is stable   Severe protein calorie malnutrition   Adult failure to  thrive Severe debility, has been been bedbound for several months   Leukocytosis -Likely reactive,  monitor   P Atrial fibrillation -She is in sinus rhythm at this time - Continue amiodarone  and Eliquis    Hyperlipidemia - Continue atorvastatin    Hypothyroidism - Continue home Synthroid    GERD - Continue home PPI   CKD 3A > Creatinine stable - Trend renal function and electrolytes   CAD - Continue Eliquis , atorvastatin    Mild cognitive impairment - Noted   OSA - Not on CPAP   COPD - Replace home Symbicort  formulary Dulera  - Continue as needed albuterol    Discharge Planning:? Ongoing   Family contact: attempted to contact husband via phone. Mailbox full unable to leave message   IDT:? Updated?     Goals of Care: DNR   If patient requires EMS transport at discharge, please us  [facility] as that is who [facility] is contracted with for transport.   Please call with any hospice related questions or concerns.   Jacqlyn Matas, BSN, RN Hospice Nurse Liaison [phone]

## 2023-08-18 NOTE — Plan of Care (Signed)
 Pt A/Ox4, VSS. Pt is mostly blind and requires help feeding herself. Incontinent and has PW in place. +2 pitting edema BLE. Wound on top of L foot assessed and cleansed. Pt Q2 turns. Adequate I/O's. No c/o pain. Bed alarm on and call light within reach.   Problem: Education: Goal: Knowledge of General Education information will improve Description: Including pain rating scale, medication(s)/side effects and non-pharmacologic comfort measures Outcome: Progressing   Problem: Health Behavior/Discharge Planning: Goal: Ability to manage health-related needs will improve Outcome: Progressing   Problem: Clinical Measurements: Goal: Ability to maintain clinical measurements within normal limits will improve Outcome: Progressing Goal: Will remain free from infection Outcome: Progressing Goal: Diagnostic test results will improve Outcome: Progressing Goal: Respiratory complications will improve Outcome: Progressing Goal: Cardiovascular complication will be avoided Outcome: Progressing   Problem: Activity: Goal: Risk for activity intolerance will decrease Outcome: Progressing   Problem: Nutrition: Goal: Adequate nutrition will be maintained Outcome: Progressing   Problem: Coping: Goal: Level of anxiety will decrease Outcome: Progressing   Problem: Elimination: Goal: Will not experience complications related to bowel motility Outcome: Progressing Goal: Will not experience complications related to urinary retention Outcome: Progressing   Problem: Pain Managment: Goal: General experience of comfort will improve and/or be controlled Outcome: Progressing   Problem: Safety: Goal: Ability to remain free from injury will improve Outcome: Progressing   Problem: Skin Integrity: Goal: Risk for impaired skin integrity will decrease Outcome: Progressing   Problem: Education: Goal: Ability to demonstrate management of disease process will improve Outcome: Progressing Goal: Ability to  verbalize understanding of medication therapies will improve Outcome: Progressing Goal: Individualized Educational Video(s) Outcome: Progressing   Problem: Activity: Goal: Capacity to carry out activities will improve Outcome: Progressing   Problem: Cardiac: Goal: Ability to achieve and maintain adequate cardiopulmonary perfusion will improve Outcome: Progressing

## 2023-08-18 NOTE — Progress Notes (Signed)
 PROGRESS NOTE    Laurie James  WUJ:[id number] DOB: [birth year] DOA: 08/17/2023 PCP: Jearldine Mina, MD  77/F w COPD, chronic combined CHF, pulmonary hypertension, tricuspid regurgitation, A-fib, hypothyroidism, severe CAD, CKD 3 AA, mild cognitive impairment, severe malnutrition, currently followed by hospice presented to the ED with progressive dyspnea and edema.  In the ED mildly tachypneic, sodium 127, albumin  2.5, troponin 32, BNP 914, chest x-ray with extensive interstitial markings favored ILD with small to moderate right pleural effusion  Subjective: - Feels fair today, overall considerably weaker over the last few months  Assessment and Plan:  Acute on chronic respiratory failure with hypoxia Acute on chronic combined CHF Pulmonary hypertension Tricuspid regurgitation > Last echo was in December 2024 with EF 50%, global hypokinesis, moderately reduced RV function, increased RV pressure, moderate to severe tricuspid regurgitation, evidence of pulmonary hypertension. -Readmitted with volume overload, followed by hospice but requested hospitalization for diuresis -Continue Lasix  40 Mg twice daily, Aldactone , add albumin  X2 doses -Conservative management  Hypervolemic Hyponatremia -In the setting of hypervolemia, monitor with diuretics, mental status is stable  Severe protein calorie malnutrition  Adult failure to thrive Severe debility, has been been bedbound for several months   Leukocytosis -Likely reactive, monitor   P Atrial fibrillation -She is in sinus rhythm at this time - Continue amiodarone  and Eliquis    Hyperlipidemia - Continue atorvastatin    Hypothyroidism - Continue home Synthroid    GERD - Continue home PPI   CKD 3A > Creatinine stable - Trend renal function and electrolytes   CAD - Continue Eliquis , atorvastatin    Mild cognitive impairment - Noted   OSA - Not on CPAP   COPD - Replace home Symbicort  formulary Dulera  - Continue  as needed albuterol    DVT prophylaxis:      Eliquis  Code Status:              30 Family Communication:       None on admission Disposition Plan:   Consultants:    Procedures:   Antimicrobials:    Objective: Vitals:   08/17/23 2005 08/18/23 0008 08/18/23 0414 08/18/23 0745  BP: 118/69 120/70 108/67 109/75  Pulse: 67 70 68 65  Resp: 20 20 20 20   Temp: 97.8 F (36.6 C) 97.6 F (36.4 C) 98 F (36.7 C) 97.6 F (36.4 C)  TempSrc: Axillary Oral Oral Oral  SpO2: 96% 95% 98% 97%  Weight:   46.7 kg   Height:        Intake/Output Summary (Last 24 hours) at 08/18/2023 1032 Last data filed at 08/18/2023 0820 Gross per 24 hour  Intake 363 ml  Output 400 ml  Net -37 ml   Filed Weights   08/17/23 1628 08/18/23 0414  Weight: 44.1 kg 46.7 kg    Examination:  General exam: Appears calm and comfortable  Respiratory system: Clear to auscultation Cardiovascular system: S1 & S2 heard, RRR.  Abd: nondistended, soft and nontender.Normal bowel sounds heard. Central nervous system: Alert and oriented. No focal neurological deficits. Extremities: no edema Skin: No rashes Psychiatry:  Mood & affect appropriate.     Data Reviewed:   CBC: Recent Labs  Lab 08/17/23 1014 08/18/23 0837  WBC 12.9* 15.9*  NEUTROABS 11.7*  --   HGB 13.0 12.8  HCT 37.2 35.3*  MCV 95.6 94.9  PLT 358 341   Basic Metabolic Panel: Recent Labs  Lab 08/17/23 1014 08/17/23 1740 08/17/23 2301 08/18/23 0212  NA 127* 125* 126* 124*  K 3.7 3.6 3.6 3.6  CL 89* 90* 89* 90*  CO2 25 23 26 25   GLUCOSE 102* 134* 120* 100*  BUN 13 12 13 13   CREATININE 0.78 0.78 0.80 0.69  CALCIUM  8.3* 7.9* 8.1* 7.9*  MG  --  1.7  --   --    GFR: Estimated Creatinine Clearance: 37.7 mL/min (by C-G formula based on SCr of 0.69 mg/dL). Liver Function Tests: Recent Labs  Lab 08/17/23 1014  AST 90*  ALT 87*  ALKPHOS 64  BILITOT 1.9*  PROT 7.3  ALBUMIN  2.5*   No results for input(s): "LIPASE", "AMYLASE" in the  last 168 hours. No results for input(s): "AMMONIA" in the last 168 hours. Coagulation Profile: No results for input(s): "INR", "PROTIME" in the last 168 hours. Cardiac Enzymes: No results for input(s): "CKTOTAL", "CKMB", "CKMBINDEX", "TROPONINI" in the last 168 hours. BNP (last 3 results) No results for input(s): "PROBNP" in the last 8760 hours. HbA1C: No results for input(s): "HGBA1C" in the last 72 hours. CBG: No results for input(s): "GLUCAP" in the last 168 hours. Lipid Profile: No results for input(s): "CHOL", "HDL", "LDLCALC", "TRIG", "CHOLHDL", "LDLDIRECT" in the last 72 hours. Thyroid  Function Tests: No results for input(s): "TSH", "T4TOTAL", "FREET4", "T3FREE", "THYROIDAB" in the last 72 hours. Anemia Panel: No results for input(s): "VITAMINB12", "FOLATE", "FERRITIN", "TIBC", "IRON", "RETICCTPCT" in the last 72 hours. Urine analysis:    Component Value Date/Time   COLORURINE YELLOW 05/01/2022 1922   APPEARANCEUR HAZY (A) 05/01/2022 1922   LABSPEC 1.029 05/01/2022 1922   PHURINE 5.0 05/01/2022 1922   GLUCOSEU 50 (A) 05/01/2022 1922   HGBUR SMALL (A) 05/01/2022 1922   BILIRUBINUR NEGATIVE 05/01/2022 1922   KETONESUR NEGATIVE 05/01/2022 1922   PROTEINUR 100 (A) 05/01/2022 1922   NITRITE NEGATIVE 05/01/2022 1922   LEUKOCYTESUR NEGATIVE 05/01/2022 1922   Sepsis Labs: @LABRCNTIP (procalcitonin:4,lacticidven:4)  )No results found for this or any previous visit (from the past 240 hours).   Radiology Studies: DG Chest Port 1 View Result Date: 08/17/2023 CLINICAL DATA:  Shortness of breath. EXAM: PORTABLE CHEST 1 VIEW COMPARISON:  03/24/2023. FINDINGS: Redemonstration of extensive increased interstitial markings throughout bilateral lungs without significant interval change, favoring underlying chronic interstitial lung disease. There is small-to-moderate right pleural effusion, which appears increased since the prior study. There is probable trace left pleural effusion, grossly  unchanged. No pneumothorax. Stable cardio-mediastinal silhouette. No acute osseous abnormalities. The soft tissues are within normal limits. IMPRESSION: *Redemonstration of extensive increased interstitial markings throughout bilateral lungs without significant interval change, favoring underlying chronic interstitial lung disease. *There is small-to-moderate right pleural effusion, which appears increased since the prior study. There is probable trace left pleural effusion, grossly unchanged. Electronically Signed   By: Beula Brunswick M.D.   On: 08/17/2023 10:38     Scheduled Meds:  amiodarone   200 mg Oral Daily   apixaban   2.5 mg Oral BID   atorvastatin   40 mg Oral Daily   fluticasone furoate-vilanterol  1 puff Inhalation Daily   furosemide   40 mg Intravenous BID   levothyroxine   75 mcg Oral QAC breakfast   pantoprazole   40 mg Oral BID   sodium chloride  flush  3 mL Intravenous Q12H   [START ON 08/19/2023] spironolactone   25 mg Oral Daily   Continuous Infusions:  albumin  human 25 g (08/18/23 0934)     LOS: 0 days    Time spent:    Deforest Fast, MD [facility]   08/18/2023, 10:32 AM

## 2023-08-19 DIAGNOSIS — J9601 Acute respiratory failure with hypoxia: Secondary | ICD-10-CM | POA: Diagnosis not present

## 2023-08-19 LAB — URINALYSIS, ROUTINE W REFLEX MICROSCOPIC
Bilirubin Urine: NEGATIVE
Glucose, UA: NEGATIVE mg/dL
Ketones, ur: NEGATIVE mg/dL
Nitrite: NEGATIVE
Protein, ur: NEGATIVE mg/dL
Specific Gravity, Urine: 1.008 (ref 1.005–1.030)
pH: 6 (ref 5.0–8.0)

## 2023-08-19 LAB — BASIC METABOLIC PANEL WITH GFR
Anion gap: 10 (ref 5–15)
BUN: 15 mg/dL (ref 8–23)
CO2: 27 mmol/L (ref 22–32)
Calcium: 8.4 mg/dL — ABNORMAL LOW (ref 8.9–10.3)
Chloride: 88 mmol/L — ABNORMAL LOW (ref 98–111)
Creatinine, Ser: 0.78 mg/dL (ref 0.44–1.00)
GFR, Estimated: >60 mL/min (ref >60)
Glucose, Bld: 109 mg/dL — ABNORMAL HIGH (ref 70–99)
Potassium: 3.3 mmol/L — ABNORMAL LOW (ref 3.5–5.1)
Sodium: 125 mmol/L — ABNORMAL LOW (ref 135–145)

## 2023-08-19 MED ORDER — POTASSIUM CHLORIDE CRYS ER 20 MEQ PO TBCR
40.0000 meq | EXTENDED_RELEASE_TABLET | Freq: Two times a day (BID) | ORAL | Status: AC
  Administered 2023-08-19 (×2): 40 meq via ORAL
  Filled 2023-08-19 (×2): qty 2

## 2023-08-19 MED ORDER — FUROSEMIDE 10 MG/ML IJ SOLN
60.0000 mg | Freq: Two times a day (BID) | INTRAMUSCULAR | Status: DC
  Administered 2023-08-19 – 2023-08-20 (×2): 60 mg via INTRAVENOUS
  Filled 2023-08-19 (×2): qty 6

## 2023-08-19 NOTE — Progress Notes (Signed)
 [facility] 3E[facility] Liaison Note?     Mrs. Rashae Ferderer is a current [facility] hospice patient with a terminal diagnosis of abnormal weight loss. Husband alerted EMS due to decreased oxygen saturation readings. Husband informed [facility] that he wanted patient evaluated at hospital due to symptoms. EMS who took patient to the ED. She was admitted with diagnosis of acute respiratory failure with hypoxia. Per Dr. Jerlyn Moons with [facility] this is a related hospital admission.    Visited patient in hospital. Patient was resting with eyes closed. Did not wake patient. Spoke with bedside nurse. States that patient has been having increased nausea today and decreased appetite. Reports that family called earlier and it appeared they did not have a good understanding of patient condition.    Patient is inpatient appropriate due to need for IV diuretics and Albumin .   Vital Signs:?97.6/70/18    122/77   91% 12L HFNC   I/O:  1272/1000   Abnormal labs:  08/19/23 02:28 BASIC METABOLIC PANEL WITH GFR: Rpt ! Sodium: 125 (L) Potassium: 3.3 (L) Chloride: 88 (L) Glucose: 109 (H) Calcium : 8.4 (L)    Diagnostics:  None New    IV/PRN Meds: Lasix  40mg  IV x2, Zofran  4mg  IV x2, Albumin  25g IV x1   Problem list per MD note Deforest Fast, MD 5.11.25: Assessment/Plan:  Acute on chronic respiratory failure with hypoxia Acute on chronic combined CHF Pulmonary hypertension Tricuspid regurgitation > Last echo was in December 2024 with EF 50%, global hypokinesis, moderately reduced RV function, increased RV pressure, moderate to severe tricuspid regurgitation, evidence of pulmonary hypertension. -Readmitted with volume overload, followed by hospice but requested hospitalization for diuresis - Continue Lasix , increased dose to 60 Mg twice daily, continue Aldactone  -Conservative management - Resume hospice services at discharge   Hypervolemic Hyponatremia -In the setting of  hypervolemia, monitor with diuretics, mental status is stable   Severe protein calorie malnutrition   Adult failure to thrive Severe debility, has been been bedbound for several months   Leukocytosis -Likely reactive, monitor   P Atrial fibrillation -She is in sinus rhythm at this time - Continue amiodarone  and Eliquis    Hyperlipidemia - Continue atorvastatin    Hypothyroidism - Continue home Synthroid    GERD - Continue home PPI   CKD 3A > Creatinine stable - Trend renal function and electrolytes   CAD - Continue Eliquis , atorvastatin    Mild cognitive impairment - Noted   OSA - Not on CPAP   COPD - Replace home Symbicort  formulary Dulera  - Continue as needed albuterol    Discharge Planning:?Home with hospice likely 1 to 2 days    Family contact: attempted again to contact husband via phone. Mailbox full unable to leave message.   IDT:? Updated?     Goals of Care: DNR   If patient requires EMS transport at discharge, please us  [facility] as that is who [facility] is contracted with for transport.   Please call with any hospice related questions or concerns.   Jacqlyn Matas, BSN, RN Hospice Nurse Liaison [phone]

## 2023-08-19 NOTE — Progress Notes (Signed)
 PROGRESS NOTE    Laurie James  UJW:[id number] DOB: Sep 04, 1945 DOA: 08/17/2023 PCP: Jearldine Mina, MD  77/F w COPD, chronic combined CHF, pulmonary hypertension, tricuspid regurgitation, A-fib, hypothyroidism, severe CAD, CKD 3 AA, mild cognitive impairment, severe malnutrition, currently followed by hospice presented to the ED with progressive dyspnea and edema.  In the ED mildly tachypneic, sodium 127, albumin  2.5, troponin 32, BNP 914, chest x-ray with extensive interstitial markings favored ILD with small to moderate right pleural effusion. - Admitted, started on diuretics - 5/11 AM, increased O2 needs overnight  Subjective: - Reports that her breathing is slowly improving  Assessment and Plan:  Acute on chronic respiratory failure with hypoxia Acute on chronic combined CHF Pulmonary hypertension Tricuspid regurgitation > Last echo was in December 2024 with EF 50%, global hypokinesis, moderately reduced RV function, increased RV pressure, moderate to severe tricuspid regurgitation, evidence of pulmonary hypertension. -Readmitted with volume overload, followed by hospice but requested hospitalization for diuresis - Continue Lasix , increased dose to 60 Mg twice daily, continue Aldactone  -Conservative management - Resume hospice services at discharge  Hypervolemic Hyponatremia -In the setting of hypervolemia, monitor with diuretics, mental status is stable  Severe protein calorie malnutrition  Adult failure to thrive Severe debility, has been been bedbound for several months   Leukocytosis -Likely reactive, monitor   P Atrial fibrillation -She is in sinus rhythm at this time - Continue amiodarone  and Eliquis    Hyperlipidemia - Continue atorvastatin    Hypothyroidism - Continue home Synthroid    GERD - Continue home PPI   CKD 3A > Creatinine stable - Trend renal function and electrolytes   CAD - Continue Eliquis , atorvastatin    Mild cognitive  impairment - Noted   OSA - Not on CPAP   COPD - Replace home Symbicort  formulary Dulera  - Continue as needed albuterol    DVT prophylaxis:      Eliquis  Code Status:              30 Family Communication:       None on admission Disposition Plan: Home with hospice likely 1 to 2 days  Consultants:    Procedures:   Antimicrobials:    Objective: Vitals:   08/19/23 0027 08/19/23 0240 08/19/23 0313 08/19/23 0700  BP: 111/72  122/77   Pulse:   70   Resp:  18    Temp: 97.8 F (36.6 C)  97.6 F (36.4 C)   TempSrc: Oral  Oral   SpO2:  94% 91%   Weight:    44.4 kg  Height:        Intake/Output Summary (Last 24 hours) at 08/19/2023 1032 Last data filed at 08/19/2023 0600 Gross per 24 hour  Intake 949.58 ml  Output 600 ml  Net 349.58 ml   Filed Weights   08/17/23 1628 08/18/23 0414 08/19/23 0700  Weight: 44.1 kg 46.7 kg 44.4 kg    Examination:  General exam: Appears calm and comfortable  Respiratory system: Clear to auscultation Cardiovascular system: S1 & S2 heard, RRR.  Abd: nondistended, soft and nontender.Normal bowel sounds heard. Central nervous system: Alert and oriented. No focal neurological deficits. Extremities: no edema Skin: No rashes Psychiatry:  Mood & affect appropriate.     Data Reviewed:   CBC: Recent Labs  Lab 08/17/23 1014 08/18/23 0837  WBC 12.9* 15.9*  NEUTROABS 11.7*  --   HGB 13.0 12.8  HCT 37.2 35.3*  MCV 95.6 94.9  PLT 358 341   Basic Metabolic Panel: Recent Labs  Lab  08/17/23 1014 08/17/23 1740 08/17/23 2301 08/18/23 0212 08/19/23 0228  NA 127* 125* 126* 124* 125*  K 3.7 3.6 3.6 3.6 3.3*  CL 89* 90* 89* 90* 88*  CO2 25 23 26 25 27   GLUCOSE 102* 134* 120* 100* 109*  BUN 13 12 13 13 15   CREATININE 0.78 0.78 0.80 0.69 0.78  CALCIUM  8.3* 7.9* 8.1* 7.9* 8.4*  MG  --  1.7  --   --   --    GFR: Estimated Creatinine Clearance: 36.7 mL/min (by C-G formula based on SCr of 0.78 mg/dL). Liver Function Tests: Recent Labs   Lab 08/17/23 1014  AST 90*  ALT 87*  ALKPHOS 64  BILITOT 1.9*  PROT 7.3  ALBUMIN  2.5*   No results for input(s): "LIPASE", "AMYLASE" in the last 168 hours. No results for input(s): "AMMONIA" in the last 168 hours. Coagulation Profile: No results for input(s): "INR", "PROTIME" in the last 168 hours. Cardiac Enzymes: No results for input(s): "CKTOTAL", "CKMB", "CKMBINDEX", "TROPONINI" in the last 168 hours. BNP (last 3 results) No results for input(s): "PROBNP" in the last 8760 hours. HbA1C: No results for input(s): "HGBA1C" in the last 72 hours. CBG: No results for input(s): "GLUCAP" in the last 168 hours. Lipid Profile: No results for input(s): "CHOL", "HDL", "LDLCALC", "TRIG", "CHOLHDL", "LDLDIRECT" in the last 72 hours. Thyroid  Function Tests: No results for input(s): "TSH", "T4TOTAL", "FREET4", "T3FREE", "THYROIDAB" in the last 72 hours. Anemia Panel: No results for input(s): "VITAMINB12", "FOLATE", "FERRITIN", "TIBC", "IRON", "RETICCTPCT" in the last 72 hours. Urine analysis:    Component Value Date/Time   COLORURINE YELLOW 05/01/2022 1922   APPEARANCEUR HAZY (A) 05/01/2022 1922   LABSPEC 1.029 05/01/2022 1922   PHURINE 5.0 05/01/2022 1922   GLUCOSEU 50 (A) 05/01/2022 1922   HGBUR SMALL (A) 05/01/2022 1922   BILIRUBINUR NEGATIVE 05/01/2022 1922   KETONESUR NEGATIVE 05/01/2022 1922   PROTEINUR 100 (A) 05/01/2022 1922   NITRITE NEGATIVE 05/01/2022 1922   LEUKOCYTESUR NEGATIVE 05/01/2022 1922   Sepsis Labs: @LABRCNTIP (procalcitonin:4,lacticidven:4)  )No results found for this or any previous visit (from the past 240 hours).   Radiology Studies: No results found.    Scheduled Meds:  amiodarone   200 mg Oral Daily   apixaban   5 mg Oral BID   atorvastatin   40 mg Oral Daily   fluticasone furoate-vilanterol  1 puff Inhalation Daily   furosemide   40 mg Intravenous BID   levothyroxine   75 mcg Oral QAC breakfast   pantoprazole   40 mg Oral BID   potassium chloride    40 mEq Oral BID   sodium chloride  flush  3 mL Intravenous Q12H   spironolactone   25 mg Oral Daily   Continuous Infusions:     LOS: 1 day    Time spent:    Deforest Fast, MD [facility]   08/19/2023, 10:32 AM

## 2023-08-20 ENCOUNTER — Inpatient Hospital Stay (HOSPITAL_COMMUNITY)

## 2023-08-20 DIAGNOSIS — J9601 Acute respiratory failure with hypoxia: Secondary | ICD-10-CM | POA: Diagnosis not present

## 2023-08-20 LAB — BASIC METABOLIC PANEL WITH GFR
Anion gap: 12 (ref 5–15)
BUN: 17 mg/dL (ref 8–23)
CO2: 26 mmol/L (ref 22–32)
Calcium: 8.2 mg/dL — ABNORMAL LOW (ref 8.9–10.3)
Chloride: 89 mmol/L — ABNORMAL LOW (ref 98–111)
Creatinine, Ser: 0.84 mg/dL (ref 0.44–1.00)
GFR, Estimated: >60 mL/min (ref >60)
Glucose, Bld: 83 mg/dL (ref 70–99)
Potassium: 4.7 mmol/L (ref 3.5–5.1)
Sodium: 127 mmol/L — ABNORMAL LOW (ref 135–145)

## 2023-08-20 MED ORDER — POTASSIUM CHLORIDE CRYS ER 20 MEQ PO TBCR
40.0000 meq | EXTENDED_RELEASE_TABLET | Freq: Once | ORAL | Status: AC
  Administered 2023-08-20: 40 meq via ORAL
  Filled 2023-08-20: qty 2

## 2023-08-20 MED ORDER — ALBUMIN HUMAN 25 % IV SOLN
25.0000 g | Freq: Four times a day (QID) | INTRAVENOUS | Status: AC
  Administered 2023-08-20 (×2): 25 g via INTRAVENOUS
  Filled 2023-08-20 (×2): qty 100

## 2023-08-20 MED ORDER — ENSURE ENLIVE PO LIQD: 237.0000 mL | Freq: Two times a day (BID) | ORAL | Status: DC

## 2023-08-20 MED ORDER — FUROSEMIDE 10 MG/ML IJ SOLN
60.0000 mg | Freq: Three times a day (TID) | INTRAMUSCULAR | Status: DC
  Administered 2023-08-20 – 2023-08-21 (×3): 60 mg via INTRAVENOUS
  Filled 2023-08-20 (×3): qty 6

## 2023-08-20 NOTE — Progress Notes (Signed)
 PROGRESS NOTE    Laurie James Laurie James  MVH:[id number] DOB: 05/06/1945 DOA: 08/17/2023 PCP: Jearldine Mina, MD  77/F w COPD, chronic combined CHF, pulmonary hypertension, tricuspid regurgitation, A-fib, hypothyroidism, severe CAD, CKD 3 AA, mild cognitive impairment, severe malnutrition, currently followed by hospice presented to the ED with progressive dyspnea and edema.  In the ED mildly tachypneic, sodium 127, albumin  2.5, troponin 32, BNP 914, chest x-ray with extensive interstitial markings favored ILD with small to moderate right pleural effusion. - Admitted, started on diuretics - 5/11 AM, increased O2 needs overnight  Subjective: - Reports that her breathing is slowly improving  Assessment and Plan:  Acute on chronic respiratory failure with hypoxia Acute on chronic combined CHF Pulmonary hypertension Tricuspid regurgitation > Last echo was in December 2024 with EF 50%, global hypokinesis, moderately reduced RV function, increased RV pressure, moderate to severe tricuspid regurgitation,+ pulmonary hypertension. -Readmitted with volume overload, followed by hospice but requested hospitalization for diuresis - Continue Lasix , increased dose to 60 Mg twice daily, continue Aldactone , repeat albumin  -Worsening hypoxia overnight, x-ray with interstitial edema in the background of chronic findings -Conservative management - Resume hospice services at discharge, possibly 1 to 2 days, discussed poor prognosis with patient and daughter  Hypervolemic Hyponatremia -In the setting of hypervolemia, monitor with diuretics, mental status is stable  Severe protein calorie malnutrition  Adult failure to thrive Severe debility, has been been bedbound for several months   Leukocytosis -Likely reactive, monitor   P Atrial fibrillation -She is in sinus rhythm at this time - Continue amiodarone  and Eliquis    Hyperlipidemia - Continue atorvastatin    Hypothyroidism - Continue home  Synthroid    GERD - Continue home PPI   CKD 3A > Creatinine stable - Trend renal function and electrolytes   CAD - Continue Eliquis , atorvastatin    Mild cognitive impairment - Noted   OSA - Not on CPAP   COPD - Replace home Symbicort  formulary Dulera  - Continue as needed albuterol    DVT prophylaxis:      Eliquis  Code Status:         DNR Family Communication:   Daughter at bedside Disposition Plan: Home with hospice likely 1 to 2 days  Consultants:    Procedures:   Antimicrobials:    Objective: Vitals:   08/20/23 0400 08/20/23 0500 08/20/23 0732 08/20/23 0807  BP:  123/77 (!) 154/107   Pulse:  71 72   Resp:   17   Temp: 98.3 F (36.8 C)  97.6 F (36.4 C)   TempSrc: Oral  Oral   SpO2:  (!) 88% 91% (!) 89%  Weight:  45.2 kg    Height:        Intake/Output Summary (Last 24 hours) at 08/20/2023 1025 Last data filed at 08/20/2023 0900 Gross per 24 hour  Intake 300 ml  Output 450 ml  Net -150 ml   Filed Weights   08/18/23 0414 08/19/23 0700 08/20/23 0500  Weight: 46.7 kg 44.4 kg 45.2 kg    Examination:  General exam: Appears calm and comfortable  Respiratory system: Clear to auscultation Cardiovascular system: S1 & S2 heard, RRR.  Abd: nondistended, soft and nontender.Normal bowel sounds heard. Central nervous system: Alert and oriented. No focal neurological deficits. Extremities: no edema Skin: No rashes Psychiatry:  Mood & affect appropriate.     Data Reviewed:   CBC: Recent Labs  Lab 08/17/23 1014 08/18/23 0837  WBC 12.9* 15.9*  NEUTROABS 11.7*  --   HGB 13.0 12.8  HCT  37.2 35.3*  MCV 95.6 94.9  PLT 358 341   Basic Metabolic Panel: Recent Labs  Lab 08/17/23 1740 08/17/23 2301 08/18/23 0212 08/19/23 0228 08/20/23 0242  NA 125* 126* 124* 125* 127*  K 3.6 3.6 3.6 3.3* 4.7  CL 90* 89* 90* 88* 89*  CO2 23 26 25 27 26   GLUCOSE 134* 120* 100* 109* 83  BUN 12 13 13 15 17   CREATININE 0.78 0.80 0.69 0.78 0.84  CALCIUM  7.9* 8.1*  7.9* 8.4* 8.2*  MG 1.7  --   --   --   --    GFR: Estimated Creatinine Clearance: 35.3 mL/min (by C-G formula based on SCr of 0.84 mg/dL). Liver Function Tests: Recent Labs  Lab 08/17/23 1014  AST 90*  ALT 87*  ALKPHOS 64  BILITOT 1.9*  PROT 7.3  ALBUMIN  2.5*   No results for input(s): "LIPASE", "AMYLASE" in the last 168 hours. No results for input(s): "AMMONIA" in the last 168 hours. Coagulation Profile: No results for input(s): "INR", "PROTIME" in the last 168 hours. Cardiac Enzymes: No results for input(s): "CKTOTAL", "CKMB", "CKMBINDEX", "TROPONINI" in the last 168 hours. BNP (last 3 results) No results for input(s): "PROBNP" in the last 8760 hours. HbA1C: No results for input(s): "HGBA1C" in the last 72 hours. CBG: No results for input(s): "GLUCAP" in the last 168 hours. Lipid Profile: No results for input(s): "CHOL", "HDL", "LDLCALC", "TRIG", "CHOLHDL", "LDLDIRECT" in the last 72 hours. Thyroid  Function Tests: No results for input(s): "TSH", "T4TOTAL", "FREET4", "T3FREE", "THYROIDAB" in the last 72 hours. Anemia Panel: No results for input(s): "VITAMINB12", "FOLATE", "FERRITIN", "TIBC", "IRON", "RETICCTPCT" in the last 72 hours. Urine analysis:    Component Value Date/Time   COLORURINE YELLOW 08/19/2023 2119   APPEARANCEUR CLEAR 08/19/2023 2119   LABSPEC 1.008 08/19/2023 2119   PHURINE 6.0 08/19/2023 2119   GLUCOSEU NEGATIVE 08/19/2023 2119   HGBUR SMALL (A) 08/19/2023 2119   BILIRUBINUR NEGATIVE 08/19/2023 2119   KETONESUR NEGATIVE 08/19/2023 2119   PROTEINUR NEGATIVE 08/19/2023 2119   NITRITE NEGATIVE 08/19/2023 2119   LEUKOCYTESUR SMALL (A) 08/19/2023 2119   Sepsis Labs: @LABRCNTIP (procalcitonin:4,lacticidven:4)  )No results found for this or any previous visit (from the past 240 hours).   Radiology Studies: DG CHEST PORT 1 VIEW Result Date: 08/20/2023 CLINICAL DATA:  Hypoxia. EXAM: PORTABLE CHEST 1 VIEW COMPARISON:  08/17/2023 FINDINGS: Diffuse  interstitial and airspace disease suggests edema although underlying component of chronic interstitial lung disease suspected. Similar right pleural effusion with loculated fluid versus pleural thickening lateral right hemithorax. The cardio pericardial silhouette is enlarged. Bones are diffusely demineralized. Telemetry leads overlie the chest. IMPRESSION: 1. Diffuse interstitial and airspace disease suggests edema although underlying component of chronic interstitial lung disease suspected. 2. Similar right pleural effusion with loculated fluid versus pleural thickening lateral right hemithorax. Electronically Signed   By: Donnal Fusi M.D.   On: 08/20/2023 08:31      Scheduled Meds:  amiodarone   200 mg Oral Daily   apixaban   5 mg Oral BID   atorvastatin   40 mg Oral Daily   fluticasone furoate-vilanterol  1 puff Inhalation Daily   furosemide   60 mg Intravenous BID   levothyroxine   75 mcg Oral QAC breakfast   pantoprazole   40 mg Oral BID   sodium chloride  flush  3 mL Intravenous Q12H   spironolactone   25 mg Oral Daily   Continuous Infusions:  albumin  human        LOS: 2 days    Time spent:  Deforest Fast, MD [facility]   08/20/2023, 10:25 AM

## 2023-08-20 NOTE — Plan of Care (Signed)
   Problem: Education: Goal: Knowledge of General Education information will improve Description: Including pain rating scale, medication(s)/side effects and non-pharmacologic comfort measures Outcome: Not Progressing

## 2023-08-20 NOTE — Plan of Care (Signed)
  Problem: Education: Goal: Knowledge of General Education information will improve Description: Including pain rating scale, medication(s)/side effects and non-pharmacologic comfort measures 08/20/2023 1940 by Arlester Bence, RN Outcome: Not Progressing 08/20/2023 0735 by Arlester Bence, RN Outcome: Not Progressing

## 2023-08-20 NOTE — TOC Progression Note (Signed)
 Transition of Care [facility]) - Progression Note    Patient Details  Name: Laurie James MRN: [id number] Date of Birth: 1945-09-24  Transition of Care [facility]) CM/SW Contact  Jennett Model, RN Phone Number: 08/20/2023, 10:57 AM  Clinical Narrative:    NCM spoke with patient at the bedside, she states she will be going home to continue with Home Hospice with [facility], he spouse will be home with her to assist her.  She states she will need ambulance transport, she will need [facility].         Expected Discharge Plan and Services                                               Social Determinants of Health (SDOH) Interventions SDOH Screenings   Food Insecurity: No Food Insecurity (08/17/2023)  Housing: Low Risk  (08/17/2023)  Transportation Needs: No Transportation Needs (08/17/2023)  Utilities: Not At Risk (08/17/2023)  Social Connections: Unknown (08/17/2023)  Tobacco Use: Medium Risk (08/17/2023)    Readmission Risk Interventions    01/31/2023    3:16 PM 09/07/2022    3:25 PM  Readmission Risk Prevention Plan  Transportation Screening Complete Complete  PCP or Specialist Appt within 5-7 Days  Complete  PCP or Specialist Appt within 3-5 Days Complete   Home Care Screening  Complete  Medication Review (RN CM)  Complete  HRI or Home Care Consult Complete   Palliative Care Screening Not Applicable   Medication Review (RN Care Manager) Complete

## 2023-08-20 NOTE — Progress Notes (Signed)
 [facility] 3E[facility] Liaison Note?     [name] is a current [facility] hospice patient with a terminal diagnosis of abnormal weight loss. Husband alerted EMS due to decreased oxygen saturation readings. Husband informed [facility] that he wanted patient evaluated at hospital due to symptoms. EMS who took patient to the ED. She was admitted with diagnosis of acute respiratory failure with hypoxia. Per Dr. Jerlyn Moons with [facility] this is a related hospital admission.    Visited patient in hospital. She denies feeling short of breath or pain at this time. Reports that she is tired and sleepy. Noted increased O2 requirement this am and increased dose of Lasix  as well. Discussed plan of care with daughter at bedside and husband by phone. Will request delivery of Oxygen to home in preparation for discharge home with hospice services.     Patient is inpatient appropriate due to need for IV diuretics and assessment/treatment of hypoxia   Vital Signs:?97.6/70/18    122/77   91% 12L HFNC   I/O:  1272/1000   Abnormal labs:  NA 127, Chl 89, CA 8.2,    Diagnostics:  Portable chest xray: IMPRESSION: 1. Diffuse interstitial and airspace disease suggests edema although underlying component of chronic interstitial lung disease suspected. 2. Similar right pleural effusion with loculated fluid versus pleural thickening lateral right hemithorax.   IV/PRN Meds: Lasix  60mg  IV x2, Zofran  4mg  IV x2   Assessment/Plan: from progress note 5.12.25   Acute on chronic respiratory failure with hypoxia Acute on chronic combined CHF Pulmonary hypertension Tricuspid regurgitation > Last echo was in December 2024 with EF 50%, global hypokinesis, moderately reduced RV function, increased RV pressure, moderate to severe tricuspid regurgitation,+ pulmonary hypertension. -Readmitted with volume overload, followed by hospice but requested hospitalization for diuresis - Continue Lasix ,  increased dose to 60 Mg twice daily, continue Aldactone , repeat albumin  -Worsening hypoxia overnight, x-ray with interstitial edema in the background of chronic findings -Conservative management - Resume hospice services at discharge, possibly 1 to 2 days, discussed poor prognosis with patient and daughter   Hypervolemic Hyponatremia -In the setting of hypervolemia, monitor with diuretics, mental status is stable   Severe protein calorie malnutrition   Adult failure to thrive Severe debility, has been been bedbound for several months   Leukocytosis -Likely reactive, monitor   P Atrial fibrillation -She is in sinus rhythm at this time - Continue amiodarone  and Eliquis     CKD 3A > Creatinine stable - Trend renal function and electrolytes    OSA - Not on CPAP   COPD - Replace home Symbicort  formulary Dulera  - Continue as needed albuterol     Discharge Planning:?Home with hospice likely 1 to 2 days    Family contact: Spoke with daughter at bedside and husband, Laurie James by phone   IDT:? Updated?     Goals of Care: DNR   If patient requires EMS transport at discharge, please us  [facility] as that is who [facility] is contracted with for transport.   Please call with any hospice related questions or concerns.   Laurie James, BSN, RN, [facility] [phone]

## 2023-08-20 NOTE — Progress Notes (Signed)
 Upon arrival to patient room to give AM inhaler, patient's sats were reading 83-84% (with good waveform) on 7L [place].  Increased to 12L with sats now at 90%.  Will continue to monitor.

## 2023-08-20 NOTE — Progress Notes (Signed)
 Heart Failure Navigator Progress Note  Assessed for Heart & Vascular TOC clinic readiness.  Patient does not meet criteria due to established with Dr. Bensimhon and is an active hospice patient.   Navigator will sign off.   Jerilyn Monte, PharmD, BCPS Heart Failure Stewardship Pharmacist Phone [phone]

## 2023-08-20 NOTE — Plan of Care (Signed)
  Problem: Education: Goal: Knowledge of General Education information will improve Description: Including pain rating scale, medication(s)/side effects and non-pharmacologic comfort measures 08/20/2023 1940 by Arlester Bence, RN Outcome: Not Progressing 08/20/2023 1940 by Arlester Bence, RN Outcome: Not Progressing 08/20/2023 1940 by Arlester Bence, RN Outcome: Not Progressing 08/20/2023 0735 by Arlester Bence, RN Outcome: Not Progressing

## 2023-08-21 DIAGNOSIS — J9601 Acute respiratory failure with hypoxia: Secondary | ICD-10-CM | POA: Diagnosis not present

## 2023-08-21 LAB — BASIC METABOLIC PANEL WITH GFR
Anion gap: 14 (ref 5–15)
BUN: 20 mg/dL (ref 8–23)
CO2: 27 mmol/L (ref 22–32)
Calcium: 8.4 mg/dL — ABNORMAL LOW (ref 8.9–10.3)
Chloride: 89 mmol/L — ABNORMAL LOW (ref 98–111)
Creatinine, Ser: 0.98 mg/dL (ref 0.44–1.00)
GFR, Estimated: 59 mL/min — ABNORMAL LOW (ref >60)
Glucose, Bld: 93 mg/dL (ref 70–99)
Potassium: 4 mmol/L (ref 3.5–5.1)
Sodium: 130 mmol/L — ABNORMAL LOW (ref 135–145)

## 2023-08-21 MED ORDER — TORSEMIDE 20 MG PO TABS: 40.0000 mg | ORAL_TABLET | Freq: Every day | ORAL | Status: DC

## 2023-08-21 MED ORDER — FUROSEMIDE 10 MG/ML IJ SOLN: 80.0000 mg | Freq: Two times a day (BID) | INTRAMUSCULAR | Status: DC

## 2023-08-21 NOTE — Plan of Care (Signed)
  Problem: Education: Goal: Knowledge of General Education information will improve Description: Including pain rating scale, medication(s)/side effects and non-pharmacologic comfort measures Outcome: Adequate for Discharge Problem: Education: Goal: Knowledge of General Education information will improve Description: Including pain rating scale, medication(s)/side effects and non-pharmacologic comfort measures Outcome: Completed/Met

## 2023-08-21 NOTE — Care Management Important Message (Deleted)
 Important Message  Patient Details  Name: Laurie James MRN: [id number] Date of Birth: 1946/01/29   Important Message Given:  Yes - Medicare IM     Janith Melnick 08/21/2023, 10:51 AM

## 2023-08-21 NOTE — TOC Transition Note (Signed)
 Transition of Care [facility]) - Discharge Note   Patient Details  Name: Laurie James MRN: [id number] Date of Birth: 1946-03-08  [facility] Care [facility]) CM/SW Contact:  Jennett Model, RN Phone Number: 08/21/2023, 10:08 AM   Clinical Narrative:    For dc today, home with Hospice, [facility].  Shawn with [facility] has been notified, and the daughter at the bedside states the home oxygen was delivered yesterday.  NCM scheduled [facility] for transport.  Address confirmed with patient and daughter.          Patient Goals and CMS Choice            Discharge Placement                       Discharge Plan and Services Additional resources added to the After Visit Summary for                                       Social Drivers of Health (SDOH) Interventions SDOH Screenings   Food Insecurity: No Food Insecurity (08/17/2023)  Housing: Low Risk  (08/17/2023)  Transportation Needs: No Transportation Needs (08/17/2023)  Utilities: Not At Risk (08/17/2023)  Social Connections: Unknown (08/17/2023)  Tobacco Use: Medium Risk (08/17/2023)     Readmission Risk Interventions    01/31/2023    3:16 PM 09/07/2022    3:25 PM  Readmission Risk Prevention Plan  Transportation Screening Complete Complete  PCP or Specialist Appt within 5-7 Days  Complete  PCP or Specialist Appt within 3-5 Days Complete   Home Care Screening  Complete  Medication Review (RN CM)  Complete  HRI or Home Care Consult Complete   Palliative Care Screening Not Applicable   Medication Review (RN Care Manager) Complete

## 2023-08-21 NOTE — Discharge Summary (Signed)
 Physician Discharge Summary  Laurie James [place] NWG:[id number] DOB: March 20, 1946 DOA: 08/17/2023  PCP: Jearldine Mina, MD  Admit date: 08/17/2023 Discharge date: 08/21/2023  Time spent: 45 minutes  Recommendations for Outpatient Follow-up:  Resume home hospice services for end-of-life care  Discharge Diagnoses:  Principal Problem:   Acute respiratory failure with hypoxia (HCC) Acute on chronic systolic CHF Pulmonary hypertension Advanced COPD Severe malnutrition Adult failure to thrive   Acute on chronic combined systolic and diastolic CHF (congestive heart failure) (HCC)   Paroxysmal atrial fibrillation (HCC)   Chronic kidney disease, stage 3a (HCC)   COPD (chronic obstructive pulmonary disease) (HCC)   HLD (hyperlipidemia)   CAD (coronary artery disease)   MCI (mild cognitive impairment)   GERD without esophagitis   Hypothyroidism   Pulmonary HTN (HCC)   OSA (obstructive sleep apnea)   Gout   Discharge Condition: Guarded poor prognosis  Diet recommendation: Comfort  Filed Weights   08/19/23 0700 08/20/23 0500 08/21/23 0529  Weight: 44.4 kg 45.2 kg 44.6 kg    History of present illness:  77/F w COPD, chronic combined CHF, pulmonary hypertension, tricuspid regurgitation, A-fib, hypothyroidism, severe CAD, CKD 3 AA, mild cognitive impairment, severe malnutrition, currently followed by hospice presented to the ED with progressive dyspnea and edema.  In the ED mildly tachypneic, sodium 127, albumin  2.5, troponin 32, BNP 914, chest x-ray with extensive interstitial markings favored ILD with small to moderate right pleural effusion. - Admitted, started on diuretics - 5/11 AM, increased O2 needs overnight  Hospital Course:   Acute on chronic respiratory failure with hypoxia Acute on chronic combined CHF Pulmonary hypertension Tricuspid regurgitation > Last echo was in December 2024 with EF 50%, global hypokinesis, moderately reduced RV function, increased RV pressure,  moderate to severe tricuspid regurgitation,+ pulmonary hypertension. -Readmitted with volume overload, followed by hospice but requested hospitalization for diuresis - Diuresed with IV Lasix , Aldactone  and given acute doses of IV albumin  as well, limited response, mental status also has been poor and minimal to no oral intake. - After further discussion with patient's daughter decision made for discharge home with resumption of home hospice services for end-of-life care and no rehospitalization   Hypervolemic Hyponatremia -In the setting of hypervolemia   Severe protein calorie malnutrition   Adult failure to thrive Severe debility, has been been bedbound for several months   Leukocytosis -Likely reactive   P Atrial fibrillation -She is in sinus rhythm at this time - Continue amiodarone  and Eliquis    Hyperlipidemia - Continue atorvastatin    Hypothyroidism - Continue home Synthroid    GERD - Continue home PPI   CKD 3A > Creatinine stable - Trend renal function and electrolytes   CAD - Continue Eliquis , atorvastatin    Mild cognitive impairment - Noted   OSA - Not on CPAP   Advanced COPD - Replace home Symbicort  formulary Dulera  - Continue as needed albuterol   Discharge Exam: Vitals:   08/21/23 0812 08/21/23 1033  BP:  107/66  Pulse:  76  Resp:  20  Temp:  (!) 96.5 F (35.8 C)  SpO2: 92% (!) 89%   General exam: Appears calm and comfortable  Respiratory system: Clear to auscultation Cardiovascular system: S1 & S2 heard, RRR.  Abd: nondistended, soft and nontender.Normal bowel sounds heard. Central nervous system: Alert and oriented. No focal neurological deficits. Extremities: no edema Skin: No rashes Psychiatry:  Mood & affect appropriate.     Discharge Instructions   Discharge Instructions     Diet - low sodium  heart healthy   Complete by: As directed    Increase activity slowly   Complete by: As directed    No dressing needed   Complete by: As  directed       Allergies as of 08/21/2023       Reactions   Codeine Nausea And Vomiting, Other (See Comments)   Upsets the stomach        Medication List     TAKE these medications    albuterol  108 (90 Base) MCG/ACT inhaler Commonly known as: VENTOLIN  HFA Inhale 2 puffs into the lungs every 4 (four) hours as needed for wheezing.   amiodarone  200 MG tablet Commonly known as: PACERONE  Take 1 tablet (200 mg total) by mouth daily.   atorvastatin  40 MG tablet Commonly known as: LIPITOR Stop taking until you see your PCP and obtain repeat labwork. What changed:  how much to take how to take this when to take this   bisacodyl  10 MG suppository Commonly known as: DULCOLAX Place 10 mg rectally 2 (two) times daily as needed.   budesonide -formoterol  80-4.5 MCG/ACT inhaler Commonly known as: Symbicort  Inhale 2 puffs into the lungs in the morning and at bedtime.   Eliquis  2.5 MG Tabs tablet Generic drug: apixaban  Take 1 tablet by mouth twice daily   ipratropium-albuterol  0.5-2.5 (3) MG/3ML Soln Commonly known as: DUONEB Take 3 mLs by nebulization 4 (four) times daily as needed.   nitroGLYCERIN  0.4 MG SL tablet Commonly known as: NITROSTAT  Place 1 tablet (0.4 mg total) under the tongue every 5 (five) minutes x 3 doses as needed for chest pain.   ondansetron  4 MG tablet Commonly known as: ZOFRAN  Take 4 mg by mouth 3 (three) times daily as needed for nausea or vomiting.   pantoprazole  40 MG tablet Commonly known as: PROTONIX  Take 1 tablet (40 mg total) by mouth 2 (two) times daily.   senna 8.6 MG Tabs tablet Commonly known as: SENOKOT Take 2 tablets by mouth 2 (two) times daily as needed.   Synthroid  75 MCG tablet Generic drug: levothyroxine  Take 75 mcg by mouth daily before breakfast.   torsemide  20 MG tablet Commonly known as: DEMADEX  Take 2 tablets (40 mg total) by mouth daily. What changed: how much to take               Discharge Care  Instructions  (From admission, onward)           Start     Ordered   08/21/23 0000  No dressing needed        08/21/23 0759           Allergies  Allergen Reactions   Codeine Nausea And Vomiting and Other (See Comments)    Upsets the stomach      The results of significant diagnostics from this hospitalization (including imaging, microbiology, ancillary and laboratory) are listed below for reference.    Significant Diagnostic Studies: DG CHEST PORT 1 VIEW Result Date: 08/20/2023 CLINICAL DATA:  Hypoxia. EXAM: PORTABLE CHEST 1 VIEW COMPARISON:  08/17/2023 FINDINGS: Diffuse interstitial and airspace disease suggests edema although underlying component of chronic interstitial lung disease suspected. Similar right pleural effusion with loculated fluid versus pleural thickening lateral right hemithorax. The cardio pericardial silhouette is enlarged. Bones are diffusely demineralized. Telemetry leads overlie the chest. IMPRESSION: 1. Diffuse interstitial and airspace disease suggests edema although underlying component of chronic interstitial lung disease suspected. 2. Similar right pleural effusion with loculated fluid versus pleural thickening lateral right hemithorax. Electronically  Signed   By: Donnal Fusi M.D.   On: 08/20/2023 08:31   DG Chest Port 1 View Result Date: 08/17/2023 CLINICAL DATA:  Shortness of breath. EXAM: PORTABLE CHEST 1 VIEW COMPARISON:  03/24/2023. FINDINGS: Redemonstration of extensive increased interstitial markings throughout bilateral lungs without significant interval change, favoring underlying chronic interstitial lung disease. There is small-to-moderate right pleural effusion, which appears increased since the prior study. There is probable trace left pleural effusion, grossly unchanged. No pneumothorax. Stable cardio-mediastinal silhouette. No acute osseous abnormalities. The soft tissues are within normal limits. IMPRESSION: *Redemonstration of extensive  increased interstitial markings throughout bilateral lungs without significant interval change, favoring underlying chronic interstitial lung disease. *There is small-to-moderate right pleural effusion, which appears increased since the prior study. There is probable trace left pleural effusion, grossly unchanged. Electronically Signed   By: Beula Brunswick M.D.   On: 08/17/2023 10:38    Microbiology: No results found for this or any previous visit (from the past 240 hours).   Labs: Basic Metabolic Panel: Recent Labs  Lab 08/17/23 1740 08/17/23 2301 08/18/23 0212 08/19/23 0228 08/20/23 0242 08/21/23 0304  NA 125* 126* 124* 125* 127* 130*  K 3.6 3.6 3.6 3.3* 4.7 4.0  CL 90* 89* 90* 88* 89* 89*  CO2 23 26 25 27 26 27   GLUCOSE 134* 120* 100* 109* 83 93  BUN 12 13 13 15 17 20   CREATININE 0.78 0.80 0.69 0.78 0.84 0.98  CALCIUM  7.9* 8.1* 7.9* 8.4* 8.2* 8.4*  MG 1.7  --   --   --   --   --    Liver Function Tests: Recent Labs  Lab 08/17/23 1014  AST 90*  ALT 87*  ALKPHOS 64  BILITOT 1.9*  PROT 7.3  ALBUMIN  2.5*   No results for input(s): "LIPASE", "AMYLASE" in the last 168 hours. No results for input(s): "AMMONIA" in the last 168 hours. CBC: Recent Labs  Lab 08/17/23 1014 08/18/23 0837  WBC 12.9* 15.9*  NEUTROABS 11.7*  --   HGB 13.0 12.8  HCT 37.2 35.3*  MCV 95.6 94.9  PLT 358 341   Cardiac Enzymes: No results for input(s): "CKTOTAL", "CKMB", "CKMBINDEX", "TROPONINI" in the last 168 hours. BNP: BNP (last 3 results) Recent Labs    02/01/23 0430 03/24/23 1352 08/17/23 1045  BNP 327.6* 497.7* 914.7*    ProBNP (last 3 results) No results for input(s): "PROBNP" in the last 8760 hours.  CBG: No results for input(s): "GLUCAP" in the last 168 hours.     Signed:  Deforest Fast MD.  [facility] 08/21/2023, 11:40 AM

## 2023-08-21 NOTE — Care Management Important Message (Signed)
 Important Message  Patient Details  Name: Laurie James MRN: [id number] Date of Birth: 08/27/1945   Important Message Given:  Yes - Medicare IM     Janith Melnick 08/21/2023, 10:59 AM

## 2023-08-21 NOTE — Progress Notes (Signed)
 Pt discharged to home via [facility] with hospice . Pt's daughter took home blankets and other belongs discharge paperwork givne to daughter

## 2023-09-09 DEATH — deceased
